# Patient Record
Sex: Male | Born: 1945 | Race: White | Hispanic: No | Smoking: Former smoker
Health system: Southern US, Community
[De-identification: ages and names within clinical notes are randomized; demographics above are authoritative.]

## PROBLEM LIST (undated history)

## (undated) DIAGNOSIS — H3530 Unspecified macular degeneration: Secondary | ICD-10-CM

## (undated) DIAGNOSIS — Z860100 Personal history of colon polyps, unspecified: Secondary | ICD-10-CM

## (undated) DIAGNOSIS — R3911 Hesitancy of micturition: Secondary | ICD-10-CM

## (undated) DIAGNOSIS — M549 Dorsalgia, unspecified: Secondary | ICD-10-CM

## (undated) DIAGNOSIS — Z9889 Other specified postprocedural states: Secondary | ICD-10-CM

## (undated) DIAGNOSIS — N400 Enlarged prostate without lower urinary tract symptoms: Secondary | ICD-10-CM

## (undated) DIAGNOSIS — Z8719 Personal history of other diseases of the digestive system: Secondary | ICD-10-CM

## (undated) DIAGNOSIS — I480 Paroxysmal atrial fibrillation: Secondary | ICD-10-CM

## (undated) DIAGNOSIS — I499 Cardiac arrhythmia, unspecified: Secondary | ICD-10-CM

## (undated) DIAGNOSIS — I2510 Atherosclerotic heart disease of native coronary artery without angina pectoris: Secondary | ICD-10-CM

## (undated) DIAGNOSIS — G4730 Sleep apnea, unspecified: Secondary | ICD-10-CM

## (undated) DIAGNOSIS — M62830 Muscle spasm of back: Secondary | ICD-10-CM

## (undated) DIAGNOSIS — Z8711 Personal history of peptic ulcer disease: Secondary | ICD-10-CM

## (undated) DIAGNOSIS — G629 Polyneuropathy, unspecified: Secondary | ICD-10-CM

## (undated) DIAGNOSIS — M5030 Other cervical disc degeneration, unspecified cervical region: Secondary | ICD-10-CM

## (undated) DIAGNOSIS — K5900 Constipation, unspecified: Secondary | ICD-10-CM

## (undated) DIAGNOSIS — I4891 Unspecified atrial fibrillation: Secondary | ICD-10-CM

## (undated) DIAGNOSIS — T148XXA Other injury of unspecified body region, initial encounter: Secondary | ICD-10-CM

## (undated) DIAGNOSIS — G4700 Insomnia, unspecified: Secondary | ICD-10-CM

## (undated) DIAGNOSIS — M1990 Unspecified osteoarthritis, unspecified site: Secondary | ICD-10-CM

## (undated) DIAGNOSIS — R112 Nausea with vomiting, unspecified: Secondary | ICD-10-CM

## (undated) DIAGNOSIS — R7303 Prediabetes: Secondary | ICD-10-CM

## (undated) DIAGNOSIS — M2540 Effusion, unspecified joint: Secondary | ICD-10-CM

## (undated) DIAGNOSIS — M2550 Pain in unspecified joint: Secondary | ICD-10-CM

## (undated) DIAGNOSIS — G8929 Other chronic pain: Secondary | ICD-10-CM

## (undated) DIAGNOSIS — I82409 Acute embolism and thrombosis of unspecified deep veins of unspecified lower extremity: Secondary | ICD-10-CM

## (undated) DIAGNOSIS — K219 Gastro-esophageal reflux disease without esophagitis: Secondary | ICD-10-CM

## (undated) DIAGNOSIS — R531 Weakness: Secondary | ICD-10-CM

## (undated) HISTORY — PX: APPENDECTOMY: SHX54

## (undated) HISTORY — PX: EYE SURGERY: SHX253

## (undated) HISTORY — PX: POSTERIOR LUMBAR FUSION: SHX6036

## (undated) HISTORY — PX: JOINT REPLACEMENT: SHX530

## (undated) HISTORY — PX: CARDIAC CATHETERIZATION: SHX172

## (undated) HISTORY — PX: SHOULDER ARTHROSCOPY: SHX128

## (undated) HISTORY — DX: Atherosclerotic heart disease of native coronary artery without angina pectoris: I25.10

## (undated) HISTORY — PX: CARPAL TUNNEL RELEASE: SHX101

## (undated) HISTORY — PX: ANTERIOR CERVICAL DECOMP/DISCECTOMY FUSION: SHX1161

## (undated) HISTORY — PX: ANTERIOR CERVICAL DISCECTOMY: SHX1160

## (undated) HISTORY — PX: BACK SURGERY: SHX140

---

## 2002-12-28 ENCOUNTER — Encounter: Payer: Self-pay | Admitting: Emergency Medicine

## 2002-12-28 ENCOUNTER — Emergency Department (HOSPITAL_COMMUNITY): Admission: EM | Admit: 2002-12-28 | Discharge: 2002-12-28 | Payer: Self-pay | Admitting: Emergency Medicine

## 2003-01-06 ENCOUNTER — Ambulatory Visit (HOSPITAL_COMMUNITY): Admission: RE | Admit: 2003-01-06 | Discharge: 2003-01-06 | Payer: Self-pay | Admitting: Family Medicine

## 2003-01-06 ENCOUNTER — Encounter: Payer: Self-pay | Admitting: Family Medicine

## 2004-11-24 ENCOUNTER — Emergency Department (HOSPITAL_COMMUNITY): Admission: EM | Admit: 2004-11-24 | Discharge: 2004-11-24 | Payer: Self-pay | Admitting: Emergency Medicine

## 2005-02-18 ENCOUNTER — Ambulatory Visit (HOSPITAL_COMMUNITY): Admission: RE | Admit: 2005-02-18 | Discharge: 2005-02-18 | Payer: Self-pay | Admitting: Family Medicine

## 2005-02-18 ENCOUNTER — Emergency Department (HOSPITAL_COMMUNITY): Admission: AD | Admit: 2005-02-18 | Discharge: 2005-02-18 | Payer: Self-pay | Admitting: Family Medicine

## 2005-08-21 ENCOUNTER — Encounter: Admission: RE | Admit: 2005-08-21 | Discharge: 2005-08-21 | Payer: Self-pay | Admitting: Cardiovascular Disease

## 2005-08-27 ENCOUNTER — Ambulatory Visit (HOSPITAL_COMMUNITY): Admission: RE | Admit: 2005-08-27 | Discharge: 2005-08-27 | Payer: Self-pay | Admitting: Cardiovascular Disease

## 2005-09-17 ENCOUNTER — Ambulatory Visit (HOSPITAL_COMMUNITY): Admission: RE | Admit: 2005-09-17 | Discharge: 2005-09-17 | Payer: Self-pay | Admitting: Family Medicine

## 2005-10-12 ENCOUNTER — Encounter: Admission: RE | Admit: 2005-10-12 | Discharge: 2005-10-12 | Payer: Self-pay | Admitting: Neurosurgery

## 2005-11-07 ENCOUNTER — Ambulatory Visit (HOSPITAL_COMMUNITY): Admission: RE | Admit: 2005-11-07 | Discharge: 2005-11-07 | Payer: Self-pay | Admitting: Neurosurgery

## 2005-11-18 ENCOUNTER — Emergency Department (HOSPITAL_COMMUNITY): Admission: EM | Admit: 2005-11-18 | Discharge: 2005-11-18 | Payer: Self-pay | Admitting: Emergency Medicine

## 2005-12-12 ENCOUNTER — Inpatient Hospital Stay (HOSPITAL_COMMUNITY): Admission: RE | Admit: 2005-12-12 | Discharge: 2005-12-15 | Payer: Self-pay | Admitting: Neurosurgery

## 2005-12-21 ENCOUNTER — Encounter: Admission: RE | Admit: 2005-12-21 | Discharge: 2005-12-21 | Payer: Self-pay | Admitting: Neurosurgery

## 2006-11-18 ENCOUNTER — Ambulatory Visit: Payer: Self-pay | Admitting: Oncology

## 2007-04-17 ENCOUNTER — Encounter: Admission: RE | Admit: 2007-04-17 | Discharge: 2007-04-17 | Payer: Self-pay | Admitting: Neurosurgery

## 2008-02-27 ENCOUNTER — Inpatient Hospital Stay (HOSPITAL_COMMUNITY): Admission: RE | Admit: 2008-02-27 | Discharge: 2008-02-29 | Payer: Self-pay | Admitting: Neurosurgery

## 2009-01-13 ENCOUNTER — Inpatient Hospital Stay (HOSPITAL_COMMUNITY): Admission: AD | Admit: 2009-01-13 | Discharge: 2009-01-17 | Payer: Self-pay | Admitting: Orthopaedic Surgery

## 2011-04-02 LAB — CBC
HCT: 36.5 % — ABNORMAL LOW (ref 39.0–52.0)
HCT: 40.6 % (ref 39.0–52.0)
Hemoglobin: 12.7 g/dL — ABNORMAL LOW (ref 13.0–17.0)
Hemoglobin: 13.9 g/dL (ref 13.0–17.0)
MCHC: 34.2 g/dL (ref 30.0–36.0)
MCHC: 34.9 g/dL (ref 30.0–36.0)
MCV: 92.3 fL (ref 78.0–100.0)
MCV: 93.6 fL (ref 78.0–100.0)
Platelets: 287 10*3/uL (ref 150–400)
Platelets: 293 10*3/uL (ref 150–400)
RBC: 3.95 MIL/uL — ABNORMAL LOW (ref 4.22–5.81)
RBC: 4.33 MIL/uL (ref 4.22–5.81)
RDW: 12.1 % (ref 11.5–15.5)
RDW: 12.3 % (ref 11.5–15.5)
WBC: 5.6 10*3/uL (ref 4.0–10.5)
WBC: 6 10*3/uL (ref 4.0–10.5)

## 2011-04-02 LAB — BASIC METABOLIC PANEL
BUN: 10 mg/dL (ref 6–23)
BUN: 16 mg/dL (ref 6–23)
BUN: 7 mg/dL (ref 6–23)
CO2: 26 mEq/L (ref 19–32)
CO2: 27 mEq/L (ref 19–32)
CO2: 27 mEq/L (ref 19–32)
Calcium: 9 mg/dL (ref 8.4–10.5)
Calcium: 9.5 mg/dL (ref 8.4–10.5)
Calcium: 9.5 mg/dL (ref 8.4–10.5)
Chloride: 102 mEq/L (ref 96–112)
Chloride: 102 mEq/L (ref 96–112)
Chloride: 106 mEq/L (ref 96–112)
Creatinine, Ser: 0.64 mg/dL (ref 0.4–1.5)
Creatinine, Ser: 0.72 mg/dL (ref 0.4–1.5)
Creatinine, Ser: 0.75 mg/dL (ref 0.4–1.5)
GFR calc Af Amer: >60 mL/min (ref >60)
GFR calc Af Amer: >60 mL/min (ref >60)
GFR calc Af Amer: >60 mL/min (ref >60)
GFR calc non Af Amer: >60 mL/min (ref >60)
GFR calc non Af Amer: >60 mL/min (ref >60)
GFR calc non Af Amer: >60 mL/min (ref >60)
Glucose, Bld: 103 mg/dL — ABNORMAL HIGH (ref 70–99)
Glucose, Bld: 108 mg/dL — ABNORMAL HIGH (ref 70–99)
Glucose, Bld: 144 mg/dL — ABNORMAL HIGH (ref 70–99)
Potassium: 3.7 mEq/L (ref 3.5–5.1)
Potassium: 3.8 mEq/L (ref 3.5–5.1)
Potassium: 3.8 mEq/L (ref 3.5–5.1)
Sodium: 139 mEq/L (ref 135–145)
Sodium: 139 mEq/L (ref 135–145)
Sodium: 141 mEq/L (ref 135–145)

## 2011-04-02 LAB — DIFFERENTIAL
Basophils Absolute: 0 10*3/uL (ref 0.0–0.1)
Basophils Absolute: 0 10*3/uL (ref 0.0–0.1)
Basophils Relative: 0 % (ref 0–1)
Basophils Relative: 0 % (ref 0–1)
Eosinophils Absolute: 0 10*3/uL (ref 0.0–0.7)
Eosinophils Absolute: 0.1 10*3/uL (ref 0.0–0.7)
Eosinophils Relative: 0 % (ref 0–5)
Eosinophils Relative: 2 % (ref 0–5)
Lymphocytes Relative: 22 % (ref 12–46)
Lymphocytes Relative: 6 % — ABNORMAL LOW (ref 12–46)
Lymphs Abs: 0.4 10*3/uL — ABNORMAL LOW (ref 0.7–4.0)
Lymphs Abs: 1.3 10*3/uL (ref 0.7–4.0)
Monocytes Absolute: 0.3 10*3/uL (ref 0.1–1.0)
Monocytes Absolute: 0.6 10*3/uL (ref 0.1–1.0)
Monocytes Relative: 11 % (ref 3–12)
Monocytes Relative: 5 % (ref 3–12)
Neutro Abs: 3.6 10*3/uL (ref 1.7–7.7)
Neutro Abs: 5.3 10*3/uL (ref 1.7–7.7)
Neutrophils Relative %: 65 % (ref 43–77)
Neutrophils Relative %: 89 % — ABNORMAL HIGH (ref 43–77)

## 2011-04-02 LAB — URINALYSIS, ROUTINE W REFLEX MICROSCOPIC
Bilirubin Urine: NEGATIVE
Glucose, UA: NEGATIVE mg/dL
Hgb urine dipstick: NEGATIVE
Ketones, ur: NEGATIVE mg/dL
Nitrite: NEGATIVE
Protein, ur: NEGATIVE mg/dL
Specific Gravity, Urine: >1.03 — ABNORMAL HIGH (ref 1.005–1.030)
Urobilinogen, UA: 0.2 mg/dL (ref 0.0–1.0)
pH: 5.5 (ref 5.0–8.0)

## 2011-04-02 LAB — SEDIMENTATION RATE: Sed Rate: 59 mm/hr — ABNORMAL HIGH (ref 0–16)

## 2011-04-02 LAB — VANCOMYCIN, RANDOM: Vancomycin Rm: 5.6 ug/mL

## 2011-05-01 NOTE — Op Note (Signed)
[name], Christopher [name]                 ACCOUNT NO.:  [id number]   MEDICAL RECORD NO.:  [id number]          PATIENT TYPE:  OIB   LOCATION:  3599                         FACILITY:  [facility]   PHYSICIAN:  Hilda Lias, M.D.   DATE OF BIRTH:  [birth year]   DATE OF PROCEDURE:  02/27/2008  DATE OF DISCHARGE:                               OPERATIVE REPORT   PREOPERATIVE DIAGNOSIS:  C3-C4 degenerative disk disease with stenosis  and chronic radiculopathy status post fusion of C4 down to C7.   POSTOPERATIVE DIAGNOSIS:  C3-C4 degenerative disk disease with stenosis  and chronic radiculopathy status post fusion of C4 down to C7.   PROCEDURE:  Anterior C3-C4 diskectomy, decompression of the spinal cord,  foraminotomy, interbody fusion with allograft and autograft plate,  microscopy.   SURGEON:  Hilda Lias, M.D.   ASSISTANT:  Payton Doughty, M.D.   CLINICAL HISTORY:  The patient is admitted because of neck pain  radiating into the shoulder.  Clinically, he has no weakness but pain  with movement of the shoulder.  X-ray showed fusion from C4 down to C6-7  with severe degenerative disk disease at the level of 3-4 with bilateral  foraminal stenosis.  Surgery was advised, and the risks were explained  to him  in the history and physical.   PROCEDURE:  The patient was taken to the OR and after intubation, the  left side of the neck was cleaned with DuraPrep.  A transverse incision  was done through the skin, subcutaneous tissue, and platysma down to the  cervical spine.  X-rays showed that we were at the C3-C4.  With the  microscope, we opened the anterior ligament and total diskectomy was  achieved.  The area was quite narrow.  The posterior ligament was  adherent to the dura mater, and lysis was achieved.  At the end, we were  able to work between the posterior ligament and the dura mater doing  decompression of the spinal cord as well as foraminotomy.  The endplates  were drilled, and allograft  with autograft in the middle was inserted.  Then a plate using four screws was done.  The lateral cervical spine  showed good position of the bone graft and the plate.  Although we had  good hemostasis, nevertheless, because he had previous surgery and  multiple scars, a drain was left in the precervical area.  The wound was  closed with Vicryl and Steri-Strips.           ______________________________  Hilda Lias, M.D.     EB/MEDQ  D:  02/27/2008  T:  02/28/2008  Job:  [id number]

## 2011-05-01 NOTE — H&P (Signed)
Christopher [name], Christopher [name]                 ACCOUNT NO.:  [id number]   MEDICAL RECORD NO.:  [id number]          PATIENT TYPE:  INP   LOCATION:  A305                          FACILITY:  [facility]   PHYSICIAN:  J. Darreld Mclean, M.D. DATE OF BIRTH:  [birth year]   DATE OF ADMISSION:  01/13/2009  DATE OF DISCHARGE:  LH                              HISTORY & PHYSICAL   CHIEF COMPLAINT:  My elbow is swollen, it hurts.   The patient is a 65 year old male with pain and tenderness in his right  elbow.  I first saw him in the office on January 12, 2009,  at the  request of Dr. Wende Crease.  The patient has swelling in his elbow over the  last weekend and was seen by Dr. Wende Crease on January 10, 2009.  __________ cellulitis of the elbow and bursitis and was given IV  Rocephin.  He returned the following day and again, the next day and he  had asked me to see the patient.  The redness has gotten better and he  had been placed on Augmentin.  The patient was afebrile and a full range  of motion.  He had some tenderness to the elbow.  He had obvious  effusion of the olecranon bursa and aspirated approximately 3-4 mL and  sent that for culture.  Following morning on the day of admission, the  patient was having nausea and complaining of a fever, had a temperature  of 100.4, increased redness to his elbow and pain.  I saw him in the  office.  He was having significant nausea as well.  He could not take  his p.o. medication.  I elected to admit him.  I offered outpatient  therapy, but we are going to be having rather significant snowstorm  beginning tomorrow and it will be very difficult for him to get  transportation back and forth to the hospital.  He called earlier this  morning to the office complaining about his elbow hurting more, but it  took him a considerable amount of time to get to the office, in fact it  was late afternoon before he could come in to get a ride.  It would be  safer and better to have him  admitted to the hospital for IV  antibiotics.  This has not improved with conservative treatment.   PREVIOUS SURGERIES:  He had neck surgery in 2007 and 2008, upper back  surgery in 1991, bilateral carpal tunnel in 2008 and 2009, and  appendectomy in 1958.   He has no allergies.   CURRENT MEDICATIONS:  Nexium and ibuprofen, but he also has taken Mobic  in the past.   The patient does not smoke and he uses alcoholic beverages socially.   He has a history of ulcerative disease and circulatory problems.   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  Diabetes run in the family.  He is  married and lives in [place].   PHYSICAL EXAMINATION:  VITAL SIGNS:  BP is 100/78, height 5 feet 7,  weight 157, pulse 68, respirations 16, and a temperature of  100.7.  GENERAL:  He is alert, cooperative, and oriented.  HEENT:  Negative.  NECK:  Supple.  LUNGS: Clear to P&A.  HEART:  Regular rhythm without murmur heard.  ABDOMEN: Soft, nontender.  EXTREMITIES:  Right elbow is kind of swollen and tender, olecranon bursa  with redness.  Range of motion of the elbow is full, but I had to coax  some to get full extension.  Supination, pronation as well but I have to  coax him to do that.  There is redness around the olecranon area on the  proximal forearm and the lower distal arm.  Other extremities are  negative.  CNS: Intact.  SKIN:  Intact.   IMPRESSION:  Olecranon bursitis and cellulitis of the right elbow.   PLAN:  Admission, IV antibiotics.  I talked to him and his wife that he  will be in hospital for several days until we get the infection cleared.  Also treat him for his nausea.  Other labs are pending.                                            ______________________________  J. Darreld Mclean, M.D.     JWK/MEDQ  D:  01/14/2009  T:  01/14/2009  Job:  [id number]

## 2011-05-01 NOTE — H&P (Signed)
NAMEWILMAR, PRABHAKAR                 ACCOUNT NO.:  [id number]   MEDICAL RECORD NO.:  [id number]          PATIENT TYPE:  OIB   LOCATION:  3599                         FACILITY:  [facility]   PHYSICIAN:  Hilda Lias, M.D.   DATE OF BIRTH:  12-17-1946   DATE OF ADMISSION:  02/27/2008  DATE OF DISCHARGE:                              HISTORY & PHYSICAL   Mr. Mitton is a gentleman who in the past underwent anterior cervical  diskectomy because of spondylosis.  The patient had surgery which  involved fusion at the level of C4-5, C5-6 and C6-7.  The patient had  been doing fairly well but lately he had been complaining of neck pain  with radiation to the shoulder.  There is no weakness.  The pain is not  any better.  We did conservative treatment without any improvement.  Because of that we went ahead and we did some x-rays, which showed that  indeed he has a severe case of degenerative disk disease at the level of  C3-4.  There was also narrowing of the foramen at the level of C7-T1 but  the patient does not have any C8 radiculopathy.   PAST MEDICAL HISTORY:  1. Anterior cervical fusion.  2. Carpal tunnel surgery.   The patient is not allergic to medication except MORPHINE.   He does not smoke.  He drinks socially.   FAMILY HISTORY:  Unremarkable.   REVIEW OF SYSTEMS:  Positive for neck pain.   PHYSICAL EXAMINATION:  HEAD, EARS, NOSE AND THROAT:  Normal.  NECK:  There is a limitation of flexibility with anterior scar.  LUNGS:  Clear.  Heart sounds normal.  EXTREMITIES:  Normal pulses.  NEUROLOGIC:  Mental status and cranial nerves are normal.  Strength is  normal.  Coordination normal.   Cervical spine x-rays show severe degenerative disk disease with  spondylosis and a herniated disk at the level of C3-4.  The fusion  between C3-4, C4-5 and C6-7 is solid.   CLINICAL IMPRESSION:  C3-4 spondylosis and stenosis.   RECOMMENDATIONS:  The patient is being admitted for surgery.  The  procedure will be anterior cervical diskectomy at the level of C3-4  using allograft at his own request.  He knows about the risks such as  infection, CSF leak, worsening pain and difficulty swallowing and need  for further surgery, which may require a posterior approach.           ______________________________  Hilda Lias, M.D.     EB/MEDQ  D:  02/27/2008  T:  02/28/2008  Job:  [id number]

## 2011-05-04 NOTE — Op Note (Signed)
[name], Christopher [name]                 ACCOUNT NO.:  [id number]   MEDICAL RECORD NO.:  [id number]          PATIENT TYPE:  INP   LOCATION:  3008                         FACILITY:  [facility]   PHYSICIAN:  Hilda Lias, M.D.   DATE OF BIRTH:  December 10, 1946   DATE OF PROCEDURE:  12/12/2005  DATE OF DISCHARGE:                                 OPERATIVE REPORT   PREOPERATIVE DIAGNOSIS:  C5-C6 pseudoarthrosis, C6-C7 stenosis and  spondylosis with chronic radiculopathy, left carpal tunnel syndrome.   POSTOPERATIVE DIAGNOSIS:  C5-C6 pseudoarthrosis, C6-C7 stenosis and  spondylosis with chronic radiculopathy, left carpal tunnel syndrome.   PROCEDURE:  1.  Anterior C5-C6 decompression and interbody fusion, C6-C7 discectomy,      decompression of the spinal cord, interbody fusion plate from C5 to C7.  2.  Left carpal tunnel decompression.   ANESTHESIA:  General.   SURGEON:  Hilda Lias, M.D.   ASSISTANT:  Clydene Fake, M.D., for the first procedure.   CLINICAL HISTORY:  The patient was admitted because of neck pain with  radiation to both upper extremities.  Many years ago, he underwent fusion of  5-6 and 4-5.  4-5 healed completely but at the level of 5-6 he developed  pseudoarthrosis.  X-ray shows pseudoarthrosis at the level of 5-6 with  spondylosis and stenosis below C6 and C7.  Previously, he had decompression  of the right median nerve about six weeks ago and now he has come in to have  not only the neck but also the hand.  The risks were explained during the  history and physical.   PROCEDURE:  The patient was taken to the OR and after intubation, the neck  was prepped.  A longitudinal incision following the previous one was made,  retraction was carried out.  The patient had quite a bit of adhesions and  lysis was accomplished.  X-ray showed that we were at the level of 5-6.  Then, with the microscope, we opened the anterior ligament of 5-6 and 6-7.  At the level of C6-C7, we did a  total discectomy with decompression of the  spinal cord and doing foraminotomy.  The patient had quite a bit of  adhesions but the worse was at the level of 5-6 where there was a lot of  scar tissue.  At the end, we had a good decompression of the C6 nerve root  bilaterally.  There was an area that CSF was coming when we tried to dissect  the dura matter.  Although the CSF stopped at the end of the procedure,  nevertheless, we used BioGlue.  From then on, the endplate was drilled.  Two  allograft with autograft inside were inserted.  Once was 6 mm between 5-6  and 7 mm between 6-7.  This was followed by the plate with screws.  X-ray  showed good alignment and good position of the graft of the screws and the  plate.  From then on, the area was irrigated.  Since we had quite a bit of  dissection, I used H2O2 through NG tube into the esophagus,  there was no  evidence of any leakage in the esophagus.  We waited ten minutes, we were  sure there was not any bleeding.  The area was irrigated and the wound was  closed with Vicryl and Steri-Strips.   We  now proceeded with the second procedure.  The left hand was prepped up  to the elbow.  An incision following the base of the thumb was made through  the skin, through the volar ligament, revealing a thick, calcified ligament.  The carpal ligament was dissected along the ulnar aspect of the nerve distal  and proximal.  At the end, we had a good decompression of the median nerve.  The area was irrigated, hemostasis was done with bipolar, and the wound was  closed with nylon.           ______________________________  Hilda Lias, M.D.     EB/MEDQ  D:  12/12/2005  T:  12/12/2005  Job:  [id number]

## 2011-05-04 NOTE — H&P (Signed)
Christopher [name], Christopher [name]                 ACCOUNT NO.:  [id number]   MEDICAL RECORD NO.:  [id number]          PATIENT TYPE:  INP   LOCATION:  2869                         FACILITY:  [facility]   PHYSICIAN:  Hilda Lias, M.D.   DATE OF BIRTH:  Feb 14, 1946   DATE OF ADMISSION:  12/12/2005  DATE OF DISCHARGE:                                HISTORY & PHYSICAL   [name] is a gentleman who came to see me because of neck pain with  radiation to both upper extremities.  The work-up showed that he had  degenerative disk disease as well as carpal tunnel syndrome bilaterally.  Previously he had effusion at the level 5-6 and at the level 4-5.  He  underwent decompression of the right median nerve and now he is ready to go  ahead with the cervical as well as the left carpal tunnel syndrome repair to  be done all in one setting.   PAST MEDICAL HISTORY:  Anterior cervical diskectomy 5-6 and 4-5.   He is not allergic to any medications.   He drinks socially.  Does not smoke.   FAMILY HISTORY:  Unremarkable.   PHYSICAL EXAMINATION:  GENERAL:  Patient came to my office and had  difficulty with the left hand, he kept shaking the left hand, and also with  neck pain.  HEENT:  Normal.  NECK:  There is a scar anteriorly.  He has had decrease of flexibility of  cervical spine.  LUNGS:  Clear.  HEART:  Normal.  ABDOMEN:  Normal.  EXTREMITIES:  There is a scar on the right hand from previous surgery.  NEUROLOGIC:  Mental status normal.  Cranial nerves normal.  Strength:  I can  break both biceps and a little bit the left triceps.  Reflexes 1+.  No  Babinski.   The cervical x-ray showed that he has spondylosis which is at the level 5-  6/6-7.  The EMG and nerve conduction tests show that indeed he has a chronic  C7 radiculopathy.   CLINICAL IMPRESSION:  Cervical spondylosis 5-6, 6-7.  Left carpal tunnel  syndrome.   RECOMMENDATIONS:  The patient is being admitted for surgery.  The procedure  will be a  decompression at the level 5-6, 6-7 and to follow with  decompression of the median nerve.  He knows all the risks such as  infection, CSF leak, worsening pain, paralysis, damage to the vocal cord,  and no improvement whatsoever because of the chronicity of the pain.           ______________________________  Hilda Lias, M.D.     EB/MEDQ  D:  12/12/2005  T:  12/12/2005  Job:  [id number]

## 2011-05-04 NOTE — Cardiovascular Report (Signed)
Christopher [name], Christopher [name]                 ACCOUNT NO.:  [id number]   MEDICAL RECORD NO.:  [id number]          PATIENT TYPE:  AMB   LOCATION:  SDS                          FACILITY:  [facility]   PHYSICIAN:  Nanetta Batty, M.D.   DATE OF BIRTH:  1946-05-16   DATE OF PROCEDURE:  08/27/2005  DATE OF DISCHARGE:                              CARDIAC CATHETERIZATION   Mr. Lybrand is a 65 year old Caucasian male referred for chest and arm pain by  Dr. Metta Clines.  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  Cardiac stress test revealed no evidence of ischemia and  he had normal LV function by two-dimensional echocardiogram.  He presents  now for arch angiography, distal abdominal aortography, and selective right  subclavian artery angiography.   DESCRIPTION OF PROCEDURE:  The patient is brought to the sixth floor [facility] in the postabsorptive state.  He  was premedicated with p.o. Valium.  His right groin was prepped and shaved  in the usual sterile fashion.  1% Xylocaine was used for local anesthesia.  A 5 French sheath was inserted into the right femoral artery using standard  Seldinger technique.  5 [place] tennis-racquet catheter as well as JV1  catheter were used for arch angiography, distal abdominal aortography, and  selective right subclavian artery angiography.  Visipaque dye was used for  the entirety of the case.  Retrograde aortic pressure was monitored at the  end of the case.   1.  Arch aortogram; Type 1 arch without evidence of arch vessel disease.  2.  Right subclavian artery; widely patent.  3.  Abdominal aortogram;      1.  Renal arteries - normal.      2.  Infrarenal abdominal aorta and iliac bifurcation were free of          significant disease.   IMPRESSION:  Mr. Lina has normal right subclavian artery and abdominal aorta  with iliac  arteries.  I am unsure why his right upper extremity is weak and  painful as well as the fact that he had an abnormal Doppler study.  However,  his angiogram is completely unremarkable.  Plans will be for continued  medical therapy.   The sheath was removed and pressure was held in the groin to achieve  hemostasis.  The patient left the lab in stable condition.  He will be  discharged home today as an outpatient after being hydrated and will see me  back in the office in several weeks for follow-up.      Nanetta Batty, M.D.  Electronically Signed    JB/MEDQ  D:  08/27/2005  T:  08/27/2005  Job:  [id number]   cc:   [facility]   Kemper Durie, M.D., [place] [facility]

## 2011-05-04 NOTE — Op Note (Signed)
NAMECARSYN, TAUBMAN                 ACCOUNT NO.:  [id number]   MEDICAL RECORD NO.:  [id number]          PATIENT TYPE:  AMB   LOCATION:  SDS                          FACILITY:  [facility]   PHYSICIAN:  Hilda Lias, M.D.   DATE OF BIRTH:  [birth year]   DATE OF PROCEDURE:  11/07/2005  DATE OF DISCHARGE:  11/07/2005                                 OPERATIVE REPORT   PREOPERATIVE DIAGNOSIS:  Right carpal tunnel syndrome.   POSTOPERATIVE DIAGNOSIS:  Right carpal tunnel syndrome.   PROCEDURE:  Decompression of the right median nerve.   CLINICAL HISTORY:  The patient was admitted because of arm pain associated  with weakness.  The patient has a positive nerve conduction test.  Surgery  was advised.  The risks were explained to him, including the possibility of  infection and no improvement whatsoever.   PROCEDURE:  The patient was taken to the OR, and the right hand was prepped  with Betadine.  Infiltration at the base of the thumb was made.  An in  incision was done following the base line of the thumb.  It was carried out  through the skin, the volar ligament, and through a thick, calcified  ligament.  Decompression medial and distal along the ulnar aspect of the  nerve was accomplished.  At the end we have a good plenty of space of the  median nerve.  Hemostasis was done.  The area was irrigated.  The wound was  closed with nylon and the patient is going to go to PACU and discharged once  he is stable.           ______________________________  Hilda Lias, M.D.     EB/MEDQ  D:  11/07/2005  T:  11/07/2005  Job:  [id number]

## 2011-05-04 NOTE — Discharge Summary (Signed)
NAMEMILO, Christopher [name]                 ACCOUNT NO.:  [id number]   MEDICAL RECORD NO.:  [id number]          PATIENT TYPE:  INP   LOCATION:  A305                          FACILITY:  [facility]   PHYSICIAN:  J. Darreld Mclean, M.D. DATE OF BIRTH:  08-Aug-1946   DATE OF ADMISSION:  01/13/2009  DATE OF DISCHARGE:  02/01/2010LH                               DISCHARGE SUMMARY   DISCHARGE DIAGNOSES:  1. Cellulitis of the right olecranon.  2. Right olecranon bursitis.   PROCEDURE PERFORMED:  Placement of a PICC line.   DISCHARGE MEDICATIONS:  1. Vancomycin daily IV per therapy scheduling and dosing.  2. Mobic 50 mg daily.  3. Nexium 40 mg daily.   He is to be seen by me in my office on Thursday after discharge.   DISCHARGE STATUS:  Improved.   PROGNOSIS:  Good.   Patient developed cellulitis of his right elbow.  He was seen by Dr.  Chilton Si on January 10, 2009, and then again on January 12, 2009.  I saw  the patient on January 12, 2009 at the request of Dr. Chilton Si.  Initially,  the wound looked good but it got redder and redder and looked to have  him admitted because he was not responding to p.o. antibiotics.  Patient  was admitted and a PICC line inserted.  Vancomycin was given.  After the  PICC line was inserted, the following day he had less erythema, however,  we had 8 inches of snow and he had no way to go home.  Patient stayed in  the hospital on continuation of his IV antibiotics for the next couple  days and was discharged home on January 17, 2009.  I aspirated 3 mL from  the elbow at that time.  Cultures came back growing Staph aureus.  Other  labs were negative.  We will see the patient as stated.  If he gets  worse, any difficulties, contact me through office hospital beeper  system.                                            ______________________________  Shela Commons. Darreld Mclean, M.D.     JWK/MEDQ  D:  02/01/2009  T:  02/01/2009  Job:  [id number]

## 2011-05-04 NOTE — Discharge Summary (Signed)
NAMESHEENA, [name]                 ACCOUNT NO.:  [id number]   MEDICAL RECORD NO.:  [id number]          PATIENT TYPE:  INP   LOCATION:  3008                         FACILITY:  [facility]   PHYSICIAN:  Hilda Lias, M.D.   DATE OF BIRTH:  [birth year]   DATE OF ADMISSION:  12/12/2005  DATE OF DISCHARGE:  12/15/2005                                 DISCHARGE SUMMARY   ADMISSION DIAGNOSES:  1.  C5-C6, C6-C7 degenerative disk disease with chronic radiculopathy.  2.  Left carpal tunnel syndrome.   POSTOPERATIVE DIAGNOSES:  1.  C5-C6, C6-C7 degenerative disk disease with chronic radiculopathy.  2.  Left carpal tunnel syndrome.   CLINICAL HISTORY:  The patient was admitted because of neck pain associated  with __________ and also carpal tunnel syndrome on the left side.  Previously, __________ he had surgery on the right hand.  The day of  admission the plan was to proceed with cervical decompression of the median  nerve.  Laboratory normal.   COURSE IN THE HOSPITAL:  The patient was taken to surgery and a 5-6, 6-7  fusion was done.  Also, the decompressed median nerve.  He has quite a bit  of scar tissue and there was a bit of CSF leak which was taken care of with  glue.  Nevertheless, today he is doing great, swallowing is much better, has  no headache and there is a bit of fluid collection on the left side of the  neck which I have been monitoring for 72 hours and he has been stable.  His  vital signs completely normal.  He is swallowing perfectly and the voice is  normal.  Patient wanted to go home.  He is going to be seen by me in 10 days  or before.   MEDICATIONS:  Darvocet and diazepam.   DIET:  Regular.   ACTIVITY:  Not to drive, not to do any heavy lifting.   FOLLOWUP:  He has an appointment to see me on November 26, 2005, although he  and his wife know that if there is any question to call me any time.           ______________________________  Hilda Lias, M.D.     EB/MEDQ  D:  12/15/2005  T:  12/16/2005  Job:  [id number]

## 2011-05-16 ENCOUNTER — Encounter: Payer: Self-pay | Admitting: Nurse Practitioner

## 2011-09-10 LAB — CBC
HCT: 38.5 — ABNORMAL LOW
Hemoglobin: 13.4
MCHC: 34.7
MCV: 90.5
Platelets: 247
RBC: 4.26
RDW: 12.5
WBC: 5.1

## 2011-09-10 LAB — HEPATIC FUNCTION PANEL
ALT: 24
AST: 22
Albumin: 4
Alkaline Phosphatase: 60
Bilirubin, Direct: <0.1
Total Bilirubin: 0.5
Total Protein: 6.1

## 2012-06-09 ENCOUNTER — Other Ambulatory Visit (HOSPITAL_COMMUNITY): Payer: Self-pay | Admitting: Orthopaedic Surgery

## 2012-06-09 DIAGNOSIS — M25511 Pain in right shoulder: Secondary | ICD-10-CM

## 2012-06-10 ENCOUNTER — Ambulatory Visit (HOSPITAL_COMMUNITY)
Admission: RE | Admit: 2012-06-10 | Discharge: 2012-06-10 | Disposition: A | Discharge status: Home / Self Care | Payer: Medicare Other | Source: Ambulatory Visit | Attending: Orthopaedic Surgery | Admitting: Orthopaedic Surgery

## 2012-06-10 DIAGNOSIS — M719 Bursopathy, unspecified: Secondary | ICD-10-CM | POA: Insufficient documentation

## 2012-06-10 DIAGNOSIS — M67919 Unspecified disorder of synovium and tendon, unspecified shoulder: Secondary | ICD-10-CM | POA: Insufficient documentation

## 2012-06-10 DIAGNOSIS — M25519 Pain in unspecified shoulder: Secondary | ICD-10-CM | POA: Insufficient documentation

## 2012-06-10 DIAGNOSIS — M19019 Primary osteoarthritis, unspecified shoulder: Secondary | ICD-10-CM | POA: Insufficient documentation

## 2012-06-10 DIAGNOSIS — M25511 Pain in right shoulder: Secondary | ICD-10-CM

## 2012-06-10 DIAGNOSIS — M259 Joint disorder, unspecified: Secondary | ICD-10-CM | POA: Insufficient documentation

## 2012-06-10 DIAGNOSIS — M898X9 Other specified disorders of bone, unspecified site: Secondary | ICD-10-CM | POA: Insufficient documentation

## 2012-07-09 ENCOUNTER — Ambulatory Visit (HOSPITAL_COMMUNITY)
Admission: RE | Admit: 2012-07-09 | Discharge: 2012-07-09 | Disposition: A | Discharge status: Home / Self Care | Payer: Medicare Other | Source: Ambulatory Visit | Attending: Orthopedic Surgery | Admitting: Orthopedic Surgery

## 2012-07-09 DIAGNOSIS — IMO0001 Reserved for inherently not codable concepts without codable children: Secondary | ICD-10-CM | POA: Insufficient documentation

## 2012-07-09 DIAGNOSIS — M6281 Muscle weakness (generalized): Secondary | ICD-10-CM | POA: Insufficient documentation

## 2012-07-09 DIAGNOSIS — M25519 Pain in unspecified shoulder: Secondary | ICD-10-CM | POA: Insufficient documentation

## 2012-07-09 NOTE — Evaluation (Addendum)
Occupational Therapy Evaluation  Patient Details  Name: Christopher [name] MRN: [id number] Date of Birth: [birth year]  Today's Date: 07/09/2012 Time: 1350-1430 OT Time Calculation (min): 40 min  Eval: 1350-1430 40' Visit#: 1  of 12   Re-eval: 08/06/12  Assessment Diagnosis: s/p R shoulder arthroscopy/SAD Surgical Date: 07/04/12 Next MD Visit: 08/15/12 Prior Therapy: None  Past Medical History: No past medical history on file. Past Surgical History: No past surgical history on file.  Subjective Symptoms/Limitations Symptoms: S: My shoulder and neck had been bothering me for about a year. They think surgery will fix it. Pertinent History: [name] has experienced neck and right shoulder pain for a year. He has a history of 3 previous neck surgeries, the most recent over 2 years ago. [name] had right shoulder arthroscopy and SAD on 07/04/12. He has been referred to occupational therapy for evaluation and treatment. 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 During BADLs that require him to lift RUE beyond waist level are more  difficult and cause discomfort Dominant Hand: Right Vision - History Baseline Vision: No visual deficits Perception Perception: Not tested Praxis Praxis: Not tested  Cognition/Observation Cognition Overall Cognitive Status: Appears within functional limits for tasks assessed Arousal/Alertness: Awake/alert Orientation Level: Oriented X4 Observation/Other Assessments Other Assessments: UEFI: 70/80 (88%)  Sensation/Coordination/Edema Sensation Light Touch: Appears Intact Stereognosis: Not tested Hot/Cold: Not tested Proprioception: Not tested Coordination Gross Motor Movements are Fluid and Coordinated: Yes Fine Motor Movements are Fluid and Coordinated: Yes  Additional Assessments RUE AROM (degrees) RUE Overall AROM Comments: AROM seated position, ext/int rot with arm adducted. Supine PROM WNL. Right Shoulder Flexion: 80 Degrees Right Shoulder ABduction: 50 Degrees Right Shoulder Internal Rotation: 60 Degrees Right Shoulder External Rotation: 70 Degrees LUE Assessment LUE Assessment: Within Functional Limits Palpation Palpation: Moderate fascial restrictions in anterior shoulder and upper arm region. Minimal fascial restriction in scapula and trapezius regions.          Occupational Therapy Assessment and Plan OT Assessment and Plan Clinical Impression Statement: A: 66 y/o male who presents with decreased AROM, strength and pain affecting the functional use of his RUE s/p R shoulder arthroscopy/SAD.  Pt will benefit from skilled therapeutic intervention in order to improve on the following deficits: Decreased activity tolerance;Decreased strength;Decreased range of motion;Increased fascial restricitons;Increased muscle spasms;Pain;Impaired UE functional use Rehab Potential: Excellent OT Frequency: Min 2X/week OT Duration: 6 weeks OT Treatment/Interventions: Self-care/ADL training;Therapeutic exercise;Therapeutic activities;Manual therapy;Modalities;Patient/family  education OT Plan: P: Skilled OT TX 2x per week to address decreased AROM, strength, functional use and increased pain. TX plan: MFR and manual stretching. AAROM in supine and seated, therapy ball, pulleys, scapular AROM, low wall wash, prot/ret/elev/dep.   Goals Short Term Goals Time to Complete Short Term Goals: 3  weeks Short Term Goal 1: Patient will be educated on HEP.  Short Term Goal 2: Patient will improve R AROM to [facility] for increased ability to put dished in upper kitchen cabinets. Short Term Goal 3: Right shoulder strength will be assessed. Short Term Goal 4: Patient will decrease fascial restrictions in R shoulder/upper arm regions from moderate to minimal. Short Term Goal 5: Patient will decrease pain to 2/10 when performing BADL tasks. Long Term Goals Time to Complete Long Term Goals: 6 weeks Long Term Goal 1: Patient will return to PLOF with all B/IADL, leisure and work tasks. Long Term Goal 2: Patient will have normal (5/5) strength in R shoulder.  Long Term Goal 3: Patient will have trace fascial restrictions in R shoulder/upper arm regions. 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  Problem List Patient Active Problem List  Diagnosis  . Pain in joint, shoulder region  . Muscle weakness (generalized)    End of Session Activity Tolerance: Patient tolerated treatment well General Behavior During Session: [facility] for tasks performed Cognition: [facility] for tasks performed OT Plan of Care OT Home Exercise Plan: Shoulder stretches OT Patient Instructions: Do HEP 1-2x per day Consulted and Agree with Plan of Care: Patient  GO  G code determined using UEFI current, used carrying grouping.  Initial gcode is CI goal is CH Armandina Stammer, OTS 07/09/2012, 4:22 PM  Physician Documentation Your signature is required to indicate approval of the treatment plan as stated above.  Please sign and either  send electronically or make a copy of this report for your files and return this physician signed original.  Please mark one 1.__approve of plan  2. ___approve of plan with the following conditions.   ______________________________                                                          _____________________ Physician Signature                                                                                                             Date

## 2012-07-09 NOTE — Evaluation (Signed)
Note reviewed by clinical instructor and accurately reflects treatment session.  Bethany H. Murray, OTR/L  

## 2012-07-17 ENCOUNTER — Ambulatory Visit (HOSPITAL_COMMUNITY)
Admission: RE | Admit: 2012-07-17 | Discharge: 2012-07-17 | Disposition: A | Discharge status: Home / Self Care | Payer: Medicare Other | Source: Ambulatory Visit | Attending: Family Medicine | Admitting: Family Medicine

## 2012-07-17 DIAGNOSIS — IMO0001 Reserved for inherently not codable concepts without codable children: Secondary | ICD-10-CM | POA: Insufficient documentation

## 2012-07-17 DIAGNOSIS — M6281 Muscle weakness (generalized): Secondary | ICD-10-CM

## 2012-07-17 DIAGNOSIS — M25519 Pain in unspecified shoulder: Secondary | ICD-10-CM | POA: Insufficient documentation

## 2012-07-17 NOTE — Progress Notes (Addendum)
Occupational Therapy Treatment Patient Details  Name: Christopher [name] MRN: [id number] Date of Birth: 12/04/1946  Today's Date: 07/17/2012 Time: 4098-1191 OT Time Calculation (min): 42 min Manual Therapy [phone] 23' Therapeutic Exercise 1004-1022   Visit#: 2  of 12   Re-eval: 08/06/12 Assessment Diagnosis: s/p R shoulder arthroscopy/SAD Surgical Date: 07/04/12 Next MD Visit: 08/15/12   Subjective Symptoms/Limitations Symptoms: S:  It hurts a little bit all the time but I work every day too. Pain Assessment Currently in Pain?: Yes Pain Score:   4 Pain Location: Shoulder Pain Orientation: Right  Precautions/Restrictions  Precautions Precautions: None  Exercise/Treatments   07/17/12 1000  Shoulder Exercises: Supine  Protraction PROM;AAROM;10 reps  Horizontal ABduction PROM;AAROM;10 reps  External Rotation PROM;AAROM;10 reps  Internal Rotation PROM;AAROM;10 reps  Flexion PROM;AAROM;10 reps  ABduction PROM;AAROM;10 reps  ABduction Limitations needed eccentric resistance to keep coming into adduction from being painful.  Shoulder Exercises: Seated  Elevation AROM;12 reps  Extension AROM;10 reps  Row AROM;10 reps  Shoulder Exercises: Pulleys  Flexion 2 minutes  ABduction 2 minutes  Shoulder Exercises: Therapy Ball  Flexion 20 reps  ABduction 20 reps          Manual Therapy Manual Therapy: Myofascial release Myofascial Release: MFR and manual stretching to right upper arm, upper trap and scapular area to decrease restrictions and increase pain free A/PROM.  Began scar massage.  Occupational Therapy Assessment and Plan OT Assessment and Plan Clinical Impression Statement: A:  Added multiple new exercises, AAROM supine, scapular AROM, ball stretches and pullies.  Patient required eccentric resistance with ABDuction AAROM ssecondary to pain.  Eccentric resistance diminished pain. OT Plan: P:  Add low wall wash and pro/ret/elev/dep.   Goals Short Term Goals Time to  Complete Short Term Goals: 3 weeks Short Term Goal 1: Patient will be educated on HEP.  Short Term Goal 2: Patient will improve R AROM to [facility] for increased ability to put dished in upper kitchen cabinets. Short Term Goal 3: Right shoulder strength will be assessed. Short Term Goal 4: Patient will decrease fascial restrictions in R shoulder/upper arm regions from moderate to minimal. Short Term Goal 5: Patient will decrease pain to 2/10 when performing BADL tasks. Long Term Goals Time to Complete Long Term Goals: 6 weeks Long Term Goal 1: Patient will return to PLOF with all B/IADL, leisure and work tasks. Long Term Goal 2: Patient will have normal (5/5) strength in R shoulder.  Long Term Goal 3: Patient will have trace fascial restrictions in R shoulder/upper arm regions. 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  Problem List Patient Active Problem List  Diagnosis  . Pain in joint, shoulder region  . Muscle weakness (generalized)    End of Session Activity Tolerance: Patient tolerated treatment well General Behavior During Session: [facility] for tasks performed Cognition: [facility] for tasks performed  GO    Noralee Stain, Jupiter Boys L 07/17/2012, 11:52 AM

## 2012-07-22 ENCOUNTER — Ambulatory Visit (HOSPITAL_COMMUNITY)
Admission: RE | Admit: 2012-07-22 | Discharge: 2012-07-22 | Disposition: A | Discharge status: Home / Self Care | Payer: Medicare Other | Source: Ambulatory Visit | Attending: Family Medicine | Admitting: Family Medicine

## 2012-07-22 DIAGNOSIS — M25519 Pain in unspecified shoulder: Secondary | ICD-10-CM

## 2012-07-22 DIAGNOSIS — M6281 Muscle weakness (generalized): Secondary | ICD-10-CM

## 2012-07-22 NOTE — Progress Notes (Addendum)
Occupational Therapy Treatment Patient Details  Name: Christopher [name] MRN: [id number] Date of Birth: [birth year]  Today's Date: 07/22/2012 Time: 4098-1191 OT Time Calculation (min): 43 min Manual Therapy [phone] 19' Therapeutic Exercise [phone] 23' Visit#: 3  of 12   Re-eval: 08/06/12 Assessment Diagnosis: s/p R shoulder arthroscopy/SAD Surgical Date: 07/04/12 Next MD Visit: 08/15/12  Subjective Symptoms/Limitations Symptoms: S:  It wakes me up at night but the other one hurts too. Pain Assessment Currently in Pain?: Yes Pain Score:   1 Pain Location: Shoulder Pain Orientation: Right  Precautions/Restrictions  Precautions Precautions: None  Exercise/Treatments Supine Protraction: PROM;AAROM;12 reps Horizontal ABduction: PROM;AAROM;12 reps External Rotation: PROM;AAROM;12 reps Internal Rotation: PROM;AAROM;12 reps Flexion: PROM;AAROM;12 reps ABduction: PROM;AAROM;12 reps ABduction Limitations: needed assist to support arm to prevent pain. Seated Elevation: AROM;12 reps Extension: AROM;12 reps Row: AROM;12 reps Pulleys Flexion:  (not available, resume) ABduction:  (not available, resume) Therapy Ball Flexion: 20 reps ABduction: 20 reps ROM / Strengthening / Isometric Strengthening Wall Wash: low 2' Prot/Ret//Elev/Dep: 1'         Manual Therapy Manual Therapy: Myofascial release Myofascial Release: MFR and manual stretching to right upper arm, upper trap and scapular area to decrease restrictions and increase pain free A/PROM  Occupational Therapy Assessment and Plan OT Assessment and Plan Clinical Impression Statement: A:  Added low wall wash and pro/ret/elev/dep.  Eccentric contraction not needed with abduction today but did need support throughout abduction to prevent increase in pain. OT Plan: P:  Increse reps with dowel in supine, attempt to give less support with abduction unless it continues to be painful.   Goals Short Term Goals Time to Complete  Short Term Goals: 3 weeks Short Term Goal 1: Patient will be educated on HEP.  Short Term Goal 2: Patient will improve R AROM to [facility] for increased ability to put dished in upper kitchen cabinets. Short Term Goal 3: Right shoulder strength will be assessed. Short Term Goal 4: Patient will decrease fascial restrictions in R shoulder/upper arm regions from moderate to minimal. Short Term Goal 5: Patient will decrease pain to 2/10 when performing BADL tasks. Long Term Goals Time to Complete Long Term Goals: 6 weeks Long Term Goal 1: Patient will return to PLOF with all B/IADL, leisure and work tasks. Long Term Goal 2: Patient will have normal (5/5) strength in R shoulder.  Long Term Goal 3: Patient will have trace fascial restrictions in R shoulder/upper arm regions. 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  Problem List Patient Active Problem List  Diagnosis  . Pain in joint, shoulder region  . Muscle weakness (generalized)    End of Session Activity Tolerance: Patient tolerated treatment well General Behavior During Session: [facility] for tasks performed Cognition: [facility] for tasks performed  GO    Noralee Stain, Caasi Giglia L 07/22/2012, 10:29 AM

## 2012-07-24 ENCOUNTER — Ambulatory Visit (HOSPITAL_COMMUNITY)
Admission: RE | Admit: 2012-07-24 | Discharge: 2012-07-24 | Disposition: A | Discharge status: Home / Self Care | Payer: Medicare Other | Source: Ambulatory Visit | Attending: Family Medicine | Admitting: Family Medicine

## 2012-07-24 DIAGNOSIS — M25519 Pain in unspecified shoulder: Secondary | ICD-10-CM

## 2012-07-24 DIAGNOSIS — M6281 Muscle weakness (generalized): Secondary | ICD-10-CM

## 2012-07-24 NOTE — Progress Notes (Signed)
Occupational Therapy Treatment Patient Details  Name: Christopher [name] MRN: [id number] Date of Birth: 02-13-1946  Today's Date: 07/24/2012 Time: 4098-1191 OT Time Calculation (min): 48 min Manual Therapy [phone] 20' Therapeutic Exercises [phone] 52'  Visit#: 4  of 12   Re-eval: 08/06/12    Subjective S:  That movement is tough, (external rotation with shoulder adducted), I think the girl did that with my my arm out to the side the other day, and it was really painful.   Pain Assessment Currently in Pain?: Yes Pain Score:   4 Pain Location: Shoulder Pain Orientation: Right Pain Type: Acute pain  Precautions/Restrictions   N/A  Exercise/Treatments Supine Protraction: PROM;10 reps;AAROM;15 reps Horizontal ABduction: PROM;10 reps;AAROM;15 reps External Rotation: PROM;10 reps;AAROM;15 reps External Rotation Limitations: minimal tactile cues to maintain proper positioning throughout range. Internal Rotation: PROM;10 reps;AAROM;15 reps Flexion: PROM;10 reps;AAROM;15 reps ABduction: PROM;10 reps;AAROM;15 reps ABduction Limitations: needed assist to support arm to prevent pain. Seated Elevation: AROM;15 reps Extension: AROM;15 reps Row: AROM;15 reps Protraction: AAROM;10 reps Horizontal ABduction: AAROM;10 reps External Rotation: AAROM;10 reps Internal Rotation: AAROM;10 reps Flexion: AAROM;10 reps Pulleys Flexion:  (dc - patient has full AAROM in flexion) ABduction: 2 minutes Therapy Ball Flexion: Other (comment) (dc) ABduction: 25 reps Right/Left:  (begin when AROM exercises are initiated) ROM / Strengthening / Isometric Strengthening Wall Wash: low 2' Prot/Ret//Elev/Dep: 1'      Manual Therapy Manual Therapy: Myofascial release Myofascial Release: MFR and manual stretching to right upper arm, trapezius, scapular, and shoulder region to decrease restrictions and increase pain free mobility, majority of restrictions noted in anterior shoulder.  [phone]  Occupational  Therapy Assessment and Plan OT Assessment and Plan Clinical Impression Statement: A:  Patient demonstrated full PROM and AAROM with flexion, internal rotation.  Abducted is approximately 80% with increased pain at end range, ER is approximately 60%.  DC ball and pulley exercises for flexion as PROM is WFL. Required min tactile facilitation to move through end ranges of abduction and for proper positioning for external rotation. OT Plan: P:  Begin AROM in supine for flexion, protraction, horizontal abduction, internal rotation.     Goals Short Term Goals Time to Complete Short Term Goals: 3 weeks Short Term Goal 1: Patient will be educated on HEP.  Short Term Goal 1 Progress: Progressing toward goal Short Term Goal 2: Patient will improve R AROM to [facility] for increased ability to put dished in upper kitchen cabinets. Short Term Goal 2 Progress: Progressing toward goal Short Term Goal 3: Right shoulder strength will be assessed. Short Term Goal 3 Progress: Progressing toward goal Short Term Goal 4: Patient will decrease fascial restrictions in R shoulder/upper arm regions from moderate to minimal. Short Term Goal 4 Progress: Progressing toward goal Short Term Goal 5: Patient will decrease pain to 2/10 when performing BADL tasks. Short Term Goal 5 Progress: Progressing toward goal Long Term Goals Time to Complete Long Term Goals: 6 weeks Long Term Goal 1: Patient will return to PLOF with all B/IADL, leisure and work tasks. Long Term Goal 1 Progress: Progressing toward goal Long Term Goal 2: Patient will have normal (5/5) strength in R shoulder.  Long Term Goal 2 Progress: Progressing toward goal Long Term Goal 3: Patient will have trace fascial restrictions in R shoulder/upper arm regions. 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 Long Term Goal 4 Progress:  Progressing toward goal  Problem List Patient Active Problem List  Diagnosis  . Pain in joint, shoulder region  . Muscle weakness (generalized)    End of Session Activity Tolerance: Patient tolerated treatment well General Behavior During Session: [facility] for tasks performed Cognition: [facility] for tasks performed   Shirlean Mylar, OTR/L  07/24/2012, 9:37 AM

## 2012-07-29 ENCOUNTER — Ambulatory Visit (HOSPITAL_COMMUNITY)
Admission: RE | Admit: 2012-07-29 | Discharge: 2012-07-29 | Disposition: A | Discharge status: Home / Self Care | Payer: Medicare Other | Source: Ambulatory Visit | Attending: Family Medicine | Admitting: Family Medicine

## 2012-07-29 NOTE — Progress Notes (Signed)
Occupational Therapy Treatment Patient Details  Name: AJAI HARVILLE MRN: [id number] Date of Birth: 16-Feb-1946  Today's Date: 07/29/2012 Time: 7829-5621 OT Time Calculation (min): 38 min Manual Therapy [phone] 14' Therapeutic Exercises [phone] 22' Visit#: 5  of 12   Re-eval: 08/06/12    Authorization: no auth required  Subjective S;  THe hardest thing for me to do is to reach up overhead. Pain Assessment Currently in Pain?: Yes Pain Score:   3 Pain Location: Shoulder Pain Orientation: Right Pain Type: Acute pain  Precautions/Restrictions   Progress as tolerated  Exercise/Treatments Supine Protraction: PROM;AROM;10 reps Horizontal ABduction: PROM;AROM;10 reps External Rotation: PROM;AROM;10 reps External Rotation Limitations: minimal tactile cues to maintain proper positioning throughout range. Internal Rotation: PROM;AROM;10 reps Flexion: PROM;AROM;10 reps ABduction: PROM;AROM;10 reps Seated Extension: Theraband;10 reps (red) Retraction: Theraband;10 reps (red) Row: Theraband;10 reps (red) Protraction: AROM;10 reps Horizontal ABduction: AROM (7 reps ) External Rotation: AROM;10 reps Internal Rotation: AROM;10 reps Flexion: AROM;10 reps Abduction: AROM;10 reps Pulleys Flexion:  (dc) ABduction:  (dc) Therapy Ball Flexion: Other (comment) (dc) ABduction: Other (comment) (dc) Right/Left: 5 reps ROM / Strengthening / Isometric Strengthening UBE (Upper Arm Bike): add next visit Wall Wash: 3' Proximal Shoulder Strengthening, Seated: begin next visit Prot/Ret//Elev/Dep: hold today due to arm fatiguing      Manual Therapy Myofascial Release: MFR and manual stretching to right upper arm, trapezius, scapular, and shoulder region to decrease restrictions and increase pain free mobility.[phone]  Occupational Therapy Assessment and Plan OT Assessment and Plan Clinical Impression Statement: A:  Patient demonstrated full AAROM in supine and seated, therefore progressed  to AROM in both positions.  DC pulleys and ball as patient has sufficient ROM.   OT Plan: P:  Add UBE.  Begin strengthening as pain level decreases with AROM.   Goals Short Term Goals Time to Complete Short Term Goals: 3 weeks Short Term Goal 1: Patient will be educated on HEP.  Short Term Goal 1 Progress: Progressing toward goal Short Term Goal 2: Patient will improve R AROM to [facility] for increased ability to put dished in upper kitchen cabinets. Short Term Goal 2 Progress: Progressing toward goal Short Term Goal 3: Right shoulder strength will be assessed. Short Term Goal 3 Progress: Progressing toward goal Short Term Goal 4: Patient will decrease fascial restrictions in R shoulder/upper arm regions from moderate to minimal. Short Term Goal 4 Progress: Progressing toward goal Short Term Goal 5: Patient will decrease pain to 2/10 when performing BADL tasks. Short Term Goal 5 Progress: Progressing toward goal Long Term Goals Time to Complete Long Term Goals: 6 weeks Long Term Goal 1: Patient will return to PLOF with all B/IADL, leisure and work tasks. Long Term Goal 1 Progress: Progressing toward goal Long Term Goal 2: Patient will have normal (5/5) strength in R shoulder.  Long Term Goal 2 Progress: Progressing toward goal Long Term Goal 3: Patient will have trace fascial restrictions in R shoulder/upper arm regions. 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 Long Term Goal 4 Progress: Progressing toward goal  Problem List Patient Active Problem List  Diagnosis  . Pain in joint, shoulder region  . Muscle weakness (generalized)    End of Session Activity Tolerance: Patient tolerated treatment well General Behavior During Session: [facility] for tasks performed Cognition: [facility] for tasks performed  GO    Shirlean Mylar, OTR/L  07/29/2012,  10:18 AM

## 2012-07-31 ENCOUNTER — Ambulatory Visit (HOSPITAL_COMMUNITY)
Admission: RE | Admit: 2012-07-31 | Discharge: 2012-07-31 | Disposition: A | Discharge status: Home / Self Care | Payer: Medicare Other | Source: Ambulatory Visit | Attending: Family Medicine | Admitting: Family Medicine

## 2012-07-31 DIAGNOSIS — M6281 Muscle weakness (generalized): Secondary | ICD-10-CM

## 2012-07-31 DIAGNOSIS — M25519 Pain in unspecified shoulder: Secondary | ICD-10-CM

## 2012-07-31 NOTE — Progress Notes (Signed)
Occupational Therapy Treatment Patient Details  Name: Christopher [name] MRN: [id number] Date of Birth: August 14, 1946  Today's Date: 07/31/2012 Time: 8295-6213 OT Time Calculation (min): 39 min Manual Therapy [phone] 14' Therapeutic Exercises [phone] 25' Visit#: 6  of 12   Re-eval: 08/06/12    Subjective S:  Do you think it is too soon for me to lift a water heater?  I did it yesterday, and I didn't even think about this arm. Limitations: Discussed biomechanics of shoulder, and specifics of his surgery.  I cautioned him not to over do activities with his shoulder, however, he does not have any specific precatuions that must be followed. Pain Assessment Currently in Pain?: Yes Pain Score:   2 Pain Location: Shoulder Pain Orientation: Right Pain Type: Acute pain  Precautions/Restrictions   Progress as tolerated    Exercise/Treatments Supine Protraction: PROM;5 reps;Strengthening;10 reps Protraction Weight (lbs): 1 Horizontal ABduction: PROM;5 reps;Strengthening;10 reps Horizontal ABduction Weight (lbs): 1 External Rotation: PROM;5 reps;Strengthening;10 reps External Rotation Weight (lbs): 1 Internal Rotation: PROM;5 reps;Strengthening;10 reps Internal Rotation Weight (lbs): 1 Flexion: PROM;5 reps;Strengthening;10 reps Shoulder Flexion Weight (lbs): 1 ABduction: PROM;5 reps;Strengthening;10 reps Shoulder ABduction Weight (lbs): 1 Seated Extension: Theraband;15 reps (green) Retraction: Theraband;15 reps (green) Row: Theraband;15 reps (green) Protraction: Strengthening;10 reps Protraction Weight (lbs): 2 Horizontal ABduction: Strengthening;10 reps Horizontal ABduction Weight (lbs): 2 External Rotation: Strengthening;10 reps External Rotation Weight (lbs): 2 Internal Rotation: Strengthening;10 reps Internal Rotation Weight (lbs): 2 Flexion: Strengthening;10 reps Flexion Weight (lbs): 2 Abduction: AROM;10 reps ABduction Limitations: unable to complete strengthening in  abduction due to increased pain at surgical site.  Required unweighting of arm, then able to complete AROM with minimal pain. Therapy Ball ABduction: 10 reps ABduction Limitations: completed to increase I with abduction in pain free range due to increased pain with abd AROM in seated today Right/Left: 5 reps ROM / Strengthening / Isometric Strengthening UBE (Upper Arm Bike): begin next visit Wall Wash: 3' "W" Arms: 10 reps with 1# X to V Arms: 10 reps with 1# Proximal Shoulder Strengthening, Seated: 10 reps with 1# Prot/Ret//Elev/Dep: hold today per patient request not to over do it      Manual Therapy Manual Therapy: Myofascial release Myofascial Release: MFR and manual stretching to right upper arm, trapezius, scapular, and shoulder region to decrease restrictions and increase pain free mobility. Trace fascial restrictions noted this date.  [phone]  Occupational Therapy Assessment and Plan OT Assessment and Plan Clinical Impression Statement: A: Patient demonstrated full, pain free AROM and PROM in supine this date, therefore inititated strengthening in supine, as well as seated.  He was not able to complete strengthening in seated for abduction due to increased pain at incision site.   OT Plan: P:  Add weight to abduction in seated if pain level is decreased.  Add UBE.   Goals Short Term Goals Time to Complete Short Term Goals: 3 weeks Short Term Goal 1: Patient will be educated on HEP.  Short Term Goal 1 Progress: Met Short Term Goal 2: Patient will improve R AROM to [facility] for increased ability to put dished in upper kitchen cabinets. Short Term Goal 3: Right shoulder strength will be assessed. Short Term Goal 4: Patient will decrease fascial restrictions in R shoulder/upper arm regions from moderate to minimal. Short Term Goal 4 Progress: Met Short Term Goal 5: Patient will decrease pain to 2/10 when performing BADL tasks. Short Term Goal 5 Progress: Met Long Term Goals Time to  Complete Long Term Goals: 6 weeks  Long Term Goal 1: Patient will return to PLOF with all B/IADL, leisure and work tasks. Long Term Goal 2: Patient will have normal (5/5) strength in R shoulder.  Long Term Goal 3: Patient will have trace fascial restrictions in R shoulder/upper arm regions. 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  Problem List Patient Active Problem List  Diagnosis  . Pain in joint, shoulder region  . Muscle weakness (generalized)    End of Session Activity Tolerance: Patient tolerated treatment well General Behavior During Session: [facility] for tasks performed Cognition: [facility] for tasks performed  GO    Shirlean Mylar, OTR/L  07/31/2012, 9:29 AM

## 2012-08-05 ENCOUNTER — Ambulatory Visit (HOSPITAL_COMMUNITY)
Admission: RE | Admit: 2012-08-05 | Discharge: 2012-08-05 | Disposition: A | Discharge status: Home / Self Care | Payer: Medicare Other | Source: Ambulatory Visit | Attending: Family Medicine | Admitting: Family Medicine

## 2012-08-05 NOTE — Progress Notes (Signed)
Occupational Therapy Treatment Patient Details  Name: Christopher [name] MRN: [id number] Date of Birth: 05/31/1946  Today's Date: 08/05/2012 Time: 0850-0950 OT Time Calculation (min): 60 min Manual Therapy [phone] 17' Reassess [phone] Therapeutic Exercises [phone] 25' Visit#: 7  of 12   Re-eval: 09/02/12    Authorization: no auth required    Subjective S:  Its been hurting more than normal.  I don't know if was lifting the water heater or what, but I have had more pain the past 4 days.   Limitations: I cautioned him not to over do activities with his shoulder, however, he does not have any specific precatuions that must be followed. Special Tests: UEFI:  60/80=75% Pain Assessment Currently in Pain?: Yes Pain Score:   4 Pain Location: Shoulder Pain Orientation: Right Pain Type: Acute pain  Precautions/Restrictions    Progress as tolerated Exercise/Treatments Supine Protraction: PROM;5 reps Horizontal ABduction: PROM;5 reps External Rotation: PROM;5 reps Internal Rotation: PROM;5 reps Flexion: PROM;5 reps ABduction: PROM;5 reps Seated Extension: Theraband;15 reps Theraband Level (Shoulder Extension): Level 3 (Green) Retraction: Theraband;15 reps Theraband Level (Shoulder Retraction): Level 3 (Green) Row: Theraband;15 reps Theraband Level (Shoulder Row): Level 3 (Green) External Rotation: Theraband;15 reps;AAROM;20 reps Theraband Level (Shoulder External Rotation): Level 3 (Green) Internal Rotation: Theraband;15 reps Theraband Level (Shoulder Internal Rotation): Level 3 (Green) Therapy Ball ABduction:  (hold today) Right/Left: 5 reps ROM / Strengthening / Isometric Strengthening UBE (Upper Arm Bike): 3' and 3' 1.0 Wall Wash: hold today "W" Arms: hold today X to V Arms: hold today Proximal Shoulder Strengthening, Seated: hold today Prot/Ret//Elev/Dep: hold today      Manual Therapy Manual Therapy: Myofascial release Myofascial Release: MFR and manual stretching to  right upper arm, trapezius, scapular, and shoulder region to decrease restrictions and increase pain free mobility.[phone] Ultrasound Ultrasound Location: Anterior right shoulder Ultrasound Parameters: 3 mhz continuous at 1.5 intensity Ultrasound Goals: Pain  Occupational Therapy Assessment and Plan OT Assessment and Plan Clinical Impression Statement: A: Please refer to MD progress note. Added an additional long term goal to address external rotation AROM. OT Frequency: Min 2X/week OT Duration: 4 weeks OT Plan: P:  Resume ther ex, as tolerated, focus in on increasing external rotation with shoulder adducted for functional tasks.  Follow up with pain relief from [place].   Goals Short Term Goals Time to Complete Short Term Goals: 3 weeks Short Term Goal 1: Patient will be educated on HEP.  Short Term Goal 1 Progress: Met Short Term Goal 2: Patient will improve R AROM to [facility] for increased ability to put dished in upper kitchen cabinets. Short Term Goal 2 Progress: Met Short Term Goal 3: Right shoulder strength will be assessed. Short Term Goal 3 Progress: Met Short Term Goal 4: Patient will decrease fascial restrictions in R shoulder/upper arm regions from moderate to minimal. Short Term Goal 4 Progress: Met Short Term Goal 5: Patient will decrease pain to 2/10 when performing BADL tasks. Short Term Goal 5 Progress: Progressing toward goal Long Term Goals Time to Complete Long Term Goals: 6 weeks Long Term Goal 1: Patient will return to PLOF with all B/IADL, leisure and work tasks. Long Term Goal 1 Progress: Progressing toward goal Long Term Goal 2: Patient will have normal (5/5) strength in R shoulder.  Long Term Goal 2 Progress: Met Long Term Goal 3: Patient will have trace fascial restrictions in R shoulder/upper arm regions. 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 Long Term Goal 4 Progress: Progressing toward goal Long Term Goal 5: New goal 08/05/12:  Increase external rotation to WNL with arm adducted for increased ability to reach for seatbelt.    Problem List Patient Active Problem List  Diagnosis  . Pain in joint, shoulder region  . Muscle weakness (generalized)    General Behavior During Session: [facility] for tasks performed Cognition: [facility] for tasks performed  GO    Shirlean Mylar, OTR/L  08/05/2012, 9:48 AM

## 2012-08-07 ENCOUNTER — Ambulatory Visit (HOSPITAL_COMMUNITY)
Admission: RE | Admit: 2012-08-07 | Discharge: 2012-08-07 | Disposition: A | Discharge status: Home / Self Care | Payer: Medicare Other | Source: Ambulatory Visit | Attending: Family Medicine | Admitting: Family Medicine

## 2012-08-07 NOTE — Progress Notes (Signed)
Occupational Therapy Treatment Patient Details  Name: Christopher [name] MRN: [id number] Date of Birth: 11-21-1946  Today's Date: 08/07/2012 Time: 4098-1191 OT Time Calculation (min): 47 min Manual Therapy [phone] 19' Therapeutic Exercises [phone] 28' Visit#: 8  of 12   Re-eval: 09/02/12    Authorization: no auth required    Subjective S:  Its felt better since Tuesday.   Pain Assessment Currently in Pain?: Yes Pain Score:   2 Pain Location: Shoulder Pain Orientation: Right Pain Type: Acute pain  Precautions/Restrictions   Progress as tolerated  Exercise/Treatments Supine Protraction: PROM;5 reps;Strengthening;10 reps Protraction Weight (lbs): 1 Horizontal ABduction: PROM;5 reps;Strengthening;10 reps Horizontal ABduction Weight (lbs): 1 External Rotation: PROM;5 reps;Strengthening;10 reps External Rotation Weight (lbs): 1 Internal Rotation: PROM;5 reps;Strengthening;10 reps Internal Rotation Weight (lbs): 1 Flexion: PROM;5 reps;Strengthening;10 reps Shoulder Flexion Weight (lbs): 1 ABduction: PROM;5 reps;Strengthening;10 reps Shoulder ABduction Weight (lbs): 1 ABduction Limitations: required unweighting through mid range Seated Protraction: Strengthening;10 reps Protraction Weight (lbs): 1 Horizontal ABduction: Strengthening;10 reps Horizontal ABduction Weight (lbs): 1 External Rotation: Strengthening;10 reps;Theraband;15 reps Theraband Level (Shoulder External Rotation): Level 3 (Green) External Rotation Weight (lbs): 1 Internal Rotation: Strengthening;10 reps;Theraband;15 reps Theraband Level (Shoulder Internal Rotation): Level 3 (Green) Internal Rotation Weight (lbs): 1 Flexion: Strengthening;10 reps;Theraband;15 reps Theraband Level (Shoulder Flexion): Level 3 (Green) Flexion Weight (lbs): 1 Abduction: Strengthening;10 reps ABduction Limitations: un weighted through mid range Therapy Ball Right/Left: 5 reps ROM / Strengthening / Isometric Strengthening UBE  (Upper Arm Bike): hold today, unavailable Cybex Press: 1.5 plate;10 reps Cybex Row: 1.5 plate;10 reps Wall Wash: 3' "W" Arms: 10 reps with 1# X to V Arms: 10 reps with 1# Proximal Shoulder Strengthening, Seated: dc Prot/Ret//Elev/Dep: dc         Manual Therapy Manual Therapy: Myofascial release Myofascial Release: MFR and manual stretching to right upper arm, trapezius, scapular, and shoulder region to decrease restrictions and increase pain free mobility [phone]  Occupational Therapy Assessment and Plan OT Assessment and Plan Clinical Impression Statement: : Patient with less pain this date, therefore, resumed strengthening but with less weight. Did not complete US, as pain level was low. OT Plan: P:  Complete abduction exercises without assist from therapist.  Increase reps with cybex press and row, resume UBE.     Goals Short Term Goals Time to Complete Short Term Goals: 3 weeks Short Term Goal 1: Patient will be educated on HEP.  Short Term Goal 2: Patient will improve R AROM to [facility] for increased ability to put dished in upper kitchen cabinets. Short Term Goal 3: Right shoulder strength will be assessed. Short Term Goal 4: Patient will decrease fascial restrictions in R shoulder/upper arm regions from moderate to minimal. Short Term Goal 5: Patient will decrease pain to 2/10 when performing BADL tasks. Long Term Goals Time to Complete Long Term Goals: 6 weeks Long Term Goal 1: Patient will return to PLOF with all B/IADL, leisure and work tasks. Long Term Goal 2: Patient will have normal (5/5) strength in R shoulder.  Long Term Goal 3: Patient will have trace fascial restrictions in R shoulder/upper arm regions. 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 Long Term Goal 5: New goal 08/05/12:  Increase external rotation to WNL with arm adducted for increased ability to reach for seatbelt.    Problem  List Patient Active Problem List  Diagnosis  . Pain in joint, shoulder region  . Muscle weakness (generalized)  End of Session Activity Tolerance: Patient tolerated treatment well General Behavior During Session: [facility] for tasks performed Cognition: [facility] for tasks performed  GO    Shirlean Mylar, OTR/L  08/07/2012, 9:13 AM

## 2012-08-12 ENCOUNTER — Ambulatory Visit (HOSPITAL_COMMUNITY)
Admission: RE | Admit: 2012-08-12 | Discharge: 2012-08-12 | Disposition: A | Discharge status: Home / Self Care | Payer: Medicare Other | Source: Ambulatory Visit | Attending: Family Medicine | Admitting: Family Medicine

## 2012-08-12 DIAGNOSIS — M6281 Muscle weakness (generalized): Secondary | ICD-10-CM

## 2012-08-12 DIAGNOSIS — M25519 Pain in unspecified shoulder: Secondary | ICD-10-CM

## 2012-08-12 NOTE — Progress Notes (Signed)
Occupational Therapy Treatment Patient Details  Name: Christopher [name] MRN: [id number] Date of Birth: 11-Mar-1946  Today's Date: 08/12/2012 Time: 0802-0852 OT Time Calculation (min): 50 min Manual Therapy [phone] 16' Therapeutic Exercises [phone] 44' Visit#: 9  of 12   Re-eval: 09/02/12    Authorization: no auth required   Authorization Time Period:    Authorization Visit#:   of    Subjective S:  Its doing better.  I did some overhead work, and that didnt feel great. Pain Assessment Currently in Pain?: Yes Pain Score:   1 Pain Location: Shoulder Pain Orientation: Right Pain Type: Acute pain  Precautions/Restrictions   N/A  Exercise/Treatments Supine Protraction: PROM;5 reps;Strengthening;12 reps Protraction Weight (lbs): 1 Horizontal ABduction: PROM;5 reps;Strengthening;12 reps Horizontal ABduction Weight (lbs): 1 External Rotation: PROM;5 reps;Strengthening;12 reps External Rotation Weight (lbs): 1 Internal Rotation: PROM;5 reps;Strengthening;12 reps Internal Rotation Weight (lbs): 1 Flexion: PROM;5 reps;Strengthening;12 reps Shoulder Flexion Weight (lbs): 1 ABduction: PROM;5 reps;Strengthening;12 reps Shoulder ABduction Weight (lbs): 1 Seated Protraction: Strengthening;12 reps Protraction Weight (lbs): 1 Horizontal ABduction: Strengthening;12 reps Horizontal ABduction Weight (lbs): 1 External Rotation: Strengthening;12 reps External Rotation Weight (lbs): 1 Internal Rotation: Strengthening;12 reps Internal Rotation Weight (lbs): 1 Flexion: Strengthening;12 reps Flexion Weight (lbs): 1 Abduction: Strengthening;12 reps ABduction Weight (lbs): 1 Standing External Rotation: Theraband;15 reps Theraband Level (Shoulder External Rotation): Level 3 (Green) Internal Rotation: Theraband;15 reps Theraband Level (Shoulder Internal Rotation): Level 3 (Green) Extension: Theraband;15 reps Theraband Level (Shoulder Extension): Level 3 (Green) Row: Theraband;15  reps Theraband Level (Shoulder Row): Level 3 (Green) Retraction: Theraband;15 reps Theraband Level (Shoulder Retraction): Level 3 (Green) Therapy Ball Right/Left: Other (comment) (dc) ROM / Strengthening / Isometric Strengthening UBE (Upper Arm Bike): 3' and 3' 1.0 Cybex Press: 1.5 plate;20 reps Cybex Row: 1.5 plate;20 reps Wall Wash: 3' with 1# "W" Arms: 12 reps with 1# X to V Arms: 12 reps with 1# Proximal Shoulder Strengthening, Seated: 10 reps wit h1# without resting      Manual Therapy Manual Therapy: Myofascial release Myofascial Release: MFR and manual stretching to right upper arm, trapezius, scapular, and shoulder region to decrease restrictions and increase pain free mobility [phone] Ultrasound Ultrasound Location: not needed this date  Occupational Therapy Assessment and Plan OT Assessment and Plan Clinical Impression Statement: A:  Significant improvement in independence and pain level during strengthening exercises.  Able to complete all [place] in seated and supine.  Did not complete US this date.  Added weight to wall wash for increased sustained activity tolerance in right shoulder with functional activities. OT Plan: P:  Increase weight on UBE and increase to 2# with strengthening exercises.     Goals Short Term Goals Time to Complete Short Term Goals: 3 weeks Short Term Goal 1: Patient will be educated on HEP.  Short Term Goal 2: Patient will improve R AROM to [facility] for increased ability to put dished in upper kitchen cabinets. Short Term Goal 3: Right shoulder strength will be assessed. Short Term Goal 4: Patient will decrease fascial restrictions in R shoulder/upper arm regions from moderate to minimal. Short Term Goal 5: Patient will decrease pain to 2/10 when performing BADL tasks. Long Term Goals Time to Complete Long Term Goals: 6 weeks Long Term Goal 1: Patient will return to PLOF with all B/IADL, leisure and work tasks. Long Term Goal 1 Progress: Progressing  toward goal Long Term Goal 2: Patient will have normal (5/5) strength in R shoulder.  Long Term Goal 2 Progress: Met Long Term Goal 3:  Patient will have trace fascial restrictions in R shoulder/upper arm regions. 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 Long Term Goal 5: New goal 08/05/12:  Increase external rotation to WNL with arm adducted for increased ability to reach for seatbelt.   Long Term Goal 5 Progress: Progressing toward goal  Problem List Patient Active Problem List  Diagnosis  . Pain in joint, shoulder region  . Muscle weakness (generalized)    End of Session Activity Tolerance: Patient tolerated treatment well General Behavior During Session: [facility] for tasks performed Cognition: [facility] for tasks performed  GO    Shirlean Mylar, OTR/L  08/12/2012, 9:17 AM

## 2012-08-14 ENCOUNTER — Ambulatory Visit (HOSPITAL_COMMUNITY)
Admission: RE | Admit: 2012-08-14 | Discharge: 2012-08-14 | Disposition: A | Discharge status: Home / Self Care | Payer: Medicare Other | Source: Ambulatory Visit | Attending: Family Medicine | Admitting: Family Medicine

## 2012-08-14 DIAGNOSIS — M6281 Muscle weakness (generalized): Secondary | ICD-10-CM

## 2012-08-14 DIAGNOSIS — M25519 Pain in unspecified shoulder: Secondary | ICD-10-CM

## 2012-08-14 NOTE — Progress Notes (Signed)
Occupational Therapy Treatment Patient Details  Name: Christopher [name] MRN: [id number] Date of Birth: February 26, 1946  Today's Date: 08/14/2012 Time: 0805-0827 OT Time Calculation (min): 22 min Manual Therapy [phone] 10' Reassess 815-827 8' Visit#: 10  of 12   Re-eval: 08/14/12    Authorization: [employer]  Authorization Time Period:    Authorization Visit#: 10  of 10   Subjective Symptoms: S:  I think I am ready to graduate. Special Tests: UEFI was 75% on 08/05/12, currently 99% Pain Assessment Currently in Pain?: No/denies  Precautions/Restrictions   N/A  Exercise/Treatments    Manual Therapy Manual Therapy: Myofascial release Myofascial Release: MFR and manual stretching to right upper arm, trapezius, scapular, and shoulder region to decrease restrictions and increase pain free mobility [phone]  Occupational Therapy Assessment and Plan OT Assessment and Plan Clinical Impression Statement: A:  Please refer to dc summary.  Patient has met all OT goals, is pleased with his progress, and has requested dc this date. OT Plan: P:  DC from OT services secondary to all goals met.    Goals Short Term Goals Time to Complete Short Term Goals: 3 weeks Short Term Goal 1: Patient will be educated on HEP.  Short Term Goal 1 Progress: Met Short Term Goal 2: Patient will improve R AROM to [place] for increased ability to put dished in upper kitchen cabinets. Short Term Goal 2 Progress: Met Short Term Goal 3: Right shoulder strength will be assessed. Short Term Goal 3 Progress: Met Short Term Goal 4: Patient will decrease fascial restrictions in R shoulder/upper arm regions from moderate to minimal. Short Term Goal 4 Progress: Met Short Term Goal 5: Patient will decrease pain to 2/10 when performing BADL tasks. Short Term Goal 5 Progress: Met Long Term Goals Time to Complete Long Term Goals: 6 weeks Long Term Goal 1: Patient will return to PLOF with all B/IADL, leisure and work  tasks. Long Term Goal 1 Progress: Met Long Term Goal 2: Patient will have normal (5/5) strength in R shoulder.  Long Term Goal 2 Progress: Met Long Term Goal 3: Patient will have trace fascial restrictions in R shoulder/upper arm regions. 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 Long Term Goal 4 Progress: Met Long Term Goal 5: New goal 08/05/12:  Increase external rotation to WNL with arm adducted for increased ability to reach for seatbelt.   Long Term Goal 5 Progress: Met  Problem List Patient Active Problem List  Diagnosis  . Pain in joint, shoulder region  . Muscle weakness (generalized)    End of Session Activity Tolerance: Patient tolerated treatment well General Behavior During Session: [facility] for tasks performed OT Plan of Care OT Home Exercise Plan: Strengthening exercises in standing.  GO Functional Assessment Tool Used: UEFI Functional Limitation: Carrying, moving and handling objects Carrying, Moving and Handling Objects Current Status (W0981): 0 percent impaired, limited or restricted Carrying, Moving and Handling Objects Goal Status (X9147): 0 percent impaired, limited or restricted Carrying, Moving and Handling Objects Discharge Status [id number]): 0 percent impaired, limited or restricted  Shirlean Mylar, OTR/L  08/14/2012, 8:35 AM

## 2012-10-31 ENCOUNTER — Other Ambulatory Visit: Payer: Self-pay | Admitting: Neurology

## 2012-10-31 ENCOUNTER — Ambulatory Visit (HOSPITAL_COMMUNITY)
Admission: RE | Admit: 2012-10-31 | Discharge: 2012-10-31 | Disposition: A | Discharge status: Home / Self Care | Payer: Medicare Other | Source: Ambulatory Visit | Attending: Neurology | Admitting: Neurology

## 2012-10-31 DIAGNOSIS — M47817 Spondylosis without myelopathy or radiculopathy, lumbosacral region: Secondary | ICD-10-CM | POA: Insufficient documentation

## 2012-10-31 DIAGNOSIS — R2681 Unsteadiness on feet: Secondary | ICD-10-CM

## 2012-10-31 DIAGNOSIS — M545 Low back pain, unspecified: Secondary | ICD-10-CM

## 2012-10-31 DIAGNOSIS — M5416 Radiculopathy, lumbar region: Secondary | ICD-10-CM

## 2012-12-15 ENCOUNTER — Other Ambulatory Visit (HOSPITAL_COMMUNITY): Payer: Self-pay | Admitting: Family Medicine

## 2012-12-15 DIAGNOSIS — R1013 Epigastric pain: Secondary | ICD-10-CM

## 2012-12-16 ENCOUNTER — Ambulatory Visit (HOSPITAL_COMMUNITY)
Admission: RE | Admit: 2012-12-16 | Discharge: 2012-12-16 | Disposition: A | Discharge status: Home / Self Care | Payer: Medicare Other | Source: Ambulatory Visit | Attending: Family Medicine | Admitting: Family Medicine

## 2012-12-16 DIAGNOSIS — K449 Diaphragmatic hernia without obstruction or gangrene: Secondary | ICD-10-CM | POA: Insufficient documentation

## 2012-12-16 DIAGNOSIS — K219 Gastro-esophageal reflux disease without esophagitis: Secondary | ICD-10-CM | POA: Insufficient documentation

## 2012-12-16 DIAGNOSIS — R1013 Epigastric pain: Secondary | ICD-10-CM | POA: Insufficient documentation

## 2012-12-22 ENCOUNTER — Other Ambulatory Visit (HOSPITAL_COMMUNITY): Payer: Self-pay | Admitting: Family Medicine

## 2012-12-22 DIAGNOSIS — K469 Unspecified abdominal hernia without obstruction or gangrene: Secondary | ICD-10-CM

## 2012-12-22 DIAGNOSIS — R1013 Epigastric pain: Secondary | ICD-10-CM

## 2012-12-23 ENCOUNTER — Ambulatory Visit (HOSPITAL_COMMUNITY)
Admission: RE | Admit: 2012-12-23 | Discharge: 2012-12-23 | Disposition: A | Discharge status: Home / Self Care | Payer: Medicare Other | Source: Ambulatory Visit | Attending: Family Medicine | Admitting: Family Medicine

## 2012-12-23 DIAGNOSIS — R109 Unspecified abdominal pain: Secondary | ICD-10-CM | POA: Insufficient documentation

## 2012-12-23 DIAGNOSIS — K469 Unspecified abdominal hernia without obstruction or gangrene: Secondary | ICD-10-CM

## 2012-12-23 DIAGNOSIS — R1013 Epigastric pain: Secondary | ICD-10-CM

## 2012-12-24 NOTE — Progress Notes (Deleted)
Pt provided with discharge instructions and prescriptions.  Teach back method used.  Pt verbalized understanding of d/c instructions and smoking cessation information.  Pt's IV removed.  Site WNL.  Pt stable at time of d/c.  Pt refused wheelchair.  Ambulated to main entrance for d/c.

## 2013-01-06 ENCOUNTER — Telehealth (INDEPENDENT_AMBULATORY_CARE_PROVIDER_SITE_OTHER): Payer: Self-pay | Admitting: *Deleted

## 2013-01-06 ENCOUNTER — Encounter (INDEPENDENT_AMBULATORY_CARE_PROVIDER_SITE_OTHER): Payer: Self-pay | Admitting: Internal Medicine

## 2013-01-06 ENCOUNTER — Other Ambulatory Visit (INDEPENDENT_AMBULATORY_CARE_PROVIDER_SITE_OTHER): Payer: Self-pay | Admitting: *Deleted

## 2013-01-06 ENCOUNTER — Ambulatory Visit (INDEPENDENT_AMBULATORY_CARE_PROVIDER_SITE_OTHER): Payer: Medicare Other | Admitting: Internal Medicine

## 2013-01-06 VITALS — BP 110/62 | HR 60 | Temp 97.8°F | Ht 68.0 in | Wt 148.7 lb

## 2013-01-06 DIAGNOSIS — R1013 Epigastric pain: Secondary | ICD-10-CM

## 2013-01-06 DIAGNOSIS — Z1211 Encounter for screening for malignant neoplasm of colon: Secondary | ICD-10-CM

## 2013-01-06 DIAGNOSIS — R634 Abnormal weight loss: Secondary | ICD-10-CM

## 2013-01-06 MED ORDER — PEG-KCL-NACL-NASULF-NA ASC-C 100 G PO SOLR: 1.0000 | Freq: Once | ORAL | Status: DC

## 2013-01-06 NOTE — Patient Instructions (Addendum)
EGD/Colonoscopy

## 2013-01-06 NOTE — Telephone Encounter (Signed)
Patient needs movi prep 

## 2013-01-06 NOTE — Progress Notes (Signed)
Subjective:     Patient ID: Christopher [name], male   DOB: February 26, 1946, 67 y.o.   MRN: [id number]  HPI  Referred to our office by Dr. Renard Matter for a screening colonoscopy. He has never undergone a screening colonoscopy. He has a BM every other day. He had some rectal bleeding before starting the stool softners but none now. No melena.  He c/o today of bloating and epigastric pain. Symptoms x 1 month.  He says he hurts all the time.  Started on Carafate but did not help. Nexium does not help with the pain. He has been on Nexium for about 10 yrs. No hematemesis.  Pain worse after eating. Spicy foods and pepper makes the pain worse.  No dysphagia.  Weight loss 10 pounds over the past 2 months. Upper GI series with KUB 12/16/2012 for abdominal pain, epigastric pain 12/16/2012 Small hiatal hernia. Small; amt of GE reflux.    12/22/2012 H and H 13.9 and 40.4, MCV 88,.2, Amylase 39, Lipase 20 12/23/2012 US abdomen: Normal exam. No intrahepatic, common hepatic, or CBD dilatation.  No gallstones, no gallbladder wall thickening, or pericholecystic fluid collection.   Review of Systems see hpi Current Outpatient Prescriptions  Medication Sig Dispense Refill  . ALPRAZolam (XANAX) 1 MG tablet Take 1 mg by mouth at bedtime as needed.      Jennette Banker Sodium 30-100 MG CAPS Take by mouth.      . esomeprazole (NEXIUM) 40 MG capsule Take 40 mg by mouth 2 (two) times daily.      . sucralfate (CARAFATE) 1 GM/10ML suspension Take 1 g by mouth 4 (four) times daily.       History reviewed. No pertinent past medical history. Past Surgical History  Procedure Date  . Neck surgery     x 3  . Rt shoulder   . Appendectomy    Allergies  Allergen Reactions  . Morphine And Related         Objective:   Physical Exam Filed Vitals:   01/06/13 0935  BP: 110/62  Pulse: 60  Temp: 97.8 F (36.6 C)  Height: 5\' 8"  (1.727 m)  Weight: 148 lb 11.2 oz (67.45 kg)        Assessment:    Epigastric pain:  PUD needs to be ruled out. Screening colonoscopy.    Plan:    EGD/Colonoscopy.   The risks and benefits such as perforation, bleeding, and infection were reviewed with the patient and is agreeable.

## 2013-01-08 ENCOUNTER — Encounter (INDEPENDENT_AMBULATORY_CARE_PROVIDER_SITE_OTHER): Payer: Self-pay

## 2013-01-09 ENCOUNTER — Encounter (HOSPITAL_COMMUNITY): Payer: Self-pay | Admitting: Pharmacy Technician

## 2013-01-21 MED ORDER — SODIUM CHLORIDE 0.45 % IV SOLN
INTRAVENOUS | Status: DC
  Administered 2013-01-22: 13:00:00 via INTRAVENOUS

## 2013-01-22 ENCOUNTER — Encounter (INDEPENDENT_AMBULATORY_CARE_PROVIDER_SITE_OTHER): Payer: Self-pay | Admitting: Internal Medicine

## 2013-01-22 ENCOUNTER — Ambulatory Visit (HOSPITAL_COMMUNITY)
Admission: RE | Admit: 2013-01-22 | Discharge: 2013-01-22 | Disposition: A | Discharge status: Home / Self Care | Payer: Medicare Other | Source: Ambulatory Visit | Attending: Internal Medicine | Admitting: Internal Medicine

## 2013-01-22 ENCOUNTER — Encounter (HOSPITAL_COMMUNITY)
Admission: RE | Disposition: A | Discharge status: Home / Self Care | Payer: Self-pay | Source: Ambulatory Visit | Attending: Internal Medicine

## 2013-01-22 ENCOUNTER — Encounter (HOSPITAL_COMMUNITY): Payer: Self-pay | Admitting: *Deleted

## 2013-01-22 ENCOUNTER — Telehealth (INDEPENDENT_AMBULATORY_CARE_PROVIDER_SITE_OTHER): Payer: Self-pay | Admitting: Internal Medicine

## 2013-01-22 DIAGNOSIS — Z1211 Encounter for screening for malignant neoplasm of colon: Secondary | ICD-10-CM | POA: Insufficient documentation

## 2013-01-22 DIAGNOSIS — K5730 Diverticulosis of large intestine without perforation or abscess without bleeding: Secondary | ICD-10-CM | POA: Insufficient documentation

## 2013-01-22 DIAGNOSIS — K2970 Gastritis, unspecified, without bleeding: Secondary | ICD-10-CM

## 2013-01-22 DIAGNOSIS — K219 Gastro-esophageal reflux disease without esophagitis: Secondary | ICD-10-CM

## 2013-01-22 DIAGNOSIS — R1013 Epigastric pain: Secondary | ICD-10-CM | POA: Insufficient documentation

## 2013-01-22 DIAGNOSIS — R634 Abnormal weight loss: Secondary | ICD-10-CM

## 2013-01-22 DIAGNOSIS — K644 Residual hemorrhoidal skin tags: Secondary | ICD-10-CM | POA: Insufficient documentation

## 2013-01-22 DIAGNOSIS — K2960 Other gastritis without bleeding: Secondary | ICD-10-CM

## 2013-01-22 DIAGNOSIS — K2270 Barrett's esophagus without dysplasia: Secondary | ICD-10-CM | POA: Insufficient documentation

## 2013-01-22 DIAGNOSIS — K2940 Chronic atrophic gastritis without bleeding: Secondary | ICD-10-CM | POA: Insufficient documentation

## 2013-01-22 DIAGNOSIS — D126 Benign neoplasm of colon, unspecified: Secondary | ICD-10-CM | POA: Insufficient documentation

## 2013-01-22 DIAGNOSIS — K228 Other specified diseases of esophagus: Secondary | ICD-10-CM

## 2013-01-22 DIAGNOSIS — K2289 Other specified disease of esophagus: Secondary | ICD-10-CM

## 2013-01-22 HISTORY — DX: Gastro-esophageal reflux disease without esophagitis: K21.9

## 2013-01-22 HISTORY — PX: COLONOSCOPY WITH ESOPHAGOGASTRODUODENOSCOPY (EGD): SHX5779

## 2013-01-22 LAB — COMPREHENSIVE METABOLIC PANEL
ALT: 23 U/L (ref 0–53)
AST: 22 U/L (ref 0–37)
Albumin: 4.2 g/dL (ref 3.5–5.2)
Alkaline Phosphatase: 69 U/L (ref 39–117)
BUN: 11 mg/dL (ref 6–23)
CO2: 26 mEq/L (ref 19–32)
Calcium: 9.5 mg/dL (ref 8.4–10.5)
Chloride: 103 mEq/L (ref 96–112)
Creatinine, Ser: 0.76 mg/dL (ref 0.50–1.35)
GFR calc Af Amer: >90 mL/min (ref >90)
GFR calc non Af Amer: >90 mL/min (ref >90)
Glucose, Bld: 119 mg/dL — ABNORMAL HIGH (ref 70–99)
Potassium: 3.8 mEq/L (ref 3.5–5.1)
Sodium: 139 mEq/L (ref 135–145)
Total Bilirubin: 0.3 mg/dL (ref 0.3–1.2)
Total Protein: 6.8 g/dL (ref 6.0–8.3)

## 2013-01-22 SURGERY — COLONOSCOPY WITH ESOPHAGOGASTRODUODENOSCOPY (EGD)
Anesthesia: Moderate Sedation

## 2013-01-22 MED ORDER — MIDAZOLAM HCL 5 MG/5ML IJ SOLN
INTRAMUSCULAR | Status: DC | PRN
  Administered 2013-01-22: 1 mg via INTRAVENOUS
  Administered 2013-01-22: 2 mg via INTRAVENOUS
  Administered 2013-01-22: 1 mg via INTRAVENOUS
  Administered 2013-01-22: 2 mg via INTRAVENOUS

## 2013-01-22 MED ORDER — MIDAZOLAM HCL 5 MG/5ML IJ SOLN
INTRAMUSCULAR | Status: AC
  Filled 2013-01-22: qty 10

## 2013-01-22 MED ORDER — STERILE WATER FOR IRRIGATION IR SOLN
Status: DC | PRN
  Administered 2013-01-22: 13:00:00

## 2013-01-22 MED ORDER — BUTAMBEN-TETRACAINE-BENZOCAINE 2-2-14 % EX AERO
INHALATION_SPRAY | CUTANEOUS | Status: DC | PRN
  Administered 2013-01-22: 2 via TOPICAL

## 2013-01-22 MED ORDER — MEPERIDINE HCL 50 MG/ML IJ SOLN
INTRAMUSCULAR | Status: AC
  Filled 2013-01-22: qty 1

## 2013-01-22 MED ORDER — MEPERIDINE HCL 50 MG/ML IJ SOLN
INTRAMUSCULAR | Status: DC | PRN
  Administered 2013-01-22 (×2): 25 mg via INTRAVENOUS

## 2013-01-22 NOTE — H&P (Addendum)
Christopher [name] is an 67 y.o. male.   Chief Complaint: Patient is here for EGD and colonoscopy. HPI: Patient is 67 year old Caucasian male who presents with 2 month history of epigastric pain unresponsive to therapy. He did have abdominal ultrasound which is negative for cholelithiasis. To begin the pain was daily and now it's intermittent and may last for one after meals. Is described as cramping and gas and bloating. He denies nausea, vomiting or dysphagia. He has lost close to 8 pounds in the last 2 months to. Lately his appetite has not been good. He had been on meloxicam which he took for 2-3 years but stopped about 4 months ago after he had side effects. He said heartburn for over 40 years he's never been undergone EGD. Patient does not smoke cigarettes. He used to drink 3-4 cans by mouth daily until 2 months ago when he quit.  Family history is negative for colorectal carcinoma.  Past Medical History  Diagnosis Date  . GERD (gastroesophageal reflux disease)   . Headache     Past Surgical History  Procedure Date  . Neck surgery     x 3  . Rt shoulder   . Appendectomy     History reviewed. No pertinent family history. Social History:  reports that he has quit smoking. He does not have any smokeless tobacco history on file. He reports that he drinks alcohol. He reports that he does not use illicit drugs.  Allergies:  Allergies  Allergen Reactions  . Morphine And Related Nausea And Vomiting    Medications Prior to Admission  Medication Sig Dispense Refill  . esomeprazole (NEXIUM) 40 MG capsule Take 40 mg by mouth 2 (two) times daily.      . peg 3350 powder (MOVIPREP) 100 G SOLR Take 1 kit (100 g total) by mouth once.  1 kit  0  . docusate sodium (COLACE) 100 MG capsule Take 200 mg by mouth daily.      [name] Zoster Vaccine Live (ZOSTAVAX [place]) Inject into the skin. Received in 2013        No results found for this or any previous visit (from the past 48 hour(s)). No results  found.  ROS  Blood pressure 129/79, pulse 61, temperature 97.7 F (36.5 C), temperature source Oral, resp. rate 24, height 5\' 8"  (1.727 m), weight 148 lb (67.132 kg), SpO2 97.00%. Physical Exam  Constitutional: He appears well-developed and well-nourished.  HENT:  Mouth/Throat: Oropharynx is clear and moist.  Eyes: Conjunctivae normal are normal. No scleral icterus.  Neck: No thyromegaly present.  Cardiovascular: Normal rate, regular rhythm and normal heart sounds.   No murmur heard. Respiratory: Effort normal and breath sounds normal.  GI: He exhibits no distension and no mass. There is no tenderness.       Small umbilical hernia  Musculoskeletal: He exhibits no edema.  Lymphadenopathy:    He has no cervical adenopathy.  Neurological: He is alert.  Skin: Skin is warm and dry.     Assessment/Plan Epigastric pain unresponsive to therapy. Chronic GERD. Are not sick EGD and average risk screening colonoscopy  Maddeline Roorda U 01/22/2013, 1:07 PM

## 2013-01-22 NOTE — Telephone Encounter (Signed)
This has been addressed.

## 2013-01-22 NOTE — Op Note (Signed)
EGD PROCEDURE REPORT  PATIENT:  Christopher [name]  MR#:  [id number] Birthdate:  Jun 23, 1946, 67 y.o., male Endoscopist:  Dr. Malissa Hippo, MD Referred By:  Dr. Ishmael Holter. McInnis, MD Procedure Date: 01/22/2013  Procedure:   EGD & Colonoscopy  Indications:  Patient is 67 year old Caucasian male who has 7 history of heartburn is well controlled with therapy who presents with epigastric pain was of appetite and 8 pound weight loss. Upper abdominal is ultrasound negative for cholelithiasis and UGI series demonstrated small sliding heart hernia otherwise unremarkable examination. She is undergoing diagnostic EGD and screening colonoscopy,            Informed Consent:  The risks, benefits, alternatives & imponderables which include, but are not limited to, bleeding, infection, perforation, drug reaction and potential missed lesion have been reviewed.  The potential for biopsy, lesion removal, esophageal dilation, etc. have also been discussed.  Questions have been answered.  All parties agreeable.  Please see history & physical in medical record for more information.  Medications:  Demerol 50 mg IV Versed 6 mg IV Cetacaine spray topically for oropharyngeal anesthesia  EGD  Description of procedure:  The endoscope was introduced through the mouth and advanced to the second portion of the duodenum without difficulty or limitations. The mucosal surfaces were surveyed very carefully during advancement of the scope and upon withdrawal.  Findings:  Esophagus:  Mucosa of the esophagus was normal with exception of small island of sat salmon-colored Roxanol to GE junction about 3 x 6 mm. GE junction was serrated or wavy. No hernia was noted (sliding hiatal hernia noted on UGIS). GEJ:  39 cm Stomach:  Stomach was empty and distended very well with insufflation. Folds in the proximal stomach were normal. Examination of mucosa at body was normal. There was patchy erythema and single erosion at antrum. No ulcer  crater noted. Pyloric channel was patent. Annularis fundus and cardia were examined by retroflexing the scope and were normal. Duodenum:  Normal bulbar and post bulbar mucosa.  Therapeutic/Diagnostic Maneuvers Performed:  Biopsy taken from the small patch of salmon-colored mucosa at distal esophagus and from GE junction luking for short segment Barrett's esophagus.  COLONOSCOPY Description of procedure:  After a digital rectal exam was performed, that colonoscope was advanced from the anus through the rectum and colon to the area of the cecum, ileocecal valve and appendiceal orifice. The cecum was deeply intubated. These structures were well-seen and photographed for the record. From the level of the cecum and ileocecal valve, the scope was slowly and cautiously withdrawn. The mucosal surfaces were carefully surveyed utilizing scope tip to flexion to facilitate fold flattening as needed. The scope was pulled down into the rectum where a thorough exam including retroflexion was performed.  Findings:   Prep excellent. Single small diverticulum at cecum. Two small polyps ablated via cold biopsy and submitted together. These were located at transverse and descending colon. Normal rectal mucosa. Small hemorrhoids below the dentate line.   Therapeutic/Diagnostic Maneuvers Performed:  See above  Complications:  None  Cecal Withdrawal Time:  9 minutes  Impression:  EGD findings. Serrated GE junction along with small patch of salmon-colored mucosa proximal to GEJ. Biopsy taken from this area to rule out short segment Barrett,s. Erosive antral gastritis. Colonoscopy findings. Small cecal diverticulum. Two small polyps ablated via cold biopsy and submitted together as above. Small external hemorrhoids.   Recommendations:  Patient will continue Nexium at 40 mg by mouth twice a day. Will check  H. pylori serology and comprehensive chemistry panel today. If lab studies are normal will proceed  with abdominal CT. I would be contacting patient with results of blood work and biopsy and further recommendations.  Damontre Millea U  01/22/2013 2:05 PM  CC: Dr. Alice Reichert, MD & Dr. Bonnetta Barry ref. provider found

## 2013-01-23 LAB — H. PYLORI ANTIBODY, IGG: H Pylori IgG: 1.36 {ISR} — ABNORMAL HIGH

## 2013-01-27 ENCOUNTER — Encounter (INDEPENDENT_AMBULATORY_CARE_PROVIDER_SITE_OTHER): Payer: Self-pay | Admitting: Internal Medicine

## 2013-01-27 ENCOUNTER — Telehealth (INDEPENDENT_AMBULATORY_CARE_PROVIDER_SITE_OTHER): Payer: Self-pay | Admitting: *Deleted

## 2013-01-27 ENCOUNTER — Encounter (HOSPITAL_COMMUNITY): Payer: Self-pay | Admitting: Internal Medicine

## 2013-01-27 ENCOUNTER — Other Ambulatory Visit (INDEPENDENT_AMBULATORY_CARE_PROVIDER_SITE_OTHER): Payer: Self-pay | Admitting: Internal Medicine

## 2013-01-27 MED ORDER — BIS SUBCIT-METRONID-TETRACYC 140-125-125 MG PO CAPS: 3.0000 | ORAL_CAPSULE | Freq: Three times a day (TID) | ORAL | Status: DC

## 2013-01-27 NOTE — Telephone Encounter (Signed)
Left message for a call about his tests result from 01/22/2013.

## 2013-01-28 ENCOUNTER — Encounter (INDEPENDENT_AMBULATORY_CARE_PROVIDER_SITE_OTHER): Payer: Self-pay | Admitting: *Deleted

## 2013-01-28 ENCOUNTER — Telehealth (INDEPENDENT_AMBULATORY_CARE_PROVIDER_SITE_OTHER): Payer: Self-pay | Admitting: *Deleted

## 2013-01-28 DIAGNOSIS — A048 Other specified bacterial intestinal infections: Secondary | ICD-10-CM

## 2013-01-28 MED ORDER — AMOXICILL-CLARITHRO-LANSOPRAZ PO MISC: Freq: Two times a day (BID) | ORAL | Status: DC

## 2013-01-28 NOTE — Telephone Encounter (Signed)
Dr. Karilyn Cota told Adien last night if the [facility] to much to return the call and he would see what he could come up with. The Pylera is $180 and they can not afford this. Would like to see what else could be called in. The return phone number is [phone].

## 2013-01-28 NOTE — Telephone Encounter (Signed)
Already addressed yesterday

## 2013-01-28 NOTE — Telephone Encounter (Signed)
I have sent to Terri to address as Dr.Rehman is in Endo. Patient's pharmacy will be notified.

## 2013-01-28 NOTE — Telephone Encounter (Signed)
Patient's insurance would not cover Pylera. Per Dorene Ar the following was called and left on the Pharmacist Voice Mail: Prilosec 20 mg one by mouth twice a day for 14 days Clarithmycin  500 mg one by mouth twice a day for 14 days Amoxcillin 1 gram by mouth twice a day for 14 days

## 2013-01-31 ENCOUNTER — Other Ambulatory Visit: Payer: Self-pay

## 2013-02-12 ENCOUNTER — Encounter (INDEPENDENT_AMBULATORY_CARE_PROVIDER_SITE_OTHER): Payer: Self-pay | Admitting: *Deleted

## 2013-02-24 ENCOUNTER — Ambulatory Visit (INDEPENDENT_AMBULATORY_CARE_PROVIDER_SITE_OTHER): Payer: Medicare Other | Admitting: Internal Medicine

## 2013-02-24 ENCOUNTER — Encounter (INDEPENDENT_AMBULATORY_CARE_PROVIDER_SITE_OTHER): Payer: Self-pay | Admitting: Internal Medicine

## 2013-02-24 VITALS — BP 90/48 | HR 60 | Ht 67.0 in | Wt 141.3 lb

## 2013-02-24 DIAGNOSIS — Z860100 Personal history of colon polyps, unspecified: Secondary | ICD-10-CM

## 2013-02-24 DIAGNOSIS — K2270 Barrett's esophagus without dysplasia: Secondary | ICD-10-CM

## 2013-02-24 NOTE — Progress Notes (Signed)
Subjective:     Patient ID: Christopher [name], male   DOB: [birth year], 67 y.o.   MRN: [id number]  HPIHere today for f/u after recently undergoing an EGD and a colonscopy. Found to be H. Pylori positive and has been treated. He tells me he continues to have stomach cramps.  If he eats spicy foods, he will have a crampy type feeling in his epigastric region.  Appetite is good. He has lost weight. He thinks the weight loss is from the antibiotics he was taking for the H. Pylori. BMs are normal. No melena or bright red rectal bleeding. Usually ha a BM about once a day,.  Esophageal biopsy shows Barrett's esophagus. No dysplasia identified. Both polyps are tubular adenomas. H. pylori serology is positive. Prescription for pilaris into his pharmacy. Next colonoscopy in 5 years. Office visit in 4 weeks All reports to PCP   Weight last visit 148  01/22/2013 EGD/Colonoscopy: Impression:  EGD findings.  Serrated GE junction along with small patch of salmon-colored mucosa proximal to GEJ. Biopsy taken from this area to rule out short segment Barrett,s.  Erosive antral gastritis.  Colonoscopy findings.  Small cecal diverticulum.  Two small polyps ablated via cold biopsy and submitted together as above.   Esophageal biopsy shows Barrett's esophagus. No dysplasia identified. Both polyps are tubular adenomas. H. pylori serology is positive. Prescription for pilaris into his pharmacy. Next colonoscopy in 5 years. Office visit in 4 weeks All reports to PCP      Small external hemorrhoids.  Upper GI series with KUB 12/16/2012 for abdominal pain, epigastric pain 12/16/2012  Small hiatal hernia. Small; amt of GE reflux.     Review of Systems see hpi Current Outpatient Prescriptions  Medication Sig Dispense Refill  . ALPRAZolam (XANAX) 1 MG tablet Take 1 mg by mouth at bedtime as needed for sleep.      [name] docusate sodium (COLACE) 100 MG capsule Take 200 mg by mouth daily.      [name] esomeprazole  (NEXIUM) 40 MG capsule Take 40 mg by mouth 2 (two) times daily.      . pregabalin (LYRICA) 75 MG capsule Take 75 mg by mouth 2 (two) times daily.      [name] Zoster Vaccine Live (ZOSTAVAX [place]) Inject into the skin. Received in 2013       No current facility-administered medications for this visit.   Allergies  Allergen Reactions  . Morphine And Related Nausea And Vomiting   Past Surgical History  Procedure Laterality Date  . Neck surgery      x 3  . Rt shoulder    . Appendectomy    . Colonoscopy with esophagogastroduodenoscopy (egd) N/A 01/22/2013    Procedure: COLONOSCOPY WITH ESOPHAGOGASTRODUODENOSCOPY (EGD);  Surgeon: Malissa Hippo, MD;  Location: [facility];  Service: Endoscopy;  Laterality: N/A;  140   Past Medical History  Diagnosis Date  . GERD (gastroesophageal reflux disease)   . Headache          Objective:   Physical Exam  Filed Vitals:   02/24/13 1050  BP: 90/48  Pulse: 60  Height: 5\' 7"  (1.702 m)  Weight: 141 lb 4.8 oz (64.093 kg)  Alert and oriented. Skin warm and dry. Oral mucosa is moist.   . Sclera anicteric, conjunctivae is pink. Thyroid not enlarged. No cervical lymphadenopathy. Lungs clear. Heart regular rate and rhythm.  Abdomen is soft. Bowel sounds are positive. No hepatomegaly. No abdominal masses felt. No tenderness.  No edema to  lower extremities.        Assessment:   GERD, H. Pylori positive. Seems to be doing better. He has had weight loss but probably from antibiotic therapy for H. Pylori.    Plan:    Take Nexium 30 minutes before meals.

## 2013-02-24 NOTE — Patient Instructions (Addendum)
Continue the Nexium BID. OV in 1 yr.  Follow GERD diet

## 2013-07-22 ENCOUNTER — Other Ambulatory Visit: Payer: Self-pay

## 2013-10-22 ENCOUNTER — Other Ambulatory Visit: Payer: Self-pay

## 2013-12-02 ENCOUNTER — Encounter (INDEPENDENT_AMBULATORY_CARE_PROVIDER_SITE_OTHER): Payer: Self-pay | Admitting: *Deleted

## 2013-12-29 ENCOUNTER — Other Ambulatory Visit: Payer: Self-pay | Admitting: Neurosurgery

## 2013-12-29 DIAGNOSIS — M5416 Radiculopathy, lumbar region: Secondary | ICD-10-CM

## 2014-01-04 ENCOUNTER — Ambulatory Visit
Admission: RE | Admit: 2014-01-04 | Discharge: 2014-01-04 | Disposition: A | Discharge status: Home / Self Care | Payer: Medicare Other | Source: Ambulatory Visit | Attending: Neurosurgery | Admitting: Neurosurgery

## 2014-01-04 DIAGNOSIS — M5416 Radiculopathy, lumbar region: Secondary | ICD-10-CM

## 2014-01-04 MED ORDER — IOHEXOL 300 MG/ML  SOLN
10.0000 mL | Freq: Once | INTRAMUSCULAR | Status: AC | PRN
  Administered 2014-01-04: 10 mL via INTRATHECAL

## 2014-01-04 NOTE — Discharge Instructions (Signed)

## 2014-02-10 ENCOUNTER — Other Ambulatory Visit: Payer: Self-pay | Admitting: Neurosurgery

## 2014-02-19 ENCOUNTER — Encounter (HOSPITAL_COMMUNITY): Payer: Self-pay | Admitting: Pharmacy Technician

## 2014-02-24 NOTE — Pre-Procedure Instructions (Signed)
Christopher [name]  02/24/2014   Your procedure is scheduled on:  Fri, Mar 20 @ 11:00 AM  Report to [facility]  at 8:00 AM.  Call this number if you have problems the morning of surgery: [phone]   Remember:   Do not eat food or drink liquids after midnight.   Take these medicines the morning of surgery with A SIP OF WATER: Nexium(Esomeprazole),Pain Pill(if needed),and Lyrica(Pregabalin)              Stop taking your Naproxen and Fish Oil. No Goody's,BC's,Aspirin,Ibuprofen,or any Herbal Medications   Do not wear jewelry  Do not wear lotions, powders, or colognes. You may wear deodorant.  Men may shave face and neck.  Do not bring valuables to the hospital.  [facility] is not responsible                  for any belongings or valuables.               Contacts, dentures or bridgework may not be worn into surgery.  Leave suitcase in the car. After surgery it may be brought to your room.  For patients admitted to the hospital, discharge time is determined by your                treatment team.               Special Instructions:  [place] - Preparing for Surgery  Before surgery, you can play an important role.  Because skin is not sterile, your skin needs to be as free of germs as possible.  You can reduce the number of germs on you skin by washing with CHG (chlorahexidine gluconate) soap before surgery.  CHG is an antiseptic cleaner which kills germs and bonds with the skin to continue killing germs even after washing.  Please DO NOT use if you have an allergy to CHG or antibacterial soaps.  If your skin becomes reddened/irritated stop using the CHG and inform your nurse when you arrive at Short Stay.  Do not shave (including legs and underarms) for at least 48 hours prior to the first CHG shower.  You may shave your face.  Please follow these instructions carefully:   1.  Shower with CHG Soap the night before surgery and the                                morning of  Surgery.  2.  If you choose to wash your hair, wash your hair first as usual with your       normal shampoo.  3.  After you shampoo, rinse your hair and body thoroughly to remove the                      Shampoo.  4.  Use CHG as you would any other liquid soap.  You can apply chg directly       to the skin and wash gently with scrungie or a clean washcloth.  5.  Apply the CHG Soap to your body ONLY FROM THE NECK DOWN.        Do not use on open wounds or open sores.  Avoid contact with your eyes,       ears, mouth and genitals (private parts).  Wash genitals (private parts)       with your normal soap.  6.  Wash thoroughly, paying special attention to the area where your surgery        will be performed.  7.  Thoroughly rinse your body with warm water from the neck down.  8.  DO NOT shower/wash with your normal soap after using and rinsing off       the CHG Soap.  9.  Pat yourself dry with a clean towel.            10.  Wear clean pajamas.            11.  Place clean sheets on your bed the night of your first shower and do not        sleep with pets.  Day of Surgery  Do not apply any lotions/deoderants the morning of surgery.  Please wear clean clothes to the hospital/surgery center.     Please read over the following fact sheets that you were given: Pain Booklet, Coughing and Deep Breathing, Blood Transfusion Information, MRSA Information and Surgical Site Infection Prevention

## 2014-02-25 ENCOUNTER — Encounter (HOSPITAL_COMMUNITY)
Admission: RE | Admit: 2014-02-25 | Discharge: 2014-02-25 | Disposition: A | Discharge status: Home / Self Care | Payer: Medicare Other | Source: Ambulatory Visit | Attending: Neurosurgery | Admitting: Neurosurgery

## 2014-02-25 ENCOUNTER — Encounter (HOSPITAL_COMMUNITY): Payer: Self-pay

## 2014-02-25 DIAGNOSIS — Z01812 Encounter for preprocedural laboratory examination: Secondary | ICD-10-CM | POA: Insufficient documentation

## 2014-02-25 HISTORY — DX: Personal history of other diseases of the digestive system: Z87.19

## 2014-02-25 HISTORY — DX: Unspecified osteoarthritis, unspecified site: M19.90

## 2014-02-25 LAB — ABO/RH: ABO/RH(D): O POS

## 2014-02-25 LAB — BASIC METABOLIC PANEL
BUN: 10 mg/dL (ref 6–23)
CO2: 27 mEq/L (ref 19–32)
Calcium: 9.1 mg/dL (ref 8.4–10.5)
Chloride: 104 mEq/L (ref 96–112)
Creatinine, Ser: 0.8 mg/dL (ref 0.50–1.35)
GFR calc Af Amer: >90 mL/min (ref >90)
GFR calc non Af Amer: 89 mL/min — ABNORMAL LOW (ref >90)
Glucose, Bld: 81 mg/dL (ref 70–99)
Potassium: 4 mEq/L (ref 3.7–5.3)
Sodium: 143 mEq/L (ref 137–147)

## 2014-02-25 LAB — CBC
HCT: 41.5 % (ref 39.0–52.0)
Hemoglobin: 14.1 g/dL (ref 13.0–17.0)
MCH: 31 pg (ref 26.0–34.0)
MCHC: 34 g/dL (ref 30.0–36.0)
MCV: 91.2 fL (ref 78.0–100.0)
Platelets: 217 10*3/uL (ref 150–400)
RBC: 4.55 MIL/uL (ref 4.22–5.81)
RDW: 12.9 % (ref 11.5–15.5)
WBC: 4.2 10*3/uL (ref 4.0–10.5)

## 2014-02-25 LAB — SURGICAL PCR SCREEN
MRSA, PCR: NEGATIVE
Staphylococcus aureus: POSITIVE — AB

## 2014-02-25 LAB — TYPE AND SCREEN
ABO/RH(D): O POS
Antibody Screen: NEGATIVE

## 2014-02-25 NOTE — Progress Notes (Signed)
Spoke with Mr Cacioppo about positive results of PCR screen.  Results show positive for MSSA.  Prescription was  Called into [facility] in [place], Mr Reicher was informed.

## 2014-02-25 NOTE — Progress Notes (Signed)
PCP is Dr Starr Sinclair. McInnis Denies having a cardiologist, but card cath noted in epic from 2006 with Dr.Berry Pt states that he had the card cath done due to weakness in his legs and chest pain while he was at work. Denies having an echo, he is unsure if he has has a stress test or not.  Request sent to Dr Everette Rank to check on if he has has a stress test or recent CXR or EKG.

## 2014-03-01 ENCOUNTER — Ambulatory Visit (INDEPENDENT_AMBULATORY_CARE_PROVIDER_SITE_OTHER): Payer: Medicare Other | Admitting: Internal Medicine

## 2014-03-04 MED ORDER — CEFAZOLIN SODIUM-DEXTROSE 2-3 GM-% IV SOLR
2.0000 g | INTRAVENOUS | Status: DC
  Filled 2014-03-04: qty 50

## 2014-03-04 NOTE — H&P (Signed)
Christopher [name] is an 68 y.o. male.   Chief Complaint: lumbar pain HPI: patient complaining of lumbar pain with radiation to the left leg associated with sensory changes and weakness. About 11 years ago he had a lumbar mri which showed stenosis at l4-5. He had a cervical and lumbar myelogram and because of the changes he wants to go ahead with surgery.  Past Medical History  Diagnosis Date  . GERD (gastroesophageal reflux disease)   . Headache(784.0)   . Blood in urine 02-2014  . H/O hiatal hernia   . Arthritis     Past Surgical History  Procedure Laterality Date  . Neck surgery      x 3  . Rt shoulder    . Appendectomy    . Colonoscopy with esophagogastroduodenoscopy (egd) N/A 01/22/2013    Procedure: COLONOSCOPY WITH ESOPHAGOGASTRODUODENOSCOPY (EGD);  Surgeon: [name], MD;  Location: [facility];  Service: Endoscopy;  Laterality: N/A;  140  . Carpal tunnel release Bilateral     No family history on file. Social History:  reports that he has quit smoking. He does not have any smokeless tobacco history on file. He reports that he drinks alcohol. He reports that he does not use illicit drugs.  Allergies:  Allergies  Allergen Reactions  . Morphine And Related Nausea And Vomiting    No prescriptions prior to admission    No results found for this or any previous visit (from the past 48 hour(s)). No results found.  Review of Systems  Constitutional: Negative.   Eyes: Negative.   Respiratory: Negative.   Cardiovascular: Negative.   Gastrointestinal: Negative.        Indigestion  Genitourinary: Negative.   Musculoskeletal: Positive for back pain and neck pain.  Skin: Negative.   Neurological: Positive for sensory change and focal weakness.  Endo/Heme/Allergies: Negative.   Psychiatric/Behavioral: Negative.     There were no vitals taken for this visit. Physical Exam hent, nl. Neck, anterior scar from previous surgery. Cv, nl. Lungs, clear. Abdomen, soft.  Extremities, scar in hands. Neuro, weakness of DF both feet. Numbness at the l5 dermatomes. Lumbar myelogram showe severe stenosis at lumbar 4-5 with spondylolisthesis,. The cerviacl area shows ddd at c7-t1  Assessment/Plan Patient to go ahead with decompression and fusion with cages and screwa at L4-5. He and his wife are  Aware of risks and benefits  Marlayna Bannister M 03/04/2014, 8:27 PM

## 2014-03-05 ENCOUNTER — Inpatient Hospital Stay (HOSPITAL_COMMUNITY): Payer: Medicare Other

## 2014-03-05 ENCOUNTER — Encounter (HOSPITAL_COMMUNITY)
Admission: RE | Disposition: A | Discharge status: Home / Self Care | Payer: Medicare Other | Source: Ambulatory Visit | Attending: Neurosurgery

## 2014-03-05 ENCOUNTER — Inpatient Hospital Stay (HOSPITAL_COMMUNITY): Payer: Medicare Other | Admitting: Certified Registered Nurse Anesthetist

## 2014-03-05 ENCOUNTER — Encounter (HOSPITAL_COMMUNITY): Payer: Medicare Other | Admitting: Certified Registered Nurse Anesthetist

## 2014-03-05 ENCOUNTER — Encounter (HOSPITAL_COMMUNITY): Payer: Self-pay | Admitting: *Deleted

## 2014-03-05 ENCOUNTER — Inpatient Hospital Stay (HOSPITAL_COMMUNITY)
Admission: RE | Admit: 2014-03-05 | Discharge: 2014-03-11 | DRG: 460 | Disposition: A | Discharge status: Home / Self Care | Payer: Medicare Other | Source: Ambulatory Visit | Attending: Neurosurgery | Admitting: Neurosurgery

## 2014-03-05 DIAGNOSIS — Q762 Congenital spondylolisthesis: Principal | ICD-10-CM

## 2014-03-05 DIAGNOSIS — M5030 Other cervical disc degeneration, unspecified cervical region: Secondary | ICD-10-CM | POA: Diagnosis present

## 2014-03-05 DIAGNOSIS — IMO0002 Reserved for concepts with insufficient information to code with codable children: Secondary | ICD-10-CM | POA: Diagnosis present

## 2014-03-05 DIAGNOSIS — G9741 Accidental puncture or laceration of dura during a procedure: Secondary | ICD-10-CM | POA: Diagnosis present

## 2014-03-05 DIAGNOSIS — Z981 Arthrodesis status: Secondary | ICD-10-CM

## 2014-03-05 DIAGNOSIS — M4316 Spondylolisthesis, lumbar region: Secondary | ICD-10-CM | POA: Diagnosis present

## 2014-03-05 SURGERY — POSTERIOR LUMBAR FUSION 1 LEVEL
Anesthesia: General | Site: Spine Lumbar

## 2014-03-05 MED ORDER — DIPHENHYDRAMINE HCL 12.5 MG/5ML PO ELIX: 12.5000 mg | ORAL_SOLUTION | Freq: Four times a day (QID) | ORAL | Status: DC | PRN

## 2014-03-05 MED ORDER — PHENOL 1.4 % MT LIQD: 1.0000 | OROMUCOSAL | Status: DC | PRN

## 2014-03-05 MED ORDER — NEOSTIGMINE METHYLSULFATE 1 MG/ML IJ SOLN
INTRAMUSCULAR | Status: DC | PRN
  Administered 2014-03-05: 3.5 mg via INTRAVENOUS

## 2014-03-05 MED ORDER — NALOXONE HCL 0.4 MG/ML IJ SOLN: 0.4000 mg | INTRAMUSCULAR | Status: DC | PRN

## 2014-03-05 MED ORDER — ONDANSETRON HCL 4 MG/2ML IJ SOLN
4.0000 mg | INTRAMUSCULAR | Status: DC | PRN
  Administered 2014-03-06 – 2014-03-09 (×3): 4 mg via INTRAVENOUS
  Filled 2014-03-05 (×3): qty 2

## 2014-03-05 MED ORDER — SODIUM CHLORIDE 0.9 % IJ SOLN: 9.0000 mL | INTRAMUSCULAR | Status: DC | PRN

## 2014-03-05 MED ORDER — ONDANSETRON HCL 4 MG/2ML IJ SOLN: 4.0000 mg | Freq: Four times a day (QID) | INTRAMUSCULAR | Status: DC | PRN

## 2014-03-05 MED ORDER — FENTANYL 10 MCG/ML IV SOLN: INTRAVENOUS | Status: DC

## 2014-03-05 MED ORDER — PANTOPRAZOLE SODIUM 40 MG PO TBEC
40.0000 mg | DELAYED_RELEASE_TABLET | Freq: Every day | ORAL | Status: DC
  Administered 2014-03-05 – 2014-03-11 (×7): 40 mg via ORAL
  Filled 2014-03-05 (×6): qty 1

## 2014-03-05 MED ORDER — SODIUM CHLORIDE 0.9 % IV SOLN
INTRAVENOUS | Status: DC
  Administered 2014-03-06 (×2): via INTRAVENOUS
  Administered 2014-03-07: 1000 mL via INTRAVENOUS
  Administered 2014-03-07: 10 mL/h via INTRAVENOUS

## 2014-03-05 MED ORDER — MIDAZOLAM HCL 2 MG/2ML IJ SOLN
INTRAMUSCULAR | Status: AC
  Filled 2014-03-05: qty 2

## 2014-03-05 MED ORDER — 0.9 % SODIUM CHLORIDE (POUR BTL) OPTIME
TOPICAL | Status: DC | PRN
  Administered 2014-03-05: 1000 mL

## 2014-03-05 MED ORDER — ONDANSETRON HCL 4 MG/2ML IJ SOLN
INTRAMUSCULAR | Status: AC
  Filled 2014-03-05: qty 2

## 2014-03-05 MED ORDER — ONDANSETRON HCL 4 MG/2ML IJ SOLN
INTRAMUSCULAR | Status: DC | PRN
  Administered 2014-03-05: 4 mg via INTRAVENOUS

## 2014-03-05 MED ORDER — PROMETHAZINE HCL 25 MG/ML IJ SOLN: 6.2500 mg | INTRAMUSCULAR | Status: DC | PRN

## 2014-03-05 MED ORDER — MIDAZOLAM HCL 5 MG/5ML IJ SOLN
INTRAMUSCULAR | Status: DC | PRN
  Administered 2014-03-05: 2 mg via INTRAVENOUS

## 2014-03-05 MED ORDER — DIAZEPAM 5 MG PO TABS
5.0000 mg | ORAL_TABLET | Freq: Four times a day (QID) | ORAL | Status: DC | PRN
  Administered 2014-03-05 – 2014-03-11 (×13): 5 mg via ORAL
  Filled 2014-03-05 (×12): qty 1

## 2014-03-05 MED ORDER — MIDAZOLAM HCL 2 MG/2ML IJ SOLN: 0.5000 mg | Freq: Once | INTRAMUSCULAR | Status: DC | PRN

## 2014-03-05 MED ORDER — BUPIVACAINE LIPOSOME 1.3 % IJ SUSP
20.0000 mL | Freq: Once | INTRAMUSCULAR | Status: DC
  Filled 2014-03-05: qty 20

## 2014-03-05 MED ORDER — ACETAMINOPHEN 325 MG PO TABS
650.0000 mg | ORAL_TABLET | ORAL | Status: DC | PRN
  Administered 2014-03-07 – 2014-03-09 (×3): 650 mg via ORAL
  Filled 2014-03-05 (×4): qty 2

## 2014-03-05 MED ORDER — LIDOCAINE HCL (CARDIAC) 20 MG/ML IV SOLN
INTRAVENOUS | Status: DC | PRN
  Administered 2014-03-05: 20 mg via INTRAVENOUS

## 2014-03-05 MED ORDER — DOCUSATE SODIUM 100 MG PO CAPS
200.0000 mg | ORAL_CAPSULE | Freq: Every day | ORAL | Status: DC
  Administered 2014-03-05 – 2014-03-11 (×7): 200 mg via ORAL
  Filled 2014-03-05 (×7): qty 2

## 2014-03-05 MED ORDER — BUPIVACAINE LIPOSOME 1.3 % IJ SUSP
INTRAMUSCULAR | Status: DC | PRN
  Administered 2014-03-05: 20 mL

## 2014-03-05 MED ORDER — SODIUM CHLORIDE 0.9 % IJ SOLN
3.0000 mL | Freq: Two times a day (BID) | INTRAMUSCULAR | Status: DC
Start: 2014-03-05 — End: 2014-03-11
  Administered 2014-03-05 – 2014-03-10 (×10): 3 mL via INTRAVENOUS
  Filled 2014-03-05: qty 3

## 2014-03-05 MED ORDER — SODIUM CHLORIDE 0.9 % IV SOLN: 250.0000 mL | INTRAVENOUS | Status: DC

## 2014-03-05 MED ORDER — OXYCODONE HCL 5 MG PO TABS: 5.0000 mg | ORAL_TABLET | Freq: Once | ORAL | Status: DC | PRN

## 2014-03-05 MED ORDER — PROPOFOL 10 MG/ML IV BOLUS
INTRAVENOUS | Status: AC
  Filled 2014-03-05: qty 20

## 2014-03-05 MED ORDER — ROCURONIUM BROMIDE 50 MG/5ML IV SOLN
INTRAVENOUS | Status: AC
  Filled 2014-03-05: qty 1

## 2014-03-05 MED ORDER — LACTATED RINGERS IV SOLN
INTRAVENOUS | Status: DC
  Administered 2014-03-05: 08:00:00 via INTRAVENOUS

## 2014-03-05 MED ORDER — OXYCODONE-ACETAMINOPHEN 5-325 MG PO TABS
1.0000 | ORAL_TABLET | ORAL | Status: DC | PRN
  Administered 2014-03-05: 2 via ORAL
  Administered 2014-03-07: 1 via ORAL
  Administered 2014-03-08 – 2014-03-09 (×5): 2 via ORAL
  Administered 2014-03-09: 1 via ORAL
  Administered 2014-03-09 – 2014-03-11 (×8): 2 via ORAL
  Filled 2014-03-05 (×9): qty 2
  Filled 2014-03-05 (×2): qty 1
  Filled 2014-03-05 (×4): qty 2

## 2014-03-05 MED ORDER — ONDANSETRON HCL 4 MG/2ML IJ SOLN
4.0000 mg | Freq: Four times a day (QID) | INTRAMUSCULAR | Status: DC | PRN
  Administered 2014-03-05: 4 mg via INTRAVENOUS

## 2014-03-05 MED ORDER — GLYCOPYRROLATE 0.2 MG/ML IJ SOLN
INTRAMUSCULAR | Status: DC | PRN
  Administered 2014-03-05: .6 mg via INTRAVENOUS

## 2014-03-05 MED ORDER — THROMBIN 20000 UNITS EX SOLR
CUTANEOUS | Status: DC | PRN
  Administered 2014-03-05: 13:00:00 via TOPICAL

## 2014-03-05 MED ORDER — DIPHENHYDRAMINE HCL 50 MG/ML IJ SOLN: 12.5000 mg | Freq: Four times a day (QID) | INTRAMUSCULAR | Status: DC | PRN

## 2014-03-05 MED ORDER — SODIUM CHLORIDE 0.9 % IJ SOLN: 3.0000 mL | INTRAMUSCULAR | Status: DC | PRN

## 2014-03-05 MED ORDER — EPHEDRINE SULFATE 50 MG/ML IJ SOLN
INTRAMUSCULAR | Status: DC | PRN
  Administered 2014-03-05: 5 mg via INTRAVENOUS
  Administered 2014-03-05 (×2): 10 mg via INTRAVENOUS

## 2014-03-05 MED ORDER — ROCURONIUM BROMIDE 100 MG/10ML IV SOLN
INTRAVENOUS | Status: DC | PRN
  Administered 2014-03-05: 50 mg via INTRAVENOUS

## 2014-03-05 MED ORDER — OXYCODONE HCL 5 MG/5ML PO SOLN: 5.0000 mg | Freq: Once | ORAL | Status: DC | PRN

## 2014-03-05 MED ORDER — MEPERIDINE HCL 25 MG/ML IJ SOLN: 6.2500 mg | INTRAMUSCULAR | Status: DC | PRN

## 2014-03-05 MED ORDER — LIDOCAINE HCL (CARDIAC) 20 MG/ML IV SOLN
INTRAVENOUS | Status: AC
  Filled 2014-03-05: qty 5

## 2014-03-05 MED ORDER — FENTANYL CITRATE 0.05 MG/ML IJ SOLN
INTRAMUSCULAR | Status: DC | PRN
  Administered 2014-03-05: 200 ug via INTRAVENOUS
  Administered 2014-03-05: 50 ug via INTRAVENOUS

## 2014-03-05 MED ORDER — PREGABALIN 75 MG PO CAPS
75.0000 mg | ORAL_CAPSULE | Freq: Two times a day (BID) | ORAL | Status: DC
  Administered 2014-03-05 – 2014-03-11 (×12): 75 mg via ORAL
  Filled 2014-03-05 (×12): qty 1

## 2014-03-05 MED ORDER — HYDROMORPHONE HCL PF 1 MG/ML IJ SOLN: 0.2500 mg | INTRAMUSCULAR | Status: DC | PRN

## 2014-03-05 MED ORDER — FENTANYL 10 MCG/ML IV SOLN
INTRAVENOUS | Status: DC
  Administered 2014-03-05: 16:00:00 via INTRAVENOUS
  Administered 2014-03-05: 13 ug via INTRAVENOUS
  Administered 2014-03-06 (×3): 30 ug via INTRAVENOUS
  Administered 2014-03-07: 45 ug via INTRAVENOUS
  Filled 2014-03-05 (×2): qty 50

## 2014-03-05 MED ORDER — CEFAZOLIN SODIUM 1-5 GM-% IV SOLN
1.0000 g | Freq: Three times a day (TID) | INTRAVENOUS | Status: AC
  Administered 2014-03-05 – 2014-03-06 (×2): 1 g via INTRAVENOUS
  Filled 2014-03-05 (×3): qty 50

## 2014-03-05 MED ORDER — FENTANYL CITRATE 0.05 MG/ML IJ SOLN
INTRAMUSCULAR | Status: AC
  Filled 2014-03-05: qty 5

## 2014-03-05 MED ORDER — LACTATED RINGERS IV SOLN
INTRAVENOUS | Status: DC | PRN
  Administered 2014-03-05 (×2): via INTRAVENOUS

## 2014-03-05 MED ORDER — DIAZEPAM 5 MG PO TABS
ORAL_TABLET | ORAL | Status: AC
  Filled 2014-03-05: qty 1

## 2014-03-05 MED ORDER — MENTHOL 3 MG MT LOZG: 1.0000 | LOZENGE | OROMUCOSAL | Status: DC | PRN

## 2014-03-05 MED ORDER — PROPOFOL 10 MG/ML IV BOLUS
INTRAVENOUS | Status: DC | PRN
  Administered 2014-03-05: 120 mg via INTRAVENOUS

## 2014-03-05 MED ORDER — ACETAMINOPHEN 650 MG RE SUPP: 650.0000 mg | RECTAL | Status: DC | PRN

## 2014-03-05 MED ORDER — ZOLPIDEM TARTRATE 5 MG PO TABS
5.0000 mg | ORAL_TABLET | Freq: Every evening | ORAL | Status: DC | PRN
  Administered 2014-03-06 – 2014-03-08 (×2): 5 mg via ORAL
  Filled 2014-03-05 (×2): qty 1

## 2014-03-05 MED ORDER — OXYCODONE-ACETAMINOPHEN 5-325 MG PO TABS
ORAL_TABLET | ORAL | Status: AC
  Filled 2014-03-05: qty 2

## 2014-03-05 SURGICAL SUPPLY — 69 items
BENZOIN TINCTURE PRP APPL 2/3 (GAUZE/BANDAGES/DRESSINGS) ×2 IMPLANT
BLADE SURG ROTATE 9660 (MISCELLANEOUS) IMPLANT
BUR ACORN 6.0 (BURR) ×2 IMPLANT
BUR MATCHSTICK NEURO 3.0 LAGG (BURR) ×2 IMPLANT
CANISTER SUCT 3000ML (MISCELLANEOUS) ×2 IMPLANT
CAP REVERE LOCKING (Cap) ×8 IMPLANT
CONT SPEC 4OZ CLIKSEAL STRL BL (MISCELLANEOUS) ×2 IMPLANT
COVER BACK TABLE 24X17X13 BIG (DRAPES) IMPLANT
COVER TABLE BACK 60X90 (DRAPES) ×2 IMPLANT
DRAPE C-ARM 42X72 X-RAY (DRAPES) ×4 IMPLANT
DRAPE LAPAROTOMY 100X72X124 (DRAPES) ×2 IMPLANT
DRAPE POUCH INSTRU U-SHP 10X18 (DRAPES) ×2 IMPLANT
DRSG OPSITE POSTOP 4X6 (GAUZE/BANDAGES/DRESSINGS) ×2 IMPLANT
DURAPREP 26ML APPLICATOR (WOUND CARE) ×2 IMPLANT
ELECT REM PT RETURN 9FT ADLT (ELECTROSURGICAL) ×2
ELECTRODE REM PT RTRN 9FT ADLT (ELECTROSURGICAL) ×1 IMPLANT
EVACUATOR 1/8 PVC DRAIN (DRAIN) ×2 IMPLANT
GAUZE SPONGE 4X4 16PLY XRAY LF (GAUZE/BANDAGES/DRESSINGS) ×2 IMPLANT
GLOVE BIO SURGEON STRL SZ8 (GLOVE) ×2 IMPLANT
GLOVE BIOGEL M 8.0 STRL (GLOVE) ×4 IMPLANT
GLOVE BIOGEL PI IND STRL 7.0 (GLOVE) ×2 IMPLANT
GLOVE BIOGEL PI IND STRL 7.5 (GLOVE) ×1 IMPLANT
GLOVE BIOGEL PI IND STRL 8 (GLOVE) ×3 IMPLANT
GLOVE BIOGEL PI INDICATOR 7.0 (GLOVE) ×2
GLOVE BIOGEL PI INDICATOR 7.5 (GLOVE) ×1
GLOVE BIOGEL PI INDICATOR 8 (GLOVE) ×3
GLOVE ECLIPSE 7.5 STRL STRAW (GLOVE) ×16 IMPLANT
GLOVE EXAM NITRILE LRG STRL (GLOVE) IMPLANT
GLOVE EXAM NITRILE MD LF STRL (GLOVE) IMPLANT
GLOVE EXAM NITRILE XL STR (GLOVE) IMPLANT
GLOVE EXAM NITRILE XS STR PU (GLOVE) IMPLANT
GOWN BRE IMP SLV AUR LG STRL (GOWN DISPOSABLE) ×6 IMPLANT
GOWN BRE IMP SLV AUR XL STRL (GOWN DISPOSABLE) ×4 IMPLANT
GOWN STRL REIN 2XL LVL4 (GOWN DISPOSABLE) ×2 IMPLANT
KIT BASIN OR (CUSTOM PROCEDURE TRAY) ×2 IMPLANT
KIT ROOM TURNOVER OR (KITS) ×2 IMPLANT
NEEDLE HYPO 18GX1.5 BLUNT FILL (NEEDLE) IMPLANT
NEEDLE HYPO 21X1.5 SAFETY (NEEDLE) ×2 IMPLANT
NEEDLE HYPO 25X1 1.5 SAFETY (NEEDLE) ×2 IMPLANT
NS IRRIG 1000ML POUR BTL (IV SOLUTION) ×2 IMPLANT
PACK FOAM VITOSS 10CC (Orthopedic Implant) ×2 IMPLANT
PACK LAMINECTOMY NEURO (CUSTOM PROCEDURE TRAY) ×2 IMPLANT
PAD ABD 8X10 STRL (GAUZE/BANDAGES/DRESSINGS) IMPLANT
PAD ARMBOARD 7.5X6 YLW CONV (MISCELLANEOUS) ×10 IMPLANT
PATTIES SURGICAL .5 X1 (DISPOSABLE) ×2 IMPLANT
PATTIES SURGICAL .5 X3 (DISPOSABLE) IMPLANT
PATTIES SURGICAL 1X1 (DISPOSABLE) ×2 IMPLANT
ROD REVERE 6.35 40MM (Rod) ×4 IMPLANT
SCREW REVERE 5.5X45 (Screw) ×8 IMPLANT
SENSORCAINE 0.5% W/EPI IMPLANT
SPACER SUSTAIN O SML 8X22 12MM (Spacer) ×4 IMPLANT
SPONGE GAUZE 4X4 12PLY (GAUZE/BANDAGES/DRESSINGS) ×2 IMPLANT
SPONGE LAP 4X18 X RAY DECT (DISPOSABLE) IMPLANT
SPONGE NEURO XRAY DETECT 1X3 (DISPOSABLE) IMPLANT
SPONGE SURGIFOAM ABS GEL 100 (HEMOSTASIS) ×2 IMPLANT
STRIP CLOSURE SKIN 1/2X4 (GAUZE/BANDAGES/DRESSINGS) ×2 IMPLANT
SUT PROLENE 6 0 BV (SUTURE) ×2 IMPLANT
SUT VIC AB 1 CT1 18XBRD ANBCTR (SUTURE) ×1 IMPLANT
SUT VIC AB 1 CT1 8-18 (SUTURE) ×1
SUT VIC AB 2-0 CP2 18 (SUTURE) ×2 IMPLANT
SUT VIC AB 3-0 SH 8-18 (SUTURE) ×2 IMPLANT
SYR 20CC LL (SYRINGE) ×2 IMPLANT
SYR 20ML ECCENTRIC (SYRINGE) ×2 IMPLANT
SYR 5ML LL (SYRINGE) IMPLANT
TOWEL OR 17X24 6PK STRL BLUE (TOWEL DISPOSABLE) ×2 IMPLANT
TOWEL OR 17X26 10 PK STRL BLUE (TOWEL DISPOSABLE) ×2 IMPLANT
TRAY FOLEY CATH 14FRSI W/METER (CATHETERS) IMPLANT
TRAY FOLEY CATH 16FRSI W/METER (SET/KITS/TRAYS/PACK) ×2 IMPLANT
WATER STERILE IRR 1000ML POUR (IV SOLUTION) ×2 IMPLANT

## 2014-03-05 NOTE — Progress Notes (Signed)
Utilization review completed.  

## 2014-03-05 NOTE — Anesthesia Preprocedure Evaluation (Addendum)
Anesthesia Evaluation  Patient identified by MRN, date of birth, ID band Patient awake    Reviewed: Allergy & Precautions, H&P , NPO status , Patient's Chart, lab work & pertinent test results  History of Anesthesia Complications Negative for: history of anesthetic complications  Airway Mallampati: II TM Distance: >3 FB Neck ROM: Full    Dental  (+) Edentulous Upper, Edentulous Lower   Pulmonary former smoker,  breath sounds clear to auscultation  Pulmonary exam normal       Cardiovascular negative cardio ROS  Rhythm:Regular Rate:Normal     Neuro/Psych Chronic back pain    GI/Hepatic Neg liver ROS, GERD-  Medicated and Controlled,  Endo/Other  negative endocrine ROS  Renal/GU negative Renal ROS     Musculoskeletal   Abdominal   Peds  Hematology   Anesthesia Other Findings   Reproductive/Obstetrics                         Anesthesia Physical Anesthesia Plan  ASA: II  Anesthesia Plan: General   Post-op Pain Management:    Induction: Intravenous  Airway Management Planned: Oral ETT  Additional Equipment:   Intra-op Plan:   Post-operative Plan: Extubation in OR  Informed Consent: I have reviewed the patients History and Physical, chart, labs and discussed the procedure including the risks, benefits and alternatives for the proposed anesthesia with the patient or authorized representative who has indicated his/her understanding and acceptance.     Plan Discussed with: CRNA, Anesthesiologist and Surgeon  Anesthesia Plan Comments: (Plan routine monitors, GETA)       Anesthesia Quick Evaluation

## 2014-03-05 NOTE — Preoperative (Signed)
Beta Blockers   Reason not to administer Beta Blockers:Not Applicable 

## 2014-03-05 NOTE — Transfer of Care (Signed)
Immediate Anesthesia Transfer of Care Note  Patient: Christopher [name]  Procedure(s) Performed: Procedure(s): LUMBAR FOUR-FIVE POSTERIOR LUMBAR INTERBODY FUSION (N/A)  Patient Location: PACU  Anesthesia Type:General  Level of Consciousness: awake, alert  and oriented  Airway & Oxygen Therapy: Patient Spontanous Breathing  Post-op Assessment: Report given to PACU RN  Post vital signs: Reviewed and stable  Complications: No apparent anesthesia complications

## 2014-03-05 NOTE — Progress Notes (Signed)
Op note [id number]

## 2014-03-06 ENCOUNTER — Encounter (HOSPITAL_COMMUNITY): Payer: Self-pay | Admitting: General Practice

## 2014-03-06 MED ORDER — PROCHLORPERAZINE EDISYLATE 5 MG/ML IJ SOLN
5.0000 mg | Freq: Four times a day (QID) | INTRAMUSCULAR | Status: DC | PRN
  Administered 2014-03-06: 10 mg via INTRAVENOUS
  Filled 2014-03-06 (×3): qty 2

## 2014-03-06 NOTE — Op Note (Signed)
Christopher [name], Christopher [name]                 ACCOUNT NO.:  [id number]  MEDICAL RECORD NO.:  [id number]  LOCATION:  [id number]                        FACILITY:  [facility]  PHYSICIAN:  Leeroy Cha, M.D.   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 Neurogenic claudication.  Chronic radiculopathy.  POSTOPERATIVE DIAGNOSIS:  L4-5 spondylolisthesis with severe stenosis. Neurogenic claudication.  Chronic radiculopathy.  PROCEDURE:  L4 __________ which involved laminectomy and facetectomy. Bilaterally left L4-5 diskectomy beyond normal to be able to introduce 2 cages.  Insertion of 2 cages of 12 x 26 with autograft inside, pedicle screws L4 and L5, posterolateral arthrodesis with Vitoss plus autograft. Cell Saver, C-arm.  SURGEON:  Leeroy Cha, M.D.  ASSISTANT:  Sherley Bounds, MD.  CLINICAL HISTORY:  Christopher [name] is a 68 years old gentleman who in the past had anterior cervical fusion by me.  Lately, he had been complaining of back pain with radiation to both legs.  He has outpatient lumbar myelogram which showed grade 2 stenosis and spondylolisthesis at the level of L4-L5.  Also, he has had degenerative disk disease in the cervical area, C7 and T1.  The patient was __________ because the pain in both legs getting worse.  He and his wife knew the risk with the surgery including possibility of infection, hematoma, no improvement, whatsoever, need of further surgery, and CSF leak.  The patient, as I mentioned above, has a lumbar myelogram at the level of L3-L4.  PROCEDURE:  The patient was taken to the OR.  After intubation, he was positioned in a prone manner.  The back was prepped with DuraPrep. Drapes were applied.  Midline incision was made and __________ retraction all the way laterally.  X-rays which were taken during the procedure showed that we  were at the level of L4-L5.  We started __________ removing the spinous process of L4, the lamina, and then the facet.  The patient has quite a bit of narrowing with calcification of the yellow ligament.  Laminotomy to decompress the L4 and L5 space was done.  Then, we entered the disk space first in the right side and then in the left side and total diskectomy all the way laterally was achieved.  The endplate were removed and 2 cages of 12 x 22 were inserted.  Then with the help of the C-arm, we filled the pedicle of L4 and L5, and at the end, we were able to introduce 4 screws of 5.5 x 45. During the procedure, there was some spinal fluid coming on the __________ L4.  I did a laminotomy, and we found that there was a small pinhole where the patient had the previous myelogram.  A single stitch of Prolene 6-0 to clear the problem.  From then on, the rod from L4 and L5 screws were used on the right side and later on the left side.  They were secured in place with Capps.  We went laterally, we removed the periosteum of the lateral aspect of the facet of L4-L5 and the proximal transverse process and  a mix of autograft and Vitoss was used for the arthrodesis.  The area was irrigated.  Drain was left in the epidural space, and the wound was closed with Vicryl and Steri-Strips.          ______________________________ Leeroy Cha, M.D.     EB/MEDQ  D:  03/05/2014  T:  03/05/2014  Job:  [id number]

## 2014-03-06 NOTE — Evaluation (Signed)
Physical Therapy Evaluation Patient Details Name: Christopher [name] MRN: [id number] DOB: 03-03-1946 Today's Date: 03/06/2014 Time: 2505-3976 PT Time Calculation (min): 29 min  PT Assessment / Plan / Recommendation History of Present Illness  Christopher [name] is a 68 years old gentleman with recurrent lower back pain and now admitted with s/p L4-L5 decompression and fusion.   Clinical Impression  Patient is s/p lumbar surgery resulting in functional limitations due to the deficits listed below (see PT Problem List). Limited evaluation due to neck pain.  RN aware and hot pack applied for comfort. Pt reported neck pain last surgery in same location for several days post surgery.  Pt was able to sit in chair for ~8 minutes prior to PT/OT assisting pt back to bed.  Patient will benefit from skilled PT to increase their independence and safety with mobility to allow discharge to the venue listed below.      PT Assessment  Patient needs continued PT services    Follow Up Recommendations  Supervision/Assistance - 24 hour;Home health PT (Depending on progress may not need HHPT)    Equipment Recommendations  None recommended by PT    Frequency Min 5X/week    Precautions / Restrictions Precautions Precautions: Back Required Braces or Orthoses: Spinal Brace Spinal Brace: Lumbar corset;Applied in sitting position   Pertinent Vitals/Pain C/o 7-8/10 neck pain with PCA and premedicated; pt also with c/o nausea limiting mobility      Mobility  Bed Mobility Overal bed mobility: + 2 for safety/equipment General bed mobility comments: Pt need minimal assistance with +2 for safety with lines and duet pt nausea and pain.  Minimal assistance to complete sidelying to sit with max cues for proper technique to prevent twisting.  Transfers Overall transfer level: Needs assistance Equipment used: Rolling walker (2 wheeled) Transfers: Sit to/from Stand Sit to Stand: +2 safety/equipment General transfer comment:  Minimal Assistance needed with +2 for safety for lines and due to pt dizziness.  Cues for hand placemend and proper RW position.  Pt continues to keep hands on RW during transfers.  Ambulation/Gait Ambulation/Gait assistance: +2 safety/equipment Ambulation Distance (Feet): 10 Feet (5' x 2) Assistive device: Rolling walker (2 wheeled) Gait Pattern/deviations: Step-to pattern;Decreased stride length;Shuffle;Antalgic Gait velocity: decreased due to pain    Exercises     PT Diagnosis: Difficulty walking;Acute pain  PT Problem List: Decreased activity tolerance;Decreased mobility;Decreased knowledge of use of DME;Decreased knowledge of precautions;Pain PT Treatment Interventions: DME instruction;Gait training;Stair training;Functional mobility training;Therapeutic activities;Therapeutic exercise;Balance training;Patient/family education     PT Goals(Current goals can be found in the care plan section) Acute Rehab PT Goals Patient Stated Goal: To go home PT Goal Formulation: With patient/family Time For Goal Achievement: 03/13/14 Potential to Achieve Goals: Good  Visit Information  Last PT Received On: 03/06/14 Assistance Needed: +2 PT/OT/SLP Co-Evaluation/Treatment: Yes Reason for Co-Treatment: For patient/therapist safety PT goals addressed during session: Mobility/safety with mobility Reason Eval/Treat Not Completed: Medical issues which prohibited therapy;Pain limiting ability to participate (Pt c/o nausea and neck pain limiting prolong sitting) History of Present Illness: Christopher [name] is a 68 years old gentleman with recurrent lower back pain and now admitted with s/p L4-L5 decompression and fusion.        Prior [place] expects to be discharged to:: Private residence Living Arrangements: Spouse/significant other Available Help at Discharge: Family Type of Home: House Home Access: [place]: Laundry or work area in basement;Two  level Alternate Level Stairs-Number of Steps: does not  have to go to basement Home Equipment: Kasandra Knudsen - single point;Walker - 2 wheels;Bedside commode Prior Function Level of Independence: Independent Communication Communication: No difficulties Dominant Hand: Right    Cognition  Cognition Arousal/Alertness: Awake/alert Behavior During Therapy: WFL for tasks assessed/performed Overall Cognitive Status: Within Functional Limits for tasks assessed    Extremity/Trunk Assessment Upper Extremity Assessment Upper Extremity Assessment: Defer to OT evaluation Lower Extremity Assessment Lower Extremity Assessment: Overall WFL for tasks assessed   Balance    End of Session PT - End of Session Equipment Utilized During Treatment: Gait belt;Back brace Activity Tolerance: Patient limited by pain Patient left: in bed;with call bell/phone within reach Nurse Communication: Mobility status;Precautions  GP     Christopher [name] 03/06/2014, 10:39 AM  Antoine Poche, PT DPT [phone]

## 2014-03-06 NOTE — Progress Notes (Signed)
Occupational Therapy Evaluation Patient Details Name: Christopher [name] MRN: [id number] DOB: 1946/09/15 Today's Date: 03/06/2014 Time: 4403-4742 OT Time Calculation (min): 29 min  OT Assessment / Plan / Recommendation History of present illness Christopher [name] is a 68 years old gentleman with recurrent lower back pain and now admitted with s/p L4-L5 decompression and fusion.    Clinical Impression   Pt admitted with above.  Pt will benefit from continued acute OT services to address below problem list. Pt limited today by severe neck pain and nausea. Recommending HHOT for d/c planning. (May not need HHOT pending progress).    OT Assessment  Patient needs continued OT Services    Follow Up Recommendations  Home health OT;Supervision/Assistance - 24 hour    Barriers to Discharge      Equipment Recommendations  None recommended by OT    Recommendations for Other Services    Frequency  Min 2X/week    Precautions / Restrictions Precautions Precautions: Back Precaution Comments: Educated pt on 3/3 back precautions. Required Braces or Orthoses: Spinal Brace Spinal Brace: Lumbar corset;Applied in sitting position   Pertinent Vitals/Pain See vitals    ADL  Grooming: Performed;Wash/dry face;Set up Where Assessed - Grooming: Unsupported sitting Upper Body Dressing: Performed;Maximal assistance Where Assessed - Upper Body Dressing: Unsupported sitting Lower Body Dressing: Performed;Maximal assistance Where Assessed - Lower Body Dressing: Unsupported sitting Toilet Transfer: Simulated;+2 Total assistance Toilet Transfer Method: Sit to stand;Stand pivot Toilet Transfer Equipment: Other (comment) (bed>chair) Equipment Used: Gait belt;Back brace;Rolling walker Transfers/Ambulation Related to ADLs: +2 person with min assist for sit>stand and SPT from bed<>chair. +2 due to pt dizziness and nausea. ADL Comments: Pt limited by nausea and neck pain today.  Once sitting in recliner, pt unable to relax  neck into neutral position but rather keeping it flexed while leaning forward in chair.  Pt sat in chair ~10 minutes and then returned to bed at end of session.       OT Diagnosis: Generalized weakness;Acute pain  OT Problem List: Decreased strength;Decreased activity tolerance;Impaired balance (sitting and/or standing);Decreased knowledge of use of DME or AE;Decreased knowledge of precautions;Pain OT Treatment Interventions: Self-care/ADL training;DME and/or AE instruction;Therapeutic activities;Patient/family education;Balance training   OT Goals(Current goals can be found in the care plan section) Acute Rehab OT Goals Patient Stated Goal: To go home OT Goal Formulation: With patient/family Time For Goal Achievement: 03/13/14 Potential to Achieve Goals: Good  Visit Information  Last OT Received On: 03/06/14 Assistance Needed: +2 PT/OT/SLP Co-Evaluation/Treatment: Yes Reason for Co-Treatment: For patient/therapist safety OT goals addressed during session: ADL's and self-care History of Present Illness: Christopher [name] is a 68 years old gentleman with recurrent lower back pain and now admitted with s/p L4-L5 decompression and fusion.        Prior [place] expects to be discharged to:: Private residence Living Arrangements: Spouse/significant other Available Help at Discharge: Family Type of Home: House Home Access: [place]: Laundry or work area in basement;Two level Alternate Level Stairs-Number of Steps: does not have to go to basement Home Equipment: Cane - single point;Walker - 2 wheels;Bedside commode Prior Function Level of Independence: Independent Communication Communication: No difficulties Dominant Hand: Right         Vision/Perception     Cognition  Cognition Arousal/Alertness: Awake/alert Behavior During Therapy: WFL for tasks assessed/performed Overall Cognitive Status: Within Functional Limits for tasks  assessed    Extremity/Trunk Assessment Upper Extremity Assessment Upper Extremity Assessment: Overall [facility]  for tasks assessed     Mobility Bed Mobility Overal bed mobility: Needs Assistance;+2 for physical assistance Bed Mobility: Rolling;Sidelying to Sit;Sit to Sidelying Rolling: Mod assist Sidelying to sit: +2 for physical assistance;Min assist Sit to sidelying: +2 for physical assistance;Min assist Transfers Overall transfer level: Needs assistance Equipment used: Rolling walker (2 wheeled) Transfers: Sit to/from [employer] Transfers Sit to Stand: +2 physical assistance;Min assist;+2 safety/equipment Stand pivot transfers: +2 safety/equipment;+2 physical assistance;Min assist General transfer comment: Minimal Assistance needed with +2 for safety for lines and due to pt dizziness.  Cues for hand placemend and proper RW position.  Pt continues to keep hands on RW during transfers.      Exercise     Balance     End of Session OT - End of Session Equipment Utilized During Treatment: Back brace;Gait belt;Rolling walker Activity Tolerance: Patient limited by pain (pt limited by nausea) Patient left: in bed;with call bell/phone within reach;with family/visitor present Nurse Communication: Mobility status  GO    03/06/2014 Darrol Jump OTR/L Pager [phone] Office [phone]  Darrol Jump 03/06/2014, 1:56 PM

## 2014-03-06 NOTE — Progress Notes (Signed)
Clinical Education officer, museum (CSW) received referral for SNF placement. CSW met with patient and his girlfriend was at his bedside. Patient reported that the plan is to go home and his girlfriend lives with him and can provide 24 hour assistance. Patient's girlfriend confirmed that she can provide 24 care at home. PT is recommending home health depending on patient's progression. CSW left RN case manager a message making her aware of above. Please reconsult if further social work needs arise. CSW signing off.   Blima Rich, [place] Weekend CSW [phone]

## 2014-03-06 NOTE — Progress Notes (Signed)
Subjective: Patient reports Complains of nausea and feeling weak. Has had some cramping in lower extremities after getting out of bed.  Objective: Vital signs in last 24 hours: Temp:  [97.6 F (36.4 C)-99.1 F (37.3 C)] 99.1 F (37.3 C) (03/21 5638) Pulse Rate:  [57-72] 70 (03/21 0632) Resp:  [15-23] 17 (03/21 0808) BP: (87-135)/(45-70) 107/47 mmHg (03/21 0638) SpO2:  [94 %-100 %] 98 % (03/21 0808)  Intake/Output from previous day: 03/20 0701 - 03/21 0700 In: 1693.8 [I.V.:1443.8; Blood:250] Out: 2155 [Urine:975; Drains:580; Blood:600] Intake/Output this shift:    Dressing is dry drain in place  Lab Results: No results found for this basename: WBC, HGB, HCT, PLT,  in the last 72 hours BMET No results found for this basename: NA, K, CL, CO2, GLUCOSE, BUN, CREATININE, CALCIUM,  in the last 72 hours  Studies/Results: Dg Lumbar Spine 2-3 Views  03/05/2014   CLINICAL DATA:  Lumbar fusion L4-5  EXAM: LUMBAR SPINE - 2-3 VIEW; DG C-ARM 1-60 MIN  COMPARISON:  DG LUMBAR SPINE COMPLETE dated 03/05/2014  FINDINGS: Two intraoperative spot images demonstrate placement of posterior pedicle screws at L4 and L5. No hardware complicating feature noted on these intraoperative spot images.  IMPRESSION: L4-5 posterior fusion.   Electronically Signed   By: Rolm Baptise M.D.   On: 03/05/2014 16:18   Dg C-arm 1-60 Min  03/05/2014   CLINICAL DATA:  Lumbar fusion L4-5  EXAM: LUMBAR SPINE - 2-3 VIEW; DG C-ARM 1-60 MIN  COMPARISON:  DG LUMBAR SPINE COMPLETE dated 03/05/2014  FINDINGS: Two intraoperative spot images demonstrate placement of posterior pedicle screws at L4 and L5. No hardware complicating feature noted on these intraoperative spot images.  IMPRESSION: L4-5 posterior fusion.   Electronically Signed   By: Rolm Baptise M.D.   On: 03/05/2014 16:18    Assessment/Plan: They will postop day 1.  LOS: 1 day  Continues on PCA for pain control.   Shakeda Pearse J 03/06/2014, 10:10 AM

## 2014-03-06 NOTE — Progress Notes (Signed)
Pt c/o shooting neck pain and and headache  nausea and vomiting which get worse  when pt sits up. Zofran 4 mg give prior to   PT working with p.t The patient again c/o of  severe neck pain when in the chair .did not tolerate well and was assisted back to bed by PT after just 10 minutes of being in the chair. Pt encourage to use the PCA to relief pain .warm pack applied to the back of the neck . surgical incision to the back  observed no drainage of CSF leak noted. MD made aware.and pt encourage to lay flat in bed for a while. Compazine 10 ng IV given for the nausea this time.. RN will continue to monitor pt's  progress.

## 2014-03-06 NOTE — Progress Notes (Signed)
Pt's neck pain got better after laying flat in bed for most part of the afternoon. No nausea and vomiting,t tolerated  dinner this evening.condition is stable.

## 2014-03-07 ENCOUNTER — Inpatient Hospital Stay (HOSPITAL_COMMUNITY): Payer: Medicare Other

## 2014-03-07 LAB — CBC WITH DIFFERENTIAL/PLATELET
Basophils Absolute: 0 10*3/uL (ref 0.0–0.1)
Basophils Relative: 0 % (ref 0–1)
Eosinophils Absolute: 0 10*3/uL (ref 0.0–0.7)
Eosinophils Relative: 0 % (ref 0–5)
HCT: 32.8 % — ABNORMAL LOW (ref 39.0–52.0)
Hemoglobin: 11.1 g/dL — ABNORMAL LOW (ref 13.0–17.0)
Lymphocytes Relative: 14 % (ref 12–46)
Lymphs Abs: 1.6 10*3/uL (ref 0.7–4.0)
MCH: 30.8 pg (ref 26.0–34.0)
MCHC: 33.8 g/dL (ref 30.0–36.0)
MCV: 91.1 fL (ref 78.0–100.0)
Monocytes Absolute: 1.1 10*3/uL — ABNORMAL HIGH (ref 0.1–1.0)
Monocytes Relative: 10 % (ref 3–12)
Neutro Abs: 8.5 10*3/uL — ABNORMAL HIGH (ref 1.7–7.7)
Neutrophils Relative %: 76 % (ref 43–77)
Platelets: 180 10*3/uL (ref 150–400)
RBC: 3.6 MIL/uL — ABNORMAL LOW (ref 4.22–5.81)
RDW: 12.7 % (ref 11.5–15.5)
WBC: 11.2 10*3/uL — ABNORMAL HIGH (ref 4.0–10.5)

## 2014-03-07 LAB — URINALYSIS, ROUTINE W REFLEX MICROSCOPIC
Bilirubin Urine: NEGATIVE
Glucose, UA: NEGATIVE mg/dL
Hgb urine dipstick: NEGATIVE
Ketones, ur: NEGATIVE mg/dL
Leukocytes, UA: NEGATIVE
Nitrite: NEGATIVE
Protein, ur: NEGATIVE mg/dL
Specific Gravity, Urine: 1.014 (ref 1.005–1.030)
Urobilinogen, UA: 0.2 mg/dL (ref 0.0–1.0)
pH: 7.5 (ref 5.0–8.0)

## 2014-03-07 NOTE — Progress Notes (Signed)
Patient has 100.1 oral temp gave him tylenol 650 mg will continue to monitor, Dinamap BP was low took manual BP is good 112/62.

## 2014-03-07 NOTE — Progress Notes (Signed)
Fentanyl 30 ml wasted  in sink; witness by Sylvan Cheese, RN.

## 2014-03-07 NOTE — Progress Notes (Signed)
Physical Therapy Treatment Patient Details Name: Christopher [name] MRN: [id number] DOB: 20-Jan-1946 Today's Date: 03/07/2014 Time: 8115-7262 PT Time Calculation (min): 13 min  PT Assessment / Plan / Recommendation  History of Present Illness     PT Comments   Excellent progress noted today.  Pt ambulated 150 ft with RW min guard assist.  D/C plan updated from HHPT to no PT follow-up on d/c due to progress.  Follow Up Recommendations  Supervision/Assistance - 24 hour;No PT follow up     Does the patient have the potential to tolerate intense rehabilitation     Barriers to Discharge        Equipment Recommendations  None recommended by PT    Recommendations for Other Services    Frequency Min 5X/week   Progress towards PT Goals Progress towards PT goals: Progressing toward goals  Plan Discharge plan needs to be updated    Precautions / Restrictions Precautions Precautions: Back Precaution Comments: Reviewed 3/3 back precautions. Required Braces or Orthoses: Spinal Brace Spinal Brace: Lumbar corset;Applied in sitting position   Pertinent Vitals/Pain 4/10    Mobility  Bed Mobility Rolling: Modified independent (Device/Increase time) Sidelying to sit: Min assist General bed mobility comments: verbal cues for logroll Transfers Equipment used: Rolling walker (2 wheeled) Sit to Stand: Min assist Stand pivot transfers: Min assist General transfer comment: verbal cues for hand placement Ambulation/Gait Ambulation/Gait assistance: Min guard Ambulation Distance (Feet): 150 Feet Assistive device: Rolling walker (2 wheeled) Gait Pattern/deviations: Step-through pattern;Decreased stride length    Exercises     PT Diagnosis:    PT Problem List:   PT Treatment Interventions:     PT Goals (current goals can now be found in the care plan section)    Visit Information  Last PT Received On: 03/07/14 Assistance Needed: +1    Subjective Data      Cognition   Cognition Arousal/Alertness: Awake/alert Behavior During Therapy: WFL for tasks assessed/performed Overall Cognitive Status: Within Functional Limits for tasks assessed    Balance     End of Session PT - End of Session Equipment Utilized During Treatment: Gait belt;Back brace Activity Tolerance: Patient tolerated treatment well Patient left: in chair;with call bell/phone within reach Nurse Communication: Mobility status   GP     Lorriane Shire 03/07/2014, 9:41 AM  Lorrin Goodell, PT  Office # [phone] Pager [phone]

## 2014-03-07 NOTE — Progress Notes (Signed)
Paged oncall neuro-surgeon ( Dr. Rita Ohara) concerning patients increase in temp. New orders given. Patient informed. Will continue to monitor per shift.

## 2014-03-07 NOTE — Progress Notes (Signed)
Occupational Therapy Treatment Patient Details Name: Christopher [name] MRN: [id number] DOB: [birth year] Today's Date: 03/07/2014 Time: 2694-8546 OT Time Calculation (min): 26 min  OT Assessment / Plan / Recommendation  History of present illness Christopher [name] is a 68 years old gentleman with recurrent lower back pain and now admitted with s/p L4-L5 decompression and fusion.    OT comments  Pt significantly improved.  He currently requires min guard assist with LB ADLs.  Pt and wife instructed in use and acquisition of reacher and have 3in1 that can be used in the shower if needed.   He demonstrates good understanding of back precautions.   Follow Up Recommendations  No OT follow up;Supervision - Intermittent    Barriers to Discharge       Equipment Recommendations  None recommended by OT    Recommendations for Other Services    Frequency Min 2X/week   Progress towards OT Goals    Plan Discharge plan needs to be updated    Precautions / Restrictions Precautions Precautions: Back Precaution Comments: Pt and wife able to state 3/3 back precautions Required Braces or Orthoses: Spinal Brace Spinal Brace: Lumbar corset;Applied in sitting position   Pertinent Vitals/Pain     ADL  Lower Body Dressing: Min guard Where Assessed - Lower Body Dressing: Supported sit to Lobbyist: Magazine features editor Method: Sit to stand;Stand Ecologist: Comfort height toilet;Grab bars Equipment Used: Back brace Transfers/Ambulation Related to ADLs: min guard assist ADL Comments: Reviewed back precautions.  Pt able to don/doff socks by crossing ankle over knees safely. Demonstrates good technique for toilet transfer and able to access peri area for hygiene.  Reviewed safe method for grooming to avoid bending.   Instructed pt and wife on use and acquisition of reacher for home use    OT Diagnosis:    OT Problem List:   OT Treatment Interventions:     OT Goals(current  goals can now be found in the care plan section) ADL Goals Pt Will Perform Upper Body Dressing: with set-up;sitting Pt Will Perform Lower Body Dressing: with min assist;with adaptive equipment;sit to/from stand Pt Will Transfer to Toilet: with supervision;ambulating;bedside commode (comfort height toilet) Pt Will Perform Toileting - Clothing Manipulation and hygiene: with supervision;sit to/from stand Pt Will Perform Tub/Shower Transfer: Shower transfer;with supervision;3 in 1;ambulating Additional ADL Goal #1: Pt will perform bed mobility at supervision level as precursor for EOB ADLs.  Visit Information  Last OT Received On: 03/07/14 Assistance Needed: +1 History of Present Illness: Christopher [name] is a 68 years old gentleman with recurrent lower back pain and now admitted with s/p L4-L5 decompression and fusion.     Subjective Data      Prior Functioning       Cognition  Cognition Arousal/Alertness: Awake/alert Behavior During Therapy: WFL for tasks assessed/performed Overall Cognitive Status: Within Functional Limits for tasks assessed    Mobility  Bed Mobility Overal bed mobility: Needs Assistance Bed Mobility: Sidelying to Sit;Sit to Sidelying Rolling: Modified independent (Device/Increase time) Sidelying to sit: Min guard Sit to sidelying: Min guard General bed mobility comments: verbal cues for safe technique Transfers Overall transfer level: Needs assistance Equipment used: Rolling walker (2 wheeled) Sit to Stand: Min guard Stand pivot transfers: Min guard General transfer comment: verbal cues for hand placement    Exercises      Balance    End of Session OT - End of Session Equipment Utilized During Treatment: Back brace;Gait belt;Rolling walker Activity Tolerance:  Patient tolerated treatment well Patient left: in bed;with call bell/phone within reach;with family/visitor present Nurse Communication: Mobility status  [place], Ellard Artis M 03/07/2014, 12:00  PM

## 2014-03-07 NOTE — Progress Notes (Signed)
Patient ID: Christopher [name], male   DOB: 04-04-1946, 68 y.o.   MRN: [id number] Patient seems to be progressing nicely. Has appropriate postoperative back soreness. Does complain of some cramping in the legs. No numbness tingling or weakness. He has good strength in the legs. He is sitting comfortably. He is mobilizing fairly well. He is still on a PCA we will get rid of that today and see how he does on oral pain medications. Hopefully home tomorrow.

## 2014-03-08 MED FILL — Sodium Chloride IV Soln 0.9%: INTRAVENOUS | Qty: 2000 | Status: AC

## 2014-03-08 MED FILL — Heparin Sodium (Porcine) Inj 1000 Unit/ML: INTRAMUSCULAR | Qty: 30 | Status: AC

## 2014-03-08 NOTE — Progress Notes (Signed)
Physical Therapy Treatment Patient Details Name: Christopher [name] MRN: [id number] DOB: 1946-01-17 Today's Date: 03/08/2014 Time: 3007-6226 PT Time Calculation (min): 24 min  PT Assessment / Plan / Recommendation  History of Present Illness Christopher [name] is a 68 years old gentleman with recurrent lower back pain and now admitted with s/p L4-L5 decompression and fusion.    PT Comments   Patient agreeable to ambulate this afternoon. Patient RW was traded in with one that had a better fit for the patient. Patient able to recall precautions but continued twisting with ambulation. Patient reeducated on safety with use of recliner and precautions. Patient able to practice steps with 2 rails, however steps at home do not have rails but patient does have a ramp.  Follow Up Recommendations  Supervision/Assistance - 24 hour;No PT follow up     Does the patient have the potential to tolerate intense rehabilitation     Barriers to Discharge        Equipment Recommendations  None recommended by PT    Recommendations for Other Services    Frequency Min 5X/week   Progress towards PT Goals Progress towards PT goals: Progressing toward goals  Plan Current plan remains appropriate    Precautions / Restrictions Precautions Precautions: Back Precaution Comments: Patient able to recall all back precautions however requires cueing to maintain no twisting with ambulation Required Braces or Orthoses: Spinal Brace Spinal Brace: Lumbar corset;Applied in sitting position   Pertinent Vitals/Pain no apparent distress     Mobility  Bed Mobility Overal bed mobility: Needs Assistance Rolling: Modified independent (Device/Increase time) Sit to sidelying: Min guard General bed mobility comments: verbal cues for safe technique Transfers Overall transfer level: Needs assistance Equipment used: Rolling walker (2 wheeled) Sit to Stand: Min assist General transfer comment: verbal cues for hand placement. Min A from  low bed Ambulation/Gait Ambulation/Gait assistance: Min guard Ambulation Distance (Feet): 600 Feet Assistive device: Rolling walker (2 wheeled) Gait Pattern/deviations: Step-through pattern;Decreased stride length Gait velocity: WFL General Gait Details: Cues to stay withing and close to RW. Cues for upright posture and safety with RW Stairs: Yes Stairs assistance: Min guard Stair Management: Step to pattern;Forwards;Two rails Number of Stairs: 5 General stair comments: cues for safety    Exercises     PT Diagnosis:    PT Problem List:   PT Treatment Interventions:     PT Goals (current goals can now be found in the care plan section)    Visit Information  Last PT Received On: 03/08/14 Assistance Needed: +1 History of Present Illness: Christopher [name] is a 68 years old gentleman with recurrent lower back pain and now admitted with s/p L4-L5 decompression and fusion.     Subjective Data      Cognition  Cognition Arousal/Alertness: Awake/alert Behavior During Therapy: WFL for tasks assessed/performed Overall Cognitive Status: Within Functional Limits for tasks assessed    Balance     End of Session PT - End of Session Equipment Utilized During Treatment: Gait belt;Back brace Activity Tolerance: Patient tolerated treatment well Patient left: in chair;with call bell/phone within reach Nurse Communication: Mobility status   GP     Jacqualyn Posey 03/08/2014, 2:13 PM 03/08/2014 Jacqualyn Posey PTA [phone] pager [phone] office

## 2014-03-08 NOTE — Progress Notes (Signed)
Occupational Therapy Treatment Patient Details Name: Christopher [name] MRN: [id number] DOB: 25-Oct-1946 Today's Date: 03/08/2014 Time: 0623-7628 OT Time Calculation (min): 18 min  OT Assessment / Plan / Recommendation  History of present illness Christopher [name] is a 68 years old gentleman with recurrent lower back pain and now admitted with s/p L4-L5 decompression and fusion.    OT comments  Pt is able to perform BADLs with min guard assist, however, he states he plans to have his wife perform BADLs for him.  Explained need to do as much for self as possible, but anticipate he will continue to self limit  Follow Up Recommendations  No OT follow up;Supervision - Intermittent    Barriers to Discharge       Equipment Recommendations  None recommended by OT    Recommendations for Other Services    Frequency Min 2X/week   Progress towards OT Goals    Plan Discharge plan remains appropriate    Precautions / Restrictions Precautions Precautions: Back Precaution Comments: Pt and wife able to state 3/3 back precautions Required Braces or Orthoses: Spinal Brace Spinal Brace: Lumbar corset;Applied in sitting position   Pertinent Vitals/Pain     ADL  Lower Body Dressing: Min guard Where Assessed - Lower Body Dressing: Supported sit to stand Equipment Used: Back brace ADL Comments: Pt able to demonstrate good understanding of back precautions.  He requires prompting/encouragement to perform BADLs and mobility as independently as possible.  When moving sit to stand, looked at therapist x 2 stating "arent' you going to help" even though he was able to perform with min guard assist.  Reviewed safe techniques for LB ADLs and pt states "I keep telling you I ain't gonna have to do this".  Explained to pt the need to perform BADLs and mobility as independently as possible to increase/maintain ROM, reduce stiffness, and inrease strength.  He is very dependent on spouse who over assists him.     OT Diagnosis:     OT Problem List:   OT Treatment Interventions:     OT Goals(current goals can now be found in the care plan section) ADL Goals Pt Will Perform Upper Body Dressing: with set-up;sitting Pt Will Perform Lower Body Dressing: with min assist;with adaptive equipment;sit to/from stand Pt Will Transfer to Toilet: with supervision;ambulating;bedside commode (comfort height toilet) Pt Will Perform Toileting - Clothing Manipulation and hygiene: with supervision;sit to/from stand Pt Will Perform Tub/Shower Transfer: Shower transfer;with supervision;3 in 1;ambulating Additional ADL Goal #1: Pt will perform bed mobility at supervision level as precursor for EOB ADLs.  Visit Information  Last OT Received On: 03/08/14 Assistance Needed: +1 History of Present Illness: Christopher [name] is a 68 years old gentleman with recurrent lower back pain and now admitted with s/p L4-L5 decompression and fusion.     Subjective Data      Prior Functioning       Cognition  Cognition Arousal/Alertness: Awake/alert Behavior During Therapy: WFL for tasks assessed/performed Overall Cognitive Status: Within Functional Limits for tasks assessed    Mobility  Bed Mobility Overal bed mobility: Needs Assistance Rolling: Modified independent (Device/Increase time) Sit to sidelying: Min guard General bed mobility comments: Pt waited for therapist to help him lift his legs although he was able to perform with only min guard assist Transfers Overall transfer level: Needs assistance Equipment used: Rolling walker (2 wheeled) Sit to Stand: Min guard Stand pivot transfers: Min guard General transfer comment: sit to stand from recliner    Exercises  Balance    End of Session OT - End of Session Equipment Utilized During Treatment: Back brace;Gait belt;Rolling walker Activity Tolerance: Patient tolerated treatment well Patient left: in bed;with call bell/phone within reach;with family/visitor present Nurse  Communication: Mobility status  Christopher [name] 03/08/2014, 3:41 PM

## 2014-03-08 NOTE — Progress Notes (Addendum)
Temp 101.5 after tylenol. Resting.  C/O hip and knee pain when moving at times, otherwise comfortable.  Pt has ambulated with staff and PT 4 times today.  No call back from MD.  Will page again.

## 2014-03-08 NOTE — Anesthesia Postprocedure Evaluation (Signed)
  Anesthesia Post-op Note  Patient: Christopher [name]  Procedure(s) Performed: Procedure(s): LUMBAR FOUR-FIVE POSTERIOR LUMBAR INTERBODY FUSION (N/A)  No reported anesthetic complications

## 2014-03-08 NOTE — Progress Notes (Signed)
Occupational Therapy Treatment Patient Details Name: OWAIN ECKERMAN MRN: [id number] DOB: [birth year] Today's Date: 03/08/2014 Time: 0109-3235 OT Time Calculation (min): 15 min  OT Assessment / Plan / Recommendation  History of present illness Mr. Kops is a 68 years old gentleman with recurrent lower back pain and now admitted with s/p L4-L5 decompression and fusion.    OT comments  Pt just back to bed with pain 8/10.  Reviewed back precautions with pt and wife, and discussed appropriate activity for home, appropriate sitting surfaces, etc.  Will attempt to see again later today.   Follow Up Recommendations  No OT follow up;Supervision - Intermittent    Barriers to Discharge       Equipment Recommendations  None recommended by OT    Recommendations for Other Services    Frequency Min 2X/week   Progress towards OT Goals    Plan Discharge plan remains appropriate    Precautions / Restrictions Precautions Precautions: Back Precaution Comments: Pt and wife able to state 3/3 back precautions Required Braces or Orthoses: Spinal Brace Spinal Brace: Lumbar corset;Applied in sitting position   Pertinent Vitals/Pain     ADL  ADL Comments: Pt just back to bed with increased pain.  Wife reports she assisted him with am ADLs and ambulated with him on unit.   They are both able to state back precautions independently.   Discussed avoiding sitting in a recliner with the feet up and back reclined due to increased pressure on lumbar area.  Problem solved through appropriate chairs at home, and encouraged him to change positions every hour.     OT Diagnosis:    OT Problem List:   OT Treatment Interventions:     OT Goals(current goals can now be found in the care plan section) ADL Goals Pt Will Perform Upper Body Dressing: with set-up;sitting Pt Will Perform Lower Body Dressing: with min assist;with adaptive equipment;sit to/from stand Pt Will Transfer to Toilet: with  supervision;ambulating;bedside commode (comfort height toilet) Pt Will Perform Toileting - Clothing Manipulation and hygiene: with supervision;sit to/from stand Pt Will Perform Tub/Shower Transfer: Shower transfer;with supervision;3 in 1;ambulating Additional ADL Goal #1: Pt will perform bed mobility at supervision level as precursor for EOB ADLs.  Visit Information  Last OT Received On: 03/08/14 Assistance Needed: +1 History of Present Illness: Mr. Omlor is a 68 years old gentleman with recurrent lower back pain and now admitted with s/p L4-L5 decompression and fusion.     Subjective Data      Prior Functioning       Cognition  Cognition Arousal/Alertness: Awake/alert Behavior During Therapy: WFL for tasks assessed/performed Overall Cognitive Status: Within Functional Limits for tasks assessed    Mobility       Exercises      Balance    End of Session OT - End of Session Equipment Utilized During Treatment: Back brace;Gait belt;Rolling walker Activity Tolerance: Patient tolerated treatment well Patient left: in bed;with call bell/phone within reach;with family/visitor present Nurse Communication: Patient requests pain meds  [place], Ellard Artis M 03/08/2014, 11:15 AM

## 2014-03-08 NOTE — Progress Notes (Signed)
Temp 102. Pt c/o discomfort in neck and legs cramping.  He is using IS as ordered. 650 mg tylenol given. Awaiting MD call back.

## 2014-03-09 LAB — CBC
HCT: 32.6 % — ABNORMAL LOW (ref 39.0–52.0)
Hemoglobin: 11.3 g/dL — ABNORMAL LOW (ref 13.0–17.0)
MCH: 31.4 pg (ref 26.0–34.0)
MCHC: 34.7 g/dL (ref 30.0–36.0)
MCV: 90.6 fL (ref 78.0–100.0)
Platelets: 195 10*3/uL (ref 150–400)
RBC: 3.6 MIL/uL — ABNORMAL LOW (ref 4.22–5.81)
RDW: 12.4 % (ref 11.5–15.5)
WBC: 8.8 10*3/uL (ref 4.0–10.5)

## 2014-03-09 LAB — URINE CULTURE
Colony Count: NO GROWTH
Culture: NO GROWTH

## 2014-03-09 MED ORDER — FLEET ENEMA 7-19 GM/118ML RE ENEM
1.0000 | ENEMA | Freq: Every day | RECTAL | Status: DC | PRN
  Administered 2014-03-09: 1 via RECTAL
  Filled 2014-03-09: qty 1

## 2014-03-09 NOTE — Progress Notes (Signed)
PT Cancellation Note  Patient Details Name: Christopher [name] MRN: [id number] DOB: 1946/09/06   Cancelled Treatment:    Reason Eval/Treat Not Completed: Fatigue/lethargy limiting ability to participate. Patient had just returned to bed and requested to hold PT at this time. Patient was seen up walking with wife earlier this morning. Will follow up this afternoon as time allows   Jacqualyn Posey 03/09/2014, 10:27 AM

## 2014-03-09 NOTE — Progress Notes (Signed)
Pt temp was 100.7 this am during shift change. Pt OOB to ambulate in hallways with spouse at side, pt adm prn tylenol and incentive spirometer done. Pt temp down to 97.9. Pt said he feels temp coming down. Will continue monitor and pt sleeping comfortably in bed. Francis Gaines Natayah Warmack RN.

## 2014-03-09 NOTE — Progress Notes (Signed)
MD on-call (Dr. Ellene Route) called back and ordered for CBC in the morning. Will continue to monitor pt closely.

## 2014-03-09 NOTE — Progress Notes (Signed)
Forgot to type a progress note yesterday. C/o muscle spasms, no weakness. Wound flat. Constipation. Plan, enema plus shower . Discharge in am

## 2014-03-10 ENCOUNTER — Inpatient Hospital Stay (HOSPITAL_COMMUNITY): Payer: Medicare Other

## 2014-03-10 LAB — CBC WITH DIFFERENTIAL/PLATELET
Basophils Absolute: 0 10*3/uL (ref 0.0–0.1)
Basophils Relative: 0 % (ref 0–1)
Eosinophils Absolute: 0.1 10*3/uL (ref 0.0–0.7)
Eosinophils Relative: 1 % (ref 0–5)
HCT: 34.3 % — ABNORMAL LOW (ref 39.0–52.0)
Hemoglobin: 11.5 g/dL — ABNORMAL LOW (ref 13.0–17.0)
Lymphocytes Relative: 25 % (ref 12–46)
Lymphs Abs: 1.7 10*3/uL (ref 0.7–4.0)
MCH: 30.7 pg (ref 26.0–34.0)
MCHC: 33.5 g/dL (ref 30.0–36.0)
MCV: 91.5 fL (ref 78.0–100.0)
Monocytes Absolute: 0.9 10*3/uL (ref 0.1–1.0)
Monocytes Relative: 13 % — ABNORMAL HIGH (ref 3–12)
Neutro Abs: 4.2 10*3/uL (ref 1.7–7.7)
Neutrophils Relative %: 61 % (ref 43–77)
Platelets: 246 10*3/uL (ref 150–400)
RBC: 3.75 MIL/uL — ABNORMAL LOW (ref 4.22–5.81)
RDW: 12.3 % (ref 11.5–15.5)
WBC: 6.9 10*3/uL (ref 4.0–10.5)

## 2014-03-10 NOTE — Progress Notes (Signed)
Pt noted to have difficulty voiding. Pt attempted x1 to void but unsuccessful, pt wife said pt has this problem but it seems to be getting worse. Pt wife also said pt had prostate problems in the past received antibiotics to treat that issue. Still awaiting on UA from pt. Will continue to monitor. P. Amo Shernita Rabinovich.

## 2014-03-10 NOTE — Progress Notes (Signed)
Patient ID: Christopher [name], male   DOB: Feb 13, 1946, 68 y.o.   MRN: [id number] Still low grade temperature. Wound dry. No dysuria. Getting IS. See orders

## 2014-03-10 NOTE — Progress Notes (Signed)
Occupational Therapy Treatment Patient Details Name: Christopher [name] MRN: [id number] DOB: 07-29-1946 Today's Date: 03/10/2014    History of present illness Mr. Meulemans is a 69 years old gentleman with recurrent lower back pain and now admitted with s/p L4-L5 decompression and fusion.    OT comments  Pt make good progress towards acute OT goals. Ambulated to/from 4th floor rehab gym supervision level. Practiced side-stepping into shower using wall to stabilize, min guard. Cued to not use bedrails during sidelying>EOB to simulate bed mobility at home. Educated on completing BADLs as independently as possible for therapeutic benefit. Reviewed techniques for safe completion of BADLs at home with back precautions.   Follow Up Recommendations  No OT follow up;Supervision - Intermittent    Equipment Recommendations  None recommended by OT    Recommendations for Other Services      Precautions / Restrictions Precautions Precautions: Back Precaution Comments: Patient able to recall all precautions Required Braces or Orthoses: Spinal Brace Spinal Brace: Lumbar corset;Applied in sitting position Restrictions Weight Bearing Restrictions: No       Mobility Bed Mobility Overal bed mobility: Needs Assistance Bed Mobility: Sidelying to Sit   Sidelying to sit: Min guard (cued to not use bedrails to simulate bed mobility at home)       General bed mobility comments: cued to not use bedrails to simulate bed mobility at home  Transfers Overall transfer level: Needs assistance Equipment used: Rolling walker (2 wheeled) Transfers: Sit to/from Stand Sit to Stand: Min guard Stand pivot transfers: Min guard       General transfer comment: pt needed extra time and struggled a little EOB >standing. Pt stated that he does not experience that kind of difficulty with other transfers from a higher surface.    Balance                                   ADL                 Tub/  Shower Transfer: Min guard;Adhering to back precautions;Cueing for safety Functional mobility during ADLs: Supervision/safety General ADL Comments: Reviewed techniques for safe completion of ADLs with back precautions including setup of home environment, use of AE, and reaching for items using same side hand. Educated on completion of BADLs as independently as possible for therapeutic benefit.      Vision                     Perception     Praxis      Cognition   Behavior During Therapy: [facility] for tasks assessed/performed Overall Cognitive Status: Within Functional Limits for tasks assessed                       Extremity/Trunk Assessment               Exercises       General Comments      Pertinent Vitals/ Pain       No c/o pain or sign of acute distress.  Home Living                                          Prior Functioning/Environment              Frequency Min 2X/week  Progress Toward Goals  OT Goals(current goals can now be found in the care plan section)  Progress towards OT goals: Progressing toward goals  Acute Rehab OT Goals Patient Stated Goal: to go home OT Goal Formulation: With patient/family Time For Goal Achievement: 03/13/14 Potential to Achieve Goals: Good ADL Goals Pt Will Perform Upper Body Dressing: with set-up;sitting Pt Will Perform Lower Body Dressing: with min assist;with adaptive equipment;sit to/from stand Pt Will Transfer to Toilet: with supervision;ambulating;bedside commode (comfort height toilet) Pt Will Perform Toileting - Clothing Manipulation and hygiene: with supervision;sit to/from stand Pt Will Perform Tub/Shower Transfer: Shower transfer;with supervision;3 in 1;ambulating Additional ADL Goal #1: Pt will perform bed mobility at supervision level as precursor for EOB ADLs.  Plan Discharge plan remains appropriate    End of Session Equipment Utilized During Treatment: Back  brace;Gait belt;Rolling walker  Activity Tolerance Patient tolerated treatment well   Patient Left with family/visitor present;Other (comment) (ambulating to toilet, wife in room )   Nurse Communication          Time: [phone] OT Time Calculation (min): 24 min  Charges: OT General Charges $OT Visit: 1 Procedure OT Treatments $Self Care/Home Management : 8-22 mins $Therapeutic Activity: 8-22 mins  Tyrone Schimke OTR/L Pager: [phone]  03/10/2014, 11:42 AM

## 2014-03-10 NOTE — Progress Notes (Signed)
Physical Therapy Treatment Patient Details Name: Christopher [name] MRN: [id number] DOB: 10-15-1946 Today's Date: 03/10/2014    History of Present Illness [name] is a 68 years old gentleman with recurrent lower back pain and now admitted with s/p L4-L5 decompression and fusion.     PT Comments    Patient progressing well and ambulating without difficulting. Patient encouraged to walk with wife. Planning to DC today.   Follow Up Recommendations  Supervision/Assistance - 24 hour;No PT follow up     Equipment Recommendations  None recommended by PT    Recommendations for Other Services       Precautions / Restrictions Precautions Precautions: Back Precaution Comments: Patient able to recall all precautions Required Braces or Orthoses: Spinal Brace Spinal Brace: Lumbar corset;Applied in sitting position    Mobility  Bed Mobility               General bed mobility comments: Sitting on EOB upon arrival  Transfers Overall transfer level: Needs assistance Equipment used: Rolling walker (2 wheeled)   Sit to Stand: Min guard         General transfer comment: Patient with good technique. Patient with a "catch" in mid stand  Ambulation/Gait Ambulation/Gait assistance: Supervision Ambulation Distance (Feet): 1200 Feet Assistive device: Rolling walker (2 wheeled) Gait Pattern/deviations: Step-through pattern Gait velocity: WFL       Stairs   Stairs assistance: Supervision Stair Management: Step to pattern;Forwards;One rail Left Number of Stairs: 6    Wheelchair Mobility    Modified Rankin (Stroke Patients Only)       Balance                                    Cognition Arousal/Alertness: Awake/alert Behavior During Therapy: WFL for tasks assessed/performed Overall Cognitive Status: Within Functional Limits for tasks assessed                      Exercises      General Comments        Pertinent Vitals/Pain Complained of  cramping in legs. patient repositioned for comfort     Home Living                      Prior Function            PT Goals (current goals can now be found in the care plan section) Progress towards PT goals: Progressing toward goals    Frequency  Min 5X/week    PT Plan Current plan remains appropriate    End of Session Equipment Utilized During Treatment: Gait belt;Back brace Activity Tolerance: Patient tolerated treatment well Patient left: in chair;with call bell/phone within reach     Time: 0755-0818 PT Time Calculation (min): 23 min  Charges:  $Gait Training: 8-22 mins $Therapeutic Activity: 8-22 mins                    G Codes:      Jacqualyn Posey 03/10/2014, 8:27 AM 03/10/2014 Jacqualyn Posey PTA [phone] pager [phone] office

## 2014-03-11 NOTE — Progress Notes (Signed)
Patient ID: Christopher [name], male   DOB: 1946-02-23, 68 y.o.   MRN: [id number] All the fever w/u is negative. Wants to go home

## 2014-03-11 NOTE — Discharge Summary (Signed)
Physician Discharge Summary  Patient ID: [name] MRN: [id number] DOB/AGE: 69/09/1946 69 y.o.  Admit date: 03/05/2014 Discharge date: 03/11/2014  Admission Diagnoses:lumbar spondylolisthesis  Discharge Diagnoses:  Active Problems:   Spondylolisthesis of lumbar region   Discharged Condition: ambulating  Hospital Course: surgery  Consults: none  Significant Diagnostic Studies: myelogram  Treatments: lumbar fusion  Discharge Exam: Blood pressure 121/77, pulse 81, temperature 98.6 F (37 C), temperature source Oral, resp. rate 20, height 5\' 8"  (1.727 m), weight 69.899 kg (154 lb 1.6 oz), SpO2 95.00%. Wound flat, no neck stiffness, no weakness. Fever w/u negative   Disposition: home. To see me in 3 weeks or before prn. Spoke with wife     Medication List    ASK your doctor about these medications       ALPRAZolam 1 MG tablet  Commonly known as:  XANAX  Take 1 mg by mouth at bedtime as needed for sleep.     ciprofloxacin 500 MG tablet  Commonly known as:  CIPRO  Take 500 mg by mouth 2 (two) times daily.     docusate sodium 100 MG capsule  Commonly known as:  COLACE  Take 200 mg by mouth daily.     esomeprazole 40 MG capsule  Commonly known as:  NEXIUM  Take 40 mg by mouth daily.     Fish Oil 1200 MG Caps  Take 1,200 mg by mouth daily.     GLUCOSAMINE CHONDROITIN VIT D3 PO  Take 1 tablet by mouth daily.     HYDROcodone-acetaminophen 10-325 MG per tablet  Commonly known as:  NORCO  Take 1 tablet by mouth every 6 (six) hours as needed for moderate pain.     naproxen sodium 220 MG tablet  Commonly known as:  ANAPROX  Take 440 mg by mouth daily as needed (back pain).     pregabalin 75 MG capsule  Commonly known as:  LYRICA  Take 75 mg by mouth 2 (two) times daily.         Signed: Floyce Stakes 03/11/2014, 9:21 AM

## 2014-03-11 NOTE — Progress Notes (Addendum)
Pt A&O x4, discharge education completed with pt and spouse. Both voices understanding and denies any questions. Pt IV removed, pt transported off unit via wheelchair with belongings and spouse at side. Francis Gaines Ivadell Gaul RN.

## 2014-03-11 NOTE — Progress Notes (Signed)
Physical Therapy Treatment Patient Details Name: COMPTON BRIGANCE MRN: [id number] DOB: 1946-06-25 Today's Date: 03/11/2014    History of Present Illness Mr. Defalco is a 68 years old gentleman with recurrent lower back pain and now admitted with s/p L4-L5 decompression and fusion.     PT Comments    Patient was walking in hallway with wife when I approached. Patient is planning to DC today and wanted to practice steps once more. Patient progressing well and very eager to DC home today  Follow Up Recommendations  Supervision/Assistance - 24 hour;No PT follow up     Equipment Recommendations  None recommended by PT    Recommendations for Other Services       Precautions / Restrictions Precautions Precautions: Back Precaution Comments: Patient able to recall all precautions Required Braces or Orthoses: Spinal Brace Spinal Brace: Lumbar corset;Applied in sitting position Restrictions Weight Bearing Restrictions: No    Mobility  Bed Mobility                  Transfers       Sit to Stand: Supervision         General transfer comment: Supervision for safety  Ambulation/Gait Ambulation/Gait assistance: Supervision Ambulation Distance (Feet): 600 Feet Assistive device: Rolling walker (2 wheeled) Gait Pattern/deviations: Step-through pattern;Decreased stride length Gait velocity: WFL   General Gait Details: Cues to stay withing and close to RW. Cues for upright posture and safety with RW   Stairs   Stairs assistance: Supervision Stair Management: One rail Right;Step to pattern;Forwards Number of Stairs: 6 General stair comments: cues for safety  Wheelchair Mobility    Modified Rankin (Stroke Patients Only)       Balance                                    Cognition Arousal/Alertness: Awake/alert Behavior During Therapy: WFL for tasks assessed/performed Overall Cognitive Status: Within Functional Limits for tasks assessed                       Exercises      General Comments        Pertinent Vitals/Pain no apparent distress     Home Living                      Prior Function            PT Goals (current goals can now be found in the care plan section) Progress towards PT goals: Progressing toward goals    Frequency  Min 5X/week    PT Plan Current plan remains appropriate    End of Session Equipment Utilized During Treatment: Gait belt;Back brace Activity Tolerance: Patient tolerated treatment well Patient left: in chair;with call bell/phone within reach     Time: 0959-1015 PT Time Calculation (min): 16 min  Charges:  $Gait Training: 8-22 mins                    G Codes:      Jacqualyn Posey 03/11/2014, 11:56 AM  03/11/2014 Jacqualyn Posey PTA [phone] pager [phone] office

## 2014-03-14 LAB — URINE CULTURE: Colony Count: 15000

## 2014-05-17 ENCOUNTER — Encounter (HOSPITAL_COMMUNITY): Payer: Self-pay | Admitting: Pharmacy Technician

## 2014-05-17 NOTE — Patient Instructions (Signed)
Christopher [name]  05/17/2014   Your procedure is scheduled on:  05/24/14  Report to [facility] at 8:00 AM.  Call this number if you have problems the morning of surgery: [phone]   Remember:   Do not eat food or drink liquids after midnight.   Take these medicines the morning of surgery with A SIP OF WATER: Xanax, Nexium, Norco, Lyrica   Do not wear jewelry, make-up or nail polish.  Do not wear lotions, powders, or perfumes. You may wear deodorant.  Do not shave 48 hours prior to surgery. Men may shave face and neck.  Do not bring valuables to the hospital.  [facility] is not responsible for any belongings or valuables.               Contacts, dentures or bridgework may not be worn into surgery.  Leave suitcase in the car. After surgery it may be brought to your room.  For patients admitted to the hospital, discharge time is determined by your treatment team.               Patients discharged the day of surgery will not be allowed to drive home.  Name and phone number of your driver:   Special Instructions: N/A   Please read over the following fact sheets that you were given: Anesthesia Post-op Instructions   PATIENT INSTRUCTIONS POST-ANESTHESIA  IMMEDIATELY FOLLOWING SURGERY:  Do not drive or operate machinery for the first twenty four hours after surgery.  Do not make any important decisions for twenty four hours after surgery or while taking narcotic pain medications or sedatives.  If you develop intractable nausea and vomiting or a severe headache please notify your doctor immediately.  FOLLOW-UP:  Please make an appointment with your surgeon as instructed. You do not need to follow up with anesthesia unless specifically instructed to do so.  WOUND CARE INSTRUCTIONS (if applicable):  Keep a dry clean dressing on the anesthesia/puncture wound site if there is drainage.  Once the wound has quit draining you may leave it open to air.  Generally you should leave the bandage intact  for twenty four hours unless there is drainage.  If the epidural site drains for more than 36-48 hours please call the anesthesia department.  QUESTIONS?:  Please feel free to call your physician or the hospital operator if you have any questions, and they will be happy to assist you.      Cataract A cataract is a clouding of the lens of the eye. When a lens becomes cloudy, vision is reduced based on the degree and nature of the clouding. Many cataracts reduce vision to some degree. Some cataracts make people more near-sighted as they develop. Other cataracts increase glare. Cataracts that are ignored and become worse can sometimes look white. The white color can be seen through the pupil. CAUSES   Aging. However, cataracts may occur at any age, even in newborns.  Certain drugs.  Trauma to the eye.  Certain diseases such as diabetes.  Specific eye diseases such as chronic inflammation inside the eye or a sudden attack of a rare form of glaucoma.  Inherited or acquired medical problems. SYMPTOMS   Gradual, progressive drop in vision in the affected eye.  Severe, rapid visual loss. This most often happens when trauma is the cause. DIAGNOSIS  To detect a cataract, an eye doctor examines the lens. Cataracts are best diagnosed with an exam of the eyes with the pupils enlarged (dilated) by  drops.  TREATMENT  For an early cataract, vision may improve by using different eyeglasses or stronger lighting. If that does not help your vision, surgery is the only effective treatment. A cataract needs to be surgically removed when vision loss interferes with your everyday activities, such as driving, reading, or watching TV. A cataract may also have to be removed if it prevents examination or treatment of another eye problem. Surgery removes the cloudy lens and usually replaces it with a substitute lens (intraocular lens, IOL).  At a time when both you and your doctor agree, the cataract will be  surgically removed. If you have cataracts in both eyes, only one is usually removed at a time. This allows the operated eye to heal and be out of danger from any possible problems after surgery (such as infection or poor wound healing). In rare cases, a cataract may be doing damage to your eye. In these cases, your caregiver may advise surgical removal right away. The vast majority of people who have cataract surgery have better vision afterward. HOME CARE INSTRUCTIONS  If you are not planning surgery, you may be asked to do the following:  Use different eyeglasses.  Use stronger or brighter lighting.  Ask your eye doctor about reducing your medicine dose or changing medicines if it is thought that a medicine caused your cataract. Changing medicines does not make the cataract go away on its own.  Become familiar with your surroundings. Poor vision can lead to injury. Avoid bumping into things on the affected side. You are at a higher risk for tripping or falling.  Exercise extreme care when driving or operating machinery.  Wear sunglasses if you are sensitive to bright light or experiencing problems with glare. SEEK IMMEDIATE MEDICAL CARE IF:   You have a worsening or sudden vision loss.  You notice redness, swelling, or increasing pain in the eye.  You have a fever. Document Released: 12/03/2005 Document Revised: 02/25/2012 Document Reviewed: 07/27/2011 [facility] Patient Information 2014 [place], [place].

## 2014-05-18 ENCOUNTER — Encounter (HOSPITAL_COMMUNITY): Payer: Self-pay

## 2014-05-18 ENCOUNTER — Other Ambulatory Visit: Payer: Self-pay

## 2014-05-18 ENCOUNTER — Encounter (HOSPITAL_COMMUNITY)
Admission: RE | Admit: 2014-05-18 | Discharge: 2014-05-18 | Disposition: A | Discharge status: Home / Self Care | Payer: Medicare Other | Source: Ambulatory Visit | Attending: Ophthalmology | Admitting: Ophthalmology

## 2014-05-18 DIAGNOSIS — Z01812 Encounter for preprocedural laboratory examination: Secondary | ICD-10-CM | POA: Insufficient documentation

## 2014-05-18 HISTORY — DX: Nausea with vomiting, unspecified: R11.2

## 2014-05-18 HISTORY — DX: Other specified postprocedural states: Z98.890

## 2014-05-18 LAB — BASIC METABOLIC PANEL
BUN: 13 mg/dL (ref 6–23)
CO2: 29 mEq/L (ref 19–32)
Calcium: 9.4 mg/dL (ref 8.4–10.5)
Chloride: 102 mEq/L (ref 96–112)
Creatinine, Ser: 0.77 mg/dL (ref 0.50–1.35)
GFR calc Af Amer: >90 mL/min (ref >90)
GFR calc non Af Amer: >90 mL/min (ref >90)
Glucose, Bld: 117 mg/dL — ABNORMAL HIGH (ref 70–99)
Potassium: 4.4 mEq/L (ref 3.7–5.3)
Sodium: 141 mEq/L (ref 137–147)

## 2014-05-18 LAB — HEMOGLOBIN AND HEMATOCRIT, BLOOD
HCT: 40 % (ref 39.0–52.0)
Hemoglobin: 13.2 g/dL (ref 13.0–17.0)

## 2014-05-21 MED ORDER — NEOMYCIN-POLYMYXIN-DEXAMETH 3.5-10000-0.1 OP SUSP
OPHTHALMIC | Status: AC
  Filled 2014-05-21: qty 5

## 2014-05-21 MED ORDER — PHENYLEPHRINE HCL 2.5 % OP SOLN
OPHTHALMIC | Status: AC
  Filled 2014-05-21: qty 15

## 2014-05-21 MED ORDER — CYCLOPENTOLATE-PHENYLEPHRINE OP SOLN OPTIME - NO CHARGE
OPHTHALMIC | Status: AC
  Filled 2014-05-21: qty 2

## 2014-05-21 MED ORDER — LIDOCAINE HCL 3.5 % OP GEL
OPHTHALMIC | Status: AC
  Filled 2014-05-21: qty 1

## 2014-05-21 MED ORDER — TETRACAINE HCL 0.5 % OP SOLN
OPHTHALMIC | Status: AC
  Filled 2014-05-21: qty 2

## 2014-05-21 MED ORDER — LIDOCAINE HCL (PF) 1 % IJ SOLN
INTRAMUSCULAR | Status: AC
  Filled 2014-05-21: qty 2

## 2014-05-24 ENCOUNTER — Encounter (HOSPITAL_COMMUNITY)
Admission: RE | Disposition: A | Discharge status: Home / Self Care | Payer: Self-pay | Source: Ambulatory Visit | Attending: Ophthalmology

## 2014-05-24 ENCOUNTER — Ambulatory Visit (HOSPITAL_COMMUNITY)
Admission: RE | Admit: 2014-05-24 | Discharge: 2014-05-24 | Disposition: A | Discharge status: Home / Self Care | Payer: Medicare Other | Source: Ambulatory Visit | Attending: Ophthalmology | Admitting: Ophthalmology

## 2014-05-24 ENCOUNTER — Ambulatory Visit (HOSPITAL_COMMUNITY): Payer: Medicare Other | Admitting: Anesthesiology

## 2014-05-24 ENCOUNTER — Encounter (HOSPITAL_COMMUNITY): Payer: Self-pay | Admitting: *Deleted

## 2014-05-24 ENCOUNTER — Encounter (HOSPITAL_COMMUNITY): Payer: Medicare Other | Admitting: Anesthesiology

## 2014-05-24 DIAGNOSIS — H2589 Other age-related cataract: Secondary | ICD-10-CM | POA: Insufficient documentation

## 2014-05-24 HISTORY — PX: CATARACT EXTRACTION W/PHACO: SHX586

## 2014-05-24 SURGERY — PHACOEMULSIFICATION, CATARACT, WITH IOL INSERTION
Anesthesia: Monitor Anesthesia Care | Site: Eye | Laterality: Right

## 2014-05-24 MED ORDER — POVIDONE-IODINE 5 % OP SOLN
OPHTHALMIC | Status: DC | PRN
  Administered 2014-05-24: 1 via OPHTHALMIC

## 2014-05-24 MED ORDER — TETRACAINE HCL 0.5 % OP SOLN
1.0000 [drp] | OPHTHALMIC | Status: AC
  Administered 2014-05-24 (×3): 1 [drp] via OPHTHALMIC

## 2014-05-24 MED ORDER — CYCLOPENTOLATE-PHENYLEPHRINE 0.2-1 % OP SOLN
1.0000 [drp] | OPHTHALMIC | Status: AC
  Administered 2014-05-24 (×3): 1 [drp] via OPHTHALMIC

## 2014-05-24 MED ORDER — MIDAZOLAM HCL 2 MG/2ML IJ SOLN
1.0000 mg | INTRAMUSCULAR | Status: DC | PRN
  Administered 2014-05-24 (×2): 2 mg via INTRAVENOUS
  Filled 2014-05-24 (×2): qty 2

## 2014-05-24 MED ORDER — LIDOCAINE 3.5 % OP GEL OPTIME - NO CHARGE
OPHTHALMIC | Status: DC | PRN
  Administered 2014-05-24: 2 [drp] via OPHTHALMIC

## 2014-05-24 MED ORDER — PROVISC 10 MG/ML IO SOLN
INTRAOCULAR | Status: DC | PRN
  Administered 2014-05-24: 0.85 mL via INTRAOCULAR

## 2014-05-24 MED ORDER — BSS IO SOLN
INTRAOCULAR | Status: DC | PRN
  Administered 2014-05-24: 15 mL via INTRAOCULAR

## 2014-05-24 MED ORDER — LACTATED RINGERS IV SOLN
INTRAVENOUS | Status: DC
  Administered 2014-05-24: 08:00:00 via INTRAVENOUS

## 2014-05-24 MED ORDER — LIDOCAINE HCL (PF) 1 % IJ SOLN
INTRAMUSCULAR | Status: DC | PRN
  Administered 2014-05-24: .6 mL

## 2014-05-24 MED ORDER — NEOMYCIN-POLYMYXIN-DEXAMETH 3.5-10000-0.1 OP SUSP
OPHTHALMIC | Status: DC | PRN
  Administered 2014-05-24: 2 [drp] via OPHTHALMIC

## 2014-05-24 MED ORDER — FENTANYL CITRATE 0.05 MG/ML IJ SOLN
25.0000 ug | INTRAMUSCULAR | Status: AC
  Administered 2014-05-24 (×2): 25 ug via INTRAVENOUS
  Filled 2014-05-24: qty 2

## 2014-05-24 MED ORDER — EPINEPHRINE HCL 1 MG/ML IJ SOLN
INTRAOCULAR | Status: DC | PRN
  Administered 2014-05-24: 09:00:00

## 2014-05-24 MED ORDER — PHENYLEPHRINE HCL 2.5 % OP SOLN
1.0000 [drp] | OPHTHALMIC | Status: AC
  Administered 2014-05-24 (×3): 1 [drp] via OPHTHALMIC

## 2014-05-24 MED ORDER — ONDANSETRON HCL 4 MG/2ML IJ SOLN
4.0000 mg | Freq: Once | INTRAMUSCULAR | Status: AC
  Administered 2014-05-24: 4 mg via INTRAVENOUS
  Filled 2014-05-24: qty 2

## 2014-05-24 MED ORDER — EPINEPHRINE HCL 1 MG/ML IJ SOLN
INTRAMUSCULAR | Status: AC
  Filled 2014-05-24: qty 1

## 2014-05-24 MED ORDER — FENTANYL CITRATE 0.05 MG/ML IJ SOLN: 25.0000 ug | INTRAMUSCULAR | Status: DC | PRN

## 2014-05-24 MED ORDER — LIDOCAINE HCL 3.5 % OP GEL
1.0000 "application " | Freq: Once | OPHTHALMIC | Status: AC
  Administered 2014-05-24: 1 via OPHTHALMIC

## 2014-05-24 MED ORDER — ONDANSETRON HCL 4 MG/2ML IJ SOLN: 4.0000 mg | Freq: Once | INTRAMUSCULAR | Status: DC | PRN

## 2014-05-24 SURGICAL SUPPLY — 33 items
CAPSULAR TENSION RING-AMO (OPHTHALMIC RELATED) IMPLANT
CLOTH BEACON ORANGE TIMEOUT ST (SAFETY) ×2 IMPLANT
EYE SHIELD UNIVERSAL CLEAR (GAUZE/BANDAGES/DRESSINGS) ×2 IMPLANT
GLOVE BIO SURGEON STRL SZ 6.5 (GLOVE) IMPLANT
GLOVE BIOGEL PI IND STRL 6.5 (GLOVE) IMPLANT
GLOVE BIOGEL PI IND STRL 7.0 (GLOVE) IMPLANT
GLOVE BIOGEL PI IND STRL 7.5 (GLOVE) IMPLANT
GLOVE BIOGEL PI INDICATOR 6.5 (GLOVE)
GLOVE BIOGEL PI INDICATOR 7.0 (GLOVE)
GLOVE BIOGEL PI INDICATOR 7.5 (GLOVE)
GLOVE ECLIPSE 6.5 STRL STRAW (GLOVE) IMPLANT
GLOVE ECLIPSE 7.0 STRL STRAW (GLOVE) IMPLANT
GLOVE ECLIPSE 7.5 STRL STRAW (GLOVE) IMPLANT
GLOVE EXAM NITRILE LRG STRL (GLOVE) IMPLANT
GLOVE EXAM NITRILE MD LF STRL (GLOVE) IMPLANT
GLOVE SKINSENSE NS SZ6.5 (GLOVE)
GLOVE SKINSENSE NS SZ7.0 (GLOVE)
GLOVE SKINSENSE STRL SZ6.5 (GLOVE) IMPLANT
GLOVE SKINSENSE STRL SZ7.0 (GLOVE) IMPLANT
GLOVE SS N UNI LF 7.0 STRL (GLOVE) ×4 IMPLANT
KIT VITRECTOMY (OPHTHALMIC RELATED) IMPLANT
PAD ARMBOARD 7.5X6 YLW CONV (MISCELLANEOUS) ×2 IMPLANT
PROC W NO LENS (INTRAOCULAR LENS)
PROC W SPEC LENS (INTRAOCULAR LENS)
PROCESS W NO LENS (INTRAOCULAR LENS) IMPLANT
PROCESS W SPEC LENS (INTRAOCULAR LENS) IMPLANT
RING MALYGIN (MISCELLANEOUS) IMPLANT
SIGHTPATH CAT PROC W REG LENS (Ophthalmic Related) ×2 IMPLANT
SYR TB 1ML LL NO SAFETY (SYRINGE) ×2 IMPLANT
TAPE SURG TRANSPORE 1 IN (GAUZE/BANDAGES/DRESSINGS) ×1 IMPLANT
TAPE SURGICAL TRANSPORE 1 IN (GAUZE/BANDAGES/DRESSINGS) ×1
VISCOELASTIC ADDITIONAL (OPHTHALMIC RELATED) IMPLANT
WATER STERILE IRR 250ML POUR (IV SOLUTION) ×2 IMPLANT

## 2014-05-24 NOTE — Transfer of Care (Signed)
Immediate Anesthesia Transfer of Care Note  Patient: Christopher [name]  Procedure(s) Performed: Procedure(s) with comments: CATARACT EXTRACTION PHACO AND INTRAOCULAR LENS PLACEMENT (IOC) (Right) - CDE 6.74  Patient Location: Short Stay  Anesthesia Type:MAC  Level of Consciousness: awake, alert , oriented and patient cooperative  Airway & Oxygen Therapy: Patient Spontanous Breathing  Post-op Assessment: Report given to PACU RN, Post -op Vital signs reviewed and stable and Patient moving all extremities  Post vital signs: Reviewed and stable  Complications: No apparent anesthesia complications

## 2014-05-24 NOTE — Op Note (Signed)
Date of Admission: 05/24/2014  Date of Surgery: 05/24/2014   Pre-Op Dx: Cataract Right Eye  Post-Op Dx: Combined Cataract Right  Eye,  Dx Code 366.19  Surgeon: [name], M.D.  Assistants: None  Anesthesia: Topical with MAC  Indications: Painless, progressive loss of vision with compromise of daily activities.  Surgery: Cataract Extraction with Intraocular lens Implant Right Eye  Discription: The patient had dilating drops and viscous lidocaine placed into the Right eye in the pre-op holding area. After transfer to the operating room, a time out was performed. The patient was then prepped and draped. Beginning with a 41 degree blade a paracentesis port was made at the surgeon's 2 o'clock position. The anterior chamber was then filled with 1% non-preserved lidocaine. This was followed by filling the anterior chamber with Provisc.  A 2.23mm keratome blade was used to make a clear corneal incision at the temporal limbus.  A bent cystatome needle was used to create a continuous tear capsulotomy. Hydrodissection was performed with balanced salt solution on a Fine canula. The lens nucleus was then removed using the phacoemulsification handpiece. Residual cortex was removed with the I&A handpiece. The anterior chamber and capsular bag were refilled with Provisc. A posterior chamber intraocular lens was placed into the capsular bag with it's injector. The implant was positioned with the Kuglan hook. The Provisc was then removed from the anterior chamber and capsular bag with the I&A handpiece. Stromal hydration of the main incision and paracentesis port was performed with BSS on a Fine canula. The wounds were tested for leak which was negative. The patient tolerated the procedure well. There were no operative complications. The patient was then transferred to the recovery room in stable condition.  Complications: None  Specimen: None  EBL: None  Prosthetic device: Hoya iSert 250, power 21.5 D, SN  [id number].

## 2014-05-24 NOTE — Discharge Instructions (Signed)

## 2014-05-24 NOTE — Anesthesia Postprocedure Evaluation (Signed)
  Anesthesia Post-op Note  Patient: Christopher [name]  Procedure(s) Performed: Procedure(s) with comments: CATARACT EXTRACTION PHACO AND INTRAOCULAR LENS PLACEMENT (IOC) (Right) - CDE 6.74  Patient Location: Short Stay  Anesthesia Type:MAC  Level of Consciousness: awake, alert , oriented and patient cooperative  Airway and Oxygen Therapy: Patient Spontanous Breathing  Post-op Pain: none  Post-op Assessment: Post-op Vital signs reviewed, Patient's Cardiovascular Status Stable, Respiratory Function Stable, Patent Airway and Pain level controlled  Post-op Vital Signs: Reviewed and stable  Last Vitals:  Filed Vitals:   05/24/14 0905  BP: 102/60  Temp:   Resp: 13    Complications: No apparent anesthesia complications

## 2014-05-24 NOTE — H&P (Signed)
I have reviewed the H&P, the patient was re-examined, and I have identified no interval changes in medical condition and plan of care since the history and physical of record  

## 2014-05-24 NOTE — Anesthesia Preprocedure Evaluation (Signed)
Anesthesia Evaluation  Patient identified by MRN, date of birth, ID band Patient awake    Reviewed: Allergy & Precautions, H&P , NPO status , Patient's Chart, lab work & pertinent test results  History of Anesthesia Complications Negative for: history of anesthetic complications  Airway Mallampati: II TM Distance: >3 FB Neck ROM: Full    Dental  (+) Edentulous Upper, Edentulous Lower   Pulmonary former smoker,  breath sounds clear to auscultation  Pulmonary exam normal       Cardiovascular negative cardio ROS  Rhythm:Regular Rate:Normal     Neuro/Psych Chronic back pain    GI/Hepatic Neg liver ROS, GERD-  Medicated and Controlled,  Endo/Other  negative endocrine ROS  Renal/GU negative Renal ROS     Musculoskeletal   Abdominal   Peds  Hematology   Anesthesia Other Findings   Reproductive/Obstetrics                           Anesthesia Physical Anesthesia Plan  ASA: II  Anesthesia Plan: MAC   Post-op Pain Management:    Induction: Intravenous  Airway Management Planned: Nasal Cannula  Additional Equipment:   Intra-op Plan:   Post-operative Plan:   Informed Consent: I have reviewed the patients History and Physical, chart, labs and discussed the procedure including the risks, benefits and alternatives for the proposed anesthesia with the patient or authorized representative who has indicated his/her understanding and acceptance.     Plan Discussed with:   Anesthesia Plan Comments:         Anesthesia Quick Evaluation

## 2014-05-25 ENCOUNTER — Encounter (HOSPITAL_COMMUNITY): Payer: Self-pay | Admitting: Ophthalmology

## 2014-06-14 ENCOUNTER — Encounter (HOSPITAL_COMMUNITY): Payer: Self-pay | Admitting: Pharmacy Technician

## 2014-06-15 ENCOUNTER — Encounter (HOSPITAL_COMMUNITY): Admission: RE | Admit: 2014-06-15 | Payer: Medicare Other | Source: Ambulatory Visit

## 2014-06-17 ENCOUNTER — Encounter (HOSPITAL_COMMUNITY)
Admission: RE | Disposition: A | Discharge status: Home / Self Care | Payer: Self-pay | Source: Ambulatory Visit | Attending: Ophthalmology

## 2014-06-17 ENCOUNTER — Encounter (HOSPITAL_COMMUNITY): Payer: Medicare Other | Admitting: Anesthesiology

## 2014-06-17 ENCOUNTER — Encounter (HOSPITAL_COMMUNITY): Payer: Self-pay | Admitting: Ophthalmology

## 2014-06-17 ENCOUNTER — Ambulatory Visit (HOSPITAL_COMMUNITY): Payer: Medicare Other | Admitting: Anesthesiology

## 2014-06-17 ENCOUNTER — Ambulatory Visit (HOSPITAL_COMMUNITY)
Admission: RE | Admit: 2014-06-17 | Discharge: 2014-06-17 | Disposition: A | Discharge status: Home / Self Care | Payer: Medicare Other | Source: Ambulatory Visit | Attending: Ophthalmology | Admitting: Ophthalmology

## 2014-06-17 DIAGNOSIS — Z79899 Other long term (current) drug therapy: Secondary | ICD-10-CM | POA: Insufficient documentation

## 2014-06-17 DIAGNOSIS — K219 Gastro-esophageal reflux disease without esophagitis: Secondary | ICD-10-CM | POA: Insufficient documentation

## 2014-06-17 DIAGNOSIS — H269 Unspecified cataract: Secondary | ICD-10-CM | POA: Insufficient documentation

## 2014-06-17 DIAGNOSIS — Z87891 Personal history of nicotine dependence: Secondary | ICD-10-CM | POA: Insufficient documentation

## 2014-06-17 HISTORY — PX: CATARACT EXTRACTION W/PHACO: SHX586

## 2014-06-17 SURGERY — PHACOEMULSIFICATION, CATARACT, WITH IOL INSERTION
Anesthesia: Monitor Anesthesia Care | Site: Eye | Laterality: Left

## 2014-06-17 MED ORDER — PROVISC 10 MG/ML IO SOLN
INTRAOCULAR | Status: DC | PRN
  Administered 2014-06-17: 0.85 mL via INTRAOCULAR

## 2014-06-17 MED ORDER — PHENYLEPHRINE HCL 2.5 % OP SOLN
1.0000 [drp] | OPHTHALMIC | Status: AC
  Administered 2014-06-17 (×3): 1 [drp] via OPHTHALMIC

## 2014-06-17 MED ORDER — MIDAZOLAM HCL 2 MG/2ML IJ SOLN
INTRAMUSCULAR | Status: AC
  Filled 2014-06-17: qty 2

## 2014-06-17 MED ORDER — LACTATED RINGERS IV SOLN
INTRAVENOUS | Status: DC
  Administered 2014-06-17: 1000 mL via INTRAVENOUS

## 2014-06-17 MED ORDER — CYCLOPENTOLATE-PHENYLEPHRINE OP SOLN OPTIME - NO CHARGE
OPHTHALMIC | Status: AC
  Filled 2014-06-17: qty 2

## 2014-06-17 MED ORDER — LIDOCAINE HCL (PF) 1 % IJ SOLN
INTRAMUSCULAR | Status: AC
  Filled 2014-06-17: qty 2

## 2014-06-17 MED ORDER — FENTANYL CITRATE 0.05 MG/ML IJ SOLN
25.0000 ug | INTRAMUSCULAR | Status: AC
  Administered 2014-06-17 (×2): 25 ug via INTRAVENOUS

## 2014-06-17 MED ORDER — EPINEPHRINE HCL 1 MG/ML IJ SOLN
INTRAMUSCULAR | Status: DC | PRN
  Administered 2014-06-17: 14:00:00

## 2014-06-17 MED ORDER — TETRACAINE HCL 0.5 % OP SOLN
OPHTHALMIC | Status: AC
  Filled 2014-06-17: qty 2

## 2014-06-17 MED ORDER — LIDOCAINE HCL (PF) 1 % IJ SOLN
INTRAMUSCULAR | Status: DC | PRN
  Administered 2014-06-17: .6 mL

## 2014-06-17 MED ORDER — LIDOCAINE HCL 3.5 % OP GEL
1.0000 "application " | Freq: Once | OPHTHALMIC | Status: AC
  Administered 2014-06-17: 1 via OPHTHALMIC

## 2014-06-17 MED ORDER — CYCLOPENTOLATE-PHENYLEPHRINE 0.2-1 % OP SOLN
1.0000 [drp] | OPHTHALMIC | Status: AC
  Administered 2014-06-17 (×3): 1 [drp] via OPHTHALMIC

## 2014-06-17 MED ORDER — MIDAZOLAM HCL 2 MG/2ML IJ SOLN
1.0000 mg | INTRAMUSCULAR | Status: DC | PRN
  Administered 2014-06-17: 2 mg via INTRAVENOUS

## 2014-06-17 MED ORDER — LIDOCAINE 3.5 % OP GEL OPTIME - NO CHARGE
OPHTHALMIC | Status: DC | PRN
  Administered 2014-06-17: 2 [drp] via OPHTHALMIC

## 2014-06-17 MED ORDER — TETRACAINE HCL 0.5 % OP SOLN
1.0000 [drp] | OPHTHALMIC | Status: AC
  Administered 2014-06-17 (×3): 1 [drp] via OPHTHALMIC

## 2014-06-17 MED ORDER — FENTANYL CITRATE 0.05 MG/ML IJ SOLN
INTRAMUSCULAR | Status: AC
  Filled 2014-06-17: qty 2

## 2014-06-17 MED ORDER — PHENYLEPHRINE HCL 2.5 % OP SOLN
OPHTHALMIC | Status: AC
  Filled 2014-06-17: qty 15

## 2014-06-17 MED ORDER — POVIDONE-IODINE 5 % OP SOLN
OPHTHALMIC | Status: DC | PRN
  Administered 2014-06-17: 1 via OPHTHALMIC

## 2014-06-17 MED ORDER — NEOMYCIN-POLYMYXIN-DEXAMETH 3.5-10000-0.1 OP SUSP
OPHTHALMIC | Status: DC | PRN
  Administered 2014-06-17: 2 [drp] via OPHTHALMIC

## 2014-06-17 MED ORDER — BSS IO SOLN
INTRAOCULAR | Status: DC | PRN
  Administered 2014-06-17: 15 mL

## 2014-06-17 MED ORDER — LIDOCAINE HCL 3.5 % OP GEL
OPHTHALMIC | Status: AC
  Filled 2014-06-17: qty 1

## 2014-06-17 MED ORDER — NEOMYCIN-POLYMYXIN-DEXAMETH 3.5-10000-0.1 OP SUSP
OPHTHALMIC | Status: AC
  Filled 2014-06-17: qty 5

## 2014-06-17 SURGICAL SUPPLY — 34 items
CAPSULAR TENSION RING-AMO (OPHTHALMIC RELATED) IMPLANT
CLOTH BEACON ORANGE TIMEOUT ST (SAFETY) ×2 IMPLANT
EYE SHIELD UNIVERSAL CLEAR (GAUZE/BANDAGES/DRESSINGS) ×2 IMPLANT
GLOVE BIO SURGEON STRL SZ 6.5 (GLOVE) IMPLANT
GLOVE BIOGEL PI IND STRL 6.5 (GLOVE) ×1 IMPLANT
GLOVE BIOGEL PI IND STRL 7.0 (GLOVE) IMPLANT
GLOVE BIOGEL PI IND STRL 7.5 (GLOVE) IMPLANT
GLOVE BIOGEL PI IND STRL 8.5 (GLOVE) ×1 IMPLANT
GLOVE BIOGEL PI INDICATOR 6.5 (GLOVE) ×1
GLOVE BIOGEL PI INDICATOR 7.0 (GLOVE)
GLOVE BIOGEL PI INDICATOR 7.5 (GLOVE)
GLOVE BIOGEL PI INDICATOR 8.5 (GLOVE) ×1
GLOVE ECLIPSE 6.5 STRL STRAW (GLOVE) IMPLANT
GLOVE ECLIPSE 7.0 STRL STRAW (GLOVE) IMPLANT
GLOVE ECLIPSE 7.5 STRL STRAW (GLOVE) IMPLANT
GLOVE EXAM NITRILE LRG STRL (GLOVE) IMPLANT
GLOVE EXAM NITRILE MD LF STRL (GLOVE) IMPLANT
GLOVE SKINSENSE NS SZ6.5 (GLOVE)
GLOVE SKINSENSE NS SZ7.0 (GLOVE)
GLOVE SKINSENSE STRL SZ6.5 (GLOVE) IMPLANT
GLOVE SKINSENSE STRL SZ7.0 (GLOVE) IMPLANT
KIT VITRECTOMY (OPHTHALMIC RELATED) IMPLANT
PAD ARMBOARD 7.5X6 YLW CONV (MISCELLANEOUS) ×2 IMPLANT
PROC W NO LENS (INTRAOCULAR LENS)
PROC W SPEC LENS (INTRAOCULAR LENS)
PROCESS W NO LENS (INTRAOCULAR LENS) IMPLANT
PROCESS W SPEC LENS (INTRAOCULAR LENS) IMPLANT
RING MALYGIN (MISCELLANEOUS) IMPLANT
SIGHTPATH CAT PROC W REG LENS (Ophthalmic Related) ×2 IMPLANT
SYR TB 1ML LL NO SAFETY (SYRINGE) ×2 IMPLANT
TAPE SURG TRANSPORE 1 IN (GAUZE/BANDAGES/DRESSINGS) ×1 IMPLANT
TAPE SURGICAL TRANSPORE 1 IN (GAUZE/BANDAGES/DRESSINGS) ×1
VISCOELASTIC ADDITIONAL (OPHTHALMIC RELATED) IMPLANT
WATER STERILE IRR 250ML POUR (IV SOLUTION) ×2 IMPLANT

## 2014-06-17 NOTE — H&P (Signed)
I have reviewed the H&P, the patient was re-examined, and I have identified no interval changes in medical condition and plan of care since the history and physical of record  

## 2014-06-17 NOTE — Op Note (Signed)
Date of Admission: 06/17/2014  Date of Surgery: 06/17/2014   Pre-Op Dx: Cataract Left Eye  Post-Op Dx: Cataract Left  Eye,  Dx Code 366.19  Surgeon: [name], M.D.  Assistants: None  Anesthesia: Topical with MAC  Indications: Painless, progressive loss of vision with compromise of daily activities.  Surgery: Cataract Extraction with Intraocular lens Implant Left Eye  Discription: The patient had dilating drops and viscous lidocaine placed into the Left eye in the pre-op holding area. After transfer to the operating room, a time out was performed. The patient was then prepped and draped. Beginning with a 52 degree blade a paracentesis port was made at the surgeon's 2 o'clock position. The anterior chamber was then filled with 1% non-preserved lidocaine. This was followed by filling the anterior chamber with Provisc.  A 2.76mm keratome blade was used to make a clear corneal incision at the temporal limbus.  A bent cystatome needle was used to create a continuous tear capsulotomy. Hydrodissection was performed with balanced salt solution on a Fine canula. The lens nucleus was then removed using the phacoemulsification handpiece. Residual cortex was removed with the I&A handpiece. The anterior chamber and capsular bag were refilled with Provisc. A posterior chamber intraocular lens was placed into the capsular bag with it's injector. The implant was positioned with the Kuglan hook. The Provisc was then removed from the anterior chamber and capsular bag with the I&A handpiece. Stromal hydration of the main incision and paracentesis port was performed with BSS on a Fine canula. The wounds were tested for leak which was negative. The patient tolerated the procedure well. There were no operative complications. The patient was then transferred to the recovery room in stable condition.  Complications: None  Specimen: None  EBL: None  Prosthetic device: Hoya iSert 250, power 21.5 D, SN [id number].

## 2014-06-17 NOTE — Anesthesia Preprocedure Evaluation (Signed)
Anesthesia Evaluation  Patient identified by MRN, date of birth, ID band Patient awake    Reviewed: Allergy & Precautions, H&P , NPO status , Patient's Chart, lab work & pertinent test results  History of Anesthesia Complications (+) PONVNegative for: history of anesthetic complications  Airway Mallampati: II TM Distance: >3 FB Neck ROM: Full    Dental  (+) Edentulous Upper, Edentulous Lower   Pulmonary former smoker,  breath sounds clear to auscultation  Pulmonary exam normal       Cardiovascular negative cardio ROS  Rhythm:Regular Rate:Normal     Neuro/Psych Chronic back pain    GI/Hepatic Neg liver ROS, hiatal hernia, GERD-  Medicated and Controlled,  Endo/Other  negative endocrine ROS  Renal/GU negative Renal ROS     Musculoskeletal   Abdominal   Peds  Hematology   Anesthesia Other Findings   Reproductive/Obstetrics                           Anesthesia Physical Anesthesia Plan  ASA: II  Anesthesia Plan: MAC   Post-op Pain Management:    Induction: Intravenous  Airway Management Planned: Nasal Cannula  Additional Equipment:   Intra-op Plan:   Post-operative Plan:   Informed Consent: I have reviewed the patients History and Physical, chart, labs and discussed the procedure including the risks, benefits and alternatives for the proposed anesthesia with the patient or authorized representative who has indicated his/her understanding and acceptance.     Plan Discussed with:   Anesthesia Plan Comments:         Anesthesia Quick Evaluation

## 2014-06-17 NOTE — Discharge Instructions (Signed)

## 2014-06-17 NOTE — Transfer of Care (Signed)
Immediate Anesthesia Transfer of Care Note  Patient: Christopher [name]  Procedure(s) Performed: Procedure(s) with comments: CATARACT EXTRACTION PHACO AND INTRAOCULAR LENS PLACEMENT (IOC) (Left) - CDE:4.65  Patient Location: Short Stay  Anesthesia Type:MAC  Level of Consciousness: awake, alert  and oriented  Airway & Oxygen Therapy: Patient Spontanous Breathing  Post-op Assessment: Report given to PACU RN  Post vital signs: Reviewed  Complications: No apparent anesthesia complications

## 2014-06-17 NOTE — Anesthesia Postprocedure Evaluation (Signed)
  Anesthesia Post-op Note  Patient: Christopher [name]  Procedure(s) Performed: Procedure(s) with comments: CATARACT EXTRACTION PHACO AND INTRAOCULAR LENS PLACEMENT (IOC) (Left) - CDE:4.65  Patient Location: Short Stay  Anesthesia Type:MAC  Level of Consciousness: awake  Airway and Oxygen Therapy: Patient Spontanous Breathing  Post-op Pain: none  Post-op Assessment: Post-op Vital signs reviewed, Patient's Cardiovascular Status Stable, Respiratory Function Stable, Patent Airway and No signs of Nausea or vomiting  Post-op Vital Signs: Reviewed and stable  Last Vitals:  Filed Vitals:   06/17/14 1405  BP: 113/72  Temp:   Resp: 12    Complications: No apparent anesthesia complications

## 2014-06-21 ENCOUNTER — Encounter (HOSPITAL_COMMUNITY): Payer: Self-pay | Admitting: Ophthalmology

## 2014-06-24 ENCOUNTER — Other Ambulatory Visit (HOSPITAL_COMMUNITY): Payer: Self-pay | Admitting: Family Medicine

## 2014-06-24 ENCOUNTER — Ambulatory Visit (HOSPITAL_COMMUNITY)
Admission: RE | Admit: 2014-06-24 | Discharge: 2014-06-24 | Disposition: A | Discharge status: Home / Self Care | Payer: Medicare Other | Source: Ambulatory Visit | Attending: Family Medicine | Admitting: Family Medicine

## 2014-06-24 DIAGNOSIS — F17200 Nicotine dependence, unspecified, uncomplicated: Secondary | ICD-10-CM | POA: Insufficient documentation

## 2014-06-24 DIAGNOSIS — Z87891 Personal history of nicotine dependence: Secondary | ICD-10-CM

## 2014-07-07 ENCOUNTER — Other Ambulatory Visit (HOSPITAL_COMMUNITY): Payer: Self-pay | Admitting: Family Medicine

## 2014-07-07 DIAGNOSIS — Z139 Encounter for screening, unspecified: Secondary | ICD-10-CM

## 2014-07-07 DIAGNOSIS — Z87891 Personal history of nicotine dependence: Secondary | ICD-10-CM

## 2014-07-09 ENCOUNTER — Ambulatory Visit (HOSPITAL_COMMUNITY)
Admission: RE | Admit: 2014-07-09 | Discharge: 2014-07-09 | Disposition: A | Discharge status: Home / Self Care | Payer: Medicare Other | Source: Ambulatory Visit | Attending: Family Medicine | Admitting: Family Medicine

## 2014-07-09 DIAGNOSIS — Z87891 Personal history of nicotine dependence: Secondary | ICD-10-CM | POA: Diagnosis not present

## 2014-07-09 DIAGNOSIS — Z139 Encounter for screening, unspecified: Secondary | ICD-10-CM | POA: Diagnosis not present

## 2014-12-15 ENCOUNTER — Ambulatory Visit (HOSPITAL_COMMUNITY)
Admission: RE | Admit: 2014-12-15 | Discharge: 2014-12-15 | Disposition: A | Discharge status: Home / Self Care | Payer: Medicare Other | Source: Ambulatory Visit | Attending: Family Medicine | Admitting: Family Medicine

## 2014-12-15 ENCOUNTER — Other Ambulatory Visit (HOSPITAL_COMMUNITY): Payer: Self-pay | Admitting: Family Medicine

## 2014-12-15 DIAGNOSIS — R52 Pain, unspecified: Secondary | ICD-10-CM

## 2014-12-15 DIAGNOSIS — R0781 Pleurodynia: Secondary | ICD-10-CM | POA: Insufficient documentation

## 2015-03-24 ENCOUNTER — Other Ambulatory Visit: Payer: Self-pay | Admitting: Neurosurgery

## 2015-03-24 DIAGNOSIS — M5440 Lumbago with sciatica, unspecified side: Secondary | ICD-10-CM

## 2015-03-24 DIAGNOSIS — M5416 Radiculopathy, lumbar region: Secondary | ICD-10-CM

## 2015-04-05 ENCOUNTER — Ambulatory Visit
Admission: RE | Admit: 2015-04-05 | Discharge: 2015-04-05 | Disposition: A | Discharge status: Home / Self Care | Payer: Medicare Other | Source: Ambulatory Visit | Attending: Neurosurgery | Admitting: Neurosurgery

## 2015-04-05 VITALS — BP 106/63 | HR 50

## 2015-04-05 DIAGNOSIS — M5440 Lumbago with sciatica, unspecified side: Secondary | ICD-10-CM

## 2015-04-05 DIAGNOSIS — M4316 Spondylolisthesis, lumbar region: Secondary | ICD-10-CM

## 2015-04-05 DIAGNOSIS — M5416 Radiculopathy, lumbar region: Secondary | ICD-10-CM

## 2015-04-05 MED ORDER — DIAZEPAM 5 MG PO TABS
5.0000 mg | ORAL_TABLET | Freq: Once | ORAL | Status: AC
  Administered 2015-04-05: 5 mg via ORAL

## 2015-04-05 MED ORDER — IOHEXOL 180 MG/ML  SOLN
15.0000 mL | Freq: Once | INTRAMUSCULAR | Status: AC | PRN
  Administered 2015-04-05: 15 mL via INTRATHECAL

## 2015-04-05 NOTE — Discharge Instructions (Signed)

## 2015-05-11 ENCOUNTER — Ambulatory Visit (HOSPITAL_COMMUNITY)
Admission: RE | Admit: 2015-05-11 | Discharge: 2015-05-11 | Disposition: A | Discharge status: Home / Self Care | Payer: Medicare Other | Source: Ambulatory Visit | Attending: Family Medicine | Admitting: Family Medicine

## 2015-05-11 ENCOUNTER — Other Ambulatory Visit (HOSPITAL_COMMUNITY): Payer: Self-pay | Admitting: Family Medicine

## 2015-05-11 DIAGNOSIS — M7989 Other specified soft tissue disorders: Secondary | ICD-10-CM | POA: Diagnosis present

## 2015-05-11 DIAGNOSIS — M7732 Calcaneal spur, left foot: Secondary | ICD-10-CM | POA: Insufficient documentation

## 2015-05-11 DIAGNOSIS — X58XXXA Exposure to other specified factors, initial encounter: Secondary | ICD-10-CM | POA: Diagnosis not present

## 2015-05-11 DIAGNOSIS — S92425A Nondisplaced fracture of distal phalanx of left great toe, initial encounter for closed fracture: Secondary | ICD-10-CM | POA: Insufficient documentation

## 2015-05-11 DIAGNOSIS — M79672 Pain in left foot: Secondary | ICD-10-CM

## 2015-09-07 ENCOUNTER — Other Ambulatory Visit: Payer: Self-pay | Admitting: Anesthesiology

## 2015-09-20 ENCOUNTER — Encounter (HOSPITAL_COMMUNITY)
Admission: RE | Admit: 2015-09-20 | Discharge: 2015-09-20 | Disposition: A | Discharge status: Home / Self Care | Payer: Medicare Other | Source: Ambulatory Visit | Attending: Anesthesiology | Admitting: Anesthesiology

## 2015-09-20 ENCOUNTER — Encounter (HOSPITAL_COMMUNITY): Payer: Self-pay

## 2015-09-20 ENCOUNTER — Other Ambulatory Visit: Payer: Self-pay

## 2015-09-20 DIAGNOSIS — G4700 Insomnia, unspecified: Secondary | ICD-10-CM | POA: Diagnosis not present

## 2015-09-20 DIAGNOSIS — M5416 Radiculopathy, lumbar region: Secondary | ICD-10-CM | POA: Diagnosis not present

## 2015-09-20 DIAGNOSIS — Z87891 Personal history of nicotine dependence: Secondary | ICD-10-CM | POA: Diagnosis not present

## 2015-09-20 DIAGNOSIS — G8929 Other chronic pain: Secondary | ICD-10-CM | POA: Diagnosis not present

## 2015-09-20 DIAGNOSIS — M545 Low back pain: Secondary | ICD-10-CM | POA: Diagnosis not present

## 2015-09-20 DIAGNOSIS — Z791 Long term (current) use of non-steroidal anti-inflammatories (NSAID): Secondary | ICD-10-CM | POA: Diagnosis not present

## 2015-09-20 DIAGNOSIS — H3530 Unspecified macular degeneration: Secondary | ICD-10-CM | POA: Diagnosis not present

## 2015-09-20 DIAGNOSIS — M961 Postlaminectomy syndrome, not elsewhere classified: Secondary | ICD-10-CM | POA: Diagnosis present

## 2015-09-20 DIAGNOSIS — G629 Polyneuropathy, unspecified: Secondary | ICD-10-CM | POA: Diagnosis not present

## 2015-09-20 DIAGNOSIS — N400 Enlarged prostate without lower urinary tract symptoms: Secondary | ICD-10-CM | POA: Diagnosis not present

## 2015-09-20 DIAGNOSIS — M1990 Unspecified osteoarthritis, unspecified site: Secondary | ICD-10-CM | POA: Diagnosis not present

## 2015-09-20 DIAGNOSIS — K219 Gastro-esophageal reflux disease without esophagitis: Secondary | ICD-10-CM | POA: Diagnosis not present

## 2015-09-20 DIAGNOSIS — Z79899 Other long term (current) drug therapy: Secondary | ICD-10-CM | POA: Diagnosis not present

## 2015-09-20 DIAGNOSIS — Z79891 Long term (current) use of opiate analgesic: Secondary | ICD-10-CM | POA: Diagnosis not present

## 2015-09-20 DIAGNOSIS — Z981 Arthrodesis status: Secondary | ICD-10-CM | POA: Diagnosis not present

## 2015-09-20 HISTORY — DX: Personal history of colon polyps, unspecified: Z86.0100

## 2015-09-20 HISTORY — DX: Polyneuropathy, unspecified: G62.9

## 2015-09-20 HISTORY — DX: Dorsalgia, unspecified: M54.9

## 2015-09-20 HISTORY — DX: Unspecified macular degeneration: H35.30

## 2015-09-20 HISTORY — DX: Benign prostatic hyperplasia without lower urinary tract symptoms: N40.0

## 2015-09-20 HISTORY — DX: Other chronic pain: G89.29

## 2015-09-20 HISTORY — DX: Constipation, unspecified: K59.00

## 2015-09-20 HISTORY — DX: Personal history of peptic ulcer disease: Z87.11

## 2015-09-20 HISTORY — DX: Effusion, unspecified joint: M25.40

## 2015-09-20 HISTORY — DX: Insomnia, unspecified: G47.00

## 2015-09-20 HISTORY — DX: Muscle spasm of back: M62.830

## 2015-09-20 HISTORY — DX: Weakness: R53.1

## 2015-09-20 HISTORY — DX: Pain in unspecified joint: M25.50

## 2015-09-20 HISTORY — DX: Personal history of colonic polyps: Z86.010

## 2015-09-20 HISTORY — DX: Personal history of other diseases of the digestive system: Z87.19

## 2015-09-20 LAB — HEPATIC FUNCTION PANEL
ALT: 20 U/L (ref 17–63)
AST: 27 U/L (ref 15–41)
Albumin: 3.8 g/dL (ref 3.5–5.0)
Alkaline Phosphatase: 74 U/L (ref 38–126)
Bilirubin, Direct: <0.1 mg/dL — ABNORMAL LOW (ref 0.1–0.5)
Total Bilirubin: 0.6 mg/dL (ref 0.3–1.2)
Total Protein: 6.6 g/dL (ref 6.5–8.1)

## 2015-09-20 LAB — SURGICAL PCR SCREEN
MRSA, PCR: NEGATIVE
Staphylococcus aureus: POSITIVE — AB

## 2015-09-20 LAB — CBC
HCT: 42.7 % (ref 39.0–52.0)
Hemoglobin: 14.3 g/dL (ref 13.0–17.0)
MCH: 31.2 pg (ref 26.0–34.0)
MCHC: 33.5 g/dL (ref 30.0–36.0)
MCV: 93.2 fL (ref 78.0–100.0)
Platelets: 215 10*3/uL (ref 150–400)
RBC: 4.58 MIL/uL (ref 4.22–5.81)
RDW: 12.5 % (ref 11.5–15.5)
WBC: 4.5 10*3/uL (ref 4.0–10.5)

## 2015-09-20 LAB — BASIC METABOLIC PANEL
Anion gap: 8 (ref 5–15)
BUN: 8 mg/dL (ref 6–20)
CO2: 24 mmol/L (ref 22–32)
Calcium: 9.3 mg/dL (ref 8.9–10.3)
Chloride: 107 mmol/L (ref 101–111)
Creatinine, Ser: 0.79 mg/dL (ref 0.61–1.24)
GFR calc Af Amer: >60 mL/min (ref >60)
GFR calc non Af Amer: >60 mL/min (ref >60)
Glucose, Bld: 149 mg/dL — ABNORMAL HIGH (ref 65–99)
Potassium: 3.6 mmol/L (ref 3.5–5.1)
Sodium: 139 mmol/L (ref 135–145)

## 2015-09-20 NOTE — Progress Notes (Addendum)
Cardiologist denies having one  Medical Md is Dr.Angus McInnis  EKG denies having one in past yr  CXR denies having one in past yr  Echo done around 2006  Stress test done around 2006  Heart cath done around 2006  States echo/stress/cath done  as part of clearance for surgery

## 2015-09-20 NOTE — Progress Notes (Signed)
Anesthesia Chart Review:  Pt is 69 year old male scheduled for lumbar spinal cord stimulator insertion on 09/23/2015 with Dr. Maryjean Ka.   PMH includes: GERD, chronic back pain, history of gastric ulcer. Post-op N/V. Former smoker. BMI 25.5. S/p cataract extractions 06/17/14 and 05/24/14. S/p L4-5 PLIF 03/05/14.   Medications include: nexium.   Preoperative labs reviewed.    EKG 09/20/2015: Sinus bradycardia (57 bpm). Incomplete RBBB.   If no changes, I anticipate pt can proceed with surgery as scheduled.   Willeen Cass, FNP-BC [facility]/Anesthesiology Phone: [phone] 09/20/2015 2:17 PM

## 2015-09-20 NOTE — Pre-Procedure Instructions (Signed)
Christopher [name]  09/20/2015      [id number] - [place], [place] - [address] [place] [address] Phone: [phone] Fax: [phone]    Your procedure is scheduled on Fri, Oct 7 @ 7:30 AM  Report to [facility] Admitting at 5:30 AM  Call this number if you have problems the morning of surgery:  [phone]   Remember:  Do not eat food or drink liquids after midnight.  Take these medicines the morning of surgery with A SIP OF WATER Uroxatral(Alfuzosin),Valium(Diazepam-if needed),Nexium(Esomeprazole),Proscar(Finasteride),Gabapentin(Neurontin),and Pain Pill(if needed)              Stop taking your Fish Oil,Naproxen,and Meloxicam. No Goody's,BC's,Ibuprofen,or any Herbal Medications.    Do not wear jewelry.  Do not wear lotions, powders, or colognes.  You may wear deodorant.  Men may shave face and neck.  Do not bring valuables to the hospital.  [facility] is not responsible for any belongings or valuables.  Contacts, dentures or bridgework may not be worn into surgery.  Leave your suitcase in the car.  After surgery it may be brought to your room.  For patients admitted to the hospital, discharge time will be determined by your treatment team.  Patients discharged the day of surgery will not be allowed to drive home.    Special instructions: [place] - Preparing for Surgery  Before surgery, you can play an important role.  Because skin is not sterile, your skin needs to be as free of germs as possible.  You can reduce the number of germs on you skin by washing with CHG (chlorahexidine gluconate) soap before surgery.  CHG is an antiseptic cleaner which kills germs and bonds with the skin to continue killing germs even after washing.  Please DO NOT use if you have an allergy to CHG or antibacterial soaps.  If your skin becomes reddened/irritated stop using the CHG and inform your nurse when you arrive at Short  Stay.  Do not shave (including legs and underarms) for at least 48 hours prior to the first CHG shower.  You may shave your face.  Please follow these instructions carefully:   1.  Shower with CHG Soap the night before surgery and the                                morning of Surgery.  2.  If you choose to wash your hair, wash your hair first as usual with your       normal shampoo.  3.  After you shampoo, rinse your hair and body thoroughly to remove the                      Shampoo.  4.  Use CHG as you would any other liquid soap.  You can apply chg directly       to the skin and wash gently with scrungie or a clean washcloth.  5.  Apply the CHG Soap to your body ONLY FROM THE NECK DOWN.        Do not use on open wounds or open sores.  Avoid contact with your eyes,       ears, mouth and genitals (private parts).  Wash genitals (private parts)       with your normal soap.  6.  Wash thoroughly, paying special attention to the area  where your surgery        will be performed.  7.  Thoroughly rinse your body with warm water from the neck down.  8.  DO NOT shower/wash with your normal soap after using and rinsing off       the CHG Soap.  9.  Pat yourself dry with a clean towel.            10.  Wear clean pajamas.            11.  Place clean sheets on your bed the night of your first shower and do not        sleep with pets.  Day of Surgery  Do not apply any lotions/deoderants the morning of surgery.  Please wear clean clothes to the hospital/surgery center.    Please read over the following fact sheets that you were given. Pain Booklet, Coughing and Deep Breathing, MRSA Information and Surgical Site Infection Prevention

## 2015-09-20 NOTE — Progress Notes (Signed)
[facility] [place]

## 2015-09-22 MED ORDER — CEFAZOLIN SODIUM-DEXTROSE 2-3 GM-% IV SOLR
2.0000 g | INTRAVENOUS | Status: AC
  Administered 2015-09-23: 2 g via INTRAVENOUS
  Filled 2015-09-22: qty 50

## 2015-09-22 NOTE — H&P (Signed)
Christopher [name] is an 69 y.o. male.   Chief Complaint: back pain with radiation into the legs and feet, left greater than right HPI: 69 year old right-handed gentleman, referred to me by my partner Dr. Joya Salm, for management of pain complaints that include left-sided low back pain, mid lumbar pain, but radiation/radicular symptoms into the posterior buttocks, posterior thigh, posterior calf and bottom of left foot.  Christopher [name] has a history of cervical and lumbar degenerative disc disease, and status post ACDF with Dr. Joya Salm many years ago.  Late in 2014 patient had progressive back pain, symptoms of neurogenic claudication, grade 2 spondylolisthesis and stenosis was identified at L4 L5 and the patient went underwent subsequent decompression and fusion with Dr. Joya Salm.  Patient reports that for about 2-3 months after his surgery he had good resolution of almost all of his symptoms except for postsurgical back pain.  Then without any distinct inciting event or trauma bay to 9 months ago he began having more symptoms, that he describes as being the same distribution  of pain symptoms that he was having prior to his surgery.  Dr. Joya Salm has reimaged the patient.  His fusion appears to be intact.  He has some mild adjacent segment disc disease at 33 without any distinct, neural compromise.  He has some mild facet arthropathy and disc change but more dominant on the right side.  None of these lesions are deemed to be surgical, nor did they explain the patient's general distribution of left-sided pain.  He was thus referred to me in the absence of a surgical lesion for management of his residual pain. We did adjust his medicines somewhat to maximize their efficacy, but he still continued to have substantial pain that interfered with his daily activities.  We thus consider a trial offinal course later therapy.  The patient went through that trial, with substantial improvement in his pain, particularly in his foot.  He  was able to engage in normal activities of daily living, as well as some fairly exertional activity including chainsaw use. Given these notable improvements in his overall pain control, he is deemed to be a good candidate for permanent implantation.   Past Medical History  Diagnosis Date  . H/O hiatal hernia   . Arthritis   . PONV (postoperative nausea and vomiting)   . BPH (benign prostatic hyperplasia)     takes Proscar daily  . Insomnia     takes Xanax nightly  . GERD (gastroesophageal reflux disease)     takes Nexium daily  . Constipation     takes Colace daily  . Muscle spasm of back     takes Valium daily as needed  . Headache(784.0)     occasionally  . Weakness     numbness and tingling  . Peripheral neuropathy (HCC)     takes Gabapentin daily  . Joint pain   . Joint swelling   . Chronic back pain     DDD  . History of gastric ulcer   . History of colon polyps     benign  . Macular degeneration     Past Surgical History  Procedure Laterality Date  . Neck surgery      x 3  . Rt shoulder    . Appendectomy    . Colonoscopy with esophagogastroduodenoscopy (egd) N/A 01/22/2013    Procedure: COLONOSCOPY WITH ESOPHAGOGASTRODUODENOSCOPY (EGD);  Surgeon: [name], MD;  Location: [facility];  Service: Endoscopy;  Laterality: N/A;  140  .  Carpal tunnel release Bilateral   . Cataract extraction w/phaco Right 05/24/2014    Procedure: CATARACT EXTRACTION PHACO AND INTRAOCULAR LENS PLACEMENT (IOC);  Surgeon: [name], MD;  Location: [facility];  Service: Ophthalmology;  Laterality: Right;  CDE 6.74  . Cataract extraction w/phaco Left 06/17/2014    Procedure: CATARACT EXTRACTION PHACO AND INTRAOCULAR LENS PLACEMENT (IOC);  Surgeon: [name], MD;  Location: [facility];  Service: Ophthalmology;  Laterality: Left;  CDE:4.65  . Back surgery      x 4-with fusion being one of those times    History reviewed. No pertinent family history. Social History:  reports that he has quit  smoking. He does not have any smokeless tobacco history on file. He reports that he drinks alcohol. He reports that he does not use illicit drugs.  Allergies:  Allergies  Allergen Reactions  . Morphine And Related Nausea And Vomiting    Medications Prior to Admission  Medication Sig Dispense Refill  . alfuzosin (UROXATRAL) 10 MG 24 hr tablet Take 10 mg by mouth daily with breakfast.    . ALPRAZolam (XANAX) 1 MG tablet Take 1 mg by mouth at bedtime.     . diazepam (VALIUM) 5 MG tablet Take 1 tablet by mouth daily as needed for muscle spasms.     [name] docusate sodium (COLACE) 100 MG capsule Take 200 mg by mouth daily.    [name] esomeprazole (NEXIUM) 40 MG capsule Take 40 mg by mouth daily.     . finasteride (PROSCAR) 5 MG tablet Take 5 mg by mouth daily.    [name] gabapentin (NEURONTIN) 400 MG capsule Take 400 mg by mouth 2 (two) times daily.     . Ginkgo Biloba (GNP GINGKO BILOBA EXTRACT PO) Take 1 tablet by mouth daily.    [name] HYDROcodone-acetaminophen (NORCO) 10-325 MG per tablet Take 1 tablet by mouth every 6 (six) hours as needed for moderate pain.    . meloxicam (MOBIC) 15 MG tablet Take 15 mg by mouth daily.  2  . Misc Natural Products (GLUCOSAMINE CHONDROITIN VIT D3 PO) Take 1 tablet by mouth daily.    . naproxen sodium (ANAPROX) 220 MG tablet Take 440 mg by mouth daily as needed (back pain).    . Omega-3 Fatty Acids (FISH OIL) 1200 MG CAPS Take 1,200 mg by mouth daily.      No results found for this or any previous visit (from the past 48 hour(s)). No results found.  Review of Systems  Constitutional: Negative.   Eyes: Negative.   Respiratory: Negative.   Cardiovascular: Negative.   Gastrointestinal: Negative.   Musculoskeletal: Positive for back pain and neck pain. Negative for myalgias and falls.  Skin: Negative.   Neurological: Negative.   Endo/Heme/Allergies: Negative.     Blood pressure 156/76, pulse 50, temperature 97.5 F (36.4 C), temperature source Oral, resp. rate 18,  height 5\' 6"  (1.676 m), weight 71.668 kg (158 lb), SpO2 99 %. Physical Exam  Constitutional: He is oriented to person, place, and time. He appears well-developed and well-nourished.  HENT:  Head: Normocephalic and atraumatic.  Eyes: EOM are normal. Pupils are equal, round, and reactive to light.  Neck: Normal range of motion.  Cardiovascular: Normal rate and regular rhythm.   Respiratory: Effort normal and breath sounds normal.  Musculoskeletal: Normal range of motion.  Neurological: He is alert and oriented to person, place, and time.  Skin: Skin is warm and dry.  Psychiatric: He has a normal mood and affect. His behavior is  normal. Judgment and thought content normal.     Assessment/Plan 1) lumbago 2) radiculopathy 3) chronic pain 4) lumbar post-laminectomy syndrome  PLAN: permanent SCS implant, [employer]  Bonna Gains 09/23/2015, 7:30 AM

## 2015-09-23 ENCOUNTER — Ambulatory Visit (HOSPITAL_COMMUNITY): Payer: Medicare Other | Admitting: Emergency Medicine

## 2015-09-23 ENCOUNTER — Ambulatory Visit (HOSPITAL_COMMUNITY)
Admission: RE | Admit: 2015-09-23 | Discharge: 2015-09-23 | Disposition: A | Discharge status: Home / Self Care | Payer: Medicare Other | Source: Ambulatory Visit | Attending: Anesthesiology | Admitting: Anesthesiology

## 2015-09-23 ENCOUNTER — Ambulatory Visit (HOSPITAL_COMMUNITY): Payer: Medicare Other

## 2015-09-23 ENCOUNTER — Encounter (HOSPITAL_COMMUNITY)
Admission: RE | Disposition: A | Discharge status: Home / Self Care | Payer: Self-pay | Source: Ambulatory Visit | Attending: Anesthesiology

## 2015-09-23 ENCOUNTER — Ambulatory Visit (HOSPITAL_COMMUNITY): Payer: Medicare Other | Admitting: Anesthesiology

## 2015-09-23 ENCOUNTER — Encounter (HOSPITAL_COMMUNITY): Payer: Self-pay | Admitting: *Deleted

## 2015-09-23 DIAGNOSIS — Z79891 Long term (current) use of opiate analgesic: Secondary | ICD-10-CM | POA: Insufficient documentation

## 2015-09-23 DIAGNOSIS — G629 Polyneuropathy, unspecified: Secondary | ICD-10-CM | POA: Insufficient documentation

## 2015-09-23 DIAGNOSIS — M5416 Radiculopathy, lumbar region: Secondary | ICD-10-CM | POA: Diagnosis not present

## 2015-09-23 DIAGNOSIS — Z87891 Personal history of nicotine dependence: Secondary | ICD-10-CM | POA: Insufficient documentation

## 2015-09-23 DIAGNOSIS — G4700 Insomnia, unspecified: Secondary | ICD-10-CM | POA: Insufficient documentation

## 2015-09-23 DIAGNOSIS — M545 Low back pain: Secondary | ICD-10-CM | POA: Insufficient documentation

## 2015-09-23 DIAGNOSIS — M549 Dorsalgia, unspecified: Secondary | ICD-10-CM

## 2015-09-23 DIAGNOSIS — N400 Enlarged prostate without lower urinary tract symptoms: Secondary | ICD-10-CM | POA: Insufficient documentation

## 2015-09-23 DIAGNOSIS — K219 Gastro-esophageal reflux disease without esophagitis: Secondary | ICD-10-CM | POA: Insufficient documentation

## 2015-09-23 DIAGNOSIS — H3530 Unspecified macular degeneration: Secondary | ICD-10-CM | POA: Insufficient documentation

## 2015-09-23 DIAGNOSIS — Z981 Arthrodesis status: Secondary | ICD-10-CM | POA: Insufficient documentation

## 2015-09-23 DIAGNOSIS — G8929 Other chronic pain: Secondary | ICD-10-CM | POA: Diagnosis not present

## 2015-09-23 DIAGNOSIS — M961 Postlaminectomy syndrome, not elsewhere classified: Secondary | ICD-10-CM | POA: Diagnosis not present

## 2015-09-23 DIAGNOSIS — M1990 Unspecified osteoarthritis, unspecified site: Secondary | ICD-10-CM | POA: Insufficient documentation

## 2015-09-23 DIAGNOSIS — Z79899 Other long term (current) drug therapy: Secondary | ICD-10-CM | POA: Insufficient documentation

## 2015-09-23 DIAGNOSIS — Z791 Long term (current) use of non-steroidal anti-inflammatories (NSAID): Secondary | ICD-10-CM | POA: Insufficient documentation

## 2015-09-23 HISTORY — PX: SPINAL CORD STIMULATOR INSERTION: SHX5378

## 2015-09-23 SURGERY — INSERTION, SPINAL CORD STIMULATOR, LUMBAR
Anesthesia: Monitor Anesthesia Care | Site: Back

## 2015-09-23 MED ORDER — ONDANSETRON HCL 4 MG/2ML IJ SOLN
INTRAMUSCULAR | Status: AC
  Filled 2015-09-23: qty 2

## 2015-09-23 MED ORDER — SODIUM CHLORIDE 0.9 % IR SOLN
Status: DC | PRN
  Administered 2015-09-23: 500 mL

## 2015-09-23 MED ORDER — HYDROMORPHONE HCL 1 MG/ML IJ SOLN: 0.2500 mg | INTRAMUSCULAR | Status: DC | PRN

## 2015-09-23 MED ORDER — MIDAZOLAM HCL 2 MG/2ML IJ SOLN
INTRAMUSCULAR | Status: DC | PRN
  Administered 2015-09-23: 2 mg via INTRAVENOUS

## 2015-09-23 MED ORDER — CEPHALEXIN 500 MG PO CAPS: 500.0000 mg | ORAL_CAPSULE | Freq: Four times a day (QID) | ORAL | Status: DC

## 2015-09-23 MED ORDER — DEXAMETHASONE SODIUM PHOSPHATE 4 MG/ML IJ SOLN
INTRAMUSCULAR | Status: AC
  Filled 2015-09-23: qty 2

## 2015-09-23 MED ORDER — 0.9 % SODIUM CHLORIDE (POUR BTL) OPTIME
TOPICAL | Status: DC | PRN
  Administered 2015-09-23: 1000 mL

## 2015-09-23 MED ORDER — FENTANYL CITRATE (PF) 250 MCG/5ML IJ SOLN
INTRAMUSCULAR | Status: AC
  Filled 2015-09-23: qty 5

## 2015-09-23 MED ORDER — LACTATED RINGERS IV SOLN
INTRAVENOUS | Status: DC | PRN
  Administered 2015-09-23: 07:00:00 via INTRAVENOUS

## 2015-09-23 MED ORDER — MIDAZOLAM HCL 2 MG/2ML IJ SOLN
INTRAMUSCULAR | Status: AC
  Filled 2015-09-23: qty 4

## 2015-09-23 MED ORDER — LIDOCAINE HCL (CARDIAC) 20 MG/ML IV SOLN
INTRAVENOUS | Status: AC
  Filled 2015-09-23: qty 5

## 2015-09-23 MED ORDER — BACITRACIN-NEOMYCIN-POLYMYXIN OINTMENT TUBE
TOPICAL_OINTMENT | CUTANEOUS | Status: DC | PRN
  Administered 2015-09-23: 1 via TOPICAL

## 2015-09-23 MED ORDER — ROCURONIUM BROMIDE 50 MG/5ML IV SOLN
INTRAVENOUS | Status: AC
  Filled 2015-09-23: qty 1

## 2015-09-23 MED ORDER — PROPOFOL 500 MG/50ML IV EMUL
INTRAVENOUS | Status: DC | PRN
  Administered 2015-09-23: 25 ug/kg/min via INTRAVENOUS

## 2015-09-23 MED ORDER — PROPOFOL 10 MG/ML IV BOLUS
INTRAVENOUS | Status: AC
  Filled 2015-09-23: qty 20

## 2015-09-23 MED ORDER — LIDOCAINE HCL (CARDIAC) 20 MG/ML IV SOLN
INTRAVENOUS | Status: DC | PRN
  Administered 2015-09-23: 80 mg via INTRAVENOUS

## 2015-09-23 MED ORDER — OXYCODONE-ACETAMINOPHEN 10-325 MG PO TABS: 1.0000 | ORAL_TABLET | ORAL | Status: DC | PRN

## 2015-09-23 MED ORDER — PROPOFOL 10 MG/ML IV BOLUS
INTRAVENOUS | Status: DC | PRN
  Administered 2015-09-23 (×3): 10 mg via INTRAVENOUS

## 2015-09-23 MED ORDER — BUPIVACAINE-EPINEPHRINE (PF) 0.5% -1:200000 IJ SOLN
INTRAMUSCULAR | Status: DC | PRN
  Administered 2015-09-23: 45 mL via PERINEURAL

## 2015-09-23 MED ORDER — FENTANYL CITRATE (PF) 250 MCG/5ML IJ SOLN
INTRAMUSCULAR | Status: DC | PRN
  Administered 2015-09-23 (×3): 25 ug via INTRAVENOUS
  Administered 2015-09-23: 50 ug via INTRAVENOUS
  Administered 2015-09-23 (×3): 25 ug via INTRAVENOUS
  Administered 2015-09-23: 50 ug via INTRAVENOUS

## 2015-09-23 SURGICAL SUPPLY — 61 items
ANCHOR CLIK X NEURO (Stimulator) ×2 IMPLANT
BAG DECANTER FOR FLEXI CONT (MISCELLANEOUS) ×2 IMPLANT
BENZOIN TINCTURE PRP APPL 2/3 (GAUZE/BANDAGES/DRESSINGS) IMPLANT
BINDER ABDOMINAL 12 ML 46-62 (SOFTGOODS) ×2 IMPLANT
BLADE CLIPPER SURG (BLADE) IMPLANT
CABLE/EXTENSION OR 1X16 61 (CABLE) ×4 IMPLANT
CHLORAPREP W/TINT 26ML (MISCELLANEOUS) ×2 IMPLANT
CLIP TI WIDE RED SMALL 6 (CLIP) IMPLANT
DRAPE C-ARM 42X72 X-RAY (DRAPES) ×2 IMPLANT
DRAPE C-ARMOR (DRAPES) ×2 IMPLANT
DRAPE LAPAROTOMY 100X72X124 (DRAPES) ×2 IMPLANT
DRAPE POUCH INSTRU U-SHP 10X18 (DRAPES) ×2 IMPLANT
DRAPE SURG 17X23 STRL (DRAPES) ×2 IMPLANT
DRSG OPSITE POSTOP 3X4 (GAUZE/BANDAGES/DRESSINGS) IMPLANT
DRSG OPSITE POSTOP 4X6 (GAUZE/BANDAGES/DRESSINGS) IMPLANT
ELECT REM PT RETURN 9FT ADLT (ELECTROSURGICAL) ×2
ELECTRODE REM PT RTRN 9FT ADLT (ELECTROSURGICAL) ×1 IMPLANT
GAUZE SPONGE 4X4 16PLY XRAY LF (GAUZE/BANDAGES/DRESSINGS) ×2 IMPLANT
GLOVE BIOGEL PI IND STRL 7.5 (GLOVE) ×1 IMPLANT
GLOVE BIOGEL PI INDICATOR 7.5 (GLOVE) ×1
GLOVE ECLIPSE 7.5 STRL STRAW (GLOVE) ×2 IMPLANT
GLOVE EXAM NITRILE LRG STRL (GLOVE) IMPLANT
GLOVE EXAM NITRILE MD LF STRL (GLOVE) IMPLANT
GLOVE EXAM NITRILE XL STR (GLOVE) IMPLANT
GLOVE EXAM NITRILE XS STR PU (GLOVE) IMPLANT
GOWN STRL REUS W/ TWL LRG LVL3 (GOWN DISPOSABLE) IMPLANT
GOWN STRL REUS W/ TWL XL LVL3 (GOWN DISPOSABLE) IMPLANT
GOWN STRL REUS W/TWL 2XL LVL3 (GOWN DISPOSABLE) IMPLANT
GOWN STRL REUS W/TWL LRG LVL3 (GOWN DISPOSABLE)
GOWN STRL REUS W/TWL XL LVL3 (GOWN DISPOSABLE)
IPG PRECISION SPECTRA (Stimulator) ×2 IMPLANT
KIT BASIN OR (CUSTOM PROCEDURE TRAY) ×2 IMPLANT
KIT CHARGING (KITS) ×1
KIT CHARGING PRECISION NEURO (KITS) ×1 IMPLANT
KIT PAT PROGRAM FREELINK (KITS) ×1 IMPLANT
KIT ROOM TURNOVER OR (KITS) ×2 IMPLANT
KIT SPLITTER 30CM 2X8 (Stimulator) ×4 IMPLANT
LEAD KIT CONTACT INFINION 16 (Stimulator) ×4 IMPLANT
LIQUID BAND (GAUZE/BANDAGES/DRESSINGS) ×2 IMPLANT
NEEDLE 18GX1X1/2 (RX/OR ONLY) (NEEDLE) IMPLANT
NEEDLE HYPO 25X1 1.5 SAFETY (NEEDLE) ×2 IMPLANT
NS IRRIG 1000ML POUR BTL (IV SOLUTION) ×2 IMPLANT
PACK LAMINECTOMY NEURO (CUSTOM PROCEDURE TRAY) ×2 IMPLANT
PAD ARMBOARD 7.5X6 YLW CONV (MISCELLANEOUS) ×2 IMPLANT
REMOTE CONTROL KIT (KITS) ×2
SPONGE LAP 4X18 X RAY DECT (DISPOSABLE) ×2 IMPLANT
SPONGE SURGIFOAM ABS GEL SZ50 (HEMOSTASIS) IMPLANT
STAPLER SKIN PROX WIDE 3.9 (STAPLE) ×2 IMPLANT
STRIP CLOSURE SKIN 1/2X4 (GAUZE/BANDAGES/DRESSINGS) IMPLANT
SUT MNCRL AB 4-0 PS2 18 (SUTURE) IMPLANT
SUT SILK 0 (SUTURE) ×1
SUT SILK 0 MO-6 18XCR BRD 8 (SUTURE) ×1 IMPLANT
SUT SILK 0 TIES 10X30 (SUTURE) IMPLANT
SUT SILK 2 0 TIES 10X30 (SUTURE) IMPLANT
SUT VIC AB 2-0 CP2 18 (SUTURE) ×4 IMPLANT
SYR EPIDURAL 5ML GLASS (SYRINGE) ×2 IMPLANT
SYRINGE 10CC LL (SYRINGE) IMPLANT
TOWEL OR 17X24 6PK STRL BLUE (TOWEL DISPOSABLE) ×2 IMPLANT
TOWEL OR 17X26 10 PK STRL BLUE (TOWEL DISPOSABLE) ×2 IMPLANT
WATER STERILE IRR 1000ML POUR (IV SOLUTION) ×2 IMPLANT
YANKAUER SUCT BULB TIP NO VENT (SUCTIONS) ×2 IMPLANT

## 2015-09-23 NOTE — Discharge Instructions (Signed)
Dr. Makhai Fulco Post-Op Orders ° °• Ice Pack - 20 minutes on (in a pillow case), and 20 minutes off. Wear the ice pack UNDER the binder. °• Follow up in office, they will call you for an appointment in 10 days to 2 weeks. °• Increase activity gradually.   °• No lifting anything heavier than a gallon of milk (10 pounds) until seen in the office. °• Advance diet slowly as tolerated. °• Dressing care:  Keep dressing dry for 3 days, and on Post-op day 4, may shower. °• Call for fever, drainage, and redness. °• No swimming or bathing in a bathtub (do not get into standing water). °•  °

## 2015-09-23 NOTE — Transfer of Care (Signed)
Immediate Anesthesia Transfer of Care Note  Patient: Christopher [name]  Procedure(s) Performed: Procedure(s) with comments: LUMBAR SPINAL CORD STIMULATOR INSERTION (N/A) - LUMBAR SPINAL CORD STIMULATOR INSERTION  Patient Location: PACU  Anesthesia Type:MAC  Level of Consciousness: awake, alert , oriented and patient cooperative  Airway & Oxygen Therapy: Patient Spontanous Breathing and Patient connected to nasal cannula oxygen  Post-op Assessment: Report given to RN and Post -op Vital signs reviewed and stable  Post vital signs: Reviewed and stable  Last Vitals:  Filed Vitals:   09/23/15 0906  BP:   Pulse:   Temp: 36.9 C  Resp:     Complications: No apparent anesthesia complications

## 2015-09-23 NOTE — Op Note (Signed)
PREOP DX: 1) lumbago  2) lumbar radiculopathy  3) lumbar post-laminectomy syndrome  4) chronic pain  POSTOP DX: 1) lumbago  2) lumbar radiculopathy  3) lumbar post-laminectomy syndrome  4) chronic pain  PROCEDURES PERFORMED:1) intraop fluoro 2) placement of 2 16 contact [employer] Infinion leads 3) placement of Spectra SCS generator  SURGEON:Thijs Brunton  ASSISTANT: NONE  ANESTHESIA: MAC  EBL: <20cc  DESCRIPTION OF PROCEDURE: After a discussion of risks, benefits and alternatives, informed consent was obtained. The patient was taken to the OR, turned prone onto a Jackson table, all pressure points padded, SCD's placed, and an adequate plane of anesthesia induced. A timeout was taken to verify the correct patient, position, personnel, availability of appropriate equipment, and administration of perioperative antibiotics.  The thoracic and lumbar areas were widely prepped with chloraprep and draped into a sterile field. Fluoroscopy was used to plan a left paramedian incision at the L1-L3 levels, and an incision made with a 10 blade and carried down to the dorsolumbar fascia with the bovie and blunt dissection. Retractors were placed and a 14g [employer] tuohy needle placed into the epidural space at the T12-L1 interspace using biplanar fluoro and loss-of-resistance technique. The needle was aspirated without any return of fluid. A [employer] INFINION lead was introduced and under live AP fluoro advanced until the distal-most contact overlay the midportion of the T7 vertebral body shadow with the rest of the contacts distributed over the T8-T10 vertebral bodies in a position just right of anatomic midline. A second Infinion lead was placed just left of anatomic midline in the same levels using the same technique. The patient was awakened and the leads tested; impedances were good, and the patient reported good coverage with amplitudes in the 3-7 mA range. 0 silk sutures were  placed in the fascia adjacent to the needles. The needles and stylets were removed under fluoroscopy with no lead migration noted. Leads were then fixed to the fascia with Clik anchors; repeat images were obtained to verify that there had been no lead migration.  The incision was inspected and hemostasis obtained with the bipolar cautery.  Attention was then turned to creation of a subcutaneous pocket. At the leftflank, a 3 cm incision was made with a 10 blade and using the bovie and blunt dissection a pocket of size appropriate to place a SCS generator. The pocket was trialed, and found to be of adequate size. The pocket was inspected for hemostasis, which was found to be excellent. Using reverse seldinger technique, the leads were tunneled to the pocket site, and the leads inserted into the SCS generator. Impedances were checked, and all found to be excellent. The leads were then all fixed into position with a self-torquing wrench. The wiring was all carefully coiled, placed behind the generator and placed in the pocket.  Both incisions were copiously irrigated with bacitracin-containing irrigation. The lumbar incision was closed in 2 deep layers of interrupted 2-0 vicryl and the skin closed with staples. The pocket incision was closed with a deeper layer of 2-0 vicryl interrupted sutures, and the skin closed with staples. Antibiotic ointment and sterile dressings were applied.   Needle, sponge, and instrument counts were correct x2 at the end of the case.    The patient was then carefully awakened from anesthesia, turned supine, an abdominal binder placed, and the patient taken to the recovery room where she underwent complex spinal cord stimulator programming.  COMPLICATIONS: NONE  CONDITION: Stable throughout the course of the procedure  and immediately afterward  DISPOSITION: discharge to home, with antibiotics and pain medicine. Discussed care with the patient and his significant other.  Followup in clinic will be scheduled in 10-14 days

## 2015-09-23 NOTE — Progress Notes (Signed)
Spoke w/ Dr.Fitzgerald-okay to d/c pt home at this time.

## 2015-09-23 NOTE — Anesthesia Preprocedure Evaluation (Addendum)
Anesthesia Evaluation  Patient identified by MRN, date of birth, ID band Patient awake    Reviewed: Allergy & Precautions, H&P , NPO status , Patient's Chart, lab work & pertinent test results  History of Anesthesia Complications (+) PONV  Airway Mallampati: II  TM Distance: >3 FB Neck ROM: Full    Dental no notable dental hx. (+) Edentulous Upper, Edentulous Lower, Dental Advisory Given   Pulmonary neg pulmonary ROS, former smoker,    Pulmonary exam normal breath sounds clear to auscultation       Cardiovascular negative cardio ROS   Rhythm:Regular Rate:Normal     Neuro/Psych  Headaches, negative psych ROS   GI/Hepatic Neg liver ROS, GERD  Medicated and Controlled,  Endo/Other  negative endocrine ROS  Renal/GU negative Renal ROS  negative genitourinary   Musculoskeletal  (+) Arthritis , Osteoarthritis,    Abdominal   Peds  Hematology negative hematology ROS (+)   Anesthesia Other Findings   Reproductive/Obstetrics negative OB ROS                           Anesthesia Physical Anesthesia Plan  ASA: II  Anesthesia Plan: MAC   Post-op Pain Management:    Induction: Intravenous  Airway Management Planned: Nasal Cannula and Natural Airway  Additional Equipment:   Intra-op Plan:   Post-operative Plan:   Informed Consent: I have reviewed the patients History and Physical, chart, labs and discussed the procedure including the risks, benefits and alternatives for the proposed anesthesia with the patient or authorized representative who has indicated his/her understanding and acceptance.   Dental advisory given  Plan Discussed with: CRNA  Anesthesia Plan Comments:        Anesthesia Quick Evaluation

## 2015-09-23 NOTE — Anesthesia Postprocedure Evaluation (Signed)
  Anesthesia Post-op Note  Patient: Christopher [name]  Procedure(s) Performed: Procedure(s) with comments: LUMBAR SPINAL CORD STIMULATOR INSERTION (N/A) - LUMBAR SPINAL CORD STIMULATOR INSERTION  Patient Location: PACU  Anesthesia Type: MAC  Level of Consciousness: awake and alert   Airway and Oxygen Therapy: Patient Spontanous Breathing  Post-op Pain: Controlled  Post-op Assessment: Post-op Vital signs reviewed, Patient's Cardiovascular Status Stable and Respiratory Function Stable  Post-op Vital Signs: Reviewed  Filed Vitals:   09/23/15 1006  BP:   Pulse:   Temp: 36.6 C  Resp:     Complications: No apparent anesthesia complications

## 2015-09-26 ENCOUNTER — Encounter (HOSPITAL_COMMUNITY): Payer: Self-pay | Admitting: Anesthesiology

## 2016-02-22 ENCOUNTER — Ambulatory Visit (HOSPITAL_COMMUNITY): Payer: Medicare Other | Admitting: Occupational Therapy

## 2016-03-07 ENCOUNTER — Encounter (HOSPITAL_COMMUNITY): Payer: Self-pay

## 2016-03-07 ENCOUNTER — Emergency Department (HOSPITAL_COMMUNITY)
Admission: EM | Admit: 2016-03-07 | Discharge: 2016-03-07 | Disposition: A | Discharge status: Home / Self Care | Payer: Medicare Other | Attending: Emergency Medicine | Admitting: Emergency Medicine

## 2016-03-07 ENCOUNTER — Emergency Department (HOSPITAL_COMMUNITY): Payer: Medicare Other

## 2016-03-07 DIAGNOSIS — M1990 Unspecified osteoarthritis, unspecified site: Secondary | ICD-10-CM | POA: Diagnosis not present

## 2016-03-07 DIAGNOSIS — R079 Chest pain, unspecified: Secondary | ICD-10-CM | POA: Diagnosis present

## 2016-03-07 DIAGNOSIS — Z87891 Personal history of nicotine dependence: Secondary | ICD-10-CM | POA: Diagnosis not present

## 2016-03-07 DIAGNOSIS — R42 Dizziness and giddiness: Secondary | ICD-10-CM | POA: Diagnosis not present

## 2016-03-07 DIAGNOSIS — M549 Dorsalgia, unspecified: Secondary | ICD-10-CM | POA: Diagnosis not present

## 2016-03-07 DIAGNOSIS — J189 Pneumonia, unspecified organism: Secondary | ICD-10-CM | POA: Insufficient documentation

## 2016-03-07 DIAGNOSIS — Z79899 Other long term (current) drug therapy: Secondary | ICD-10-CM | POA: Insufficient documentation

## 2016-03-07 DIAGNOSIS — Z7982 Long term (current) use of aspirin: Secondary | ICD-10-CM | POA: Diagnosis not present

## 2016-03-07 LAB — BASIC METABOLIC PANEL
Anion gap: 7 (ref 5–15)
BUN: 14 mg/dL (ref 6–20)
CO2: 26 mmol/L (ref 22–32)
Calcium: 8.9 mg/dL (ref 8.9–10.3)
Chloride: 106 mmol/L (ref 101–111)
Creatinine, Ser: 0.7 mg/dL (ref 0.61–1.24)
GFR calc Af Amer: >60 mL/min (ref >60)
GFR calc non Af Amer: >60 mL/min (ref >60)
Glucose, Bld: 114 mg/dL — ABNORMAL HIGH (ref 65–99)
Potassium: 3.9 mmol/L (ref 3.5–5.1)
Sodium: 139 mmol/L (ref 135–145)

## 2016-03-07 LAB — CBC WITH DIFFERENTIAL/PLATELET
Basophils Absolute: 0 10*3/uL (ref 0.0–0.1)
Basophils Relative: 1 %
Eosinophils Absolute: 0.2 10*3/uL (ref 0.0–0.7)
Eosinophils Relative: 3 %
HCT: 40.7 % (ref 39.0–52.0)
Hemoglobin: 13.8 g/dL (ref 13.0–17.0)
Lymphocytes Relative: 37 %
Lymphs Abs: 1.7 10*3/uL (ref 0.7–4.0)
MCH: 31.2 pg (ref 26.0–34.0)
MCHC: 33.9 g/dL (ref 30.0–36.0)
MCV: 92.1 fL (ref 78.0–100.0)
Monocytes Absolute: 0.4 10*3/uL (ref 0.1–1.0)
Monocytes Relative: 9 %
Neutro Abs: 2.3 10*3/uL (ref 1.7–7.7)
Neutrophils Relative %: 50 %
Platelets: 250 10*3/uL (ref 150–400)
RBC: 4.42 MIL/uL (ref 4.22–5.81)
RDW: 12.9 % (ref 11.5–15.5)
WBC: 4.5 10*3/uL (ref 4.0–10.5)

## 2016-03-07 LAB — TROPONIN I: Troponin I: <0.03 ng/mL (ref <0.031)

## 2016-03-07 MED ORDER — CEFTRIAXONE SODIUM 1 G IJ SOLR
1.0000 g | Freq: Once | INTRAMUSCULAR | Status: AC
  Administered 2016-03-07: 1 g via INTRAVENOUS
  Filled 2016-03-07: qty 10

## 2016-03-07 MED ORDER — SODIUM CHLORIDE 0.9 % IV SOLN
INTRAVENOUS | Status: DC
  Administered 2016-03-07: 15:00:00 via INTRAVENOUS

## 2016-03-07 MED ORDER — IOHEXOL 350 MG/ML SOLN
100.0000 mL | Freq: Once | INTRAVENOUS | Status: AC | PRN
  Administered 2016-03-07: 100 mL via INTRAVENOUS

## 2016-03-07 MED ORDER — HYDROCODONE-ACETAMINOPHEN 5-325 MG PO TABS: 1.0000 | ORAL_TABLET | Freq: Four times a day (QID) | ORAL | Status: DC | PRN

## 2016-03-07 MED ORDER — LEVOFLOXACIN 750 MG PO TABS: 750.0000 mg | ORAL_TABLET | Freq: Every day | ORAL | Status: DC

## 2016-03-07 MED ORDER — SODIUM CHLORIDE 0.9 % IV SOLN: INTRAVENOUS | Status: DC

## 2016-03-07 NOTE — ED Notes (Signed)
Pt heart rate dropped to 41. Pt was asleep at this time. Once awake, pt heart rate 50.

## 2016-03-07 NOTE — ED Notes (Signed)
MD at bedside. 

## 2016-03-07 NOTE — ED Notes (Signed)
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  Pt says chest started hurting around the same time so he thought it was from coughing so much.  Pt says cough has stopped but chest pain has been getting worse.  Pt says pain is in r side of chest radiating through to r shoulder blade.  Denies sob.  Reports some dizziness.

## 2016-03-07 NOTE — ED Provider Notes (Addendum)
CSN: [id number]     Arrival date & time 03/07/16  [id number] History  By signing my name below, I, Dora Sims, attest that this documentation has been prepared under the direction and in the presence of physician practitioner, Fredia Sorrow, MD,. Electronically Signed: Dora Sims, Scribe. 03/07/2016. 11:35 AM.    Chief Complaint  Patient presents with  . Chest Pain    Patient is a 70 y.o. male presenting with chest pain. The history is provided by the patient. No language interpreter was used.  Chest Pain Pain location:  R chest Pain radiates to:  Upper back and R shoulder Pain radiates to the back: yes   Pain severity:  Severe Onset quality:  Sudden Duration:  5 days Timing:  Constant Progression:  Unable to specify Chronicity:  New Context: breathing and raising an arm (right)   Relieved by:  None tried Ineffective treatments:  None tried Associated symptoms: back pain and dizziness   Associated symptoms: no abdominal pain, no cough, no fever, no headache, no nausea, no shortness of breath and not vomiting      HPI Comments: Christopher [name] is a 70 y.o. male with h/o GERD, arthritis, and chronic back pain who presents to the Emergency Department complaining of sudden onset, waxing and waning, right chest pain for the past 5 days. Pt also endorses that his chest pain radiates into back right shoulder blade. He notes that he experienced severe, 8/10 chest pain this morning; he was woken up around 630AM due to his chest pain. He notes that his chest pain is currently 4/10. Pt endorses chest pain exacerbation with right arm movement. He denies chest pain exacerbation with deep inhalation. He denies nausea, vomiting, or SOB.  PCP: Dr. Cindie Laroche  Past Medical History  Diagnosis Date  . H/O hiatal hernia   . Arthritis   . PONV (postoperative nausea and vomiting)   . BPH (benign prostatic hyperplasia)     takes Proscar daily  . Insomnia     takes Xanax nightly  . GERD  (gastroesophageal reflux disease)     takes Nexium daily  . Constipation     takes Colace daily  . Muscle spasm of back     takes Valium daily as needed  . Headache(784.0)     occasionally  . Weakness     numbness and tingling  . Peripheral neuropathy (HCC)     takes Gabapentin daily  . Joint pain   . Joint swelling   . Chronic back pain     DDD  . History of gastric ulcer   . History of colon polyps     benign  . Macular degeneration    Past Surgical History  Procedure Laterality Date  . Neck surgery      x 3  . Rt shoulder    . Appendectomy    . Colonoscopy with esophagogastroduodenoscopy (egd) N/A 01/22/2013    Procedure: COLONOSCOPY WITH ESOPHAGOGASTRODUODENOSCOPY (EGD);  Surgeon: [name], MD;  Location: [facility];  Service: Endoscopy;  Laterality: N/A;  140  . Carpal tunnel release Bilateral   . Cataract extraction w/phaco Right 05/24/2014    Procedure: CATARACT EXTRACTION PHACO AND INTRAOCULAR LENS PLACEMENT (IOC);  Surgeon: [name], MD;  Location: [facility];  Service: Ophthalmology;  Laterality: Right;  CDE 6.74  . Cataract extraction w/phaco Left 06/17/2014    Procedure: CATARACT EXTRACTION PHACO AND INTRAOCULAR LENS PLACEMENT (IOC);  Surgeon: [name], MD;  Location: [facility];  Service: Ophthalmology;  Laterality: Left;  CDE:4.65  . Back surgery      x 4-with fusion being one of those times  . Spinal cord stimulator insertion N/A 09/23/2015    Procedure: LUMBAR SPINAL CORD STIMULATOR INSERTION;  Surgeon: [name], MD;  Location: [place] [facility];  Service: Neurosurgery;  Laterality: N/A;  LUMBAR SPINAL CORD STIMULATOR INSERTION   No family history on file. Social History  Substance Use Topics  . Smoking status: Former Research scientist (life sciences)  . Smokeless tobacco: None     Comment: quit smoking 82yrs ago  . Alcohol Use: Yes     Comment: 5-6 beers nightly    Review of Systems  Constitutional: Negative for fever and chills.  HENT: Negative for rhinorrhea and sore throat.    Eyes: Negative for visual disturbance.  Respiratory: Negative for cough and shortness of breath.   Cardiovascular: Positive for chest pain.  Gastrointestinal: Negative for nausea, vomiting, abdominal pain and diarrhea.  Genitourinary: Negative for dysuria.       Negative for hematuria  Musculoskeletal: Positive for back pain. Negative for joint swelling.  Skin: Negative for rash.  Neurological: Positive for dizziness. Negative for headaches.  Hematological: Does not bruise/bleed easily.      Allergies  Morphine and related  Home Medications   Prior to Admission medications   Medication Sig Start Date End Date Taking? Authorizing Provider  alfuzosin (UROXATRAL) 10 MG 24 hr tablet Take 10 mg by mouth daily with breakfast.   Yes Historical Provider, MD  ALPRAZolam (XANAX) 1 MG tablet Take 1 mg by mouth at bedtime.    Yes Historical Provider, MD  aspirin EC 81 MG tablet Take 81 mg by mouth daily.   Yes Historical Provider, MD  Cholecalciferol (VITAMIN D PO) Take 1 capsule by mouth daily.   Yes Historical Provider, MD  docusate sodium (COLACE) 100 MG capsule Take 200 mg by mouth daily.   Yes Historical Provider, MD  esomeprazole (NEXIUM) 40 MG capsule Take 40 mg by mouth daily.    Yes Historical Provider, MD  finasteride (PROSCAR) 5 MG tablet Take 5 mg by mouth daily.   Yes Historical Provider, MD  gabapentin (NEURONTIN) 400 MG capsule Take 400 mg by mouth 2 (two) times daily.    Yes Historical Provider, MD  Ginkgo Biloba (GNP GINGKO BILOBA EXTRACT PO) Take 1 tablet by mouth daily.   Yes Historical Provider, MD  HYDROcodone-acetaminophen (NORCO) 10-325 MG per tablet Take 1 tablet by mouth every 6 (six) hours as needed for moderate pain.   Yes Historical Provider, MD  meloxicam (MOBIC) 15 MG tablet Take 15 mg by mouth daily. 08/30/15  Yes Historical Provider, MD  Misc Natural Products (GLUCOSAMINE CHONDROITIN VIT D3 PO) Take 1 tablet by mouth daily.   Yes Historical Provider, MD  naproxen  sodium (ANAPROX) 220 MG tablet Take 440 mg by mouth daily as needed (back pain).   Yes Historical Provider, MD  Omega-3 Fatty Acids (FISH OIL) 1200 MG CAPS Take 1,200 mg by mouth daily.   Yes Historical Provider, MD  cephALEXin (KEFLEX) 500 MG capsule Take 1 capsule (500 mg total) by mouth 4 (four) times daily. Patient not taking: Reported on 03/07/2016 09/23/15   [name], MD  HYDROcodone-acetaminophen (NORCO/VICODIN) 5-325 MG tablet Take 1-2 tablets by mouth every 6 (six) hours as needed. 03/07/16   Fredia Sorrow, MD  levofloxacin (LEVAQUIN) 750 MG tablet Take 1 tablet (750 mg total) by mouth daily. 03/07/16   Fredia Sorrow, MD  oxyCODONE-acetaminophen (PERCOCET) 10-325 MG tablet  Take 1 tablet by mouth every 4 (four) hours as needed for pain (severe post-surgical pain not addressed with hydrocodone). Patient not taking: Reported on 03/07/2016 09/23/15   [name], MD   BP 136/70 mmHg  Pulse 59  Temp(Src) 97.6 F (36.4 C) (Oral)  Resp 20  SpO2 97% Physical Exam  Constitutional: He is oriented to person, place, and time. He appears well-developed and well-nourished. No distress.  HENT:  Head: Normocephalic and atraumatic.  Mouth/Throat: Oropharynx is clear and moist and mucous membranes are normal.  Eyes: Conjunctivae and EOM are normal. Pupils are equal, round, and reactive to light.  Neck: Neck supple. No tracheal deviation present.  Cardiovascular: Normal rate, regular rhythm and normal heart sounds.   Pulmonary/Chest: Effort normal and breath sounds normal. No respiratory distress.  Abdominal: Bowel sounds are normal. There is no tenderness.  Musculoskeletal: Normal range of motion. He exhibits no edema.  No bilateral ankle swelling  Neurological: He is alert and oriented to person, place, and time. He has normal reflexes.  Skin: Skin is warm and dry.  Psychiatric: He has a normal mood and affect. His behavior is normal.  Nursing note and vitals reviewed.   ED Course   Procedures (including critical care time)  DIAGNOSTIC STUDIES: Oxygen Saturation is 96% on RA, adequate by my interpretation.    COORDINATION OF CARE: 11:38 AM Will order CT Angio Chest PE. Will order DG Chest 2 View. Will order blood work. Will order ED EKG and cardiac monitoring. Discussed treatment plan with pt at bedside and pt agreed to plan.  Medications  0.9 %  sodium chloride infusion ( Intravenous Stopped 03/07/16 1537)  iohexol (OMNIPAQUE) 350 MG/ML injection 100 mL (100 mLs Intravenous Contrast Given 03/07/16 1203)  cefTRIAXone (ROCEPHIN) 1 g in dextrose 5 % 50 mL IVPB (0 g Intravenous Stopped 03/07/16 1537)    Results for orders placed or performed during the hospital encounter of 03/07/16  CBC with Differential  Result Value Ref Range   WBC 4.5 4.0 - 10.5 K/uL   RBC 4.42 4.22 - 5.81 MIL/uL   Hemoglobin 13.8 13.0 - 17.0 g/dL   HCT 40.7 39.0 - 52.0 %   MCV 92.1 78.0 - 100.0 fL   MCH 31.2 26.0 - 34.0 pg   MCHC 33.9 30.0 - 36.0 g/dL   RDW 12.9 11.5 - 15.5 %   Platelets 250 150 - 400 K/uL   Neutrophils Relative % 50 %   Neutro Abs 2.3 1.7 - 7.7 K/uL   Lymphocytes Relative 37 %   Lymphs Abs 1.7 0.7 - 4.0 K/uL   Monocytes Relative 9 %   Monocytes Absolute 0.4 0.1 - 1.0 K/uL   Eosinophils Relative 3 %   Eosinophils Absolute 0.2 0.0 - 0.7 K/uL   Basophils Relative 1 %   Basophils Absolute 0.0 0.0 - 0.1 K/uL  Basic metabolic panel  Result Value Ref Range   Sodium 139 135 - 145 mmol/L   Potassium 3.9 3.5 - 5.1 mmol/L   Chloride 106 101 - 111 mmol/L   CO2 26 22 - 32 mmol/L   Glucose, Bld 114 (H) 65 - 99 mg/dL   BUN 14 6 - 20 mg/dL   Creatinine, Ser 0.70 0.61 - 1.24 mg/dL   Calcium 8.9 8.9 - 10.3 mg/dL   GFR calc non Af Amer >60 >60 mL/min   GFR calc Af Amer >60 >60 mL/min   Anion gap 7 5 - 15  Troponin I  Result Value Ref Range  Troponin I <0.03 <0.031 ng/mL   Dg Chest 2 View  03/07/2016  CLINICAL DATA:  Chest pain, coughing, right side chest and shoulder pain  EXAM: CHEST  2 VIEW COMPARISON:  12/15/2014 FINDINGS: Cardiomediastinal silhouette is stable. No infiltrate or pleural effusion. No pulmonary edema. Spinal stimulator wires mid thoracic spine again noted. Mild degenerative changes lower thoracic and lumbar spine. IMPRESSION: No active disease. Mild degenerative changes lower thoracic and lumbar spine. Electronically Signed   By: Lahoma Crocker M.D.   On: 03/07/2016 09:41   Ct Angio Chest Pe W/cm &/or Wo Cm  03/07/2016  CLINICAL DATA:  Right-sided chest pain for 5 days radiating back. EXAM: CT ANGIOGRAPHY CHEST WITH CONTRAST TECHNIQUE: Multidetector CT imaging of the chest was performed using the standard protocol during bolus administration of intravenous contrast. Multiplanar CT image reconstructions and MIPs were obtained to evaluate the vascular anatomy. CONTRAST:  180mL OMNIPAQUE IOHEXOL 350 MG/ML SOLN COMPARISON:  None. FINDINGS: There are no filling defects in the pulmonary arterial tree to suggest acute pulmonary thromboembolism No evidence of aortic dissection or aneurysm. There are scattered small mediastinal nodes. The right hilar node is 7 mm in short axis diameter on sagittal reconstruction imaging. No pneumothorax or pleural effusion Dependent atelectasis bilaterally. There is more confluent patchy opacity at the posterior right costophrenic angle of the right lower lobe on image 74. Spinal stimulator is in the posterior spinal canal. It extends from T7 to T10. No acute bony deformity. Review of the MIP images confirms the above findings. IMPRESSION: No evidence of acute pulmonary thromboembolism Patchy pulmonary opacity in the posterior basal segment of the right lower lobe. This is most likely related to an inflammatory process. Three-month follow-up CT is recommended to ensure resolution. Electronically Signed   By: Marybelle Killings M.D.   On: 03/07/2016 12:20       MDM   Final diagnoses:  CAP (community acquired pneumonia)   Chest x-ray  negative for pneumonia CT angiogram rule out raises concerns for an opacity right lower lobe which would be consistent with patient's pain is. We treated as a community acquired pneumonia. Patient has not been in the hospital recently. Patient understands that follow-up would be important to make sure that this resolves and may require follow-up CT scan in 3 months to make sure his resolution. Patient will follow-up with his primary care doctor. Patient received 1 g Rocephin here will be continued with Levaquin at home. Patient also has hydrocodone for pain as needed. Patient return for any new or worse symptoms. Patient's cardiac workup was negative troponin was negative no significant EKG changes. EKG shows no evidence of any significant changes compared October 2016.  I personally performed the services described in this documentation, which was scribed in my presence. The recorded information has been reviewed and is accurate.       Fredia Sorrow, MD 03/07/16 [place], MD 03/07/16 1539

## 2016-03-07 NOTE — Discharge Instructions (Signed)
Community-Acquired Pneumonia, Adult Pneumonia is an infection of the lungs. One type of pneumonia can happen while a person is in a hospital. A different type can happen when a person is not in a hospital (community-acquired pneumonia). It is easy for this kind to spread from person to person. It can spread to you if you breathe near an infected person who coughs or sneezes. Some symptoms include:  A dry cough.  A wet (productive) cough.  Fever.  Sweating.  Chest pain. HOME CARE  Take over-the-counter and prescription medicines only as told by your doctor.  Only take cough medicine if you are losing sleep.  If you were prescribed an antibiotic medicine, take it as told by your doctor. Do not stop taking the antibiotic even if you start to feel better.  Sleep with your head and neck raised (elevated). You can do this by putting a few pillows under your head, or you can sleep in a recliner.  Do not use tobacco products. These include cigarettes, chewing tobacco, and e-cigarettes. If you need help quitting, ask your doctor.  Drink enough water to keep your pee (urine) clear or pale yellow. A shot (vaccine) can help prevent pneumonia. Shots are often suggested for:  People older than 71 years of age.  People older than 71 years of age:  Who are having cancer treatment.  Who have long-term (chronic) lung disease.  Who have problems with their body's defense system (immune system). You may also prevent pneumonia if you take these actions:  Get the flu (influenza) shot every year.  Go to the dentist as often as told.  Wash your hands often. If soap and water are not available, use hand sanitizer. GET HELP IF:  You have a fever.  You lose sleep because your cough medicine does not help. GET HELP RIGHT AWAY IF:  You are short of breath and it gets worse.  You have more chest pain.  Your sickness gets worse. This is very serious if:  You are an older adult.  Your  body's defense system is weak.  You cough up blood.   This information is not intended to replace advice given to you by your health care provider. Make sure you discuss any questions you have with your health care provider.   Document Released: 05/21/2008 Document Revised: 08/24/2015 Document Reviewed: 03/30/2015 Elsevier Interactive Patient Education 2016 [employer]  Right-sided chest consistent with pneumonia. Take antibiotics as directed. May complement follow-up with your regular doctor to make sure that this clears up. Always possibility can be a lung tumor behind there. Recommend follow-up sometime after 2 weeks. Return for any new or worse symptoms. Would expect to be improving in 2 days.

## 2016-08-31 ENCOUNTER — Ambulatory Visit: Payer: Medicare Other | Attending: Neurology | Admitting: Neurology

## 2016-08-31 DIAGNOSIS — G4733 Obstructive sleep apnea (adult) (pediatric): Secondary | ICD-10-CM | POA: Insufficient documentation

## 2016-09-06 NOTE — Procedures (Signed)
[place] A. Merlene Laughter, MD     [email or web address]             NOCTURNAL POLYSOMNOGRAPHY   LOCATION: [facility]   Patient Name: Christopher [name], Christopher [name] Date: 08/31/2016 Gender: Male D.O.B: 1946-12-09 Age (years): 31 Referring Provider: Phillips Odor MD, ABSM Height (inches): 67 Interpreting Physician: Phillips Odor MD, ABSM Weight (lbs): 152 RPSGT: Peak, Robert BMI: 24 MRN: [id number] Neck Size: 16.00 CLINICAL INFORMATION Sleep Study Type: NPSG Indication for sleep study: N/A Epworth Sleepiness Score: 20 SLEEP STUDY TECHNIQUE As per the AASM Manual for the Scoring of Sleep and Associated Events v2.3 (April 2016) with a hypopnea requiring 4% desaturations. The channels recorded and monitored were frontal, central and occipital EEG, electrooculogram (EOG), submentalis EMG (chin), nasal and oral airflow, thoracic and abdominal wall motion, anterior tibialis EMG, snore microphone, electrocardiogram, and pulse oximetry. MEDICATIONS Patient's medications include: N/A. Medications self-administered by patient during sleep study : No sleep medicine administered.  Current Outpatient Prescriptions:  .  alfuzosin (UROXATRAL) 10 MG 24 hr tablet, Take 10 mg by mouth daily with breakfast., Disp: , Rfl:  .  ALPRAZolam (XANAX) 1 MG tablet, Take 1 mg by mouth at bedtime. , Disp: , Rfl:  .  aspirin EC 81 MG tablet, Take 81 mg by mouth daily., Disp: , Rfl:  .  cephALEXin (KEFLEX) 500 MG capsule, Take 1 capsule (500 mg total) by mouth 4 (four) times daily. (Patient not taking: Reported on 03/07/2016), Disp: 20 capsule, Rfl: 0 .  Cholecalciferol (VITAMIN D PO), Take 1 capsule by mouth daily., Disp: , Rfl:  .  docusate sodium (COLACE) 100 MG capsule, Take 200 mg by mouth daily., Disp: , Rfl:  .  esomeprazole (NEXIUM) 40 MG capsule, Take 40 mg by mouth daily. , Disp: , Rfl:  .  finasteride (PROSCAR) 5 MG tablet, Take 5 mg by mouth daily., Disp: , Rfl:  .  gabapentin (NEURONTIN)  400 MG capsule, Take 400 mg by mouth 2 (two) times daily. , Disp: , Rfl:  .  Ginkgo Biloba (GNP GINGKO BILOBA EXTRACT PO), Take 1 tablet by mouth daily., Disp: , Rfl:  .  HYDROcodone-acetaminophen (NORCO) 10-325 MG per tablet, Take 1 tablet by mouth every 6 (six) hours as needed for moderate pain., Disp: , Rfl:  .  HYDROcodone-acetaminophen (NORCO/VICODIN) 5-325 MG tablet, Take 1-2 tablets by mouth every 6 (six) hours as needed., Disp: 10 tablet, Rfl: 0 .  levofloxacin (LEVAQUIN) 750 MG tablet, Take 1 tablet (750 mg total) by mouth daily., Disp: 7 tablet, Rfl: 0 .  meloxicam (MOBIC) 15 MG tablet, Take 15 mg by mouth daily., Disp: , Rfl: 2 .  Misc Natural Products (GLUCOSAMINE CHONDROITIN VIT D3 PO), Take 1 tablet by mouth daily., Disp: , Rfl:  .  naproxen sodium (ANAPROX) 220 MG tablet, Take 440 mg by mouth daily as needed (back pain)., Disp: , Rfl:  .  Omega-3 Fatty Acids (FISH OIL) 1200 MG CAPS, Take 1,200 mg by mouth daily., Disp: , Rfl:  .  oxyCODONE-acetaminophen (PERCOCET) 10-325 MG tablet, Take 1 tablet by mouth every 4 (four) hours as needed for pain (severe post-surgical pain not addressed with hydrocodone). (Patient not taking: Reported on 03/07/2016), Disp: 50 tablet, Rfl: 0  SLEEP ARCHITECTURE The study was initiated at 10:42:25 PM and ended at 4:57:57 AM. Sleep onset time was 44.2 minutes and the sleep efficiency was 81.9%. The total sleep time was 307.5 minutes. Stage REM latency was 55.0 minutes. The patient spent 9.43% of the  night in stage N1 sleep, 68.62% in stage N2 sleep, 0.81% in stage N3 and 21.14% in REM. Alpha intrusion was absent. Supine sleep was 4.68%. RESPIRATORY PARAMETERS The overall apnea/hypopnea index (AHI) was 2.0 per hour. There were 2 total apneas, including 2 obstructive, 0 central and 0 mixed apneas. There were 8 hypopneas and 0 RERAs. The AHI during Stage REM sleep was 0.9 per hour. AHI while supine was 37.6 per hour. The mean oxygen saturation was 93.24%.  The minimum SpO2 during sleep was 85.00%. Moderate snoring was noted during this study. CARDIAC DATA The 2 lead EKG demonstrated sinus rhythm. The mean heart rate was 54.57 beats per minute. Other EKG findings include: None. LEG MOVEMENT DATA The total PLMS were 0 with a resulting PLMS index of 0.00. Associated arousal with leg movement index was 0.0.   IMPRESSIONS - No significant obstructive sleep apnea occurred during this study. - No significant central sleep apnea occurred during this study.    Delano Metz, MD Diplomate, American Board of Sleep Medicine.

## 2017-02-21 ENCOUNTER — Other Ambulatory Visit (HOSPITAL_COMMUNITY): Payer: Self-pay | Admitting: Family Medicine

## 2017-02-21 ENCOUNTER — Ambulatory Visit (HOSPITAL_COMMUNITY)
Admission: RE | Admit: 2017-02-21 | Discharge: 2017-02-21 | Disposition: A | Discharge status: Home / Self Care | Payer: Medicare Other | Source: Ambulatory Visit | Attending: Family Medicine | Admitting: Family Medicine

## 2017-02-21 DIAGNOSIS — R109 Unspecified abdominal pain: Secondary | ICD-10-CM

## 2017-02-21 DIAGNOSIS — N329 Bladder disorder, unspecified: Secondary | ICD-10-CM | POA: Diagnosis not present

## 2017-02-21 DIAGNOSIS — K4090 Unilateral inguinal hernia, without obstruction or gangrene, not specified as recurrent: Secondary | ICD-10-CM | POA: Insufficient documentation

## 2017-02-21 DIAGNOSIS — K829 Disease of gallbladder, unspecified: Secondary | ICD-10-CM | POA: Diagnosis not present

## 2017-02-21 DIAGNOSIS — Z9049 Acquired absence of other specified parts of digestive tract: Secondary | ICD-10-CM | POA: Diagnosis not present

## 2017-02-21 DIAGNOSIS — N400 Enlarged prostate without lower urinary tract symptoms: Secondary | ICD-10-CM | POA: Insufficient documentation

## 2017-05-14 DIAGNOSIS — M25561 Pain in right knee: Secondary | ICD-10-CM

## 2017-05-14 DIAGNOSIS — G8929 Other chronic pain: Secondary | ICD-10-CM | POA: Insufficient documentation

## 2017-05-27 ENCOUNTER — Other Ambulatory Visit: Payer: Self-pay | Admitting: Orthopedic Surgery

## 2017-06-06 NOTE — Pre-Procedure Instructions (Signed)
    Christopher [name]  06/06/2017       Your procedure is scheduled on Monday, July 7.  Report to [facility] Admitting at 7:15 AM                Your surgery or procedure is scheduled for 9:15 AM   Call this number if you have problems the morning of surgery: [phone]                For any other questions, please call [phone], Monday - Friday 8 AM - 4 PM.    Remember:  Do not eat food or drink liquids after midnight Sunday, July 1.  Take these medicines the morning of surgery with A SIP OF WATER :esomeprazole ([place]), finasteride (PROSCAR), gabapentin (NEURONTIN), tamsulosin (FLOMAX).    Do not wear jewelry, make-up or nail polish.  Do not wear lotions, powders, or perfumes, or deoderant.  Do not shave 48 hours prior to surgery.  Men may shave face and neck.  Do not bring valuables to the hospital.  [facility] is not responsible for any belongings or valuables.  Contacts, dentures or bridgework may not be worn into surgery.  Leave your suitcase in the car.  After surgery it may be brought to your room.  For patients admitted to the hospital, discharge time will be determined by your treatment team.  Patients discharged the day of surgery will not be allowed to drive home.    Please read over the following fact sheets that you were given: Pain Booklet, Patient Instructions for Mupirocin Application, Incentive Spirometry, Surgical Site Infections.

## 2017-06-06 NOTE — Pre-Procedure Instructions (Signed)
Christopher [name]  06/06/2017       Your procedure is scheduled on Monday, July 7.  Report to [facility] Admitting at 7:15 AM                Your surgery or procedure is scheduled for 9:15 AM   Call this number if you have problems the morning of surgery: [phone]                For any other questions, please call [phone], Monday - Friday 8 AM - 4 PM.    Remember:  Do not eat food or drink liquids after midnight Sunday, July 1.  Take these medicines the morning of surgery with A SIP OF WATER :esomeprazole ([place]), finasteride (PROSCAR), gabapentin (NEURONTIN), tamsulosin (FLOMAX), alfuzosin (UROXATRAL).                  May take HYDROcodone-acetaminophen [facility]).               1 Week prior to surgery STOP taking Aspirin, Aspirin Products (Goody Powder, Excedrin Migraine), Ibuprofen (Advil), Naproxen (Aleve), Vitamins and Herbal Products (ie Fish Oil),Ginkgo Biloba, meloxicam (MOBIC), naproxen sodium (ANAPROX).  Special instructions:  - Preparing For Surgery  Before surgery, you can play an important role. Because skin is not sterile, your skin needs to be as free of germs as possible. You can reduce the number of germs on your skin by washing with CHG (chlorahexidine gluconate) Soap before surgery.  CHG is an antiseptic cleaner which kills germs and bonds with the skin to continue killing germs even after washing.  Please do not use if you have an allergy to CHG or antibacterial soaps. If your skin becomes reddened/irritated stop using the CHG.  Do not shave (including legs and underarms) for at least 48 hours prior to first CHG shower. It is OK to shave your face.  Please follow these instructions carefully.   1. Shower the NIGHT BEFORE SURGERY and the MORNING OF SURGERY with CHG.   2. If you chose to wash your hair, wash your hair first as usual with your normal shampoo.  3. After you shampoo, rinse your hair and body thoroughly to remove the  shampoo. Wash face and private area with your normal soap, rinse. 4. Use CHG as you would any other liquid soap. You can apply CHG directly to the skin and wash gently with a scrungie or a clean washcloth.   5. Apply the CHG Soap to your body ONLY FROM THE NECK DOWN.  Do not use on open wounds or open sores. Avoid contact with your eyes, ears, mouth and genitals (private parts). Wash genitals (private parts) with your normal soap.  6. Wash thoroughly, paying special attention to the area where your surgery will be performed.  7. Thoroughly rinse your body with warm water from the neck down.  8. DO NOT shower/wash with your normal soap after using and rinsing off the CHG Soap.  9. Pat yourself dry with a CLEAN TOWEL.   10. Wear CLEAN PAJAMAS   11. Place CLEAN SHEETS on your bed the night of your first shower and DO NOT SLEEP WITH PETS.  Day of Surgery: Shower as above. Do not apply any deodorants/lotions,, powders, or colognes . Please wear clean clothes to the hospital/surgery center.    Do not wear jewelry, make-up or nail polish.  Do not shave 48 hours prior to surgery.  Men may shave face and  neck.  Do not bring valuables to the hospital.  [facility] is not responsible for any belongings or valuables.  Contacts, dentures or bridgework may not be worn into surgery.  Leave your suitcase in the car.  After surgery it may be brought to your room.  For patients admitted to the hospital, discharge time will be determined by your treatment team.  Patients discharged the day of surgery will not be allowed to drive home.   Please read over the following fact sheets that you were given: Pain Booklet, Patient Instructions for Mupirocin Application, Incentive Spirometry, Surgical Site Infections.

## 2017-06-07 ENCOUNTER — Encounter (HOSPITAL_COMMUNITY): Payer: Self-pay

## 2017-06-07 ENCOUNTER — Encounter (HOSPITAL_COMMUNITY)
Admission: RE | Admit: 2017-06-07 | Discharge: 2017-06-07 | Disposition: A | Discharge status: Home / Self Care | Payer: Medicare Other | Source: Ambulatory Visit | Attending: Orthopedic Surgery | Admitting: Orthopedic Surgery

## 2017-06-07 DIAGNOSIS — Z01812 Encounter for preprocedural laboratory examination: Secondary | ICD-10-CM | POA: Insufficient documentation

## 2017-06-07 HISTORY — DX: Other cervical disc degeneration, unspecified cervical region: M50.30

## 2017-06-07 LAB — CBC WITH DIFFERENTIAL/PLATELET
Basophils Absolute: 0 10*3/uL (ref 0.0–0.1)
Basophils Relative: 1 %
Eosinophils Absolute: 0.1 10*3/uL (ref 0.0–0.7)
Eosinophils Relative: 2 %
HCT: 43.8 % (ref 39.0–52.0)
Hemoglobin: 14.5 g/dL (ref 13.0–17.0)
Lymphocytes Relative: 38 %
Lymphs Abs: 1.8 10*3/uL (ref 0.7–4.0)
MCH: 30.8 pg (ref 26.0–34.0)
MCHC: 33.1 g/dL (ref 30.0–36.0)
MCV: 93 fL (ref 78.0–100.0)
Monocytes Absolute: 0.3 10*3/uL (ref 0.1–1.0)
Monocytes Relative: 7 %
Neutro Abs: 2.5 10*3/uL (ref 1.7–7.7)
Neutrophils Relative %: 52 %
Platelets: 220 10*3/uL (ref 150–400)
RBC: 4.71 MIL/uL (ref 4.22–5.81)
RDW: 13.1 % (ref 11.5–15.5)
WBC: 4.8 10*3/uL (ref 4.0–10.5)

## 2017-06-07 LAB — SURGICAL PCR SCREEN
MRSA, PCR: NEGATIVE
Staphylococcus aureus: NEGATIVE

## 2017-06-07 LAB — COMPREHENSIVE METABOLIC PANEL
ALT: 18 U/L (ref 17–63)
AST: 24 U/L (ref 15–41)
Albumin: 4.2 g/dL (ref 3.5–5.0)
Alkaline Phosphatase: 63 U/L (ref 38–126)
Anion gap: 10 (ref 5–15)
BUN: 19 mg/dL (ref 6–20)
CO2: 23 mmol/L (ref 22–32)
Calcium: 9.3 mg/dL (ref 8.9–10.3)
Chloride: 105 mmol/L (ref 101–111)
Creatinine, Ser: 0.69 mg/dL (ref 0.61–1.24)
GFR calc Af Amer: >60 mL/min (ref >60)
GFR calc non Af Amer: >60 mL/min (ref >60)
Glucose, Bld: 121 mg/dL — ABNORMAL HIGH (ref 65–99)
Potassium: 3.7 mmol/L (ref 3.5–5.1)
Sodium: 138 mmol/L (ref 135–145)
Total Bilirubin: 0.7 mg/dL (ref 0.3–1.2)
Total Protein: 6.6 g/dL (ref 6.5–8.1)

## 2017-06-07 NOTE — Progress Notes (Signed)
Ms Christopher [name] reports she has not had any chest pain since she was in hospital in February 2018.  Patient does chest short of breath with exertion.

## 2017-06-14 MED ORDER — GABAPENTIN 300 MG PO CAPS
300.0000 mg | ORAL_CAPSULE | Freq: Once | ORAL | Status: AC
  Administered 2017-06-17: 300 mg via ORAL
  Filled 2017-06-14: qty 1

## 2017-06-14 MED ORDER — BUPIVACAINE LIPOSOME 1.3 % IJ SUSP
20.0000 mL | INTRAMUSCULAR | Status: AC
  Administered 2017-06-17: 20 mL
  Filled 2017-06-14: qty 20

## 2017-06-14 MED ORDER — TRANEXAMIC ACID 1000 MG/10ML IV SOLN
1000.0000 mg | INTRAVENOUS | Status: AC
  Administered 2017-06-17: 1000 mg via INTRAVENOUS
  Filled 2017-06-14: qty 10

## 2017-06-14 MED ORDER — DEXAMETHASONE SODIUM PHOSPHATE 10 MG/ML IJ SOLN
8.0000 mg | Freq: Once | INTRAMUSCULAR | Status: AC
  Administered 2017-06-17: 8 mg via INTRAVENOUS
  Filled 2017-06-14: qty 1

## 2017-06-14 MED ORDER — CEFAZOLIN SODIUM-DEXTROSE 2-4 GM/100ML-% IV SOLN
2.0000 g | INTRAVENOUS | Status: AC
  Administered 2017-06-17: 2 g via INTRAVENOUS
  Filled 2017-06-14: qty 100

## 2017-06-14 MED ORDER — ACETAMINOPHEN 500 MG PO TABS
1000.0000 mg | ORAL_TABLET | Freq: Once | ORAL | Status: AC
  Administered 2017-06-17: 1000 mg via ORAL
  Filled 2017-06-14: qty 2

## 2017-06-16 NOTE — H&P (Signed)
Christopher [name] MRN:  [id number] DOB/SEX:  [birth year]/male  CHIEF COMPLAINT:  Painful left Knee  HISTORY: Patient is a 71 y.o. male presented with a history of pain in the left knee. Onset of symptoms was gradual starting a few years ago with gradually worsening course since that time.. Patient has been treated conservatively with over-the-counter NSAIDs and activity modification. Patient currently rates pain in the knee at 10 out of 10 with activity. There is pain at night.  PAST MEDICAL HISTORY: Patient Active Problem List   Diagnosis Date Noted  . Spondylolisthesis of lumbar region 03/05/2014  . Barrett esophagus 02/24/2013  . Personal history of colonic polyps 02/24/2013  . Epigastric pain 01/06/2013  . Encounter for screening colonoscopy 01/06/2013  . Pain in joint, shoulder region 07/09/2012  . Muscle weakness (generalized) 07/09/2012   Past Medical History:  Diagnosis Date  . Arthritis   . BPH (benign prostatic hyperplasia)    takes Proscar daily  . Chronic back pain    DDD  . Constipation    takes Colace daily  . DDD (degenerative disc disease), cervical   . GERD (gastroesophageal reflux disease)    takes Nexium daily  . H/O hiatal hernia   . Headache(784.0)    occasionally  . History of colon polyps    benign  . History of gastric ulcer   . Insomnia    takes Xanax nightly  . Joint pain   . Joint swelling   . Macular degeneration   . Muscle spasm of back    takes Valium daily as needed  . Peripheral neuropathy    takes Gabapentin daily  . PONV (postoperative nausea and vomiting)   . Weakness    numbness and tingling   Past Surgical History:  Procedure Laterality Date  . APPENDECTOMY    . BACK SURGERY     x 4-with fusion being one of those times  . CARPAL TUNNEL RELEASE Bilateral   . CATARACT EXTRACTION W/PHACO Right 05/24/2014   Procedure: CATARACT EXTRACTION PHACO AND INTRAOCULAR LENS PLACEMENT (IOC);  Surgeon: [name], MD;  Location: [facility];  Service:  Ophthalmology;  Laterality: Right;  CDE 6.74  . CATARACT EXTRACTION W/PHACO Left 06/17/2014   Procedure: CATARACT EXTRACTION PHACO AND INTRAOCULAR LENS PLACEMENT (IOC);  Surgeon: [name], MD;  Location: [facility];  Service: Ophthalmology;  Laterality: Left;  CDE:4.65  . COLONOSCOPY WITH ESOPHAGOGASTRODUODENOSCOPY (EGD) N/A 01/22/2013   Procedure: COLONOSCOPY WITH ESOPHAGOGASTRODUODENOSCOPY (EGD);  Surgeon: [name], MD;  Location: [facility] SUITE;  Service: Endoscopy;  Laterality: N/A;  140  . NECK SURGERY     x 3  . SHOULDER ARTHROSCOPY Bilateral   . SPINAL CORD STIMULATOR INSERTION N/A 09/23/2015   Procedure: LUMBAR SPINAL CORD STIMULATOR INSERTION;  Surgeon: [name], MD;  Location: [place] [facility] ORS;  Service: Neurosurgery;  Laterality: N/A;  LUMBAR SPINAL CORD STIMULATOR INSERTION     MEDICATIONS:   No prescriptions prior to admission.    ALLERGIES:   Allergies  Allergen Reactions  . Morphine And Related Nausea And Vomiting  . Propofol Nausea And Vomiting    REVIEW OF SYSTEMS:  A comprehensive review of systems was negative except for: Musculoskeletal: positive for arthralgias and bone pain   FAMILY HISTORY:  No family history on file.  SOCIAL HISTORY:   Social History  Substance Use Topics  . Smoking status: Former Research scientist (life sciences)  . Smokeless tobacco: Former Systems developer     Comment: quit in 1987  . Alcohol use 2.4 oz/week  4 Cans of beer per week     Comment: or 3 wine coolers     EXAMINATION:  Vital signs in last 24 hours:    There were no vitals taken for this visit.  General Appearance:    Alert, cooperative, no distress, appears stated age  Head:    Normocephalic, without obvious abnormality, atraumatic  Eyes:    PERRL, conjunctiva/corneas clear, EOM's intact, fundi    benign, both eyes       Ears:    Normal TM's and external ear canals, both ears  Nose:   Nares normal, septum midline, mucosa normal, no drainage    or sinus tenderness  Throat:   Lips, mucosa, and  tongue normal; teeth and gums normal  Neck:   Supple, symmetrical, trachea midline, no adenopathy;       thyroid:  No enlargement/tenderness/nodules; no carotid   bruit or JVD  Back:     Symmetric, no curvature, ROM normal, no CVA tenderness  Lungs:     Clear to auscultation bilaterally, respirations unlabored  Chest wall:    No tenderness or deformity  Heart:    Regular rate and rhythm, S1 and S2 normal, no murmur, rub   or gallop  Abdomen:     Soft, non-tender, bowel sounds active all four quadrants,    no masses, no organomegaly  Genitalia:    Normal male without lesion, discharge or tenderness  Rectal:    Normal tone, normal prostate, no masses or tenderness;   guaiac negative stool  Extremities:   Extremities normal, atraumatic, no cyanosis or edema  Pulses:   2+ and symmetric all extremities  Skin:   Skin color, texture, turgor normal, no rashes or lesions  Lymph nodes:   Cervical, supraclavicular, and axillary nodes normal  Neurologic:   CNII-XII intact. Normal strength, sensation and reflexes      throughout    Musculoskeletal:  ROM 0-120, Ligaments intact,  Imaging Review Plain radiographs demonstrate severe degenerative joint disease of the left knee. The overall alignment is neutral. The bone quality appears to be good for age and reported activity level.  Assessment/Plan: Primary osteoarthritis, left knee   The patient history, physical examination and imaging studies are consistent with advanced degenerative joint disease of the left knee. The patient has failed conservative treatment.  The clearance notes were reviewed.  After discussion with the patient it was felt that Total Knee Replacement was indicated. The procedure,  risks, and benefits of total knee arthroplasty were presented and reviewed. The risks including but not limited to aseptic loosening, infection, blood clots, vascular injury, stiffness, patella tracking problems complications among others were discussed.  The patient acknowledged the explanation, agreed to proceed with the plan.  Donia Ast 06/16/2017, 9:37 PM

## 2017-06-17 ENCOUNTER — Encounter (HOSPITAL_COMMUNITY)
Admission: RE | Disposition: A | Discharge status: Home / Self Care | Payer: Self-pay | Source: Ambulatory Visit | Attending: Orthopedic Surgery

## 2017-06-17 ENCOUNTER — Ambulatory Visit (HOSPITAL_COMMUNITY): Payer: Medicare Other | Admitting: Anesthesiology

## 2017-06-17 ENCOUNTER — Observation Stay (HOSPITAL_COMMUNITY)
Admission: RE | Admit: 2017-06-17 | Discharge: 2017-06-18 | Disposition: A | Discharge status: Home / Self Care | Payer: Medicare Other | Source: Ambulatory Visit | Attending: Orthopedic Surgery | Admitting: Orthopedic Surgery

## 2017-06-17 ENCOUNTER — Encounter (HOSPITAL_COMMUNITY): Payer: Self-pay | Admitting: *Deleted

## 2017-06-17 DIAGNOSIS — G4700 Insomnia, unspecified: Secondary | ICD-10-CM | POA: Diagnosis not present

## 2017-06-17 DIAGNOSIS — K219 Gastro-esophageal reflux disease without esophagitis: Secondary | ICD-10-CM | POA: Diagnosis not present

## 2017-06-17 DIAGNOSIS — Z96659 Presence of unspecified artificial knee joint: Secondary | ICD-10-CM

## 2017-06-17 DIAGNOSIS — Z79899 Other long term (current) drug therapy: Secondary | ICD-10-CM | POA: Insufficient documentation

## 2017-06-17 DIAGNOSIS — M1712 Unilateral primary osteoarthritis, left knee: Secondary | ICD-10-CM | POA: Diagnosis present

## 2017-06-17 DIAGNOSIS — Z8719 Personal history of other diseases of the digestive system: Secondary | ICD-10-CM | POA: Diagnosis not present

## 2017-06-17 DIAGNOSIS — Z7982 Long term (current) use of aspirin: Secondary | ICD-10-CM | POA: Diagnosis not present

## 2017-06-17 DIAGNOSIS — G629 Polyneuropathy, unspecified: Secondary | ICD-10-CM | POA: Insufficient documentation

## 2017-06-17 DIAGNOSIS — N400 Enlarged prostate without lower urinary tract symptoms: Secondary | ICD-10-CM | POA: Insufficient documentation

## 2017-06-17 HISTORY — DX: Hesitancy of micturition: R39.11

## 2017-06-17 HISTORY — PX: TOTAL KNEE ARTHROPLASTY: SHX125

## 2017-06-17 HISTORY — DX: Benign prostatic hyperplasia without lower urinary tract symptoms: N40.0

## 2017-06-17 SURGERY — ARTHROPLASTY, KNEE, TOTAL
Anesthesia: General | Laterality: Left

## 2017-06-17 MED ORDER — ACETAMINOPHEN 325 MG PO TABS: 650.0000 mg | ORAL_TABLET | Freq: Four times a day (QID) | ORAL | Status: DC | PRN

## 2017-06-17 MED ORDER — FENTANYL CITRATE (PF) 100 MCG/2ML IJ SOLN
INTRAMUSCULAR | Status: AC
  Administered 2017-06-17: 25 ug via INTRAVENOUS
  Filled 2017-06-17: qty 2

## 2017-06-17 MED ORDER — FLEET ENEMA 7-19 GM/118ML RE ENEM: 1.0000 | ENEMA | Freq: Once | RECTAL | Status: DC | PRN

## 2017-06-17 MED ORDER — BUPIVACAINE-EPINEPHRINE (PF) 0.5% -1:200000 IJ SOLN
INTRAMUSCULAR | Status: AC
  Filled 2017-06-17: qty 30

## 2017-06-17 MED ORDER — ALPRAZOLAM 0.5 MG PO TABS
1.0000 mg | ORAL_TABLET | Freq: Every day | ORAL | Status: DC
  Administered 2017-06-17: 1 mg via ORAL
  Filled 2017-06-17: qty 2

## 2017-06-17 MED ORDER — SCOPOLAMINE 1 MG/3DAYS TD PT72
MEDICATED_PATCH | TRANSDERMAL | Status: DC | PRN
  Administered 2017-06-17: 1 via TRANSDERMAL

## 2017-06-17 MED ORDER — MIDAZOLAM HCL 2 MG/2ML IJ SOLN
INTRAMUSCULAR | Status: AC
  Filled 2017-06-17: qty 2

## 2017-06-17 MED ORDER — PHENOL 1.4 % MT LIQD: 1.0000 | OROMUCOSAL | Status: DC | PRN

## 2017-06-17 MED ORDER — CHLORHEXIDINE GLUCONATE 4 % EX LIQD: 60.0000 mL | Freq: Once | CUTANEOUS | Status: DC

## 2017-06-17 MED ORDER — CELECOXIB 200 MG PO CAPS
200.0000 mg | ORAL_CAPSULE | Freq: Two times a day (BID) | ORAL | Status: DC
  Administered 2017-06-17 – 2017-06-18 (×3): 200 mg via ORAL
  Filled 2017-06-17 (×3): qty 1

## 2017-06-17 MED ORDER — ACETAMINOPHEN 650 MG RE SUPP: 650.0000 mg | Freq: Four times a day (QID) | RECTAL | Status: DC | PRN

## 2017-06-17 MED ORDER — GABAPENTIN 300 MG PO CAPS
300.0000 mg | ORAL_CAPSULE | Freq: Three times a day (TID) | ORAL | Status: DC
  Administered 2017-06-17 – 2017-06-18 (×3): 300 mg via ORAL
  Filled 2017-06-17 (×3): qty 1

## 2017-06-17 MED ORDER — FENTANYL CITRATE (PF) 250 MCG/5ML IJ SOLN
INTRAMUSCULAR | Status: AC
  Filled 2017-06-17: qty 5

## 2017-06-17 MED ORDER — DEXAMETHASONE SODIUM PHOSPHATE 10 MG/ML IJ SOLN
10.0000 mg | Freq: Once | INTRAMUSCULAR | Status: AC
  Administered 2017-06-18: 10 mg via INTRAVENOUS
  Filled 2017-06-17: qty 1

## 2017-06-17 MED ORDER — DIPHENHYDRAMINE HCL 50 MG/ML IJ SOLN
INTRAMUSCULAR | Status: DC | PRN
  Administered 2017-06-17: 12.5 mg via INTRAVENOUS

## 2017-06-17 MED ORDER — PROMETHAZINE HCL 25 MG/ML IJ SOLN: 6.2500 mg | INTRAMUSCULAR | Status: DC | PRN

## 2017-06-17 MED ORDER — OXYCODONE HCL 5 MG PO TABS
ORAL_TABLET | ORAL | Status: AC
  Administered 2017-06-17: 10 mg via ORAL
  Filled 2017-06-17: qty 2

## 2017-06-17 MED ORDER — EPHEDRINE 5 MG/ML INJ
INTRAVENOUS | Status: AC
  Filled 2017-06-17: qty 10

## 2017-06-17 MED ORDER — FINASTERIDE 5 MG PO TABS
5.0000 mg | ORAL_TABLET | Freq: Every day | ORAL | Status: DC
  Administered 2017-06-18: 5 mg via ORAL
  Filled 2017-06-17: qty 1

## 2017-06-17 MED ORDER — METOCLOPRAMIDE HCL 5 MG PO TABS: 5.0000 mg | ORAL_TABLET | Freq: Three times a day (TID) | ORAL | Status: DC | PRN

## 2017-06-17 MED ORDER — ONDANSETRON HCL 4 MG/2ML IJ SOLN
4.0000 mg | Freq: Four times a day (QID) | INTRAMUSCULAR | Status: DC | PRN
  Administered 2017-06-17: 4 mg via INTRAVENOUS
  Filled 2017-06-17: qty 2

## 2017-06-17 MED ORDER — ONDANSETRON HCL 4 MG/2ML IJ SOLN
INTRAMUSCULAR | Status: AC
  Filled 2017-06-17: qty 2

## 2017-06-17 MED ORDER — ASPIRIN EC 325 MG PO TBEC
325.0000 mg | DELAYED_RELEASE_TABLET | Freq: Two times a day (BID) | ORAL | Status: DC
  Administered 2017-06-17 – 2017-06-18 (×2): 325 mg via ORAL
  Filled 2017-06-17 (×2): qty 1

## 2017-06-17 MED ORDER — ZOLPIDEM TARTRATE 5 MG PO TABS: 5.0000 mg | ORAL_TABLET | Freq: Every evening | ORAL | Status: DC | PRN

## 2017-06-17 MED ORDER — METOCLOPRAMIDE HCL 5 MG/ML IJ SOLN
5.0000 mg | Freq: Three times a day (TID) | INTRAMUSCULAR | Status: DC | PRN
  Administered 2017-06-17: 10 mg via INTRAVENOUS

## 2017-06-17 MED ORDER — TAMSULOSIN HCL 0.4 MG PO CAPS
0.4000 mg | ORAL_CAPSULE | Freq: Every day | ORAL | Status: DC
  Administered 2017-06-18: 0.4 mg via ORAL
  Filled 2017-06-17: qty 1

## 2017-06-17 MED ORDER — ALFUZOSIN HCL ER 10 MG PO TB24
10.0000 mg | ORAL_TABLET | Freq: Every day | ORAL | Status: DC
  Administered 2017-06-18: 10 mg via ORAL
  Filled 2017-06-17: qty 1

## 2017-06-17 MED ORDER — KETOROLAC TROMETHAMINE 30 MG/ML IJ SOLN
30.0000 mg | Freq: Once | INTRAMUSCULAR | Status: DC | PRN
  Administered 2017-06-17: 30 mg via INTRAVENOUS

## 2017-06-17 MED ORDER — PANTOPRAZOLE SODIUM 40 MG PO TBEC
80.0000 mg | DELAYED_RELEASE_TABLET | Freq: Every day | ORAL | Status: DC
  Administered 2017-06-18: 80 mg via ORAL
  Filled 2017-06-17: qty 2

## 2017-06-17 MED ORDER — DIPHENHYDRAMINE HCL 12.5 MG/5ML PO ELIX: 12.5000 mg | ORAL_SOLUTION | ORAL | Status: DC | PRN

## 2017-06-17 MED ORDER — ONDANSETRON HCL 4 MG PO TABS: 4.0000 mg | ORAL_TABLET | Freq: Four times a day (QID) | ORAL | Status: DC | PRN

## 2017-06-17 MED ORDER — PROPOFOL 10 MG/ML IV BOLUS
INTRAVENOUS | Status: DC | PRN
  Administered 2017-06-17: 150 mg via INTRAVENOUS

## 2017-06-17 MED ORDER — BISACODYL 5 MG PO TBEC: 5.0000 mg | DELAYED_RELEASE_TABLET | Freq: Every day | ORAL | Status: DC | PRN

## 2017-06-17 MED ORDER — LACTATED RINGERS IV SOLN
INTRAVENOUS | Status: DC
  Administered 2017-06-17 (×2): via INTRAVENOUS

## 2017-06-17 MED ORDER — LIDOCAINE HCL (CARDIAC) 20 MG/ML IV SOLN
INTRAVENOUS | Status: DC | PRN
  Administered 2017-06-17: 60 mg via INTRATRACHEAL

## 2017-06-17 MED ORDER — BUPIVACAINE-EPINEPHRINE 0.5% -1:200000 IJ SOLN
INTRAMUSCULAR | Status: DC | PRN
  Administered 2017-06-17: 30 mL

## 2017-06-17 MED ORDER — BUPIVACAINE-EPINEPHRINE (PF) 0.5% -1:200000 IJ SOLN
INTRAMUSCULAR | Status: DC | PRN
  Administered 2017-06-17: 30 mL via PERINEURAL

## 2017-06-17 MED ORDER — METHOCARBAMOL 500 MG PO TABS
500.0000 mg | ORAL_TABLET | Freq: Four times a day (QID) | ORAL | Status: DC | PRN
  Administered 2017-06-17: 500 mg via ORAL

## 2017-06-17 MED ORDER — TRANEXAMIC ACID 1000 MG/10ML IV SOLN
1000.0000 mg | Freq: Once | INTRAVENOUS | Status: AC
  Administered 2017-06-17: 1000 mg via INTRAVENOUS
  Filled 2017-06-17: qty 10

## 2017-06-17 MED ORDER — FENTANYL CITRATE (PF) 100 MCG/2ML IJ SOLN
25.0000 ug | INTRAMUSCULAR | Status: DC | PRN
  Administered 2017-06-17 (×2): 25 ug via INTRAVENOUS

## 2017-06-17 MED ORDER — SCOPOLAMINE 1 MG/3DAYS TD PT72
MEDICATED_PATCH | TRANSDERMAL | Status: AC
  Filled 2017-06-17: qty 1

## 2017-06-17 MED ORDER — FENTANYL CITRATE (PF) 100 MCG/2ML IJ SOLN
INTRAMUSCULAR | Status: AC
  Administered 2017-06-17: 50 ug via INTRAVENOUS
  Filled 2017-06-17: qty 2

## 2017-06-17 MED ORDER — PHENYLEPHRINE HCL 10 MG/ML IJ SOLN
INTRAVENOUS | Status: DC | PRN
  Administered 2017-06-17: 25 ug/min via INTRAVENOUS

## 2017-06-17 MED ORDER — METHOCARBAMOL 500 MG PO TABS
ORAL_TABLET | ORAL | Status: AC
  Administered 2017-06-17: 500 mg via ORAL
  Filled 2017-06-17: qty 1

## 2017-06-17 MED ORDER — OXYCODONE HCL 5 MG PO TABS
5.0000 mg | ORAL_TABLET | ORAL | Status: DC | PRN
  Administered 2017-06-17: 10 mg via ORAL
  Administered 2017-06-17: 5 mg via ORAL
  Filled 2017-06-17: qty 1

## 2017-06-17 MED ORDER — MEPERIDINE HCL 25 MG/ML IJ SOLN: 6.2500 mg | INTRAMUSCULAR | Status: DC | PRN

## 2017-06-17 MED ORDER — METHOCARBAMOL 1000 MG/10ML IJ SOLN
500.0000 mg | Freq: Four times a day (QID) | INTRAVENOUS | Status: DC | PRN
  Filled 2017-06-17: qty 5

## 2017-06-17 MED ORDER — DIPHENHYDRAMINE HCL 50 MG/ML IJ SOLN
INTRAMUSCULAR | Status: AC
  Filled 2017-06-17: qty 1

## 2017-06-17 MED ORDER — CEFAZOLIN SODIUM-DEXTROSE 1-4 GM/50ML-% IV SOLN
1.0000 g | Freq: Four times a day (QID) | INTRAVENOUS | Status: AC
  Administered 2017-06-17 (×2): 1 g via INTRAVENOUS
  Filled 2017-06-17 (×2): qty 50

## 2017-06-17 MED ORDER — HYDROMORPHONE HCL 1 MG/ML IJ SOLN: 1.0000 mg | INTRAMUSCULAR | Status: DC | PRN

## 2017-06-17 MED ORDER — METOCLOPRAMIDE HCL 5 MG/ML IJ SOLN
INTRAMUSCULAR | Status: AC
  Filled 2017-06-17: qty 2

## 2017-06-17 MED ORDER — SENNOSIDES-DOCUSATE SODIUM 8.6-50 MG PO TABS: 1.0000 | ORAL_TABLET | Freq: Every evening | ORAL | Status: DC | PRN

## 2017-06-17 MED ORDER — DOCUSATE SODIUM 100 MG PO CAPS
100.0000 mg | ORAL_CAPSULE | Freq: Two times a day (BID) | ORAL | Status: DC
  Administered 2017-06-17 – 2017-06-18 (×2): 100 mg via ORAL
  Filled 2017-06-17 (×2): qty 1

## 2017-06-17 MED ORDER — SODIUM CHLORIDE 0.9 % IR SOLN
Status: DC | PRN
  Administered 2017-06-17: 3000 mL

## 2017-06-17 MED ORDER — MENTHOL 3 MG MT LOZG: 1.0000 | LOZENGE | OROMUCOSAL | Status: DC | PRN

## 2017-06-17 MED ORDER — ALUM & MAG HYDROXIDE-SIMETH 200-200-20 MG/5ML PO SUSP: 30.0000 mL | ORAL | Status: DC | PRN

## 2017-06-17 MED ORDER — FENTANYL CITRATE (PF) 100 MCG/2ML IJ SOLN
50.0000 ug | Freq: Once | INTRAMUSCULAR | Status: AC
Start: 2017-06-17 — End: 2017-06-17
  Administered 2017-06-17: 50 ug via INTRAVENOUS

## 2017-06-17 MED ORDER — KETOROLAC TROMETHAMINE 30 MG/ML IJ SOLN
INTRAMUSCULAR | Status: AC
  Filled 2017-06-17: qty 1

## 2017-06-17 MED ORDER — FENTANYL CITRATE (PF) 250 MCG/5ML IJ SOLN
INTRAMUSCULAR | Status: DC | PRN
  Administered 2017-06-17 (×3): 50 ug via INTRAVENOUS

## 2017-06-17 MED ORDER — BUPIVACAINE-EPINEPHRINE (PF) 0.25% -1:200000 IJ SOLN
INTRAMUSCULAR | Status: AC
  Filled 2017-06-17: qty 30

## 2017-06-17 MED ORDER — HYDROCODONE-ACETAMINOPHEN 7.5-325 MG PO TABS
1.0000 | ORAL_TABLET | Freq: Four times a day (QID) | ORAL | Status: DC
  Administered 2017-06-17 – 2017-06-18 (×4): 1 via ORAL
  Filled 2017-06-17 (×4): qty 1

## 2017-06-17 SURGICAL SUPPLY — 62 items
BANDAGE ACE 6X5 VEL STRL LF (GAUZE/BANDAGES/DRESSINGS) ×2 IMPLANT
BANDAGE ESMARK 6X9 LF (GAUZE/BANDAGES/DRESSINGS) ×1 IMPLANT
BLADE SAGITTAL 13X1.27X60 (BLADE) ×2 IMPLANT
BLADE SAW SGTL 83.5X18.5 (BLADE) ×2 IMPLANT
BLADE SURG 10 STRL SS (BLADE) ×2 IMPLANT
BNDG ESMARK 6X9 LF (GAUZE/BANDAGES/DRESSINGS) ×2
BOWL SMART MIX CTS (DISPOSABLE) ×2 IMPLANT
CAPT KNEE TOTAL 3 ×2 IMPLANT
CEMENT BONE SIMPLEX SPEEDSET (Cement) ×4 IMPLANT
COVER SURGICAL LIGHT HANDLE (MISCELLANEOUS) ×2 IMPLANT
CUFF TOURNIQUET SINGLE 34IN LL (TOURNIQUET CUFF) ×2 IMPLANT
DRAPE EXTREMITY T 121X128X90 (DRAPE) ×2 IMPLANT
DRAPE HALF SHEET 40X57 (DRAPES) ×2 IMPLANT
DRAPE INCISE IOBAN 66X45 STRL (DRAPES) ×4 IMPLANT
DRAPE U-SHAPE 47X51 STRL (DRAPES) ×2 IMPLANT
DRSG AQUACEL AG ADV 3.5X10 (GAUZE/BANDAGES/DRESSINGS) ×2 IMPLANT
DURAPREP 26ML APPLICATOR (WOUND CARE) ×4 IMPLANT
ELECT CAUTERY BLADE 6.4 (BLADE) ×2 IMPLANT
ELECT REM PT RETURN 9FT ADLT (ELECTROSURGICAL) ×2
ELECTRODE REM PT RTRN 9FT ADLT (ELECTROSURGICAL) ×1 IMPLANT
FILTER STRAW FLUID ASPIR (MISCELLANEOUS) IMPLANT
GLOVE BIO SURGEON STRL SZ7 (GLOVE) ×2 IMPLANT
GLOVE BIOGEL M 7.0 STRL (GLOVE) IMPLANT
GLOVE BIOGEL PI IND STRL 6.5 (GLOVE) ×1 IMPLANT
GLOVE BIOGEL PI IND STRL 7.5 (GLOVE) IMPLANT
GLOVE BIOGEL PI IND STRL 8.5 (GLOVE) ×1 IMPLANT
GLOVE BIOGEL PI INDICATOR 6.5 (GLOVE) ×1
GLOVE BIOGEL PI INDICATOR 7.5 (GLOVE)
GLOVE BIOGEL PI INDICATOR 8.5 (GLOVE) ×1
GLOVE SURG ORTHO 8.0 STRL STRW (GLOVE) ×4 IMPLANT
GOWN STRL REUS W/ TWL LRG LVL3 (GOWN DISPOSABLE) ×3 IMPLANT
GOWN STRL REUS W/ TWL XL LVL3 (GOWN DISPOSABLE) ×2 IMPLANT
GOWN STRL REUS W/TWL 2XL LVL3 (GOWN DISPOSABLE) ×2 IMPLANT
GOWN STRL REUS W/TWL LRG LVL3 (GOWN DISPOSABLE) ×3
GOWN STRL REUS W/TWL XL LVL3 (GOWN DISPOSABLE) ×2
HANDPIECE INTERPULSE COAX TIP (DISPOSABLE) ×1
HOOD PEEL AWAY FACE SHEILD DIS (HOOD) ×6 IMPLANT
KIT BASIN OR (CUSTOM PROCEDURE TRAY) ×2 IMPLANT
KIT ROOM TURNOVER OR (KITS) ×2 IMPLANT
KNEE CAPITATED TOTAL 3 ×1 IMPLANT
MANIFOLD NEPTUNE II (INSTRUMENTS) ×2 IMPLANT
NEEDLE 18GX1X1/2 (RX/OR ONLY) (NEEDLE) IMPLANT
NEEDLE 22X1 1/2 (OR ONLY) (NEEDLE) ×4 IMPLANT
NS IRRIG 1000ML POUR BTL (IV SOLUTION) ×2 IMPLANT
PACK TOTAL JOINT (CUSTOM PROCEDURE TRAY) ×2 IMPLANT
PAD ARMBOARD 7.5X6 YLW CONV (MISCELLANEOUS) IMPLANT
SET HNDPC FAN SPRY TIP SCT (DISPOSABLE) ×1 IMPLANT
STRIP CLOSURE SKIN 1/2X4 (GAUZE/BANDAGES/DRESSINGS) ×2 IMPLANT
SUCTION FRAZIER HANDLE 10FR (MISCELLANEOUS)
SUCTION TUBE FRAZIER 10FR DISP (MISCELLANEOUS) IMPLANT
SUT MNCRL AB 3-0 PS2 18 (SUTURE) ×2 IMPLANT
SUT VIC AB 0 CTB1 27 (SUTURE) ×4 IMPLANT
SUT VIC AB 1 CT1 27 (SUTURE) ×3
SUT VIC AB 1 CT1 27XBRD ANBCTR (SUTURE) ×3 IMPLANT
SUT VIC AB 2-0 CT1 27 (SUTURE) ×2
SUT VIC AB 2-0 CT1 TAPERPNT 27 (SUTURE) ×2 IMPLANT
SYR 20CC LL (SYRINGE) ×4 IMPLANT
SYR TB 1ML LUER SLIP (SYRINGE) IMPLANT
TOWEL OR 17X24 6PK STRL BLUE (TOWEL DISPOSABLE) ×2 IMPLANT
TOWEL OR 17X26 10 PK STRL BLUE (TOWEL DISPOSABLE) ×2 IMPLANT
TRAY CATH 16FR W/PLASTIC CATH (SET/KITS/TRAYS/PACK) IMPLANT
WRAP KNEE MAXI GEL POST OP (GAUZE/BANDAGES/DRESSINGS) ×2 IMPLANT

## 2017-06-17 NOTE — Transfer of Care (Signed)
Immediate Anesthesia Transfer of Care Note  Patient: Christopher [name]  Procedure(s) Performed: Procedure(s): TOTAL KNEE ARTHROPLASTY (Left)  Patient Location: PACU  Anesthesia Type:GA combined with regional for post-op pain  Level of Consciousness: awake, alert  and oriented  Airway & Oxygen Therapy: Patient Spontanous Breathing and Patient connected to nasal cannula oxygen  Post-op Assessment: Report given to RN and Post -op Vital signs reviewed and stable  Post vital signs: Reviewed and stable  Last Vitals:  Vitals:   06/17/17 0915 06/17/17 0917  BP: 132/66 132/66  Pulse: (!) 51 (!) 51  Resp: 18 17  Temp:      Last Pain:  Vitals:   06/17/17 0717  TempSrc: Oral         Complications: No apparent anesthesia complications

## 2017-06-17 NOTE — Evaluation (Signed)
Physical Therapy Evaluation Patient Details Name: Christopher [name] MRN: [id number] DOB: 12/27/1945 Today's Date: 06/17/2017   History of Present Illness  Pt is a 71 y/o male s/p elective L TKA secondary to L knee OA. PMH includes BPH, chronic back pain s/p back surgery X 4 and spinal cord stimulator insertion.   Clinical Impression  Pt is s/p surgery above with deficits below. PTA, pt was independent with functional mobility. Upon evaluation, pt limited by post op pain and weakness. Required min guard assist for safety with functional mobility. Pt reports he will be getting HHPT at d/c and will need RW for ambulation. Pt reports wife will be available 24/7 upon d/c. Will continue to follow acutely to maximize functional mobility independence.      Follow Up Recommendations DC plan and follow up therapy as arranged by surgeon;Supervision/Assistance - 24 hour    Equipment Recommendations  Rolling walker with 5" wheels    Recommendations for Other Services       Precautions / Restrictions Precautions Precautions: Knee Precaution Booklet Issued: Yes (comment) Precaution Comments: Reviewed supine ther ex with pt  Restrictions Weight Bearing Restrictions: Yes LLE Weight Bearing: Weight bearing as tolerated      Mobility  Bed Mobility Overal bed mobility: Modified Independent             General bed mobility comments: Increased time, however, no assist required   Transfers Overall transfer level: Needs assistance Equipment used: Rolling walker (2 wheeled) Transfers: Sit to/from Stand Sit to Stand: Min guard         General transfer comment: Min guard for safety. Verbal cues for safe hand placement as pt was pulling up on RW.   Ambulation/Gait Ambulation/Gait assistance: Min guard Ambulation Distance (Feet): 50 Feet Assistive device: Rolling walker (2 wheeled) Gait Pattern/deviations: Step-through pattern;Decreased step length - right;Decreased step length - left;Decreased  weight shift to left;Antalgic Gait velocity: Decreased Gait velocity interpretation: Below normal speed for age/gender General Gait Details: Slow, antalgic gait secondary to post op pain and weakness. Verbal cues to increase step height in LLE. Pt reports LLE feels heavy during ambulation. Verbal cues for sequencing with RW.   Stairs            Wheelchair Mobility    Modified Rankin (Stroke Patients Only)       Balance Overall balance assessment: Needs assistance Sitting-balance support: No upper extremity supported;Feet supported Sitting balance-Leahy Scale: Good     Standing balance support: Bilateral upper extremity supported;During functional activity Standing balance-Leahy Scale: Poor Standing balance comment: Reliant on UE support for balance                              Pertinent Vitals/Pain Pain Assessment: 0-10 Pain Score: 2  Pain Location: L knee  Pain Descriptors / Indicators: Aching;Operative site guarding;Sore Pain Intervention(s): Limited activity within patient's tolerance;Monitored during session;Repositioned    Home Living Family/patient expects to be discharged to:: Private residence Living Arrangements: Spouse/significant other Available Help at Discharge: Family;Available 24 hours/day Type of Home: House Home Access: Stairs to enter;Ramped entrance Entrance Stairs-Rails: None Entrance Stairs-Number of Steps: 3 Home Layout: One level;Other (Comment) (basement ) Home Equipment: Walker - 4 wheels;Bedside commode;Crutches;Cane - single point;Hospital bed      Prior Function Level of Independence: Independent               Hand Dominance   Dominant Hand: Right    Extremity/Trunk Assessment  Upper Extremity Assessment Upper Extremity Assessment: Defer to OT evaluation    Lower Extremity Assessment Lower Extremity Assessment: LLE deficits/detail LLE Deficits / Details: numbness in the calf. Deficits consistent with post  op pain and weakness. Able to perform exercises below.     Cervical / Trunk Assessment Cervical / Trunk Assessment: Other exceptions Cervical / Trunk Exceptions: PMH of back surgery  Communication   Communication: No difficulties  Cognition Arousal/Alertness: Awake/alert Behavior During Therapy: WFL for tasks assessed/performed Overall Cognitive Status: Within Functional Limits for tasks assessed                                        General Comments General comments (skin integrity, edema, etc.): Pt's wife, son, and daughter in law present during session     Exercises Total Joint Exercises Ankle Circles/Pumps: AROM;Both;15 reps;Supine Quad Sets: AROM;Left;15 reps;Supine Towel Squeeze: AROM;Both;15 reps;Supine Short Arc Quad: AROM;Left;15 reps;Supine Heel Slides: AROM;Left;10 reps;Supine Hip ABduction/ADduction: AROM;Left;10 reps;Supine Straight Leg Raises: AROM;Left;10 reps;Supine   Assessment/Plan    PT Assessment Patient needs continued PT services  PT Problem List Decreased strength;Decreased range of motion;Decreased balance;Decreased mobility;Decreased knowledge of use of DME;Decreased knowledge of precautions;Pain       PT Treatment Interventions DME instruction;Gait training;Stair training;Functional mobility training;Therapeutic activities;Therapeutic exercise;Balance training;Neuromuscular re-education;Patient/family education    PT Goals (Current goals can be found in the Care Plan section)  Acute Rehab PT Goals Patient Stated Goal: to go home  PT Goal Formulation: With patient Time For Goal Achievement: 06/24/17 Potential to Achieve Goals: Good    Frequency 7X/week   Barriers to discharge        Co-evaluation               AM-PAC PT "6 Clicks" Daily Activity  Outcome Measure Difficulty turning over in bed (including adjusting bedclothes, sheets and blankets)?: A Little Difficulty moving from lying on back to sitting on the side  of the bed? : A Little Difficulty sitting down on and standing up from a chair with arms (e.g., wheelchair, bedside commode, etc,.)?: Total Help needed moving to and from a bed to chair (including a wheelchair)?: A Little Help needed walking in hospital room?: A Little Help needed climbing 3-5 steps with a railing? : A Little 6 Click Score: 16    End of Session Equipment Utilized During Treatment: Gait belt Activity Tolerance: Patient tolerated treatment well Patient left: in chair;with call bell/phone within reach;with family/visitor present Nurse Communication: Mobility status PT Visit Diagnosis: Other abnormalities of gait and mobility (R26.89);Pain Pain - Right/Left: Left Pain - part of body: Knee    Time: 1731-1758 PT Time Calculation (min) (ACUTE ONLY): 27 min   Charges:   PT Evaluation $PT Eval Low Complexity: 1 Procedure PT Treatments $Gait Training: 8-22 mins   PT G Codes:   PT G-Codes **NOT FOR INPATIENT CLASS** Functional Assessment Tool Used: AM-PAC 6 Clicks Basic Mobility;Clinical judgement Functional Limitation: Mobility: Walking and moving around Mobility: Walking and Moving Around Current Status (L8756): At least 40 percent but less than 60 percent impaired, limited or restricted Mobility: Walking and Moving Around Goal Status [phone]): At least 1 percent but less than 20 percent impaired, limited or restricted    Leighton Ruff, PT, DPT  Acute Rehabilitation Services  Pager: [phone]   Rudean Hitt 06/17/2017, 6:40 PM

## 2017-06-17 NOTE — Anesthesia Procedure Notes (Addendum)
Anesthesia Regional Block: Adductor canal block   Pre-Anesthetic Checklist: ,, timeout performed, Correct Patient, Correct Site, Correct Laterality, Correct Procedure, Correct Position, site marked, Risks and benefits discussed,  Surgical consent,  Pre-op evaluation,  At surgeon's request and post-op pain management  Laterality: Left and Upper  Prep: chloraprep       Needles:  Injection technique: Single-shot  Needle Type: Echogenic Stimulator Needle     Needle Length: 10cm  Needle Gauge: 21   Needle insertion depth: 5 cm   Additional Needles:   Procedures: ultrasound guided,,,,,,,, #20gu IV placed  Narrative:  Start time: 06/17/2017 9:05 AM End time: 06/17/2017 9:11 AM Injection made incrementally with aspirations every 5 mL.  Performed by: Personally  Anesthesiologist: Lyn Hollingshead

## 2017-06-17 NOTE — Plan of Care (Signed)
Problem: Pain Managment: Goal: General experience of comfort will improve Outcome: Progressing Pt currently rates pain 2/10. No distress at this time. Will continue to assess.

## 2017-06-17 NOTE — Anesthesia Preprocedure Evaluation (Addendum)
Anesthesia Evaluation  Patient identified by MRN, date of birth, ID band Patient awake    Reviewed: Allergy & Precautions, NPO status , Patient's Chart, lab work & pertinent test results  History of Anesthesia Complications (+) PONV  Airway Mallampati: I       Dental no notable dental hx.    Pulmonary former smoker,    Pulmonary exam normal breath sounds clear to auscultation       Cardiovascular negative cardio ROS Normal cardiovascular exam Rate:Normal     Neuro/Psych negative psych ROS   GI/Hepatic Neg liver ROS, GERD  Medicated,  Endo/Other  negative endocrine ROS  Renal/GU negative Renal ROS  negative genitourinary   Musculoskeletal   Abdominal Normal abdominal exam  (+)   Peds  Hematology negative hematology ROS (+)   Anesthesia Other Findings   Reproductive/Obstetrics                             Anesthesia Physical Anesthesia Plan  ASA: II  Anesthesia Plan: General   Post-op Pain Management:  Regional for Post-op pain   Induction: Intravenous  PONV Risk Score and Plan: 3 and Ondansetron, Dexamethasone, Propofol, Midazolam and Treatment may vary due to age or medical condition  Airway Management Planned: LMA  Additional Equipment:   Intra-op Plan:   Post-operative Plan:   Informed Consent: I have reviewed the patients History and Physical, chart, labs and discussed the procedure including the risks, benefits and alternatives for the proposed anesthesia with the patient or authorized representative who has indicated his/her understanding and acceptance.   Dental advisory given  Plan Discussed with: CRNA and Surgeon  Anesthesia Plan Comments:        Anesthesia Quick Evaluation

## 2017-06-17 NOTE — Anesthesia Postprocedure Evaluation (Signed)
Anesthesia Post Note  Patient: Christopher [name]  Procedure(s) Performed: Procedure(s) (LRB): TOTAL KNEE ARTHROPLASTY (Left)     Patient location during evaluation: PACU Anesthesia Type: General Level of consciousness: awake and sedated Pain management: pain level controlled Vital Signs Assessment: post-procedure vital signs reviewed and stable Respiratory status: spontaneous breathing Cardiovascular status: stable Postop Assessment: no signs of nausea or vomiting Anesthetic complications: no    Last Vitals:  Vitals:   06/17/17 1130 06/17/17 1135  BP:  119/69  Pulse:  70  Resp: 18 12  Temp: 36.6 C 36.5 C    Last Pain:  Vitals:   06/17/17 1135  TempSrc:   PainSc: 0-No pain   Pain Goal:                 Maribell Demeo JR,JOHN Cristianna Cyr

## 2017-06-17 NOTE — Progress Notes (Signed)
Orthopedic Tech Progress Note Patient Details:  Christopher [name] 12-05-1946 [id number]  CPM Left Knee CPM Left Knee: On Left Knee Flexion (Degrees): 90 Left Knee Extension (Degrees): 0 Additional Comments: applied cpm to pt left knee at 0-90 degrees.  pt tolerated well. provided bone foam at bedside.  Left knee.    [name] 06/17/2017, 3:26 PM

## 2017-06-17 NOTE — Anesthesia Procedure Notes (Signed)
Procedure Name: LMA Insertion Date/Time: 06/17/2017 9:50 AM Performed by: Mariea Clonts Pre-anesthesia Checklist: Patient identified, Emergency Drugs available, Suction available and Patient being monitored Patient Re-evaluated:Patient Re-evaluated prior to inductionOxygen Delivery Method: Circle System Utilized Preoxygenation: Pre-oxygenation with 100% oxygen Intubation Type: IV induction Ventilation: Mask ventilation without difficulty LMA: LMA inserted LMA Size: 4.0 Number of attempts: 1 Airway Equipment and Method: Bite block Placement Confirmation: positive ETCO2 Tube secured with: Tape Dental Injury: Teeth and Oropharynx as per pre-operative assessment

## 2017-06-18 ENCOUNTER — Encounter (HOSPITAL_COMMUNITY): Payer: Self-pay | Admitting: Orthopedic Surgery

## 2017-06-18 DIAGNOSIS — M1712 Unilateral primary osteoarthritis, left knee: Secondary | ICD-10-CM | POA: Diagnosis not present

## 2017-06-18 LAB — BASIC METABOLIC PANEL
Anion gap: 9 (ref 5–15)
BUN: 17 mg/dL (ref 6–20)
CO2: 25 mmol/L (ref 22–32)
Calcium: 8.8 mg/dL — ABNORMAL LOW (ref 8.9–10.3)
Chloride: 103 mmol/L (ref 101–111)
Creatinine, Ser: 0.87 mg/dL (ref 0.61–1.24)
GFR calc Af Amer: >60 mL/min (ref >60)
GFR calc non Af Amer: >60 mL/min (ref >60)
Glucose, Bld: 119 mg/dL — ABNORMAL HIGH (ref 65–99)
Potassium: 4 mmol/L (ref 3.5–5.1)
Sodium: 137 mmol/L (ref 135–145)

## 2017-06-18 LAB — CBC
HCT: 40.7 % (ref 39.0–52.0)
Hemoglobin: 13.2 g/dL (ref 13.0–17.0)
MCH: 30.7 pg (ref 26.0–34.0)
MCHC: 32.4 g/dL (ref 30.0–36.0)
MCV: 94.7 fL (ref 78.0–100.0)
Platelets: 248 10*3/uL (ref 150–400)
RBC: 4.3 MIL/uL (ref 4.22–5.81)
RDW: 13.5 % (ref 11.5–15.5)
WBC: 13.3 10*3/uL — ABNORMAL HIGH (ref 4.0–10.5)

## 2017-06-18 MED ORDER — METHOCARBAMOL 500 MG PO TABS: 500.0000 mg | ORAL_TABLET | Freq: Four times a day (QID) | ORAL | 0 refills | Status: DC | PRN

## 2017-06-18 MED ORDER — ASPIRIN 325 MG PO TBEC: 325.0000 mg | DELAYED_RELEASE_TABLET | Freq: Two times a day (BID) | ORAL | 0 refills | Status: DC

## 2017-06-18 MED ORDER — OXYCODONE HCL 10 MG PO TABS: 5.0000 mg | ORAL_TABLET | ORAL | 0 refills | Status: DC | PRN

## 2017-06-18 NOTE — Evaluation (Signed)
Occupational Therapy Evaluation and Discharge Patient Details Name: Christopher [name] MRN: [id number] DOB: 10/01/1946 Today's Date: 06/18/2017    History of Present Illness Pt is a 71 y/o male s/p elective L TKA secondary to L knee OA. PMH includes BPH, chronic back pain s/p back surgery X 4 and spinal cord stimulator insertion.    Clinical Impression   Pt reports he was independent with ADL and mobility PTA, working in home improvement. Currently pt overall mod I with ADL and functional mobility. All ADL, safety, and knee education completed with pt. Pt planning to d/c home with family supervision. No further acute OT needs identified; signing off at this time. Please re-consult if needs change. Thank you for this referral.    Follow Up Recommendations  No OT follow up;Supervision - Intermittent    Equipment Recommendations  None recommended by OT    Recommendations for Other Services       Precautions / Restrictions Precautions Precautions: Knee Precaution Booklet Issued: No Precaution Comments: Reviewed seated ther ex with pt  Restrictions Weight Bearing Restrictions: Yes LLE Weight Bearing: Weight bearing as tolerated      Mobility Bed Mobility              General bed mobility comments: Pt OOB in chair upon arrival  Transfers Overall transfer level: Modified independent Equipment used: Rolling walker (2 wheeled) Transfers: Sit to/from Stand Sit to Stand: Modified independent (Device/Increase time)         General transfer comment: no assist, no balance deficits noted    Balance Overall balance assessment: No apparent balance deficits (not formally assessed) Sitting-balance support: No upper extremity supported;Feet supported Sitting balance-Leahy Scale: Good     Standing balance support: Bilateral upper extremity supported;During functional activity Standing balance-Leahy Scale: Fair                             ADL either performed or  assessed with clinical judgement   ADL Overall ADL's : Modified independent                                       General ADL Comments: Pt able to perform LB ADL, toilet transfer, and simulated shower transfer without assist. Discussed shower transfer technique with and without use of RW. Educated on compensatory strategies for LB ADL, ice for edema and pain     Vision         Perception     Praxis      Pertinent Vitals/Pain Pain Assessment: Faces Pain Score: 4  Faces Pain Scale: Hurts a little bit Pain Location: L knee Pain Descriptors / Indicators: Sore Pain Intervention(s): Monitored during session     Hand Dominance Right   Extremity/Trunk Assessment Upper Extremity Assessment Upper Extremity Assessment: Overall WFL for tasks assessed   Lower Extremity Assessment Lower Extremity Assessment: Defer to PT evaluation   Cervical / Trunk Assessment Cervical / Trunk Assessment: Other exceptions Cervical / Trunk Exceptions: PMH of back surgery   Communication Communication Communication: No difficulties   Cognition Arousal/Alertness: Awake/alert Behavior During Therapy: WFL for tasks assessed/performed Overall Cognitive Status: Within Functional Limits for tasks assessed                                    General  Comments       Exercises    Shoulder Instructions      Home Living Family/patient expects to be discharged to:: Private residence Living Arrangements: Spouse/significant other Available Help at Discharge: Family;Available 24 hours/day Type of Home: House Home Access: Stairs to enter;Ramped entrance Entrance Stairs-Number of Steps: 3 Entrance Stairs-Rails: None Home Layout: One level (basement)     Bathroom Shower/Tub: Occupational psychologist: Standard     Home Equipment: Environmental consultant - 4 wheels;Bedside commode;Crutches;Cane - single point;Hospital bed          Prior Functioning/Environment Level of  Independence: Independent        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        OT Problem List:        OT Treatment/Interventions:      OT Goals(Current goals can be found in the care plan section) Acute Rehab OT Goals Patient Stated Goal: to go home  OT Goal Formulation: All assessment and education complete, DC therapy  OT Frequency:     Barriers to D/C:            Co-evaluation              AM-PAC PT "6 Clicks" Daily Activity     Outcome Measure Help from another person eating meals?: None Help from another person taking care of personal grooming?: None Help from another person toileting, which includes using toliet, bedpan, or urinal?: None Help from another person bathing (including washing, rinsing, drying)?: None Help from another person to put on and taking off regular upper body clothing?: None Help from another person to put on and taking off regular lower body clothing?: None 6 Click Score: 24   End of Session Equipment Utilized During Treatment: Rolling walker CPM Left Knee CPM Left Knee: Off Left Knee Flexion (Degrees): 90 Left Knee Extension (Degrees): 0 Additional Comments: bone foam on  Activity Tolerance: Patient tolerated treatment well Patient left: in chair;with call bell/phone within reach  OT Visit Diagnosis: Other abnormalities of gait and mobility (R26.89)                Time: 1287-8676 OT Time Calculation (min): 9 min Charges:  OT General Charges $OT Visit: 1 Procedure OT Evaluation $OT Eval Low Complexity: 1 Procedure G-Codes: OT G-codes **NOT FOR INPATIENT CLASS** Functional Assessment Tool Used: AM-PAC 6 Clicks Daily Activity Functional Limitation: Self care Self Care Current Status (H2094): 0 percent impaired, limited or restricted Self Care Goal Status (B0962): 0 percent impaired, limited or restricted Self Care Discharge Status (E3662): 0 percent impaired, limited or restricted   Mel Almond A. Ulice Brilliant, M.S., OTR/L Pager:  [place] 06/18/2017, 10:36 AM

## 2017-06-18 NOTE — Progress Notes (Signed)
Physical Therapy Treatment Patient Details Name: Christopher [name] MRN: [id number] DOB: [birth year] Today's Date: 06/18/2017    History of Present Illness Pt is a 71 y/o male s/p elective L TKA secondary to L knee OA. PMH includes BPH, chronic back pain s/p back surgery X 4 and spinal cord stimulator insertion.     PT Comments    Pt is very determined and slightly impulsive. He is mobilizing well and progressing towards all goals. Expected to d/c home today. Will continue to follow acutely.   Follow Up Recommendations  DC plan and follow up therapy as arranged by surgeon;Supervision/Assistance - 24 hour     Equipment Recommendations  Rolling walker with 5" wheels    Recommendations for Other Services       Precautions / Restrictions Precautions Precautions: Knee Precaution Booklet Issued: Yes (comment) Precaution Comments: Reviewed seated ther ex with pt  Restrictions Weight Bearing Restrictions: Yes LLE Weight Bearing: Weight bearing as tolerated    Mobility  Bed Mobility Overal bed mobility: Modified Independent             General bed mobility comments: Increased time, however, no assist required   Transfers Overall transfer level: Needs assistance Equipment used: Rolling walker (2 wheeled) Transfers: Sit to/from Stand Sit to Stand: Supervision         General transfer comment: supervision for safety. Cues for hand placement when sitting.  Ambulation/Gait Ambulation/Gait assistance: Supervision Ambulation Distance (Feet): 510 Feet Assistive device: Rolling walker (2 wheeled) Gait Pattern/deviations: Step-through pattern;Decreased step length - right;Decreased step length - left;Antalgic;Decreased weight shift to left Gait velocity: Decreased Gait velocity interpretation: Below normal speed for age/gender General Gait Details: Overall steady gait. Pt c/o increased L LE pain with knee flexion during ambulation.   Stairs Stairs: Yes   Stair Management: No  rails;Step to pattern;Backwards;With walker Number of Stairs: 4 General stair comments: Pt with 3 steps and no hand rail to enter home, however also had a WC ramp.  Mod cueing for safe technique as pt was impulsive with negotiating stairs.   Wheelchair Mobility    Modified Rankin (Stroke Patients Only)       Balance Overall balance assessment: Needs assistance Sitting-balance support: No upper extremity supported;Feet supported Sitting balance-Leahy Scale: Good     Standing balance support: Bilateral upper extremity supported;During functional activity Standing balance-Leahy Scale: Fair                              Cognition Arousal/Alertness: Awake/alert Behavior During Therapy: WFL for tasks assessed/performed Overall Cognitive Status: Within Functional Limits for tasks assessed                                 General Comments: slightly impulsive      Exercises Total Joint Exercises Heel Slides: AROM;Left;10 reps;Seated (towel under foot) Long Arc Quad: AROM;Left;10 reps;Seated Knee Flexion: AROM;Left;5 reps;Seated (10 sec holds)    General Comments        Pertinent Vitals/Pain Pain Assessment: 0-10 Pain Score: 4  Pain Location: L knee  Pain Descriptors / Indicators: Aching;Operative site guarding;Sore Pain Intervention(s): Monitored during session    Home Living                      Prior Function            PT Goals (current goals can now  be found in the care plan section) Acute Rehab PT Goals Patient Stated Goal: to go home  PT Goal Formulation: With patient Time For Goal Achievement: 06/24/17 Potential to Achieve Goals: Good Progress towards PT goals: Progressing toward goals    Frequency    7X/week      PT Plan Current plan remains appropriate    Co-evaluation              AM-PAC PT "6 Clicks" Daily Activity  Outcome Measure  Difficulty turning over in bed (including adjusting bedclothes,  sheets and blankets)?: A Little Difficulty moving from lying on back to sitting on the side of the bed? : A Little Difficulty sitting down on and standing up from a chair with arms (e.g., wheelchair, bedside commode, etc,.)?: Total Help needed moving to and from a bed to chair (including a wheelchair)?: A Little Help needed walking in hospital room?: A Little Help needed climbing 3-5 steps with a railing? : A Little 6 Click Score: 16    End of Session Equipment Utilized During Treatment: Gait belt Activity Tolerance: Patient tolerated treatment well Patient left: in chair;with call bell/phone within reach;with family/visitor present Nurse Communication: Mobility status PT Visit Diagnosis: Other abnormalities of gait and mobility (R26.89);Pain Pain - Right/Left: Left Pain - part of body: Knee     Time: 0160-1093 PT Time Calculation (min) (ACUTE ONLY): 23 min  Charges:  $Gait Training: 8-22 mins $Therapeutic Exercise: 8-22 mins                    G Codes:      Benjiman Core, [place] Pager [phone] Acute Rehab    Allena Katz 06/18/2017, 9:15 AM

## 2017-06-18 NOTE — Progress Notes (Signed)
SPORTS MEDICINE AND JOINT REPLACEMENT  Lara Mulch, MD    Carlyon Shadow, PA-C [address], [place], [place]  [address]                             [phone]   PROGRESS NOTE  Subjective:  negative for Chest Pain  negative for Shortness of Breath  negative for Nausea/Vomiting   negative for Calf Pain  negative for Bowel Movement   Tolerating Diet: yes         Patient reports pain as 3 on 0-10 scale.    Objective: Vital signs in last 24 hours:   Patient Vitals for the past 24 hrs:  BP Temp Temp src Pulse Resp SpO2 Height Weight  06/18/17 0556 (!) 98/56 97.8 F (36.6 C) Oral (!) 55 18 93 % - -  06/18/17 0028 (!) 101/54 97.3 F (36.3 C) Oral (!) 53 18 90 % - -  06/17/17 2029 (!) 90/50 97.5 F (36.4 C) Oral (!) 59 15 92 % - -  06/17/17 1600 109/62 97.9 F (36.6 C) Oral (!) 57 - 93 % - -  06/17/17 1430 - - - (!) 57 12 97 % - -  06/17/17 1415 116/71 - - (!) 54 12 97 % - -  06/17/17 1400 112/71 98 F (36.7 C) - 72 15 98 % - -  06/17/17 1345 123/84 - - (!) 59 13 97 % - -  06/17/17 1330 121/83 - - 63 16 97 % - -  06/17/17 1315 - - - (!) 55 13 97 % - -  06/17/17 1300 - - - 67 12 97 % - -  06/17/17 1245 112/71 - - 67 13 96 % - -  06/17/17 1230 109/70 - - 62 12 96 % - -  06/17/17 1215 104/68 - - 67 11 95 % - -  06/17/17 1200 106/69 - - 68 12 96 % - -  06/17/17 1145 113/72 - - 71 11 97 % - -  06/17/17 1135 119/69 97.7 F (36.5 C) - 70 12 94 % - -  06/17/17 1130 - 97.8 F (36.6 C) - - 18 - - -  06/17/17 0917 132/66 - - (!) 51 17 94 % - -  06/17/17 0915 132/66 - - (!) 51 18 94 % - -  06/17/17 0910 - - - (!) 47 18 98 % - -  06/17/17 0905 - - - (!) 48 17 98 % - -  06/17/17 0742 - - - - - - 5\' 8"  (1.727 m) 73.5 kg (162 lb)  06/17/17 0717 128/63 97.8 F (36.6 C) Oral (!) 57 20 95 % - -    @flow {1959:LAST@   Intake/Output from previous day:   07/02 0701 - 07/03 0700 In: 2180 [P.O.:620; I.V.:1350] Out: 830 [Urine:680]   Intake/Output this shift:   No  intake/output data recorded.   Intake/Output      07/02 0701 - 07/03 0700 07/03 0701 - 07/04 0700   P.O. 620    I.V. (mL/kg) 1350 (18.4)    IV Piggyback 210    Total Intake(mL/kg) 2180 (29.7)    Urine (mL/kg/hr) 680 (0.4)    Blood 150 (0.1)    Total Output 830     Net +1350             LABORATORY DATA: No results for input(s): WBC, HGB, HCT, PLT in the last 168 hours. No results for  input(s): NA, K, CL, CO2, BUN, CREATININE, GLUCOSE, CALCIUM in the last 168 hours. No results found for: INR, PROTIME  Examination:  General appearance: alert and cooperative Extremities: extremities normal, atraumatic, no cyanosis or edema  Wound Exam: clean, dry, intact   Drainage:  None: wound tissue dry  Motor Exam: Quadriceps and Hamstrings Intact  Sensory Exam: Superficial Peroneal, Deep Peroneal and Tibial normal   Assessment:    1 Day Post-Op  Procedure(s) (LRB): TOTAL KNEE ARTHROPLASTY (Left)  ADDITIONAL DIAGNOSIS:  Active Problems:   S/P total knee replacement     Plan: Physical Therapy as ordered Weight Bearing as Tolerated (WBAT)  DVT Prophylaxis:  Aspirin  DISCHARGE PLAN: Home  DISCHARGE NEEDS: HHPT   Patient doing well, anticipate D/C home today once cleared by PT         Donia Ast 06/18/2017, 7:03 AM

## 2017-06-18 NOTE — Progress Notes (Signed)
06/18/17 1100  PT Visit Information  Last PT Received On 06/18/17  Assistance Needed +1  History of Present Illness Pt is a 71 y/o male s/p elective L TKA secondary to L knee OA. PMH includes BPH, chronic back pain s/p back surgery X 4 and spinal cord stimulator insertion.   Subjective Data  Patient Stated Goal to go home   Precautions  Precautions Knee  Precaution Booklet Issued No  Precaution Comments Reviewed seated ther ex with pt   Restrictions  Weight Bearing Restrictions Yes  LLE Weight Bearing WBAT  Pain Assessment  Pain Assessment Faces  Faces Pain Scale 2  Pain Location L knee  Pain Descriptors / Indicators Sore  Pain Intervention(s) Monitored during session  Cognition  Arousal/Alertness Awake/alert  Behavior During Therapy WFL for tasks assessed/performed  Overall Cognitive Status Within Functional Limits for tasks assessed  General Comments slightly impulsive  Bed Mobility  Overal bed mobility Modified Independent  General bed mobility comments Standing at bedside on arrival  Transfers  Overall transfer level Modified independent  Equipment used Rolling walker (2 wheeled)  Transfers Sit to/from Stand  Sit to Stand Modified independent (Device/Increase time)  General transfer comment no assist, no balance deficits noted  Ambulation/Gait  Ambulation/Gait assistance Modified independent (Device/Increase time)  Ambulation Distance (Feet) 510 Feet  Assistive device Rolling walker (2 wheeled)  Gait Pattern/deviations Step-through pattern  General Gait Details Overall steady gait. Pt c/o increased L LE pain with knee flexion during ambulation.  Gait velocity interpretation at or above normal speed for age/gender  Stairs Yes  Stairs assistance Min guard  Stair Management No rails;Step to pattern;Backwards;With walker  Number of Stairs 2  General stair comments Had pt teach back stair climbing technique to wife who was present for this session.  Balance  Overall  balance assessment No apparent balance deficits (not formally assessed)  Sitting-balance support No upper extremity supported;Feet supported  Sitting balance-Leahy Scale Good  Standing balance support Bilateral upper extremity supported;During functional activity  Standing balance-Leahy Scale Fair  Standing balance comment Reliant on UE support for balance   Exercises  Exercises Total Joint  PT - End of Session  Equipment Utilized During Treatment Gait belt  Activity Tolerance Patient tolerated treatment well  Patient left in chair;with call bell/phone within reach;with family/visitor present  Nurse Communication Mobility status  CPM Left Knee  CPM Left Knee Off  PT - Assessment/Plan  PT Plan Current plan remains appropriate  PT Visit Diagnosis Other abnormalities of gait and mobility (R26.89);Pain  Pain - Right/Left Left  Pain - part of body Knee  PT Frequency (ACUTE ONLY) 7X/week  Follow Up Recommendations DC plan and follow up therapy as arranged by surgeon;Supervision/Assistance - 24 hour  PT equipment Rolling walker with 5" wheels  AM-PAC PT "6 Clicks" Daily Activity Outcome Measure  Difficulty turning over in bed (including adjusting bedclothes, sheets and blankets)? 4  Difficulty moving from lying on back to sitting on the side of the bed?  4  Difficulty sitting down on and standing up from a chair with arms (e.g., wheelchair, bedside commode, etc,.)? 4  Help needed moving to and from a bed to chair (including a wheelchair)? 4  Help needed walking in hospital room? 4  Help needed climbing 3-5 steps with a railing?  3  6 Click Score 23  Mobility G Code  CI  Acute Rehab PT Goals  PT Goal Formulation With patient  Time For Goal Achievement 06/24/17  Potential to Achieve Goals  Good  PT Time Calculation  PT Start Time (ACUTE ONLY) 1109  PT Stop Time (ACUTE ONLY) 1121  PT Time Calculation (min) (ACUTE ONLY) 12 min  PT General Charges  $$ ACUTE PT VISIT 1 Procedure  PT  Treatments  $Gait Training 8-22 mins   Pt gait quality and speed has improved since previous session. He continues to progress with functional mobility. Cueing required for safety and walker management. Expected to d/c home this PM.  Benjiman Core, PTA Pager [phone] Acute Rehab

## 2017-06-18 NOTE — Care Management Note (Signed)
Case Management Note  Patient Details  Name: GIACOMO VALONE MRN: [id number] Date of Birth: [birth year]  Subjective/Objective:     71 yr old gentleman s/p left total knee arthroplasty.                Action/Plan: Case manager spoke with patient and his wife concering discharge plan and DME needs. Patient was preoperatively setup with [facility], no changes. He will have family assistance at discharge.    Expected Discharge Date:  06/18/17               Expected Discharge Plan:  [place]  In-House Referral:  NA  Discharge planning Services  CM Consult  Post Acute Care Choice:  Home Health, Durable Medical Equipment Choice offered to:  Patient  DME Arranged:  CPM DME Agency:  [employer]  HH Arranged:  PT HH Agency:  [facility] (formerly [employer])  Status of Service:  Completed, signed off  If discussed at [employer], dates discussed:    Additional Comments:  Ninfa Meeker, RN 06/18/2017, 4:05 PM

## 2017-06-18 NOTE — Op Note (Signed)
TOTAL KNEE REPLACEMENT OPERATIVE NOTE:  06/17/2017  4:32 PM  PATIENT:  Christopher [name]  71 y.o. male  PRE-OPERATIVE DIAGNOSIS:  primary osteoarthritis left knee  POST-OPERATIVE DIAGNOSIS:  primary osteoarthritis left knee  PROCEDURE:  Procedure(s): TOTAL KNEE ARTHROPLASTY  SURGEON:  Surgeon(s): [name], MD  PHYSICIAN ASSISTANT: Carlyon Shadow, [facility]   ANESTHESIA:   general  DRAINS: Hemovac  SPECIMEN: None  COUNTS:  Correct  TOURNIQUET:   Total Tourniquet Time Documented: Thigh (Left) - 40 minutes Total: Thigh (Left) - 40 minutes   DICTATION:  Indication for procedure:    The patient is a 71 y.o. male who has failed conservative treatment for primary osteoarthritis left knee.  Informed consent was obtained prior to anesthesia. The risks versus benefits of the operation were explain and in a way the patient can, and did, understand.   On the implant demand matching protocol, this patient scored 9.  Therefore, this patient was not receive a polyethylene insert with vitamin E which is a high demand implant.  Description of procedure:     The patient was taken to the operating room and placed under anesthesia.  The patient was positioned in the usual fashion taking care that all body parts were adequately padded and/or protected.  I foley catheter was not placed.  A tourniquet was applied and the leg prepped and draped in the usual sterile fashion.  The extremity was exsanguinated with the esmarch and tourniquet inflated to 350 mmHg.  Pre-operative range of motion was normal.  The knee was in 5 degree of mild varus.  A midline incision approximately 6-7 inches long was made with a #10 blade.  A new blade was used to make a parapatellar arthrotomy going 2-3 cm into the quadriceps tendon, over the [name], and alongside the medial aspect of the patellar tendon.  A synovectomy was then performed with the #10 blade and forceps. I then elevated the deep MCL off the medial tibial  metaphysis subperiosteally around to the semimembranosus attachment.    I everted the [name] and used calipers to measure patellar thickness.  I used the reamer to ream down to appropriate thickness to recreate the native thickness.  I then removed excess bone with the rongeur and sagittal saw.  I used the appropriately sized template and drilled the three lug holes.  I then put the trial in place and measured the thickness with the calipers to ensure recreation of the native thickness.  The trial was then removed and the [name] subluxed and the knee brought into flexion.  A homan retractor was place to retract and protect the [name] and lateral structures.  A Z-retractor was place medially to protect the medial structures.  The extra-medullary alignment system was used to make cut the tibial articular surface perpendicular to the anamotic axis of the tibia and in 3 degrees of posterior slope.  The cut surface and alignment jig was removed.  I then used the intramedullary alignment guide to make a 6 valgus cut on the distal femur.  I then marked out the epicondylar axis on the distal femur.  The posterior condylar axis measured 5 degrees.  I then used the anterior referencing sizer and measured the femur to be a size 6.  The 4-In-1 cutting block was screwed into place in external rotation matching the posterior condylar angle, making our cuts perpendicular to the epicondylar axis.  Anterior, posterior and chamfer cuts were made with the sagittal saw.  The cutting block and cut pieces  were removed.  A lamina spreader was placed in 90 degrees of flexion.  The ACL, PCL, menisci, and posterior condylar osteophytes were removed.  A 10 mm spacer blocked was found to offer good flexion and extension gap balance after mild in degree releasing.   The scoop retractor was then placed and the femoral finishing block was pinned in place.  The small sagittal saw was used as well as the lug drill to finish the femur.   The block and cut surfaces were removed and the medullary canal hole filled with autograft bone from the cut pieces.  The tibia was delivered forward in deep flexion and external rotation.  A size D tray was selected and pinned into place centered on the medial 1/3 of the tibial tubercle.  The reamer and keel was used to prepare the tibia through the tray.    I then trialed with the size 6 femur, size D tibia, a 10 mm insert and the 32 [name].  I had excellent flexion/extension gap balance, excellent [name] tracking.  Flexion was full and beyond 120 degrees; extension was zero.  These components were chosen and the staff opened them to me on the back table while the knee was lavaged copiously and the cement mixed.  The soft tissue was infiltrated with 60cc of exparel 1.3% through a 21 gauge needle.  I cemented in the components and removed all excess cement.  The polyethylene tibial component was snapped into place and the knee placed in extension while cement was hardening.  The capsule was infilltrated with 30cc of .25% Marcaine with epinephrine.  A hemovac was place in the joint exiting superolaterally.  A pain pump was place superomedially superficial to the arthrotomy.  Once the cement was hard, the tourniquet was let down.  Hemostasis was obtained.  The arthrotomy was closed with figure-8 #1 vicryl sutures.  The deep soft tissues were closed with #0 vicryls and the subcuticular layer closed with a running #2-0 vicryl.  The skin was reapproximated and closed with skin staples.  The wound was dressed with xeroform, 4 x4's, 2 ABD sponges, a single layer of webril and a TED stocking.   The patient was then awakened, extubated, and taken to the recovery room in stable condition.  BLOOD LOSS:  300cc DRAINS: 1 hemovac, 1 pain catheter COMPLICATIONS:  None.  PLAN OF CARE: Admit to inpatient   PATIENT DISPOSITION:  PACU - hemodynamically stable.   Delay start of Pharmacological VTE agent (>24hrs)  due to surgical blood loss or risk of bleeding:  not applicable  Please fax a copy of this op note to my office at [phone] (please only include page 1 and 2 of the Case Information op note)

## 2017-06-18 NOTE — Progress Notes (Addendum)
Bone foam wore from 2316-0600 Ice packs applied every 4 hours

## 2017-06-18 NOTE — Progress Notes (Signed)
Pt called RN into room and stated, "As I was washing up, my left knee gave away and it bent. I did not hit the floor though." Pt does complain of some pain. There was a nickel-sized blood drainage on the ace wrap upon inspection of his leg. [place], [place], was notified and he said to change the dressing, elevate legs and apply ice. Will continue to monitor patient.

## 2017-06-18 NOTE — Care Management Obs Status (Signed)
MEDICARE OBSERVATION STATUS NOTIFICATION   Patient Details  Name: Christopher [name] MRN: [id number] Date of Birth: [birth year]   Medicare Observation Status Notification Given:  Yes    Ninfa Meeker, RN 06/18/2017, 4:17 PM

## 2017-06-18 NOTE — Discharge Summary (Signed)
SPORTS MEDICINE & JOINT REPLACEMENT   Christopher Mulch, MD   Christopher Shadow, PA-C [place], [place], [place]  [address]                             [phone]  PATIENT ID: Christopher [name]        MRN:  [id number]          DOB/AGE: March 26, 1946 / 71 y.o.    DISCHARGE SUMMARY  ADMISSION DATE:    06/17/2017 DISCHARGE DATE:   06/18/2017   ADMISSION DIAGNOSIS: primary osteoarthritis left knee    DISCHARGE DIAGNOSIS:  primary osteoarthritis left knee    ADDITIONAL DIAGNOSIS: Active Problems:   S/P total knee replacement  Past Medical History:  Diagnosis Date  . Arthritis    "all over my body" (06/17/2017)  . BPH (benign prostatic hyperplasia)    takes Proscar daily  . Chronic back pain    DDD; "upper and lower" (06/17/2017)  . Constipation    takes Colace daily  . DDD (degenerative disc disease), cervical   . Enlarged prostate   . GERD (gastroesophageal reflux disease)    takes Nexium daily  . H/O hiatal hernia   . History of colon polyps    benign  . History of gastric ulcer   . Insomnia    takes Xanax nightly  . Joint pain   . Joint swelling   . Macular degeneration   . Muscle spasm of back    takes Valium daily as needed  . Peripheral neuropathy    takes Gabapentin daily  . PONV (postoperative nausea and vomiting)   . Urinary hesitancy   . Weakness    numbness and tingling    PROCEDURE: Procedure(s): TOTAL KNEE ARTHROPLASTY on 06/17/2017  CONSULTS:    HISTORY:  See H&P in chart  HOSPITAL COURSE:  Christopher [name] is a 71 y.o. admitted on 06/17/2017 and found to have a diagnosis of primary osteoarthritis left knee.  After appropriate laboratory studies were obtained  they were taken to the operating room on 06/17/2017 and underwent Procedure(s): TOTAL KNEE ARTHROPLASTY.   They were given perioperative antibiotics:  Anti-infectives    Start     Dose/Rate Route Frequency Ordered Stop   06/17/17 1600  ceFAZolin (ANCEF) IVPB 1 g/50 mL premix     1 g 100 mL/hr over 30  Minutes Intravenous Every 6 hours 06/17/17 1320 06/17/17 2148   06/17/17 0900  ceFAZolin (ANCEF) IVPB 2g/100 mL premix     2 g 200 mL/hr over 30 Minutes Intravenous To ShortStay Surgical 06/14/17 1320 06/17/17 0942    .  Patient given tranexamic acid IV or topical and exparel intra-operatively.  Tolerated the procedure well.    POD# 1: Vital signs were stable.  Patient denied Chest pain, shortness of breath, or calf pain.  Patient was started on Lovenox 30 mg subcutaneously twice daily at 8am.  Consults to PT, OT, and care management were made.  The patient was weight bearing as tolerated.  CPM was placed on the operative leg 0-90 degrees for 6-8 hours a day. When out of the CPM, patient was placed in the foam block to achieve full extension. Incentive spirometry was taught.  Dressing was changed.       POD #2, Continued  PT for ambulation and exercise program.  IV saline locked.  O2 discontinued.    The remainder of the hospital course was dedicated to ambulation  and strengthening.   The patient was discharged on 1 Day Post-Op in  Good condition.  Blood products given:none  DIAGNOSTIC STUDIES: Recent vital signs: Patient Vitals for the past 24 hrs:  BP Temp Temp src Pulse Resp SpO2  06/18/17 0845 106/66 98.6 F (37 C) Oral (!) 56 - 97 %  06/18/17 0556 (!) 98/56 97.8 F (36.6 C) Oral (!) 55 18 93 %  06/18/17 0028 (!) 101/54 97.3 F (36.3 C) Oral (!) 53 18 90 %  06/17/17 2029 (!) 90/50 97.5 F (36.4 C) Oral (!) 59 15 92 %  06/17/17 1600 109/62 97.9 F (36.6 C) Oral (!) 57 - 93 %       Recent laboratory studies:  Recent Labs  06/18/17 0541  WBC 13.3*  HGB 13.2  HCT 40.7  PLT 248    Recent Labs  06/18/17 0541  NA 137  K 4.0  CL 103  CO2 25  BUN 17  CREATININE 0.87  GLUCOSE 119*  CALCIUM 8.8*   No results found for: INR, PROTIME   Recent Radiographic Studies :  No results found.  DISCHARGE INSTRUCTIONS: Discharge Instructions    CPM    Complete by:  As  directed    Continuous passive motion machine (CPM):      Use the CPM from 0 to 90 for 4-6 hours per day.      You may increase by 10 per day.  You may break it up into 2 or 3 sessions per day.      Use CPM for 2 weeks or until you are told to stop.   Call MD / Call 911    Complete by:  As directed    If you experience chest pain or shortness of breath, CALL 911 and be transported to the hospital emergency room.  If you develope a fever above 101 F, pus (white drainage) or increased drainage or redness at the wound, or calf pain, call your surgeon's office.   Constipation Prevention    Complete by:  As directed    Drink plenty of fluids.  Prune juice may be helpful.  You may use a stool softener, such as Colace (over the counter) 100 mg twice a day.  Use MiraLax (over the counter) for constipation as needed.   Diet - low sodium heart healthy    Complete by:  As directed    Discharge instructions    Complete by:  As directed    INSTRUCTIONS AFTER JOINT REPLACEMENT   Remove items at home which could result in a fall. This includes throw rugs or furniture in walking pathways ICE to the affected joint every three hours while awake for 30 minutes at a time, for at least the first 3-5 days, and then as needed for pain and swelling.  Continue to use ice for pain and swelling. You may notice swelling that will progress down to the foot and ankle.  This is normal after surgery.  Elevate your leg when you are not up walking on it.   Continue to use the breathing machine you got in the hospital (incentive spirometer) which will help keep your temperature down.  It is common for your temperature to cycle up and down following surgery, especially at night when you are not up moving around and exerting yourself.  The breathing machine keeps your lungs expanded and your temperature down.   DIET:  As you were doing prior to hospitalization, we recommend a well-balanced diet.  DRESSING /  WOUND CARE /  SHOWERING  Keep the surgical dressing until follow up.  The dressing is water proof, so you can shower without any extra covering.  IF THE DRESSING FALLS OFF or the wound gets wet inside, change the dressing with sterile gauze.  Please use good hand washing techniques before changing the dressing.  Do not use any lotions or creams on the incision until instructed by your surgeon.    ACTIVITY  Increase activity slowly as tolerated, but follow the weight bearing instructions below.   No driving for 6 weeks or until further direction given by your physician.  You cannot drive while taking narcotics.  No lifting or carrying greater than 10 lbs. until further directed by your surgeon. Avoid periods of inactivity such as sitting longer than an hour when not asleep. This helps prevent blood clots.  You may return to work once you are authorized by your doctor.     WEIGHT BEARING   Weight bearing as tolerated with assist device (walker, cane, etc) as directed, use it as long as suggested by your surgeon or therapist, typically at least 4-6 weeks.   EXERCISES  Results after joint replacement surgery are often greatly improved when you follow the exercise, range of motion and muscle strengthening exercises prescribed by your doctor. Safety measures are also important to protect the joint from further injury. Any time any of these exercises cause you to have increased pain or swelling, decrease what you are doing until you are comfortable again and then slowly increase them. If you have problems or questions, call your caregiver or physical therapist for advice.   Rehabilitation is important following a joint replacement. After just a few days of immobilization, the muscles of the leg can become weakened and shrink (atrophy).  These exercises are designed to build up the tone and strength of the thigh and leg muscles and to improve motion. Often times heat used for twenty to thirty minutes before working  out will loosen up your tissues and help with improving the range of motion but do not use heat for the first two weeks following surgery (sometimes heat can increase post-operative swelling).   These exercises can be done on a training (exercise) mat, on the floor, on a table or on a bed. Use whatever works the best and is most comfortable for you.    Use music or television while you are exercising so that the exercises are a pleasant break in your day. This will make your life better with the exercises acting as a break in your routine that you can look forward to.   Perform all exercises about fifteen times, three times per day or as directed.  You should exercise both the operative leg and the other leg as well.   Exercises include:   Quad Sets - Tighten up the muscle on the front of the thigh (Quad) and hold for 5-10 seconds.   Straight Leg Raises - With your knee straight (if you were given a brace, keep it on), lift the leg to 60 degrees, hold for 3 seconds, and slowly lower the leg.  Perform this exercise against resistance later as your leg gets stronger.  Leg Slides: Lying on your back, slowly slide your foot toward your buttocks, bending your knee up off the floor (only go as far as is comfortable). Then slowly slide your foot back down until your leg is flat on the floor again.  Angel Wings: Lying on your back spread  your legs to the side as far apart as you can without causing discomfort.  Hamstring Strength:  Lying on your back, push your heel against the floor with your leg straight by tightening up the muscles of your buttocks.  Repeat, but this time bend your knee to a comfortable angle, and push your heel against the floor.  You may put a pillow under the heel to make it more comfortable if necessary.   A rehabilitation program following joint replacement surgery can speed recovery and prevent re-injury in the future due to weakened muscles. Contact your doctor or a physical therapist  for more information on knee rehabilitation.    CONSTIPATION  Constipation is defined medically as fewer than three stools per week and severe constipation as less than one stool per week.  Even if you have a regular bowel pattern at home, your normal regimen is likely to be disrupted due to multiple reasons following surgery.  Combination of anesthesia, postoperative narcotics, change in appetite and fluid intake all can affect your bowels.   YOU MUST use at least one of the following options; they are listed in order of increasing strength to get the job done.  They are all available over the counter, and you may need to use some, POSSIBLY even all of these options:    Drink plenty of fluids (prune juice may be helpful) and high fiber foods Colace 100 mg by mouth twice a day  Senokot for constipation as directed and as needed Dulcolax (bisacodyl), take with full glass of water  Miralax (polyethylene glycol) once or twice a day as needed.  If you have tried all these things and are unable to have a bowel movement in the first 3-4 days after surgery call either your surgeon or your primary doctor.    If you experience loose stools or diarrhea, hold the medications until you stool forms back up.  If your symptoms do not get better within 1 week or if they get worse, check with your doctor.  If you experience "the worst abdominal pain ever" or develop nausea or vomiting, please contact the office immediately for further recommendations for treatment.   ITCHING:  If you experience itching with your medications, try taking only a single pain pill, or even half a pain pill at a time.  You can also use Benadryl over the counter for itching or also to help with sleep.   TED HOSE STOCKINGS:  Use stockings on both legs until for at least 2 weeks or as directed by physician office. They may be removed at night for sleeping.  MEDICATIONS:  See your medication summary on the "After Visit Summary" that  nursing will review with you.  You may have some home medications which will be placed on hold until you complete the course of blood thinner medication.  It is important for you to complete the blood thinner medication as prescribed.  PRECAUTIONS:  If you experience chest pain or shortness of breath - call 911 immediately for transfer to the hospital emergency department.   If you develop a fever greater that 101 F, purulent drainage from wound, increased redness or drainage from wound, foul odor from the wound/dressing, or calf pain - CONTACT YOUR SURGEON.  FOLLOW-UP APPOINTMENTS:  If you do not already have a post-op appointment, please call the office for an appointment to be seen by your surgeon.  Guidelines for how soon to be seen are listed in your "After Visit Summary", but are typically between 1-4 weeks after surgery.  OTHER INSTRUCTIONS:   Knee Replacement:  Do not place pillow under knee, focus on keeping the knee straight while resting. CPM instructions: 0-90 degrees, 2 hours in the morning, 2 hours in the afternoon, and 2 hours in the evening. Place foam block, curve side up under heel at all times except when in CPM or when walking.  DO NOT modify, tear, cut, or change the foam block in any way.  MAKE SURE YOU:  Understand these instructions.  Get help right away if you are not doing well or get worse.    Thank you for letting us be a part of your medical care team.  It is a privilege we respect greatly.  We hope these instructions will help you stay on track for a fast and full recovery!   Increase activity slowly as tolerated    Complete by:  As directed       DISCHARGE MEDICATIONS:   Allergies as of 06/18/2017      Reactions   Morphine And Related Nausea And Vomiting   Propofol Nausea And Vomiting      Medication List    STOP taking these medications   HYDROcodone-acetaminophen 10-325 MG tablet Commonly known as:   NORCO   meloxicam 15 MG tablet Commonly known as:  MOBIC   naproxen sodium 220 MG tablet Commonly known as:  ANAPROX   OVER THE COUNTER MEDICATION   trolamine salicylate 10 % cream Commonly known as:  ASPERCREME     TAKE these medications   alfuzosin 10 MG 24 hr tablet Commonly known as:  UROXATRAL Take 10 mg by mouth daily with breakfast.   ALPRAZolam 1 MG tablet Commonly known as:  XANAX Take 1 mg by mouth at bedtime.   aspirin 325 MG EC tablet Take 1 tablet (325 mg total) by mouth 2 (two) times daily. What changed:  medication strength  how much to take  when to take this   cholecalciferol 1000 units tablet Commonly known as:  VITAMIN D Take 1,000 Units by mouth daily.   docusate sodium 100 MG capsule Commonly known as:  COLACE Take 200 mg by mouth daily.   esomeprazole 40 MG capsule Commonly known as:  NEXIUM Take 40 mg by mouth daily.   finasteride 5 MG tablet Commonly known as:  PROSCAR Take 5 mg by mouth daily.   Fish Oil 1000 MG Caps Take 1,000 mg by mouth daily.   gabapentin 400 MG capsule Commonly known as:  NEURONTIN Take 800 mg by mouth daily.   GNP GINGKO BILOBA EXTRACT PO Take 1 tablet by mouth daily.   methocarbamol 500 MG tablet Commonly known as:  ROBAXIN Take 1-2 tablets (500-1,000 mg total) by mouth every 6 (six) hours as needed for muscle spasms.   Oxycodone HCl 10 MG Tabs Take 0.5-1 tablets (5-10 mg total) by mouth every 3 (three) hours as needed for breakthrough pain.   SALONPAS PAIN RELIEF PATCH EX Apply 1-2 patches topically daily as needed (back pain).   SUPER B COMPLEX/C Caps Take 1 tablet by mouth daily.   tamsulosin 0.4 MG Caps capsule Commonly known as:  FLOMAX Take 0.4 mg by mouth daily.  Durable Medical Equipment        Start     Ordered   06/17/17 1529  DME Walker rolling  Once    Question:  Patient needs a walker to treat with the following condition  Answer:  S/P total knee replacement    06/17/17 1529   06/17/17 1529  DME 3 n 1  Once     06/17/17 1529   06/17/17 1529  DME Bedside commode  Once    Question:  Patient needs a bedside commode to treat with the following condition  Answer:  S/P total knee replacement   06/17/17 1529      FOLLOW UP VISIT:    DISPOSITION: HOME VS. SNF  CONDITION:  Good   Donia Ast 06/18/2017, 3:39 PM

## 2017-06-20 DIAGNOSIS — Z96659 Presence of unspecified artificial knee joint: Secondary | ICD-10-CM | POA: Insufficient documentation

## 2017-06-27 ENCOUNTER — Other Ambulatory Visit: Payer: Self-pay | Admitting: Orthopedic Surgery

## 2017-06-28 ENCOUNTER — Ambulatory Visit (HOSPITAL_COMMUNITY): Payer: Medicare Other

## 2017-06-28 ENCOUNTER — Encounter (HOSPITAL_COMMUNITY): Payer: Self-pay | Admitting: *Deleted

## 2017-06-28 MED ORDER — SODIUM CHLORIDE 0.9 % IV SOLN
1000.0000 mg | INTRAVENOUS | Status: AC
  Administered 2017-07-01: 1000 mg via INTRAVENOUS
  Filled 2017-06-28: qty 10

## 2017-06-28 MED ORDER — BUPIVACAINE LIPOSOME 1.3 % IJ SUSP
20.0000 mL | INTRAMUSCULAR | Status: AC
  Administered 2017-07-01: 20 mL
  Filled 2017-06-28: qty 20

## 2017-06-28 MED ORDER — ACETAMINOPHEN 500 MG PO TABS
1000.0000 mg | ORAL_TABLET | Freq: Once | ORAL | Status: AC
  Administered 2017-07-01: 1000 mg via ORAL
  Filled 2017-06-28: qty 2

## 2017-06-28 MED ORDER — DEXAMETHASONE SODIUM PHOSPHATE 10 MG/ML IJ SOLN
8.0000 mg | Freq: Once | INTRAMUSCULAR | Status: AC
  Administered 2017-07-01: 8 mg via INTRAVENOUS
  Filled 2017-06-28: qty 1

## 2017-06-28 MED ORDER — GABAPENTIN 300 MG PO CAPS
300.0000 mg | ORAL_CAPSULE | Freq: Once | ORAL | Status: AC
  Administered 2017-07-01: 300 mg via ORAL
  Filled 2017-06-28: qty 1

## 2017-06-28 MED ORDER — CEFAZOLIN SODIUM-DEXTROSE 2-4 GM/100ML-% IV SOLN
2.0000 g | INTRAVENOUS | Status: AC
  Administered 2017-07-01: 2 g via INTRAVENOUS
  Filled 2017-06-28: qty 100

## 2017-06-28 NOTE — Progress Notes (Signed)
Pt denies SOB, chest pain, and being under the care of a cardiologist. Pt stated that a stress test, echo and cardiac cath were all performed > 10 years ago. Pt denies having a chest x ray. Pt made aware to stop taking vitamins, fish oil, Glucosamine, and herbal medications. Do not take any NSAIDs ie: Ibuprofen, Advil, Naproxen (Aleve), Mobic, Motrin, BC and Goody Powder. Pt verbalized understanding of all pre-op instructions.

## 2017-06-28 NOTE — Progress Notes (Signed)
Danae Orleans, PA advised that pt be made aware to continue taking Aspirin. Pt verbalized understanding.

## 2017-06-28 NOTE — Progress Notes (Signed)
Left voice message on surgeons office phone regarding pre-op Aspirin instructions; awaiting a return call.

## 2017-06-30 NOTE — H&P (Signed)
CIRE CLUTE MRN:  [id number] DOB/SEX:  09-14-1946/male  CHIEF COMPLAINT:  Painful left Knee  HISTORY: Patient is a 71 y.o. male presented with a history of pain in the left knee. Onset of symptoms was abrupt starting a few days ago with gradually worsening course since that time. Prior procedures on the knee include arthroplasty. Patient has been treated conservatively with over-the-counter NSAIDs and activity modification. Patient currently rates pain in the knee at 10 out of 10 with activity. There is pain at night.  PAST MEDICAL HISTORY: Patient Active Problem List   Diagnosis Date Noted  . 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 Spondylolisthesis of lumbar region 03/05/2014  . Barrett esophagus 02/24/2013  . Personal history of colonic polyps 02/24/2013  . Epigastric pain 01/06/2013  . Encounter for screening colonoscopy 01/06/2013  . Pain in joint, shoulder region 07/09/2012  . Muscle weakness (generalized) 07/09/2012   Past Medical History:  Diagnosis Date  . Arthritis    "all over my body" (06/17/2017)  . BPH (benign prostatic hyperplasia)    takes Proscar daily  . Chronic back pain    DDD; "upper and lower" (06/17/2017)  . Constipation    takes Colace daily  . DDD (degenerative disc disease), cervical   . Enlarged prostate   . GERD (gastroesophageal reflux disease)    takes Nexium daily  . H/O hiatal hernia   . History of colon polyps    benign  . History of gastric ulcer   . Insomnia    takes Xanax nightly  . Joint capsule tear    left knee  . Joint pain   . Joint swelling   . Macular degeneration   . Muscle spasm of back    takes Valium daily as needed  . Peripheral neuropathy    takes Gabapentin daily  . PONV (postoperative nausea and vomiting)   . Urinary hesitancy   . Weakness    numbness and tingling   Past Surgical History:  Procedure Laterality Date  . ANTERIOR CERVICAL DECOMP/DISCECTOMY FUSION  ~ 1991   "took bone from hip"  . ANTERIOR CERVICAL  DECOMP/DISCECTOMY FUSION  X 2   "put plate in"  . ANTERIOR CERVICAL DISCECTOMY  <11/2005    5-6 and 4-5./notes 05/02/2011  . APPENDECTOMY    . BACK SURGERY    . CARDIAC CATHETERIZATION    . CARPAL TUNNEL RELEASE Bilateral 10/2005 - 11/2005   right-left Archie Endo 05/02/2011  . CATARACT EXTRACTION W/PHACO Right 05/24/2014   Procedure: CATARACT EXTRACTION PHACO AND INTRAOCULAR LENS PLACEMENT (IOC);  Surgeon: [name], MD;  Location: [facility];  Service: Ophthalmology;  Laterality: Right;  CDE 6.74  . CATARACT EXTRACTION W/PHACO Left 06/17/2014   Procedure: CATARACT EXTRACTION PHACO AND INTRAOCULAR LENS PLACEMENT (IOC);  Surgeon: [name], MD;  Location: [facility];  Service: Ophthalmology;  Laterality: Left;  CDE:4.65  . COLONOSCOPY WITH ESOPHAGOGASTRODUODENOSCOPY (EGD) N/A 01/22/2013   Procedure: COLONOSCOPY WITH ESOPHAGOGASTRODUODENOSCOPY (EGD);  Surgeon: [name], MD;  Location: [facility];  Service: Endoscopy;  Laterality: N/A;  140  . EYE SURGERY Bilateral    "cleaned film w/laser; since my 1st cataract OR"  . POSTERIOR LUMBAR FUSION     "put a plate in"  . SHOULDER ARTHROSCOPY Bilateral   . SPINAL CORD STIMULATOR INSERTION N/A 09/23/2015   Procedure: LUMBAR SPINAL CORD STIMULATOR INSERTION;  Surgeon: [name], MD;  Location: [facility];  Service: Neurosurgery;  Laterality: N/A;  LUMBAR SPINAL CORD STIMULATOR INSERTION  . TOTAL  KNEE ARTHROPLASTY Left 06/17/2017  . TOTAL KNEE ARTHROPLASTY Left 06/17/2017   Procedure: TOTAL KNEE ARTHROPLASTY;  Surgeon: [name], MD;  Location: [facility];  Service: Orthopedics;  Laterality: Left;     MEDICATIONS:   No prescriptions prior to admission.    ALLERGIES:   Allergies  Allergen Reactions  . Morphine And Related Nausea And Vomiting  . Propofol Nausea And Vomiting    REVIEW OF SYSTEMS:  A comprehensive review of systems was negative except for: Musculoskeletal: positive for bone pain, muscle weakness and myalgias   FAMILY HISTORY:   History reviewed. No pertinent family history.  SOCIAL HISTORY:   Social History  Substance Use Topics  . Smoking status: Former Smoker    Packs/day: 1.00    Years: 30.00    Types: Cigarettes  . Smokeless tobacco: Former Systems developer    Types: Chew     Comment: quit ismoking  & chewing by 1987  . Alcohol use 12.6 oz/week    9 Glasses of wine, 12 Cans of beer per week     Comment:  "none since surgery " 06/28/17     EXAMINATION:  Vital signs in last 24 hours:    There were no vitals taken for this visit.  General Appearance:    Alert, cooperative, no distress, appears stated age  Head:    Normocephalic, without obvious abnormality, atraumatic  Eyes:    PERRL, conjunctiva/corneas clear, EOM's intact, fundi    benign, both eyes       Ears:    Normal TM's and external ear canals, both ears  Nose:   Nares normal, septum midline, mucosa normal, no drainage    or sinus tenderness  Throat:   Lips, mucosa, and tongue normal; teeth and gums normal  Neck:   Supple, symmetrical, trachea midline, no adenopathy;       thyroid:  No enlargement/tenderness/nodules; no carotid   bruit or JVD  Back:     Symmetric, no curvature, ROM normal, no CVA tenderness  Lungs:     Clear to auscultation bilaterally, respirations unlabored  Chest wall:    No tenderness or deformity  Heart:    Regular rate and rhythm, S1 and S2 normal, no murmur, rub   or gallop  Abdomen:     Soft, non-tender, bowel sounds active all four quadrants,    no masses, no organomegaly  Genitalia:    Normal male without lesion, discharge or tenderness  Rectal:    Normal tone, normal prostate, no masses or tenderness;   guaiac negative stool  Extremities:   Extremities normal, atraumatic, no cyanosis or edema  Pulses:   2+ and symmetric all extremities  Skin:   Skin color, texture, turgor normal, no rashes or lesions  Lymph nodes:   Cervical, supraclavicular, and axillary nodes normal  Neurologic:   CNII-XII intact. Normal strength,  sensation and reflexes      throughout    Musculoskeletal:  ROM 0-75, Ligaments intact,  Imaging Review Plain radiographs demonstrate s/p tka of the left knee with lateral patellar tracking. The overall alignment is neutral. The bone quality appears to be good for age and reported activity level.  Assessment/Plan: S/p left tka with lateral patellar tracking, capsular tear  The patient history, physical examination and imaging studies are consistent with s/p tka left knee with capsular tear of the left knee. The patient has failed conservative treatment.  The clearance notes were reviewed.  After discussion with the patient it was felt that quad tendon/capsular repair  was indicated. The procedure,  risks, and benefits of capsular/quad tendon repair were presented and reviewed. The risks including but not limited to aseptic loosening, infection, blood clots, vascular injury, stiffness, patella tracking problems complications among others were discussed. The patient acknowledged the explanation, agreed to proceed with the plan.  Donia Ast 06/30/2017, 8:47 PM

## 2017-07-01 ENCOUNTER — Encounter (HOSPITAL_COMMUNITY): Payer: Self-pay

## 2017-07-01 ENCOUNTER — Encounter (HOSPITAL_COMMUNITY)
Admission: RE | Disposition: A | Discharge status: Home / Self Care | Payer: Self-pay | Source: Ambulatory Visit | Attending: Orthopedic Surgery

## 2017-07-01 ENCOUNTER — Ambulatory Visit (HOSPITAL_COMMUNITY): Payer: Medicare Other | Admitting: Anesthesiology

## 2017-07-01 ENCOUNTER — Observation Stay (HOSPITAL_COMMUNITY)
Admission: RE | Admit: 2017-07-01 | Discharge: 2017-07-02 | Disposition: A | Discharge status: Home / Self Care | Payer: Medicare Other | Source: Ambulatory Visit | Attending: Orthopedic Surgery | Admitting: Orthopedic Surgery

## 2017-07-01 DIAGNOSIS — N400 Enlarged prostate without lower urinary tract symptoms: Secondary | ICD-10-CM | POA: Diagnosis not present

## 2017-07-01 DIAGNOSIS — K219 Gastro-esophageal reflux disease without esophagitis: Secondary | ICD-10-CM | POA: Insufficient documentation

## 2017-07-01 DIAGNOSIS — Z9842 Cataract extraction status, left eye: Secondary | ICD-10-CM | POA: Insufficient documentation

## 2017-07-01 DIAGNOSIS — M545 Low back pain: Secondary | ICD-10-CM | POA: Diagnosis not present

## 2017-07-01 DIAGNOSIS — Z96652 Presence of left artificial knee joint: Secondary | ICD-10-CM | POA: Insufficient documentation

## 2017-07-01 DIAGNOSIS — Z961 Presence of intraocular lens: Secondary | ICD-10-CM | POA: Diagnosis not present

## 2017-07-01 DIAGNOSIS — Z79891 Long term (current) use of opiate analgesic: Secondary | ICD-10-CM | POA: Diagnosis not present

## 2017-07-01 DIAGNOSIS — Z885 Allergy status to narcotic agent status: Secondary | ICD-10-CM | POA: Insufficient documentation

## 2017-07-01 DIAGNOSIS — Z86010 Personal history of colonic polyps: Secondary | ICD-10-CM | POA: Diagnosis not present

## 2017-07-01 DIAGNOSIS — Z981 Arthrodesis status: Secondary | ICD-10-CM | POA: Diagnosis not present

## 2017-07-01 DIAGNOSIS — K449 Diaphragmatic hernia without obstruction or gangrene: Secondary | ICD-10-CM | POA: Insufficient documentation

## 2017-07-01 DIAGNOSIS — Z9889 Other specified postprocedural states: Secondary | ICD-10-CM | POA: Insufficient documentation

## 2017-07-01 DIAGNOSIS — Z87891 Personal history of nicotine dependence: Secondary | ICD-10-CM | POA: Insufficient documentation

## 2017-07-01 DIAGNOSIS — Z9841 Cataract extraction status, right eye: Secondary | ICD-10-CM | POA: Insufficient documentation

## 2017-07-01 DIAGNOSIS — S76112A Strain of left quadriceps muscle, fascia and tendon, initial encounter: Secondary | ICD-10-CM | POA: Diagnosis not present

## 2017-07-01 DIAGNOSIS — H3530 Unspecified macular degeneration: Secondary | ICD-10-CM | POA: Insufficient documentation

## 2017-07-01 DIAGNOSIS — Z888 Allergy status to other drugs, medicaments and biological substances status: Secondary | ICD-10-CM | POA: Insufficient documentation

## 2017-07-01 DIAGNOSIS — K2270 Barrett's esophagus without dysplasia: Secondary | ICD-10-CM | POA: Insufficient documentation

## 2017-07-01 DIAGNOSIS — Z8719 Personal history of other diseases of the digestive system: Secondary | ICD-10-CM | POA: Insufficient documentation

## 2017-07-01 DIAGNOSIS — Z7982 Long term (current) use of aspirin: Secondary | ICD-10-CM | POA: Insufficient documentation

## 2017-07-01 DIAGNOSIS — W19XXXA Unspecified fall, initial encounter: Secondary | ICD-10-CM | POA: Diagnosis not present

## 2017-07-01 DIAGNOSIS — Z96659 Presence of unspecified artificial knee joint: Secondary | ICD-10-CM

## 2017-07-01 DIAGNOSIS — M5030 Other cervical disc degeneration, unspecified cervical region: Secondary | ICD-10-CM | POA: Diagnosis not present

## 2017-07-01 DIAGNOSIS — Z79899 Other long term (current) drug therapy: Secondary | ICD-10-CM | POA: Diagnosis not present

## 2017-07-01 DIAGNOSIS — M4316 Spondylolisthesis, lumbar region: Secondary | ICD-10-CM | POA: Insufficient documentation

## 2017-07-01 DIAGNOSIS — G8929 Other chronic pain: Secondary | ICD-10-CM | POA: Diagnosis not present

## 2017-07-01 DIAGNOSIS — G629 Polyneuropathy, unspecified: Secondary | ICD-10-CM | POA: Diagnosis not present

## 2017-07-01 DIAGNOSIS — G4700 Insomnia, unspecified: Secondary | ICD-10-CM | POA: Insufficient documentation

## 2017-07-01 HISTORY — PX: QUADRICEPS TENDON REPAIR: SHX756

## 2017-07-01 HISTORY — DX: Other injury of unspecified body region, initial encounter: T14.8XXA

## 2017-07-01 LAB — CBC WITH DIFFERENTIAL/PLATELET
Basophils Absolute: 0 10*3/uL (ref 0.0–0.1)
Basophils Relative: 0 %
Eosinophils Absolute: 0.2 10*3/uL (ref 0.0–0.7)
Eosinophils Relative: 3 %
HCT: 39.1 % (ref 39.0–52.0)
Hemoglobin: 12.9 g/dL — ABNORMAL LOW (ref 13.0–17.0)
Lymphocytes Relative: 25 %
Lymphs Abs: 1.7 10*3/uL (ref 0.7–4.0)
MCH: 30.3 pg (ref 26.0–34.0)
MCHC: 33 g/dL (ref 30.0–36.0)
MCV: 91.8 fL (ref 78.0–100.0)
Monocytes Absolute: 0.5 10*3/uL (ref 0.1–1.0)
Monocytes Relative: 7 %
Neutro Abs: 4.5 10*3/uL (ref 1.7–7.7)
Neutrophils Relative %: 65 %
Platelets: 405 10*3/uL — ABNORMAL HIGH (ref 150–400)
RBC: 4.26 MIL/uL (ref 4.22–5.81)
RDW: 12.5 % (ref 11.5–15.5)
WBC: 6.9 10*3/uL (ref 4.0–10.5)

## 2017-07-01 SURGERY — REPAIR, TENDON, QUADRICEPS
Anesthesia: General | Site: Knee | Laterality: Left

## 2017-07-01 MED ORDER — ALPRAZOLAM 0.5 MG PO TABS
1.0000 mg | ORAL_TABLET | Freq: Every day | ORAL | Status: DC
  Administered 2017-07-01: 1 mg via ORAL
  Filled 2017-07-01: qty 2

## 2017-07-01 MED ORDER — SENNOSIDES-DOCUSATE SODIUM 8.6-50 MG PO TABS: 1.0000 | ORAL_TABLET | Freq: Every evening | ORAL | Status: DC | PRN

## 2017-07-01 MED ORDER — LACTATED RINGERS IV SOLN
INTRAVENOUS | Status: DC
  Administered 2017-07-01 (×2): via INTRAVENOUS

## 2017-07-01 MED ORDER — TRANEXAMIC ACID 1000 MG/10ML IV SOLN
1000.0000 mg | Freq: Once | INTRAVENOUS | Status: AC
  Administered 2017-07-01: 1000 mg via INTRAVENOUS
  Filled 2017-07-01 (×2): qty 10

## 2017-07-01 MED ORDER — SODIUM CHLORIDE 0.9 % IR SOLN
Status: DC | PRN
  Administered 2017-07-01: 3000 mL

## 2017-07-01 MED ORDER — ASPIRIN EC 325 MG PO TBEC
325.0000 mg | DELAYED_RELEASE_TABLET | Freq: Two times a day (BID) | ORAL | Status: DC
  Administered 2017-07-01 – 2017-07-02 (×2): 325 mg via ORAL
  Filled 2017-07-01 (×2): qty 1

## 2017-07-01 MED ORDER — METHOCARBAMOL 500 MG PO TABS
500.0000 mg | ORAL_TABLET | Freq: Four times a day (QID) | ORAL | Status: DC | PRN
  Administered 2017-07-01: 500 mg via ORAL
  Filled 2017-07-01: qty 1

## 2017-07-01 MED ORDER — EPHEDRINE 5 MG/ML INJ
INTRAVENOUS | Status: AC
  Filled 2017-07-01: qty 10

## 2017-07-01 MED ORDER — ETOMIDATE 2 MG/ML IV SOLN
INTRAVENOUS | Status: DC | PRN
  Administered 2017-07-01: 3 mg via INTRAVENOUS
  Administered 2017-07-01: 12 mg via INTRAVENOUS

## 2017-07-01 MED ORDER — GABAPENTIN 300 MG PO CAPS
300.0000 mg | ORAL_CAPSULE | Freq: Three times a day (TID) | ORAL | Status: DC
  Administered 2017-07-01 – 2017-07-02 (×3): 300 mg via ORAL
  Filled 2017-07-01 (×3): qty 1

## 2017-07-01 MED ORDER — TAMSULOSIN HCL 0.4 MG PO CAPS
0.4000 mg | ORAL_CAPSULE | Freq: Every day | ORAL | Status: DC
  Administered 2017-07-01 – 2017-07-02 (×2): 0.4 mg via ORAL
  Filled 2017-07-01 (×2): qty 1

## 2017-07-01 MED ORDER — FENTANYL CITRATE (PF) 100 MCG/2ML IJ SOLN
25.0000 ug | INTRAMUSCULAR | Status: DC | PRN
Start: 2017-07-01 — End: 2017-07-01
  Administered 2017-07-01: 50 ug via INTRAVENOUS

## 2017-07-01 MED ORDER — ONDANSETRON HCL 4 MG/2ML IJ SOLN: 4.0000 mg | Freq: Four times a day (QID) | INTRAMUSCULAR | Status: DC | PRN

## 2017-07-01 MED ORDER — LIDOCAINE 2% (20 MG/ML) 5 ML SYRINGE
INTRAMUSCULAR | Status: DC | PRN
  Administered 2017-07-01: 60 mg via INTRAVENOUS

## 2017-07-01 MED ORDER — FENTANYL CITRATE (PF) 250 MCG/5ML IJ SOLN
INTRAMUSCULAR | Status: AC
  Filled 2017-07-01: qty 5

## 2017-07-01 MED ORDER — PANTOPRAZOLE SODIUM 40 MG PO TBEC
40.0000 mg | DELAYED_RELEASE_TABLET | Freq: Every day | ORAL | Status: DC
Start: 2017-07-01 — End: 2017-07-02
  Administered 2017-07-01 – 2017-07-02 (×2): 40 mg via ORAL
  Filled 2017-07-01 (×2): qty 1

## 2017-07-01 MED ORDER — ONDANSETRON HCL 4 MG/2ML IJ SOLN: 4.0000 mg | Freq: Once | INTRAMUSCULAR | Status: DC | PRN

## 2017-07-01 MED ORDER — FENTANYL CITRATE (PF) 250 MCG/5ML IJ SOLN
INTRAMUSCULAR | Status: DC | PRN
  Administered 2017-07-01: 25 ug via INTRAVENOUS
  Administered 2017-07-01: 75 ug via INTRAVENOUS

## 2017-07-01 MED ORDER — SODIUM CHLORIDE 0.9 % IV SOLN
2000.0000 mg | INTRAVENOUS | Status: AC
  Administered 2017-07-01: 2000 mg via TOPICAL
  Filled 2017-07-01: qty 20

## 2017-07-01 MED ORDER — METOCLOPRAMIDE HCL 5 MG/ML IJ SOLN: 5.0000 mg | Freq: Three times a day (TID) | INTRAMUSCULAR | Status: DC | PRN

## 2017-07-01 MED ORDER — ACETAMINOPHEN 325 MG PO TABS: 650.0000 mg | ORAL_TABLET | Freq: Four times a day (QID) | ORAL | Status: DC | PRN

## 2017-07-01 MED ORDER — EPINEPHRINE PF 1 MG/ML IJ SOLN
INTRAMUSCULAR | Status: AC
  Filled 2017-07-01: qty 1

## 2017-07-01 MED ORDER — CELECOXIB 200 MG PO CAPS
200.0000 mg | ORAL_CAPSULE | Freq: Two times a day (BID) | ORAL | Status: DC
  Administered 2017-07-01 – 2017-07-02 (×3): 200 mg via ORAL
  Filled 2017-07-01 (×3): qty 1

## 2017-07-01 MED ORDER — FENTANYL CITRATE (PF) 100 MCG/2ML IJ SOLN
100.0000 ug | Freq: Once | INTRAMUSCULAR | Status: AC
  Administered 2017-07-01: 100 ug via INTRAVENOUS
  Filled 2017-07-01: qty 2

## 2017-07-01 MED ORDER — ETOMIDATE 2 MG/ML IV SOLN
INTRAVENOUS | Status: AC
  Filled 2017-07-01: qty 10

## 2017-07-01 MED ORDER — CEFAZOLIN SODIUM-DEXTROSE 1-4 GM/50ML-% IV SOLN
1.0000 g | Freq: Four times a day (QID) | INTRAVENOUS | Status: AC
  Administered 2017-07-01 – 2017-07-02 (×2): 1 g via INTRAVENOUS
  Filled 2017-07-01 (×2): qty 50

## 2017-07-01 MED ORDER — DIPHENHYDRAMINE HCL 50 MG/ML IJ SOLN
INTRAMUSCULAR | Status: AC
  Filled 2017-07-01: qty 1

## 2017-07-01 MED ORDER — ONDANSETRON HCL 4 MG/2ML IJ SOLN
INTRAMUSCULAR | Status: DC | PRN
  Administered 2017-07-01: 4 mg via INTRAVENOUS

## 2017-07-01 MED ORDER — ONDANSETRON HCL 4 MG PO TABS: 4.0000 mg | ORAL_TABLET | Freq: Four times a day (QID) | ORAL | Status: DC | PRN

## 2017-07-01 MED ORDER — ZOLPIDEM TARTRATE 5 MG PO TABS: 5.0000 mg | ORAL_TABLET | Freq: Every evening | ORAL | Status: DC | PRN

## 2017-07-01 MED ORDER — FLEET ENEMA 7-19 GM/118ML RE ENEM: 1.0000 | ENEMA | Freq: Once | RECTAL | Status: DC | PRN

## 2017-07-01 MED ORDER — BISACODYL 5 MG PO TBEC: 5.0000 mg | DELAYED_RELEASE_TABLET | Freq: Every day | ORAL | Status: DC | PRN

## 2017-07-01 MED ORDER — ROPIVACAINE HCL 5 MG/ML IJ SOLN
INTRAMUSCULAR | Status: DC | PRN
  Administered 2017-07-01: 30 mL via PERINEURAL

## 2017-07-01 MED ORDER — DOCUSATE SODIUM 100 MG PO CAPS
100.0000 mg | ORAL_CAPSULE | Freq: Two times a day (BID) | ORAL | Status: DC
  Administered 2017-07-01 – 2017-07-02 (×3): 100 mg via ORAL
  Filled 2017-07-01 (×3): qty 1

## 2017-07-01 MED ORDER — ONDANSETRON HCL 4 MG/2ML IJ SOLN
INTRAMUSCULAR | Status: AC
  Filled 2017-07-01: qty 2

## 2017-07-01 MED ORDER — BUPIVACAINE-EPINEPHRINE 0.25% -1:200000 IJ SOLN
INTRAMUSCULAR | Status: DC | PRN
  Administered 2017-07-01: 30 mL

## 2017-07-01 MED ORDER — MIDAZOLAM HCL 2 MG/2ML IJ SOLN
2.0000 mg | Freq: Once | INTRAMUSCULAR | Status: DC
  Filled 2017-07-01: qty 2

## 2017-07-01 MED ORDER — LIDOCAINE HCL (CARDIAC) 20 MG/ML IV SOLN
INTRAVENOUS | Status: AC
  Filled 2017-07-01: qty 5

## 2017-07-01 MED ORDER — DEXAMETHASONE SODIUM PHOSPHATE 10 MG/ML IJ SOLN
10.0000 mg | Freq: Once | INTRAMUSCULAR | Status: AC
  Administered 2017-07-02: 10 mg via INTRAVENOUS
  Filled 2017-07-01: qty 1

## 2017-07-01 MED ORDER — PHENOL 1.4 % MT LIQD: 1.0000 | OROMUCOSAL | Status: DC | PRN

## 2017-07-01 MED ORDER — SCOPOLAMINE 1 MG/3DAYS TD PT72
MEDICATED_PATCH | TRANSDERMAL | Status: AC
  Filled 2017-07-01: qty 1

## 2017-07-01 MED ORDER — MENTHOL 3 MG MT LOZG: 1.0000 | LOZENGE | OROMUCOSAL | Status: DC | PRN

## 2017-07-01 MED ORDER — 0.9 % SODIUM CHLORIDE (POUR BTL) OPTIME
TOPICAL | Status: DC | PRN
  Administered 2017-07-01: 1000 mL

## 2017-07-01 MED ORDER — METHOCARBAMOL 1000 MG/10ML IJ SOLN
500.0000 mg | Freq: Four times a day (QID) | INTRAMUSCULAR | Status: DC | PRN
  Filled 2017-07-01: qty 5

## 2017-07-01 MED ORDER — FENTANYL CITRATE (PF) 100 MCG/2ML IJ SOLN
INTRAMUSCULAR | Status: AC
  Filled 2017-07-01: qty 2

## 2017-07-01 MED ORDER — POVIDONE-IODINE 10 % EX SWAB
2.0000 "application " | Freq: Once | CUTANEOUS | Status: AC
  Administered 2017-07-01: 2 via TOPICAL

## 2017-07-01 MED ORDER — ALUM & MAG HYDROXIDE-SIMETH 200-200-20 MG/5ML PO SUSP
30.0000 mL | ORAL | Status: DC | PRN
Start: 2017-07-01 — End: 2017-07-02

## 2017-07-01 MED ORDER — EPHEDRINE SULFATE-NACL 50-0.9 MG/10ML-% IV SOSY
PREFILLED_SYRINGE | INTRAVENOUS | Status: DC | PRN
  Administered 2017-07-01 (×2): 5 mg via INTRAVENOUS

## 2017-07-01 MED ORDER — MIDAZOLAM HCL 2 MG/2ML IJ SOLN
INTRAMUSCULAR | Status: AC
  Filled 2017-07-01: qty 2

## 2017-07-01 MED ORDER — CHLORHEXIDINE GLUCONATE 4 % EX LIQD: 60.0000 mL | Freq: Once | CUTANEOUS | Status: DC

## 2017-07-01 MED ORDER — METOCLOPRAMIDE HCL 5 MG PO TABS: 5.0000 mg | ORAL_TABLET | Freq: Three times a day (TID) | ORAL | Status: DC | PRN

## 2017-07-01 MED ORDER — DIPHENHYDRAMINE HCL 50 MG/ML IJ SOLN
INTRAMUSCULAR | Status: DC | PRN
  Administered 2017-07-01: 12.5 mg via INTRAVENOUS

## 2017-07-01 MED ORDER — DEXAMETHASONE SODIUM PHOSPHATE 10 MG/ML IJ SOLN
INTRAMUSCULAR | Status: AC
  Filled 2017-07-01: qty 1

## 2017-07-01 MED ORDER — BUPIVACAINE HCL (PF) 0.25 % IJ SOLN
INTRAMUSCULAR | Status: AC
  Filled 2017-07-01: qty 30

## 2017-07-01 MED ORDER — OXYCODONE HCL 5 MG PO TABS: 5.0000 mg | ORAL_TABLET | ORAL | Status: DC | PRN

## 2017-07-01 MED ORDER — MIDAZOLAM HCL 5 MG/5ML IJ SOLN
INTRAMUSCULAR | Status: DC | PRN
  Administered 2017-07-01 (×2): 1 mg via INTRAVENOUS

## 2017-07-01 MED ORDER — ALFUZOSIN HCL ER 10 MG PO TB24
10.0000 mg | ORAL_TABLET | Freq: Every day | ORAL | Status: DC
  Administered 2017-07-01 – 2017-07-02 (×2): 10 mg via ORAL
  Filled 2017-07-01 (×2): qty 1

## 2017-07-01 MED ORDER — DIPHENHYDRAMINE HCL 12.5 MG/5ML PO ELIX: 12.5000 mg | ORAL_SOLUTION | ORAL | Status: DC | PRN

## 2017-07-01 MED ORDER — HYDROMORPHONE HCL 1 MG/ML IJ SOLN
1.0000 mg | INTRAMUSCULAR | Status: DC | PRN
  Administered 2017-07-01: 1 mg via INTRAVENOUS
  Filled 2017-07-01: qty 1

## 2017-07-01 MED ORDER — HYDROCODONE-ACETAMINOPHEN 7.5-325 MG PO TABS
1.0000 | ORAL_TABLET | Freq: Four times a day (QID) | ORAL | Status: DC
  Administered 2017-07-01 – 2017-07-02 (×4): 1 via ORAL
  Filled 2017-07-01 (×4): qty 1

## 2017-07-01 MED ORDER — ACETAMINOPHEN 650 MG RE SUPP: 650.0000 mg | Freq: Four times a day (QID) | RECTAL | Status: DC | PRN

## 2017-07-01 MED ORDER — SCOPOLAMINE 1 MG/3DAYS TD PT72SCOPOLAMINE 1 MG/3DAYS
MEDICATED_PATCH | TRANSDERMAL | Status: DC | PRN
Start: 2017-07-01 — End: 2017-07-01
  Administered 2017-07-01: 1 via TRANSDERMAL

## 2017-07-01 MED ORDER — HYDROGEN PEROXIDE 3 % EX SOLN
CUTANEOUS | Status: DC | PRN
  Administered 2017-07-01: 1

## 2017-07-01 SURGICAL SUPPLY — 55 items
BAG DECANTER FOR FLEXI CONT (MISCELLANEOUS) ×2 IMPLANT
BANDAGE ACE 6X5 VEL STRL LF (GAUZE/BANDAGES/DRESSINGS) ×2 IMPLANT
BANDAGE ESMARK 6X9 LF (GAUZE/BANDAGES/DRESSINGS) IMPLANT
BNDG COHESIVE 6X5 TAN STRL LF (GAUZE/BANDAGES/DRESSINGS) ×2 IMPLANT
BNDG ESMARK 6X9 LF (GAUZE/BANDAGES/DRESSINGS)
CLSR STERI-STRIP ANTIMIC 1/2X4 (GAUZE/BANDAGES/DRESSINGS) ×2 IMPLANT
COVER SURGICAL LIGHT HANDLE (MISCELLANEOUS) ×2 IMPLANT
CUFF TOURNIQUET SINGLE 34IN LL (TOURNIQUET CUFF) IMPLANT
DECANTER SPIKE VIAL GLASS SM (MISCELLANEOUS) ×2 IMPLANT
DRAPE HALF SHEET 40X57 (DRAPES) ×2 IMPLANT
DRAPE INCISE IOBAN 66X45 STRL (DRAPES) ×6 IMPLANT
DRAPE U-SHAPE 47X51 STRL (DRAPES) ×2 IMPLANT
DRSG AQUACEL AG ADV 3.5X10 (GAUZE/BANDAGES/DRESSINGS) ×2 IMPLANT
ELECT CAUTERY BLADE 6.4 (BLADE) IMPLANT
ELECT REM PT RETURN 9FT ADLT (ELECTROSURGICAL)
ELECTRODE REM PT RTRN 9FT ADLT (ELECTROSURGICAL) IMPLANT
GAUZE SPONGE 4X4 12PLY STRL (GAUZE/BANDAGES/DRESSINGS) ×2 IMPLANT
GAUZE XEROFORM 1X8 LF (GAUZE/BANDAGES/DRESSINGS) ×2 IMPLANT
GLOVE BIOGEL PI IND STRL 7.5 (GLOVE) IMPLANT
GLOVE BIOGEL PI IND STRL 8.5 (GLOVE) ×5 IMPLANT
GLOVE BIOGEL PI INDICATOR 7.5 (GLOVE)
GLOVE BIOGEL PI INDICATOR 8.5 (GLOVE) ×5
GLOVE SURG ORTHO 7.0 STRL STRW (GLOVE) IMPLANT
GLOVE SURG ORTHO 8.0 STRL STRW (GLOVE) ×12 IMPLANT
GOWN STRL REUS W/ TWL LRG LVL3 (GOWN DISPOSABLE) ×3 IMPLANT
GOWN STRL REUS W/TWL 2XL LVL3 (GOWN DISPOSABLE) ×2 IMPLANT
GOWN STRL REUS W/TWL LRG LVL3 (GOWN DISPOSABLE) ×3
HOOD PEEL AWAY FACE SHEILD DIS (HOOD) ×4 IMPLANT
KIT ROOM TURNOVER OR (KITS) ×2 IMPLANT
MANIFOLD NEPTUNE II (INSTRUMENTS) ×2 IMPLANT
NEEDLE 18GX1X1/2 (RX/OR ONLY) (NEEDLE) ×2 IMPLANT
NEEDLE 22X1 1/2 (OR ONLY) (NEEDLE) ×4 IMPLANT
NEEDLE FILTER BLUNT 18X 1/2SAF (NEEDLE) ×1
NEEDLE FILTER BLUNT 18X1 1/2 (NEEDLE) ×1 IMPLANT
PACK ORTHO EXTREMITY (CUSTOM PROCEDURE TRAY) ×2 IMPLANT
PACK UNIVERSAL I (CUSTOM PROCEDURE TRAY) ×2 IMPLANT
PAD ARMBOARD 7.5X6 YLW CONV (MISCELLANEOUS) ×4 IMPLANT
PASSER SUT SWANSON 36MM LOOP (INSTRUMENTS) IMPLANT
PENCIL BUTTON HOLSTER BLD 10FT (ELECTRODE) IMPLANT
SPONGE LAP 4X18 X RAY DECT (DISPOSABLE) ×2 IMPLANT
STOCKINETTE IMPERVIOUS LG (DRAPES) ×2 IMPLANT
SUT FIBERWIRE #2 38 T-5 BLUE (SUTURE) ×2
SUT MNCRL AB 3-0 PS2 18 (SUTURE) IMPLANT
SUT VIC AB 0 CT1 27 (SUTURE) ×1
SUT VIC AB 0 CT1 27XBRD ANBCTR (SUTURE) ×1 IMPLANT
SUT VIC AB 2-0 CT1 27 (SUTURE) ×2
SUT VIC AB 2-0 CT1 TAPERPNT 27 (SUTURE) ×2 IMPLANT
SUTURE FIBERWR #2 38 T-5 BLUE (SUTURE) ×1 IMPLANT
SYR 20CC LL (SYRINGE) ×4 IMPLANT
SYR TB 1ML LUER SLIP (SYRINGE) ×2 IMPLANT
TOWEL OR 17X24 6PK STRL BLUE (TOWEL DISPOSABLE) ×2 IMPLANT
TOWEL OR 17X26 10 PK STRL BLUE (TOWEL DISPOSABLE) ×2 IMPLANT
TUBE CONNECTING 12X1/4 (SUCTIONS) ×2 IMPLANT
WATER STERILE IRR 1000ML POUR (IV SOLUTION) ×2 IMPLANT
YANKAUER SUCT BULB TIP NO VENT (SUCTIONS) ×4 IMPLANT

## 2017-07-01 NOTE — Anesthesia Procedure Notes (Signed)
Procedure Name: LMA Insertion Date/Time: 07/01/2017 12:29 PM Performed by: Freddie Breech Pre-anesthesia Checklist: Patient identified, Emergency Drugs available, Suction available and Patient being monitored Patient Re-evaluated:Patient Re-evaluated prior to induction Oxygen Delivery Method: Circle System Utilized Preoxygenation: Pre-oxygenation with 100% oxygen Induction Type: IV induction Ventilation: Mask ventilation without difficulty LMA: LMA inserted LMA Size: 4.0 Number of attempts: 1 Airway Equipment and Method: Bite block Placement Confirmation: positive ETCO2 Tube secured with: Tape Dental Injury: Teeth and Oropharynx as per pre-operative assessment

## 2017-07-01 NOTE — Transfer of Care (Signed)
Immediate Anesthesia Transfer of Care Note  Patient: Christopher [name]  Procedure(s) Performed: Procedure(s): REPAIR QUADRICEP TENDON (Left)  Patient Location: PACU  Anesthesia Type:GA combined with regional for post-op pain  Level of Consciousness: drowsy and patient cooperative  Airway & Oxygen Therapy: Patient Spontanous Breathing and Patient connected to face mask oxygen  Post-op Assessment: Report given to RN and Post -op Vital signs reviewed and stable  Post vital signs: Reviewed and stable  Last Vitals:  Vitals:   07/01/17 1150 07/01/17 1359  BP:    Pulse: (!) 56   Resp: 15   Temp:  (!) 36.3 C    Last Pain:  Vitals:   07/01/17 0845  TempSrc: Oral      Patients Stated Pain Goal: 4 (93/96/88 6484)  Complications: No apparent anesthesia complications

## 2017-07-01 NOTE — Progress Notes (Signed)
Orthopedic Tech Progress Note Patient Details:  SERGEI DELO 01-28-1946 [id number]  CPM Left Knee CPM Left Knee: On Left Knee Flexion (Degrees): 90 Left Knee Extension (Degrees): 0 Additional Comments: trapeze bar patient helper   Hildred Priest 07/01/2017, 2:39 PM Viewed order from doctor's order list

## 2017-07-01 NOTE — Evaluation (Signed)
Physical Therapy Evaluation Patient Details Name: Christopher [name] MRN: [id number] DOB: 02-26-1946 Today's Date: 07/01/2017   History of Present Illness  pt is a 71 y/o male with redo of L TKA after recent post op fall.  PMH:  DDD, macular degeneration, ACDF, lumbar fusion/stimulator.  Clinical Impression  Pt admitted with/for redo L TKA.  Pt currently limited functionally due to the problems listed below.  (see problems list.)  Pt will benefit from PT to maximize function and safety to be able to get home safely with available assist of family.     Follow Up Recommendations DC plan and follow up therapy as arranged by surgeon    Equipment Recommendations       Recommendations for Other Services       Precautions / Restrictions Precautions Precautions: Knee Precaution Comments: review intitial ther ex with pt Restrictions Weight Bearing Restrictions: Yes LLE Weight Bearing: Weight bearing as tolerated      Mobility  Bed Mobility Overal bed mobility: Modified Independent                Transfers Overall transfer level: Needs assistance Equipment used: Rolling walker (2 wheeled) Transfers: Sit to/from Stand Sit to Stand: Supervision         General transfer comment: no assist, no balance deficits noted  Ambulation/Gait Ambulation/Gait assistance: Supervision Ambulation Distance (Feet): 200 Feet Assistive device: Rolling walker (2 wheeled) Gait Pattern/deviations: Step-through pattern Gait velocity: Decreased Gait velocity interpretation: Below normal speed for age/gender General Gait Details: steady gait with decrease heel toe pattern  Stairs            Wheelchair Mobility    Modified Rankin (Stroke Patients Only)       Balance Overall balance assessment: No apparent balance deficits (not formally assessed)                                           Pertinent Vitals/Pain Pain Assessment: 0-10 Pain Score: 0-No pain Pain  Intervention(s): Premedicated before session    Home Living Family/patient expects to be discharged to:: Private residence Living Arrangements: Spouse/significant other Available Help at Discharge: Family;Available 24 hours/day Type of Home: House Home Access: Stairs to enter;Ramped entrance Entrance Stairs-Rails: None Entrance Stairs-Number of Steps: 3 Home Layout: One level Home Equipment: Walker - 4 wheels;Bedside commode;Crutches;Cane - single point;Hospital bed      Prior Function Level of Independence: Independent         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     Hand Dominance   Dominant Hand: Right    Extremity/Trunk Assessment        Lower Extremity Assessment Lower Extremity Assessment: Overall WFL for tasks assessed (mild R knee instability)    Cervical / Trunk Assessment Cervical / Trunk Assessment: Other exceptions Cervical / Trunk Exceptions: PMH of back surgery  Communication   Communication: No difficulties  Cognition Arousal/Alertness: Awake/alert Behavior During Therapy: WFL for tasks assessed/performed Overall Cognitive Status: Within Functional Limits for tasks assessed                                        General Comments      Exercises Total Joint Exercises Ankle Circles/Pumps: AROM;Both;15 reps;Supine Quad Sets: AROM;Left;Supine;10 reps Heel Slides: AROM;Left;10 reps;Supine Goniometric ROM: 85   Assessment/Plan  PT Assessment Patient needs continued PT services  PT Problem List Decreased strength;Decreased range of motion;Decreased balance;Decreased mobility;Decreased knowledge of use of DME;Decreased knowledge of precautions;Pain       PT Treatment Interventions DME instruction;Gait training;Stair training;Functional mobility training;Therapeutic activities;Therapeutic exercise;Balance training;Neuromuscular re-education;Patient/family education    PT Goals (Current goals can be found in the Care Plan  section)  Acute Rehab PT Goals Patient Stated Goal: to go home  PT Goal Formulation: With patient Time For Goal Achievement: 07/08/17 Potential to Achieve Goals: Good    Frequency 7X/week   Barriers to discharge        Co-evaluation               AM-PAC PT "6 Clicks" Daily Activity  Outcome Measure Difficulty turning over in bed (including adjusting bedclothes, sheets and blankets)?: A Little Difficulty moving from lying on back to sitting on the side of the bed? : A Little Difficulty sitting down on and standing up from a chair with arms (e.g., wheelchair, bedside commode, etc,.)?: A Little Help needed moving to and from a bed to chair (including a wheelchair)?: A Little Help needed walking in hospital room?: A Little Help needed climbing 3-5 steps with a railing? : A Little 6 Click Score: 18    End of Session   Activity Tolerance: Patient tolerated treatment well Patient left: in chair;with call bell/phone within reach Nurse Communication: Mobility status PT Visit Diagnosis: Other abnormalities of gait and mobility (R26.89) Pain - Right/Left: Left Pain - part of body: Knee    Time: 1725-1800 PT Time Calculation (min) (ACUTE ONLY): 35 min   Charges:   PT Evaluation $PT Eval Low Complexity: 1 Procedure PT Treatments $Gait Training: 8-22 mins   PT G Codes:   PT G-Codes **NOT FOR INPATIENT CLASS** Functional Assessment Tool Used: AM-PAC 6 Clicks Basic Mobility;Clinical judgement Functional Limitation: Mobility: Walking and moving around Mobility: Walking and Moving Around Current Status (B0962): At least 1 percent but less than 20 percent impaired, limited or restricted Mobility: Walking and Moving Around Goal Status [phone]): 0 percent impaired, limited or restricted    07/01/2017  Donnella Sham, PT [phone]  (pager)  Christopher [name] 07/01/2017, 6:09 PM

## 2017-07-01 NOTE — Progress Notes (Signed)
Orthopedic Tech Progress Note Patient Details:  Christopher [name] [birth year] [id number]  CPM Left Knee CPM Left Knee: On Left Knee Flexion (Degrees): 60 Left Knee Extension (Degrees): 0 Additional Comments: Left knee   [name] 07/01/2017, 9:52 PM

## 2017-07-01 NOTE — Anesthesia Procedure Notes (Signed)
Anesthesia Regional Block: Adductor canal block   Pre-Anesthetic Checklist: ,, timeout performed, Correct Patient, Correct Site, Correct Laterality, Correct Procedure,, site marked, risks and benefits discussed, Surgical consent,  Pre-op evaluation,  At surgeon's request and post-op pain management  Laterality: Left  Prep: chloraprep       Needles:  Injection technique: Single-shot  Needle Type: Echogenic Stimulator Needle     Needle Length: 9cm  Needle Gauge: 21     Additional Needles:   Procedures: ultrasound guided,,,,,,,,  Narrative:  Start time: 07/01/2017 11:00 AM End time: 07/01/2017 11:10 AM Injection made incrementally with aspirations every 5 mL.  Performed by: Personally  Anesthesiologist: Adele Barthel P  Additional Notes: Functioning IV was confirmed and monitors were applied.  A 80mm 21ga Arrow echogenic stimulator needle was used. Sterile prep,hand hygiene and sterile gloves were used.  Negative aspiration and negative test dose prior to incremental administration of local anesthetic. The patient tolerated the procedure well.

## 2017-07-01 NOTE — Progress Notes (Signed)
Pt admitted to the unit from pacu; pt A &O x4; VSS; CPM; SCD's on; IV intact and transfusing; pt oriented to the unit and room; spouse at bedside; fall/safety precaution/prevention education completed. Both voices understanding. Pt skin clean, dry and intact with no pressure ulcer or opened wounds noted except for surgical incision dsg to LLE. Compression wrap dsg clean, dry and intact with no stain or drainage noted. Call light within reach and will closely monitor pt. Delia Heady RN

## 2017-07-01 NOTE — Op Note (Signed)
Dictation Number: [id number]

## 2017-07-01 NOTE — Anesthesia Preprocedure Evaluation (Addendum)
Anesthesia Evaluation  Patient identified by MRN, date of birth, ID band Patient awake    Reviewed: Allergy & Precautions, NPO status , Patient's Chart, lab work & pertinent test results  History of Anesthesia Complications (+) PONV and history of anesthetic complications  Airway Mallampati: I       Dental  (+) Edentulous Lower, Edentulous Upper   Pulmonary former smoker,    Pulmonary exam normal breath sounds clear to auscultation       Cardiovascular negative cardio ROS Normal cardiovascular exam Rate:Normal  ECG: SR, rate 51   Neuro/Psych negative psych ROS   GI/Hepatic Neg liver ROS, hiatal hernia, GERD  Medicated and Controlled,  Endo/Other  negative endocrine ROS  Renal/GU negative Renal ROS  negative genitourinary   Musculoskeletal   Abdominal Normal abdominal exam  (+)   Peds  Hematology  (+) anemia ,   Anesthesia Other Findings Chronic pain  Reproductive/Obstetrics                            Anesthesia Physical  Anesthesia Plan  ASA: II  Anesthesia Plan: General   Post-op Pain Management:  Regional for Post-op pain   Induction: Intravenous  PONV Risk Score and Plan: 3 and Ondansetron, Dexamethasone, Propofol and Midazolam  Airway Management Planned: LMA  Additional Equipment:   Intra-op Plan:   Post-operative Plan:   Informed Consent: I have reviewed the patients History and Physical, chart, labs and discussed the procedure including the risks, benefits and alternatives for the proposed anesthesia with the patient or authorized representative who has indicated his/her understanding and acceptance.   Dental advisory given  Plan Discussed with: CRNA and Surgeon  Anesthesia Plan Comments:        Anesthesia Quick Evaluation

## 2017-07-02 ENCOUNTER — Encounter (HOSPITAL_COMMUNITY): Payer: Self-pay | Admitting: Orthopedic Surgery

## 2017-07-02 DIAGNOSIS — S76112A Strain of left quadriceps muscle, fascia and tendon, initial encounter: Secondary | ICD-10-CM | POA: Diagnosis not present

## 2017-07-02 LAB — CBC
HCT: 32.7 % — ABNORMAL LOW (ref 39.0–52.0)
Hemoglobin: 10.8 g/dL — ABNORMAL LOW (ref 13.0–17.0)
MCH: 30.4 pg (ref 26.0–34.0)
MCHC: 33 g/dL (ref 30.0–36.0)
MCV: 92.1 fL (ref 78.0–100.0)
Platelets: 350 10*3/uL (ref 150–400)
RBC: 3.55 MIL/uL — ABNORMAL LOW (ref 4.22–5.81)
RDW: 12.3 % (ref 11.5–15.5)
WBC: 13.2 10*3/uL — ABNORMAL HIGH (ref 4.0–10.5)

## 2017-07-02 LAB — BASIC METABOLIC PANEL
Anion gap: 9 (ref 5–15)
BUN: 13 mg/dL (ref 6–20)
CO2: 22 mmol/L (ref 22–32)
Calcium: 8.9 mg/dL (ref 8.9–10.3)
Chloride: 103 mmol/L (ref 101–111)
Creatinine, Ser: 0.83 mg/dL (ref 0.61–1.24)
GFR calc Af Amer: >60 mL/min (ref >60)
GFR calc non Af Amer: >60 mL/min (ref >60)
Glucose, Bld: 138 mg/dL — ABNORMAL HIGH (ref 65–99)
Potassium: 4.1 mmol/L (ref 3.5–5.1)
Sodium: 134 mmol/L — ABNORMAL LOW (ref 135–145)

## 2017-07-02 NOTE — Care Management Obs Status (Signed)
MEDICARE OBSERVATION STATUS NOTIFICATION   Patient Details  Name: Christopher [name] MRN: [id number] Date of Birth: 10-03-1946   Medicare Observation Status Notification Given:  Yes    Ninfa Meeker, RN 07/02/2017, 12:30 PM

## 2017-07-02 NOTE — Anesthesia Postprocedure Evaluation (Signed)
Anesthesia Post Note  Patient: OREL COOLER  Procedure(s) Performed: Procedure(s) (LRB): REPAIR QUADRICEP TENDON (Left)     Patient location during evaluation: PACU Anesthesia Type: General and Regional Level of consciousness: awake and alert Pain management: pain level controlled Vital Signs Assessment: post-procedure vital signs reviewed and stable Respiratory status: spontaneous breathing, nonlabored ventilation, respiratory function stable and patient connected to nasal cannula oxygen Cardiovascular status: blood pressure returned to baseline and stable Postop Assessment: no signs of nausea or vomiting Anesthetic complications: no    Last Vitals:  Vitals:   07/02/17 0100 07/02/17 0537  BP: 100/60 100/62  Pulse: 71 66  Resp: 18 20  Temp: 36.6 C 36.4 C    Last Pain:  Vitals:   07/02/17 1103  TempSrc:   PainSc: 3                  Ryan P Ellender

## 2017-07-02 NOTE — Progress Notes (Signed)
SPORTS MEDICINE AND JOINT REPLACEMENT  Lara Mulch, MD    Carlyon Shadow, PA-C [address], [place], [place]  [address]                             [phone]   PROGRESS NOTE  Subjective:  negative for Chest Pain  negative for Shortness of Breath  negative for Nausea/Vomiting   negative for Calf Pain  negative for Bowel Movement   Tolerating Diet: yes         Patient reports pain as 3 on 0-10 scale.    Objective: Vital signs in last 24 hours:   Patient Vitals for the past 24 hrs:  BP Temp Temp src Pulse Resp SpO2 Height Weight  07/02/17 0537 100/62 97.6 F (36.4 C) Oral 66 20 94 % - -  07/02/17 0100 100/60 97.8 F (36.6 C) Oral 71 18 95 % - -  07/01/17 2041 117/67 (!) 97.5 F (36.4 C) Oral (!) 56 18 98 % - -  07/01/17 1542 - - - - - 95 % - -  07/01/17 1537 116/68 98.3 F (36.8 C) Oral 66 - 95 % - -  07/01/17 1519 - (!) 97.1 F (36.2 C) - 67 19 96 % - -  07/01/17 1513 112/61 - - 68 15 96 % - -  07/01/17 1500 - - - 70 14 96 % - -  07/01/17 1456 110/65 - - 72 14 95 % - -  07/01/17 1441 (!) 125/55 - - 67 17 95 % - -  07/01/17 1430 - - - 70 11 94 % - -  07/01/17 1415 - - - 70 16 100 % - -  07/01/17 1359 (!) 100/51 (!) 97.4 F (36.3 C) - 80 16 100 % - -  07/01/17 1150 - - - (!) 56 15 95 % - -  07/01/17 1140 - - - (!) 57 15 98 % - -  07/01/17 1130 - - - (!) 56 10 97 % - -  07/01/17 1120 (!) 121/56 - - 61 (!) 21 97 % - -  07/01/17 1110 116/79 - - 63 15 98 % - -  07/01/17 1100 - - - 62 20 98 % - -  07/01/17 1050 - - - 60 18 98 % - -  07/01/17 0845 122/77 98 F (36.7 C) Oral 70 18 99 % 5\' 8"  (1.727 m) 73.5 kg (162 lb)    @flow {1959:LAST@   Intake/Output from previous day:   07/16 0701 - 07/17 0700 In: 1342.5 [I.V.:1292.5] Out: 570 [Urine:550]   Intake/Output this shift:   No intake/output data recorded.   Intake/Output      07/16 0701 - 07/17 0700 07/17 0701 - 07/18 0700   I.V. (mL/kg) 1292.5 (17.6)    IV Piggyback 50    Total Intake(mL/kg) 1342.5  (18.3)    Urine (mL/kg/hr) 550    Blood 20    Total Output 570     Net +772.5          Urine Occurrence 1 x       LABORATORY DATA:  Recent Labs  07/01/17 0901 07/02/17 0503  WBC 6.9 13.2*  HGB 12.9* 10.8*  HCT 39.1 32.7*  PLT 405* 350    Recent Labs  07/02/17 0503  NA 134*  K 4.1  CL 103  CO2 22  BUN 13  CREATININE 0.83  GLUCOSE 138*  CALCIUM 8.9   No  results found for: INR, PROTIME  Examination:  General appearance: alert, cooperative and no distress Extremities: extremities normal, atraumatic, no cyanosis or edema  Wound Exam: clean, dry, intact   Drainage:  None: wound tissue dry  Motor Exam: Quadriceps and Hamstrings Intact  Sensory Exam: Superficial Peroneal, Deep Peroneal and Tibial normal   Assessment:    1 Day Post-Op  Procedure(s) (LRB): REPAIR QUADRICEP TENDON (Left)  ADDITIONAL DIAGNOSIS:  Active Problems:   S/P total knee replacement    Plan: Physical Therapy as ordered Weight Bearing as Tolerated (WBAT)  DVT Prophylaxis:  Aspirin  DISCHARGE PLAN: Home  DISCHARGE NEEDS: HHPT         Donia Ast 07/02/2017, 7:46 AM

## 2017-07-02 NOTE — Evaluation (Signed)
Occupational Therapy Evaluation Patient Details Name: Christopher [name] MRN: [id number] DOB: [birth year] Today's Date: 07/02/2017    History of Present Illness pt is a 71 y/o male with redo of L TKA after recent post op fall.  PMH:  DDD, macular degeneration, ACDF, lumbar fusion/stimulator.   Clinical Impression   Patient evaluated by Occupational Therapy with no further acute OT needs identified. All education has been completed and the patient has no further questions. See below for any follow-up Occupational Therapy or equipment needs. OT to sign off. Thank you for referral.      Follow Up Recommendations  No OT follow up;Supervision - Intermittent    Equipment Recommendations  None recommended by OT    Recommendations for Other Services       Precautions / Restrictions Precautions Precautions: Knee Precaution Booklet Issued: Yes (comment) Precaution Comments: Provided handout and reviewed. Restrictions Weight Bearing Restrictions: Yes LLE Weight Bearing: Weight bearing as tolerated      Mobility Bed Mobility Overal bed mobility: Modified Independent;Needs Assistance Bed Mobility: Supine to Sit;Sit to Supine     Supine to sit: Modified independent (Device/Increase time) Sit to supine: Modified independent (Device/Increase time)   General bed mobility comments: No assist needed. HOB flat, no use of rails.   Transfers Overall transfer level: Needs assistance Equipment used: Rolling walker (2 wheeled) Transfers: Sit to/from Stand Sit to Stand: Supervision         General transfer comment: no assist, no balance deficits noted. Stood from [employer], from chair x2.    Balance Overall balance assessment: Needs assistance Sitting-balance support: No upper extremity supported;Feet supported Sitting balance-Leahy Scale: Good     Standing balance support: During functional activity Standing balance-Leahy Scale: Poor Standing balance comment: Reliant on UE support for  balance                            ADL either performed or assessed with clinical judgement   ADL Overall ADL's : Needs assistance/impaired Eating/Feeding: Independent   Grooming: Independent   Upper Body Bathing: Independent   Lower Body Bathing: Minimal assistance   Upper Body Dressing : Independent   Lower Body Dressing: Minimal assistance   Toilet Transfer: Supervision/safety             General ADL Comments: wife reports he can do it all himself but then wife threads underwear and shorts over feet . pt then completed bed mobility to pull up LB clothing  Pt educated on bathing and avoid washing directly on incision. Pt educated to use new wash cloth and towel each day. Pt educated to allow water to run across dressing and not to soak in a tub at this time     Vision Baseline Vision/History: Wears glasses Wears Glasses: At all times       Perception     Praxis      Pertinent Vitals/Pain Pain Assessment: Faces Pain Score: 5  Faces Pain Scale: Hurts even more Pain Location: L knee Pain Descriptors / Indicators: Sore Pain Intervention(s): Monitored during session;Premedicated before session;Repositioned;Ice applied     Hand Dominance Right   Extremity/Trunk Assessment Upper Extremity Assessment Upper Extremity Assessment: Generalized weakness   Lower Extremity Assessment Lower Extremity Assessment: Defer to PT evaluation LLE Deficits / Details: numbness in the calf. Deficits consistent with post op pain and weakness. Able to perform exercises below.    Cervical / Trunk Assessment Cervical / Trunk Assessment: Other exceptions  Cervical / Trunk Exceptions: PMH of back surgery   Communication Communication Communication: No difficulties   Cognition Arousal/Alertness: Awake/alert Behavior During Therapy: WFL for tasks assessed/performed Overall Cognitive Status: Within Functional Limits for tasks assessed                                  General Comments: slightly impulsive   General Comments       Exercises Total Joint Exercises Ankle Circles/Pumps: AROM;Both;15 reps;Supine Quad Sets: Supine;10 reps;Both;Strengthening Towel Squeeze: Both;Supine;10 reps;Strengthening Heel Slides: Left;AROM;Supine;Strengthening Hip ABduction/ADduction: Left;10 reps;Supine;Strengthening Knee Flexion: AROM;Left;5 reps;Seated Goniometric ROM: 5-100 degrees knee AROM   Shoulder Instructions      Home Living Family/patient expects to be discharged to:: Private residence Living Arrangements: Spouse/significant other Available Help at Discharge: Family;Available 24 hours/day Type of Home: House Home Access: Ramped entrance Entrance Stairs-Number of Steps: 3 Entrance Stairs-Rails: None Home Layout: One level     Bathroom Shower/Tub: Occupational psychologist: Standard     Home Equipment: Environmental consultant - 4 wheels;Bedside commode;Crutches;Cane - single point;Hospital bed          Prior Functioning/Environment Level of Independence: Independent        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        OT Problem List:        OT Treatment/Interventions:      OT Goals(Current goals can be found in the care plan section) Acute Rehab OT Goals Patient Stated Goal: to go home  OT Goal Formulation: All assessment and education complete, DC therapy  OT Frequency:     Barriers to D/C:            Co-evaluation              AM-PAC PT "6 Clicks" Daily Activity     Outcome Measure Help from another person eating meals?: None Help from another person taking care of personal grooming?: None Help from another person toileting, which includes using toliet, bedpan, or urinal?: None Help from another person bathing (including washing, rinsing, drying)?: None Help from another person to put on and taking off regular upper body clothing?: None Help from another person to put on and taking off regular lower body  clothing?: A Little 6 Click Score: 23   End of Session Equipment Utilized During Treatment: Rolling walker CPM Left Knee CPM Left Knee: Off Nurse Communication: Mobility status;Precautions (desire to leave by lunch)  Activity Tolerance: Patient tolerated treatment well Patient left: with call bell/phone within reach;in bed;with family/visitor present  OT Visit Diagnosis: Other abnormalities of gait and mobility (R26.89)                Time: 8242-3536 OT Time Calculation (min): 22 min Charges:  OT General Charges $OT Visit: 1 Procedure OT Evaluation $OT Eval Moderate Complexity: 1 Procedure G-Codes: OT G-codes **NOT FOR INPATIENT CLASS** Functional Assessment Tool Used: AM-PAC 6 Clicks Daily Activity Functional Limitation: Self care Self Care Current Status (R4431): 0 percent impaired, limited or restricted Self Care Goal Status (V4008): 0 percent impaired, limited or restricted Self Care Discharge Status (Q7619): 0 percent impaired, limited or restricted    Jeri Modena   OTR/L Pager: [phone] Office: [phone] .   Parke Poisson B 07/02/2017, 11:56 AM

## 2017-07-02 NOTE — Progress Notes (Signed)
Physical Therapy Treatment Patient Details Name: Christopher [name] MRN: [id number] DOB: 25-Jan-1946 Today's Date: 07/02/2017    History of Present Illness pt is a 71 y/o male with redo of L TKA after recent post op fall.  PMH:  DDD, macular degeneration, ACDF, lumbar fusion/stimulator.    PT Comments    Patient progressing well towards PT goals. Tolerated stair training and gait training with supervision for safety. Provided handout and instructed pt in exercises. Pain is pt's biggest limiting factor. Pt plans to d/c home with support of g/f. Knee AROM 5-100 degrees. Will plan to follow if still in hospital.     Follow Up Recommendations  DC plan and follow up therapy as arranged by surgeon     Equipment Recommendations  Rolling walker with 5" wheels    Recommendations for Other Services       Precautions / Restrictions Precautions Precautions: Knee Precaution Booklet Issued: Yes (comment) Precaution Comments: Provided handout and reviewed. Restrictions Weight Bearing Restrictions: Yes LLE Weight Bearing: Weight bearing as tolerated    Mobility  Bed Mobility Overal bed mobility: Modified Independent;Needs Assistance Bed Mobility: Supine to Sit;Sit to Supine     Supine to sit: Modified independent (Device/Increase time) Sit to supine: Modified independent (Device/Increase time)   General bed mobility comments: No assist needed. HOB flat, no use of rails.   Transfers Overall transfer level: Needs assistance Equipment used: Rolling walker (2 wheeled) Transfers: Sit to/from Stand Sit to Stand: Supervision         General transfer comment: no assist, no balance deficits noted. Stood from [employer], from chair x2.  Ambulation/Gait Ambulation/Gait assistance: Supervision Ambulation Distance (Feet): 250 Feet Assistive device: Rolling walker (2 wheeled) Gait Pattern/deviations: Step-through pattern;Decreased stride length Gait velocity: Decreased   General Gait Details:  Steady gait with cues for knee extension during stance phase of gait. No knee buckling.   Stairs Stairs: Yes   Stair Management: No rails;Step to pattern;Backwards;With walker Number of Stairs: 2 General stair comments: Cues for technique and to stabilize RW on stairs.   Wheelchair Mobility    Modified Rankin (Stroke Patients Only)       Balance Overall balance assessment: Needs assistance Sitting-balance support: No upper extremity supported;Feet supported Sitting balance-Leahy Scale: Good     Standing balance support: During functional activity Standing balance-Leahy Scale: Poor Standing balance comment: Reliant on UE support for balance                             Cognition Arousal/Alertness: Awake/alert Behavior During Therapy: WFL for tasks assessed/performed Overall Cognitive Status: Within Functional Limits for tasks assessed                                 General Comments: slightly impulsive      Exercises Total Joint Exercises Ankle Circles/Pumps: AROM;Both;15 reps;Supine Quad Sets: Supine;10 reps;Both;Strengthening Towel Squeeze: Both;Supine;10 reps;Strengthening Heel Slides: Left;AROM;Supine;Strengthening Hip ABduction/ADduction: Left;10 reps;Supine;Strengthening Knee Flexion: AROM;Left;5 reps;Seated Goniometric ROM: 5-100 degrees knee AROM    General Comments        Pertinent Vitals/Pain Pain Assessment: 0-10 Pain Score: 5  Pain Location: L knee Pain Descriptors / Indicators: Sore Pain Intervention(s): Monitored during session;Repositioned;Premedicated before session;Limited activity within patient's tolerance    Home Living                      Prior  Function            PT Goals (current goals can now be found in the care plan section) Progress towards PT goals: Progressing toward goals    Frequency    7X/week      PT Plan Current plan remains appropriate    Co-evaluation               AM-PAC PT "6 Clicks" Daily Activity  Outcome Measure  Difficulty turning over in bed (including adjusting bedclothes, sheets and blankets)?: None Difficulty moving from lying on back to sitting on the side of the bed? : None Difficulty sitting down on and standing up from a chair with arms (e.g., wheelchair, bedside commode, etc,.)?: None Help needed moving to and from a bed to chair (including a wheelchair)?: A Little Help needed walking in hospital room?: A Little Help needed climbing 3-5 steps with a railing? : A Little 6 Click Score: 21    End of Session Equipment Utilized During Treatment: Gait belt Activity Tolerance: Patient tolerated treatment well Patient left: in bed;with call bell/phone within reach Nurse Communication: Mobility status PT Visit Diagnosis: Other abnormalities of gait and mobility (R26.89) Pain - Right/Left: Left Pain - part of body: Knee     Time: 6808-8110 PT Time Calculation (min) (ACUTE ONLY): 29 min  Charges:  $Gait Training: 8-22 mins $Therapeutic Exercise: 8-22 mins                    G Codes:       Wray Kearns, PT, DPT [phone]     Christopher [name] 07/02/2017, 10:46 AM

## 2017-07-02 NOTE — Discharge Summary (Signed)
SPORTS MEDICINE & JOINT REPLACEMENT   Lara Mulch, MD   Carlyon Shadow, PA-C [address], [place], [place]  [address]                             [phone]  PATIENT ID: Christopher [name]        MRN:  [id number]          DOB/AGE: February 24, 1946 / 71 y.o.    DISCHARGE SUMMARY  ADMISSION DATE:    07/01/2017 DISCHARGE DATE:   07/02/2017   ADMISSION DIAGNOSIS: left knee quad tendon tear     DISCHARGE DIAGNOSIS:  left knee quadriceps tendon tear     ADDITIONAL DIAGNOSIS: Active Problems:   S/P total knee replacement  Past Medical History:  Diagnosis Date  . Arthritis    "all over my body" (06/17/2017)  . BPH (benign prostatic hyperplasia)    takes Proscar daily  . Chronic back pain    DDD; "upper and lower" (06/17/2017)  . Constipation    takes Colace daily  . DDD (degenerative disc disease), cervical   . Enlarged prostate   . GERD (gastroesophageal reflux disease)    takes Nexium daily  . H/O hiatal hernia   . History of colon polyps    benign  . History of gastric ulcer   . Insomnia    takes Xanax nightly  . Joint capsule tear    left knee  . Joint pain   . Joint swelling   . Macular degeneration   . Muscle spasm of back    takes Valium daily as needed  . Peripheral neuropathy    takes Gabapentin daily  . PONV (postoperative nausea and vomiting)   . Urinary hesitancy   . Weakness    numbness and tingling    PROCEDURE: Procedure(s): REPAIR QUADRICEP TENDON on 07/01/2017  CONSULTS:    HISTORY:  See H&P in chart  HOSPITAL COURSE:  Christopher [name] is a 71 y.o. admitted on 07/01/2017 and found to have a diagnosis of left knee quadriceps tendon tear .  After appropriate laboratory studies were obtained  they were taken to the operating room on 07/01/2017 and underwent Procedure(s): REPAIR QUADRICEP TENDON.   They were given perioperative antibiotics:  Anti-infectives    Start     Dose/Rate Route Frequency Ordered Stop   07/01/17 1800  ceFAZolin (ANCEF) IVPB 1 g/50  mL premix     1 g 100 mL/hr over 30 Minutes Intravenous Every 6 hours 07/01/17 1541 07/02/17 0143   07/01/17 1015  ceFAZolin (ANCEF) IVPB 2g/100 mL premix     2 g 200 mL/hr over 30 Minutes Intravenous To ShortStay Surgical 06/28/17 1251 07/01/17 1234    .  Patient given tranexamic acid IV or topical and exparel intra-operatively.  Tolerated the procedure well.    POD# 1: Vital signs were stable.  Patient denied Chest pain, shortness of breath, or calf pain.  Patient was started on Lovenox 30 mg subcutaneously twice daily at 8am.  Consults to PT, OT, and care management were made.  The patient was weight bearing as tolerated.  CPM was placed on the operative leg 0-90 degrees for 6-8 hours a day. When out of the CPM, patient was placed in the foam block to achieve full extension. Incentive spirometry was taught.  Dressing was changed.       POD #2, Continued  PT for ambulation and exercise program.  IV saline locked.  O2 discontinued.    The remainder of the hospital course was dedicated to ambulation and strengthening.   The patient was discharged on 1 Day Post-Op in  Good condition.  Blood products given:none  DIAGNOSTIC STUDIES: Recent vital signs: Patient Vitals for the past 24 hrs:  BP Temp Temp src Pulse Resp SpO2 Height Weight  07/02/17 0537 100/62 97.6 F (36.4 C) Oral 66 20 94 % - -  07/02/17 0100 100/60 97.8 F (36.6 C) Oral 71 18 95 % - -  07/01/17 2041 117/67 (!) 97.5 F (36.4 C) Oral (!) 56 18 98 % - -  07/01/17 1542 - - - - - 95 % - -  07/01/17 1537 116/68 98.3 F (36.8 C) Oral 66 - 95 % - -  07/01/17 1519 - (!) 97.1 F (36.2 C) - 67 19 96 % - -  07/01/17 1513 112/61 - - 68 15 96 % - -  07/01/17 1500 - - - 70 14 96 % - -  07/01/17 1456 110/65 - - 72 14 95 % - -  07/01/17 1441 (!) 125/55 - - 67 17 95 % - -  07/01/17 1430 - - - 70 11 94 % - -  07/01/17 1415 - - - 70 16 100 % - -  07/01/17 1359 (!) 100/51 (!) 97.4 F (36.3 C) - 80 16 100 % - -  07/01/17 1150 - -  - (!) 56 15 95 % - -  07/01/17 1140 - - - (!) 57 15 98 % - -  07/01/17 1130 - - - (!) 56 10 97 % - -  07/01/17 1120 (!) 121/56 - - 61 (!) 21 97 % - -  07/01/17 1110 116/79 - - 63 15 98 % - -  07/01/17 1100 - - - 62 20 98 % - -  07/01/17 1050 - - - 60 18 98 % - -  07/01/17 0845 122/77 98 F (36.7 C) Oral 70 18 99 % 5\' 8"  (1.727 m) 73.5 kg (162 lb)       Recent laboratory studies:  Recent Labs  07/01/17 0901 07/02/17 0503  WBC 6.9 13.2*  HGB 12.9* 10.8*  HCT 39.1 32.7*  PLT 405* 350    Recent Labs  07/02/17 0503  NA 134*  K 4.1  CL 103  CO2 22  BUN 13  CREATININE 0.83  GLUCOSE 138*  CALCIUM 8.9   No results found for: INR, PROTIME   Recent Radiographic Studies :  No results found.  DISCHARGE INSTRUCTIONS: Discharge Instructions    Call MD / Call 911    Complete by:  As directed    If you experience chest pain or shortness of breath, CALL 911 and be transported to the hospital emergency room.  If you develope a fever above 101 F, pus (white drainage) or increased drainage or redness at the wound, or calf pain, call your surgeon's office.   Constipation Prevention    Complete by:  As directed    Drink plenty of fluids.  Prune juice may be helpful.  You may use a stool softener, such as Colace (over the counter) 100 mg twice a day.  Use MiraLax (over the counter) for constipation as needed.   Diet - low sodium heart healthy    Complete by:  As directed    Discharge instructions    Complete by:  As directed    INSTRUCTIONS AFTER JOINT REPLACEMENT   Remove items at home which could result  in a fall. This includes throw rugs or furniture in walking pathways ICE to the affected joint every three hours while awake for 30 minutes at a time, for at least the first 3-5 days, and then as needed for pain and swelling.  Continue to use ice for pain and swelling. You may notice swelling that will progress down to the foot and ankle.  This is normal after surgery.  Elevate your  leg when you are not up walking on it.   Continue to use the breathing machine you got in the hospital (incentive spirometer) which will help keep your temperature down.  It is common for your temperature to cycle up and down following surgery, especially at night when you are not up moving around and exerting yourself.  The breathing machine keeps your lungs expanded and your temperature down.   DIET:  As you were doing prior to hospitalization, we recommend a well-balanced diet.  DRESSING / WOUND CARE / SHOWERING  Keep the surgical dressing until follow up.  The dressing is water proof, so you can shower without any extra covering.  IF THE DRESSING FALLS OFF or the wound gets wet inside, change the dressing with sterile gauze.  Please use good hand washing techniques before changing the dressing.  Do not use any lotions or creams on the incision until instructed by your surgeon.    ACTIVITY  Increase activity slowly as tolerated, but follow the weight bearing instructions below.   No driving for 6 weeks or until further direction given by your physician.  You cannot drive while taking narcotics.  No lifting or carrying greater than 10 lbs. until further directed by your surgeon. Avoid periods of inactivity such as sitting longer than an hour when not asleep. This helps prevent blood clots.  You may return to work once you are authorized by your doctor.     WEIGHT BEARING   Weight bearing as tolerated with assist device (walker, cane, etc) as directed, use it as long as suggested by your surgeon or therapist, typically at least 4-6 weeks.   EXERCISES  Results after joint replacement surgery are often greatly improved when you follow the exercise, range of motion and muscle strengthening exercises prescribed by your doctor. Safety measures are also important to protect the joint from further injury. Any time any of these exercises cause you to have increased pain or swelling, decrease what  you are doing until you are comfortable again and then slowly increase them. If you have problems or questions, call your caregiver or physical therapist for advice.   Rehabilitation is important following a joint replacement. After just a few days of immobilization, the muscles of the leg can become weakened and shrink (atrophy).  These exercises are designed to build up the tone and strength of the thigh and leg muscles and to improve motion. Often times heat used for twenty to thirty minutes before working out will loosen up your tissues and help with improving the range of motion but do not use heat for the first two weeks following surgery (sometimes heat can increase post-operative swelling).   These exercises can be done on a training (exercise) mat, on the floor, on a table or on a bed. Use whatever works the best and is most comfortable for you.    Use music or television while you are exercising so that the exercises are a pleasant break in your day. This will make your life better with the exercises acting as  a break in your routine that you can look forward to.   Perform all exercises about fifteen times, three times per day or as directed.  You should exercise both the operative leg and the other leg as well.   Exercises include:   Quad Sets - Tighten up the muscle on the front of the thigh (Quad) and hold for 5-10 seconds.   Straight Leg Raises - With your knee straight (if you were given a brace, keep it on), lift the leg to 60 degrees, hold for 3 seconds, and slowly lower the leg.  Perform this exercise against resistance later as your leg gets stronger.  Leg Slides: Lying on your back, slowly slide your foot toward your buttocks, bending your knee up off the floor (only go as far as is comfortable). Then slowly slide your foot back down until your leg is flat on the floor again.  Angel Wings: Lying on your back spread your legs to the side as far apart as you can without causing  discomfort.  Hamstring Strength:  Lying on your back, push your heel against the floor with your leg straight by tightening up the muscles of your buttocks.  Repeat, but this time bend your knee to a comfortable angle, and push your heel against the floor.  You may put a pillow under the heel to make it more comfortable if necessary.   A rehabilitation program following joint replacement surgery can speed recovery and prevent re-injury in the future due to weakened muscles. Contact your doctor or a physical therapist for more information on knee rehabilitation.    CONSTIPATION  Constipation is defined medically as fewer than three stools per week and severe constipation as less than one stool per week.  Even if you have a regular bowel pattern at home, your normal regimen is likely to be disrupted due to multiple reasons following surgery.  Combination of anesthesia, postoperative narcotics, change in appetite and fluid intake all can affect your bowels.   YOU MUST use at least one of the following options; they are listed in order of increasing strength to get the job done.  They are all available over the counter, and you may need to use some, POSSIBLY even all of these options:    Drink plenty of fluids (prune juice may be helpful) and high fiber foods Colace 100 mg by mouth twice a day  Senokot for constipation as directed and as needed Dulcolax (bisacodyl), take with full glass of water  Miralax (polyethylene glycol) once or twice a day as needed.  If you have tried all these things and are unable to have a bowel movement in the first 3-4 days after surgery call either your surgeon or your primary doctor.    If you experience loose stools or diarrhea, hold the medications until you stool forms back up.  If your symptoms do not get better within 1 week or if they get worse, check with your doctor.  If you experience "the worst abdominal pain ever" or develop nausea or vomiting, please contact  the office immediately for further recommendations for treatment.   ITCHING:  If you experience itching with your medications, try taking only a single pain pill, or even half a pain pill at a time.  You can also use Benadryl over the counter for itching or also to help with sleep.   TED HOSE STOCKINGS:  Use stockings on both legs until for at least 2 weeks or as directed by  physician office. They may be removed at night for sleeping.  MEDICATIONS:  See your medication summary on the "After Visit Summary" that nursing will review with you.  You may have some home medications which will be placed on hold until you complete the course of blood thinner medication.  It is important for you to complete the blood thinner medication as prescribed.  PRECAUTIONS:  If you experience chest pain or shortness of breath - call 911 immediately for transfer to the hospital emergency department.   If you develop a fever greater that 101 F, purulent drainage from wound, increased redness or drainage from wound, foul odor from the wound/dressing, or calf pain - CONTACT YOUR SURGEON.                                                   FOLLOW-UP APPOINTMENTS:  If you do not already have a post-op appointment, please call the office for an appointment to be seen by your surgeon.  Guidelines for how soon to be seen are listed in your "After Visit Summary", but are typically between 1-4 weeks after surgery.  OTHER INSTRUCTIONS:   Knee Replacement:  Do not place pillow under knee, focus on keeping the knee straight while resting. CPM instructions: 0-90 degrees, 2 hours in the morning, 2 hours in the afternoon, and 2 hours in the evening. Place foam block, curve side up under heel at all times except when in CPM or when walking.  DO NOT modify, tear, cut, or change the foam block in any way.  MAKE SURE YOU:  Understand these instructions.  Get help right away if you are not doing well or get worse.    Thank you for  letting us be a part of your medical care team.  It is a privilege we respect greatly.  We hope these instructions will help you stay on track for a fast and full recovery!   Increase activity slowly as tolerated    Complete by:  As directed       DISCHARGE MEDICATIONS:   Allergies as of 07/02/2017      Reactions   Morphine And Related Nausea And Vomiting   Propofol Nausea And Vomiting      Medication List    STOP taking these medications   meloxicam 15 MG tablet Commonly known as:  MOBIC     TAKE these medications   alfuzosin 10 MG 24 hr tablet Commonly known as:  UROXATRAL Take 10 mg by mouth daily.   ALPRAZolam 1 MG tablet Commonly known as:  XANAX Take 1 mg by mouth at bedtime.   aspirin 325 MG EC tablet Take 1 tablet (325 mg total) by mouth 2 (two) times daily.   docusate sodium 100 MG capsule Commonly known as:  COLACE Take 200 mg by mouth 2 (two) times daily.   esomeprazole 40 MG capsule Commonly known as:  NEXIUM Take 40 mg by mouth daily.   FISH OIL ULTRA 1400 MG Caps Take 1,400 mg by mouth daily.   gabapentin 400 MG capsule Commonly known as:  NEURONTIN Take 400 mg by mouth 2 (two) times daily.   Glucosamine HCl 1500 MG Tabs Take 1,500 mg by mouth daily.   HYDROcodone-acetaminophen 10-325 MG tablet Commonly known as:  NORCO Take 1 tablet by mouth 3 (three) times daily as needed  for moderate pain.   methocarbamol 500 MG tablet Commonly known as:  ROBAXIN Take 1-2 tablets (500-1,000 mg total) by mouth every 6 (six) hours as needed for muscle spasms.   Oxycodone HCl 10 MG Tabs Take 0.5-1 tablets (5-10 mg total) by mouth every 3 (three) hours as needed for breakthrough pain.   SALONPAS PAIN RELIEF PATCH EX Apply 2 patches topically daily as needed (back pain).   SUPER B COMPLEX PO Take 1 tablet by mouth daily.   tamsulosin 0.4 MG Caps capsule Commonly known as:  FLOMAX Take 0.4 mg by mouth daily.   Vitamin D3 2000 units capsule Take 2,000  Units by mouth daily.            Durable Medical Equipment        Start     Ordered   07/01/17 1542  DME Walker rolling  Once    Question:  Patient needs a walker to treat with the following condition  Answer:  S/P total knee replacement   07/01/17 1541   07/01/17 1542  DME 3 n 1  Once     07/01/17 1541   07/01/17 1542  DME Bedside commode  Once    Question:  Patient needs a bedside commode to treat with the following condition  Answer:  S/P total knee replacement   07/01/17 1541      FOLLOW UP VISIT:    DISPOSITION: HOME VS. SNF  CONDITION:  Good   Donia Ast 07/02/2017, 7:49 AM

## 2017-07-02 NOTE — Care Management (Signed)
Patient is already active with [facility] from previous surgery 06/18/17, they will resume at discharge. Patient has all necessary DME as well. Has family support. No further Case Management needs identified.

## 2017-07-02 NOTE — Progress Notes (Signed)
Pt for discharge to home. Wife present and will transport.All personal belongings with pt. Discharge instructions reviewed with pt. No distress noted. Pt will have home health PT then transition to out pt therapy

## 2017-07-02 NOTE — Op Note (Signed)
Christopher [name], Christopher [name]                 ACCOUNT NO.:  [id number]  MEDICAL RECORD NO.:  [id number]  LOCATION:  5N24C                        FACILITY:  [facility]  PHYSICIAN:  Estill Bamberg. Ronnie Derby, M.D. DATE OF BIRTH:  07-18-1946  DATE OF PROCEDURE:  07/01/2017 DATE OF DISCHARGE:                              OPERATIVE REPORT   SURGEON:  Estill Bamberg. Ronnie Derby, M.D.  ASSISTANT:  Nehemiah Massed, [facility].  ANESTHESIA:  General.  PREOPERATIVE DIAGNOSIS:  Left quadriceps tear.  POSTOPERATIVE DIAGNOSIS:  Left quadriceps tear.  PROCEDURE:  Left open quadriceps repair.  INDICATION FOR PROCEDURE:  The patient is a 71 year old, 2 weeks out from a total knee replacement that I did.  He fell just day prior to discharge and went home in a knee immobilizer.  After examining him a week ago, I felt that he had disrupted his repair of the capsule and quad tendon.  There appeared to be a tear of the following week and scheduled for surgery.  Informed consent was obtained.  DESCRIPTION OF PROCEDURE:  The patient was laid supine and administered general anesthesia.  The left leg was prepped and draped in the usual fashion.  The extremity was exsanguinated with the Esmarch and tourniquet inflated to 300 mmHg.  A midline incision was made with a #10 blade through the old incision.  The entire retinaculum disrupted.  I removed all my sutures as well as the hematoma and washed with the pulse lavage system.  I then closed the apex of the peripatellar arthrotomy as well as the VMO attachment to the patella with #2 FiberWire.  I then placed figure-of-eight #1 Vicryl sutures along the rest of the retinaculum.  I ranged the knee aggressively.  The patella tracked well with watertight closure.  I then irrigated again.  I did infiltrate with a 30-mL saline, 0.25% Marcaine, Exparel mixture.  I also did use some topical tranexamic acid during closure since he was on 2 weeks of aspirin.  I closed with 0 buried Vicryl, Monocryl, and  Steri-Strips and dressed with an Adaptic dressing.  The patient tolerated the procedure well.  TOURNIQUET TIME:  22 minutes.  COMPLICATIONS:  None.  DRAINS:  None.  ESTIMATED BLOOD LOSS:  Minimal.          ______________________________ Estill Bamberg. Ronnie Derby, M.D.     SDL/MEDQ  D:  07/01/2017  T:  07/02/2017  Job:  [id number]

## 2017-07-08 ENCOUNTER — Ambulatory Visit (HOSPITAL_COMMUNITY): Payer: Medicare Other | Attending: Orthopedic Surgery | Admitting: Physical Therapy

## 2017-07-08 ENCOUNTER — Encounter (HOSPITAL_COMMUNITY): Payer: Self-pay | Admitting: Physical Therapy

## 2017-07-08 DIAGNOSIS — M25562 Pain in left knee: Secondary | ICD-10-CM | POA: Diagnosis present

## 2017-07-08 DIAGNOSIS — R2689 Other abnormalities of gait and mobility: Secondary | ICD-10-CM | POA: Insufficient documentation

## 2017-07-08 DIAGNOSIS — M25662 Stiffness of left knee, not elsewhere classified: Secondary | ICD-10-CM | POA: Insufficient documentation

## 2017-07-08 NOTE — Therapy (Signed)
[place], [place], [address] Phone: [phone]   Fax:  [phone]  Physical Therapy Evaluation  Patient Details  Name: Christopher [name] MRN: [id number] Date of Birth: Oct 21, 1946 Referring Provider: Augustin Coupe, MD   Encounter Date: 07/08/2017      PT End of Session - 07/08/17 1230    Visit Number 1   Number of Visits 13   Date for PT Re-Evaluation 08/05/17   Authorization Type [employer] Medicare   Authorization Time Period 07/08/17 to 08/05/17   PT Start Time 0951   PT Stop Time 1030   PT Time Calculation (min) 39 min   Activity Tolerance Patient tolerated treatment well;No increased pain   Behavior During Therapy WFL for tasks assessed/performed      Past Medical History:  Diagnosis Date  . Arthritis    "all over my body" (06/17/2017)  . BPH (benign prostatic hyperplasia)    takes Proscar daily  . Chronic back pain    DDD; "upper and lower" (06/17/2017)  . Constipation    takes Colace daily  . DDD (degenerative disc disease), cervical   . Enlarged prostate   . GERD (gastroesophageal reflux disease)    takes Nexium daily  . H/O hiatal hernia   . History of colon polyps    benign  . History of gastric ulcer   . Insomnia    takes Xanax nightly  . Joint capsule tear    left knee  . Joint pain   . Joint swelling   . Macular degeneration   . Muscle spasm of back    takes Valium daily as needed  . Peripheral neuropathy    takes Gabapentin daily  . PONV (postoperative nausea and vomiting)   . Urinary hesitancy   . Weakness    numbness and tingling    Past Surgical History:  Procedure Laterality Date  . ANTERIOR CERVICAL DECOMP/DISCECTOMY FUSION  ~ 1991   "took bone from hip"  . ANTERIOR CERVICAL DECOMP/DISCECTOMY FUSION  X 2   "put plate in"  . ANTERIOR CERVICAL DISCECTOMY  <11/2005    5-6 and 4-5./notes 05/02/2011  . APPENDECTOMY    . BACK SURGERY    . CARDIAC CATHETERIZATION    . CARPAL TUNNEL  RELEASE Bilateral 10/2005 - 11/2005   right-left [facility]o 05/02/2011  . CATARACT EXTRACTION W/PHACO Right 05/24/2014   Procedure: CATARACT EXTRACTION PHACO AND INTRAOCULAR LENS PLACEMENT (IOC);  Surgeon: [name], MD;  Location: [facility];  Service: Ophthalmology;  Laterality: Right;  CDE 6.74  . CATARACT EXTRACTION W/PHACO Left 06/17/2014   Procedure: CATARACT EXTRACTION PHACO AND INTRAOCULAR LENS PLACEMENT (IOC);  Surgeon: [name], MD;  Location: [facility];  Service: Ophthalmology;  Laterality: Left;  CDE:4.65  . COLONOSCOPY WITH ESOPHAGOGASTRODUODENOSCOPY (EGD) N/A 01/22/2013   Procedure: COLONOSCOPY WITH ESOPHAGOGASTRODUODENOSCOPY (EGD);  Surgeon: [name], MD;  [facility];  Service: Endoscopy;  Laterality: N/A;  140  . EYE SURGERY Bilateral    "cleaned film w/laser; since my 1st cataract OR"  . JOINT REPLACEMENT    . POSTERIOR LUMBAR FUSION     "put a plate in"  . QUADRICEPS TENDON REPAIR Left 07/01/2017  . QUADRICEPS TENDON REPAIR Left 07/01/2017   Procedure: REPAIR QUADRICEP TENDON;  Surgeon: [name], MD;  Location: [facility];  Service: Orthopedics;  Laterality: Left;  . SHOULDER ARTHROSCOPY Bilateral   . SPINAL CORD STIMULATOR INSERTION N/A 09/23/2015   Procedure: LUMBAR SPINAL CORD STIMULATOR INSERTION;  Surgeon: [name], MD;  Location: [facility];  Service: Neurosurgery;  Laterality: N/A;  LUMBAR SPINAL CORD STIMULATOR INSERTION  . TOTAL KNEE ARTHROPLASTY Left 06/17/2017  . TOTAL KNEE ARTHROPLASTY Left 06/17/2017   Procedure: TOTAL KNEE ARTHROPLASTY;  Surgeon: [name], MD;  Location: [facility];  Service: Orthopedics;  Laterality: Left;    There were no vitals filed for this visit.       Subjective Assessment - 07/08/17 0955    Subjective Pt reports having a Lt TKA on 06/17/17. He was getting ready for discharge while at the hospital and had a close fall which resulted in his stitches coming apart. He was discharged home, and at his follow-up they took an X-ray.  This was negative, so they had to go in to repair the incision on 07/01/17. He is trying to do some of his exercises, but notices that some cause his knee to be extra sore. Otherwise, things are going well. He continues to use his CPM.   Pertinent History arthritis, DDD, ACDF 1900s, quad tendon repair 07/01/17   Limitations Walking;Standing;House hold activities   How long can you sit comfortably? unlimited if moving his knee    How long can you stand comfortably? unsure    How long can you walk comfortably? unsure    Patient Stated Goals improve knee pain    Currently in Pain? Yes   Pain Score 3    Pain Location Knee   Pain Orientation Left   Pain Descriptors / Indicators Aching   Pain Type Surgical pain   Pain Radiating Towards none    Pain Onset 1 to 4 weeks ago   Pain Frequency Constant   Aggravating Factors  bending the knee    Pain Relieving Factors moving his knee around    Effect of Pain on Daily Activities moderate limtiation    Multiple Pain Sites No            [facility] PT Assessment - 07/08/17 0001      Assessment   Medical Diagnosis Lt TKA   Referring Provider Augustin Coupe, MD    Onset Date/Surgical Date --  06/18/17 Lt TKA; 07/01/17 Lt repair    Next MD Visit Unsure   Possibly this Friday    Prior Therapy HHPT x4-6 visits total      Precautions   Precautions None     Restrictions   Weight Bearing Restrictions No     Balance Screen   Has the patient fallen in the past 6 months No  1 close fall in the hospital; other close falls due to RLE   Has the patient had a decrease in activity level because of a fear of falling?  Yes   Is the patient reluctant to leave their home because of a fear of falling?  No     Home Social worker Private residence   Living Arrangements Spouse/significant other   Additional Comments ramp to enter home      Prior Function   Level of Independence Independent     Cognition   Overall Cognitive Status Within  Functional Limits for tasks assessed     ROM / Strength   AROM / PROM / Strength AROM;Strength     AROM   AROM Assessment Site Knee   Right/Left Knee Left   Left Knee Extension 4  lacking    Left Knee Flexion 85  95 flexion     Strength   Strength Assessment Site Hip;Knee;Ankle  Right/Left Knee Left   Left Knee Flexion 3+/5   Left Knee Extension 2+/5   Right/Left Ankle Left   Left Ankle Dorsiflexion 5/5     Transfers   Five time sit to stand comments  15.2 sec, UE support and weight shift Rt      Ambulation/Gait   Pre-Gait Activities Ascend/descend 4x 6" steps with RW and step to pattern    Gait Comments step to pattern, Lt knee flexed, weight through toe of Lt foot, increased support through RW     Standardized Balance Assessment   Standardized Balance Assessment Timed Up and Go Test     Timed Up and Go Test   TUG Comments 27.8, with RW            Objective measurements completed on examination: See above findings.          [facility] Adult PT Treatment/Exercise - 07/08/17 0001      Exercises   Exercises Knee/Hip     Knee/Hip Exercises: Seated   Long Arc Quad Left;1 set;5 reps   Long Arc Quad Limitations through partial range    Heel Slides Left;1 set;10 reps   Heel Slides Limitations with 5 sec hold      Knee/Hip Exercises: Supine   Quad Sets Left;1 set;5 reps   Heel Slides Left;1 set;5 reps   Heel Slides Limitations supine                 PT Education - 07/08/17 1229    Education provided Yes   Education Details importance of adhering to his HEP; reviewed and updated HEP based on therex provided by HHPT; eval findings/POC   Person(s) Educated Patient   Methods Explanation;Demonstration;Verbal cues;Handout   Comprehension Verbalized understanding;Returned demonstration          PT Short Term Goals - 07/08/17 1241      PT SHORT TERM GOAL #1   Title Pt will demo consistency and independence with his HEP to improve ROM, strength and  mobility.    Time 2   Period Weeks   Status New   Target Date 07/22/17     PT SHORT TERM GOAL #2   Title Pt will demo improved awareness of sit to stand mechanics, evident by his ability to complete sit to stand with equal weight shift, x5 trials.    Time 2   Period Weeks   Status New           PT Long Term Goals - 07/08/17 1245      PT LONG TERM GOAL #1   Title Pt will demo improved LE strength to atleast 4/5 MMT, which will increase his safety with daily activity.   Time 4   Period Weeks   Status New   Target Date 08/05/17     PT LONG TERM GOAL #2   Title pt will demo improved functional strength and power evident by his ability to complete 5x sit to stand in less than 13 sec, without UE support and minimal weight shift.    Time 4   Period Weeks   Status New     PT LONG TERM GOAL #3   Title pt will demo improved functional balance evident by his ability to complete the TUG in less than 14 sec, with LRAD.   Time 4   Period Weeks   Status New     PT LONG TERM GOAL #4   Title pt will report atleast 50% improvement in his mobility,  pain and functional performance, since the start of PT, which will increase his activity tolerance and independence.   Time 4   Period Weeks   Status New                Plan - 07-21-17 1233    Clinical Impression Statement Pt is a 70 y.o M referred to OPPT s/p Lt TKA on 06/18/17 as well as a quadriceps tendon repair on 07/01/17 as a result of nearly falling and breaking some of the stitches along his incision. He arrives today with post-op soreness and stiffness, limiting his performance on functional testing. He is lacking ~5 deg of terminal knee extension as well as knee flexion limitation. His strength is limited in the Lt quadriceps and hamstring as well, evident by his inability to complete a LAQ without assistance. He would benefit from skilled PT to address his limitations and facilitate return to daily activity without assistance.     History and Personal Factors relevant to plan of care: history of Rt knee issues; 2 surgeries on Lt knee following a fall post TKA   Clinical Presentation Stable   Clinical Presentation due to: ROM, strength and functional testing    Clinical Decision Making Low   Rehab Potential Good   Clinical Impairments Affecting Rehab Potential (+) pt reporting he was highly active prior to surgery   PT Frequency 3x / week   PT Duration 4 weeks   PT Treatment/Interventions ADLs/Self Care Home Management;Electrical Stimulation;Cryotherapy;Balance training;Therapeutic exercise;Therapeutic activities;Functional mobility training;Stair training;Gait training;Neuromuscular re-education;Patient/family education;Scar mobilization;Taping;Manual techniques;Passive range of motion;Dry needling   PT Next Visit Plan Focus on knee extension/flexion ROM (active and passive); sit to stand technique without weight shift Lt    PT Home Exercise Plan from pt's HHPT booklet: seated knee flexion stretch, supine quad sets, supine heel slides    Consulted and Agree with Plan of Care Patient      Patient will benefit from skilled therapeutic intervention in order to improve the following deficits and impairments:  Abnormal gait, Decreased balance, Decreased activity tolerance, Increased fascial restricitons, Impaired sensation, Improper body mechanics, Pain, Postural dysfunction, Increased muscle spasms, Decreased mobility, Decreased scar mobility, Decreased endurance, Decreased range of motion, Decreased strength, Hypomobility, Impaired flexibility, Difficulty walking  Visit Diagnosis: Stiffness of left knee, not elsewhere classified  Acute pain of left knee  Other abnormalities of gait and mobility      G-Codes - 2017/07/21 1244    Functional Assessment Tool Used (Outpatient Only) Clinical judgement based on assessment of ROM, strength, mobility    Functional Limitation Mobility: Walking and moving around   Mobility:  Walking and Moving Around Current Status ([id number]) At least 40 percent but less than 60 percent impaired, limited or restricted   Mobility: Walking and Moving Around Goal Status [phone]) At least 20 percent but less than 40 percent impaired, limited or restricted       Problem List Patient Active Problem List   Diagnosis Date Noted  . 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 Spondylolisthesis of lumbar region 03/05/2014  . Barrett esophagus 02/24/2013  . Personal history of colonic polyps 02/24/2013  . Epigastric pain 01/06/2013  . Encounter for screening colonoscopy 01/06/2013  . Pain in joint, shoulder region 07/09/2012  . Muscle weakness (generalized) 07/09/2012     12:54 PM,July 21, 2017 Elly Modena PT, DPT [facility] [address] [place], [place], [address] Phone: [phone]   Fax:  [phone]  Name: Christopher [name] MRN: [id number] Date of Birth: [birth year]

## 2017-07-09 ENCOUNTER — Ambulatory Visit (HOSPITAL_COMMUNITY): Payer: Medicare Other | Admitting: Physical Therapy

## 2017-07-09 DIAGNOSIS — M25662 Stiffness of left knee, not elsewhere classified: Secondary | ICD-10-CM | POA: Diagnosis not present

## 2017-07-09 DIAGNOSIS — M25562 Pain in left knee: Secondary | ICD-10-CM

## 2017-07-09 DIAGNOSIS — R2689 Other abnormalities of gait and mobility: Secondary | ICD-10-CM

## 2017-07-09 NOTE — Therapy (Signed)
[place], [place], [address] Phone: [phone]   Fax:  [phone]  Physical Therapy Treatment  Patient Details  Name: Christopher [name] MRN: [id number] Date of Birth: [birth year] Referring Provider: Augustin Coupe, MD   Encounter Date: 07/09/2017      PT End of Session - 07/09/17 1207    Visit Number 2   Number of Visits 13   Date for PT Re-Evaluation 08/05/17   Authorization Type [employer] Medicare   Authorization Time Period 07/08/17 to 08/05/17   PT Start Time 1033   PT Stop Time 1112   PT Time Calculation (min) 39 min   Activity Tolerance Patient tolerated treatment well;Patient limited by pain   Behavior During Therapy [facility] for tasks assessed/performed      Past Medical History:  Diagnosis Date  . Arthritis    "all over my body" (06/17/2017)  . BPH (benign prostatic hyperplasia)    takes Proscar daily  . Chronic back pain    DDD; "upper and lower" (06/17/2017)  . Constipation    takes Colace daily  . DDD (degenerative disc disease), cervical   . Enlarged prostate   . GERD (gastroesophageal reflux disease)    takes Nexium daily  . H/O hiatal hernia   . History of colon polyps    benign  . History of gastric ulcer   . Insomnia    takes Xanax nightly  . Joint capsule tear    left knee  . Joint pain   . Joint swelling   . Macular degeneration   . Muscle spasm of back    takes Valium daily as needed  . Peripheral neuropathy    takes Gabapentin daily  . PONV (postoperative nausea and vomiting)   . Urinary hesitancy   . Weakness    numbness and tingling    Past Surgical History:  Procedure Laterality Date  . ANTERIOR CERVICAL DECOMP/DISCECTOMY FUSION  ~ 1991   "took bone from hip"  . ANTERIOR CERVICAL DECOMP/DISCECTOMY FUSION  X 2   "put plate in"  . ANTERIOR CERVICAL DISCECTOMY  <11/2005    5-6 and 4-5./notes 05/02/2011  . APPENDECTOMY    . BACK SURGERY    . CARDIAC CATHETERIZATION    . CARPAL  TUNNEL RELEASE Bilateral 10/2005 - 11/2005   right-left [facility] Endo 05/02/2011  . CATARACT EXTRACTION W/PHACO Right 05/24/2014   Procedure: CATARACT EXTRACTION PHACO AND INTRAOCULAR LENS PLACEMENT (IOC);  Surgeon: [name], MD;  Location: [facility];  Service: Ophthalmology;  Laterality: Right;  CDE 6.74  . CATARACT EXTRACTION W/PHACO Left 06/17/2014   Procedure: CATARACT EXTRACTION PHACO AND INTRAOCULAR LENS PLACEMENT (IOC);  Surgeon: [name], MD;  Location: [facility];  Service: Ophthalmology;  Laterality: Left;  CDE:4.65  . COLONOSCOPY WITH ESOPHAGOGASTRODUODENOSCOPY (EGD) N/A 01/22/2013   Procedure: COLONOSCOPY WITH ESOPHAGOGASTRODUODENOSCOPY (EGD);  Surgeon: [name], MD;  [facility];  Service: Endoscopy;  Laterality: N/A;  140  . EYE SURGERY Bilateral    "cleaned film w/laser; since my 1st cataract OR"  . JOINT REPLACEMENT    . POSTERIOR LUMBAR FUSION     "put a plate in"  . QUADRICEPS TENDON REPAIR Left 07/01/2017  . QUADRICEPS TENDON REPAIR Left 07/01/2017   Procedure: REPAIR QUADRICEP TENDON;  Surgeon: [name], MD;  Location: [facility];  Service: Orthopedics;  Laterality: Left;  . SHOULDER ARTHROSCOPY Bilateral   . SPINAL CORD STIMULATOR INSERTION N/A 09/23/2015   Procedure: LUMBAR SPINAL CORD STIMULATOR INSERTION;  Surgeon: [name], MD;  Location: [facility];  Service: Neurosurgery;  Laterality: N/A;  LUMBAR SPINAL CORD STIMULATOR INSERTION  . TOTAL KNEE ARTHROPLASTY Left 06/17/2017  . TOTAL KNEE ARTHROPLASTY Left 06/17/2017   Procedure: TOTAL KNEE ARTHROPLASTY;  Surgeon: [name], MD;  Location: [facility];  Service: Orthopedics;  Laterality: Left;    There were no vitals filed for this visit.      Subjective Assessment - 07/09/17 1035    Subjective Patient arrives today stating he is not doing that well, his knee is hurting. Every time he does a lot of exercise it gets to wehre he can't do anything with it, he is also scared his other knee with give out on him.     Pertinent History arthritis, DDD, ACDF 1900s, quad tendon repair 07/01/17   Patient Stated Goals improve knee pain    Currently in Pain? Yes   Pain Score 5    Pain Location Knee   Pain Orientation Left                         [facility] Adult PT Treatment/Exercise - 07/09/17 0001      Knee/Hip Exercises: Supine   Quad Sets Left;1 set;15 reps   Quad Sets Limitations 5 second holds    Short Arc [employer] Left;1 set;10 reps   Short Arc [employer] Limitations mod-max assist    Heel Slides Left;15 reps   Heel Slides Limitations 5 second holds    Straight Leg Raises Left;1 set;10 reps   Straight Leg Raises Limitations mod-max assist for partial lift      Knee/Hip Exercises: Sidelying   Other Sidelying Knee/Hip Exercises sidelying LAQ and HS curls 1x10 L LE      Manual Therapy   Manual Therapy Edema management   Manual therapy comments separate from other skllled services    Edema Management retrograde massage LE elevated                 PT Education - 07/09/17 1118    Education provided Yes   Education Details importance of activity/hEP and what to expect after TKR especialy with mulitple surgeries involved; review of initial eval/goals    Person(s) Educated Patient   Methods Explanation;Handout   Comprehension Verbalized understanding;Need further instruction          PT Short Term Goals - 07/08/17 1241      PT SHORT TERM GOAL #1   Title Pt will demo consistency and independence with his HEP to improve ROM, strength and mobility.    Time 2   Period Weeks   Status New   Target Date 07/22/17     PT SHORT TERM GOAL #2   Title Pt will demo improved awareness of sit to stand mechanics, evident by his ability to complete sit to stand with equal weight shift, x5 trials.    Time 2   Period Weeks   Status New           PT Long Term Goals - 07/08/17 1245      PT LONG TERM GOAL #1   Title Pt will demo improved LE strength to atleast 4/5 MMT, which  will increase his safety with daily activity.   Time 4   Period Weeks   Status New   Target Date 08/05/17     PT LONG TERM GOAL #2   Title pt will demo improved functional strength and power evident by his ability  to complete 5x sit to stand in less than 13 sec, without UE support and minimal weight shift.    Time 4   Period Weeks   Status New     PT LONG TERM GOAL #3   Title pt will demo improved functional balance evident by his ability to complete the TUG in less than 14 sec, with LRAD.   Time 4   Period Weeks   Status New     PT LONG TERM GOAL #4   Title pt will report atleast 50% improvement in his mobility, pain and functional performance, since the start of PT, which will increase his activity tolerance and independence.   Time 4   Period Weeks   Status New               Plan - 07/09/17 1207    Clinical Impression Statement Patient arrives today reporting ongoing pain and difficulty with his knee, and repeatedly stating "I can't do this" and " the exercises make it hurt worse" throughout session, requiring encouragement from PT throughout session today. Performed retrograde massage for edema control followed by general exercises for ROM and quad strength, noting minimal effort from patient with assisted SAQS and partial SLRs in supine however patient able to perform sidelying LAQs and HS curls with just verbal cues from PT. Reviewed initial eval/goals at [facility].    Rehab Potential Good   Clinical Impairments Affecting Rehab Potential (+) pt reporting he was highly active prior to surgery   PT Frequency 3x / week   PT Duration 4 weeks   PT Treatment/Interventions ADLs/Self Care Home Management;Electrical Stimulation;Cryotherapy;Balance training;Therapeutic exercise;Therapeutic activities;Functional mobility training;Stair training;Gait training;Neuromuscular re-education;Patient/family education;Scar mobilization;Taping;Manual techniques;Passive range of motion;Dry needling    PT Next Visit Plan continue knee ROM (AROM and PROM), sidelying/gravity eliminated strengthening. Sit to stand without weight shift L    PT Home Exercise Plan from pt's HHPT booklet: seated knee flexion stretch, supine quad sets, supine heel slides    Consulted and Agree with Plan of Care Patient      Patient will benefit from skilled therapeutic intervention in order to improve the following deficits and impairments:  Abnormal gait, Decreased balance, Decreased activity tolerance, Increased fascial restricitons, Impaired sensation, Improper body mechanics, Pain, Postural dysfunction, Increased muscle spasms, Decreased mobility, Decreased scar mobility, Decreased endurance, Decreased range of motion, Decreased strength, Hypomobility, Impaired flexibility, Difficulty walking  Visit Diagnosis: Stiffness of left knee, not elsewhere classified  Acute pain of left knee  Other abnormalities of gait and mobility    Problem List Patient Active Problem List   Diagnosis Date Noted  . 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 Spondylolisthesis of lumbar region 03/05/2014  . Barrett esophagus 02/24/2013  . Personal history of colonic polyps 02/24/2013  . Epigastric pain 01/06/2013  . Encounter for screening colonoscopy 01/06/2013  . Pain in joint, shoulder region 07/09/2012  . Muscle weakness (generalized) 07/09/2012    Deniece Ree PT, DPT [facility] [address] [place], [place], [address] Phone: [phone]   Fax:  [phone]  Name: Christopher [name] MRN: [id number] Date of Birth: 27-Jul-1946

## 2017-07-15 ENCOUNTER — Ambulatory Visit (HOSPITAL_COMMUNITY): Payer: Medicare Other | Admitting: Physical Therapy

## 2017-07-15 DIAGNOSIS — M25562 Pain in left knee: Secondary | ICD-10-CM

## 2017-07-15 DIAGNOSIS — M25662 Stiffness of left knee, not elsewhere classified: Secondary | ICD-10-CM | POA: Diagnosis not present

## 2017-07-15 DIAGNOSIS — R2689 Other abnormalities of gait and mobility: Secondary | ICD-10-CM

## 2017-07-15 NOTE — Therapy (Signed)
[place], [place], [address] Phone: [phone]   Fax:  [phone]  Physical Therapy Treatment  Patient Details  Name: Christopher [name] MRN: [id number] Date of Birth: [birth year] Referring Provider: Augustin Coupe, MD   Encounter Date: 07/15/2017      PT End of Session - 07/15/17 1705    Visit Number 3   Number of Visits 13   Date for PT Re-Evaluation 08/05/17   Authorization Type [employer] Medicare   Authorization Time Period 07/08/17 to 08/05/17   PT Start Time 1348   PT Stop Time 1430   PT Time Calculation (min) 42 min   Activity Tolerance Patient tolerated treatment well;Patient limited by pain   Behavior During Therapy [facility] for tasks assessed/performed      Past Medical History:  Diagnosis Date  . Arthritis    "all over my body" (06/17/2017)  . BPH (benign prostatic hyperplasia)    takes Proscar daily  . Chronic back pain    DDD; "upper and lower" (06/17/2017)  . Constipation    takes Colace daily  . DDD (degenerative disc disease), cervical   . Enlarged prostate   . GERD (gastroesophageal reflux disease)    takes Nexium daily  . H/O hiatal hernia   . History of colon polyps    benign  . History of gastric ulcer   . Insomnia    takes Xanax nightly  . Joint capsule tear    left knee  . Joint pain   . Joint swelling   . Macular degeneration   . Muscle spasm of back    takes Valium daily as needed  . Peripheral neuropathy    takes Gabapentin daily  . PONV (postoperative nausea and vomiting)   . Urinary hesitancy   . Weakness    numbness and tingling    Past Surgical History:  Procedure Laterality Date  . ANTERIOR CERVICAL DECOMP/DISCECTOMY FUSION  ~ 1991   "took bone from hip"  . ANTERIOR CERVICAL DECOMP/DISCECTOMY FUSION  X 2   "put plate in"  . ANTERIOR CERVICAL DISCECTOMY  <11/2005    5-6 and 4-5./notes 05/02/2011  . APPENDECTOMY    . BACK SURGERY    . CARDIAC CATHETERIZATION    . CARPAL  TUNNEL RELEASE Bilateral 10/2005 - 11/2005   right-left Archie Endo 05/02/2011  . CATARACT EXTRACTION W/PHACO Right 05/24/2014   Procedure: CATARACT EXTRACTION PHACO AND INTRAOCULAR LENS PLACEMENT (IOC);  Surgeon: [name], MD;  [facility]: [facility];  Service: Ophthalmology;  Laterality: Right;  CDE 6.74  . CATARACT EXTRACTION W/PHACO Left 06/17/2014   Procedure: CATARACT EXTRACTION PHACO AND INTRAOCULAR LENS PLACEMENT (IOC);  Surgeon: [name], MD;  Location: [facility];  Service: Ophthalmology;  Laterality: Left;  CDE:4.65  . COLONOSCOPY WITH ESOPHAGOGASTRODUODENOSCOPY (EGD) N/A 01/22/2013   Procedure: COLONOSCOPY WITH ESOPHAGOGASTRODUODENOSCOPY (EGD);  Surgeon: [name], MD;  [facility];  Service: Endoscopy;  Laterality: N/A;  140  . EYE SURGERY Bilateral    "cleaned film w/laser; since my 1st cataract OR"  . JOINT REPLACEMENT    . POSTERIOR LUMBAR FUSION     "put a plate in"  . QUADRICEPS TENDON REPAIR Left 07/01/2017  . QUADRICEPS TENDON REPAIR Left 07/01/2017   Procedure: REPAIR QUADRICEP TENDON;  Surgeon: [name], MD;  Location: [place];  Service: Orthopedics;  Laterality: Left;  . SHOULDER ARTHROSCOPY Bilateral   . SPINAL CORD STIMULATOR INSERTION N/A 09/23/2015   Procedure: LUMBAR SPINAL CORD STIMULATOR INSERTION;  Surgeon: [name], MD;  Location: [facility] NEURO ORS;  Service: Neurosurgery;  Laterality: N/A;  LUMBAR SPINAL CORD STIMULATOR INSERTION  . TOTAL KNEE ARTHROPLASTY Left 06/17/2017  . TOTAL KNEE ARTHROPLASTY Left 06/17/2017   Procedure: TOTAL KNEE ARTHROPLASTY;  Surgeon: [name], MD;  Location: [facility];  Service: Orthopedics;  Laterality: Left;    There were no vitals filed for this visit.      Subjective Assessment - 07/15/17 1714    Subjective Pt states he will have shooting pain that comes out from no where when he least expects it.  STates his pain remainsaround 5/10 around his patellar tendon insertion.   Currently in Pain? Yes   Pain Score 5    Pain  Location Knee   Pain Orientation Left   Pain Descriptors / Indicators Aching;Shooting;Sore                         [facility] Adult PT Treatment/Exercise - 07/15/17 0001      Knee/Hip Exercises: Seated   Long Arc Quad Left;1 set;5 reps   Long Arc Quad Limitations through partial range    Heel Slides Left;1 set;10 reps   Heel Slides Limitations with 5 sec hold      Knee/Hip Exercises: Supine   Quad Sets Left;1 set;15 reps   Short Arc Quad Sets Left;1 set;10 reps   Heel Slides Left;15 reps   Straight Leg Raises Left;1 set;10 reps   Straight Leg Raises Limitations mod-max assist for partial lift      Knee/Hip Exercises: Sidelying   Other Sidelying Knee/Hip Exercises sidelying LAQ and HS curls 1x10 L LE      Manual Therapy   Manual Therapy Edema management   Manual therapy comments separate from other skllled services    Edema Management retrograde massage LE elevated                   PT Short Term Goals - 07/08/17 1241      PT SHORT TERM GOAL #1   Title Pt will demo consistency and independence with his HEP to improve ROM, strength and mobility.    Time 2   Period Weeks   Status New   Target Date 07/22/17     PT SHORT TERM GOAL #2   Title Pt will demo improved awareness of sit to stand mechanics, evident by his ability to complete sit to stand with equal weight shift, x5 trials.    Time 2   Period Weeks   Status New           PT Long Term Goals - 07/08/17 1245      PT LONG TERM GOAL #1   Title Pt will demo improved LE strength to atleast 4/5 MMT, which will increase his safety with daily activity.   Time 4   Period Weeks   Status New   Target Date 08/05/17     PT LONG TERM GOAL #2   Title pt will demo improved functional strength and power evident by his ability to complete 5x sit to stand in less than 13 sec, without UE support and minimal weight shift.    Time 4   Period Weeks   Status New     PT LONG TERM GOAL #3   Title pt will  demo improved functional balance evident by his ability to complete the TUG in less than 14 sec, with LRAD.   Time 4   Period Weeks  Status New     PT LONG TERM GOAL #4   Title pt will report atleast 50% improvement in his mobility, pain and functional performance, since the start of PT, which will increase his activity tolerance and independence.   Time 4   Period Weeks   Status New               Plan - 07/15/17 1706    Clinical Impression Statement Pt with continued pain and dysfunction.  Reports complaince with HEP and with noted functional ROM.  Continued with strengthening with noted quad weakness and manual techniques to reduce swelling and restrictions.  Pt to return to MD tomorrow for bandage removal and post-0p check.    Rehab Potential Good   Clinical Impairments Affecting Rehab Potential (+) pt reporting he was highly active prior to surgery   PT Frequency 3x / week   PT Duration 4 weeks   PT Treatment/Interventions ADLs/Self Care Home Management;Electrical Stimulation;Cryotherapy;Balance training;Therapeutic exercise;Therapeutic activities;Functional mobility training;Stair training;Gait training;Neuromuscular re-education;Patient/family education;Scar mobilization;Taping;Manual techniques;Passive range of motion;Dry needling   PT Next Visit Plan continue knee ROM (AROM and PROM), sidelying/gravity eliminated strengthening. Progress to standing exercises when able.     PT Home Exercise Plan from pt's HHPT booklet: seated knee flexion stretch, supine quad sets, supine heel slides    Consulted and Agree with Plan of Care Patient      Patient will benefit from skilled therapeutic intervention in order to improve the following deficits and impairments:  Abnormal gait, Decreased balance, Decreased activity tolerance, Increased fascial restricitons, Impaired sensation, Improper body mechanics, Pain, Postural dysfunction, Increased muscle spasms, Decreased mobility, Decreased  scar mobility, Decreased endurance, Decreased range of motion, Decreased strength, Hypomobility, Impaired flexibility, Difficulty walking  Visit Diagnosis: Stiffness of left knee, not elsewhere classified  Acute pain of left knee  Other abnormalities of gait and mobility     Problem List Patient Active Problem List   Diagnosis Date Noted  . 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 Spondylolisthesis of lumbar region 03/05/2014  . Barrett esophagus 02/24/2013  . Personal history of colonic polyps 02/24/2013  . Epigastric pain 01/06/2013  . Encounter for screening colonoscopy 01/06/2013  . Pain in joint, shoulder region 07/09/2012  . Muscle weakness (generalized) 07/09/2012   Teena Irani, PTA/CLT [phone]  Teena Irani 07/15/2017, 5:17 PM  [facility] [address] [place], [place], [address] Phone: [phone]   Fax:  [phone]  Name: Christopher [name] MRN: [id number] Date of Birth: 1946/09/22

## 2017-07-17 ENCOUNTER — Ambulatory Visit (HOSPITAL_COMMUNITY): Payer: Medicare Other | Attending: Orthopedic Surgery

## 2017-07-17 ENCOUNTER — Telehealth (HOSPITAL_COMMUNITY): Payer: Self-pay

## 2017-07-17 NOTE — Telephone Encounter (Signed)
No show #1: Left voicemail regarding missed appointment. Reminded pt of next scheduled appointment and encouraged him to call the office if he cannot make it.   Geraldine Solar PT, DPT

## 2017-07-19 ENCOUNTER — Ambulatory Visit (HOSPITAL_COMMUNITY): Payer: Medicare Other | Admitting: Physical Therapy

## 2017-07-22 ENCOUNTER — Encounter (HOSPITAL_COMMUNITY): Payer: Self-pay | Admitting: *Deleted

## 2017-07-22 ENCOUNTER — Observation Stay (HOSPITAL_COMMUNITY)
Admission: EM | Admit: 2017-07-22 | Discharge: 2017-07-25 | Disposition: A | Discharge status: Home / Self Care | Payer: Medicare Other | Attending: Family Medicine | Admitting: Family Medicine

## 2017-07-22 ENCOUNTER — Ambulatory Visit (HOSPITAL_COMMUNITY): Payer: Medicare Other

## 2017-07-22 ENCOUNTER — Telehealth (HOSPITAL_COMMUNITY): Payer: Self-pay

## 2017-07-22 ENCOUNTER — Emergency Department (HOSPITAL_COMMUNITY): Payer: Medicare Other

## 2017-07-22 DIAGNOSIS — Z8711 Personal history of peptic ulcer disease: Secondary | ICD-10-CM

## 2017-07-22 DIAGNOSIS — M549 Dorsalgia, unspecified: Secondary | ICD-10-CM | POA: Diagnosis not present

## 2017-07-22 DIAGNOSIS — R002 Palpitations: Secondary | ICD-10-CM | POA: Insufficient documentation

## 2017-07-22 DIAGNOSIS — Z87891 Personal history of nicotine dependence: Secondary | ICD-10-CM | POA: Diagnosis not present

## 2017-07-22 DIAGNOSIS — K2270 Barrett's esophagus without dysplasia: Secondary | ICD-10-CM

## 2017-07-22 DIAGNOSIS — G8929 Other chronic pain: Secondary | ICD-10-CM | POA: Diagnosis not present

## 2017-07-22 DIAGNOSIS — S8292XA Unspecified fracture of left lower leg, initial encounter for closed fracture: Secondary | ICD-10-CM

## 2017-07-22 DIAGNOSIS — Z96652 Presence of left artificial knee joint: Secondary | ICD-10-CM | POA: Diagnosis not present

## 2017-07-22 DIAGNOSIS — I82492 Acute embolism and thrombosis of other specified deep vein of left lower extremity: Secondary | ICD-10-CM | POA: Insufficient documentation

## 2017-07-22 DIAGNOSIS — Z791 Long term (current) use of non-steroidal anti-inflammatories (NSAID): Secondary | ICD-10-CM | POA: Diagnosis not present

## 2017-07-22 DIAGNOSIS — I4891 Unspecified atrial fibrillation: Principal | ICD-10-CM | POA: Insufficient documentation

## 2017-07-22 DIAGNOSIS — Z885 Allergy status to narcotic agent status: Secondary | ICD-10-CM | POA: Diagnosis not present

## 2017-07-22 DIAGNOSIS — Z79899 Other long term (current) drug therapy: Secondary | ICD-10-CM | POA: Insufficient documentation

## 2017-07-22 DIAGNOSIS — Z7901 Long term (current) use of anticoagulants: Secondary | ICD-10-CM | POA: Insufficient documentation

## 2017-07-22 DIAGNOSIS — Z8719 Personal history of other diseases of the digestive system: Secondary | ICD-10-CM | POA: Insufficient documentation

## 2017-07-22 DIAGNOSIS — R600 Localized edema: Secondary | ICD-10-CM

## 2017-07-22 DIAGNOSIS — N400 Enlarged prostate without lower urinary tract symptoms: Secondary | ICD-10-CM

## 2017-07-22 DIAGNOSIS — Z96659 Presence of unspecified artificial knee joint: Secondary | ICD-10-CM

## 2017-07-22 DIAGNOSIS — Z96651 Presence of right artificial knee joint: Secondary | ICD-10-CM

## 2017-07-22 DIAGNOSIS — R0602 Shortness of breath: Secondary | ICD-10-CM

## 2017-07-22 LAB — CBC WITH DIFFERENTIAL/PLATELET
Basophils Absolute: 0 10*3/uL (ref 0.0–0.1)
Basophils Relative: 0 %
Eosinophils Absolute: 0.3 10*3/uL (ref 0.0–0.7)
Eosinophils Relative: 4 %
HCT: 38.3 % — ABNORMAL LOW (ref 39.0–52.0)
Hemoglobin: 12.9 g/dL — ABNORMAL LOW (ref 13.0–17.0)
Lymphocytes Relative: 31 %
Lymphs Abs: 2.2 10*3/uL (ref 0.7–4.0)
MCH: 30.7 pg (ref 26.0–34.0)
MCHC: 33.7 g/dL (ref 30.0–36.0)
MCV: 91.2 fL (ref 78.0–100.0)
Monocytes Absolute: 0.8 10*3/uL (ref 0.1–1.0)
Monocytes Relative: 11 %
Neutro Abs: 3.9 10*3/uL (ref 1.7–7.7)
Neutrophils Relative %: 54 %
Platelets: 235 10*3/uL (ref 150–400)
RBC: 4.2 MIL/uL — ABNORMAL LOW (ref 4.22–5.81)
RDW: 12.3 % (ref 11.5–15.5)
WBC: 7.2 10*3/uL (ref 4.0–10.5)

## 2017-07-22 LAB — I-STAT TROPONIN, ED: Troponin i, poc: 0 ng/mL (ref 0.00–0.08)

## 2017-07-22 MED ORDER — DILTIAZEM HCL 100 MG IV SOLR
5.0000 mg/h | Freq: Once | INTRAVENOUS | Status: AC
  Administered 2017-07-22: 5 mg/h via INTRAVENOUS

## 2017-07-22 MED ORDER — DEXTROSE 5 % IV SOLN
INTRAVENOUS | Status: AC
  Filled 2017-07-22: qty 100

## 2017-07-22 NOTE — Telephone Encounter (Signed)
No show #2;  Called and spoke to pt. who reported MD has transfered him to a different rehab.  Cancelled all further apts and left contact info if pt. has any further questions.    [address] [place]; [facility] [phone]

## 2017-07-22 NOTE — ED Triage Notes (Signed)
Pt states feeling like heart irregular & beating hard at times. Has been going on for the past 3 hours. Denies any chest pain.

## 2017-07-22 NOTE — ED Provider Notes (Addendum)
[facility] DEPT Provider Note   CSN: [id number] Arrival date & time: 07/22/17  2309     History   Chief Complaint Chief Complaint  Patient presents with  . Palpitations    HPI Christopher [name] is a 71 y.o. male.  Patient presents to the emergency department for evaluation of heart palpitations. Patient reports that for the last 3 hours or so he has been experiencing a fast heartbeat that has been pounding in his chest and neck. He does not have associated shortness of breath or chest pain. He reports that he had a similar episode approximately one month ago that lasted for half of a night and then resolved on its own. He did not seek medical care at that time.      Past Medical History:  Diagnosis Date  . Arthritis    "all over my body" (06/17/2017)  . BPH (benign prostatic hyperplasia)    takes Proscar daily  . Chronic back pain    DDD; "upper and lower" (06/17/2017)  . Constipation    takes Colace daily  . DDD (degenerative disc disease), cervical   . Enlarged prostate   . GERD (gastroesophageal reflux disease)    takes Nexium daily  . H/O hiatal hernia   . History of colon polyps    benign  . History of gastric ulcer   . Insomnia    takes Xanax nightly  . Joint capsule tear    left knee  . Joint pain   . Joint swelling   . Macular degeneration   . Muscle spasm of back    takes Valium daily as needed  . Peripheral neuropathy    takes Gabapentin daily  . PONV (postoperative nausea and vomiting)   . Urinary hesitancy   . Weakness    numbness and tingling    Patient Active Problem List   Diagnosis Date Noted  . 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 Spondylolisthesis of lumbar region 03/05/2014  . Barrett esophagus 02/24/2013  . Personal history of colonic polyps 02/24/2013  . Epigastric pain 01/06/2013  . Encounter for screening colonoscopy 01/06/2013  . Pain in joint, shoulder region 07/09/2012  . Muscle weakness (generalized) 07/09/2012    Past  Surgical History:  Procedure Laterality Date  . ANTERIOR CERVICAL DECOMP/DISCECTOMY FUSION  ~ 1991   "took bone from hip"  . ANTERIOR CERVICAL DECOMP/DISCECTOMY FUSION  X 2   "put plate in"  . ANTERIOR CERVICAL DISCECTOMY  <11/2005    5-6 and 4-5./notes 05/02/2011  . APPENDECTOMY    . BACK SURGERY    . CARDIAC CATHETERIZATION    . CARPAL TUNNEL RELEASE Bilateral 10/2005 - 11/2005   right-left Archie Endo 05/02/2011  . CATARACT EXTRACTION W/PHACO Right 05/24/2014   Procedure: CATARACT EXTRACTION PHACO AND INTRAOCULAR LENS PLACEMENT (IOC);  Surgeon: [name], MD;  Location: [facility];  Service: Ophthalmology;  Laterality: Right;  CDE 6.74  . CATARACT EXTRACTION W/PHACO Left 06/17/2014   Procedure: CATARACT EXTRACTION PHACO AND INTRAOCULAR LENS PLACEMENT (IOC);  Surgeon: [name], MD;  Location: [facility];  Service: Ophthalmology;  Laterality: Left;  CDE:4.65  . COLONOSCOPY WITH ESOPHAGOGASTRODUODENOSCOPY (EGD) N/A 01/22/2013   Procedure: COLONOSCOPY WITH ESOPHAGOGASTRODUODENOSCOPY (EGD);  Surgeon: [name], MD;  [facility];  Service: Endoscopy;  Laterality: N/A;  140  . EYE SURGERY Bilateral    "cleaned film w/laser; since my 1st cataract OR"  . JOINT REPLACEMENT    . POSTERIOR LUMBAR FUSION     "put a  plate in"  . QUADRICEPS TENDON REPAIR Left 07/01/2017  . QUADRICEPS TENDON REPAIR Left 07/01/2017   Procedure: REPAIR QUADRICEP TENDON;  Surgeon: [name], MD;  Location: [facility];  Service: Orthopedics;  Laterality: Left;  . SHOULDER ARTHROSCOPY Bilateral   . SPINAL CORD STIMULATOR INSERTION N/A 09/23/2015   Procedure: LUMBAR SPINAL CORD STIMULATOR INSERTION;  Surgeon: [name], MD;  Location: [facility] NEURO ORS;  Service: Neurosurgery;  Laterality: N/A;  LUMBAR SPINAL CORD STIMULATOR INSERTION  . TOTAL KNEE ARTHROPLASTY Left 06/17/2017  . TOTAL KNEE ARTHROPLASTY Left 06/17/2017   Procedure: TOTAL KNEE ARTHROPLASTY;  Surgeon: [name], MD;  Location: [facility];  Service: Orthopedics;   Laterality: Left;       Home Medications    Prior to Admission medications   Medication Sig Start Date End Date Taking? Authorizing Provider  alfuzosin (UROXATRAL) 10 MG 24 hr tablet Take 10 mg by mouth daily.   Yes [provider]  ALPRAZolam Duanne Moron) 1 MG tablet Take 1 mg by mouth at bedtime.    Yes [provider]  aspirin EC 325 MG EC tablet Take 1 tablet (325 mg total) by mouth 2 (two) times daily. 06/18/17  Yes Donia Ast, PA  B Complex-C (SUPER B COMPLEX PO) Take 1 tablet by mouth daily.   Yes [provider]  Cholecalciferol (VITAMIN D3) 2000 units capsule Take 2,000 Units by mouth daily.   Yes [provider]  docusate sodium (COLACE) 100 MG capsule Take 200 mg by mouth 2 (two) times daily.    Yes [provider]  esomeprazole (NEXIUM) 40 MG capsule Take 40 mg by mouth daily.    Yes [provider]  gabapentin (NEURONTIN) 400 MG capsule Take 400 mg by mouth 2 (two) times daily.    Yes [provider]  Glucosamine HCl 1500 MG TABS Take 1,500 mg by mouth daily.   Yes [provider]  HYDROcodone-acetaminophen (NORCO) 10-325 MG tablet Take 1 tablet by mouth 3 (three) times daily as needed for moderate pain.   Yes [provider]  Liniments (SALONPAS PAIN RELIEF PATCH EX) Apply 2 patches topically daily as needed (back pain).    Yes [provider]  methocarbamol (ROBAXIN) 500 MG tablet Take 1-2 tablets (500-1,000 mg total) by mouth every 6 (six) hours as needed for muscle spasms. 06/18/17  Yes Donia Ast, [place]  Omega-3 Fatty Acids (FISH OIL ULTRA) 1400 MG CAPS Take 1,400 mg by mouth daily.   Yes [provider]  oxyCODONE 10 MG TABS Take 0.5-1 tablets (5-10 mg total) by mouth every 3 (three) hours as needed for breakthrough pain. 06/18/17  Yes Donia Ast, PA  tamsulosin (FLOMAX) 0.4 MG CAPS capsule Take 0.4 mg by mouth daily.   Yes [provider]    Family  History No family history on file.  Social History Social History  Substance Use Topics  . Smoking status: Former Smoker    Packs/day: 1.00    Years: 30.00    Types: Cigarettes  . Smokeless tobacco: Former Systems developer    Types: Chew     Comment: quit smoking  & chewing by [place]  . Alcohol use 12.6 oz/week    9 Glasses of wine, 12 Cans of beer per week     Comment:  "none since surgery " 06/28/17     Allergies   Morphine and related and Propofol   Review of Systems Review of Systems  Cardiovascular: Positive for palpitations.  All other  systems reviewed and are negative.    Physical Exam Updated Vital Signs BP 111/84   Pulse (!) 115   Temp 98.7 F (37.1 C)   Resp (!) 24   Ht 5\' 8"  (1.727 m)   Wt 73.5 kg (162 lb)   SpO2 93%   BMI 24.63 kg/m   Physical Exam  Constitutional: He is oriented to person, place, and time. He appears well-developed and well-nourished. No distress.  HENT:  Head: Normocephalic and atraumatic.  Right Ear: Hearing normal.  Left Ear: Hearing normal.  Nose: Nose normal.  Mouth/Throat: Oropharynx is clear and moist and mucous membranes are normal.  Eyes: Pupils are equal, round, and reactive to light. Conjunctivae and EOM are normal.  Neck: Normal range of motion. Neck supple.  Cardiovascular: S1 normal and S2 normal.  An irregularly irregular rhythm present. Tachycardia present.  Exam reveals no gallop and no friction rub.   No murmur heard. Pulmonary/Chest: Effort normal and breath sounds normal. No respiratory distress. He exhibits no tenderness.  Abdominal: Soft. Normal appearance and bowel sounds are normal. There is no hepatosplenomegaly. There is no tenderness. There is no rebound, no guarding, no tenderness at McBurney's point and negative Murphy's sign. No hernia.  Musculoskeletal: Normal range of motion.  Neurological: He is alert and oriented to person, place, and time. He has normal strength. No cranial nerve deficit or sensory deficit.  Coordination normal. GCS eye subscore is 4. GCS verbal subscore is 5. GCS motor subscore is 6.  Skin: Skin is warm, dry and intact. No rash noted. No cyanosis.  Psychiatric: He has a normal mood and affect. His speech is normal and behavior is normal. Thought content normal.  Nursing note and vitals reviewed.    ED Treatments / Results  Labs (all labs ordered are listed, but only abnormal results are displayed) Labs Reviewed  CBC WITH DIFFERENTIAL/PLATELET - Abnormal; Notable for the following:       Result Value   RBC 4.20 (*)    Hemoglobin 12.9 (*)    HCT 38.3 (*)    All other components within normal limits  BASIC METABOLIC PANEL - Abnormal; Notable for the following:    Potassium 3.4 (*)    Glucose, Bld 142 (*)    All other components within normal limits  APTT  PROTIME-INR  I-STAT TROPONIN, ED    EKG  EKG Interpretation  Date/Time:  Monday July 22 2017 23:23:44 EDT Ventricular Rate:  143 PR Interval:    QRS Duration: 91 QT Interval:  311 QTC Calculation: 480 R Axis:   -2 Text Interpretation:  Atrial fibrillation Low voltage, precordial leads RSR' in V1 or V2, right VCD or RVH Borderline T abnormalities, anterior leads Borderline prolonged QT interval Confirmed by Orpah Greek ([id number]) on 07/22/2017 11:35:27 PM       Radiology Dg Chest Port 1 View  Result Date: 07/23/2017 CLINICAL DATA:  Chest palpitations, former smoker. EXAM: PORTABLE CHEST 1 VIEW COMPARISON:  None. FINDINGS: Heart is top-normal in size. No aortic aneurysm. Neural stimulator device projects up to the T8 level. The patient is status post ACDF of the cervical spine with partial inclusion of cervical hardware. There is minimal atelectasis at the left lung base. No pneumonic consolidation, effusion or pneumothorax. Osteoarthritis of the right AC joint. IMPRESSION: No active disease. Electronically Signed   By: Ashley Royalty M.D.   On: 07/23/2017 00:03    Procedures Procedures (including  critical care time)  Medications Ordered in ED Medications  heparin ADULT infusion 100 units/mL (25000 units/264mL sodium chloride 0.45%) (not administered)  diltiazem (CARDIZEM) 100 mg in dextrose 5 % 100 mL (1 mg/mL) infusion (12.5 mg/hr Intravenous Rate/Dose Change 07/23/17 0105)  heparin bolus via infusion 3,500 Units (3,500 Units Intravenous Bolus from Bag 07/23/17 0111)     Initial Impression / Assessment and Plan / ED Course  I have reviewed the triage vital signs and the nursing notes.  Pertinent labs & imaging results that were available during my care of the patient were reviewed by me and considered in my medical decision making (see chart for details).    Patient presents to the emergency department for evaluation of palpitations. Patient found to be in atrial fibrillation with rapid ventricular response at arrival. He reports a similar episode that lasted several hours approximately one month ago.  Patient does not have any other cardiac history. CHA2DS2-VASc Score is 1. Patient was placed on Cardizem drip and is achieving rate control. Blood pressure is remaining normal.  I discussed the care of the patient with Dr. Kenton Kingfisher, on-call for cardiology. Based on the fact that the patient had an episode that sounds convincing for atrial fibrillation a month ago, it is unclear if he has been having intermittent episodes of paroxysmal atrial fibrillation. Because of this, it is not recommended to perform cardioversion. Patient will therefore be admitted to the hospital for further management.  It should be noted the patient did undergo left total knee replacement 3 weeks ago. He is not experiencing any significant leg swelling. He does not have any chest pain including pleuritic pain and there is no shortness of breath or hypoxia. I do not suspect PE as a cause of his atrial fibrillation.   CHA2DS2-VASc Score for Atrial Fibrillation Stroke Risk from [email or web address]  on 07/23/2017 ** All  calculations should be rechecked by clinician prior to use **  RESULT SUMMARY: 1 points Stroke risk was 0.6% per year in >90,000 patients (the [place] Atrial Fibrillation Cohort Study) and 0.9% risk of stroke/TIA/systemic embolism.  One recommendation suggests a 0 score is "low" risk and may not require anticoagulation; a 1 score is "low-moderate" risk and should consider antiplatelet or anticoagulation, and score 2 or greater is "moderate-high" risk and should otherwise be an anticoagulation candidate.   INPUTS: Age -> 1 = 65-74 Sex -> 0 = Male <abbr title='Congestive heart failure'>CHF</abbr> history -> 0 = No Hypertension history -> 0 = No Stroke / TIA / Thromboembolism history -> 0 = No Vascular disease history -> 0 = No Diabetes history -> 0 = No  CRITICAL CARE Performed by: Orpah Greek.   Total critical care time: 30 minutes  Critical care time was exclusive of separately billable procedures and treating other patients.  Critical care was necessary to treat or prevent imminent or life-threatening deterioration.  Critical care was time spent personally by me on the following activities: development of treatment plan with patient and/or surrogate as well as nursing, discussions with consultants, evaluation of patient's response to treatment, examination of patient, obtaining history from patient or surrogate, ordering and performing treatments and interventions, ordering and review of laboratory studies, ordering and review of radiographic studies, pulse oximetry and re-evaluation of patient's condition.   Final Clinical Impressions(s) / ED Diagnoses   Final diagnoses:  Atrial fibrillation with [facility])    New Prescriptions New Prescriptions   No medications on file     Orpah Greek, MD 07/23/17 0111    Orpah Greek, MD 07/23/17  0113  

## 2017-07-23 ENCOUNTER — Observation Stay (HOSPITAL_COMMUNITY): Payer: Medicare Other

## 2017-07-23 ENCOUNTER — Observation Stay (HOSPITAL_BASED_OUTPATIENT_CLINIC_OR_DEPARTMENT_OTHER): Payer: Medicare Other

## 2017-07-23 ENCOUNTER — Encounter (HOSPITAL_COMMUNITY): Payer: Self-pay | Admitting: Family Medicine

## 2017-07-23 DIAGNOSIS — N400 Enlarged prostate without lower urinary tract symptoms: Secondary | ICD-10-CM | POA: Diagnosis present

## 2017-07-23 DIAGNOSIS — Z96659 Presence of unspecified artificial knee joint: Secondary | ICD-10-CM | POA: Diagnosis not present

## 2017-07-23 DIAGNOSIS — G8929 Other chronic pain: Secondary | ICD-10-CM | POA: Diagnosis not present

## 2017-07-23 DIAGNOSIS — I4891 Unspecified atrial fibrillation: Secondary | ICD-10-CM

## 2017-07-23 DIAGNOSIS — M549 Dorsalgia, unspecified: Secondary | ICD-10-CM

## 2017-07-23 DIAGNOSIS — Z8719 Personal history of other diseases of the digestive system: Secondary | ICD-10-CM

## 2017-07-23 DIAGNOSIS — Z8711 Personal history of peptic ulcer disease: Secondary | ICD-10-CM

## 2017-07-23 LAB — CBC
HCT: 38.2 % — ABNORMAL LOW (ref 39.0–52.0)
Hemoglobin: 12.8 g/dL — ABNORMAL LOW (ref 13.0–17.0)
MCH: 30.8 pg (ref 26.0–34.0)
MCHC: 33.5 g/dL (ref 30.0–36.0)
MCV: 92 fL (ref 78.0–100.0)
Platelets: 229 10*3/uL (ref 150–400)
RBC: 4.15 MIL/uL — ABNORMAL LOW (ref 4.22–5.81)
RDW: 12.6 % (ref 11.5–15.5)
WBC: 7.5 10*3/uL (ref 4.0–10.5)

## 2017-07-23 LAB — BASIC METABOLIC PANEL
Anion gap: 10 (ref 5–15)
Anion gap: 9 (ref 5–15)
BUN: 11 mg/dL (ref 6–20)
BUN: 11 mg/dL (ref 6–20)
CO2: 24 mmol/L (ref 22–32)
CO2: 28 mmol/L (ref 22–32)
Calcium: 9.3 mg/dL (ref 8.9–10.3)
Calcium: 9.7 mg/dL (ref 8.9–10.3)
Chloride: 106 mmol/L (ref 101–111)
Chloride: 108 mmol/L (ref 101–111)
Creatinine, Ser: 0.84 mg/dL (ref 0.61–1.24)
Creatinine, Ser: 0.89 mg/dL (ref 0.61–1.24)
GFR calc Af Amer: >60 mL/min (ref >60)
GFR calc Af Amer: >60 mL/min (ref >60)
GFR calc non Af Amer: >60 mL/min (ref >60)
GFR calc non Af Amer: >60 mL/min (ref >60)
Glucose, Bld: 142 mg/dL — ABNORMAL HIGH (ref 65–99)
Glucose, Bld: 150 mg/dL — ABNORMAL HIGH (ref 65–99)
Potassium: 3.4 mmol/L — ABNORMAL LOW (ref 3.5–5.1)
Potassium: 3.9 mmol/L (ref 3.5–5.1)
Sodium: 142 mmol/L (ref 135–145)
Sodium: 143 mmol/L (ref 135–145)

## 2017-07-23 LAB — TROPONIN I
Troponin I: <0.03 ng/mL (ref <0.03)
Troponin I: <0.03 ng/mL (ref <0.03)
Troponin I: <0.03 ng/mL (ref <0.03)

## 2017-07-23 LAB — D-DIMER, QUANTITATIVE: D-Dimer, Quant: 3.31 ug/mL-FEU — ABNORMAL HIGH (ref 0.00–0.50)

## 2017-07-23 LAB — TSH: TSH: 2.656 u[IU]/mL (ref 0.350–4.500)

## 2017-07-23 LAB — ECHOCARDIOGRAM COMPLETE
Height: 68 in
Weight: 2543.23 oz

## 2017-07-23 LAB — HEPARIN LEVEL (UNFRACTIONATED): Heparin Unfractionated: 0.32 IU/mL (ref 0.30–0.70)

## 2017-07-23 LAB — PROTIME-INR
INR: 1.02
Prothrombin Time: 13.4 seconds (ref 11.4–15.2)

## 2017-07-23 LAB — MRSA PCR SCREENING: MRSA by PCR: NEGATIVE

## 2017-07-23 LAB — APTT: aPTT: 30 seconds (ref 24–36)

## 2017-07-23 MED ORDER — DOCUSATE SODIUM 100 MG PO CAPS
200.0000 mg | ORAL_CAPSULE | Freq: Two times a day (BID) | ORAL | Status: DC
Start: 2017-07-24 — End: 2017-07-25
  Administered 2017-07-24 – 2017-07-25 (×3): 200 mg via ORAL
  Filled 2017-07-23 (×3): qty 2

## 2017-07-23 MED ORDER — SODIUM CHLORIDE 0.9 % IV SOLN: 250.0000 mL | INTRAVENOUS | Status: DC | PRN

## 2017-07-23 MED ORDER — DILTIAZEM HCL 60 MG PO TABS
60.0000 mg | ORAL_TABLET | Freq: Three times a day (TID) | ORAL | Status: DC
  Administered 2017-07-23 – 2017-07-24 (×4): 60 mg via ORAL
  Filled 2017-07-23 (×4): qty 1

## 2017-07-23 MED ORDER — GABAPENTIN 400 MG PO CAPS
400.0000 mg | ORAL_CAPSULE | Freq: Two times a day (BID) | ORAL | Status: DC
  Administered 2017-07-23 – 2017-07-25 (×4): 400 mg via ORAL
  Filled 2017-07-23 (×4): qty 1

## 2017-07-23 MED ORDER — HYDROCODONE-ACETAMINOPHEN 10-325 MG PO TABS: 1.0000 | ORAL_TABLET | Freq: Three times a day (TID) | ORAL | Status: DC | PRN

## 2017-07-23 MED ORDER — ALPRAZOLAM 0.5 MG PO TABS
1.0000 mg | ORAL_TABLET | Freq: Once | ORAL | Status: AC
  Administered 2017-07-23: 1 mg via ORAL
  Filled 2017-07-23: qty 2

## 2017-07-23 MED ORDER — ASPIRIN EC 81 MG PO TBEC
81.0000 mg | DELAYED_RELEASE_TABLET | Freq: Every day | ORAL | Status: DC
  Administered 2017-07-23 – 2017-07-25 (×3): 81 mg via ORAL
  Filled 2017-07-23 (×3): qty 1

## 2017-07-23 MED ORDER — ALPRAZOLAM 0.5 MG PO TABS
1.0000 mg | ORAL_TABLET | Freq: Every day | ORAL | Status: DC
  Administered 2017-07-23 – 2017-07-24 (×2): 1 mg via ORAL
  Filled 2017-07-23 (×2): qty 2

## 2017-07-23 MED ORDER — SODIUM CHLORIDE 0.9% FLUSH
3.0000 mL | Freq: Two times a day (BID) | INTRAVENOUS | Status: DC
  Administered 2017-07-23 – 2017-07-25 (×5): 3 mL via INTRAVENOUS

## 2017-07-23 MED ORDER — HEPARIN BOLUS VIA INFUSION
3500.0000 [IU] | Freq: Once | INTRAVENOUS | Status: AC
  Administered 2017-07-23: 3500 [IU] via INTRAVENOUS

## 2017-07-23 MED ORDER — MELOXICAM 7.5 MG PO TABS
15.0000 mg | ORAL_TABLET | Freq: Every day | ORAL | Status: DC
  Administered 2017-07-24 – 2017-07-25 (×2): 15 mg via ORAL
  Filled 2017-07-23: qty 2
  Filled 2017-07-23: qty 1
  Filled 2017-07-23: qty 2

## 2017-07-23 MED ORDER — VITAMIN D 1000 UNITS PO TABS
2000.0000 [IU] | ORAL_TABLET | Freq: Every day | ORAL | Status: DC
  Administered 2017-07-24 – 2017-07-25 (×2): 2000 [IU] via ORAL
  Filled 2017-07-23 (×2): qty 2

## 2017-07-23 MED ORDER — TAMSULOSIN HCL 0.4 MG PO CAPS
0.4000 mg | ORAL_CAPSULE | Freq: Every day | ORAL | Status: DC
  Administered 2017-07-23 – 2017-07-25 (×3): 0.4 mg via ORAL
  Filled 2017-07-23 (×3): qty 1

## 2017-07-23 MED ORDER — SODIUM CHLORIDE 0.9% FLUSH: 3.0000 mL | INTRAVENOUS | Status: DC | PRN

## 2017-07-23 MED ORDER — DILTIAZEM HCL 100 MG IV SOLR
5.0000 mg/h | Freq: Once | INTRAVENOUS | Status: AC
  Administered 2017-07-23: 15 mg/h via INTRAVENOUS
  Filled 2017-07-23: qty 100

## 2017-07-23 MED ORDER — HEPARIN (PORCINE) IN NACL 100-0.45 UNIT/ML-% IJ SOLN
1100.0000 [IU]/h | INTRAMUSCULAR | Status: DC
  Administered 2017-07-23 (×3): 1100 [IU]/h via INTRAVENOUS
  Filled 2017-07-23 (×3): qty 250

## 2017-07-23 MED ORDER — PANTOPRAZOLE SODIUM 40 MG PO TBEC
80.0000 mg | DELAYED_RELEASE_TABLET | Freq: Every day | ORAL | Status: DC
  Administered 2017-07-24 – 2017-07-25 (×2): 80 mg via ORAL
  Filled 2017-07-23 (×2): qty 2

## 2017-07-23 MED ORDER — ALFUZOSIN HCL ER 10 MG PO TB24
10.0000 mg | ORAL_TABLET | Freq: Every day | ORAL | Status: DC
  Administered 2017-07-24 – 2017-07-25 (×2): 10 mg via ORAL
  Filled 2017-07-23 (×3): qty 1

## 2017-07-23 MED ORDER — METHOCARBAMOL 500 MG PO TABS: 500.0000 mg | ORAL_TABLET | Freq: Four times a day (QID) | ORAL | Status: DC | PRN

## 2017-07-23 MED ORDER — B COMPLEX-C PO TABS
1.0000 | ORAL_TABLET | Freq: Every day | ORAL | Status: DC
  Administered 2017-07-24 – 2017-07-25 (×2): 1 via ORAL
  Filled 2017-07-23 (×3): qty 1

## 2017-07-23 MED ORDER — IOPAMIDOL (ISOVUE-370) INJECTION 76%
100.0000 mL | Freq: Once | INTRAVENOUS | Status: AC | PRN
  Administered 2017-07-23: 100 mL via INTRAVENOUS

## 2017-07-23 NOTE — Plan of Care (Signed)
Problem: Cardiac: Goal: Ability to achieve and maintain adequate cardiopulmonary perfusion will improve Outcome: Progressing Patient off Cardizem drip now and maintaining heart rates in the 60-70s. Patient will be given PO Cardizem now.

## 2017-07-23 NOTE — Progress Notes (Signed)
ANTICOAGULATION CONSULT NOTE - Preliminary  Pharmacy Consult for Heparin infusion Indication:Atrial fibrillation  Allergies  Allergen Reactions  . Morphine And Related Nausea And Vomiting  . Propofol Nausea And Vomiting    Patient Measurements: Height: 5\' 8"  (172.7 cm) Weight: 162 lb (73.5 kg) IBW/kg (Calculated) : 68.4 HEPARIN DW (KG): 73.5   Vital Signs: Temp: 98.7 F (37.1 C) (08/06 2325) BP: 111/84 (08/07 0030) Pulse Rate: 115 (08/07 0030)  Labs:  Recent Labs  07/22/17 2334  HGB 12.9*  HCT 38.3*  PLT 235  CREATININE 0.84   Estimated Creatinine Clearance: 76.9 mL/min (by C-G formula based on SCr of 0.84 mg/dL).  Medical History: Past Medical History:  Diagnosis Date  . Arthritis    "all over my body" (06/17/2017)  . BPH (benign prostatic hyperplasia)    takes Proscar daily  . Chronic back pain    DDD; "upper and lower" (06/17/2017)  . Constipation    takes Colace daily  . DDD (degenerative disc disease), cervical   . Enlarged prostate   . GERD (gastroesophageal reflux disease)    takes Nexium daily  . H/O hiatal hernia   . History of colon polyps    benign  . History of gastric ulcer   . Insomnia    takes Xanax nightly  . Joint capsule tear    left knee  . Joint pain   . Joint swelling   . Macular degeneration   . Muscle spasm of back    takes Valium daily as needed  . Peripheral neuropathy    takes Gabapentin daily  . PONV (postoperative nausea and vomiting)   . Urinary hesitancy   . Weakness    numbness and tingling    Medications:   Assessment: 71 yo male seen in the for irregular heart beat x 3 hours. Recurrence of symptoms in recent past without medical intervention. Pt is s/p L knee surgery on 7/16 for tendon/capsular repair. Pharmacy has been asked to provide dosing for IV heparin.  Goal of Therapy:  Heparin level goal 0.3-0.7 units/ml Monitor platelets by anticoagulation protocol: Yes   Plan:  Heparin 3500 unit bolus Heparin  infusion 1100 units/hr 6 hour heparin level  Preliminary review of pertinent patient information completed.  Forestine Na clinical pharmacist will complete review during morning rounds to assess the patient and finalize treatment regimen.  Norberto Sorenson, Gisela 07/23/2017,1:00 AM

## 2017-07-23 NOTE — Progress Notes (Signed)
*  PRELIMINARY RESULTS* Echocardiogram 2D Echocardiogram has been performed.  Leavy Cella 07/23/2017, 4:01 PM

## 2017-07-23 NOTE — Progress Notes (Signed)
RN paged primary provider group but no response at this time. Pt has requested home meds be ordered for him as he takes his PTA meds everyday and was told he would have them here while in the hospital. There are several listed that are not ordered at this time.   Adoni Greenough Rica Mote, RN 9:19 PM

## 2017-07-23 NOTE — Care Management Obs Status (Signed)
[facility] NOTIFICATION   Patient Details  Name: Christopher [name] MRN: [id number] Date of Birth: [birth year]   Medicare Observation Status Notification Given:  Yes    Yuko Coventry, Chauncey Reading, RN 07/23/2017, 3:03 PM

## 2017-07-23 NOTE — Progress Notes (Signed)
I appreciate the notes and expertise of cardiology currently on subcutaneous heparin for DVT prophylaxis after conversion to sinus rhythm patient is hemodynamically stable no complaints of chest pain lower extremity ultrasound ordered Christopher [name] [id number] DOB: 1946-08-30 DOA: 07/22/2017 PCP: Christopher Gaskins, MD   Physical Exam: Blood pressure 108/71, pulse (!) 58, temperature 98.1 F (36.7 C), temperature source Oral, resp. rate (!) 23, height 5\' 8"  (1.727 [name]), weight 72.1 kg (158 lb 15.2 oz), SpO2 95 %. Lungs clear to A&P no rales wheeze rhonchi some diminished breath sounds in bases no rales audible heart regular rhythm no S3 or S4 no heaves thrills rubs   Investigations:  Recent Results (from the past 240 hour(s))  MRSA PCR Screening     Status: None   Collection Time: 07/23/17  2:28 AM  Result Value Ref Range Status   MRSA by PCR NEGATIVE NEGATIVE Final    Comment:        The GeneXpert MRSA Assay (FDA approved for NASAL specimens only), is one component of a comprehensive MRSA colonization surveillance program. It is not intended to diagnose MRSA infection nor to guide or monitor treatment for MRSA infections.      Basic Metabolic Panel:  Recent Labs  07/22/17 2334 07/23/17 0300  NA 142 143  K 3.4* 3.9  CL 108 106  CO2 24 28  GLUCOSE 142* 150*  BUN 11 11  CREATININE 0.84 0.89  CALCIUM 9.7 9.3   Liver Function Tests: No results for input(s): AST, ALT, ALKPHOS, BILITOT, PROT, ALBUMIN in the last 72 hours.   CBC:  Recent Labs  07/22/17 2334 07/23/17 0300  WBC 7.2 7.5  NEUTROABS 3.9  --   HGB 12.9* 12.8*  HCT 38.3* 38.2*  MCV 91.2 92.0  PLT 235 229    Ct Angio Chest Pe W Or Wo Contrast  Result Date: 07/23/2017 CLINICAL DATA:  Shortness of breath. EXAM: CT ANGIOGRAPHY CHEST WITH CONTRAST TECHNIQUE: Multidetector CT imaging of the chest was performed using the standard protocol during bolus administration of intravenous contrast. Multiplanar CT image  reconstructions and MIPs were obtained to evaluate the vascular anatomy. CONTRAST:  100 mL of Isovue 370 intravenously. COMPARISON:  CT scan of March 07, 2016. FINDINGS: Cardiovascular: Satisfactory opacification of the pulmonary arteries to the segmental level. No evidence of pulmonary embolism. Normal heart size. No pericardial effusion. Coronary calcifications are noted. Mediastinum/Nodes: No enlarged mediastinal, hilar, or axillary lymph nodes. Thyroid gland, trachea, and esophagus demonstrate no significant findings. Lungs/Pleura: Mild right apical scarring is noted. No pneumothorax or pleural effusion is noted. Stable 4 mm nodule is noted in left lung base, which can be considered benign at this point with no further follow-up required. Upper Abdomen: No acute abnormality. Musculoskeletal: No chest wall abnormality. No acute or significant osseous findings. Review of the MIP images confirms the above findings. IMPRESSION: No evidence of pulmonary embolus. Coronary artery calcifications are noted suggesting coronary artery disease. Aortic Atherosclerosis (ICD10-I70.0). Electronically Signed   By: Marijo Conception, [name].D.   On: 07/23/2017 11:54   Dg Chest Port 1 View  Result Date: 07/23/2017 CLINICAL DATA:  Chest palpitations, former smoker. EXAM: PORTABLE CHEST 1 VIEW COMPARISON:  None. FINDINGS: Heart is top-normal in size. No aortic aneurysm. Neural stimulator device projects up to the T8 level. The patient is status post ACDF of the cervical spine with partial inclusion of cervical hardware. There is minimal atelectasis at the left lung base. No pneumonic consolidation, effusion or pneumothorax. Osteoarthritis of the  right AC joint. IMPRESSION: No active disease. Electronically Signed   By: Ashley Royalty [name].D.   On: 07/23/2017 00:03      Medications:   Impression:  Principal Problem:   Atrial fibrillation with RVR (HCC) Active Problems:   S/P total knee replacement   History of gastric ulcer    Chronic back pain   BPH (benign prostatic hyperplasia)     Plan: Subcutaneous Lovenox for DVT prophylaxis lower extremity ultrasound to rule out DVT ambulation possible transfer to telemetry resume Flomax 0.4 by mouth daily for BPH  Consultants: Cardiology    Procedures   Antibiotics:        Time spent: 30 minutes   LOS: 0 days   Christopher [name]   07/23/2017, 12:49 PM

## 2017-07-23 NOTE — Progress Notes (Signed)
Paged Dr. Cindie Laroche to notify him of left occlusive peroneal vein thrombus. No response as of this time.

## 2017-07-23 NOTE — H&P (Signed)
History and Physical    Christopher [name] YKZ:[id number] DOB: 08/08/1946 DOA: 07/22/2017  PCP: [name], MD  Patient coming from:  home  Chief Complaint:  palpitations  HPI: Christopher [name] is a 71 y.o. male with medical history significant of BPH, chronic back pain comes in with a day of palpitations and feeling his heart race and be irregular.  He denies any fevers, sob, hemoptysis.  Pt had left knee replacement 3 weeks ago.  Reports he fell before leaving hospital and they had to actually open it up again a couple days after his initial surgery.  He has been home recovering well.  His leg was very swollen all the way down to the foot several days after the initial surgery.  The swelling has markedly gone down and now only with some mild knee swelling.  No fevers.  The incision is clean and well healing.  Pt denies any chest pain or pleuritic chest pain.  He denies any DOE.  Pt reports he has one previous episode of feeling the same palpitations and does not remember if it was after his surgery or before but it was recent.  He has no cardiac history.  No previous h/o VTE and has had many surgeries in the past.  Pt referred for admission for new onset afib with rvr.  On hep and dilt drip at this time.  Review of Systems: As per HPI otherwise 10 point review of systems negative.   Past Medical History:  Diagnosis Date  . Arthritis    "all over my body" (06/17/2017)  . BPH (benign prostatic hyperplasia)    takes Proscar daily  . Chronic back pain    DDD; "upper and lower" (06/17/2017)  . Constipation    takes Colace daily  . DDD (degenerative disc disease), cervical   . Enlarged prostate   . GERD (gastroesophageal reflux disease)    takes Nexium daily  . H/O hiatal hernia   . History of colon polyps    benign  . History of gastric ulcer   . Insomnia    takes Xanax nightly  . Joint capsule tear    left knee  . Joint pain   . Joint swelling   . Macular degeneration   . Muscle spasm  of back    takes Valium daily as needed  . Peripheral neuropathy    takes Gabapentin daily  . PONV (postoperative nausea and vomiting)   . Urinary hesitancy   . Weakness    numbness and tingling    Past Surgical History:  Procedure Laterality Date  . ANTERIOR CERVICAL DECOMP/DISCECTOMY FUSION  ~ 1991   "took bone from hip"  . ANTERIOR CERVICAL DECOMP/DISCECTOMY FUSION  X 2   "put plate in"  . ANTERIOR CERVICAL DISCECTOMY  <11/2005    5-6 and 4-5./notes 05/02/2011  . APPENDECTOMY    . BACK SURGERY    . CARDIAC CATHETERIZATION    . CARPAL TUNNEL RELEASE Bilateral 10/2005 - 11/2005   right-left Archie Endo 05/02/2011  . CATARACT EXTRACTION W/PHACO Right 05/24/2014   Procedure: CATARACT EXTRACTION PHACO AND INTRAOCULAR LENS PLACEMENT (IOC);  Surgeon: [name], MD;  Location: [facility];  Service: Ophthalmology;  Laterality: Right;  CDE 6.74  . CATARACT EXTRACTION W/PHACO Left 06/17/2014   Procedure: CATARACT EXTRACTION PHACO AND INTRAOCULAR LENS PLACEMENT (IOC);  Surgeon: [name], MD;  Location: [facility];  Service: Ophthalmology;  Laterality: Left;  CDE:4.65  . COLONOSCOPY WITH ESOPHAGOGASTRODUODENOSCOPY (EGD) N/A 01/22/2013   Procedure:  COLONOSCOPY WITH ESOPHAGOGASTRODUODENOSCOPY (EGD);  Surgeon: [name], MD;  Location: [facility];  Service: Endoscopy;  Laterality: N/A;  140  . EYE SURGERY Bilateral    "cleaned film w/laser; since my 1st cataract OR"  . JOINT REPLACEMENT    . POSTERIOR LUMBAR FUSION     "put a plate in"  . QUADRICEPS TENDON REPAIR Left 07/01/2017  . QUADRICEPS TENDON REPAIR Left 07/01/2017   Procedure: REPAIR QUADRICEP TENDON;  Surgeon: [name], MD;  Location: [facility];  Service: Orthopedics;  Laterality: Left;  . SHOULDER ARTHROSCOPY Bilateral   . SPINAL CORD STIMULATOR INSERTION N/A 09/23/2015   Procedure: LUMBAR SPINAL CORD STIMULATOR INSERTION;  Surgeon: [name], MD;  Location: [facility];  Service: Neurosurgery;  Laterality: N/A;  LUMBAR SPINAL CORD  STIMULATOR INSERTION  . TOTAL KNEE ARTHROPLASTY Left 06/17/2017  . TOTAL KNEE ARTHROPLASTY Left 06/17/2017   Procedure: TOTAL KNEE ARTHROPLASTY;  Surgeon: [name], MD;  Location: [facility];  Service: Orthopedics;  Laterality: Left;     reports that he has quit smoking. His smoking use included Cigarettes. He has a 30.00 pack-year smoking history. He has quit using smokeless tobacco. His smokeless tobacco use included Chew. He reports that he drinks about 12.6 oz of alcohol per week . He reports that he does not use drugs.  Allergies  Allergen Reactions  . Morphine And Related Nausea And Vomiting  . Propofol Nausea And Vomiting    No family history on file.  Prior to Admission medications   Medication Sig Start Date End Date Taking? Authorizing Provider  alfuzosin (UROXATRAL) 10 MG 24 hr tablet Take 10 mg by mouth daily.   Yes [provider]  ALPRAZolam Duanne Moron) 1 MG tablet Take 1 mg by mouth at bedtime.    Yes [provider]  aspirin EC 325 MG EC tablet Take 1 tablet (325 mg total) by mouth 2 (two) times daily. 06/18/17  Yes Donia Ast, PA  B Complex-C (SUPER B COMPLEX PO) Take 1 tablet by mouth daily.   Yes [provider]  Cholecalciferol (VITAMIN D3) 2000 units capsule Take 2,000 Units by mouth daily.   Yes [provider]  docusate sodium (COLACE) 100 MG capsule Take 200 mg by mouth 2 (two) times daily.    Yes [provider]  esomeprazole (NEXIUM) 40 MG capsule Take 40 mg by mouth daily.    Yes [provider]  gabapentin (NEURONTIN) 400 MG capsule Take 400 mg by mouth 2 (two) times daily.    Yes [provider]  Glucosamine HCl 1500 MG TABS Take 1,500 mg by mouth daily.   Yes [provider]  HYDROcodone-acetaminophen (NORCO) 10-325 MG tablet Take 1 tablet by mouth 3 (three) times daily as needed for moderate pain.   Yes [provider]  Liniments (SALONPAS PAIN RELIEF PATCH EX) Apply 2 patches  topically daily as needed (back pain).    Yes [provider]  methocarbamol (ROBAXIN) 500 MG tablet Take 1-2 tablets (500-1,000 mg total) by mouth every 6 (six) hours as needed for muscle spasms. 06/18/17  Yes Donia Ast, [place]  Omega-3 Fatty Acids (FISH OIL ULTRA) 1400 MG CAPS Take 1,400 mg by mouth daily.   Yes [provider]  oxyCODONE 10 MG TABS Take 0.5-1 tablets (5-10 mg total) by mouth every 3 (three) hours as needed for breakthrough pain. 06/18/17  Yes Donia Ast, PA  tamsulosin (FLOMAX) 0.4 MG CAPS capsule Take 0.4 mg by mouth  daily.   Yes [provider]    Physical Exam: Vitals:   07/23/17 0000 07/23/17 0030 07/23/17 0100 07/23/17 0130  BP: 116/80 111/84 111/81 118/72  Pulse:  (!) 115 (!) 105   Resp:  (!) 24 (!) 23   Temp:      SpO2: 91% 93% 92% 95%  Weight:      Height:        Constitutional: NAD, calm, comfortable Vitals:   07/23/17 0000 07/23/17 0030 07/23/17 0100 07/23/17 0130  BP: 116/80 111/84 111/81 118/72  Pulse:  (!) 115 (!) 105   Resp:  (!) 24 (!) 23   Temp:      SpO2: 91% 93% 92% 95%  Weight:      Height:       Eyes: PERRL, lids and conjunctivae normal ENMT: Mucous membranes are moist. Posterior pharynx clear of any exudate or lesions.Normal dentition.  Neck: normal, supple, no masses, no thyromegaly Respiratory: clear to auscultation bilaterally, no wheezing, no crackles. Normal respiratory effort. No accessory muscle use.  Cardiovascular: Regular rate and rhythm, no murmurs / rubs / gallops. No extremity edema. 2+ pedal pulses. No carotid bruits.  Abdomen: no tenderness, no masses palpated. No hepatosplenomegaly. Bowel sounds positive.  Musculoskeletal: no clubbing / cyanosis. No joint deformity upper and lower extremities. Good ROM, no contractures. Normal muscle tone.  Skin: no rashes, lesions, ulcers. No induration.  Left knee incision c/d/i well healing some knee swelling Neurologic: CN 2-12 grossly intact.  Sensation intact, DTR normal. Strength 5/5 in all 4.  Psychiatric: Normal judgment and insight. Alert and oriented x 3. Normal mood.    Labs on Admission: I have personally reviewed following labs and imaging studies  CBC:  Recent Labs Lab 07/22/17 2334  WBC 7.2  NEUTROABS 3.9  HGB 12.9*  HCT 38.3*  MCV 91.2  PLT 226   Basic Metabolic Panel:  Recent Labs Lab 07/22/17 2334  NA 142  K 3.4*  CL 108  CO2 24  GLUCOSE 142*  BUN 11  CREATININE 0.84  CALCIUM 9.7   GFR: Estimated Creatinine Clearance: 76.9 mL/min (by C-G formula based on SCr of 0.84 mg/dL).  Coagulation Profile:  Recent Labs Lab 07/22/17 2334  INR 1.02     Radiological Exams on Admission: Dg Chest Port 1 View  Result Date: 07/23/2017 CLINICAL DATA:  Chest palpitations, former smoker. EXAM: PORTABLE CHEST 1 VIEW COMPARISON:  None. FINDINGS: Heart is top-normal in size. No aortic aneurysm. Neural stimulator device projects up to the T8 level. The patient is status post ACDF of the cervical spine with partial inclusion of cervical hardware. There is minimal atelectasis at the left lung base. No pneumonic consolidation, effusion or pneumothorax. Osteoarthritis of the right AC joint. IMPRESSION: No active disease. Electronically Signed   By: Ashley Royalty M.D.   On: 07/23/2017 00:03    EKG: Independently reviewed.  afib w rvr no acute st/t changes Old chart reviewed Case discussed with dr Betsey Holiday Case discussed with ICU RN  Assessment/Plan 71 yo male with new onset afib with rvr and recent knee surgery Principal Problem:   Atrial fibrillation with RVR ([facility])- orally load with cardizem 60mg  po now, cont dilt drip with parameters to wean.  Ck tsh level.  Obtain cardiac echo in the am.  Serial trop.  Due to his recent h/o surgery and mild hypoxia will do cta pe protocol.  Cont hep drip for now also.  Active Problems:   S/P total knee replacement- pt is  suppose to be taking aspirin full dose bid but only taking  in once a day for dvt proph post op   History of gastric ulcer- noted   Chronic back pain- cont home meds   BPH (benign prostatic hyperplasia)- noted   Home med list pending   DVT prophylaxis:  Hep drip Code Status:  full Family Communication:  none Disposition Plan:  Per day team Consults called:  none Admission status:  observation   Allis Quirarte A MD [facility]  If 7PM-7AM, please contact night-coverage [email or web address] Password [email or web address]  07/23/2017, 2:28 AM

## 2017-07-23 NOTE — Care Management Note (Signed)
Case Management Note  Patient Details  Name: PAULANTHONY GLEAVES MRN: [id number] Date of Birth: 09/22/1946  Subjective/Objective:    Adm from home afib with RVR. Recent TKR. Ind PTA. Walks with cane, recently finished home health with [facility] and is now doing OP PT.                 Action/Plan:Anticipate DC home with self care. Will follow for needs.    Expected Discharge Date:  07/26/17               Expected Discharge Plan:     In-House Referral:     Discharge planning Services  CM Consult  Post Acute Care Choice:    Choice offered to:     DME Arranged:    DME Agency:     HH Arranged:    HH Agency:     Status of Service:  In process, will continue to follow  If discussed at Long Length of Stay Meetings, dates discussed:    Additional Comments:  Lacresia Darwish, Chauncey Reading, RN 07/23/2017, 3:03 PM

## 2017-07-23 NOTE — Consult Note (Signed)
Cardiology Consultation:   Patient ID: Christopher [name]; [id number]; 1946-11-28   Admit date: 07/22/2017 Date of Consult: 07/23/2017  Primary Care Provider: [name], MD Primary Cardiologist: Dr. Gwenlyn Found in the past (2006).  Primary Electrophysiologist:  NA   Patient Profile:   Christopher [name] is a 71 y.o. male with a hx of recent total left knee replacement at [facility] on 07/01/2016 due to left knee quadricepts tendon tear,hx of gastric ulcer, GERD, DDD BPH,  who is being seen today for the evaluation of new onset atrial fib at the request of Dr, Cindie Laroche.   History of Present Illness:   [name] presented to the ER for evaluation of palpitations, which occurred suddenly and lasted over 3 hours. He states this occurred once approximately one month ago, but resolved on its own. Recovering from repeat knee surgery On admission to ER in rapid atrial fibrillation No chest pain or CHF. No contraindications to DOAC. Rx with heparin and iv cardizem. This am no complaints rate controlled  R/O D dimer elevated has CTA pending CXR on admission NAD   Past Medical History:  Diagnosis Date  . Arthritis    "all over my body" (06/17/2017)  . BPH (benign prostatic hyperplasia)    takes Proscar daily  . Chronic back pain    DDD; "upper and lower" (06/17/2017)  . Constipation    takes Colace daily  . DDD (degenerative disc disease), cervical   . Enlarged prostate   . GERD (gastroesophageal reflux disease)    takes Nexium daily  . H/O hiatal hernia   . History of colon polyps    benign  . History of gastric ulcer   . Insomnia    takes Xanax nightly  . Joint capsule tear    left knee  . Joint pain   . Joint swelling   . Macular degeneration   . Muscle spasm of back    takes Valium daily as needed  . Peripheral neuropathy    takes Gabapentin daily  . PONV (postoperative nausea and vomiting)   . Urinary hesitancy   . Weakness    numbness and tingling    Past Surgical History:  Procedure  Laterality Date  . ANTERIOR CERVICAL DECOMP/DISCECTOMY FUSION  ~ 1991   "took bone from hip"  . ANTERIOR CERVICAL DECOMP/DISCECTOMY FUSION  X 2   "put plate in"  . ANTERIOR CERVICAL DISCECTOMY  <11/2005    5-6 and 4-5./notes 05/02/2011  . APPENDECTOMY    . BACK SURGERY    . CARDIAC CATHETERIZATION    . CARPAL TUNNEL RELEASE Bilateral 10/2005 - 11/2005   right-left Archie Endo 05/02/2011  . CATARACT EXTRACTION W/PHACO Right 05/24/2014   Procedure: CATARACT EXTRACTION PHACO AND INTRAOCULAR LENS PLACEMENT (IOC);  Surgeon: [name], MD;  Location: [facility];  Service: Ophthalmology;  Laterality: Right;  CDE 6.74  . CATARACT EXTRACTION W/PHACO Left 06/17/2014   Procedure: CATARACT EXTRACTION PHACO AND INTRAOCULAR LENS PLACEMENT (IOC);  Surgeon: [name], MD;  Location: [facility];  Service: Ophthalmology;  Laterality: Left;  CDE:4.65  . COLONOSCOPY WITH ESOPHAGOGASTRODUODENOSCOPY (EGD) N/A 01/22/2013   Procedure: COLONOSCOPY WITH ESOPHAGOGASTRODUODENOSCOPY (EGD);  Surgeon: [name], MD;  Location: [facility];  Service: Endoscopy;  Laterality: N/A;  140  . EYE SURGERY Bilateral    "cleaned film w/laser; since my 1st cataract OR"  . JOINT REPLACEMENT    . POSTERIOR LUMBAR FUSION     "put a plate in"  . QUADRICEPS TENDON REPAIR Left 07/01/2017  .  QUADRICEPS TENDON REPAIR Left 07/01/2017   Procedure: REPAIR QUADRICEP TENDON;  Surgeon: [name], MD;  Location: [place];  Service: Orthopedics;  Laterality: Left;  . SHOULDER ARTHROSCOPY Bilateral   . SPINAL CORD STIMULATOR INSERTION N/A 09/23/2015   Procedure: LUMBAR SPINAL CORD STIMULATOR INSERTION;  Surgeon: [name], MD;  Location: [place] [facility];  Service: Neurosurgery;  Laterality: N/A;  LUMBAR SPINAL CORD STIMULATOR INSERTION  . TOTAL KNEE ARTHROPLASTY Left 06/17/2017  . TOTAL KNEE ARTHROPLASTY Left 06/17/2017   Procedure: TOTAL KNEE ARTHROPLASTY;  Surgeon: [name], MD;  Location: [place];  Service: Orthopedics;  Laterality: Left;      Inpatient Medications: Scheduled Meds: . aspirin EC  81 mg Oral Daily  . diltiazem  60 mg Oral Q8H  . sodium chloride flush  3 mL Intravenous Q12H   Continuous Infusions: . sodium chloride    . heparin 1,100 Units/hr (07/23/17 0311)   PRN Meds: sodium chloride, iopamidol, sodium chloride flush  Allergies:    Allergies  Allergen Reactions  . Morphine And Related Nausea And Vomiting  . Propofol Nausea And Vomiting    Social History:   Social History   Social History  . Marital status: Significant Other    Spouse name: N/A  . Number of children: N/A  . Years of education: N/A   Occupational History  . Not on file.   Social History Main Topics  . Smoking status: Former Smoker    Packs/day: 1.00    Years: 30.00    Types: Cigarettes  . Smokeless tobacco: Former Systems developer    Types: Chew     Comment: quit smoking  & chewing by [place]  . Alcohol use 12.6 oz/week    9 Glasses of wine, 12 Cans of beer per week     Comment:  "none since surgery " 06/28/17  . Drug use: No  . Sexual activity: Not Currently   Other Topics Concern  . Not on file   Social History Narrative  . No narrative on file    Family History:   History reviewed. No pertinent family history.   ROS:  Please see the history of present illness.  ROS  All other ROS reviewed and negative.     Physical Exam/Data:   Vitals:   07/23/17 0615 07/23/17 0630 07/23/17 0645 07/23/17 0700  BP: (!) 95/56 (!) 85/38 (!) 83/62 (!) 78/48  Pulse: 73 66 (!) 44 61  Resp: (!) 21 17 19 14   Temp:      TempSrc:      SpO2: 94% 98% 96% 94%  Weight:      Height:        Intake/Output Summary (Last 24 hours) at 07/23/17 0841 Last data filed at 07/23/17 0700  Gross per 24 hour  Intake           148.41 ml  Output                0 ml  Net           148.41 ml   Filed Weights   07/22/17 2322 07/23/17 0259 07/23/17 0500  Weight: 162 lb (73.5 kg) 158 lb 15.2 oz (72.1 kg) 158 lb 15.2 oz (72.1 kg)   Body mass index is  24.17 kg/m.  General:  Well nourished, well developed, in no acute distress  HEENT: normal Lymph: no adenopathy Neck: no JVD Endocrine:  No thryomegaly Vascular: No carotid bruits; FA pulses 2+ bilaterally without bruits  Cardiac:  normal S1,  S2; RRR; no murmur   Lungs:  clear to auscultation bilaterally, no wheezing, rhonchi or rales  Abd: soft, nontender, no hepatomegaly  Ext: no edema Musculoskeletal:  Post left TKR  Skin: warm and dry  Neuro:  CNs 2-12 intact, no focal abnormalities noted Psych:  Normal affect   EKG:  The EKG was personally reviewed and demonstrates:  Rapid afib with no acute ST changes  Telemetry:  Telemetry was personally reviewed and demonstrates:  Afib rates 80-100  Relevant CV Studies: None   Laboratory Data:  Chemistry  Recent Labs Lab 07/22/17 2334 07/23/17 0300  NA 142 143  K 3.4* 3.9  CL 108 106  CO2 24 28  GLUCOSE 142* 150*  BUN 11 11  CREATININE 0.84 0.89  CALCIUM 9.7 9.3  GFRNONAA >60 >60  GFRAA >60 >60  ANIONGAP 10 9    No results for input(s): PROT, ALBUMIN, AST, ALT, ALKPHOS, BILITOT in the last 168 hours. Hematology  Recent Labs Lab 07/22/17 2334 07/23/17 0300  WBC 7.2 7.5  RBC 4.20* 4.15*  HGB 12.9* 12.8*  HCT 38.3* 38.2*  MCV 91.2 92.0  MCH 30.7 30.8  MCHC 33.7 33.5  RDW 12.3 12.6  PLT 235 229   Cardiac Enzymes  Recent Labs Lab 07/23/17 0300  TROPONINI <0.03     Recent Labs Lab 07/22/17 2339  TROPIPOC 0.00    BNPNo results for input(s): BNP, PROBNP in the last 168 hours.  DDimer   Recent Labs Lab 07/22/17 2334  DDIMER 3.31*    Radiology/Studies:  Dg Chest Port 1 View  Result Date: 07/23/2017 CLINICAL DATA:  Chest palpitations, former smoker. EXAM: PORTABLE CHEST 1 VIEW COMPARISON:  None. FINDINGS: Heart is top-normal in size. No aortic aneurysm. Neural stimulator device projects up to the T8 level. The patient is status post ACDF of the cervical spine with partial inclusion of cervical hardware.  There is minimal atelectasis at the left lung base. No pneumonic consolidation, effusion or pneumothorax. Osteoarthritis of the right AC joint. IMPRESSION: No active disease. Electronically Signed   By: Ashley Royalty M.D.   On: 07/23/2017 00:03    Assessment and Plan:   1. PAF:  D/c heparin and start DOAC continue oral cardizem for rate control Echo pending Outpatient f/u and [place] in 4 weeks if no spontaneous conversion 2. D-dimer: elevated post TKR x 2 primary service has ordered LE [place] and CTA on heparin 3. Ortho:  Will need more PT/OT set back after fall and need for revision surgery    Jenkins Rouge, MD

## 2017-07-23 NOTE — Progress Notes (Signed)
ANTICOAGULATION CONSULT NOTE - Follow Up Consult  Pharmacy Consult for Heparin Indication: atrial fibrillation  Allergies  Allergen Reactions  . Morphine And Related Nausea And Vomiting  . Propofol Nausea And Vomiting    Patient Measurements: Height: 5\' 8"  (172.7 cm) Weight: 158 lb 15.2 oz (72.1 kg) IBW/kg (Calculated) : 68.4 HEPARIN DW (KG): 72.1   Vital Signs: Temp: 98 F (36.7 C) (08/07 0800) Temp Source: Oral (08/07 0800) BP: 78/48 (08/07 0700) Pulse Rate: 61 (08/07 0700)  Labs:  Recent Labs  07/22/17 2334 07/23/17 0300 07/23/17 0756  HGB 12.9* 12.8*  --   HCT 38.3* 38.2*  --   PLT 235 229  --   APTT 30  --   --   LABPROT 13.4  --   --   INR 1.02  --   --   HEPARINUNFRC  --   --  0.32  CREATININE 0.84 0.89  --   TROPONINI  --  <0.03 <0.03    Estimated Creatinine Clearance: 72.6 mL/min (by C-G formula based on SCr of 0.89 mg/dL).  Medications:  Prescriptions Prior to Admission  Medication Sig Dispense Refill Last Dose  . alfuzosin (UROXATRAL) 10 MG 24 hr tablet Take 10 mg by mouth daily.   07/22/2017 at Unknown time  . ALPRAZolam (XANAX) 1 MG tablet Take 1 mg by mouth at bedtime.    07/21/2017 at Unknown time  . aspirin EC 325 MG EC tablet Take 1 tablet (325 mg total) by mouth 2 (two) times daily. (Patient taking differently: Take 325 mg by mouth daily. ) 30 tablet 0 07/22/2017 at Unknown time  . B Complex-C (SUPER B COMPLEX PO) Take 1 tablet by mouth daily.   07/22/2017 at Unknown time  . Cholecalciferol (VITAMIN D3) 2000 units capsule Take 2,000 Units by mouth daily.   07/22/2017 at Unknown time  . docusate sodium (COLACE) 100 MG capsule Take 200 mg by mouth 2 (two) times daily.    07/22/2017 at Unknown time  . esomeprazole (NEXIUM) 40 MG capsule Take 40 mg by mouth daily.    07/22/2017 at Unknown time  . gabapentin (NEURONTIN) 400 MG capsule Take 400 mg by mouth 2 (two) times daily.    07/22/2017 at Unknown time  . Glucosamine HCl 1500 MG TABS Take 1,500 mg by mouth  daily.   07/22/2017 at Unknown time  . HYDROcodone-acetaminophen (NORCO) 10-325 MG tablet Take 1 tablet by mouth 3 (three) times daily as needed for moderate pain.   07/22/2017 at Unknown time  . Liniments (SALONPAS PAIN RELIEF PATCH EX) Apply 2 patches topically daily as needed (back pain).    unknown  . methocarbamol (ROBAXIN) 500 MG tablet Take 1-2 tablets (500-1,000 mg total) by mouth every 6 (six) hours as needed for muscle spasms. 60 tablet 0 07/22/2017 at Unknown time  . Omega-3 Fatty Acids (FISH OIL ULTRA) 1400 MG CAPS Take 1,400 mg by mouth daily.   07/22/2017 at Unknown time  . tamsulosin (FLOMAX) 0.4 MG CAPS capsule Take 0.4 mg by mouth daily.   07/22/2017 at Unknown time  . meloxicam (MOBIC) 15 MG tablet Take 15 mg by mouth daily.  2     Assessment: Okay for Protocol, heparin level @ goal.  Tx to [place] soon be cards note.  Goal of Therapy:  Heparin level 0.3-0.7 units/ml Monitor platelets by anticoagulation protocol: Yes   Plan:  Continue heparin @ 1100 units/hr Daily Hep Level and CBC. Monitor for signs and symptoms of bleeding.   Amedeo Plenty,  Otelia Limes 07/23/2017,9:58 AM

## 2017-07-24 ENCOUNTER — Ambulatory Visit (HOSPITAL_COMMUNITY): Payer: Medicare Other

## 2017-07-24 DIAGNOSIS — I4891 Unspecified atrial fibrillation: Secondary | ICD-10-CM | POA: Diagnosis not present

## 2017-07-24 LAB — HEPARIN LEVEL (UNFRACTIONATED): Heparin Unfractionated: 0.44 IU/mL (ref 0.30–0.70)

## 2017-07-24 LAB — CBC
HCT: 37.6 % — ABNORMAL LOW (ref 39.0–52.0)
Hemoglobin: 12.6 g/dL — ABNORMAL LOW (ref 13.0–17.0)
MCH: 30.8 pg (ref 26.0–34.0)
MCHC: 33.5 g/dL (ref 30.0–36.0)
MCV: 91.9 fL (ref 78.0–100.0)
Platelets: 240 10*3/uL (ref 150–400)
RBC: 4.09 MIL/uL — ABNORMAL LOW (ref 4.22–5.81)
RDW: 12.7 % (ref 11.5–15.5)
WBC: 5.9 10*3/uL (ref 4.0–10.5)

## 2017-07-24 MED ORDER — DILTIAZEM HCL 60 MG PO TABS
60.0000 mg | ORAL_TABLET | Freq: Two times a day (BID) | ORAL | Status: DC
  Administered 2017-07-24 – 2017-07-25 (×2): 60 mg via ORAL
  Filled 2017-07-24 (×3): qty 1

## 2017-07-24 MED ORDER — APIXABAN 5 MG PO TABS
5.0000 mg | ORAL_TABLET | Freq: Two times a day (BID) | ORAL | Status: DC
  Administered 2017-07-24 – 2017-07-25 (×3): 5 mg via ORAL
  Filled 2017-07-24 (×3): qty 1

## 2017-07-24 NOTE — Progress Notes (Signed)
Progress Note  Patient Name: Christopher [name] Date of Encounter: 07/24/2017   Subjective   No complaints  Inpatient Medications    Scheduled Meds: . alfuzosin  10 mg Oral Q breakfast  . ALPRAZolam  1 mg Oral QHS  . aspirin EC  81 mg Oral Daily  . B-complex with vitamin C  1 tablet Oral Daily  . cholecalciferol  2,000 Units Oral Daily  . diltiazem  60 mg Oral Q8H  . docusate sodium  200 mg Oral BID  . gabapentin  400 mg Oral BID  . meloxicam  15 mg Oral Daily  . pantoprazole  80 mg Oral Q1200  . sodium chloride flush  3 mL Intravenous Q12H  . tamsulosin  0.4 mg Oral Daily   Continuous Infusions: . sodium chloride    . heparin 1,100 Units/hr (07/23/17 2321)   PRN Meds: sodium chloride, HYDROcodone-acetaminophen, methocarbamol, sodium chloride flush   Vital Signs    Vitals:   07/24/17 0400 07/24/17 0500 07/24/17 0600 07/24/17 0748  BP: (!) 93/53 107/62 (!) 101/58   Pulse: 61 66 62 61  Resp: 16 17 18 16   Temp: 98 F (36.7 C)   98.3 F (36.8 C)  TempSrc: Oral   Oral  SpO2: 95% 94% 96% 94%  Weight:  160 lb 11.5 oz (72.9 kg)    Height:        Intake/Output Summary (Last 24 hours) at 07/24/17 0839 Last data filed at 07/24/17 0500  Gross per 24 hour  Intake            149.5 ml  Output              825 ml  Net           -675.5 ml   Filed Weights   07/23/17 0259 07/23/17 0500 07/24/17 0500  Weight: 158 lb 15.2 oz (72.1 kg) 158 lb 15.2 oz (72.1 kg) 160 lb 11.5 oz (72.9 kg)    Telemetry    NSR ECG     Physical Exam   GEN: No acute distress.   Neck: No JVD Cardiac: RRR, no murmurs, rubs, or gallops.  Respiratory: Clear to auscultation bilaterally. GI: Soft, nontender, non-distended  MS: No edema; No deformity. Neuro:  Nonfocal  Psych: Normal affect   Labs    Chemistry Recent Labs Lab 07/22/17 2334 07/23/17 0300  NA 142 143  K 3.4* 3.9  CL 108 106  CO2 24 28  GLUCOSE 142* 150*  BUN 11 11  CREATININE 0.84 0.89  CALCIUM 9.7 9.3  GFRNONAA >60  >60  GFRAA >60 >60  ANIONGAP 10 9     Hematology Recent Labs Lab 07/22/17 2334 07/23/17 0300 07/24/17 0428  WBC 7.2 7.5 5.9  RBC 4.20* 4.15* 4.09*  HGB 12.9* 12.8* 12.6*  HCT 38.3* 38.2* 37.6*  MCV 91.2 92.0 91.9  MCH 30.7 30.8 30.8  MCHC 33.7 33.5 33.5  RDW 12.3 12.6 12.7  PLT 235 229 240    Cardiac Enzymes Recent Labs Lab 07/23/17 0300 07/23/17 0756 07/23/17 1403  TROPONINI <0.03 <0.03 <0.03    Recent Labs Lab 07/22/17 2339  TROPIPOC 0.00     BNPNo results for input(s): BNP, PROBNP in the last 168 hours.   DDimer  Recent Labs Lab 07/22/17 2334  DDIMER 3.31*     Radiology    Ct Angio Chest Pe W Or Wo Contrast  Result Date: 07/23/2017 CLINICAL DATA:  Shortness of breath. EXAM: CT ANGIOGRAPHY CHEST WITH CONTRAST  TECHNIQUE: Multidetector CT imaging of the chest was performed using the standard protocol during bolus administration of intravenous contrast. Multiplanar CT image reconstructions and MIPs were obtained to evaluate the vascular anatomy. CONTRAST:  100 mL of Isovue 370 intravenously. COMPARISON:  CT scan of March 07, 2016. FINDINGS: Cardiovascular: Satisfactory opacification of the pulmonary arteries to the segmental level. No evidence of pulmonary embolism. Normal heart size. No pericardial effusion. Coronary calcifications are noted. Mediastinum/Nodes: No enlarged mediastinal, hilar, or axillary lymph nodes. Thyroid gland, trachea, and esophagus demonstrate no significant findings. Lungs/Pleura: Mild right apical scarring is noted. No pneumothorax or pleural effusion is noted. Stable 4 mm nodule is noted in left lung base, which can be considered benign at this point with no further follow-up required. Upper Abdomen: No acute abnormality. Musculoskeletal: No chest wall abnormality. No acute or significant osseous findings. Review of the MIP images confirms the above findings. IMPRESSION: No evidence of pulmonary embolus. Coronary artery calcifications are  noted suggesting coronary artery disease. Aortic Atherosclerosis (ICD10-I70.0). Electronically Signed   By: Marijo Conception, M.D.   On: 07/23/2017 11:54   US Venous Img Lower Unilateral Left  Result Date: 07/23/2017 CLINICAL DATA:  Left lower extremity swelling. EXAM: LEFT LOWER EXTREMITY VENOUS DOPPLER ULTRASOUND TECHNIQUE: Gray-scale sonography with graded compression, as well as color Doppler and duplex ultrasound were performed to evaluate the lower extremity deep venous systems from the level of the common femoral vein and including the common femoral, femoral, profunda femoral, popliteal and calf veins including the posterior tibial, peroneal and gastrocnemius veins when visible. The superficial great saphenous vein was also interrogated. Spectral Doppler was utilized to evaluate flow at rest and with distal augmentation maneuvers in the common femoral, femoral and popliteal veins. COMPARISON:  None. FINDINGS: Contralateral Common Femoral Vein: Respiratory phasicity is normal and symmetric with the symptomatic side. No evidence of thrombus. Normal compressibility. Common Femoral Vein: No evidence of thrombus. Normal compressibility, respiratory phasicity and response to augmentation. Saphenofemoral Junction: No evidence of thrombus. Normal compressibility and flow on color Doppler imaging. Profunda Femoral Vein: No evidence of thrombus. Normal compressibility and flow on color Doppler imaging. Femoral Vein: No evidence of thrombus. Normal compressibility, respiratory phasicity and response to augmentation. Popliteal Vein: No evidence of thrombus. Normal compressibility, respiratory phasicity and response to augmentation. Calf Veins: Occlusive thrombus within the peroneal vein. The posterior and anterior tibial veins demonstrate normal compressibility and flow on color Doppler imaging. Superficial Great Saphenous Vein: No evidence of thrombus. Normal compressibility and flow on color Doppler imaging. Venous  Reflux:  None. Other Findings:  None. 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 These results will be called to the ordering clinician or representative by the Radiologist Assistant, and communication documented in the PACS or zVision Dashboard. Electronically Signed   By: Titus Dubin M.D.   On: 07/23/2017 15:31   Dg Chest Port 1 View  Result Date: 07/23/2017 CLINICAL DATA:  Chest palpitations, former smoker. EXAM: PORTABLE CHEST 1 VIEW COMPARISON:  None. FINDINGS: Heart is top-normal in size. No aortic aneurysm. Neural stimulator device projects up to the T8 level. The patient is status post ACDF of the cervical spine with partial inclusion of cervical hardware. There is minimal atelectasis at the left lung base. No pneumonic consolidation, effusion or pneumothorax. Osteoarthritis of the right AC joint. IMPRESSION: No active disease. Electronically Signed   By: Ashley Royalty M.D.   On: 07/23/2017 00:03    Cardiac Studies    Patient Profile  CAIDEN ARTEAGA is a 71 y.o. male with a hx of recent total left knee replacement at [facility] on 07/01/2016 due to left knee quadricepts tendon tear,hx of gastric ulcer, GERD, DDD BPH,  who is being seen today for the evaluation of new onset atrial fib at the request of Dr, Cindie Laroche.    Assessment & Plan    1. PAF - patient is on dilt for rate control, oral 60mg  every 8 hrs. Soft bp's at times, rates are  - on heparin gtt for stroke prevention. CHADS2 Vasc score is 1. Will need anticoag due to recent DVT, can consider after complete of therapy whether to continue for afib or change to aspirin - echo LVEF 55-60%, mild LAE. Normal TSH  2. DVT - noted on [place], he will require anticoag. Appears to be provoked in setting of recent knee replacement - patient worried about cost of eliquis or xarelto, we will place a care management consult to help evaluate his options. May have to go on coumadin if too expensive   No further cardiology recs at  this time. Patient will need appropirate oral anticoagulation arranged prior to discharge, hopefully can afford DOAC, if not will need to consult pharmacy to start coumadin.  Merrily Pew, MD  07/24/2017, 8:39 AM

## 2017-07-24 NOTE — Progress Notes (Signed)
Logbook with patient and wife concerning need for anticoagulation due to complete occlusion of peroneal vein status post 2 weeks orthopedic procedure in leg discussing merits and disadvantages of all 3 oral anticoagulants with satellite L requested coverage and knees of administration vision and wife agree. Christopher [name] ESP:[id number] DOB: March 11, 1946 DOA: 07/22/2017 PCP: [name], MD   Physical Exam: Blood pressure 115/66, pulse 69, temperature 97.9 F (36.6 C), temperature source Oral, resp. rate (!) 22, height 5\' 8"  (1.727 m), weight 72.9 kg (160 lb 11.5 oz), SpO2 98 %. Lungs clear to A&P no rales wheeze rhonchi heart regular rhythm no murmurs Sees feels rubs abdomen soft nontender bowel sounds normoactive   Investigations:  Recent Results (from the past 240 hour(s))  MRSA PCR Screening     Status: None   Collection Time: 07/23/17  2:28 AM  Result Value Ref Range Status   MRSA by PCR NEGATIVE NEGATIVE Final    Comment:        The GeneXpert MRSA Assay (FDA approved for NASAL specimens only), is one component of a comprehensive MRSA colonization surveillance program. It is not intended to diagnose MRSA infection nor to guide or monitor treatment for MRSA infections.      Basic Metabolic Panel:  Recent Labs  07/22/17 2334 07/23/17 0300  NA 142 143  K 3.4* 3.9  CL 108 106  CO2 24 28  GLUCOSE 142* 150*  BUN 11 11  CREATININE 0.84 0.89  CALCIUM 9.7 9.3   Liver Function Tests: No results for input(s): AST, ALT, ALKPHOS, BILITOT, PROT, ALBUMIN in the last 72 hours.   CBC:  Recent Labs  07/22/17 2334 07/23/17 0300 07/24/17 0428  WBC 7.2 7.5 5.9  NEUTROABS 3.9  --   --   HGB 12.9* 12.8* 12.6*  HCT 38.3* 38.2* 37.6*  MCV 91.2 92.0 91.9  PLT 235 229 240    Ct Angio Chest Pe W Or Wo Contrast  Result Date: 07/23/2017 CLINICAL DATA:  Shortness of breath. EXAM: CT ANGIOGRAPHY CHEST WITH CONTRAST TECHNIQUE: Multidetector CT imaging of the chest was performed  using the standard protocol during bolus administration of intravenous contrast. Multiplanar CT image reconstructions and MIPs were obtained to evaluate the vascular anatomy. CONTRAST:  100 mL of Isovue 370 intravenously. COMPARISON:  CT scan of March 07, 2016. FINDINGS: Cardiovascular: Satisfactory opacification of the pulmonary arteries to the segmental level. No evidence of pulmonary embolism. Normal heart size. No pericardial effusion. Coronary calcifications are noted. Mediastinum/Nodes: No enlarged mediastinal, hilar, or axillary lymph nodes. Thyroid gland, trachea, and esophagus demonstrate no significant findings. Lungs/Pleura: Mild right apical scarring is noted. No pneumothorax or pleural effusion is noted. Stable 4 mm nodule is noted in left lung base, which can be considered benign at this point with no further follow-up required. Upper Abdomen: No acute abnormality. Musculoskeletal: No chest wall abnormality. No acute or significant osseous findings. Review of the MIP images confirms the above findings. IMPRESSION: No evidence of pulmonary embolus. Coronary artery calcifications are noted suggesting coronary artery disease. Aortic Atherosclerosis (ICD10-I70.0). Electronically Signed   By: Marijo Conception, M.D.   On: 07/23/2017 11:54   US Venous Img Lower Unilateral Left  Result Date: 07/23/2017 CLINICAL DATA:  Left lower extremity swelling. EXAM: LEFT LOWER EXTREMITY VENOUS DOPPLER ULTRASOUND TECHNIQUE: Gray-scale sonography with graded compression, as well as color Doppler and duplex ultrasound were performed to evaluate the lower extremity deep venous systems from the level of the common femoral vein and including the common  femoral, femoral, profunda femoral, popliteal and calf veins including the posterior tibial, peroneal and gastrocnemius veins when visible. The superficial great saphenous vein was also interrogated. Spectral Doppler was utilized to evaluate flow at rest and with distal  augmentation maneuvers in the common femoral, femoral and popliteal veins. COMPARISON:  None. FINDINGS: Contralateral Common Femoral Vein: Respiratory phasicity is normal and symmetric with the symptomatic side. No evidence of thrombus. Normal compressibility. Common Femoral Vein: No evidence of thrombus. Normal compressibility, respiratory phasicity and response to augmentation. Saphenofemoral Junction: No evidence of thrombus. Normal compressibility and flow on color Doppler imaging. Profunda Femoral Vein: No evidence of thrombus. Normal compressibility and flow on color Doppler imaging. Femoral Vein: No evidence of thrombus. Normal compressibility, respiratory phasicity and response to augmentation. Popliteal Vein: No evidence of thrombus. Normal compressibility, respiratory phasicity and response to augmentation. Calf Veins: Occlusive thrombus within the peroneal vein. The posterior and anterior tibial veins demonstrate normal compressibility and flow on color Doppler imaging. Superficial Great Saphenous Vein: No evidence of thrombus. Normal compressibility and flow on color Doppler imaging. Venous Reflux:  None. Other Findings:  None. 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 These results will be called to the ordering clinician or representative by the Radiologist Assistant, and communication documented in the PACS or zVision Dashboard. Electronically Signed   By: Titus Dubin M.D.   On: 07/23/2017 15:31   Dg Chest Port 1 View  Result Date: 07/23/2017 CLINICAL DATA:  Chest palpitations, former smoker. EXAM: PORTABLE CHEST 1 VIEW COMPARISON:  None. FINDINGS: Heart is top-normal in size. No aortic aneurysm. Neural stimulator device projects up to the T8 level. The patient is status post ACDF of the cervical spine with partial inclusion of cervical hardware. There is minimal atelectasis at the left lung base. No pneumonic consolidation, effusion or pneumothorax. Osteoarthritis of the  right AC joint. IMPRESSION: No active disease. Electronically Signed   By: Ashley Royalty M.D.   On: 07/23/2017 00:03      Medications:  Impression:  Principal Problem:   Atrial fibrillation with RVR ([facility]) Active Problems:   S/P total knee replacement   History of gastric ulcer   Chronic back pain   BPH (benign prostatic hyperplasia)     Plan: Add L Quist 5 mg by mouth twice a day today we'll discontinue heparin several hours from now   Consultants: Cardiology   Procedure   Antibiotics:            Time spent:   LOS: 0 days   Tinamarie Przybylski M   07/24/2017, 12:59 PM

## 2017-07-24 NOTE — Progress Notes (Signed)
ANTICOAGULATION CONSULT NOTE - Follow Up Consult  Pharmacy Consult for Heparin Indication: atrial fibrillation  Allergies  Allergen Reactions  . Morphine And Related Nausea And Vomiting  . Propofol Nausea And Vomiting   Patient Measurements: Height: 5\' 8"  (172.7 cm) Weight: 160 lb 11.5 oz (72.9 kg) IBW/kg (Calculated) : 68.4 HEPARIN DW (KG): 72.1   Vital Signs: Temp: 97.9 F (36.6 C) (08/08 1121) Temp Source: Oral (08/08 1121) BP: 115/66 (08/08 0800) Pulse Rate: 69 (08/08 1121)  Labs:  Recent Labs  07/22/17 2334 07/23/17 0300 07/23/17 0756 07/23/17 1403 07/24/17 0428  HGB 12.9* 12.8*  --   --  12.6*  HCT 38.3* 38.2*  --   --  37.6*  PLT 235 229  --   --  240  APTT 30  --   --   --   --   LABPROT 13.4  --   --   --   --   INR 1.02  --   --   --   --   HEPARINUNFRC  --   --  0.32  --  0.44  CREATININE 0.84 0.89  --   --   --   TROPONINI  --  <0.03 <0.03 <0.03  --    Estimated Creatinine Clearance: 72.6 mL/min (by C-G formula based on SCr of 0.89 mg/dL).  Medications:  Prescriptions Prior to Admission  Medication Sig Dispense Refill Last Dose  . alfuzosin (UROXATRAL) 10 MG 24 hr tablet Take 10 mg by mouth daily.   07/22/2017 at Unknown time  . ALPRAZolam (XANAX) 1 MG tablet Take 1 mg by mouth at bedtime.    07/21/2017 at Unknown time  . aspirin EC 325 MG EC tablet Take 1 tablet (325 mg total) by mouth 2 (two) times daily. (Patient taking differently: Take 325 mg by mouth daily. ) 30 tablet 0 07/22/2017 at Unknown time  . B Complex-C (SUPER B COMPLEX PO) Take 1 tablet by mouth daily.   07/22/2017 at Unknown time  . Cholecalciferol (VITAMIN D3) 2000 units capsule Take 2,000 Units by mouth daily.   07/22/2017 at Unknown time  . docusate sodium (COLACE) 100 MG capsule Take 200 mg by mouth 2 (two) times daily.    07/22/2017 at Unknown time  . esomeprazole (NEXIUM) 40 MG capsule Take 40 mg by mouth daily.    07/22/2017 at Unknown time  . gabapentin (NEURONTIN) 400 MG capsule Take 400  mg by mouth 2 (two) times daily.    07/22/2017 at Unknown time  . Glucosamine HCl 1500 MG TABS Take 1,500 mg by mouth daily.   07/22/2017 at Unknown time  . HYDROcodone-acetaminophen (NORCO) 10-325 MG tablet Take 1 tablet by mouth 3 (three) times daily as needed for moderate pain.   07/22/2017 at Unknown time  . Liniments (SALONPAS PAIN RELIEF PATCH EX) Apply 2 patches topically daily as needed (back pain).    unknown  . meloxicam (MOBIC) 15 MG tablet Take 15 mg by mouth daily.  2 07/22/2017 at Unknown time  . methocarbamol (ROBAXIN) 500 MG tablet Take 1-2 tablets (500-1,000 mg total) by mouth every 6 (six) hours as needed for muscle spasms. 60 tablet 0 07/22/2017 at Unknown time  . Omega-3 Fatty Acids (FISH OIL ULTRA) 1400 MG CAPS Take 1,400 mg by mouth daily.   07/22/2017 at Unknown time  . tamsulosin (FLOMAX) 0.4 MG CAPS capsule Take 0.4 mg by mouth daily.   07/22/2017 at Unknown time   Assessment: Okay for Protocol, heparin level @  goal.  Tx to [place] or warfarin soon per cards.   Goal of Therapy:  Heparin level 0.3-0.7 units/ml Monitor platelets by anticoagulation protocol: Yes   Plan:  Continue heparin @ 1100 units/hr Daily Hep Level and CBC. Monitor for signs and symptoms of bleeding.   [place] Crane, Casmir Auguste A 07/24/2017,11:29 AM

## 2017-07-25 LAB — CBC
HCT: 37.2 % — ABNORMAL LOW (ref 39.0–52.0)
Hemoglobin: 12.4 g/dL — ABNORMAL LOW (ref 13.0–17.0)
MCH: 30.6 pg (ref 26.0–34.0)
MCHC: 33.3 g/dL (ref 30.0–36.0)
MCV: 91.9 fL (ref 78.0–100.0)
Platelets: 247 K/uL (ref 150–400)
RBC: 4.05 MIL/uL — ABNORMAL LOW (ref 4.22–5.81)
RDW: 12.6 % (ref 11.5–15.5)
WBC: 5.5 K/uL (ref 4.0–10.5)

## 2017-07-25 MED ORDER — CHOLECALCIFEROL 50 MCG (2000 UT) PO TABS: 2000.0000 [IU] | ORAL_TABLET | Freq: Every day | ORAL | 11 refills | Status: AC

## 2017-07-25 MED ORDER — APIXABAN 5 MG PO TABS: 10.0000 mg | ORAL_TABLET | Freq: Two times a day (BID) | ORAL | 0 refills | Status: DC

## 2017-07-25 MED ORDER — DILTIAZEM HCL 60 MG PO TABS: 60.0000 mg | ORAL_TABLET | Freq: Two times a day (BID) | ORAL | 11 refills | Status: DC

## 2017-07-25 MED ORDER — APIXABAN 5 MG PO TABS: 10.0000 mg | ORAL_TABLET | Freq: Two times a day (BID) | ORAL | Status: DC

## 2017-07-25 MED ORDER — PANTOPRAZOLE SODIUM 40 MG PO TBEC: 80.0000 mg | DELAYED_RELEASE_TABLET | Freq: Every day | ORAL | 4 refills | Status: DC

## 2017-07-25 NOTE — Care Management (Addendum)
Benefits check for "Needs prescription for eliquis 10mg  bid for 7 days then 5mg  bid for DVT, please help with evaluating cost for medicine at home" is as follows:   Pt copay will be $45- eliquis comes in 5mg  tablet, so he would have to take 5mg  qid . Prior Josem Kaufmann is required because it is over the quanitiy limit. PA # [id number]   MD notified of need for prior authorization. CM will give Eliquis voucher to patient.   ADDENDUM: Eliquis voucher given to patient. No other CM needs.

## 2017-07-25 NOTE — Care Management (Signed)
Benefits check for Eliquis in progress.

## 2017-07-25 NOTE — Discharge Summary (Signed)
Physician Discharge Summary  Christopher [name] ZSW:[id number] DOB: 1946-01-21 DOA: 07/22/2017  PCP: [name], MD  Admit date: 07/22/2017 Discharge date: 07/25/2017   Recommendations for Outpatient Follow-up:  For the duration he is likewise told to take Cardizem 60 mg by mouth twice a day as well as to follow-up in my office on Wednesday 6 days after this discharge Discharge Diagnoses:  Principal Problem:   Atrial fibrillation with RVR ([facility]) Active Problems:   S/P total knee replacement   History of gastric ulcer   Chronic back pain   BPH (benign prostatic hyperplasia)   Discharge Condition: Good  Filed Weights   07/23/17 0500 07/24/17 0500 07/25/17 0459  Weight: 72.1 kg (158 lb 15.2 oz) 72.9 kg (160 lb 11.5 oz) 72.1 kg (158 lb 15.2 oz)    History of present illness:  The patient is a 71 year old white male with a history of left total knee replacement 2 weeks ago history of GI bleed history of hypertension who presented with A. fib with rapid ventricular response to the emergency room was admitted to ICU was placed on a Cardizem infusion and subsequently converted back to sinus rhythm within 36 hours he was switched to oral Cardizem low dose was hemodynamically stable while in hospital his left leg was swollen ultrasound of lower extremity revealed complete total occlusion of peroneal vein all the veins were patent was felt to the risk-benefit ratio favored L requests administration long talk was had with patient and his wife he was placed on [place] 10 mg by mouth twice a day for 1 week and then to decrease it to 5 mg by mouth twice a day thereafter patient will follow-up my office in 5-6 days time with all of his empty medicine bottles to straighten out any questions regarding medications  Hospital Course:  See history of present illness above  Procedures:     Consultations:  Cardiology  Discharge Instructions  Discharge Instructions    Discharge instructions     Complete by:  As directed    Discharge patient    Complete by:  As directed    Discharge disposition:  01-Home or Self Care   Discharge patient date:  07/25/2017     Allergies as of 07/25/2017      Reactions   Morphine And Related Nausea And Vomiting   Propofol Nausea And Vomiting      Medication List    STOP taking these medications   ALPRAZolam 1 MG tablet Commonly known as:  XANAX   aspirin 325 MG EC tablet   docusate sodium 100 MG capsule Commonly known as:  COLACE   esomeprazole 40 MG capsule Commonly known as:  NEXIUM   FISH OIL ULTRA 1400 MG Caps   gabapentin 400 MG capsule Commonly known as:  NEURONTIN   Glucosamine HCl 1500 MG Tabs   methocarbamol 500 MG tablet Commonly known as:  ROBAXIN   SALONPAS PAIN RELIEF PATCH EX   SUPER B COMPLEX PO     TAKE these medications   alfuzosin 10 MG 24 hr tablet Commonly known as:  UROXATRAL Take 10 mg by mouth daily.   apixaban 5 MG Tabs tablet Commonly known as:  ELIQUIS Take 2 tablets (10 mg total) by mouth 2 (two) times daily.   diltiazem 60 MG tablet Commonly known as:  CARDIZEM Take 1 tablet (60 mg total) by mouth 2 (two) times daily.   HYDROcodone-acetaminophen 10-325 MG tablet Commonly known as:  NORCO Take 1 tablet by  mouth 3 (three) times daily as needed for moderate pain.   meloxicam 15 MG tablet Commonly known as:  MOBIC Take 15 mg by mouth daily.   pantoprazole 40 MG tablet Commonly known as:  PROTONIX Take 2 tablets (80 mg total) by mouth daily at 12 noon.   tamsulosin 0.4 MG Caps capsule Commonly known as:  FLOMAX Take 0.4 mg by mouth daily.   Vitamin D3 2000 units capsule Take 2,000 Units by mouth daily. What changed:  Another medication with the same name was added. Make sure you understand how and when to take each.   Cholecalciferol 2000 units Tabs Take 1 tablet (2,000 Units total) by mouth daily. What changed:  You were already taking a medication with the same name, and this  prescription was added. Make sure you understand how and when to take each.      Allergies  Allergen Reactions  . Morphine And Related Nausea And Vomiting  . Propofol Nausea And Vomiting      The results of significant diagnostics from this hospitalization (including imaging, microbiology, ancillary and laboratory) are listed below for reference.    Significant Diagnostic Studies: Ct Angio Chest Pe W Or Wo Contrast  Result Date: 07/23/2017 CLINICAL DATA:  Shortness of breath. EXAM: CT ANGIOGRAPHY CHEST WITH CONTRAST TECHNIQUE: Multidetector CT imaging of the chest was performed using the standard protocol during bolus administration of intravenous contrast. Multiplanar CT image reconstructions and MIPs were obtained to evaluate the vascular anatomy. CONTRAST:  100 mL of Isovue 370 intravenously. COMPARISON:  CT scan of March 07, 2016. FINDINGS: Cardiovascular: Satisfactory opacification of the pulmonary arteries to the segmental level. No evidence of pulmonary embolism. Normal heart size. No pericardial effusion. Coronary calcifications are noted. Mediastinum/Nodes: No enlarged mediastinal, hilar, or axillary lymph nodes. Thyroid gland, trachea, and esophagus demonstrate no significant findings. Lungs/Pleura: Mild right apical scarring is noted. No pneumothorax or pleural effusion is noted. Stable 4 mm nodule is noted in left lung base, which can be considered benign at this point with no further follow-up required. Upper Abdomen: No acute abnormality. Musculoskeletal: No chest wall abnormality. No acute or significant osseous findings. Review of the MIP images confirms the above findings. IMPRESSION: No evidence of pulmonary embolus. Coronary artery calcifications are noted suggesting coronary artery disease. Aortic Atherosclerosis (ICD10-I70.0). Electronically Signed   By: Marijo Conception, M.D.   On: 07/23/2017 11:54   US Venous Img Lower Unilateral Left  Result Date: 07/23/2017 CLINICAL DATA:   Left lower extremity swelling. EXAM: LEFT LOWER EXTREMITY VENOUS DOPPLER ULTRASOUND TECHNIQUE: Gray-scale sonography with graded compression, as well as color Doppler and duplex ultrasound were performed to evaluate the lower extremity deep venous systems from the level of the common femoral vein and including the common femoral, femoral, profunda femoral, popliteal and calf veins including the posterior tibial, peroneal and gastrocnemius veins when visible. The superficial great saphenous vein was also interrogated. Spectral Doppler was utilized to evaluate flow at rest and with distal augmentation maneuvers in the common femoral, femoral and popliteal veins. COMPARISON:  None. FINDINGS: Contralateral Common Femoral Vein: Respiratory phasicity is normal and symmetric with the symptomatic side. No evidence of thrombus. Normal compressibility. Common Femoral Vein: No evidence of thrombus. Normal compressibility, respiratory phasicity and response to augmentation. Saphenofemoral Junction: No evidence of thrombus. Normal compressibility and flow on color Doppler imaging. Profunda Femoral Vein: No evidence of thrombus. Normal compressibility and flow on color Doppler imaging. Femoral Vein: No evidence of thrombus. Normal compressibility, respiratory phasicity  and response to augmentation. Popliteal Vein: No evidence of thrombus. Normal compressibility, respiratory phasicity and response to augmentation. Calf Veins: Occlusive thrombus within the peroneal vein. The posterior and anterior tibial veins demonstrate normal compressibility and flow on color Doppler imaging. Superficial Great Saphenous Vein: No evidence of thrombus. Normal compressibility and flow on color Doppler imaging. Venous Reflux:  None. Other Findings:  None. 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 These results will be called to the ordering clinician or representative by the Radiologist Assistant, and communication documented in  the PACS or zVision Dashboard. Electronically Signed   By: Titus Dubin M.D.   On: 07/23/2017 15:31   Dg Chest Port 1 View  Result Date: 07/23/2017 CLINICAL DATA:  Chest palpitations, former smoker. EXAM: PORTABLE CHEST 1 VIEW COMPARISON:  None. FINDINGS: Heart is top-normal in size. No aortic aneurysm. Neural stimulator device projects up to the T8 level. The patient is status post ACDF of the cervical spine with partial inclusion of cervical hardware. There is minimal atelectasis at the left lung base. No pneumonic consolidation, effusion or pneumothorax. Osteoarthritis of the right AC joint. IMPRESSION: No active disease. Electronically Signed   By: Ashley Royalty M.D.   On: 07/23/2017 00:03    Microbiology: Recent Results (from the past 240 hour(s))  MRSA PCR Screening     Status: None   Collection Time: 07/23/17  2:28 AM  Result Value Ref Range Status   MRSA by PCR NEGATIVE NEGATIVE Final    Comment:        The GeneXpert MRSA Assay (FDA approved for NASAL specimens only), is one component of a comprehensive MRSA colonization surveillance program. It is not intended to diagnose MRSA infection nor to guide or monitor treatment for MRSA infections.      Labs: Basic Metabolic Panel:  Recent Labs Lab 07/22/17 2334 07/23/17 0300  NA 142 143  K 3.4* 3.9  CL 108 106  CO2 24 28  GLUCOSE 142* 150*  BUN 11 11  CREATININE 0.84 0.89  CALCIUM 9.7 9.3   Liver Function Tests: No results for input(s): AST, ALT, ALKPHOS, BILITOT, PROT, ALBUMIN in the last 168 hours. No results for input(s): LIPASE, AMYLASE in the last 168 hours. No results for input(s): AMMONIA in the last 168 hours. CBC:  Recent Labs Lab 07/22/17 2334 07/23/17 0300 07/24/17 0428 07/25/17 0553  WBC 7.2 7.5 5.9 5.5  NEUTROABS 3.9  --   --   --   HGB 12.9* 12.8* 12.6* 12.4*  HCT 38.3* 38.2* 37.6* 37.2*  MCV 91.2 92.0 91.9 91.9  PLT 235 229 240 247   Cardiac Enzymes:  Recent Labs Lab 07/23/17 0300  07/23/17 0756 07/23/17 1403  TROPONINI <0.03 <0.03 <0.03   BNP: BNP (last 3 results) No results for input(s): BNP in the last 8760 hours.  ProBNP (last 3 results) No results for input(s): PROBNP in the last 8760 hours.  CBG: No results for input(s): GLUCAP in the last 168 hours.     Signed:  Genelle Economou Jerilynn Mages  [facility] Pager: [phone] 07/25/2017, 12:46 PM

## 2017-07-26 ENCOUNTER — Ambulatory Visit (HOSPITAL_COMMUNITY): Payer: Medicare Other

## 2017-07-29 ENCOUNTER — Ambulatory Visit (HOSPITAL_COMMUNITY): Payer: Medicare Other | Admitting: Physical Therapy

## 2017-07-31 ENCOUNTER — Encounter (HOSPITAL_COMMUNITY): Payer: Medicare Other | Admitting: Physical Therapy

## 2017-08-02 ENCOUNTER — Encounter (HOSPITAL_COMMUNITY): Payer: Medicare Other | Admitting: Physical Therapy

## 2017-10-09 ENCOUNTER — Emergency Department (HOSPITAL_COMMUNITY)
Admission: EM | Admit: 2017-10-09 | Discharge: 2017-10-10 | Disposition: A | Discharge status: Home / Self Care | Payer: Medicare Other | Attending: Emergency Medicine | Admitting: Emergency Medicine

## 2017-10-09 ENCOUNTER — Encounter (HOSPITAL_COMMUNITY): Payer: Self-pay | Admitting: *Deleted

## 2017-10-09 ENCOUNTER — Emergency Department (HOSPITAL_COMMUNITY): Payer: Medicare Other

## 2017-10-09 DIAGNOSIS — Z87891 Personal history of nicotine dependence: Secondary | ICD-10-CM | POA: Diagnosis not present

## 2017-10-09 DIAGNOSIS — I4891 Unspecified atrial fibrillation: Secondary | ICD-10-CM | POA: Diagnosis not present

## 2017-10-09 DIAGNOSIS — Z79899 Other long term (current) drug therapy: Secondary | ICD-10-CM | POA: Insufficient documentation

## 2017-10-09 DIAGNOSIS — R002 Palpitations: Secondary | ICD-10-CM | POA: Diagnosis present

## 2017-10-09 DIAGNOSIS — Z96652 Presence of left artificial knee joint: Secondary | ICD-10-CM | POA: Diagnosis not present

## 2017-10-09 HISTORY — DX: Unspecified atrial fibrillation: I48.91

## 2017-10-09 LAB — CBC
HCT: 42.9 % (ref 39.0–52.0)
Hemoglobin: 14.6 g/dL (ref 13.0–17.0)
MCH: 30.9 pg (ref 26.0–34.0)
MCHC: 34 g/dL (ref 30.0–36.0)
MCV: 90.9 fL (ref 78.0–100.0)
Platelets: 221 10*3/uL (ref 150–400)
RBC: 4.72 MIL/uL (ref 4.22–5.81)
RDW: 13.2 % (ref 11.5–15.5)
WBC: 6 10*3/uL (ref 4.0–10.5)

## 2017-10-09 LAB — BASIC METABOLIC PANEL
Anion gap: 12 (ref 5–15)
BUN: 12 mg/dL (ref 6–20)
CO2: 23 mmol/L (ref 22–32)
Calcium: 9.7 mg/dL (ref 8.9–10.3)
Chloride: 104 mmol/L (ref 101–111)
Creatinine, Ser: 0.88 mg/dL (ref 0.61–1.24)
GFR calc Af Amer: >60 mL/min (ref >60)
GFR calc non Af Amer: >60 mL/min (ref >60)
Glucose, Bld: 117 mg/dL — ABNORMAL HIGH (ref 65–99)
Potassium: 3.3 mmol/L — ABNORMAL LOW (ref 3.5–5.1)
Sodium: 139 mmol/L (ref 135–145)

## 2017-10-09 LAB — MAGNESIUM: Magnesium: 2 mg/dL (ref 1.7–2.4)

## 2017-10-09 MED ORDER — DILTIAZEM HCL 30 MG PO TABS
90.0000 mg | ORAL_TABLET | Freq: Once | ORAL | Status: AC
  Administered 2017-10-09: 90 mg via ORAL
  Filled 2017-10-09: qty 3

## 2017-10-09 MED ORDER — DILTIAZEM HCL 60 MG PO TABS: 60.0000 mg | ORAL_TABLET | Freq: Three times a day (TID) | ORAL | 1 refills | Status: DC

## 2017-10-09 MED ORDER — POTASSIUM CHLORIDE CRYS ER 20 MEQ PO TBCR
40.0000 meq | EXTENDED_RELEASE_TABLET | Freq: Once | ORAL | Status: AC
  Administered 2017-10-09: 40 meq via ORAL
  Filled 2017-10-09: qty 2

## 2017-10-09 NOTE — ED Provider Notes (Signed)
[facility] EMERGENCY DEPARTMENT Provider Note   CSN: [id number] Arrival date & time: 10/09/17  2047     History   Chief Complaint Chief Complaint  Patient presents with  . Palpitations    HPI Christopher [name] is a 71 y.o. male.  HPI   53yM with palpitations. Recent diagnosis of afib. Re-occurence of symptoms this afternoon. Felt like heart racing and "thumping." It feels irregular to him. Denies pain. No dyspnea. Feels tired though. Reports compliance with meds.   Past Medical History:  Diagnosis Date  . A-fib ([place])   . Arthritis    "all over my body" (06/17/2017)  . BPH (benign prostatic hyperplasia)    takes Proscar daily  . Chronic back pain    DDD; "upper and lower" (06/17/2017)  . Constipation    takes Colace daily  . DDD (degenerative disc disease), cervical   . Enlarged prostate   . GERD (gastroesophageal reflux disease)    takes Nexium daily  . H/O hiatal hernia   . History of colon polyps    benign  . History of gastric ulcer   . Insomnia    takes Xanax nightly  . Joint capsule tear    left knee  . Joint pain   . Joint swelling   . Macular degeneration   . Muscle spasm of back    takes Valium daily as needed  . Peripheral neuropathy    takes Gabapentin daily  . PONV (postoperative nausea and vomiting)   . Urinary hesitancy   . Weakness    numbness and tingling    Patient Active Problem List   Diagnosis Date Noted  . Atrial fibrillation with RVR ([place]) 07/23/2017  . History of gastric ulcer   . Chronic back pain   . BPH (benign prostatic hyperplasia)   . 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 Spondylolisthesis of lumbar region 03/05/2014  . Barrett esophagus 02/24/2013  . Personal history of colonic polyps 02/24/2013  . Epigastric pain 01/06/2013  . Encounter for screening colonoscopy 01/06/2013  . Pain in joint, shoulder region 07/09/2012  . Muscle weakness (generalized) 07/09/2012    Past Surgical History:  Procedure Laterality Date    . ANTERIOR CERVICAL DECOMP/DISCECTOMY FUSION  ~ 1991   "took bone from hip"  . ANTERIOR CERVICAL DECOMP/DISCECTOMY FUSION  X 2   "put plate in"  . ANTERIOR CERVICAL DISCECTOMY  <11/2005    5-6 and 4-5./notes 05/02/2011  . APPENDECTOMY    . BACK SURGERY    . CARDIAC CATHETERIZATION    . CARPAL TUNNEL RELEASE Bilateral 10/2005 - 11/2005   right-left Archie Endo 05/02/2011  . CATARACT EXTRACTION W/PHACO Right 05/24/2014   Procedure: CATARACT EXTRACTION PHACO AND INTRAOCULAR LENS PLACEMENT (IOC);  Surgeon: [name], MD;  Location: [facility];  Service: Ophthalmology;  Laterality: Right;  CDE 6.74  . CATARACT EXTRACTION W/PHACO Left 06/17/2014   Procedure: CATARACT EXTRACTION PHACO AND INTRAOCULAR LENS PLACEMENT (IOC);  Surgeon: [name], MD;  Location: [facility];  Service: Ophthalmology;  Laterality: Left;  CDE:4.65  . COLONOSCOPY WITH ESOPHAGOGASTRODUODENOSCOPY (EGD) N/A 01/22/2013   Procedure: COLONOSCOPY WITH ESOPHAGOGASTRODUODENOSCOPY (EGD);  Surgeon: [name], MD;  [facility];  Service: Endoscopy;  Laterality: N/A;  140  . EYE SURGERY Bilateral    "cleaned film w/laser; since my 1st cataract OR"  . JOINT REPLACEMENT    . POSTERIOR LUMBAR FUSION     "put a plate in"  . QUADRICEPS TENDON REPAIR Left 07/01/2017  . QUADRICEPS  TENDON REPAIR Left 07/01/2017   Procedure: REPAIR QUADRICEP TENDON;  Surgeon: [name], MD;  Location: [place];  Service: Orthopedics;  Laterality: Left;  . SHOULDER ARTHROSCOPY Bilateral   . SPINAL CORD STIMULATOR INSERTION N/A 09/23/2015   Procedure: LUMBAR SPINAL CORD STIMULATOR INSERTION;  Surgeon: [name], MD;  Location: [place] NEURO ORS;  Service: Neurosurgery;  Laterality: N/A;  LUMBAR SPINAL CORD STIMULATOR INSERTION  . TOTAL KNEE ARTHROPLASTY Left 06/17/2017  . TOTAL KNEE ARTHROPLASTY Left 06/17/2017   Procedure: TOTAL KNEE ARTHROPLASTY;  Surgeon: [name], MD;  Location: [place];  Service: Orthopedics;  Laterality: Left;       Home Medications     Prior to Admission medications   Medication Sig Start Date End Date Taking? Authorizing Provider  alfuzosin (UROXATRAL) 10 MG 24 hr tablet Take 10 mg by mouth daily.    [provider]  apixaban (ELIQUIS) 5 MG TABS tablet Take 2 tablets (10 mg total) by mouth 2 (two) times daily. 07/25/17 08/01/17  [name], MD  Cholecalciferol (VITAMIN D3) 2000 units capsule Take 2,000 Units by mouth daily.    [provider]  diltiazem (CARDIZEM) 60 MG tablet Take 1 tablet (60 mg total) by mouth 2 (two) times daily. 07/25/17   [name], MD  HYDROcodone-acetaminophen (NORCO) 10-325 MG tablet Take 1 tablet by mouth 3 (three) times daily as needed for moderate pain.    [provider]  meloxicam (MOBIC) 15 MG tablet Take 15 mg by mouth daily. 04/30/17   [provider]  pantoprazole (PROTONIX) 40 MG tablet Take 2 tablets (80 mg total) by mouth daily at 12 noon. 07/26/17 08/25/17  [name], MD  tamsulosin (FLOMAX) 0.4 MG CAPS capsule Take 0.4 mg by mouth daily.    [provider]    Family History No family history on file.  Social History Social History  Substance Use Topics  . Smoking status: Former Smoker    Packs/day: 1.00    Years: 30.00    Types: Cigarettes  . Smokeless tobacco: Former Systems developer    Types: Chew     Comment: quit smoking  & chewing by [place]  . Alcohol use 12.6 oz/week    9 Glasses of wine, 12 Cans of beer per week     Comment:  "none since surgery " 06/28/17     Allergies   Morphine and related and Propofol   Review of Systems Review of Systems  All systems reviewed and negative, other than as noted in HPI.  Physical Exam Updated Vital Signs BP 121/78 (BP Location: Left Arm)   Pulse (!) 101   Temp 97.7 F (36.5 C) (Oral)   Resp 18   Ht 5\' 8"  (1.727 m)   Wt 69.9 kg (154 lb)   SpO2 94%   BMI 23.42 kg/m   Physical Exam  Constitutional: He appears well-developed and well-nourished. No distress.  HENT:   Head: Normocephalic and atraumatic.  Eyes: Conjunctivae are normal. Right eye exhibits no discharge. Left eye exhibits no discharge.  Neck: Neck supple.  Cardiovascular: Normal heart sounds.  Exam reveals no gallop and no friction rub.   No murmur heard. Mild tachycardia. irreg irreg.   Pulmonary/Chest: Effort normal and breath sounds normal. No respiratory distress.  Abdominal: Soft. He exhibits no distension. There is no tenderness.  Musculoskeletal: He exhibits no edema or tenderness.  Neurological: He is alert.  Skin: Skin is warm and dry.  Psychiatric: He has a normal mood and affect. His behavior  is normal. Thought content normal.  Nursing note and vitals reviewed.    ED Treatments / Results  Labs (all labs ordered are listed, but only abnormal results are displayed) Labs Reviewed  BASIC METABOLIC PANEL - Abnormal; Notable for the following:       Result Value   Potassium 3.3 (*)    Glucose, Bld 117 (*)    All other components within normal limits  CBC  MAGNESIUM    EKG  EKG Interpretation None       Radiology No results found.  Procedures Procedures (including critical care time)  Medications Ordered in ED Medications - No data to display   Initial Impression / Assessment and Plan / ED Course  I have reviewed the triage vital signs and the nursing notes.  Pertinent labs & imaging results that were available during my care of the patient were reviewed by me and considered in my medical decision making (see chart for details).     71yM with palpitations. Afib with RVR. Hx of the same. From his description it sounds like he has either been it consistently or at least frequent episodes. Rate around 100. BP fine.  On cardizem 60mg  BID. Cardiology note during hospitalization in August mentions TID dosing but DC summary stating BID. Will increase to TID for time being. Mention of cardioversion 4 weeks after discharge. Pt doesn't seem to be aware that he needed  to follow-up with cardiology.   Pt reports compliance with eliquis. Discussed ED cardioversion. Discussed benefits/risks/alternatives. He is hesitant. He would prefer to FU with cardiology.   Final Clinical Impressions(s) / ED Diagnoses   Final diagnoses:  Atrial fibrillation with rapid ventricular response [facility])    New Prescriptions New Prescriptions   No medications on file     Virgel Manifold, MD 10/15/17 [phone]

## 2017-10-09 NOTE — ED Triage Notes (Signed)
Pt c/o irregular heartbeat that started this afternoon with feeling tired"

## 2017-10-09 NOTE — ED Notes (Signed)
Pt placed on 1L [place]. O2 Sat 93%.

## 2017-10-13 NOTE — Progress Notes (Signed)
Cardiology Office Note   Date:  10/14/2017   ID:  [name], [name] [birth year], MRN [id number]  PCP:  [name], MD  Cardiologist:  Johnsie Cancel   Chief Complaint  Patient presents with  . Atrial Fibrillation     History of Present Illness: Christopher [name] is a 71 y.o. male who presents for ongoing assessment and management of PAF. We saw him on consultation while hospitalized for total left knee replacement in August of 2018. He was placed on oral diltiazem 60 mg Q 12 hours.CHADS VASC Score of 1, but was to be on anticoagulation due to recent DVT. He was placed on Apixaban 5 mg BID.[name]  He was seen in the emergency room on 10/09/2017 due to rapid atrial fibrillation, heart rate around 100 bpm.  His diltiazem was increased to 60 mg 3 times daily.  He has just recently started taking it within the last 48 hours.  Review of notes reveals that he was to be on 60 mg 3 times daily on discharge, but apparently was discharged on twice daily dosing, which may have exacerbated her heart rate elevation, leading to ER visit.  He comes today feeling well.  He has occasional extra beats which he states he feels into his neck.  He is tolerating the new dose of medication without dizziness or feeling  .  Otherwise he denies any rapid heart rhythm, dyspnea on exertion, or dizziness.  He is tolerating Eliquis without complaints bleeding or bruising.   Past Medical History:  Diagnosis Date  . A-fib ([place])   . Arthritis    "all over my body" (06/17/2017)  . BPH (benign prostatic hyperplasia)    takes Proscar daily  . Chronic back pain    DDD; "upper and lower" (06/17/2017)  . Constipation    takes Colace daily  . DDD (degenerative disc disease), cervical   . Enlarged prostate   . GERD (gastroesophageal reflux disease)    takes Nexium daily  . H/O hiatal hernia   . History of colon polyps    benign  . History of gastric ulcer   . Insomnia    takes Xanax nightly  . Joint capsule tear    left knee  .  Joint pain   . Joint swelling   . Macular degeneration   . Muscle spasm of back    takes Valium daily as needed  . Peripheral neuropathy    takes Gabapentin daily  . PONV (postoperative nausea and vomiting)   . Urinary hesitancy   . Weakness    numbness and tingling    Past Surgical History:  Procedure Laterality Date  . ANTERIOR CERVICAL DECOMP/DISCECTOMY FUSION  ~ 1991   "took bone from hip"  . ANTERIOR CERVICAL DECOMP/DISCECTOMY FUSION  X 2   "put plate in"  . ANTERIOR CERVICAL DISCECTOMY  <11/2005    5-6 and 4-5./notes 05/02/2011  . APPENDECTOMY    . BACK SURGERY    . CARDIAC CATHETERIZATION    . CARPAL TUNNEL RELEASE Bilateral 10/2005 - 11/2005   right-left Archie Endo 05/02/2011  . CATARACT EXTRACTION W/PHACO Right 05/24/2014   Procedure: CATARACT EXTRACTION PHACO AND INTRAOCULAR LENS PLACEMENT (IOC);  Surgeon: [name], MD;  Location: [facility];  Service: Ophthalmology;  Laterality: Right;  CDE 6.74  . CATARACT EXTRACTION W/PHACO Left 06/17/2014   Procedure: CATARACT EXTRACTION PHACO AND INTRAOCULAR LENS PLACEMENT (IOC);  Surgeon: [name], MD;  Location: [facility];  Service: Ophthalmology;  Laterality: Left;  CDE:4.65  . COLONOSCOPY  WITH ESOPHAGOGASTRODUODENOSCOPY (EGD) N/A 01/22/2013   Procedure: COLONOSCOPY WITH ESOPHAGOGASTRODUODENOSCOPY (EGD);  Surgeon: [name], MD;  [facility]: [facility];  Service: Endoscopy;  Laterality: N/A;  140  . EYE SURGERY Bilateral    "cleaned film w/laser; since my 1st cataract OR"  . JOINT REPLACEMENT    . POSTERIOR LUMBAR FUSION     "put a plate in"  . QUADRICEPS TENDON REPAIR Left 07/01/2017  . QUADRICEPS TENDON REPAIR Left 07/01/2017   Procedure: REPAIR QUADRICEP TENDON;  Surgeon: [name], MD;  Location: [place];  Service: Orthopedics;  Laterality: Left;  . SHOULDER ARTHROSCOPY Bilateral   . SPINAL CORD STIMULATOR INSERTION N/A 09/23/2015   Procedure: LUMBAR SPINAL CORD STIMULATOR INSERTION;  Surgeon: [name], MD;  Location: [place]  NEURO ORS;  Service: Neurosurgery;  Laterality: N/A;  LUMBAR SPINAL CORD STIMULATOR INSERTION  . TOTAL KNEE ARTHROPLASTY Left 06/17/2017  . TOTAL KNEE ARTHROPLASTY Left 06/17/2017   Procedure: TOTAL KNEE ARTHROPLASTY;  Surgeon: [name], MD;  Location: [place];  Service: Orthopedics;  Laterality: Left;     Current Outpatient Prescriptions  Medication Sig Dispense Refill  . alfuzosin (UROXATRAL) 10 MG 24 hr tablet Take 10 mg by mouth daily.    [name] ALPRAZolam (XANAX) 1 MG tablet Take 1 mg by mouth at bedtime.  0  . apixaban (ELIQUIS) 5 MG TABS tablet Take 2 tablets (10 mg total) by mouth 2 (two) times daily. 14 tablet 0  . Cholecalciferol (VITAMIN D3) 2000 units capsule Take 2,000 Units by mouth daily.    [name] diltiazem (CARDIZEM) 60 MG tablet Take 1 tablet (60 mg total) by mouth 3 (three) times daily. 90 tablet 1  . gabapentin (NEURONTIN) 400 MG capsule Take 400 mg by mouth 2 (two) times daily.  2  . HYDROcodone-acetaminophen (NORCO) 10-325 MG tablet Take 1 tablet by mouth 3 (three) times daily as needed for moderate pain.    . naproxen (NAPROSYN) 500 MG tablet Take 500 mg by mouth 2 (two) times daily.  5  . Omega-3 Fatty Acids (FISH OIL) 1000 MG CAPS Take by mouth.    . pantoprazole (PROTONIX) 40 MG tablet Take 2 tablets (80 mg total) by mouth daily at 12 noon. 30 tablet 4  . tamsulosin (FLOMAX) 0.4 MG CAPS capsule Take 0.4 mg by mouth daily.    . vitamin B-12 (CYANOCOBALAMIN) 100 MCG tablet Take 100 mcg by mouth daily.    . Magnesium 200 MG TABS Take 1 tablet (200 mg total) by mouth daily. 30 each 11   No current facility-administered medications for this visit.     Allergies:   Morphine and related and Propofol    Social History:  The patient  reports that he quit smoking about 30 years ago. His smoking use included Cigarettes. He has a 30.00 pack-year smoking history. He has quit using smokeless tobacco. His smokeless tobacco use included Chew. He reports that he drinks about 12.6 oz of  alcohol per week . He reports that he does not use drugs.   Family History:  The patient's family history is not on file.    ROS: All other systems are reviewed and negative. Unless otherwise mentioned in H&P    PHYSICAL EXAM: VS:  BP 104/60 (BP Location: Right Arm)   Pulse 73   Ht 5\' 8"  (1.727 m)   Wt 166 lb (75.3 kg)   SpO2 93%   BMI 25.24 kg/m  , BMI Body mass index is 25.24 kg/m. GEN: Well nourished, well  developed, in no acute distress  HEENT: normal  Neck: no JVD, carotid bruits, or masses Cardiac: RRR; no murmurs, rubs, or gallops,no edema  Respiratory:  Clear to auscultation bilaterally, normal work of breathing GI: soft, nontender, nondistended, + BS MS: no deformity or atrophy  Skin: warm and dry, no rash Neuro:  Strength and sensation are intact Psych: euthymic mood, full affect   Recent Labs: 06/07/2017: ALT 18 07/22/2017: TSH 2.656 10/09/2017: BUN 12; Creatinine, Ser 0.88; Hemoglobin 14.6; Magnesium 2.0; Platelets 221; Potassium 3.3; Sodium 139    Lipid Panel No results found for: CHOL, TRIG, HDL, CHOLHDL, VLDL, LDLCALC, LDLDIRECT    Wt Readings from Last 3 Encounters:  10/14/17 166 lb (75.3 kg)  10/09/17 154 lb (69.9 kg)  07/25/17 158 lb 15.2 oz (72.1 kg)     Echocardiogram 07/23/2017 Left ventricle: The cavity size was normal. Systolic function was   normal. The estimated ejection fraction was in the range of 55%   to 60%. Wall motion was normal; there were no regional wall   motion abnormalities. Left ventricular diastolic function   parameters were normal. - Left atrium: The atrium was mildly dilated. - Atrial septum: A patent foramen ovale cannot be excluded.    ASSESSMENT AND PLAN:  1.  Paroxysmal atrial fibrillation: The patient was seen in the emergency room on 10/09/2017 due to feelings of rapid heart rhythm.  He was found to have a heart rate of 102 bpm.  He apparently was taking diltiazem 60 mg twice daily, and was therefore increased to 60  mg 3 times daily without further complaints.  He is tolerating this dose without problems and remains in normal sinus rhythm.  He continues on Eliquis without complaints of bleeding.    I will continue him on 3 times daily diltiazem and not make any changes at this time.  The patient is tolerating the medication regimen.  We will follow-up with him again in 6 months unless he is symptomatic.  I have told him if he continues to have irregular rapid heart rhythm we may need to see him sooner and he is to call us.  Otherwise no changes in his regimen.  2.  GERD: He has been seen by PCP and taken off of Nexium and started on Protonix.  The patient is complaining of rare dumping.  A feeling as if his heart stops.  I believe he is having some PVCs.  He was noted to be hypokalemic in the emergency room.  I therefore will begin him on magnesium supplement 200 mg daily as PPI can cause hypomagnesemia leading to palpitations and PVCs.  Follow-up labs are due by PCP.   Current medicines are reviewed at length with the patient today.    Labs/ tests ordered today include: Per PCP. Phill Myron. [place] Pugh, ANP, AACC   10/14/2017 2:47 PM    [facility] [address], [place], [place] [address] Phone: [phone]; Fax: [phone]

## 2017-10-14 ENCOUNTER — Ambulatory Visit (INDEPENDENT_AMBULATORY_CARE_PROVIDER_SITE_OTHER): Payer: Medicare Other | Admitting: Adult Health

## 2017-10-14 ENCOUNTER — Encounter: Payer: Self-pay | Admitting: Adult Health

## 2017-10-14 VITALS — BP 104/60 | HR 73 | Ht 68.0 in | Wt 166.0 lb

## 2017-10-14 DIAGNOSIS — I480 Paroxysmal atrial fibrillation: Secondary | ICD-10-CM

## 2017-10-14 DIAGNOSIS — K219 Gastro-esophageal reflux disease without esophagitis: Secondary | ICD-10-CM

## 2017-10-14 MED ORDER — MAGNESIUM 200 MG PO TABS: 200.0000 mg | ORAL_TABLET | Freq: Every day | ORAL | 11 refills | Status: DC

## 2017-10-14 NOTE — Patient Instructions (Signed)
Medication Instructions:  START MAGNESIUM 200 MG DAILY    Labwork: NONE  Testing/Procedures: NONE  Follow-Up: Your physician wants you to follow-up in: 6 MONTHS.  You will receive a reminder letter in the mail two months in advance. If you don't receive a letter, please call our office to schedule the follow-up appointment.   Any Other Special Instructions Will Be Listed Below (If Applicable).     If you need a refill on your cardiac medications before your next appointment, please call your pharmacy.   

## 2017-10-17 ENCOUNTER — Encounter: Payer: Self-pay | Admitting: Adult Health

## 2017-10-31 ENCOUNTER — Encounter (HOSPITAL_COMMUNITY): Payer: Self-pay | Admitting: Physical Therapy

## 2017-10-31 NOTE — Therapy (Signed)
[place], [place], [address] Phone: [phone]   Fax:  [phone]  Patient Details  Name: Christopher [name] MRN: [id number] Date of Birth: 12/03/1946 Referring Provider:  No ref. provider found  Encounter Date: 10/31/2017   Hunt Oris 10/31/2017, 8:24 AM  [place], [place], [address] Phone: [phone]   Fax:  [phone]

## 2017-10-31 NOTE — Therapy (Signed)
[place], [place], [address] Phone: [phone]   Fax:  [phone]  Patient Details  Name: Christopher [name] MRN: [id number] Date of Birth: 24-May-1946 Referring Provider:  No ref. provider found  Encounter Date: 10/31/2017  PHYSICAL THERAPY DISCHARGE SUMMARY  Visits from Start of Care: 3  Current functional level related to goals / functional outcomes: MD transferred patient to a different rehab center; DC per MD request.    Remaining deficits: Unable to assess    Education / Equipment: None  Plan: Patient agrees to discharge.  Patient goals were not met. Patient is being discharged due to the physician's request.  ?????       Deniece Ree PT, DPT, CBIS  Supplemental Physical Therapist [place], [place], [address] Phone: [phone]   Fax:  [phone]

## 2017-11-04 ENCOUNTER — Other Ambulatory Visit (HOSPITAL_COMMUNITY)
Admission: RE | Admit: 2017-11-04 | Discharge: 2017-11-04 | Disposition: A | Discharge status: Home / Self Care | Payer: Medicare Other | Source: Ambulatory Visit | Attending: Internal Medicine | Admitting: Internal Medicine

## 2017-11-04 DIAGNOSIS — I110 Hypertensive heart disease with heart failure: Secondary | ICD-10-CM | POA: Insufficient documentation

## 2017-11-04 DIAGNOSIS — E559 Vitamin D deficiency, unspecified: Secondary | ICD-10-CM | POA: Diagnosis present

## 2017-11-04 DIAGNOSIS — D510 Vitamin B12 deficiency anemia due to intrinsic factor deficiency: Secondary | ICD-10-CM | POA: Diagnosis present

## 2017-11-04 DIAGNOSIS — Z125 Encounter for screening for malignant neoplasm of prostate: Secondary | ICD-10-CM | POA: Diagnosis present

## 2017-11-04 LAB — COMPREHENSIVE METABOLIC PANEL
ALT: 20 U/L (ref 17–63)
AST: 25 U/L (ref 15–41)
Albumin: 4.3 g/dL (ref 3.5–5.0)
Alkaline Phosphatase: 67 U/L (ref 38–126)
Anion gap: 7 (ref 5–15)
BUN: 17 mg/dL (ref 6–20)
CO2: 26 mmol/L (ref 22–32)
Calcium: 9.3 mg/dL (ref 8.9–10.3)
Chloride: 105 mmol/L (ref 101–111)
Creatinine, Ser: 0.79 mg/dL (ref 0.61–1.24)
GFR calc Af Amer: >60 mL/min (ref >60)
GFR calc non Af Amer: >60 mL/min (ref >60)
Glucose, Bld: 81 mg/dL (ref 65–99)
Potassium: 3.8 mmol/L (ref 3.5–5.1)
Sodium: 138 mmol/L (ref 135–145)
Total Bilirubin: 0.7 mg/dL (ref 0.3–1.2)
Total Protein: 7 g/dL (ref 6.5–8.1)

## 2017-11-04 LAB — CBC
HCT: 42 % (ref 39.0–52.0)
Hemoglobin: 13.5 g/dL (ref 13.0–17.0)
MCH: 29.8 pg (ref 26.0–34.0)
MCHC: 32.1 g/dL (ref 30.0–36.0)
MCV: 92.7 fL (ref 78.0–100.0)
Platelets: 237 10*3/uL (ref 150–400)
RBC: 4.53 MIL/uL (ref 4.22–5.81)
RDW: 13.2 % (ref 11.5–15.5)
WBC: 4.8 10*3/uL (ref 4.0–10.5)

## 2017-11-04 LAB — LIPID PANEL
Cholesterol: 193 mg/dL (ref 0–200)
HDL: 52 mg/dL (ref >40)
LDL Cholesterol: 127 mg/dL — ABNORMAL HIGH (ref 0–99)
Total CHOL/HDL Ratio: 3.7 RATIO
Triglycerides: 72 mg/dL (ref <150)
VLDL: 14 mg/dL (ref 0–40)

## 2017-11-04 LAB — TSH: TSH: 2.287 u[IU]/mL (ref 0.350–4.500)

## 2017-11-05 LAB — VITAMIN D 25 HYDROXY (VIT D DEFICIENCY, FRACTURES): Vit D, 25-Hydroxy: 37.2 ng/mL (ref 30.0–100.0)

## 2017-11-05 LAB — PSA: Prostatic Specific Antigen: 1.6 ng/mL (ref 0.00–4.00)

## 2017-11-05 LAB — T4, FREE: Free T4: 0.88 ng/dL (ref 0.61–1.12)

## 2017-11-15 ENCOUNTER — Other Ambulatory Visit: Payer: Self-pay | Admitting: Orthopedic Surgery

## 2017-12-05 NOTE — Progress Notes (Addendum)
MOQ:HUTMLYYT, Delfino Lovett, MD  Cardiologist: Steward Ros, MD  EKG: 10/10/17 in EPIC  Stress test: pt denies past 5 years  ECHO: 07/23/17 in EPIC  Cardiac Cath: 08/27/2005 in [place] (under encounters)  Chest x-ray:10/09/17 in Epic  Patient was on Eliquis for atrial fibrillation.  He was told to stop the medication 11/29/17 by his PCP because he no longer has atrial fibrillation (per pt).

## 2017-12-05 NOTE — Pre-Procedure Instructions (Signed)
Christopher [name]  12/05/2017      [id number] - [place], [place] - [address] [place] [address] Phone: [phone] Fax: [phone]    Your procedure is scheduled on December 16, 2017.  Report to [facility] Admitting at 715 AM.  Call this number if you have problems the morning of surgery:  [phone]   Remember:  Do not eat food or drink liquids after midnight.  Take these medicines the morning of surgery with A SIP OF WATER alfuzosin (uroxatral), diltiazem (cardizem), gabapentin (neurontin), hydrocodone-acetaminophen (norco), pantoprazole (protonix), tamsulosin (flomax).  Stop Eliquis as instructed by your surgeon.  7 days prior to surgery STOP taking any Aspirin (unless otherwise instructed by your surgeon), Aleve, Naproxen, Ibuprofen, Motrin, Advil, Goody's, BC's, all herbal medications, fish oil, and all vitamins  Continue all other medications as instructed by your physician except follow the above medication instructions before surgery   Do not wear jewelry, make-up or nail polish.  Do not wear lotions, powders, or perfumes, or deodorant.  Men may shave face and neck.  Do not bring valuables to the hospital.  [facility] is not responsible for any belongings or valuables.  Contacts, dentures or bridgework may not be worn into surgery.  Leave your suitcase in the car.  After surgery it may be brought to your room.  For patients admitted to the hospital, discharge time will be determined by your treatment team.  Patients discharged the day of surgery will not be allowed to drive home.    Special instructions:   [facility]- Preparing For Surgery  Before surgery, you can play an important role. Because skin is not sterile, your skin needs to be as free of germs as possible. You can reduce the number of germs on your skin by washing with CHG (chlorahexidine gluconate) Soap before surgery.  CHG is an  antiseptic cleaner which kills germs and bonds with the skin to continue killing germs even after washing.  Please do not use if you have an allergy to CHG or antibacterial soaps. If your skin becomes reddened/irritated stop using the CHG.  Do not shave (including legs and underarms) for at least 48 hours prior to first CHG shower. It is OK to shave your face.  Please follow these instructions carefully.   1. Shower the NIGHT BEFORE SURGERY and the MORNING OF SURGERY with CHG.   2. If you chose to wash your hair, wash your hair first as usual with your normal shampoo.  3. After you shampoo, rinse your hair and body thoroughly to remove the shampoo.  4. Use CHG as you would any other liquid soap. You can apply CHG directly to the skin and wash gently with a scrungie or a clean washcloth.   5. Apply the CHG Soap to your body ONLY FROM THE NECK DOWN.  Do not use on open wounds or open sores. Avoid contact with your eyes, ears, mouth and genitals (private parts). Wash Face and genitals (private parts)  with your normal soap.  6. Wash thoroughly, paying special attention to the area where your surgery will be performed.  7. Thoroughly rinse your body with warm water from the neck down.  8. DO NOT shower/wash with your normal soap after using and rinsing off the CHG Soap.  9. Pat yourself dry with a CLEAN TOWEL.  10. Wear CLEAN PAJAMAS to bed the night before surgery, wear comfortable clothes the morning  of surgery  11. Place CLEAN SHEETS on your bed the night of your first shower and DO NOT SLEEP WITH PETS.    Day of Surgery: Do not apply any deodorants/lotions. Please wear clean clothes to the hospital/surgery center.     Please read over the following fact sheets that you were given. Pain Booklet, Coughing and Deep Breathing, MRSA Information and Surgical Site Infection Prevention

## 2017-12-06 ENCOUNTER — Encounter (HOSPITAL_COMMUNITY)
Admission: RE | Admit: 2017-12-06 | Discharge: 2017-12-06 | Disposition: A | Discharge status: Home / Self Care | Payer: Medicare Other | Source: Ambulatory Visit | Attending: Orthopedic Surgery | Admitting: Orthopedic Surgery

## 2017-12-06 ENCOUNTER — Encounter (HOSPITAL_COMMUNITY): Payer: Self-pay

## 2017-12-06 DIAGNOSIS — I4891 Unspecified atrial fibrillation: Secondary | ICD-10-CM | POA: Insufficient documentation

## 2017-12-06 DIAGNOSIS — G4733 Obstructive sleep apnea (adult) (pediatric): Secondary | ICD-10-CM | POA: Insufficient documentation

## 2017-12-06 DIAGNOSIS — Z86718 Personal history of other venous thrombosis and embolism: Secondary | ICD-10-CM | POA: Insufficient documentation

## 2017-12-06 DIAGNOSIS — K449 Diaphragmatic hernia without obstruction or gangrene: Secondary | ICD-10-CM | POA: Diagnosis not present

## 2017-12-06 DIAGNOSIS — K279 Peptic ulcer, site unspecified, unspecified as acute or chronic, without hemorrhage or perforation: Secondary | ICD-10-CM | POA: Insufficient documentation

## 2017-12-06 DIAGNOSIS — N400 Enlarged prostate without lower urinary tract symptoms: Secondary | ICD-10-CM | POA: Insufficient documentation

## 2017-12-06 DIAGNOSIS — Z9889 Other specified postprocedural states: Secondary | ICD-10-CM | POA: Insufficient documentation

## 2017-12-06 DIAGNOSIS — H3530 Unspecified macular degeneration: Secondary | ICD-10-CM | POA: Insufficient documentation

## 2017-12-06 DIAGNOSIS — K219 Gastro-esophageal reflux disease without esophagitis: Secondary | ICD-10-CM | POA: Diagnosis not present

## 2017-12-06 DIAGNOSIS — Z96652 Presence of left artificial knee joint: Secondary | ICD-10-CM | POA: Diagnosis not present

## 2017-12-06 DIAGNOSIS — Z87891 Personal history of nicotine dependence: Secondary | ICD-10-CM | POA: Insufficient documentation

## 2017-12-06 DIAGNOSIS — G629 Polyneuropathy, unspecified: Secondary | ICD-10-CM | POA: Diagnosis not present

## 2017-12-06 DIAGNOSIS — M1711 Unilateral primary osteoarthritis, right knee: Secondary | ICD-10-CM | POA: Diagnosis not present

## 2017-12-06 DIAGNOSIS — Z01812 Encounter for preprocedural laboratory examination: Secondary | ICD-10-CM | POA: Diagnosis not present

## 2017-12-06 HISTORY — DX: Cardiac arrhythmia, unspecified: I49.9

## 2017-12-06 HISTORY — DX: Sleep apnea, unspecified: G47.30

## 2017-12-06 HISTORY — DX: Paroxysmal atrial fibrillation: I48.0

## 2017-12-06 HISTORY — DX: Acute embolism and thrombosis of unspecified deep veins of unspecified lower extremity: I82.409

## 2017-12-06 LAB — CBC WITH DIFFERENTIAL/PLATELET
Basophils Absolute: 0 10*3/uL (ref 0.0–0.1)
Basophils Relative: 0 %
Eosinophils Absolute: 0.2 10*3/uL (ref 0.0–0.7)
Eosinophils Relative: 3 %
HCT: 38.3 % — ABNORMAL LOW (ref 39.0–52.0)
Hemoglobin: 12.7 g/dL — ABNORMAL LOW (ref 13.0–17.0)
Lymphocytes Relative: 39 %
Lymphs Abs: 1.7 10*3/uL (ref 0.7–4.0)
MCH: 30 pg (ref 26.0–34.0)
MCHC: 33.2 g/dL (ref 30.0–36.0)
MCV: 90.3 fL (ref 78.0–100.0)
Monocytes Absolute: 0.4 10*3/uL (ref 0.1–1.0)
Monocytes Relative: 9 %
Neutro Abs: 2.2 10*3/uL (ref 1.7–7.7)
Neutrophils Relative %: 49 %
Platelets: 215 10*3/uL (ref 150–400)
RBC: 4.24 MIL/uL (ref 4.22–5.81)
RDW: 13.4 % (ref 11.5–15.5)
WBC: 4.5 10*3/uL (ref 4.0–10.5)

## 2017-12-06 LAB — COMPREHENSIVE METABOLIC PANEL
ALT: 18 U/L (ref 17–63)
AST: 23 U/L (ref 15–41)
Albumin: 4 g/dL (ref 3.5–5.0)
Alkaline Phosphatase: 65 U/L (ref 38–126)
Anion gap: 7 (ref 5–15)
BUN: 12 mg/dL (ref 6–20)
CO2: 23 mmol/L (ref 22–32)
Calcium: 9.2 mg/dL (ref 8.9–10.3)
Chloride: 107 mmol/L (ref 101–111)
Creatinine, Ser: 0.93 mg/dL (ref 0.61–1.24)
GFR calc Af Amer: >60 mL/min (ref >60)
GFR calc non Af Amer: >60 mL/min (ref >60)
Glucose, Bld: 111 mg/dL — ABNORMAL HIGH (ref 65–99)
Potassium: 4.1 mmol/L (ref 3.5–5.1)
Sodium: 137 mmol/L (ref 135–145)
Total Bilirubin: 1.2 mg/dL (ref 0.3–1.2)
Total Protein: 6.4 g/dL — ABNORMAL LOW (ref 6.5–8.1)

## 2017-12-06 LAB — SURGICAL PCR SCREEN
MRSA, PCR: NEGATIVE
Staphylococcus aureus: NEGATIVE

## 2017-12-06 NOTE — Progress Notes (Addendum)
Anesthesia Chart Review: Patient is a 71 year old male scheduled for right TKA on 12/16/17 by Dr. [name].  History includes former smoker (quit '88), post-operative N/V, hiatal hernia, GERD, BPH, PUD, macular degeneration, peripheral neuropathy, OSA (does not wear CPAP), left TKA 06/17/17, left quadriceps repair 07/01/17,  PAF (new onset afib with RVR 07/23/17), left peroneal vein DVT (post-op) 07/23/17, C5-7 ACDF 12/12/05, C3-4 ACDF 02/27/08, L4-5 posterolateral arthrodesis 03/05/14, bilateral cataract extractions '15, placement of spinal cord stimulator ([employer] Infinion leads/Spectra SCS generator) 09/23/15, appendectomy.  - PCP is Dr. [name]. He is also board certified in cardiology. He saw patient on 11/11/17 and wrote, "Cleared for surgery [from a medical and cardiac standpoint]. No increased risk." He gave permission to stop Eliquis. (Patient reported that he was told to stop 11/29/17. I spoke with staff at Dr. Denita Lung office today. They spoke with him and confirmed that he had told patient that he could stop Eliquis and would defer to Dr. Ronnie Derby when to resume.)  - Cardiologist is Dr. Jenkins Rouge, first seen on 07/23/17 during hospitalization for new onset afib. Last visit with Jory Sims, DNP 10/14/17. Patient had had ED visit 10/09/17 for afib at 102 bpm. His previous instructions had him on diltiazem BID, so she increased it again to TID. He was on Eliquis then. Six month follow-up planned.  Meds include Uroxatral, Xanax, Eliquis (no longer taking), diltiazem, Neurontin, Norco, magnesium oxide, fish oil, Protonix, Probiotic, Flomax.    BP (!) 117/59   Pulse 60   Temp 36.6 C (Oral)   Resp 18   Ht 5\' 8"  (1.727 m)   Wt 169 lb (76.7 kg)   SpO2 96%   BMI 25.70 kg/m   EKG 11/04/17: SB at 54 bpm with PACs with aberrant conduction, Incomplete right BBB. Afib has resolved since 10/09/17.    Echo 07/23/17: Study Conclusions - Left ventricle: The cavity size was normal.  Systolic function was   normal. The estimated ejection fraction was in the range of 55%   to 60%. Wall motion was normal; there were no regional wall   motion abnormalities. Left ventricular diastolic function   parameters were normal. - Left atrium: The atrium was mildly dilated. - Atrial septum: A patent foramen ovale cannot be excluded.  LLE venous U/S 07/23/17: IMPRESSION: Occlusive thrombus within the left peroneal vein.  Arch/Abdominal aortogram (incorrectly titled "Cardiac Catherization") 08/27/05:  1.  Arch aortogram; Type 1 arch without evidence of arch vessel disease. 2.  Right subclavian artery; widely patent. 3.  Abdominal aortogram;     1.  Renal arteries - normal.     2.  Infrarenal abdominal aorta and iliac bifurcation were free of significant disease.  CXR 10/09/17: IMPRESSION: 1. Mild cardiomegaly without edema 2. Streaky atelectasis or small infiltrate at the left base.  CTA Chest 07/23/17: IMPRESSION: 1. No evidence of pulmonary embolus. 2. Coronary artery calcifications are noted suggesting coronary artery disease. Aortic Atherosclerosis (ICD10-I70.0).  Preoperative labs noted. Cr 0.93. Glucose 111. H/H 12.7/38.3. PLT 215.   History of PAF and LLE DVT 07/2017. Was back in [place] on 11/04/17. He had preoperative evaluation by Dr. Cindie Laroche who gave permission to stop Eliquis and cleared him from a medical and cardiac standpoint for surgery. He remains on diltiazem. Echo in August showed normal LVEF and wall motion. If no acute changes then I anticipate that he can proceed as planned.  George Hugh [facility] Short Stay Center/Anesthesiology Phone [phone] 12/09/2017 10:28 AM

## 2017-12-13 MED ORDER — DEXAMETHASONE SODIUM PHOSPHATE 10 MG/ML IJ SOLN
8.0000 mg | Freq: Once | INTRAMUSCULAR | Status: DC
  Filled 2017-12-13: qty 1

## 2017-12-13 MED ORDER — CEFAZOLIN SODIUM-DEXTROSE 2-4 GM/100ML-% IV SOLN: 2.0000 g | INTRAVENOUS | Status: AC

## 2017-12-13 MED ORDER — GABAPENTIN 300 MG PO CAPS
300.0000 mg | ORAL_CAPSULE | Freq: Once | ORAL | Status: DC
  Filled 2017-12-13: qty 1

## 2017-12-13 MED ORDER — BUPIVACAINE LIPOSOME 1.3 % IJ SUSP
20.0000 mL | INTRAMUSCULAR | Status: AC
  Administered 2017-12-16: 20 mL
  Filled 2017-12-13: qty 20

## 2017-12-13 MED ORDER — ACETAMINOPHEN 500 MG PO TABS
1000.0000 mg | ORAL_TABLET | Freq: Once | ORAL | Status: AC
  Administered 2017-12-16: 1000 mg via ORAL
  Filled 2017-12-13: qty 2

## 2017-12-16 ENCOUNTER — Ambulatory Visit (HOSPITAL_COMMUNITY): Payer: Medicare Other | Admitting: Certified Registered"

## 2017-12-16 ENCOUNTER — Observation Stay (HOSPITAL_COMMUNITY)
Admission: RE | Admit: 2017-12-16 | Discharge: 2017-12-17 | Disposition: A | Discharge status: Home / Self Care | Payer: Medicare Other | Source: Ambulatory Visit | Attending: Orthopedic Surgery | Admitting: Orthopedic Surgery

## 2017-12-16 ENCOUNTER — Encounter (HOSPITAL_COMMUNITY)
Admission: RE | Disposition: A | Discharge status: Home / Self Care | Payer: Self-pay | Source: Ambulatory Visit | Attending: Orthopedic Surgery

## 2017-12-16 ENCOUNTER — Other Ambulatory Visit: Payer: Self-pay

## 2017-12-16 ENCOUNTER — Encounter (HOSPITAL_COMMUNITY): Payer: Self-pay

## 2017-12-16 ENCOUNTER — Ambulatory Visit (HOSPITAL_COMMUNITY): Payer: Medicare Other | Admitting: Vascular Surgery

## 2017-12-16 DIAGNOSIS — Z7982 Long term (current) use of aspirin: Secondary | ICD-10-CM | POA: Diagnosis not present

## 2017-12-16 DIAGNOSIS — G4700 Insomnia, unspecified: Secondary | ICD-10-CM | POA: Insufficient documentation

## 2017-12-16 DIAGNOSIS — Z86010 Personal history of colonic polyps: Secondary | ICD-10-CM | POA: Insufficient documentation

## 2017-12-16 DIAGNOSIS — M1711 Unilateral primary osteoarthritis, right knee: Principal | ICD-10-CM | POA: Insufficient documentation

## 2017-12-16 DIAGNOSIS — Z884 Allergy status to anesthetic agent status: Secondary | ICD-10-CM | POA: Diagnosis not present

## 2017-12-16 DIAGNOSIS — G629 Polyneuropathy, unspecified: Secondary | ICD-10-CM | POA: Insufficient documentation

## 2017-12-16 DIAGNOSIS — H3530 Unspecified macular degeneration: Secondary | ICD-10-CM | POA: Insufficient documentation

## 2017-12-16 DIAGNOSIS — K219 Gastro-esophageal reflux disease without esophagitis: Secondary | ICD-10-CM | POA: Diagnosis not present

## 2017-12-16 DIAGNOSIS — Z8719 Personal history of other diseases of the digestive system: Secondary | ICD-10-CM | POA: Insufficient documentation

## 2017-12-16 DIAGNOSIS — Z96652 Presence of left artificial knee joint: Secondary | ICD-10-CM | POA: Diagnosis not present

## 2017-12-16 DIAGNOSIS — Z87891 Personal history of nicotine dependence: Secondary | ICD-10-CM | POA: Insufficient documentation

## 2017-12-16 DIAGNOSIS — Z79899 Other long term (current) drug therapy: Secondary | ICD-10-CM | POA: Diagnosis not present

## 2017-12-16 DIAGNOSIS — I480 Paroxysmal atrial fibrillation: Secondary | ICD-10-CM | POA: Diagnosis not present

## 2017-12-16 DIAGNOSIS — Z885 Allergy status to narcotic agent status: Secondary | ICD-10-CM | POA: Diagnosis not present

## 2017-12-16 DIAGNOSIS — G4730 Sleep apnea, unspecified: Secondary | ICD-10-CM | POA: Diagnosis not present

## 2017-12-16 DIAGNOSIS — Z96659 Presence of unspecified artificial knee joint: Secondary | ICD-10-CM

## 2017-12-16 DIAGNOSIS — N400 Enlarged prostate without lower urinary tract symptoms: Secondary | ICD-10-CM | POA: Insufficient documentation

## 2017-12-16 HISTORY — PX: TOTAL KNEE ARTHROPLASTY: SHX125

## 2017-12-16 LAB — PROTIME-INR
INR: 1.02
Prothrombin Time: 13.4 seconds (ref 11.4–15.2)

## 2017-12-16 SURGERY — ARTHROPLASTY, KNEE, TOTAL
Anesthesia: General | Site: Knee | Laterality: Right

## 2017-12-16 MED ORDER — OXYCODONE HCL 5 MG/5ML PO SOLN: 5.0000 mg | Freq: Once | ORAL | Status: DC | PRN

## 2017-12-16 MED ORDER — ONDANSETRON HCL 4 MG/2ML IJ SOLN
4.0000 mg | Freq: Four times a day (QID) | INTRAMUSCULAR | Status: DC | PRN
Start: 2017-12-16 — End: 2017-12-17

## 2017-12-16 MED ORDER — SCOPOLAMINE 1 MG/3DAYS TD PT72
MEDICATED_PATCH | TRANSDERMAL | Status: DC | PRN
  Administered 2017-12-16: 1 via TRANSDERMAL

## 2017-12-16 MED ORDER — PHENOL 1.4 % MT LIQD
1.0000 | OROMUCOSAL | Status: DC | PRN
  Administered 2017-12-16: 1 via OROMUCOSAL
  Filled 2017-12-16: qty 177

## 2017-12-16 MED ORDER — FENTANYL CITRATE (PF) 100 MCG/2ML IJ SOLN
INTRAMUSCULAR | Status: AC
  Administered 2017-12-16: 50 ug via INTRAVENOUS
  Filled 2017-12-16: qty 2

## 2017-12-16 MED ORDER — LIDOCAINE HCL (CARDIAC) 20 MG/ML IV SOLN
INTRAVENOUS | Status: DC | PRN
  Administered 2017-12-16: 60 mg via INTRAVENOUS

## 2017-12-16 MED ORDER — HYDROCODONE-ACETAMINOPHEN 7.5-325 MG PO TABS
1.0000 | ORAL_TABLET | Freq: Four times a day (QID) | ORAL | Status: DC
  Administered 2017-12-16 – 2017-12-17 (×3): 1 via ORAL
  Filled 2017-12-16 (×3): qty 1

## 2017-12-16 MED ORDER — PROPOFOL 10 MG/ML IV BOLUS
INTRAVENOUS | Status: DC | PRN
  Administered 2017-12-16: 100 mg via INTRAVENOUS

## 2017-12-16 MED ORDER — METOCLOPRAMIDE HCL 5 MG PO TABS: 5.0000 mg | ORAL_TABLET | Freq: Three times a day (TID) | ORAL | Status: DC | PRN

## 2017-12-16 MED ORDER — PHENYLEPHRINE HCL 10 MG/ML IJ SOLN
INTRAVENOUS | Status: DC | PRN
  Administered 2017-12-16: 20 ug/min via INTRAVENOUS

## 2017-12-16 MED ORDER — CELECOXIB 200 MG PO CAPS
200.0000 mg | ORAL_CAPSULE | Freq: Two times a day (BID) | ORAL | Status: DC
  Administered 2017-12-16 – 2017-12-17 (×3): 200 mg via ORAL
  Filled 2017-12-16 (×3): qty 1

## 2017-12-16 MED ORDER — FLEET ENEMA 7-19 GM/118ML RE ENEM: 1.0000 | ENEMA | Freq: Once | RECTAL | Status: DC | PRN

## 2017-12-16 MED ORDER — LACTATED RINGERS IV SOLN
INTRAVENOUS | Status: DC
  Administered 2017-12-16 (×3): via INTRAVENOUS

## 2017-12-16 MED ORDER — METOCLOPRAMIDE HCL 5 MG/ML IJ SOLN
5.0000 mg | Freq: Three times a day (TID) | INTRAMUSCULAR | Status: DC | PRN
Start: 2017-12-16 — End: 2017-12-17

## 2017-12-16 MED ORDER — HYDROMORPHONE HCL 1 MG/ML IJ SOLN: 1.0000 mg | INTRAMUSCULAR | Status: DC | PRN

## 2017-12-16 MED ORDER — MENTHOL 3 MG MT LOZG
1.0000 | LOZENGE | OROMUCOSAL | Status: DC | PRN
  Filled 2017-12-16: qty 9

## 2017-12-16 MED ORDER — CEFAZOLIN SODIUM-DEXTROSE 2-4 GM/100ML-% IV SOLN
2.0000 g | INTRAVENOUS | Status: AC
  Administered 2017-12-16: 2 g via INTRAVENOUS

## 2017-12-16 MED ORDER — BUPIVACAINE-EPINEPHRINE 0.5% -1:200000 IJ SOLN
INTRAMUSCULAR | Status: DC | PRN
  Administered 2017-12-16: 30 mL

## 2017-12-16 MED ORDER — FENTANYL CITRATE (PF) 100 MCG/2ML IJ SOLN
50.0000 ug | Freq: Once | INTRAMUSCULAR | Status: AC
  Administered 2017-12-16: 50 ug via INTRAVENOUS

## 2017-12-16 MED ORDER — METHOCARBAMOL 1000 MG/10ML IJ SOLN: 500.0000 mg | Freq: Four times a day (QID) | INTRAVENOUS | Status: DC | PRN

## 2017-12-16 MED ORDER — DOCUSATE SODIUM 100 MG PO CAPS
100.0000 mg | ORAL_CAPSULE | Freq: Two times a day (BID) | ORAL | Status: DC
  Administered 2017-12-16 – 2017-12-17 (×3): 100 mg via ORAL
  Filled 2017-12-16 (×3): qty 1

## 2017-12-16 MED ORDER — ACETAMINOPHEN 650 MG RE SUPP: 650.0000 mg | RECTAL | Status: DC | PRN

## 2017-12-16 MED ORDER — PROPOFOL 10 MG/ML IV BOLUS
INTRAVENOUS | Status: AC
Start: 2017-12-16 — End: ?
  Filled 2017-12-16: qty 20

## 2017-12-16 MED ORDER — TRANEXAMIC ACID 1000 MG/10ML IV SOLN
1000.0000 mg | Freq: Once | INTRAVENOUS | Status: DC
  Filled 2017-12-16: qty 10

## 2017-12-16 MED ORDER — TRANEXAMIC ACID 1000 MG/10ML IV SOLN
INTRAVENOUS | Status: AC | PRN
  Administered 2017-12-16: 2000 mg via INTRAVENOUS

## 2017-12-16 MED ORDER — BISACODYL 5 MG PO TBEC: 5.0000 mg | DELAYED_RELEASE_TABLET | Freq: Every day | ORAL | Status: DC | PRN

## 2017-12-16 MED ORDER — ROCURONIUM BROMIDE 100 MG/10ML IV SOLN
INTRAVENOUS | Status: DC | PRN
  Administered 2017-12-16: 50 mg via INTRAVENOUS

## 2017-12-16 MED ORDER — SENNOSIDES-DOCUSATE SODIUM 8.6-50 MG PO TABS: 1.0000 | ORAL_TABLET | Freq: Every evening | ORAL | Status: DC | PRN

## 2017-12-16 MED ORDER — METHOCARBAMOL 500 MG PO TABS
500.0000 mg | ORAL_TABLET | Freq: Four times a day (QID) | ORAL | Status: DC | PRN
  Administered 2017-12-17: 500 mg via ORAL
  Filled 2017-12-16: qty 1

## 2017-12-16 MED ORDER — ACETAMINOPHEN 325 MG PO TABS: 650.0000 mg | ORAL_TABLET | ORAL | Status: DC | PRN

## 2017-12-16 MED ORDER — CEFAZOLIN SODIUM-DEXTROSE 2-4 GM/100ML-% IV SOLN
INTRAVENOUS | Status: AC
  Filled 2017-12-16: qty 100

## 2017-12-16 MED ORDER — OXYCODONE HCL 5 MG PO TABS
5.0000 mg | ORAL_TABLET | ORAL | Status: DC | PRN
  Administered 2017-12-17: 5 mg via ORAL
  Filled 2017-12-16: qty 1

## 2017-12-16 MED ORDER — HYDROMORPHONE HCL 1 MG/ML IJ SOLN
INTRAMUSCULAR | Status: AC
  Filled 2017-12-16: qty 1

## 2017-12-16 MED ORDER — CHLORHEXIDINE GLUCONATE 4 % EX LIQD: 60.0000 mL | Freq: Once | CUTANEOUS | Status: DC

## 2017-12-16 MED ORDER — 0.9 % SODIUM CHLORIDE (POUR BTL) OPTIME
TOPICAL | Status: DC | PRN
  Administered 2017-12-16: 1000 mL

## 2017-12-16 MED ORDER — ROPIVACAINE HCL 5 MG/ML IJ SOLN
INTRAMUSCULAR | Status: DC | PRN
  Administered 2017-12-16: 20 mL via PERINEURAL

## 2017-12-16 MED ORDER — ALFUZOSIN HCL ER 10 MG PO TB24
10.0000 mg | ORAL_TABLET | Freq: Every day | ORAL | Status: DC
  Administered 2017-12-17: 10 mg via ORAL
  Filled 2017-12-16: qty 1

## 2017-12-16 MED ORDER — SODIUM CHLORIDE 0.9 % IJ SOLN
INTRAMUSCULAR | Status: DC | PRN
  Administered 2017-12-16: 20 mL

## 2017-12-16 MED ORDER — ASPIRIN 325 MG PO TBEC: 325.0000 mg | DELAYED_RELEASE_TABLET | Freq: Two times a day (BID) | ORAL | 0 refills | Status: DC

## 2017-12-16 MED ORDER — MIDAZOLAM HCL 2 MG/2ML IJ SOLN
INTRAMUSCULAR | Status: AC
  Filled 2017-12-16: qty 2

## 2017-12-16 MED ORDER — TRANEXAMIC ACID 1000 MG/10ML IV SOLN
2000.0000 mg | Freq: Once | INTRAVENOUS | Status: DC
  Filled 2017-12-16: qty 20

## 2017-12-16 MED ORDER — HYDROMORPHONE HCL 1 MG/ML IJ SOLN
0.2500 mg | INTRAMUSCULAR | Status: DC | PRN
  Administered 2017-12-16: 0.25 mg via INTRAVENOUS

## 2017-12-16 MED ORDER — CEFAZOLIN SODIUM-DEXTROSE 1-4 GM/50ML-% IV SOLN
1.0000 g | Freq: Four times a day (QID) | INTRAVENOUS | Status: AC
  Administered 2017-12-16 (×2): 1 g via INTRAVENOUS
  Filled 2017-12-16 (×2): qty 50

## 2017-12-16 MED ORDER — EPHEDRINE SULFATE 50 MG/ML IJ SOLN
INTRAMUSCULAR | Status: DC | PRN
  Administered 2017-12-16: 10 mg via INTRAVENOUS
  Administered 2017-12-16: 5 mg via INTRAVENOUS
  Administered 2017-12-16 (×2): 10 mg via INTRAVENOUS
  Administered 2017-12-16: 5 mg via INTRAVENOUS
  Administered 2017-12-16: 10 mg via INTRAVENOUS

## 2017-12-16 MED ORDER — SUGAMMADEX SODIUM 200 MG/2ML IV SOLN
INTRAVENOUS | Status: DC | PRN
  Administered 2017-12-16: 150 mg via INTRAVENOUS

## 2017-12-16 MED ORDER — ONDANSETRON HCL 4 MG/2ML IJ SOLN: 4.0000 mg | Freq: Four times a day (QID) | INTRAMUSCULAR | Status: DC | PRN

## 2017-12-16 MED ORDER — ZOLPIDEM TARTRATE 5 MG PO TABS: 5.0000 mg | ORAL_TABLET | Freq: Every evening | ORAL | Status: DC | PRN

## 2017-12-16 MED ORDER — FENTANYL CITRATE (PF) 100 MCG/2ML IJ SOLN
INTRAMUSCULAR | Status: DC | PRN
  Administered 2017-12-16: 100 ug via INTRAVENOUS
  Administered 2017-12-16: 50 ug via INTRAVENOUS

## 2017-12-16 MED ORDER — SODIUM CHLORIDE 0.9 % IR SOLN
Status: DC | PRN
  Administered 2017-12-16: 3000 mL

## 2017-12-16 MED ORDER — FENTANYL CITRATE (PF) 250 MCG/5ML IJ SOLN
INTRAMUSCULAR | Status: AC
  Filled 2017-12-16: qty 5

## 2017-12-16 MED ORDER — BACLOFEN 10 MG PO TABS: 10.0000 mg | ORAL_TABLET | Freq: Three times a day (TID) | ORAL | 0 refills | Status: DC

## 2017-12-16 MED ORDER — BUPIVACAINE-EPINEPHRINE (PF) 0.5% -1:200000 IJ SOLN
INTRAMUSCULAR | Status: AC
  Filled 2017-12-16: qty 30

## 2017-12-16 MED ORDER — TAMSULOSIN HCL 0.4 MG PO CAPS
0.4000 mg | ORAL_CAPSULE | Freq: Every day | ORAL | Status: DC
  Administered 2017-12-17: 0.4 mg via ORAL
  Filled 2017-12-16: qty 1

## 2017-12-16 MED ORDER — ALUM & MAG HYDROXIDE-SIMETH 200-200-20 MG/5ML PO SUSP: 30.0000 mL | ORAL | Status: DC | PRN

## 2017-12-16 MED ORDER — GABAPENTIN 400 MG PO CAPS
800.0000 mg | ORAL_CAPSULE | Freq: Every day | ORAL | Status: DC
  Administered 2017-12-17: 800 mg via ORAL
  Filled 2017-12-16: qty 2

## 2017-12-16 MED ORDER — OXYCODONE HCL 5 MG PO TABS: 5.0000 mg | ORAL_TABLET | Freq: Once | ORAL | Status: DC | PRN

## 2017-12-16 MED ORDER — OXYCODONE HCL 10 MG PO TABS: 10.0000 mg | ORAL_TABLET | ORAL | 0 refills | Status: DC | PRN

## 2017-12-16 MED ORDER — ASPIRIN EC 325 MG PO TBEC
325.0000 mg | DELAYED_RELEASE_TABLET | Freq: Two times a day (BID) | ORAL | Status: DC
  Administered 2017-12-16 – 2017-12-17 (×3): 325 mg via ORAL
  Filled 2017-12-16 (×3): qty 1

## 2017-12-16 MED ORDER — DIPHENHYDRAMINE HCL 12.5 MG/5ML PO ELIX: 12.5000 mg | ORAL_SOLUTION | ORAL | Status: DC | PRN

## 2017-12-16 MED ORDER — DEXAMETHASONE SODIUM PHOSPHATE 10 MG/ML IJ SOLN
10.0000 mg | Freq: Once | INTRAMUSCULAR | Status: AC
  Administered 2017-12-17: 10 mg via INTRAVENOUS
  Filled 2017-12-16: qty 1

## 2017-12-16 MED ORDER — ALPRAZOLAM 0.5 MG PO TABS
1.0000 mg | ORAL_TABLET | Freq: Every day | ORAL | Status: DC
  Administered 2017-12-16: 1 mg via ORAL
  Filled 2017-12-16: qty 2

## 2017-12-16 MED ORDER — DILTIAZEM HCL 60 MG PO TABS
60.0000 mg | ORAL_TABLET | Freq: Three times a day (TID) | ORAL | Status: DC
  Administered 2017-12-16: 60 mg via ORAL
  Filled 2017-12-16 (×4): qty 1

## 2017-12-16 MED ORDER — ONDANSETRON HCL 4 MG PO TABS: 4.0000 mg | ORAL_TABLET | Freq: Four times a day (QID) | ORAL | Status: DC | PRN

## 2017-12-16 MED ORDER — ONDANSETRON HCL 4 MG/2ML IJ SOLN
INTRAMUSCULAR | Status: DC | PRN
  Administered 2017-12-16: 4 mg via INTRAVENOUS

## 2017-12-16 MED ORDER — DEXAMETHASONE SODIUM PHOSPHATE 10 MG/ML IJ SOLN
INTRAMUSCULAR | Status: DC | PRN
  Administered 2017-12-16: 10 mg via INTRAVENOUS

## 2017-12-16 MED ORDER — PANTOPRAZOLE SODIUM 40 MG PO TBEC: 80.0000 mg | DELAYED_RELEASE_TABLET | Freq: Every day | ORAL | Status: DC

## 2017-12-16 SURGICAL SUPPLY — 60 items
BANDAGE ACE 6X5 VEL STRL LF (GAUZE/BANDAGES/DRESSINGS) ×2 IMPLANT
BANDAGE ELASTIC 6 VELCRO ST LF (GAUZE/BANDAGES/DRESSINGS) ×2 IMPLANT
BANDAGE ESMARK 6X9 LF (GAUZE/BANDAGES/DRESSINGS) ×1 IMPLANT
BLADE SAGITTAL 13X1.27X60 (BLADE) ×2 IMPLANT
BLADE SAW SGTL 83.5X18.5 (BLADE) ×2 IMPLANT
BLADE SURG 10 STRL SS (BLADE) ×2 IMPLANT
BNDG ESMARK 6X9 LF (GAUZE/BANDAGES/DRESSINGS) ×2
BOWL SMART MIX CTS (DISPOSABLE) ×2 IMPLANT
CAPT KNEE TOTAL 3 ×2 IMPLANT
CEMENT BONE SIMPLEX SPEEDSET (Cement) ×4 IMPLANT
CLSR STERI-STRIP ANTIMIC 1/2X4 (GAUZE/BANDAGES/DRESSINGS) ×2 IMPLANT
COVER SURGICAL LIGHT HANDLE (MISCELLANEOUS) ×2 IMPLANT
CUFF TOURNIQUET SINGLE 34IN LL (TOURNIQUET CUFF) ×2 IMPLANT
DRAPE EXTREMITY T 121X128X90 (DRAPE) ×2 IMPLANT
DRAPE HALF SHEET 40X57 (DRAPES) ×2 IMPLANT
DRAPE INCISE IOBAN 66X45 STRL (DRAPES) ×4 IMPLANT
DRAPE U-SHAPE 47X51 STRL (DRAPES) ×2 IMPLANT
DRSG AQUACEL AG ADV 3.5X10 (GAUZE/BANDAGES/DRESSINGS) ×2 IMPLANT
DURAPREP 26ML APPLICATOR (WOUND CARE) ×4 IMPLANT
ELECT REM PT RETURN 9FT ADLT (ELECTROSURGICAL) ×2
ELECTRODE REM PT RTRN 9FT ADLT (ELECTROSURGICAL) ×1 IMPLANT
FILTER STRAW FLUID ASPIR (MISCELLANEOUS) IMPLANT
GLOVE BIOGEL M 7.0 STRL (GLOVE) IMPLANT
GLOVE BIOGEL PI IND STRL 7.5 (GLOVE) IMPLANT
GLOVE BIOGEL PI IND STRL 8.5 (GLOVE) ×1 IMPLANT
GLOVE BIOGEL PI INDICATOR 7.5 (GLOVE)
GLOVE BIOGEL PI INDICATOR 8.5 (GLOVE) ×1
GLOVE SURG ORTHO 8.0 STRL STRW (GLOVE) ×4 IMPLANT
GOWN STRL REUS W/ TWL LRG LVL3 (GOWN DISPOSABLE) ×1 IMPLANT
GOWN STRL REUS W/ TWL XL LVL3 (GOWN DISPOSABLE) ×2 IMPLANT
GOWN STRL REUS W/TWL 2XL LVL3 (GOWN DISPOSABLE) ×2 IMPLANT
GOWN STRL REUS W/TWL LRG LVL3 (GOWN DISPOSABLE) ×1
GOWN STRL REUS W/TWL XL LVL3 (GOWN DISPOSABLE) ×2
HANDPIECE INTERPULSE COAX TIP (DISPOSABLE) ×1
HOOD PEEL AWAY FACE SHEILD DIS (HOOD) ×6 IMPLANT
KIT BASIN OR (CUSTOM PROCEDURE TRAY) ×2 IMPLANT
KIT ROOM TURNOVER OR (KITS) ×2 IMPLANT
KNEE CAPITATED TOTAL 3 ×1 IMPLANT
MANIFOLD NEPTUNE II (INSTRUMENTS) ×2 IMPLANT
NEEDLE 18GX1X1/2 (RX/OR ONLY) (NEEDLE) IMPLANT
NEEDLE 22X1 1/2 (OR ONLY) (NEEDLE) ×4 IMPLANT
NS IRRIG 1000ML POUR BTL (IV SOLUTION) ×2 IMPLANT
PACK TOTAL JOINT (CUSTOM PROCEDURE TRAY) ×2 IMPLANT
PAD ARMBOARD 7.5X6 YLW CONV (MISCELLANEOUS) ×4 IMPLANT
SET HNDPC FAN SPRY TIP SCT (DISPOSABLE) ×1 IMPLANT
STRIP CLOSURE SKIN 1/2X4 (GAUZE/BANDAGES/DRESSINGS) ×2 IMPLANT
SUCTION FRAZIER HANDLE 10FR (MISCELLANEOUS)
SUCTION TUBE FRAZIER 10FR DISP (MISCELLANEOUS) IMPLANT
SUT MNCRL AB 3-0 PS2 18 (SUTURE) ×2 IMPLANT
SUT VIC AB 0 CTB1 27 (SUTURE) ×4 IMPLANT
SUT VIC AB 1 CT1 27 (SUTURE) ×2
SUT VIC AB 1 CT1 27XBRD ANBCTR (SUTURE) ×2 IMPLANT
SUT VIC AB 2-0 CT1 27 (SUTURE) ×2
SUT VIC AB 2-0 CT1 TAPERPNT 27 (SUTURE) ×2 IMPLANT
SYR 20CC LL (SYRINGE) ×4 IMPLANT
SYR TB 1ML LUER SLIP (SYRINGE) IMPLANT
TOWEL OR 17X24 6PK STRL BLUE (TOWEL DISPOSABLE) ×2 IMPLANT
TOWEL OR 17X26 10 PK STRL BLUE (TOWEL DISPOSABLE) ×2 IMPLANT
TRAY CATH 16FR W/PLASTIC CATH (SET/KITS/TRAYS/PACK) IMPLANT
WRAP KNEE MAXI GEL POST OP (GAUZE/BANDAGES/DRESSINGS) ×2 IMPLANT

## 2017-12-16 NOTE — Anesthesia Procedure Notes (Signed)
Anesthesia Regional Block: Adductor canal block   Pre-Anesthetic Checklist: ,, timeout performed, Correct Patient, Correct Site, Correct Laterality, Correct Procedure, Correct Position, site marked, Risks and benefits discussed,  Surgical consent,  Pre-op evaluation,  At surgeon's request and post-op pain management  Laterality: Right  Prep: chloraprep       Needles:  Injection technique: Single-shot  Needle Type: Echogenic Needle     Needle Length: 9cm  Needle Gauge: 21     Additional Needles:   Narrative:  Start time: 12/16/2017 8:01 AM End time: 12/16/2017 8:06 AM Injection made incrementally with aspirations every 5 mL.  Performed by: Personally  Anesthesiologist: Albertha Ghee, MD  Additional Notes: Pt tolerated the procedure well.

## 2017-12-16 NOTE — Anesthesia Procedure Notes (Signed)
Procedure Name: Intubation Date/Time: 12/16/2017 8:24 AM Performed by: Gaylene Brooks, CRNA Pre-anesthesia Checklist: Patient identified, Emergency Drugs available, Suction available and Patient being monitored Patient Re-evaluated:Patient Re-evaluated prior to induction Oxygen Delivery Method: Circle System Utilized Preoxygenation: Pre-oxygenation with 100% oxygen Induction Type: IV induction Ventilation: Mask ventilation without difficulty and Oral airway inserted - appropriate to patient size Laryngoscope Size: Sabra Heck and 2 Grade View: Grade I Tube type: Oral Tube size: 7.5 mm Number of attempts: 1 Airway Equipment and Method: Stylet and Oral airway Placement Confirmation: ETT inserted through vocal cords under direct vision,  positive ETCO2 and breath sounds checked- equal and bilateral Secured at: 22 cm Tube secured with: Tape Dental Injury: Teeth and Oropharynx as per pre-operative assessment

## 2017-12-16 NOTE — Progress Notes (Signed)
Orthopedic Tech Progress Note Patient Details:  Christopher [name] [birth year] [id number]  CPM Right Knee CPM Right Knee: On Right Knee Flexion (Degrees): 90 Right Knee Extension (Degrees): 0 Additional Comments: trapeze bar patient helper  Post Interventions Patient Tolerated: Well Instructions Provided: Care of device  Hildred Priest 12/16/2017, 11:15 AM Viewed order from doctor's order list

## 2017-12-16 NOTE — Evaluation (Signed)
Physical Therapy Evaluation Patient Details Name: Christopher [name] MRN: [id number] DOB: [birth year] Today's Date: 12/16/2017   History of Present Illness  Pt is a 71 y/o male s/p elective R TKA. PMH includes a fib, DVT, Peripheral neuropathy, s/p ACDF, and s/p L TKA.   Clinical Impression  Pt is s/p surgery above with deficits below. PTA, pt was independent with functional mobility. Upon eval, pt presenting with post op pain and weakness, and slightly decreased sensation in RLE. Distance limited to chair this session secondary to decreased sensation and pain. Required min to min guard assist for mobility with RW. Reports wife will be able to assist as needed upon d/c home and has all necessary equipment. Follow up recommendations per MD arrangements. Will continue to follow acutely to maximize functional mobility independence and safety.     Follow Up Recommendations DC plan and follow up therapy as arranged by surgeon;Supervision for mobility/OOB    Equipment Recommendations  None recommended by PT    Recommendations for Other Services       Precautions / Restrictions Precautions Precautions: Knee Precaution Booklet Issued: Yes (comment) Precaution Comments: Reviewed supine ther ex with pt.  Restrictions Weight Bearing Restrictions: Yes RLE Weight Bearing: Weight bearing as tolerated      Mobility  Bed Mobility Overal bed mobility: Needs Assistance Bed Mobility: Supine to Sit     Supine to sit: Min assist     General bed mobility comments: Min A for RLE management.   Transfers Overall transfer level: Needs assistance Equipment used: Rolling walker (2 wheeled) Transfers: Sit to/from Stand Sit to Stand: Min assist         General transfer comment: Min A for lift assist and steadying. Verbal cues for safe hand placement.   Ambulation/Gait Ambulation/Gait assistance: Min guard Ambulation Distance (Feet): 5 Feet Assistive device: Rolling walker (2 wheeled) Gait  Pattern/deviations: Step-to pattern;Decreased step length - right;Decreased step length - left;Decreased weight shift to right;Antalgic Gait velocity: Decreased  Gait velocity interpretation: Below normal speed for age/gender General Gait Details: Slow, antalgic gait. Slightly unsteady and pt reports decreased sensation in RLE; distance limited to chair. Verbal cues for sequencing using RW.   Stairs            Wheelchair Mobility    Modified Rankin (Stroke Patients Only)       Balance Overall balance assessment: Needs assistance Sitting-balance support: No upper extremity supported;Feet supported Sitting balance-Leahy Scale: Good     Standing balance support: Bilateral upper extremity supported;During functional activity Standing balance-Leahy Scale: Poor Standing balance comment: Reliant on BUE support                              Pertinent Vitals/Pain Pain Assessment: 0-10 Pain Score: 8  Pain Location: R knee  Pain Descriptors / Indicators: Aching;Operative site guarding;Sore Pain Intervention(s): Limited activity within patient's tolerance;Monitored during session;Repositioned    Home Living Family/patient expects to be discharged to:: Private residence Living Arrangements: Spouse/significant other Available Help at Discharge: Family;Available 24 hours/day Type of Home: House Home Access: Ramped entrance     Home Layout: One level Home Equipment: Bedside commode;Crutches;Cane - single point;Walker - 4 wheels;Walker - 2 wheels;Hospital bed      Prior Function Level of Independence: Independent               Hand Dominance   Dominant Hand: Right    Extremity/Trunk Assessment   Upper Extremity Assessment Upper  Extremity Assessment: Defer to OT evaluation    Lower Extremity Assessment Lower Extremity Assessment: RLE deficits/detail RLE Deficits / Details: Reports decreased sensation in RLE below knee, however, able to feel PT touching  RLE. Deficits consistent with post op pain and weakness. Able to perform ther ex below.     Cervical / Trunk Assessment Cervical / Trunk Assessment: Normal  Communication   Communication: No difficulties  Cognition Arousal/Alertness: Awake/alert Behavior During Therapy: WFL for tasks assessed/performed Overall Cognitive Status: Within Functional Limits for tasks assessed                                        General Comments General comments (skin integrity, edema, etc.): Pt's wife present during session.     Exercises Total Joint Exercises Ankle Circles/Pumps: AROM;Both;20 reps Quad Sets: AROM;Right;10 reps Towel Squeeze: AROM;Both;10 reps Heel Slides: AROM;Right;10 reps   Assessment/Plan    PT Assessment Patient needs continued PT services  PT Problem List Decreased strength;Decreased range of motion;Decreased balance;Decreased mobility;Decreased knowledge of use of DME;Pain;Impaired sensation       PT Treatment Interventions DME instruction;Gait training;Functional mobility training;Therapeutic activities;Therapeutic exercise;Balance training;Neuromuscular re-education;Patient/family education    PT Goals (Current goals can be found in the Care Plan section)  Acute Rehab PT Goals Patient Stated Goal: to get better  PT Goal Formulation: With patient Time For Goal Achievement: 12/23/17 Potential to Achieve Goals: Good    Frequency 7X/week   Barriers to discharge        Co-evaluation               AM-PAC PT "6 Clicks" Daily Activity  Outcome Measure Difficulty turning over in bed (including adjusting bedclothes, sheets and blankets)?: A Little Difficulty moving from lying on back to sitting on the side of the bed? : Unable Difficulty sitting down on and standing up from a chair with arms (e.g., wheelchair, bedside commode, etc,.)?: Unable Help needed moving to and from a bed to chair (including a wheelchair)?: A Little Help needed walking in  hospital room?: A Little Help needed climbing 3-5 steps with a railing? : A Lot 6 Click Score: 13    End of Session Equipment Utilized During Treatment: Gait belt Activity Tolerance: Patient tolerated treatment well Patient left: in chair;with call bell/phone within reach;with family/visitor present Nurse Communication: Mobility status PT Visit Diagnosis: Unsteadiness on feet (R26.81);Other abnormalities of gait and mobility (R26.89);Pain Pain - Right/Left: Right Pain - part of body: Knee    Time: 2841-3244 PT Time Calculation (min) (ACUTE ONLY): 30 min   Charges:   PT Evaluation $PT Eval Low Complexity: 1 Low PT Treatments $Therapeutic Exercise: 8-22 mins   PT G Codes:   PT G-Codes **NOT FOR INPATIENT CLASS** Functional Assessment Tool Used: AM-PAC 6 Clicks Basic Mobility;Clinical judgement Functional Limitation: Mobility: Walking and moving around Mobility: Walking and Moving Around Current Status (W1027): At least 40 percent but less than 60 percent impaired, limited or restricted Mobility: Walking and Moving Around Goal Status [id number]): At least 1 percent but less than 20 percent impaired, limited or restricted    Leighton Ruff, PT, DPT  Acute Rehabilitation Services  Pager: [place] 12/16/2017, 4:58 PM

## 2017-12-16 NOTE — H&P (Signed)
Christopher [name] MRN:  [id number] DOB/SEX:  May 06, 1946/male  CHIEF COMPLAINT:  Painful right Knee  HISTORY: Patient is a 71 y.o. male presented with a history of pain in the right knee. Onset of symptoms was gradual starting a few years ago with gradually worsening course since that time. Patient has been treated conservatively with over-the-counter NSAIDs and activity modification. Patient currently rates pain in the knee at 10 out of 10 with activity. There is pain at night.  PAST MEDICAL HISTORY: Patient Active Problem List   Diagnosis Date Noted  . Atrial fibrillation with RVR ([place]) 07/23/2017  . History of gastric ulcer   . Chronic back pain   . BPH (benign prostatic hyperplasia)   . 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 Spondylolisthesis of lumbar region 03/05/2014  . Barrett esophagus 02/24/2013  . Personal history of colonic polyps 02/24/2013  . Epigastric pain 01/06/2013  . Encounter for screening colonoscopy 01/06/2013  . Pain in joint, shoulder region 07/09/2012  . Muscle weakness (generalized) 07/09/2012   Past Medical History:  Diagnosis Date  . A-fib ([place])   . Arthritis    "all over my body" (06/17/2017)  . BPH (benign prostatic hyperplasia)    takes Proscar daily  . Chronic back pain    DDD; "upper and lower" (06/17/2017)  . Constipation    takes Colace daily  . DDD (degenerative disc disease), cervical   . DVT (deep venous thrombosis) ([facility])    left peroneal vein 07/2017 (in setting of recent left TKA)  . Dysrhythmia    history of Atrial fibrillation, no long has this  . Enlarged prostate   . GERD (gastroesophageal reflux disease)    takes Nexium daily  . H/O hiatal hernia   . History of colon polyps    benign  . History of gastric ulcer   . Insomnia    takes Xanax nightly  . Joint capsule tear    left knee  . Joint pain   . Joint swelling   . Macular degeneration   . Muscle spasm of back    takes Valium daily as needed  . PAF (paroxysmal atrial  fibrillation) ([place])   . Peripheral neuropathy    takes Gabapentin daily  . PONV (postoperative nausea and vomiting)   . Sleep apnea    pt does not wear a cpap  . Urinary hesitancy   . Weakness    numbness and tingling   Past Surgical History:  Procedure Laterality Date  . ANTERIOR CERVICAL DECOMP/DISCECTOMY FUSION  ~ 1991   "took bone from hip"  . ANTERIOR CERVICAL DECOMP/DISCECTOMY FUSION  X 2   "put plate in"  . ANTERIOR CERVICAL DISCECTOMY  <11/2005    5-6 and 4-5./notes 05/02/2011  . APPENDECTOMY    . BACK SURGERY    . CARDIAC CATHETERIZATION    . CARPAL TUNNEL RELEASE Bilateral 10/2005 - 11/2005   right-left Archie Endo 05/02/2011  . CATARACT EXTRACTION W/PHACO Right 05/24/2014   Procedure: CATARACT EXTRACTION PHACO AND INTRAOCULAR LENS PLACEMENT (IOC);  Surgeon: [name], MD;  Location: [facility];  Service: Ophthalmology;  Laterality: Right;  CDE 6.74  . CATARACT EXTRACTION W/PHACO Left 06/17/2014   Procedure: CATARACT EXTRACTION PHACO AND INTRAOCULAR LENS PLACEMENT (IOC);  Surgeon: [name], MD;  Location: [facility];  Service: Ophthalmology;  Laterality: Left;  CDE:4.65  . COLONOSCOPY WITH ESOPHAGOGASTRODUODENOSCOPY (EGD) N/A 01/22/2013   Procedure: COLONOSCOPY WITH ESOPHAGOGASTRODUODENOSCOPY (EGD);  Surgeon: [name], MD;  Location: [facility];  Service: Endoscopy;  Laterality: N/A;  140  . EYE SURGERY Bilateral    "cleaned film w/laser; since my 1st cataract OR"  . JOINT REPLACEMENT    . POSTERIOR LUMBAR FUSION     "put a plate in"  . QUADRICEPS TENDON REPAIR Left 07/01/2017  . QUADRICEPS TENDON REPAIR Left 07/01/2017   Procedure: REPAIR QUADRICEP TENDON;  Surgeon: [name], MD;  Location: [place];  Service: Orthopedics;  Laterality: Left;  . SHOULDER ARTHROSCOPY Bilateral   . SPINAL CORD STIMULATOR INSERTION N/A 09/23/2015   Procedure: LUMBAR SPINAL CORD STIMULATOR INSERTION;  Surgeon: [name], MD;  Location: [place] [facility];  Service: Neurosurgery;  Laterality: N/A;   LUMBAR SPINAL CORD STIMULATOR INSERTION  . TOTAL KNEE ARTHROPLASTY Left 06/17/2017  . TOTAL KNEE ARTHROPLASTY Left 06/17/2017   Procedure: TOTAL KNEE ARTHROPLASTY;  Surgeon: [name], MD;  Location: [place];  Service: Orthopedics;  Laterality: Left;     MEDICATIONS:   Medications Prior to Admission  Medication Sig Dispense Refill Last Dose  . alfuzosin (UROXATRAL) 10 MG 24 hr tablet Take 10 mg by mouth daily.   12/16/2017 at 0500  . ALPRAZolam (XANAX) 1 MG tablet Take 1 mg by mouth at bedtime.  0 12/15/2017 at Unknown time  . apixaban (ELIQUIS) 5 MG TABS tablet Take 2 tablets (10 mg total) by mouth 2 (two) times daily. (Patient taking differently: Take 5 mg by mouth 2 (two) times daily. ) 14 tablet 0 12/06/2017  . Cholecalciferol (VITAMIN D3) 2000 units capsule Take 2,000 Units by mouth daily.   12/09/2017  . diltiazem (CARDIZEM) 60 MG tablet Take 1 tablet (60 mg total) by mouth 3 (three) times daily. 90 tablet 1 12/16/2017 at 0500  . docusate sodium (COLACE) 100 MG capsule Take 200 mg by mouth daily.   12/16/2017 at 0500  . gabapentin (NEURONTIN) 400 MG capsule Take 800 mg by mouth daily.   2 12/16/2017 at 0500  . HYDROcodone-acetaminophen (NORCO) 10-325 MG tablet Take 1 tablet by mouth 3 (three) times daily as needed for moderate pain.   Past Week at Unknown time  . magnesium oxide (MAG-OX) 400 MG tablet Take 200 mg by mouth daily.   12/16/2017 at 0500  . naproxen (NAPROSYN) 500 MG tablet Take 500 mg by mouth 2 (two) times daily.  5 12/09/2017  . Omega-3 Fatty Acids (FISH OIL) 1000 MG CAPS Take 1,000 mg by mouth daily.    12/09/2017  . pantoprazole (PROTONIX) 40 MG tablet Take 2 tablets (80 mg total) by mouth daily at 12 noon. (Patient taking differently: Take 40 mg by mouth daily at 12 noon. ) 30 tablet 4 12/16/2017 at 0500  . Probiotic CAPS Take 1 capsule by mouth daily.   12/09/2017  . tamsulosin (FLOMAX) 0.4 MG CAPS capsule Take 0.4 mg by mouth daily.   12/16/2017 at 0500  . vitamin B-12  (CYANOCOBALAMIN) 1000 MCG tablet Take 1,000 mcg by mouth daily.   12/09/2017  . Magnesium 200 MG TABS Take 1 tablet (200 mg total) by mouth daily. (Patient not taking: Reported on 11/29/2017) 30 each 11 Not Taking at Unknown time    ALLERGIES:   Allergies  Allergen Reactions  . Morphine And Related Nausea And Vomiting  . Propofol Nausea And Vomiting    REVIEW OF SYSTEMS:  A comprehensive review of systems was negative except for: Musculoskeletal: positive for arthralgias and bone pain   FAMILY HISTORY:  History reviewed. No pertinent family history.  SOCIAL HISTORY:   Social History   Tobacco  Use  . Smoking status: Former Smoker    Packs/day: 1.00    Years: 30.00    Pack years: 30.00    Types: Cigarettes    Last attempt to quit: 01/14/1987    Years since quitting: 30.9  . Smokeless tobacco: Former Systems developer    Types: Chew  . Tobacco comment: quit smoking  & chewing by 1987  Substance Use Topics  . Alcohol use: Yes    Alcohol/week: 12.6 oz    Types: 9 Glasses of wine, 12 Cans of beer per week    Comment:  "none since surgery " 06/28/17     EXAMINATION:  Vital signs in last 24 hours: Temp:  [97.7 F (36.5 C)] 97.7 F (36.5 C) (12/31 0600) Pulse Rate:  [57] 57 (12/31 0600) Resp:  [18] 18 (12/31 0600) BP: (117)/(66) 117/66 (12/31 0601) SpO2:  [96 %] 96 % (12/31 0600) Weight:  [76.7 kg (169 lb)] 76.7 kg (169 lb) (12/31 0600)  BP 117/66   Pulse (!) 57   Temp 97.7 F (36.5 C) (Oral)   Resp 18   Wt 76.7 kg (169 lb)   SpO2 96%   BMI 25.70 kg/m   General Appearance:    Alert, cooperative, no distress, appears stated age  Head:    Normocephalic, without obvious abnormality, atraumatic  Eyes:    PERRL, conjunctiva/corneas clear, EOM's intact, fundi    benign, both eyes       Ears:    Normal TM's and external ear canals, both ears  Nose:   Nares normal, septum midline, mucosa normal, no drainage    or sinus tenderness  Throat:   Lips, mucosa, and tongue normal; teeth and  gums normal  Neck:   Supple, symmetrical, trachea midline, no adenopathy;       thyroid:  No enlargement/tenderness/nodules; no carotid   bruit or JVD  Back:     Symmetric, no curvature, ROM normal, no CVA tenderness  Lungs:     Clear to auscultation bilaterally, respirations unlabored  Chest wall:    No tenderness or deformity  Heart:    Regular rate and rhythm, S1 and S2 normal, no murmur, rub   or gallop  Abdomen:     Soft, non-tender, bowel sounds active all four quadrants,    no masses, no organomegaly  Genitalia:    Normal male without lesion, discharge or tenderness  Rectal:    Normal tone, normal prostate, no masses or tenderness;   guaiac negative stool  Extremities:   Extremities normal, atraumatic, no cyanosis or edema  Pulses:   2+ and symmetric all extremities  Skin:   Skin color, texture, turgor normal, no rashes or lesions  Lymph nodes:   Cervical, supraclavicular, and axillary nodes normal  Neurologic:   CNII-XII intact. Normal strength, sensation and reflexes      throughout    Musculoskeletal:  ROM 0-120, Ligaments intact,  Imaging Review Plain radiographs demonstrate severe degenerative joint disease of the right knee. The overall alignment is neutral. The bone quality appears to be excellent for age and reported activity level.  Assessment/Plan: Primary osteoarthritis, right knee   The patient history, physical examination and imaging studies are consistent with advanced degenerative joint disease of the right knee. The patient has failed conservative treatment.  The clearance notes were reviewed.  After discussion with the patient it was felt that Total Knee Replacement was indicated. The procedure,  risks, and benefits of total knee arthroplasty were presented and reviewed. The risks including  but not limited to aseptic loosening, infection, blood clots, vascular injury, stiffness, patella tracking problems complications among others were discussed. The patient  acknowledged the explanation, agreed to proceed with the plan.  Donia Ast 12/16/2017, 6:31 AM

## 2017-12-16 NOTE — Anesthesia Preprocedure Evaluation (Addendum)
Anesthesia Evaluation  Patient identified by MRN, date of birth, ID band Patient awake    Reviewed: Allergy & Precautions, H&P , NPO status , Patient's Chart, lab work & pertinent test results  History of Anesthesia Complications (+) PONV and history of anesthetic complications  Airway Mallampati: II  TM Distance: >3 FB Neck ROM: full    Dental  (+) Edentulous Upper, Edentulous Lower, Dental Advisory Given   Pulmonary sleep apnea , former smoker,    breath sounds clear to auscultation       Cardiovascular + dysrhythmias Atrial Fibrillation  Rhythm:regular Rate:Normal  Recent dx of parox afib   Neuro/Psych  Neuromuscular disease    GI/Hepatic hiatal hernia, GERD  ,  Endo/Other    Renal/GU      Musculoskeletal  (+) Arthritis ,   Abdominal   Peds  Hematology   Anesthesia Other Findings   Reproductive/Obstetrics                           Anesthesia Physical Anesthesia Plan  ASA: II  Anesthesia Plan: General   Post-op Pain Management:  Regional for Post-op pain   Induction: Intravenous  PONV Risk Score and Plan: 2 and Ondansetron, Dexamethasone and Treatment may vary due to age or medical condition  Airway Management Planned: Oral ETT  Additional Equipment:   Intra-op Plan:   Post-operative Plan: Extubation in OR  Informed Consent: I have reviewed the patients History and Physical, chart, labs and discussed the procedure including the risks, benefits and alternatives for the proposed anesthesia with the patient or authorized representative who has indicated his/her understanding and acceptance.     Plan Discussed with: CRNA, Anesthesiologist and Surgeon  Anesthesia Plan Comments:         Anesthesia Quick Evaluation

## 2017-12-16 NOTE — Transfer of Care (Signed)
Immediate Anesthesia Transfer of Care Note  Patient: Christopher [name]  Procedure(s) Performed: TOTAL KNEE ARTHROPLASTY (Right Knee)  Patient Location: PACU  Anesthesia Type:GA combined with regional for post-op pain  Level of Consciousness: awake and drowsy  Airway & Oxygen Therapy: Patient Spontanous Breathing and Patient connected to nasal cannula oxygen  Post-op Assessment: Report given to RN, Post -op Vital signs reviewed and stable and Patient moving all extremities X 4  Post vital signs: Reviewed and stable  Last Vitals:  Vitals:   12/16/17 0800 12/16/17 1023  BP: (!) 100/52   Pulse: (!) 51 (P) 69  Resp: 18 (P) 11  Temp:  (!) (P) 36.1 C  SpO2: 95% (P) 94%    Last Pain:  Vitals:   12/16/17 0600  TempSrc: Oral         Complications: No apparent anesthesia complications

## 2017-12-16 NOTE — Progress Notes (Signed)
Patient stated that he had not urinated since earlier this morning- even after surgery. Patient was then bladder scanned (>350) and asked to sit on the side of the bed to try urinate. He was able to urinate 100cc and asked to try again after dinner.

## 2017-12-17 ENCOUNTER — Encounter (HOSPITAL_COMMUNITY): Payer: Self-pay | Admitting: Orthopedic Surgery

## 2017-12-17 DIAGNOSIS — M1711 Unilateral primary osteoarthritis, right knee: Secondary | ICD-10-CM | POA: Diagnosis not present

## 2017-12-17 LAB — BASIC METABOLIC PANEL
Anion gap: 11 (ref 5–15)
BUN: 16 mg/dL (ref 6–20)
CO2: 23 mmol/L (ref 22–32)
Calcium: 8.8 mg/dL — ABNORMAL LOW (ref 8.9–10.3)
Chloride: 102 mmol/L (ref 101–111)
Creatinine, Ser: 1.08 mg/dL (ref 0.61–1.24)
GFR calc Af Amer: >60 mL/min (ref >60)
GFR calc non Af Amer: >60 mL/min (ref >60)
Glucose, Bld: 178 mg/dL — ABNORMAL HIGH (ref 65–99)
Potassium: 3.8 mmol/L (ref 3.5–5.1)
Sodium: 136 mmol/L (ref 135–145)

## 2017-12-17 LAB — CBC
HCT: 33.3 % — ABNORMAL LOW (ref 39.0–52.0)
Hemoglobin: 10.9 g/dL — ABNORMAL LOW (ref 13.0–17.0)
MCH: 30.4 pg (ref 26.0–34.0)
MCHC: 32.7 g/dL (ref 30.0–36.0)
MCV: 93 fL (ref 78.0–100.0)
Platelets: 241 10*3/uL (ref 150–400)
RBC: 3.58 MIL/uL — ABNORMAL LOW (ref 4.22–5.81)
RDW: 14 % (ref 11.5–15.5)
WBC: 12.9 10*3/uL — ABNORMAL HIGH (ref 4.0–10.5)

## 2017-12-17 NOTE — Evaluation (Signed)
Occupational Therapy Evaluation and Discharge Patient Details Name: Christopher [name] MRN: [id number] DOB: [birth year] Today's Date: 12/17/2017    History of Present Illness Pt is a 72 y/o male s/p elective R TKA. PMH includes a fib, DVT, Peripheral neuropathy, s/p ACDF, and s/p L TKA.    Clinical Impression   PTA, pt was independent with ADL and functional mobility. He currently requires min guard assist for LB ADL, toilet transfers, and shower transfers. Discussed safe shower transfers at length and pt reports that his RW will not fit in his bathroom and also will not fit in shower if he utilizes side-stepping approach. He was able to demonstrate safe use of counter and grab bar to support self while stepping into and out of the shower and educated pt on importance of having a family member with him to maximize safety. Pt additionally educated concerning compensatory strategies for LB ADL as well as knee precautions related to ADL. He demonstrates understanding of all education topics. No further OT needs identified and do not anticipate need for OT follow-up post-acute D/C.     Follow Up Recommendations  No OT follow up;DC plan and follow up therapy as arranged by surgeon    Equipment Recommendations  None recommended by OT    Recommendations for Other Services       Precautions / Restrictions Precautions Precautions: Knee Precaution Comments: Reviewed knee precautions related to ADL.  Restrictions Weight Bearing Restrictions: Yes RLE Weight Bearing: Weight bearing as tolerated      Mobility Bed Mobility Overal bed mobility: Needs Assistance Bed Mobility: Supine to Sit     Supine to sit: Supervision     General bed mobility comments: Supervision for safety.   Transfers Overall transfer level: Needs assistance Equipment used: Rolling walker (2 wheeled) Transfers: Sit to/from Stand Sit to Stand: Min guard         General transfer comment: cues for hand placement and  safety    Balance Overall balance assessment: Needs assistance Sitting-balance support: No upper extremity supported;Feet supported Sitting balance-Leahy Scale: Good     Standing balance support: Bilateral upper extremity supported;During functional activity Standing balance-Leahy Scale: Poor Standing balance comment: Reliant on BUE support                            ADL either performed or assessed with clinical judgement   ADL Overall ADL's : Needs assistance/impaired Eating/Feeding: Set up;Sitting   Grooming: Supervision/safety;Standing   Upper Body Bathing: Set up;Sitting   Lower Body Bathing: Min guard;Sit to/from stand   Upper Body Dressing : Set up;Sitting   Lower Body Dressing: Min guard;Sit to/from stand   Toilet Transfer: Min guard;Ambulation;RW;BSC   Toileting- Water quality scientist and Hygiene: Min guard;Sit to/from stand   Tub/ Shower Transfer: Min guard;Ambulation;Shower seat;Rolling walker;Walk-in shower   Functional mobility during ADLs: Min guard;Rolling walker General ADL Comments: Discussed shower transfers at length and multiple methods (stepping in backwards and sideways) to safely enter with RW. However, pt has small shower set up and reports RW will not fit in space. Demonstrated to OT method to utilize counter top for support. Educated pt that it is preferred if he uses RW but will need someone with him if he is to complete using environmental suports.      Vision Patient Visual Report: No change from baseline Vision Assessment?: No apparent visual deficits     Perception     Praxis  Pertinent Vitals/Pain Pain Assessment: Faces Pain Score: 2  Faces Pain Scale: Hurts little more Pain Location: R knee  Pain Descriptors / Indicators: Aching;Operative site guarding;Sore Pain Intervention(s): Monitored during session;Repositioned     Hand Dominance Right   Extremity/Trunk Assessment Upper Extremity Assessment Upper Extremity  Assessment: Overall WFL for tasks assessed   Lower Extremity Assessment Lower Extremity Assessment: RLE deficits/detail RLE Deficits / Details: Decreased strength and ROM as expected post-operatively.    Cervical / Trunk Assessment Cervical / Trunk Assessment: Normal   Communication Communication Communication: No difficulties   Cognition Arousal/Alertness: Awake/alert Behavior During Therapy: WFL for tasks assessed/performed Overall Cognitive Status: Within Functional Limits for tasks assessed                                     General Comments  Pt's wife present during session    Exercises Total Joint Exercises Hip ABduction/ADduction: AROM;Right;15 reps;Seated Straight Leg Raises: AROM;Right;15 reps;Seated Long Arc Quad: AROM;15 reps;Right;Seated Goniometric ROM: 0-100 Marching in Standing: AROM;Right;10 reps;Seated   Shoulder Instructions      Home Living Family/patient expects to be discharged to:: Private residence Living Arrangements: Spouse/significant other Available Help at Discharge: Family;Available 24 hours/day Type of Home: House Home Access: Ramped entrance     Home Layout: One level     Bathroom Shower/Tub: Occupational psychologist: Standard     Home Equipment: Bedside commode;Crutches;Cane - single point;Walker - 4 wheels;Walker - 2 wheels;Hospital bed;Shower seat          Prior Functioning/Environment Level of Independence: Independent                 OT Problem List: Decreased strength;Decreased activity tolerance;Impaired balance (sitting and/or standing);Decreased safety awareness;Decreased knowledge of use of DME or AE;Decreased knowledge of precautions;Pain;Decreased range of motion      OT Treatment/Interventions:      OT Goals(Current goals can be found in the care plan section) Acute Rehab OT Goals Patient Stated Goal: to get better  OT Goal Formulation: With patient Time For Goal Achievement:  12/31/17 Potential to Achieve Goals: Good  OT Frequency:     Barriers to D/C:            Co-evaluation              AM-PAC PT "6 Clicks" Daily Activity     Outcome Measure Help from another person eating meals?: A Little Help from another person taking care of personal grooming?: A Little Help from another person toileting, which includes using toliet, bedpan, or urinal?: A Little Help from another person bathing (including washing, rinsing, drying)?: A Little Help from another person to put on and taking off regular upper body clothing?: A Little Help from another person to put on and taking off regular lower body clothing?: A Little 6 Click Score: 18   End of Session Equipment Utilized During Treatment: Rolling walker CPM Right Knee CPM Right Knee: Off Nurse Communication: Mobility status  Activity Tolerance: Patient tolerated treatment well Patient left: in bed;Other (comment);with call bell/phone within reach;with family/visitor present(sitting at EOB)  OT Visit Diagnosis: Other abnormalities of gait and mobility (R26.89);Pain Pain - Right/Left: Right Pain - part of body: Knee                Time: 3244-0102 OT Time Calculation (min): 27 min Charges:  OT General Charges $OT Visit: 1 Visit OT Evaluation $OT Eval  Moderate Complexity: 1 Mod OT Treatments $Self Care/Home Management : 8-22 mins G-Codes:     Norman Herrlich, MS OTR/L  Pager: [place] A Atarah [place] 12/17/2017, 12:26 PM

## 2017-12-17 NOTE — Progress Notes (Signed)
Physical Therapy Treatment Patient Details Name: Christopher [name] MRN: [id number] DOB: [birth year] Today's Date: 12/17/2017    History of Present Illness Pt is a 72 y/o male s/p elective R TKA. PMH includes a fib, DVT, Peripheral neuropathy, s/p ACDF, and s/p L TKA.     PT Comments    Pt continues to move well and is safe for D/C. Educated for progression at home and to not remain resting in flexion as he has a tendency to sit with feet down.    Follow Up Recommendations  DC plan and follow up therapy as arranged by surgeon     Equipment Recommendations  None recommended by PT    Recommendations for Other Services       Precautions / Restrictions Precautions Precautions: Knee Restrictions Weight Bearing Restrictions: Yes RLE Weight Bearing: Weight bearing as tolerated    Mobility  Bed Mobility               General bed mobility comments: pt EOB on arrival  Transfers Overall transfer level: Modified independent   Transfers: Sit to/from Stand Sit to Stand: Supervision         General transfer comment: cues for hand placement and safety  Ambulation/Gait Ambulation/Gait assistance: Modified independent (Device/Increase time) Ambulation Distance (Feet): 300 Feet Assistive device: Rolling walker (2 wheeled) Gait Pattern/deviations: Step-through pattern;Decreased stride length;Trunk flexed Gait velocity: Decreased  Gait velocity interpretation: Below normal speed for age/gender General Gait Details: slow gait, pt with good heel strike and step length   Stairs            Wheelchair Mobility    Modified Rankin (Stroke Patients Only)       Balance                                            Cognition Arousal/Alertness: Awake/alert Behavior During Therapy: WFL for tasks assessed/performed Overall Cognitive Status: Within Functional Limits for tasks assessed                                        Exercises Total  Joint Exercises Hip ABduction/ADduction: AROM;Right;15 reps;Seated Straight Leg Raises: AROM;Right;15 reps;Seated Long Arc Quad: AROM;15 reps;Right;Seated Marching in Standing: AROM;Right;10 reps;Seated    General Comments        Pertinent Vitals/Pain Pain Score: 2  Pain Location: R knee  Pain Descriptors / Indicators: Aching;Operative site guarding;Sore Pain Intervention(s): Repositioned    Home Living                      Prior Function            PT Goals (current goals can now be found in the care plan section) Progress towards PT goals: Progressing toward goals    Frequency    7X/week      PT Plan Current plan remains appropriate    Co-evaluation              AM-PAC PT "6 Clicks" Daily Activity  Outcome Measure  Difficulty turning over in bed (including adjusting bedclothes, sheets and blankets)?: None Difficulty moving from lying on back to sitting on the side of the bed? : None Difficulty sitting down on and standing up from a chair with arms (e.g., wheelchair, bedside commode, etc,.)?: A  Little Help needed moving to and from a bed to chair (including a wheelchair)?: A Little Help needed walking in hospital room?: A Little Help needed climbing 3-5 steps with a railing? : A Little 6 Click Score: 20    End of Session   Activity Tolerance: Patient tolerated treatment well Patient left: in chair;with call bell/phone within reach;with family/visitor present Nurse Communication: Mobility status PT Visit Diagnosis: Other abnormalities of gait and mobility (R26.89);Pain Pain - Right/Left: Right Pain - part of body: Knee     Time: 3734-2876 PT Time Calculation (min) (ACUTE ONLY): 15 min  Charges:  $Gait Training: 8-22 mins $Therapeutic Exercise: 8-22 mins                    G Codes:       Elwyn Reach, PT [phone]    [place] 12/17/2017, 11:56 AM

## 2017-12-17 NOTE — Discharge Summary (Signed)
PATIENT ID: Christopher [name]        MRN:  [id number]          DOB/AGE: Oct 19, 1946 / 72 y.o.    DISCHARGE SUMMARY  ADMISSION DATE:    12/16/2017 DISCHARGE DATE:   12/17/2017   ADMISSION DIAGNOSIS: S/P total knee replacement [Z96.659]    DISCHARGE DIAGNOSIS:  primary osteoarthritis right knee    ADDITIONAL DIAGNOSIS: Active Problems:   S/P total knee replacement  Past Medical History:  Diagnosis Date  . A-fib ([place])   . Arthritis    "all over my body" (06/17/2017)  . BPH (benign prostatic hyperplasia)    takes Proscar daily  . Chronic back pain    DDD; "upper and lower" (06/17/2017)  . Constipation    takes Colace daily  . DDD (degenerative disc disease), cervical   . DVT (deep venous thrombosis) ([facility])    left peroneal vein 07/2017 (in setting of recent left TKA)  . Dysrhythmia    history of Atrial fibrillation, no long has this  . Enlarged prostate   . GERD (gastroesophageal reflux disease)    takes Nexium daily  . H/O hiatal hernia   . History of colon polyps    benign  . History of gastric ulcer   . Insomnia    takes Xanax nightly  . Joint capsule tear    left knee  . Joint pain   . Joint swelling   . Macular degeneration   . Muscle spasm of back    takes Valium daily as needed  . PAF (paroxysmal atrial fibrillation) ([place])   . Peripheral neuropathy    takes Gabapentin daily  . PONV (postoperative nausea and vomiting)   . Sleep apnea    pt does not wear a cpap  . Urinary hesitancy   . Weakness    numbness and tingling    PROCEDURE: Procedure(s): TOTAL KNEE ARTHROPLASTY Right on 12/16/2017  CONSULTS: PT/OT    HISTORY:  See H&P in chart  HOSPITAL COURSE:  Christopher [name] is a 72 y.o. admitted on 12/16/2017 and found to have a diagnosis of primary osteoarthritis right knee.  After appropriate laboratory studies were obtained  they were taken to the operating room on 12/16/2017 and underwent  Procedure(s): TOTAL KNEE ARTHROPLASTY  Right.   They were given  perioperative antibiotics:  Anti-infectives (From admission, onward)   Start     Dose/Rate Route Frequency Ordered Stop   12/16/17 1500  ceFAZolin (ANCEF) IVPB 1 g/50 mL premix     1 g 100 mL/hr over 30 Minutes Intravenous Every 6 hours 12/16/17 1147 12/16/17 2240   12/16/17 0815  ceFAZolin (ANCEF) IVPB 2g/100 mL premix     2 g 200 mL/hr over 30 Minutes Intravenous To Surgery 12/16/17 0809 12/16/17 0830   12/16/17 0613  ceFAZolin (ANCEF) 2-4 GM/100ML-% IVPB    Comments:  Tamsen Snider   : cabinet override      12/16/17 0613 12/16/17 0830   12/13/17 1047  ceFAZolin (ANCEF) IVPB 2g/100 mL premix     2 g 200 mL/hr over 30 Minutes Intravenous On call to O.R. 12/13/17 1047 12/14/17 0559    .  Tolerated the procedure well.    POD #1, allowed out of bed to a chair.  PT for ambulation and exercise program.   Pt c/o sore throat and sputum, ordered culture as requested by nursing staff prior to d/c home  The remainder of the hospital course was dedicated to ambulation and strengthening.  The patient was discharged on 1 Day Post-Op in  Stable condition.  Blood products given:none  DIAGNOSTIC STUDIES: Recent vital signs:  Patient Vitals for the past 24 hrs:  BP Temp Temp src Pulse Resp SpO2 Height Weight  12/17/17 0826 - - - - - 94 % - -  12/17/17 0433 (!) 96/40 98.6 F (37 C) Oral 72 16 94 % - -  12/16/17 2213 (!) 95/54 97.8 F (36.6 C) Oral 64 18 94 % - -  12/16/17 1704 (!) 104/57 97.7 F (36.5 C) Oral (!) 57 - 95 % - -  12/16/17 1600 (!) 106/58 - - 72 - - - -  12/16/17 1150 (!) 101/49 - - (!) 55 16 98 % 5\' 8"  (1.727 m) 76.2 kg (168 lb)  12/16/17 1130 (!) 106/57 (!) 97.2 F (36.2 C) - 60 16 96 % - -  12/16/17 1115 (!) 94/53 - - (!) 56 14 93 % - -  12/16/17 1100 109/61 - - - - - - -  12/16/17 1045 109/63 - - 74 15 94 % - -       Recent laboratory studies: Recent Labs    12/17/17 0440  WBC 12.9*  HGB 10.9*  HCT 33.3*  PLT 241   Recent Labs    12/17/17 0440  NA 136   K 3.8  CL 102  CO2 23  BUN 16  CREATININE 1.08  GLUCOSE 178*  CALCIUM 8.8*   Lab Results  Component Value Date   INR 1.02 12/16/2017   INR 1.02 07/22/2017     Recent Radiographic Studies :  No results found.  DISCHARGE INSTRUCTIONS:   DISCHARGE MEDICATIONS:   Allergies as of 12/17/2017      Reactions   Morphine And Related Nausea And Vomiting   Propofol Nausea And Vomiting      Medication List    STOP taking these medications   apixaban 5 MG Tabs tablet Commonly known as:  ELIQUIS   naproxen 500 MG tablet Commonly known as:  NAPROSYN     TAKE these medications   alfuzosin 10 MG 24 hr tablet Commonly known as:  UROXATRAL Take 10 mg by mouth daily.   ALPRAZolam 1 MG tablet Commonly known as:  XANAX Take 1 mg by mouth at bedtime.   aspirin 325 MG EC tablet Take 1 tablet (325 mg total) by mouth 2 (two) times daily.   baclofen 10 MG tablet Commonly known as:  LIORESAL Take 1 tablet (10 mg total) by mouth 3 (three) times daily.   diltiazem 60 MG tablet Commonly known as:  CARDIZEM Take 1 tablet (60 mg total) by mouth 3 (three) times daily.   docusate sodium 100 MG capsule Commonly known as:  COLACE Take 200 mg by mouth daily.   Fish Oil 1000 MG Caps Take 1,000 mg by mouth daily.   gabapentin 400 MG capsule Commonly known as:  NEURONTIN Take 800 mg by mouth daily.   HYDROcodone-acetaminophen 10-325 MG tablet Commonly known as:  NORCO Take 1 tablet by mouth 3 (three) times daily as needed for moderate pain.   Magnesium 200 MG Tabs Take 1 tablet (200 mg total) by mouth daily.   magnesium oxide 400 MG tablet Commonly known as:  MAG-OX Take 200 mg by mouth daily.   Oxycodone HCl 10 MG Tabs Take 1 tablet (10 mg total) by mouth every 4 (four) hours as needed for moderate pain ((score 4 to 6)).   pantoprazole 40 MG tablet Commonly known  as:  PROTONIX Take 2 tablets (80 mg total) by mouth daily at 12 noon. What changed:  how much to take    Probiotic Caps Take 1 capsule by mouth daily.   tamsulosin 0.4 MG Caps capsule Commonly known as:  FLOMAX Take 0.4 mg by mouth daily.   vitamin B-12 1000 MCG tablet Commonly known as:  CYANOCOBALAMIN Take 1,000 mcg by mouth daily.   Vitamin D3 2000 units capsule Take 2,000 Units by mouth daily.            Durable Medical Equipment  (From admission, onward)        Start     Ordered   12/16/17 1148  DME Walker rolling  Once    Question:  Patient needs a walker to treat with the following condition  Answer:  S/P total knee replacement   12/16/17 1147   12/16/17 1148  DME 3 n 1  Once     12/16/17 1147   12/16/17 1148  DME Bedside commode  Once    Question:  Patient needs a bedside commode to treat with the following condition  Answer:  S/P total knee replacement   12/16/17 1147      FOLLOW UP VISIT:    DISPOSITION:   Home  CONDITION:  Stable   Chriss Czar, PA-C  12/17/2017 10:41 AM

## 2017-12-17 NOTE — Progress Notes (Signed)
Physical Therapy Treatment Patient Details Name: Christopher [name] MRN: [id number] DOB: October 17, 1946 Today's Date: 12/17/2017    History of Present Illness Pt is a 72 y/o male s/p elective R TKA. PMH includes a fib, DVT, Peripheral neuropathy, s/p ACDF, and s/p L TKA.     PT Comments    Pt very pleasant with excellent progression with gait, HEP and transfers. Pt continues to report right foot numbness but otherwise doing well despite pain. Pt eager to return home and mobilizing well for D/C today. Pt educated for knee precautions and HEP, in heel foam end of session. Will follow acutely.    Follow Up Recommendations  DC plan and follow up therapy as arranged by surgeon     Equipment Recommendations  None recommended by PT    Recommendations for Other Services       Precautions / Restrictions Precautions Precautions: Knee Restrictions RLE Weight Bearing: Weight bearing as tolerated    Mobility  Bed Mobility               General bed mobility comments: pt EOB on arrival  Transfers Overall transfer level: Needs assistance   Transfers: Sit to/from Stand Sit to Stand: Supervision         General transfer comment: cues for hand placement and safety  Ambulation/Gait Ambulation/Gait assistance: Supervision Ambulation Distance (Feet): 300 Feet Assistive device: Rolling walker (2 wheeled) Gait Pattern/deviations: Step-through pattern;Decreased stride length;Trunk flexed   Gait velocity interpretation: Below normal speed for age/gender General Gait Details: cues for posture, pt with good heel strike and step length   Stairs            Wheelchair Mobility    Modified Rankin (Stroke Patients Only)       Balance                                            Cognition Arousal/Alertness: Awake/alert Behavior During Therapy: WFL for tasks assessed/performed Overall Cognitive Status: Within Functional Limits for tasks assessed                                         Exercises Total Joint Exercises Straight Leg Raises: AROM;Right;15 reps;Seated Long Arc Quad: AROM;15 reps;Right;Seated Goniometric ROM: 0-100 Marching in Standing: AROM;Right;10 reps;Seated    General Comments        Pertinent Vitals/Pain Pain Score: 6  Pain Location: R knee  Pain Descriptors / Indicators: Aching;Operative site guarding;Sore Pain Intervention(s): Limited activity within patient's tolerance;Repositioned;Patient requesting pain meds-RN notified;Monitored during session    Home Living                      Prior Function            PT Goals (current goals can now be found in the care plan section) Progress towards PT goals: Progressing toward goals    Frequency    7X/week      PT Plan Current plan remains appropriate    Co-evaluation              AM-PAC PT "6 Clicks" Daily Activity  Outcome Measure  Difficulty turning over in bed (including adjusting bedclothes, sheets and blankets)?: None Difficulty moving from lying on back to sitting on the side of the bed? :  A Little Difficulty sitting down on and standing up from a chair with arms (e.g., wheelchair, bedside commode, etc,.)?: A Little Help needed moving to and from a bed to chair (including a wheelchair)?: A Little Help needed walking in hospital room?: A Little Help needed climbing 3-5 steps with a railing? : A Little 6 Click Score: 19    End of Session   Activity Tolerance: Patient tolerated treatment well Patient left: in chair;with call bell/phone within reach Nurse Communication: Mobility status PT Visit Diagnosis: Other abnormalities of gait and mobility (R26.89);Pain Pain - Right/Left: Right Pain - part of body: Knee     Time: 0751-0820 PT Time Calculation (min) (ACUTE ONLY): 29 min  Charges:  $Gait Training: 8-22 mins $Therapeutic Exercise: 8-22 mins                    G Codes:       Elwyn Reach,  PT [phone]    [place] 12/17/2017, 8:29 AM

## 2017-12-17 NOTE — Plan of Care (Signed)
  Adequate for Discharge Education: Knowledge of General Education information will improve 12/17/2017 1127 - Adequate for Discharge by Rance Muir, RN Health Behavior/Discharge Planning: Ability to manage health-related needs will improve 12/17/2017 1127 - Adequate for Discharge by Rance Muir, RN Clinical Measurements: Ability to maintain clinical measurements within normal limits will improve 12/17/2017 1127 - Adequate for Discharge by Rance Muir, RN Will remain free from infection 12/17/2017 1127 - Adequate for Discharge by Rance Muir, RN Diagnostic test results will improve 12/17/2017 1127 - Adequate for Discharge by Rance Muir, RN Respiratory complications will improve 12/17/2017 1127 - Adequate for Discharge by Rance Muir, RN Cardiovascular complication will be avoided 12/17/2017 1127 - Adequate for Discharge by Rance Muir, RN Activity: Risk for activity intolerance will decrease 12/17/2017 1127 - Adequate for Discharge by Rance Muir, RN Nutrition: Adequate nutrition will be maintained 12/17/2017 1127 - Adequate for Discharge by Rance Muir, RN Coping: Level of anxiety will decrease 12/17/2017 1127 - Adequate for Discharge by Rance Muir, RN Elimination: Will not experience complications related to bowel motility 12/17/2017 1127 - Adequate for Discharge by Rance Muir, RN Will not experience complications related to urinary retention 12/17/2017 1127 - Adequate for Discharge by Rance Muir, RN Pain Managment: General experience of comfort will improve 12/17/2017 1127 - Adequate for Discharge by Rance Muir, RN Safety: Ability to remain free from injury will improve 12/17/2017 1127 - Adequate for Discharge by Rance Muir, RN Skin Integrity: Risk for impaired skin integrity will decrease 12/17/2017 1127 - Adequate for Discharge by Rance Muir, RN

## 2017-12-18 ENCOUNTER — Encounter (HOSPITAL_COMMUNITY): Payer: Self-pay | Admitting: Orthopedic Surgery

## 2017-12-18 NOTE — Op Note (Signed)
TOTAL KNEE REPLACEMENT OPERATIVE NOTE:  12/16/2017  6:39 AM  PATIENT:  Christopher [name]  72 y.o. male  PRE-OPERATIVE DIAGNOSIS:  primary osteoarthritis right knee  POST-OPERATIVE DIAGNOSIS:  primary osteoarthritis right knee  PROCEDURE:  Procedure(s): TOTAL KNEE ARTHROPLASTY  SURGEON:  Surgeon(s): Christopher Huger, MD  PHYSICIAN ASSISTANT:Christopher [name], [facility]  ANESTHESIA:   spinal  DRAINS: Hemovac  SPECIMEN: None  COUNTS:  Correct  TOURNIQUET:   Total Tourniquet Time Documented: Thigh (Right) - 40 minutes Total: Thigh (Right) - 40 minutes   DICTATION:  Indication for procedure:    The patient is a 72 y.o. male who has failed conservative treatment for primary osteoarthritis right knee.  Informed consent was obtained prior to anesthesia. The risks versus benefits of the operation were explain and in a way the patient can, and did, understand.   On the implant demand matching protocol, this patient scored 8.  Therefore, this patient was not receive a polyethylene insert with vitamin E which is a high demand implant.  Description of procedure:     The patient was taken to the operating room and placed under anesthesia.  The patient was positioned in the usual fashion taking care that all body parts were adequately padded and/or protected.  I foley catheter was not placed.  A tourniquet was applied and the leg prepped and draped in the usual sterile fashion.  The extremity was exsanguinated with the esmarch and tourniquet inflated to 350 mmHg.  Pre-operative range of motion was normal.  The knee was in 5 degree of mild varus.  A midline incision approximately 6-7 inches long was made with a #10 blade.  A new blade was used to make a parapatellar arthrotomy going 2-3 cm into the quadriceps tendon, over the [name], and alongside the medial aspect of the patellar tendon.  A synovectomy was then performed with the #10 blade and forceps. I then elevated the deep MCL off the medial tibial  metaphysis subperiosteally around to the semimembranosus attachment.    I everted the [name] and used calipers to measure patellar thickness.  I used the reamer to ream down to appropriate thickness to recreate the native thickness.  I then removed excess bone with the rongeur and sagittal saw.  I used the appropriately sized template and drilled the three lug holes.  I then put the trial in place and measured the thickness with the calipers to ensure recreation of the native thickness.  The trial was then removed and the [name] subluxed and the knee brought into flexion.  A homan retractor was place to retract and protect the [name] and lateral structures.  A Z-retractor was place medially to protect the medial structures.  The extra-medullary alignment system was used to make cut the tibial articular surface perpendicular to the anamotic axis of the tibia and in 3 degrees of posterior slope.  The cut surface and alignment jig was removed.  I then used the intramedullary alignment guide to make a 6 valgus cut on the distal femur.  I then marked out the epicondylar axis on the distal femur.  The posterior condylar axis measured 3 degrees.  I then used the anterior referencing sizer and measured the femur to be a size 6.  The 4-In-1 cutting block was screwed into place in external rotation matching the posterior condylar angle, making our cuts perpendicular to the epicondylar axis.  Anterior, posterior and chamfer cuts were made with the sagittal saw.  The cutting block and cut pieces were removed.  A lamina spreader was placed in 90 degrees of flexion.  The ACL, PCL, menisci, and posterior condylar osteophytes were removed.  A 11 mm spacer blocked was found to offer good flexion and extension gap balance after mild in degree releasing.   The scoop retractor was then placed and the femoral finishing block was pinned in place.  The small sagittal saw was used as well as the lug drill to finish the femur.   The block and cut surfaces were removed and the medullary canal hole filled with autograft bone from the cut pieces.  The tibia was delivered forward in deep flexion and external rotation.  A size D tray was selected and pinned into place centered on the medial 1/3 of the tibial tubercle.  The reamer and keel was used to prepare the tibia through the tray.    I then trialed with the size 6 femur, size D tibia, a 11 mm insert and the 35 [name].  I had excellent flexion/extension gap balance, excellent [name] tracking.  Flexion was full and beyond 120 degrees; extension was zero.  These components were chosen and the staff opened them to me on the back table while the knee was lavaged copiously and the cement mixed.  The soft tissue was infiltrated with 60cc of exparel 1.3% through a 21 gauge needle.  I cemented in the components and removed all excess cement.  The polyethylene tibial component was snapped into place and the knee placed in extension while cement was hardening.  The capsule was infilltrated with 30cc of .25% Marcaine with epinephrine.  A hemovac was place in the joint exiting superolaterally.  A pain pump was place superomedially superficial to the arthrotomy.  Once the cement was hard, the tourniquet was let down.  Hemostasis was obtained.  The arthrotomy was closed with figure-8 #1 vicryl sutures.  The deep soft tissues were closed with #0 vicryls and the subcuticular layer closed with a running #2-0 vicryl.  The skin was reapproximated and closed with skin staples.  The wound was dressed with xeroform, 4 x4's, 2 ABD sponges, a single layer of webril and a TED stocking.   The patient was then awakened, extubated, and taken to the recovery room in stable condition.  BLOOD LOSS:  300cc DRAINS: 1 hemovac, 1 pain catheter COMPLICATIONS:  None.  PLAN OF CARE: Admit for overnight observation  PATIENT DISPOSITION:  PACU - hemodynamically stable.   Delay start of Pharmacological VTE agent  (>24hrs) due to surgical blood loss or risk of bleeding:  not applicable  Please fax a copy of this op note to my office at [phone] (please only include page 1 and 2 of the Case Information op note)

## 2017-12-18 NOTE — Anesthesia Postprocedure Evaluation (Signed)
Anesthesia Post Note  Patient: Christopher [name]  Procedure(s) Performed: TOTAL KNEE ARTHROPLASTY (Right Knee)     Patient location during evaluation: PACU Anesthesia Type: General Level of consciousness: awake and alert Pain management: pain level controlled Vital Signs Assessment: post-procedure vital signs reviewed and stable Respiratory status: spontaneous breathing, nonlabored ventilation, respiratory function stable and patient connected to nasal cannula oxygen Cardiovascular status: blood pressure returned to baseline and stable Postop Assessment: no apparent nausea or vomiting Anesthetic complications: no    Last Vitals:  Vitals:   12/17/17 0433 12/17/17 0826  BP: (!) 96/40   Pulse: 72   Resp: 16   Temp: 37 C   SpO2: 94% 94%    Last Pain:  Vitals:   12/17/17 0433  TempSrc: Oral  PainSc:                  [place] S

## 2018-01-24 ENCOUNTER — Other Ambulatory Visit (HOSPITAL_COMMUNITY): Payer: Self-pay | Admitting: Family Medicine

## 2018-01-24 ENCOUNTER — Ambulatory Visit (HOSPITAL_COMMUNITY)
Admission: RE | Admit: 2018-01-24 | Discharge: 2018-01-24 | Disposition: A | Discharge status: Home / Self Care | Payer: Medicare Other | Source: Ambulatory Visit | Attending: Family Medicine | Admitting: Family Medicine

## 2018-01-24 DIAGNOSIS — Z96651 Presence of right artificial knee joint: Secondary | ICD-10-CM

## 2018-01-24 DIAGNOSIS — M7989 Other specified soft tissue disorders: Secondary | ICD-10-CM | POA: Insufficient documentation

## 2018-02-11 ENCOUNTER — Encounter (INDEPENDENT_AMBULATORY_CARE_PROVIDER_SITE_OTHER): Payer: Self-pay | Admitting: *Deleted

## 2018-02-15 ENCOUNTER — Encounter (HOSPITAL_COMMUNITY): Payer: Self-pay | Admitting: Emergency Medicine

## 2018-02-15 ENCOUNTER — Other Ambulatory Visit: Payer: Self-pay

## 2018-02-15 ENCOUNTER — Emergency Department (HOSPITAL_COMMUNITY)
Admission: EM | Admit: 2018-02-15 | Discharge: 2018-02-16 | Disposition: A | Discharge status: Home / Self Care | Payer: Medicare Other | Attending: Emergency Medicine | Admitting: Emergency Medicine

## 2018-02-15 ENCOUNTER — Emergency Department (HOSPITAL_COMMUNITY): Payer: Medicare Other

## 2018-02-15 DIAGNOSIS — Z7901 Long term (current) use of anticoagulants: Secondary | ICD-10-CM | POA: Insufficient documentation

## 2018-02-15 DIAGNOSIS — I4891 Unspecified atrial fibrillation: Secondary | ICD-10-CM | POA: Diagnosis not present

## 2018-02-15 DIAGNOSIS — Z79899 Other long term (current) drug therapy: Secondary | ICD-10-CM | POA: Diagnosis not present

## 2018-02-15 DIAGNOSIS — Z96653 Presence of artificial knee joint, bilateral: Secondary | ICD-10-CM | POA: Insufficient documentation

## 2018-02-15 DIAGNOSIS — R002 Palpitations: Secondary | ICD-10-CM | POA: Diagnosis present

## 2018-02-15 DIAGNOSIS — Z87891 Personal history of nicotine dependence: Secondary | ICD-10-CM | POA: Insufficient documentation

## 2018-02-15 LAB — CBC
HCT: 37.5 % — ABNORMAL LOW (ref 39.0–52.0)
Hemoglobin: 12 g/dL — ABNORMAL LOW (ref 13.0–17.0)
MCH: 29.6 pg (ref 26.0–34.0)
MCHC: 32 g/dL (ref 30.0–36.0)
MCV: 92.6 fL (ref 78.0–100.0)
Platelets: 236 10*3/uL (ref 150–400)
RBC: 4.05 MIL/uL — ABNORMAL LOW (ref 4.22–5.81)
RDW: 12.4 % (ref 11.5–15.5)
WBC: 6.5 10*3/uL (ref 4.0–10.5)

## 2018-02-15 LAB — BASIC METABOLIC PANEL
Anion gap: 10 (ref 5–15)
BUN: 16 mg/dL (ref 6–20)
CO2: 24 mmol/L (ref 22–32)
Calcium: 9.6 mg/dL (ref 8.9–10.3)
Chloride: 107 mmol/L (ref 101–111)
Creatinine, Ser: 0.94 mg/dL (ref 0.61–1.24)
GFR calc Af Amer: >60 mL/min (ref >60)
GFR calc non Af Amer: >60 mL/min (ref >60)
Glucose, Bld: 119 mg/dL — ABNORMAL HIGH (ref 65–99)
Potassium: 3.5 mmol/L (ref 3.5–5.1)
Sodium: 141 mmol/L (ref 135–145)

## 2018-02-15 LAB — TROPONIN I: Troponin I: <0.03 ng/mL (ref <0.03)

## 2018-02-15 MED ORDER — DILTIAZEM HCL-DEXTROSE 100-5 MG/100ML-% IV SOLN (PREMIX)
5.0000 mg/h | INTRAVENOUS | Status: DC
  Administered 2018-02-16: 5 mg/h via INTRAVENOUS
  Filled 2018-02-15: qty 100

## 2018-02-15 MED ORDER — DILTIAZEM LOAD VIA INFUSION
10.0000 mg | Freq: Once | INTRAVENOUS | Status: AC
  Administered 2018-02-16: 10 mg via INTRAVENOUS
  Filled 2018-02-15: qty 10

## 2018-02-15 NOTE — ED Provider Notes (Addendum)
[facility] EMERGENCY DEPARTMENT Provider Note   CSN: [id number] Arrival date & time: 02/15/18  2140     History   Chief Complaint Chief Complaint  Patient presents with  . Palpitations    HPI Christopher [name] is a 72 y.o. male.  Patient presents from home with concerns over atrial fibrillation with rapid ventricular response.  Patient has a history of paroxysmal atrial fibrillation.  He reports that he frequently has brief episodes at night.  Usually he can lie down and the irregular heartbeat resolves.  Tonight his heart rate got up into the 150 rate and he felt the palpitations and discomfort in the chest.  He is not short of breath.  There is no nausea, vomiting, diaphoresis.     Past Medical History:  Diagnosis Date  . A-fib ([place])   . Arthritis    "all over my body" (06/17/2017)  . BPH (benign prostatic hyperplasia)    takes Proscar daily  . Chronic back pain    DDD; "upper and lower" (06/17/2017)  . Constipation    takes Colace daily  . DDD (degenerative disc disease), cervical   . DVT (deep venous thrombosis) ([facility])    left peroneal vein 07/2017 (in setting of recent left TKA)  . Dysrhythmia    history of Atrial fibrillation, no long has this  . Enlarged prostate   . GERD (gastroesophageal reflux disease)    takes Nexium daily  . H/O hiatal hernia   . History of colon polyps    benign  . History of gastric ulcer   . Insomnia    takes Xanax nightly  . Joint capsule tear    left knee  . Joint pain   . Joint swelling   . Macular degeneration   . Muscle spasm of back    takes Valium daily as needed  . PAF (paroxysmal atrial fibrillation) ([place])   . Peripheral neuropathy    takes Gabapentin daily  . PONV (postoperative nausea and vomiting)   . Sleep apnea    pt does not wear a cpap  . Urinary hesitancy   . Weakness    numbness and tingling    Patient Active Problem List   Diagnosis Date Noted  . Atrial fibrillation with RVR ([place]) 07/23/2017  . History of  gastric ulcer   . Chronic back pain   . BPH (benign prostatic hyperplasia)   . 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 Spondylolisthesis of lumbar region 03/05/2014  . Barrett esophagus 02/24/2013  . Personal history of colonic polyps 02/24/2013  . Epigastric pain 01/06/2013  . Encounter for screening colonoscopy 01/06/2013  . Pain in joint, shoulder region 07/09/2012  . Muscle weakness (generalized) 07/09/2012    Past Surgical History:  Procedure Laterality Date  . ANTERIOR CERVICAL DECOMP/DISCECTOMY FUSION  ~ 1991   "took bone from hip"  . ANTERIOR CERVICAL DECOMP/DISCECTOMY FUSION  X 2   "put plate in"  . ANTERIOR CERVICAL DISCECTOMY  <11/2005    5-6 and 4-5./notes 05/02/2011  . APPENDECTOMY    . BACK SURGERY    . CARDIAC CATHETERIZATION    . CARPAL TUNNEL RELEASE Bilateral 10/2005 - 11/2005   right-left Archie Endo 05/02/2011  . CATARACT EXTRACTION W/PHACO Right 05/24/2014   Procedure: CATARACT EXTRACTION PHACO AND INTRAOCULAR LENS PLACEMENT (IOC);  Surgeon: [name], MD;  Location: [facility] OR[facility];  Service: Ophthalmology;  Laterality: Right;  CDE 6.74  . CATARACT EXTRACTION W/PHACO Left 06/17/2014   Procedure: CATARACT EXTRACTION PHACO AND INTRAOCULAR LENS  PLACEMENT (IOC);  Surgeon: [name], MD;  Location: [facility] OR[facility];  Service: Ophthalmology;  Laterality: Left;  CDE:4.65  . COLONOSCOPY WITH ESOPHAGOGASTRODUODENOSCOPY (EGD) N/A 01/22/2013   Procedure: COLONOSCOPY WITH ESOPHAGOGASTRODUODENOSCOPY (EGD);  Surgeon: [name], MD;  Location: [facility];  Service: Endoscopy;  Laterality: N/A;  140  . EYE SURGERY Bilateral    "cleaned film w/laser; since my 1st cataract OR"  . JOINT REPLACEMENT    . POSTERIOR LUMBAR FUSION     "put a plate in"  . QUADRICEPS TENDON REPAIR Left 07/01/2017  . QUADRICEPS TENDON REPAIR Left 07/01/2017   Procedure: REPAIR QUADRICEP TENDON;  Surgeon: [name], MD;  Location: [place];  Service: Orthopedics;  Laterality: Left;  . SHOULDER ARTHROSCOPY Bilateral     . SPINAL CORD STIMULATOR INSERTION N/A 09/23/2015   Procedure: LUMBAR SPINAL CORD STIMULATOR INSERTION;  Surgeon: [name], MD;  Location: [place] [facility];  Service: Neurosurgery;  Laterality: N/A;  LUMBAR SPINAL CORD STIMULATOR INSERTION  . TOTAL KNEE ARTHROPLASTY Left 06/17/2017  . TOTAL KNEE ARTHROPLASTY Left 06/17/2017   Procedure: TOTAL KNEE ARTHROPLASTY;  Surgeon: [name], MD;  Location: [place];  Service: Orthopedics;  Laterality: Left;  . TOTAL KNEE ARTHROPLASTY Right 12/16/2017   Procedure: TOTAL KNEE ARTHROPLASTY;  Surgeon: [name], MD;  Location: [place];  Service: Orthopedics;  Laterality: Right;       Home Medications    Prior to Admission medications   Medication Sig Start Date End Date Taking? Authorizing Provider  alfuzosin (UROXATRAL) 10 MG 24 hr tablet Take 10 mg by mouth daily.   Yes [provider]  ALPRAZolam Duanne Moron) 1 MG tablet Take 1 mg by mouth at bedtime. 09/27/17  Yes [provider]  apixaban (ELIQUIS) 5 MG TABS tablet Take 5 mg by mouth 2 (two) times daily.   Yes [provider]  baclofen (LIORESAL) 10 MG tablet Take 1 tablet (10 mg total) by mouth 3 (three) times daily. 12/16/17  Yes Donia Ast, [place]  Cholecalciferol (VITAMIN D3) 2000 units capsule Take 2,000 Units by mouth daily.   Yes [provider]  diltiazem (CARDIZEM) 60 MG tablet Take 1 tablet (60 mg total) by mouth 3 (three) times daily. Patient taking differently: Take 60 mg by mouth 2 (two) times daily.  10/09/17  Yes Virgel Manifold, MD  docusate sodium (COLACE) 100 MG capsule Take 200 mg by mouth daily.   Yes [provider]  gabapentin (NEURONTIN) 400 MG capsule Take 800 mg by mouth daily.  07/26/17  Yes [provider]  HYDROcodone-acetaminophen (NORCO) 10-325 MG tablet Take 1 tablet by mouth 3 (three) times daily as needed for moderate pain.   Yes [provider]  magnesium oxide (MAG-OX) 400 MG tablet Take 200 mg by mouth  daily.   Yes [provider]  naproxen (NAPROSYN) 500 MG tablet Take 500 mg by mouth 2 (two) times daily. 01/17/18  Yes [provider]  Omega-3 Fatty Acids (FISH OIL) 1000 MG CAPS Take 1,000 mg by mouth daily.    Yes [provider]  pantoprazole (PROTONIX) 40 MG tablet Take 1 tablet by mouth daily. 02/11/18  Yes [provider]  Probiotic CAPS Take 1 capsule by mouth daily.   Yes [provider]  tamsulosin (FLOMAX) 0.4 MG CAPS capsule Take 0.4 mg by mouth daily.   Yes [provider]  vitamin B-12 (CYANOCOBALAMIN) 1000 MCG tablet Take 1,000 mcg by mouth daily.   Yes [provider]  Family History History reviewed. No pertinent family history.  Social History Social History   Tobacco Use  . Smoking status: Former Smoker    Packs/day: 1.00    Years: 30.00    Pack years: 30.00    Types: Cigarettes    Last attempt to quit: 01/14/1987    Years since quitting: 31.1  . Smokeless tobacco: Former Systems developer    Types: Chew  . Tobacco comment: quit smoking  & chewing by 1987  Substance Use Topics  . Alcohol use: Yes    Alcohol/week: 12.6 oz    Types: 9 Glasses of wine, 12 Cans of beer per week    Comment:  "none since surgery " 06/28/17  . Drug use: No     Allergies   Morphine and related and Propofol   Review of Systems Review of Systems  Cardiovascular: Positive for palpitations.  All other systems reviewed and are negative.    Physical Exam Updated Vital Signs BP 108/68   Pulse 65   Temp 99.2 F (37.3 C) (Oral)   Resp 14   Ht 5\' 8"  (1.727 m)   Wt 74.4 kg (164 lb)   SpO2 94%   BMI 24.94 kg/m   Physical Exam  Constitutional: He is oriented to person, place, and time. He appears well-developed and well-nourished. No distress.  HENT:  Head: Normocephalic and atraumatic.  Right Ear: Hearing normal.  Left Ear: Hearing normal.  Nose: Nose normal.  Mouth/Throat: Oropharynx is clear and moist and mucous  membranes are normal.  Eyes: Conjunctivae and EOM are normal. Pupils are equal, round, and reactive to light.  Neck: Normal range of motion. Neck supple.  Cardiovascular: S1 normal and S2 normal. An irregularly irregular rhythm present. Tachycardia present. Exam reveals no gallop and no friction rub.  No murmur heard. Pulmonary/Chest: Effort normal and breath sounds normal. No respiratory distress. He exhibits no tenderness.  Abdominal: Soft. Normal appearance and bowel sounds are normal. There is no hepatosplenomegaly. There is no tenderness. There is no rebound, no guarding, no tenderness at McBurney's point and negative Murphy's sign. No hernia.  Musculoskeletal: Normal range of motion.  Neurological: He is alert and oriented to person, place, and time. He has normal strength. No cranial nerve deficit or sensory deficit. Coordination normal. GCS eye subscore is 4. GCS verbal subscore is 5. GCS motor subscore is 6.  Skin: Skin is warm, dry and intact. No rash noted. No cyanosis.  Psychiatric: He has a normal mood and affect. His speech is normal and behavior is normal. Thought content normal.  Nursing note and vitals reviewed.    ED Treatments / Results  Labs (all labs ordered are listed, but only abnormal results are displayed) Labs Reviewed  BASIC METABOLIC PANEL - Abnormal; Notable for the following components:      Result Value   Glucose, Bld 119 (*)    All other components within normal limits  CBC - Abnormal; Notable for the following components:   RBC 4.05 (*)    Hemoglobin 12.0 (*)    HCT 37.5 (*)    All other components within normal limits  TROPONIN I    EKG  EKG Interpretation  Date/Time:  Saturday February 15 2018 21:49:25 EST Ventricular Rate:  127 PR Interval:    QRS Duration: 88 QT Interval:  317 QTC Calculation: 445 R Axis:   -21 Text Interpretation:  Atrial fibrillation Borderline left axis deviation Low voltage, precordial leads RSR' in V1 or V2, right VCD or  [place] Baseline wander in lead(s) V4 V5 Confirmed by Orpah Greek [phone]) on 02/15/2018 11:01:54 PM       Radiology Dg Chest 2 View  Result Date: 02/15/2018 CLINICAL DATA:  Atrial fibrillation. The patient feels as if his heart is racing. EXAM: CHEST  2 VIEW COMPARISON:  PA and lateral chest 10/09/2017.  CT chest 07/23/2017. FINDINGS: Mild left basilar atelectasis is seen. No pneumothorax or pleural effusion. No pulmonary edema. Heart size is normal. Spinal stimulator is in place. IMPRESSION: No acute disease. Electronically Signed   By: Inge Rise M.D.   On: 02/15/2018 22:52    Procedures Procedures (including critical care time)  Medications Ordered in ED Medications  diltiazem (CARDIZEM) 1 mg/mL load via infusion 10 mg (10 mg Intravenous Bolus from Bag 02/16/18 0014)    And  diltiazem (CARDIZEM) 100 mg in dextrose 5% 144mL (1 mg/mL) infusion (5 mg/hr Intravenous New Bag/Given 02/16/18 0016)     Initial Impression / Assessment and Plan / ED Course  I have reviewed the triage vital signs and the nursing notes.  Pertinent labs & imaging results that were available during my care of the patient were reviewed by me and considered in my medical decision making (see chart for details).     Patient presents with palpitations.  He has a history of paroxysmal atrial fibrillation.  Patient reported that his heart rate went up to 150 tonight and he knew he was back in A. fib, came to the ER.  Patient was in A. fib with a rapid ventricular response on arrival.  He is on Eliquis and has not missed any doses.  I did discuss with him cardioversion versus Cardizem.  It appears that he has done well with rate control in the past.  He was hesitant to perform cardioversion.  I opted to initiate him on Cardizem and monitor and if he cannot control his rate or he did not convert to sinus rhythm, we would then consider DC cardioversion.  Patient was given a bolus and placed on a drip and after only a  few minutes converted to sinus rhythm.  This would be a reasonable protocol for him going forward.  I have, however, discussed with him that he needs to follow-up with cardiology and have a referral to EP to be study for possible ablation as he has had multiple episodes of paroxysmall A. fib now.  He is stable for discharge.  Reviewing patient's records, it appears that he is taking short acting Cardizem 60 mg tablets only twice a day.  His episodes generally occur at night around 9 PM when he is taking his nighttime dose.  He might benefit from long-acting Cardizem or increasing to 3 times a day.  I will give him a prescription for Cardizem 120 daily.  Final Clinical Impressions(s) / ED Diagnoses   Final diagnoses:  Atrial fibrillation with rapid ventricular response [facility])    ED Discharge Orders    None     CRITICAL CARE Performed by: Orpah Greek.   Total critical care time: 30 minutes  Critical care time was exclusive of separately billable procedures and treating other patients.  Critical care was necessary to treat or prevent imminent or life-threatening deterioration.  Critical care was time spent personally by me on the following activities: development of treatment plan with patient and/or surrogate as well as nursing, discussions with consultants, evaluation of patient's response to treatment, examination of patient, obtaining history from patient or surrogate, ordering and  performing treatments and interventions, ordering and review of laboratory studies, ordering and review of radiographic studies, pulse oximetry and re-evaluation of patient's condition.     Orpah Greek, MD 02/16/18 0140    Orpah Greek, MD 02/16/18 3559    Orpah Greek, MD 03/19/18 [phone]

## 2018-02-15 NOTE — ED Triage Notes (Signed)
Pt with hx of a fib with RVR  Feels he is in a fib with his pulse racing

## 2018-02-16 MED ORDER — DILTIAZEM HCL ER COATED BEADS 120 MG PO CP24: 120.0000 mg | ORAL_CAPSULE | Freq: Every day | ORAL | 0 refills | Status: DC

## 2018-03-03 NOTE — Progress Notes (Signed)
Cardiology Office Note   Date:  03/05/2018   ID:  Christopher [name], Christopher [name] 03-30-1946, MRN [id number]  PCP:  [name], MD  Cardiologist:  Andrez Grime chief complaint on file.    History of Present Illness:  72 y.o. history of PAF. Initially diagnosed August 2018 after left TKR started on eliquis for this and DVT had recurrence 10/09/17 converted with more cardizem.  No history of CAD, CHF or syncope Compliant with meds Echo 07/23/17 reviewed EF 55-60% only mild LAE no valve disease Seen in ER again with PAF 02/15/18 Converted with cardizem. Needs to have AAT or ablative consideration   He actually indicates having more than 4 recurrences PAF but only goes to ER when rates are over 120 bpm Compliant with meds. Wife wants Thyroid checked as he also has multiple somatic complaints including fatigue Dry skin and weight gain    Past Medical History:  Diagnosis Date  . A-fib ([place])   . Arthritis    "all over my body" (06/17/2017)  . BPH (benign prostatic hyperplasia)    takes Proscar daily  . Chronic back pain    DDD; "upper and lower" (06/17/2017)  . Constipation    takes Colace daily  . DDD (degenerative disc disease), cervical   . DVT (deep venous thrombosis) ([facility])    left peroneal vein 07/2017 (in setting of recent left TKA)  . Dysrhythmia    history of Atrial fibrillation, no long has this  . Enlarged prostate   . GERD (gastroesophageal reflux disease)    takes Nexium daily  . H/O hiatal hernia   . History of colon polyps    benign  . History of gastric ulcer   . Insomnia    takes Xanax nightly  . Joint capsule tear    left knee  . Joint pain   . Joint swelling   . Macular degeneration   . Muscle spasm of back    takes Valium daily as needed  . PAF (paroxysmal atrial fibrillation) ([facility])   . Peripheral neuropathy    takes Gabapentin daily  . PONV (postoperative nausea and vomiting)   . Sleep apnea    pt does not wear a cpap  . Urinary hesitancy   . Weakness    numbness and tingling    Past Surgical History:  Procedure Laterality Date  . ANTERIOR CERVICAL DECOMP/DISCECTOMY FUSION  ~ 1991   "took bone from hip"  . ANTERIOR CERVICAL DECOMP/DISCECTOMY FUSION  X 2   "put plate in"  . ANTERIOR CERVICAL DISCECTOMY  <11/2005    5-6 and 4-5./notes 05/02/2011  . APPENDECTOMY    . BACK SURGERY    . CARDIAC CATHETERIZATION    . CARPAL TUNNEL RELEASE Bilateral 10/2005 - 11/2005   right-left Archie Endo 05/02/2011  . CATARACT EXTRACTION W/PHACO Right 05/24/2014   Procedure: CATARACT EXTRACTION PHACO AND INTRAOCULAR LENS PLACEMENT (IOC);  Surgeon: [name], MD;  Location: [facility];  Service: Ophthalmology;  Laterality: Right;  CDE 6.74  . CATARACT EXTRACTION W/PHACO Left 06/17/2014   Procedure: CATARACT EXTRACTION PHACO AND INTRAOCULAR LENS PLACEMENT (IOC);  Surgeon: [name], MD;  Location: [facility];  Service: Ophthalmology;  Laterality: Left;  CDE:4.65  . COLONOSCOPY WITH ESOPHAGOGASTRODUODENOSCOPY (EGD) N/A 01/22/2013   Procedure: COLONOSCOPY WITH ESOPHAGOGASTRODUODENOSCOPY (EGD);  Surgeon: [name], MD;  [facility];  Service: Endoscopy;  Laterality: N/A;  140  . EYE SURGERY Bilateral    "cleaned film w/laser; since my 1st cataract OR"  .  JOINT REPLACEMENT    . POSTERIOR LUMBAR FUSION     "put a plate in"  . QUADRICEPS TENDON REPAIR Left 07/01/2017  . QUADRICEPS TENDON REPAIR Left 07/01/2017   Procedure: REPAIR QUADRICEP TENDON;  Surgeon: [name], MD;  Location: [place];  Service: Orthopedics;  Laterality: Left;  . SHOULDER ARTHROSCOPY Bilateral   . SPINAL CORD STIMULATOR INSERTION N/A 09/23/2015   Procedure: LUMBAR SPINAL CORD STIMULATOR INSERTION;  Surgeon: [name], MD;  Location: [facility];  Service: Neurosurgery;  Laterality: N/A;  LUMBAR SPINAL CORD STIMULATOR INSERTION  . TOTAL KNEE ARTHROPLASTY Left 06/17/2017  . TOTAL KNEE ARTHROPLASTY Left 06/17/2017   Procedure: TOTAL KNEE ARTHROPLASTY;  Surgeon: [name], MD;  Location:  [place];  Service: Orthopedics;  Laterality: Left;  . TOTAL KNEE ARTHROPLASTY Right 12/16/2017   Procedure: TOTAL KNEE ARTHROPLASTY;  Surgeon: [name], MD;  Location: [place];  Service: Orthopedics;  Laterality: Right;     Current Outpatient Medications  Medication Sig Dispense Refill  . alfuzosin (UROXATRAL) 10 MG 24 hr tablet Take 10 mg by mouth daily.    [name] ALPRAZolam (XANAX) 1 MG tablet Take 1 mg by mouth at bedtime.  0  . apixaban (ELIQUIS) 5 MG TABS tablet Take 5 mg by mouth 2 (two) times daily.    . baclofen (LIORESAL) 10 MG tablet Take 1 tablet (10 mg total) by mouth 3 (three) times daily. 60 each 0  . Cholecalciferol (VITAMIN D3) 2000 units capsule Take 2,000 Units by mouth daily.    [name] diltiazem (CARDIZEM CD) 120 MG 24 hr capsule Take 1 capsule (120 mg total) by mouth daily. 30 capsule 0  . docusate sodium (COLACE) 100 MG capsule Take 200 mg by mouth daily.    [name] gabapentin (NEURONTIN) 400 MG capsule Take 800 mg by mouth daily.   2  . HYDROcodone-acetaminophen (NORCO) 10-325 MG tablet Take 1 tablet by mouth 3 (three) times daily as needed for moderate pain.    . magnesium oxide (MAG-OX) 400 MG tablet Take 200 mg by mouth daily.    . naproxen (NAPROSYN) 500 MG tablet Take 500 mg by mouth 2 (two) times daily.  11  . Omega-3 Fatty Acids (FISH OIL) 1000 MG CAPS Take 1,000 mg by mouth daily.     . pantoprazole (PROTONIX) 40 MG tablet Take 1 tablet by mouth daily.    . Probiotic CAPS Take 1 capsule by mouth daily.    . tamsulosin (FLOMAX) 0.4 MG CAPS capsule Take 0.4 mg by mouth daily.    . vitamin B-12 (CYANOCOBALAMIN) 1000 MCG tablet Take 1,000 mcg by mouth daily.     No current facility-administered medications for this visit.     Allergies:   Morphine and related and Propofol    Social History:  The patient  reports that he quit smoking about 31 years ago. His smoking use included cigarettes. He has a 30.00 pack-year smoking history. He has quit using smokeless tobacco. His  smokeless tobacco use included chew. He reports that he drinks about 12.6 oz of alcohol per week. He reports that he does not use drugs.   Family History:  The patient's family history is not on file.    ROS: All other systems are reviewed and negative. Unless otherwise mentioned in H&P    PHYSICAL EXAM: VS:  There were no vitals taken for this visit. , BMI There is no height or weight on file to calculate BMI. Affect appropriate Healthy:  appears stated age 15:  normal Neck supple with no adenopathy JVP normal no bruits no thyromegaly Lungs clear with no wheezing and good diaphragmatic motion Heart:  S1/S2 no murmur, no rub, gallop or click PMI normal Abdomen: benighn, BS positve, no tenderness, no AAA no bruit.  No HSM or HJR Distal pulses intact with no bruits Left TKR  Neuro non-focal Skin warm and dry No muscular weakness    Recent Labs: 10/09/2017: Magnesium 2.0 11/04/2017: TSH 2.287 12/06/2017: ALT 18 02/15/2018: BUN 16; Creatinine, Ser 0.94; Hemoglobin 12.0; Platelets 236; Potassium 3.5; Sodium 141    Lipid Panel    Component Value Date/Time   CHOL 193 11/04/2017 1120   TRIG 72 11/04/2017 1120   HDL 52 11/04/2017 1120   CHOLHDL 3.7 11/04/2017 1120   VLDL 14 11/04/2017 1120   LDLCALC 127 (H) 11/04/2017 1120      Wt Readings from Last 3 Encounters:  02/15/18 164 lb (74.4 kg)  12/16/17 168 lb (76.2 kg)  12/06/17 169 lb (76.7 kg)     Echocardiogram 07/23/2017 Left ventricle: The cavity size was normal. Systolic function was   normal. The estimated ejection fraction was in the range of 55%   to 60%. Wall motion was normal; there were no regional wall   motion abnormalities. Left ventricular diastolic function   parameters were normal. - Left atrium: The atrium was mildly dilated. - Atrial septum: A patent foramen ovale cannot be excluded.    ASSESSMENT AND PLAN:  1.  Paroxysmal atrial fibrillation: Multiple recurrences on  Cardizem and eliquis Start  flecainide 75 bid ETT in 2 weeks F/u with [facility] in [place] can discuss ablation if AAT fails   2.  GERD: continue protonix low carb diet and attemps at weight loss   3. DVT: left peroneal post TKR August 2018 on eliquis for PAF   4. Constitutional:  Check TSH/T4 f/u primary    Jenkins Rouge

## 2018-03-05 ENCOUNTER — Other Ambulatory Visit (HOSPITAL_COMMUNITY)
Admission: RE | Admit: 2018-03-05 | Discharge: 2018-03-05 | Disposition: A | Discharge status: Home / Self Care | Payer: Medicare Other | Source: Ambulatory Visit | Attending: Cardiovascular Disease | Admitting: Cardiovascular Disease

## 2018-03-05 ENCOUNTER — Ambulatory Visit: Payer: Medicare Other | Admitting: Cardiovascular Disease

## 2018-03-05 ENCOUNTER — Encounter: Payer: Self-pay | Admitting: Cardiovascular Disease

## 2018-03-05 VITALS — BP 122/72 | HR 77 | Ht 68.0 in | Wt 170.0 lb

## 2018-03-05 DIAGNOSIS — Z79899 Other long term (current) drug therapy: Secondary | ICD-10-CM | POA: Insufficient documentation

## 2018-03-05 DIAGNOSIS — I480 Paroxysmal atrial fibrillation: Secondary | ICD-10-CM

## 2018-03-05 LAB — BASIC METABOLIC PANEL
Anion gap: 11 (ref 5–15)
BUN: 16 mg/dL (ref 6–20)
CO2: 22 mmol/L (ref 22–32)
Calcium: 9.7 mg/dL (ref 8.9–10.3)
Chloride: 104 mmol/L (ref 101–111)
Creatinine, Ser: 0.82 mg/dL (ref 0.61–1.24)
GFR calc Af Amer: >60 mL/min (ref >60)
GFR calc non Af Amer: >60 mL/min (ref >60)
Glucose, Bld: 98 mg/dL (ref 65–99)
Potassium: 4 mmol/L (ref 3.5–5.1)
Sodium: 137 mmol/L (ref 135–145)

## 2018-03-05 LAB — TSH: TSH: 2.035 u[IU]/mL (ref 0.350–4.500)

## 2018-03-05 MED ORDER — FLECAINIDE ACETATE 50 MG PO TABS: 75.0000 mg | ORAL_TABLET | Freq: Two times a day (BID) | ORAL | 6 refills | Status: DC

## 2018-03-05 NOTE — Patient Instructions (Signed)
Medication Instructions:  START FLECAINIDE 75 MG TWO TIMES DAILY    Labwork: TODAY  Testing/Procedures: Your physician has requested that you have an exercise tolerance test. For further information please visit [email or web address]. Please also follow instruction sheet, as given.    Follow-Up: Your physician recommends that you schedule a follow-up appointment in: DR. Lovena Le    Any Other Special Instructions Will Be Listed Below (If Applicable).     If you need a refill on your cardiac medications before your next appointment, please call your pharmacy.

## 2018-03-06 ENCOUNTER — Other Ambulatory Visit (HOSPITAL_COMMUNITY)
Admission: RE | Admit: 2018-03-06 | Discharge: 2018-03-06 | Disposition: A | Discharge status: Home / Self Care | Payer: Medicare Other | Source: Ambulatory Visit | Attending: Cardiovascular Disease | Admitting: Cardiovascular Disease

## 2018-03-06 DIAGNOSIS — Z79899 Other long term (current) drug therapy: Secondary | ICD-10-CM | POA: Insufficient documentation

## 2018-03-06 LAB — T4, FREE: Free T4: 0.83 ng/dL (ref 0.61–1.12)

## 2018-03-19 ENCOUNTER — Ambulatory Visit (HOSPITAL_COMMUNITY)
Admission: RE | Admit: 2018-03-19 | Discharge: 2018-03-19 | Disposition: A | Discharge status: Home / Self Care | Payer: Medicare Other | Source: Ambulatory Visit | Attending: Cardiovascular Disease | Admitting: Cardiovascular Disease

## 2018-03-19 DIAGNOSIS — M79604 Pain in right leg: Secondary | ICD-10-CM | POA: Insufficient documentation

## 2018-03-19 DIAGNOSIS — M79605 Pain in left leg: Secondary | ICD-10-CM | POA: Diagnosis not present

## 2018-03-19 DIAGNOSIS — Z79899 Other long term (current) drug therapy: Secondary | ICD-10-CM | POA: Diagnosis not present

## 2018-03-19 DIAGNOSIS — I480 Paroxysmal atrial fibrillation: Secondary | ICD-10-CM | POA: Diagnosis not present

## 2018-03-19 DIAGNOSIS — I10 Essential (primary) hypertension: Secondary | ICD-10-CM | POA: Diagnosis not present

## 2018-03-19 LAB — EXERCISE TOLERANCE TEST
Estimated workload: 8.6 METS
Exercise duration (min): 10 min
Exercise duration (sec): 31 s
MPHR: 147 {beats}/min
Peak HR: 126 {beats}/min
Percent HR: 85 %
RPE: 13
Rest HR: 66 {beats}/min

## 2018-03-25 ENCOUNTER — Encounter: Payer: Self-pay | Admitting: Internal Medicine

## 2018-03-28 ENCOUNTER — Telehealth: Payer: Self-pay

## 2018-03-28 ENCOUNTER — Encounter: Payer: Self-pay | Admitting: Internal Medicine

## 2018-03-28 ENCOUNTER — Ambulatory Visit: Payer: Medicare Other | Admitting: Internal Medicine

## 2018-03-28 VITALS — BP 122/60 | HR 60 | Ht 67.0 in | Wt 167.0 lb

## 2018-03-28 DIAGNOSIS — I480 Paroxysmal atrial fibrillation: Secondary | ICD-10-CM

## 2018-03-28 MED ORDER — PROPAFENONE HCL ER 325 MG PO CP12: 325.0000 mg | ORAL_CAPSULE | Freq: Two times a day (BID) | ORAL | 11 refills | Status: DC

## 2018-03-28 MED ORDER — PROPAFENONE HCL 225 MG PO TABS: 225.0000 mg | ORAL_TABLET | Freq: Two times a day (BID) | ORAL | 11 refills | Status: DC

## 2018-03-28 NOTE — Telephone Encounter (Signed)
Patients girlfriend, Lupita Leash (listed on DPR) called asking is there a different medication that the patient can switch to due to cost of Rythmol 325 mg that he was put on today. Call back number [phone]. Please advise.

## 2018-03-28 NOTE — Progress Notes (Signed)
HPI [name] is referred today by Dr. Johnsie Cancel for evaluation of PAF. He is a pleasant 72 yo man with a h/o HTN who developed PAF around the time of knee replacement. He has been placed on flecainide by Dr. Johnsie Cancel and his PAF has improved. Unfortunately he has developed fairly severe GERD symptoms which persist despite protonix. His atrial fib has improved. No syncope. He has persistent palpitations. Allergies  Allergen Reactions  . Morphine And Related Nausea And Vomiting  . Propofol Nausea And Vomiting     Current Outpatient Medications  Medication Sig Dispense Refill  . alfuzosin (UROXATRAL) 10 MG 24 hr tablet Take 10 mg by mouth daily.    [name] ALPRAZolam (XANAX) 1 MG tablet Take 1 mg by mouth at bedtime.  0  . apixaban (ELIQUIS) 5 MG TABS tablet Take 5 mg by mouth 2 (two) times daily.    . baclofen (LIORESAL) 10 MG tablet Take 1 tablet (10 mg total) by mouth 3 (three) times daily. 60 each 0  . Cholecalciferol (VITAMIN D3) 2000 units capsule Take 2,000 Units by mouth daily.    [name] diltiazem (CARDIZEM CD) 120 MG 24 hr capsule Take 1 capsule (120 mg total) by mouth daily. 30 capsule 0  . docusate sodium (COLACE) 100 MG capsule Take 200 mg by mouth daily.    . flecainide (TAMBOCOR) 50 MG tablet Take 1.5 tablets (75 mg total) by mouth 2 (two) times daily. 45 tablet 6  . gabapentin (NEURONTIN) 400 MG capsule Take 800 mg by mouth daily.   2  . HYDROcodone-acetaminophen (NORCO) 10-325 MG tablet Take 1 tablet by mouth 3 (three) times daily as needed for moderate pain.    . magnesium oxide (MAG-OX) 400 MG tablet Take 200 mg by mouth daily.    . naproxen (NAPROSYN) 500 MG tablet Take 500 mg by mouth 2 (two) times daily.  11  . Omega-3 Fatty Acids (FISH OIL) 1000 MG CAPS Take 1,000 mg by mouth daily.     . pantoprazole (PROTONIX) 40 MG tablet Take 1 tablet by mouth daily.    . Probiotic CAPS Take 1 capsule by mouth daily.    . vitamin B-12 (CYANOCOBALAMIN) 1000 MCG tablet Take 1,000 mcg by mouth  daily.     No current facility-administered medications for this visit.      Past Medical History:  Diagnosis Date  . A-fib ([place])   . Arthritis    "all over my body" (06/17/2017)  . BPH (benign prostatic hyperplasia)    takes Proscar daily  . Chronic back pain    DDD; "upper and lower" (06/17/2017)  . Constipation    takes Colace daily  . DDD (degenerative disc disease), cervical   . DVT (deep venous thrombosis) ([facility])    left peroneal vein 07/2017 (in setting of recent left TKA)  . Dysrhythmia    history of Atrial fibrillation, no long has this  . Enlarged prostate   . GERD (gastroesophageal reflux disease)    takes Nexium daily  . H/O hiatal hernia   . History of colon polyps    benign  . History of gastric ulcer   . Insomnia    takes Xanax nightly  . Joint capsule tear    left knee  . Joint pain   . Joint swelling   . Macular degeneration   . Muscle spasm of back    takes Valium daily as needed  . PAF (paroxysmal atrial fibrillation) ([place])   .  Peripheral neuropathy    takes Gabapentin daily  . PONV (postoperative nausea and vomiting)   . Sleep apnea    pt does not wear a cpap  . Urinary hesitancy   . Weakness    numbness and tingling    ROS:   All systems reviewed and negative except as noted in the HPI.   Past Surgical History:  Procedure Laterality Date  . ANTERIOR CERVICAL DECOMP/DISCECTOMY FUSION  ~ 1991   "took bone from hip"  . ANTERIOR CERVICAL DECOMP/DISCECTOMY FUSION  X 2   "put plate in"  . ANTERIOR CERVICAL DISCECTOMY  <11/2005    5-6 and 4-5./notes 05/02/2011  . APPENDECTOMY    . BACK SURGERY    . CARDIAC CATHETERIZATION    . CARPAL TUNNEL RELEASE Bilateral 10/2005 - 11/2005   right-left Archie Endo 05/02/2011  . CATARACT EXTRACTION W/PHACO Right 05/24/2014   Procedure: CATARACT EXTRACTION PHACO AND INTRAOCULAR LENS PLACEMENT (IOC);  Surgeon: [name], MD;  Location: [facility];  Service: Ophthalmology;  Laterality: Right;  CDE 6.74  . CATARACT  EXTRACTION W/PHACO Left 06/17/2014   Procedure: CATARACT EXTRACTION PHACO AND INTRAOCULAR LENS PLACEMENT (IOC);  Surgeon: [name], MD;  Location: [facility];  Service: Ophthalmology;  Laterality: Left;  CDE:4.65  . COLONOSCOPY WITH ESOPHAGOGASTRODUODENOSCOPY (EGD) N/A 01/22/2013   Procedure: COLONOSCOPY WITH ESOPHAGOGASTRODUODENOSCOPY (EGD);  Surgeon: [name], MD;  [facility];  Service: Endoscopy;  Laterality: N/A;  140  . EYE SURGERY Bilateral    "cleaned film w/laser; since my 1st cataract OR"  . JOINT REPLACEMENT    . POSTERIOR LUMBAR FUSION     "put a plate in"  . QUADRICEPS TENDON REPAIR Left 07/01/2017  . QUADRICEPS TENDON REPAIR Left 07/01/2017   Procedure: REPAIR QUADRICEP TENDON;  Surgeon: [name], MD;  Location: [place];  Service: Orthopedics;  Laterality: Left;  . SHOULDER ARTHROSCOPY Bilateral   . SPINAL CORD STIMULATOR INSERTION N/A 09/23/2015   Procedure: LUMBAR SPINAL CORD STIMULATOR INSERTION;  Surgeon: [name], MD;  Location: [place] [facility];  Service: Neurosurgery;  Laterality: N/A;  LUMBAR SPINAL CORD STIMULATOR INSERTION  . TOTAL KNEE ARTHROPLASTY Left 06/17/2017  . TOTAL KNEE ARTHROPLASTY Left 06/17/2017   Procedure: TOTAL KNEE ARTHROPLASTY;  Surgeon: [name], MD;  Location: [facility];  Service: Orthopedics;  Laterality: Left;  . TOTAL KNEE ARTHROPLASTY Right 12/16/2017   Procedure: TOTAL KNEE ARTHROPLASTY;  Surgeon: [name], MD;  Location: [place];  Service: Orthopedics;  Laterality: Right;     No family history on file.   Social History   Socioeconomic History  . Marital status: Significant Other    Spouse name: Not on file  . Number of children: Not on file  . Years of education: Not on file  . Highest education level: Not on file  Occupational History  . Not on file  Social Needs  . Financial resource strain: Not on file  . Food insecurity:    Worry: Not on file    Inability: Not on file  . Transportation needs:    Medical: Not  on file    Non-medical: Not on file  Tobacco Use  . Smoking status: Former Smoker    Packs/day: 1.00    Years: 30.00    Pack years: 30.00    Types: Cigarettes    Last attempt to quit: 01/14/1987    Years since quitting: 31.2  . Smokeless tobacco: Former Systems developer    Types: Chew  . Tobacco comment: quit smoking  &  chewing by 1987  Substance and Sexual Activity  . Alcohol use: Yes    Alcohol/week: 9.0 oz    Types: 12 Cans of beer, 3 Glasses of wine per week    Comment: 2-3 times weekly   . Drug use: No  . Sexual activity: Not Currently  Lifestyle  . Physical activity:    Days per week: Not on file    Minutes per session: Not on file  . Stress: Not on file  Relationships  . Social connections:    Talks on phone: Not on file    Gets together: Not on file    Attends religious service: Not on file    Active member of club or organization: Not on file    Attends meetings of clubs or organizations: Not on file    Relationship status: Not on file  . Intimate partner violence:    Fear of current or ex partner: Not on file    Emotionally abused: Not on file    Physically abused: Not on file    Forced sexual activity: Not on file  Other Topics Concern  . Not on file  Social History Narrative  . Not on file     BP 122/60   Pulse 60   Ht 5\' 7"  (1.702 m)   Wt 167 lb (75.8 kg)   BMI 26.16 kg/m   Physical Exam:  Well appearing 72 yo man, NAD HEENT: Unremarkable Neck:  6 cm JVD, no thyromegally Lymphatics:  No adenopathy Back:  No CVA tenderness Lungs:  Clear with no wheezes HEART:  Regular rate rhythm, no murmurs, no rubs, no clicks Abd:  soft, positive bowel sounds, no organomegally, no rebound, no guarding Ext:  2 plus pulses, no edema, no cyanosis, no clubbing Skin:  No rashes no nodules Neuro:  CN II through XII intact, motor grossly intact  EKG - NSR with IRBBB with a QRS of 114   Assess/Plan: 1. PAF - he is maintaining NSR mostly but has had side effects from the  flecainide. I have recommended switching to rhythmol 325 bid. We will check a 12 lead ECG in a week and schedule a regular treadmill stress test. 2. GERD - his symptoms are still present on flecainided. Hopefully they will all resolve on propafenone. 3. DVT - he will remain on eliquis.  Mikle Bosworth.D.

## 2018-03-28 NOTE — Telephone Encounter (Signed)
Returned call to Pt's girlfriend.  Notified Rhythmol SR was copay $97 dollars a month and cannot afford.   Per Dr. Lovena Le ok to change to standard propafenone.  Per [facility] that is $16 a month. Notified Pt.  $16 ok.  [facility] will order and have available for Monday.   Pt indicates understanding.  No further action needed.

## 2018-03-28 NOTE — Patient Instructions (Addendum)
Medication Instructions:  Your physician has recommended you make the following change in your medication:  1.  Stop taking your flecainide on Saturday night (do not take it at bedtime) 2.  On Sunday you will not take flecainide or rhythmol.  If you have a fast heart rate that bothers you take an extra diltiazem. 3.  On Monday start taking Rhythmol (propafenone) 325 mg one capsule by mouth twice a day.  Labwork: None ordered.  Testing/Procedures: Go to the [facility] office in 2 weeks for a nurse visit for a 12 lead EKG.  Follow-Up: Your physician wants you to follow-up in:4-5 weeks with Dr. Lovena Le in [place].  Any Other Special Instructions Will Be Listed Below (If Applicable).  If you need a refill on your cardiac medications before your next appointment, please call your pharmacy.

## 2018-04-10 ENCOUNTER — Encounter (INDEPENDENT_AMBULATORY_CARE_PROVIDER_SITE_OTHER): Payer: Self-pay | Admitting: Internal Medicine

## 2018-04-10 ENCOUNTER — Telehealth (INDEPENDENT_AMBULATORY_CARE_PROVIDER_SITE_OTHER): Payer: Self-pay | Admitting: *Deleted

## 2018-04-10 ENCOUNTER — Encounter (INDEPENDENT_AMBULATORY_CARE_PROVIDER_SITE_OTHER): Payer: Self-pay | Admitting: *Deleted

## 2018-04-10 ENCOUNTER — Ambulatory Visit (INDEPENDENT_AMBULATORY_CARE_PROVIDER_SITE_OTHER): Payer: Medicare Other | Admitting: Internal Medicine

## 2018-04-10 VITALS — BP 130/80 | HR 64 | Temp 97.5°F | Ht 68.0 in | Wt 164.6 lb

## 2018-04-10 DIAGNOSIS — K219 Gastro-esophageal reflux disease without esophagitis: Secondary | ICD-10-CM

## 2018-04-10 DIAGNOSIS — K2270 Barrett's esophagus without dysplasia: Secondary | ICD-10-CM | POA: Diagnosis not present

## 2018-04-10 DIAGNOSIS — Z86010 Personal history of colonic polyps: Secondary | ICD-10-CM

## 2018-04-10 MED ORDER — PEG 3350-KCL-NA BICARB-NACL 420 G PO SOLR: 4000.0000 mL | Freq: Once | ORAL | 0 refills | Status: AC

## 2018-04-10 NOTE — Telephone Encounter (Signed)
Patient needs trilyte 

## 2018-04-10 NOTE — Telephone Encounter (Signed)
Patient is scheduled for colonoscopy & endodcopy 05/29/18 -- he needs to stop ELiquis 2 days prior -- please advise if ok to stop

## 2018-04-10 NOTE — Progress Notes (Signed)
Subjective:    Patient ID: Christopher [name], male    DOB: 1946-04-11, 72 y.o.   MRN: [id number]  HPI Referred by Dr. Cindie Laroche for GERD. He say after starting Flecainide  he began to have terrible acid reflux. Symptoms x 1 1/2 weeks. Medication was changed to Propafenone and his symptoms resolved for the most part.    He says now he is not having acid reflux. He says if he eats [place] or spaghetti he does not having any problems. Takes Protonix for his GERD. His appetite is good for the most part. No weight loss. Has a BM every day and sometimes it will be every 3 days. No melena or BRRB.  Hx of Barrett's and colon polyps.  Underwent and EGD/Colonoscopy in 2014. [name] Hx of epigastric pain and weight loss.  Screening colonoscopy Impression:  EGD findings. Serrated GE junction along with small patch of salmon-colored mucosa proximal to GEJ. Biopsy taken from this area to rule out short segment Barrett,s. Erosive antral gastritis. Colonoscopy findings. Small cecal diverticulum. Two small polyps ablated via cold biopsy and submitted together as above. Small external hemorrhoids. Biopsy: Barrett's esophagus.  H. Pylori positive. Treated with Pylera Colon: Tubular adenoma, Sessile serrated polyp. NO high grade dysplasia identified.    Hx of atrial fib and maintained on Eliquis.   Review of Systems  Past Medical History:  Diagnosis Date  . A-fib ([place])   . Arthritis    "all over my body" (06/17/2017)  . BPH (benign prostatic hyperplasia)    takes Proscar daily  . Chronic back pain    DDD; "upper and lower" (06/17/2017)  . Constipation    takes Colace daily  . DDD (degenerative disc disease), cervical   . DVT (deep venous thrombosis) ([facility])    left peroneal vein 07/2017 (in setting of recent left TKA)  . Dysrhythmia    history of Atrial fibrillation, no long has this  . Enlarged prostate   . GERD (gastroesophageal reflux disease)    takes Nexium daily  . H/O hiatal hernia   . History of  colon polyps    benign  . History of gastric ulcer   . Insomnia    takes Xanax nightly  . Joint capsule tear    left knee  . Joint pain   . Joint swelling   . Macular degeneration   . Muscle spasm of back    takes Valium daily as needed  . PAF (paroxysmal atrial fibrillation) ([place])   . Peripheral neuropathy    takes Gabapentin daily  . PONV (postoperative nausea and vomiting)   . Sleep apnea    pt does not wear a cpap  . Urinary hesitancy   . Weakness    numbness and tingling    Past Surgical History:  Procedure Laterality Date  . ANTERIOR CERVICAL DECOMP/DISCECTOMY FUSION  ~ 1991   "took bone from hip"  . ANTERIOR CERVICAL DECOMP/DISCECTOMY FUSION  X 2   "put plate in"  . ANTERIOR CERVICAL DISCECTOMY  <11/2005    5-6 and 4-5./notes 05/02/2011  . APPENDECTOMY    . BACK SURGERY    . CARDIAC CATHETERIZATION    . CARPAL TUNNEL RELEASE Bilateral 10/2005 - 11/2005   right-left Archie Endo 05/02/2011  . CATARACT EXTRACTION W/PHACO Right 05/24/2014   Procedure: CATARACT EXTRACTION PHACO AND INTRAOCULAR LENS PLACEMENT (IOC);  Surgeon: [name], MD;  Location: [facility];  Service: Ophthalmology;  Laterality: Right;  CDE 6.74  . CATARACT EXTRACTION W/PHACO Left 06/17/2014  Procedure: CATARACT EXTRACTION PHACO AND INTRAOCULAR LENS PLACEMENT (IOC);  Surgeon: [name], MD;  Location: [facility];  Service: Ophthalmology;  Laterality: Left;  CDE:4.65  . COLONOSCOPY WITH ESOPHAGOGASTRODUODENOSCOPY (EGD) N/A 01/22/2013   Procedure: COLONOSCOPY WITH ESOPHAGOGASTRODUODENOSCOPY (EGD);  Surgeon: [name], MD;  [facility];  Service: Endoscopy;  Laterality: N/A;  140  . EYE SURGERY Bilateral    "cleaned film w/laser; since my 1st cataract OR"  . JOINT REPLACEMENT    . POSTERIOR LUMBAR FUSION     "put a plate in"  . QUADRICEPS TENDON REPAIR Left 07/01/2017  . QUADRICEPS TENDON REPAIR Left 07/01/2017   Procedure: REPAIR QUADRICEP TENDON;  Surgeon: [name], MD;  Location: [place];   Service: Orthopedics;  Laterality: Left;  . SHOULDER ARTHROSCOPY Bilateral   . SPINAL CORD STIMULATOR INSERTION N/A 09/23/2015   Procedure: LUMBAR SPINAL CORD STIMULATOR INSERTION;  Surgeon: [name], MD;  Location: [place] [facility];  Service: Neurosurgery;  Laterality: N/A;  LUMBAR SPINAL CORD STIMULATOR INSERTION  . TOTAL KNEE ARTHROPLASTY Left 06/17/2017  . TOTAL KNEE ARTHROPLASTY Left 06/17/2017   Procedure: TOTAL KNEE ARTHROPLASTY;  Surgeon: [name], MD;  Location: [place];  Service: Orthopedics;  Laterality: Left;  . TOTAL KNEE ARTHROPLASTY Right 12/16/2017   Procedure: TOTAL KNEE ARTHROPLASTY;  Surgeon: [name], MD;  Location: [place];  Service: Orthopedics;  Laterality: Right;    Allergies  Allergen Reactions  . Morphine And Related Nausea And Vomiting  . Propofol Nausea And Vomiting    Current Outpatient Medications on File Prior to Visit  Medication Sig Dispense Refill  . alfuzosin (UROXATRAL) 10 MG 24 hr tablet Take 10 mg by mouth daily.    [name] ALPRAZolam (XANAX) 1 MG tablet Take 1 mg by mouth at bedtime.  0  . apixaban (ELIQUIS) 5 MG TABS tablet Take 5 mg by mouth 2 (two) times daily.    . baclofen (LIORESAL) 10 MG tablet Take 1 tablet (10 mg total) by mouth 3 (three) times daily. 60 each 0  . Cholecalciferol (VITAMIN D3) 2000 units capsule Take 2,000 Units by mouth daily.    [name] docusate sodium (COLACE) 100 MG capsule Take 200 mg by mouth daily.    [name] gabapentin (NEURONTIN) 400 MG capsule Take 800 mg by mouth daily.   2  . HYDROcodone-acetaminophen (NORCO) 10-325 MG tablet Take 1 tablet by mouth 3 (three) times daily as needed for moderate pain.    . magnesium oxide (MAG-OX) 400 MG tablet Take 200 mg by mouth daily.    . naproxen (NAPROSYN) 500 MG tablet Take 500 mg by mouth 2 (two) times daily.  11  . Omega-3 Fatty Acids (FISH OIL) 1000 MG CAPS Take 1,000 mg by mouth daily.     . pantoprazole (PROTONIX) 40 MG tablet Take 1 tablet by mouth daily.    . Probiotic CAPS Take 1  capsule by mouth daily.    . propafenone (RYTHMOL) 225 MG tablet Take 1 tablet (225 mg total) by mouth 2 (two) times daily. 60 tablet 11  . vitamin B-12 (CYANOCOBALAMIN) 1000 MCG tablet Take 1,000 mcg by mouth daily.     No current facility-administered medications on file prior to visit.                Objective:   Physical Exam Blood pressure 130/80, pulse 64, temperature (!) 97.5 F (36.4 C), height 5\' 8"  (1.727 m), weight 164 lb 9.6 oz (74.7 kg). Alert and oriented. Skin warm and dry.  Oral mucosa is moist. Upper and lower dentures  . Sclera anicteric, conjunctivae is pink. Thyroid not enlarged. No cervical lymphadenopathy. Lungs clear. Heart regular rate and rhythm.  Abdomen is soft. Bowel sounds are positive. No hepatomegaly. No abdominal masses felt. No tenderness.  No edema to lower extremities.          Assessment & Plan:  GERD controlled with Protonix. Is due for surveillance EGD for Barrett's. Hx of Colon polyps. Due to surveillance colonoscopy EGD/Colonscopy The risks of bleeding, perforation and infection were reviewed with patient.

## 2018-04-10 NOTE — Telephone Encounter (Signed)
Please advise since pt was just started on new antiarrhythmic.

## 2018-04-10 NOTE — Patient Instructions (Signed)
EGD/Colonoscopy. The risks of bleeding, perforation and infection were reviewed with patient.  

## 2018-04-10 NOTE — Telephone Encounter (Signed)
Ok to hold Eliquis for 2 days prior to colonoscopy/endoscopy ~2 months after recent antiarrhythmic medication change.

## 2018-04-10 NOTE — Telephone Encounter (Signed)
Patient aware.

## 2018-04-11 ENCOUNTER — Ambulatory Visit (INDEPENDENT_AMBULATORY_CARE_PROVIDER_SITE_OTHER): Payer: Medicare Other

## 2018-04-11 VITALS — BP 116/70 | HR 63

## 2018-04-11 DIAGNOSIS — I480 Paroxysmal atrial fibrillation: Secondary | ICD-10-CM | POA: Diagnosis not present

## 2018-04-11 NOTE — Patient Instructions (Signed)
Continue medications as directed.We will call you if there are any changes      Thank you for choosing [facility] !

## 2018-04-11 NOTE — Progress Notes (Signed)
Doing well on rythmol.Had one episode of HR 110 last week,reflux is gone

## 2018-04-15 DIAGNOSIS — Z86010 Personal history of colonic polyps: Secondary | ICD-10-CM | POA: Insufficient documentation

## 2018-04-15 DIAGNOSIS — K2270 Barrett's esophagus without dysplasia: Secondary | ICD-10-CM | POA: Insufficient documentation

## 2018-04-15 DIAGNOSIS — K219 Gastro-esophageal reflux disease without esophagitis: Secondary | ICD-10-CM | POA: Insufficient documentation

## 2018-04-16 DIAGNOSIS — N401 Enlarged prostate with lower urinary tract symptoms: Secondary | ICD-10-CM | POA: Diagnosis not present

## 2018-04-16 DIAGNOSIS — R361 Hematospermia: Secondary | ICD-10-CM | POA: Diagnosis not present

## 2018-04-16 DIAGNOSIS — R3911 Hesitancy of micturition: Secondary | ICD-10-CM | POA: Diagnosis not present

## 2018-04-24 ENCOUNTER — Telehealth (INDEPENDENT_AMBULATORY_CARE_PROVIDER_SITE_OTHER): Payer: Self-pay | Admitting: *Deleted

## 2018-04-24 NOTE — Telephone Encounter (Signed)
Patient sch'd for TCS/EGD 05/29/18 -- he has stimulator in his back -- will this affect anything -- please advise

## 2018-04-27 NOTE — Telephone Encounter (Signed)
It should not make any difference. Patient however needs to be scheduled with propofol.  Patient is on alprazolam and oxycodone

## 2018-04-28 ENCOUNTER — Other Ambulatory Visit (INDEPENDENT_AMBULATORY_CARE_PROVIDER_SITE_OTHER): Payer: Self-pay | Admitting: *Deleted

## 2018-04-28 DIAGNOSIS — K219 Gastro-esophageal reflux disease without esophagitis: Secondary | ICD-10-CM | POA: Insufficient documentation

## 2018-04-28 DIAGNOSIS — K2270 Barrett's esophagus without dysplasia: Secondary | ICD-10-CM

## 2018-04-28 DIAGNOSIS — Z86010 Personal history of colonic polyps: Secondary | ICD-10-CM

## 2018-04-29 ENCOUNTER — Encounter (INDEPENDENT_AMBULATORY_CARE_PROVIDER_SITE_OTHER): Payer: Self-pay | Admitting: *Deleted

## 2018-04-29 NOTE — Telephone Encounter (Signed)
TCS/EGD w propofol changed to 05/30/18, patient aware, new instructions mailed

## 2018-05-06 ENCOUNTER — Ambulatory Visit: Payer: Medicare Other | Admitting: Internal Medicine

## 2018-05-06 ENCOUNTER — Encounter: Payer: Self-pay | Admitting: Internal Medicine

## 2018-05-06 VITALS — BP 112/74 | HR 89 | Ht 68.0 in | Wt 166.0 lb

## 2018-05-06 DIAGNOSIS — I480 Paroxysmal atrial fibrillation: Secondary | ICD-10-CM

## 2018-05-06 NOTE — Progress Notes (Addendum)
HPI Christopher [name] returns today for followup of PAF. He was initially placed on flecainide but developed GERD symptoms which persisted despite medical therapy. I switched him to rhythmol. In the interim, an ECG was obtained which was unchanged. The patient notes that his reflux is almost resolved. He has had no symptomatic atrial fib. No chest pain or sob. No syncope.  Allergies  Allergen Reactions  . Morphine And Related Nausea And Vomiting  . Propofol Nausea And Vomiting     Current Outpatient Medications  Medication Sig Dispense Refill  . alfuzosin (UROXATRAL) 10 MG 24 hr tablet Take 10 mg by mouth daily.    [name] ALPRAZolam (XANAX) 1 MG tablet Take 1 mg by mouth at bedtime.  0  . apixaban (ELIQUIS) 5 MG TABS tablet Take 5 mg by mouth 2 (two) times daily.    . baclofen (LIORESAL) 10 MG tablet Take 1 tablet (10 mg total) by mouth 3 (three) times daily. 60 each 0  . Cholecalciferol (VITAMIN D3) 2000 units capsule Take 2,000 Units by mouth daily.    [name] docusate sodium (COLACE) 100 MG capsule Take 200 mg by mouth daily.    [name] gabapentin (NEURONTIN) 400 MG capsule Take 800 mg by mouth daily.   2  . HYDROcodone-acetaminophen (NORCO) 10-325 MG tablet Take 1 tablet by mouth 3 (three) times daily as needed for moderate pain.    . magnesium oxide (MAG-OX) 400 MG tablet Take 200 mg by mouth daily.    . naproxen (NAPROSYN) 500 MG tablet Take 500 mg by mouth 2 (two) times daily.  11  . Omega-3 Fatty Acids (FISH OIL) 1000 MG CAPS Take 1,000 mg by mouth daily.     . pantoprazole (PROTONIX) 40 MG tablet Take 1 tablet by mouth daily.    . Probiotic CAPS Take 1 capsule by mouth daily.    . propafenone (RYTHMOL) 225 MG tablet Take 1 tablet (225 mg total) by mouth 2 (two) times daily. 60 tablet 11  . vitamin B-12 (CYANOCOBALAMIN) 1000 MCG tablet Take 1,000 mcg by mouth daily.     No current facility-administered medications for this visit.      Past Medical History:  Diagnosis Date  . A-fib ([place])   .  Arthritis    "all over my body" (06/17/2017)  . BPH (benign prostatic hyperplasia)    takes Proscar daily  . Chronic back pain    DDD; "upper and lower" (06/17/2017)  . Constipation    takes Colace daily  . DDD (degenerative disc disease), cervical   . DVT (deep venous thrombosis) ([facility])    left peroneal vein 07/2017 (in setting of recent left TKA)  . Dysrhythmia    history of Atrial fibrillation, no long has this  . Enlarged prostate   . GERD (gastroesophageal reflux disease)    takes Nexium daily  . H/O hiatal hernia   . History of colon polyps    benign  . History of gastric ulcer   . Insomnia    takes Xanax nightly  . Joint capsule tear    left knee  . Joint pain   . Joint swelling   . Macular degeneration   . Muscle spasm of back    takes Valium daily as needed  . PAF (paroxysmal atrial fibrillation) ([place])   . Peripheral neuropathy    takes Gabapentin daily  . PONV (postoperative nausea and vomiting)   . Sleep apnea    pt does not wear a cpap  .  Urinary hesitancy   . Weakness    numbness and tingling    ROS:   All systems reviewed and negative except as noted in the HPI.   Past Surgical History:  Procedure Laterality Date  . ANTERIOR CERVICAL DECOMP/DISCECTOMY FUSION  ~ 1991   "took bone from hip"  . ANTERIOR CERVICAL DECOMP/DISCECTOMY FUSION  X 2   "put plate in"  . ANTERIOR CERVICAL DISCECTOMY  <11/2005    5-6 and 4-5./notes 05/02/2011  . APPENDECTOMY    . BACK SURGERY    . CARDIAC CATHETERIZATION    . CARPAL TUNNEL RELEASE Bilateral 10/2005 - 11/2005   right-left Archie Endo 05/02/2011  . CATARACT EXTRACTION W/PHACO Right 05/24/2014   Procedure: CATARACT EXTRACTION PHACO AND INTRAOCULAR LENS PLACEMENT (IOC);  Surgeon: [name], MD;  Location: [facility];  Service: Ophthalmology;  Laterality: Right;  CDE 6.74  . CATARACT EXTRACTION W/PHACO Left 06/17/2014   Procedure: CATARACT EXTRACTION PHACO AND INTRAOCULAR LENS PLACEMENT (IOC);  Surgeon: [name], MD;  Location:  [facility];  Service: Ophthalmology;  Laterality: Left;  CDE:4.65  . COLONOSCOPY WITH ESOPHAGOGASTRODUODENOSCOPY (EGD) N/A 01/22/2013   Procedure: COLONOSCOPY WITH ESOPHAGOGASTRODUODENOSCOPY (EGD);  Surgeon: [name], MD;  [facility];  Service: Endoscopy;  Laterality: N/A;  140  . EYE SURGERY Bilateral    "cleaned film w/laser; since my 1st cataract OR"  . JOINT REPLACEMENT    . POSTERIOR LUMBAR FUSION     "put a plate in"  . QUADRICEPS TENDON REPAIR Left 07/01/2017  . QUADRICEPS TENDON REPAIR Left 07/01/2017   Procedure: REPAIR QUADRICEP TENDON;  Surgeon: [name], MD;  Location: [place];  Service: Orthopedics;  Laterality: Left;  . SHOULDER ARTHROSCOPY Bilateral   . SPINAL CORD STIMULATOR INSERTION N/A 09/23/2015   Procedure: LUMBAR SPINAL CORD STIMULATOR INSERTION;  Surgeon: [name], MD;  Location: [place] [facility];  Service: Neurosurgery;  Laterality: N/A;  LUMBAR SPINAL CORD STIMULATOR INSERTION  . TOTAL KNEE ARTHROPLASTY Left 06/17/2017  . TOTAL KNEE ARTHROPLASTY Left 06/17/2017   Procedure: TOTAL KNEE ARTHROPLASTY;  Surgeon: [name], MD;  Location: [place];  Service: Orthopedics;  Laterality: Left;  . TOTAL KNEE ARTHROPLASTY Right 12/16/2017   Procedure: TOTAL KNEE ARTHROPLASTY;  Surgeon: [name], MD;  Location: [place];  Service: Orthopedics;  Laterality: Right;     History reviewed. No pertinent family history.   Social History   Socioeconomic History  . Marital status: Significant Other    Spouse name: Not on file  . Number of children: Not on file  . Years of education: Not on file  . Highest education level: Not on file  Occupational History  . Not on file  Social Needs  . Financial resource strain: Not on file  . Food insecurity:    Worry: Not on file    Inability: Not on file  . Transportation needs:    Medical: Not on file    Non-medical: Not on file  Tobacco Use  . Smoking status: Former Smoker    Packs/day: 1.00    Years: 30.00     Pack years: 30.00    Types: Cigarettes    Last attempt to quit: 01/14/1987    Years since quitting: 31.3  . Smokeless tobacco: Former Systems developer    Types: Chew  . Tobacco comment: quit smoking  & chewing by 1987  Substance and Sexual Activity  . Alcohol use: Yes    Alcohol/week: 9.0 oz    Types: 3 Glasses of wine, 12 Cans  of beer per week    Comment: 2-3 times weekly   . Drug use: No  . Sexual activity: Not Currently  Lifestyle  . Physical activity:    Days per week: Not on file    Minutes per session: Not on file  . Stress: Not on file  Relationships  . Social connections:    Talks on phone: Not on file    Gets together: Not on file    Attends religious service: Not on file    Active member of club or organization: Not on file    Attends meetings of clubs or organizations: Not on file    Relationship status: Not on file  . Intimate partner violence:    Fear of current or ex partner: Not on file    Emotionally abused: Not on file    Physically abused: Not on file    Forced sexual activity: Not on file  Other Topics Concern  . Not on file  Social History Narrative  . Not on file     BP 112/74 (BP Location: Right Arm)   Pulse 89   Ht 5\' 8"  (1.727 m)   Wt 166 lb (75.3 kg)   SpO2 95%   BMI 25.24 kg/m   Physical Exam:  Well appearing 72 yo man, NAD HEENT: Unremarkable Neck:  6 cm JVD, no thyromegally Lymphatics:  No adenopathy Back:  No CVA tenderness Lungs:  Clear with no wheezes HEART:  Regular rate rhythm, no murmurs, no rubs, no clicks Abd:  soft, positive bowel sounds, no organomegally, no rebound, no guarding Ext:  2 plus pulses, no edema, no cyanosis, no clubbing Skin:  No rashes no nodules Neuro:  CN II through XII intact, motor grossly intact  EKG - NSR (reviewed)   Assess/Plan: 1. PAF - he is maintaining NSR on low dose rhythmol. His ECG is ok. He will continue his current meds. If he begins to have break through of his atrial fib, then we would  uptitrated the rhythmol and have him undergo another stress test. 2. DVT - he remains on Eliquis.   Mikle [email or web address].D.

## 2018-05-06 NOTE — Patient Instructions (Signed)
Medication Instructions:  Your physician recommends that you continue on your current medications as directed. Please refer to the Current Medication list given to you today.   Labwork: NONE   Testing/Procedures: NONE   Follow-Up: Your physician wants you to follow-up in: 6 Months. You will receive a reminder letter in the mail two months in advance. If you don't receive a letter, please call our office to schedule the follow-up appointment.   Any Other Special Instructions Will Be Listed Below (If Applicable).     If you need a refill on your cardiac medications before your next appointment, please call your pharmacy.  Thank you for choosing [facility]!   

## 2018-05-11 IMAGING — CT CT ANGIO CHEST
2 of 6 series · 18 of 46 positions shown · IV contrast (Isovue)
Comparison: CT scan of March 07, 2016.

CLINICAL DATA: Shortness of breath.

EXAM:
CT ANGIOGRAPHY CHEST WITH CONTRAST
TECHNIQUE: Multidetector CT imaging of the chest was performed using the
standard protocol during bolus administration of intravenous
contrast. Multiplanar CT image reconstructions and MIPs were
obtained to evaluate the vascular anatomy.
CONTRAST:  100 mL of Isovue 370 intravenously.

[Series 5: thins · axial · 0.72mm/px · z∈[-282,-28]mm · 15 of 280 slices shown]
[im 13/280  lung]
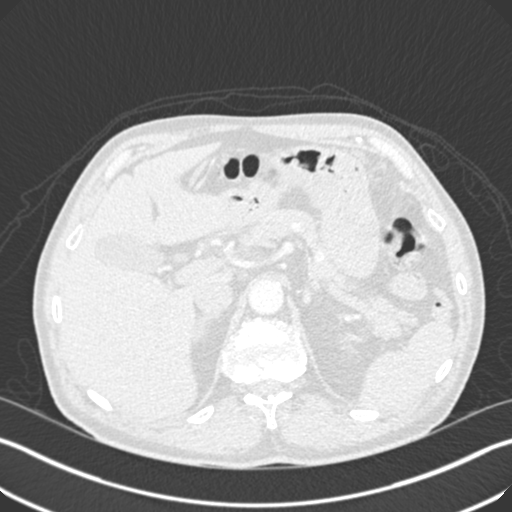
[im 37/280  soft-tissue]
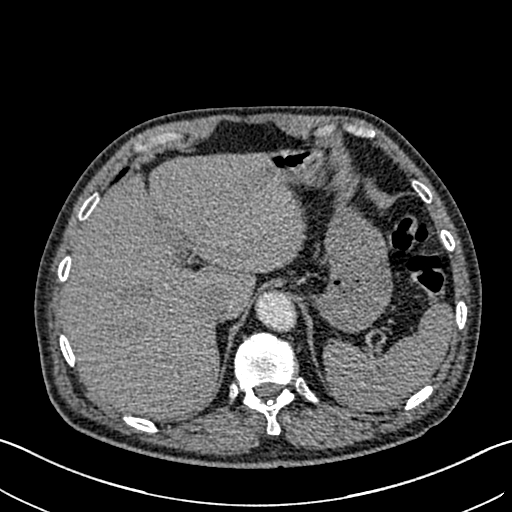
[im 49/280  lung]
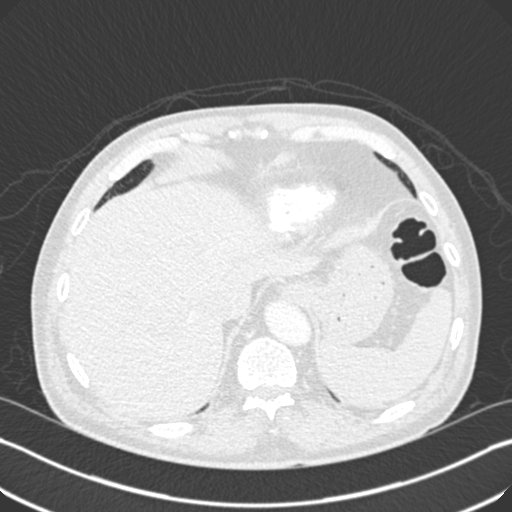
[im 73/280  soft-tissue]
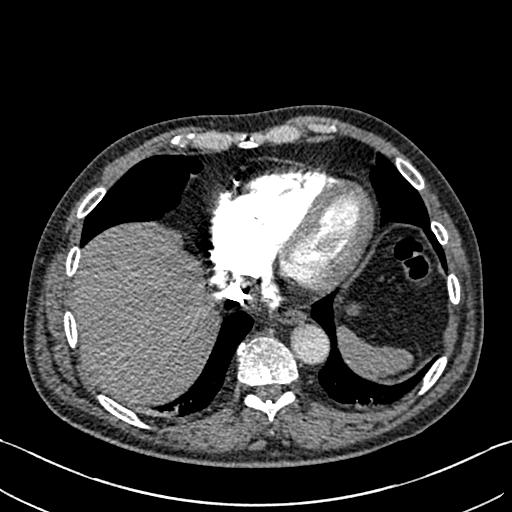
[im 85/280  lung]
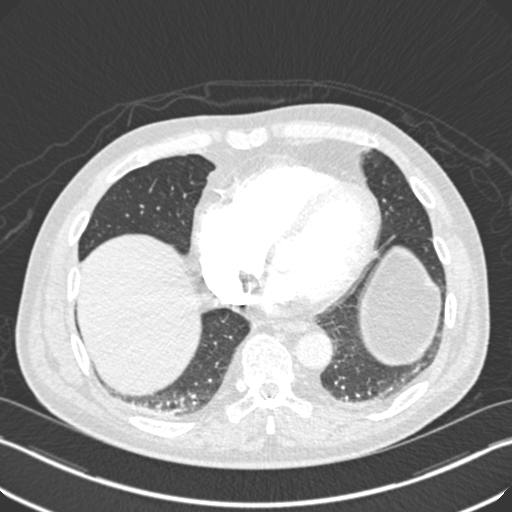
[im 110/280  soft-tissue]
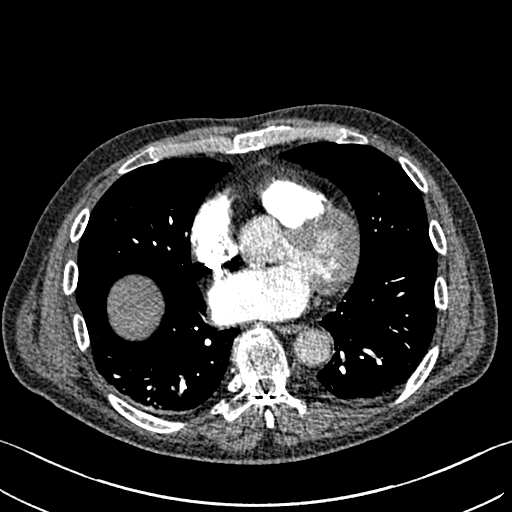
[im 122/280  lung]
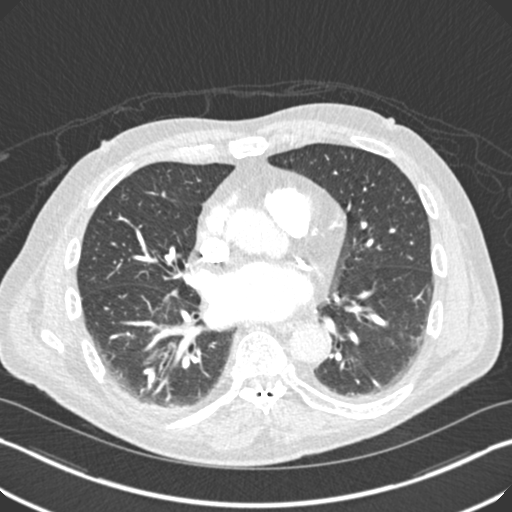
[im 146/280  soft-tissue]
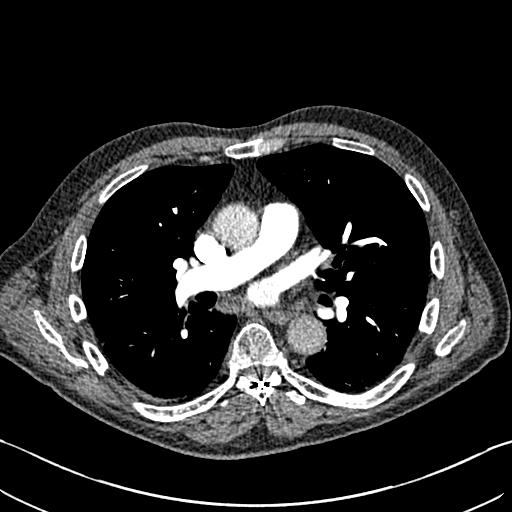
[im 158/280  lung]
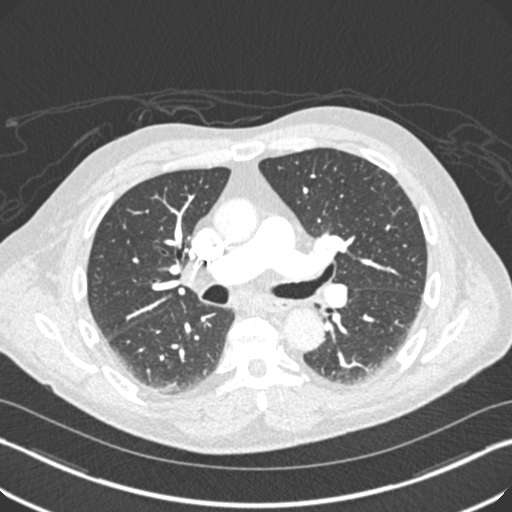
[im 170/280  soft-tissue]
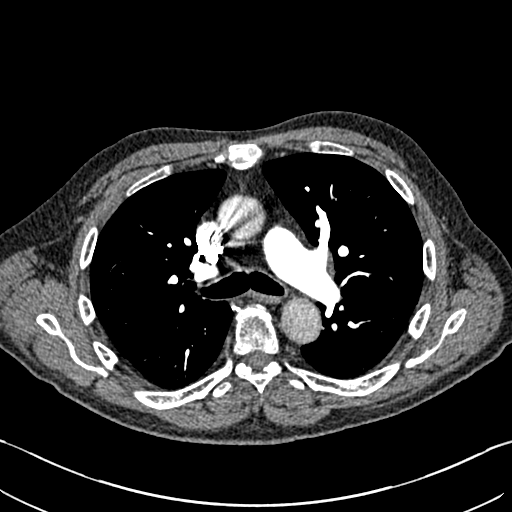
[im 195/280  lung]
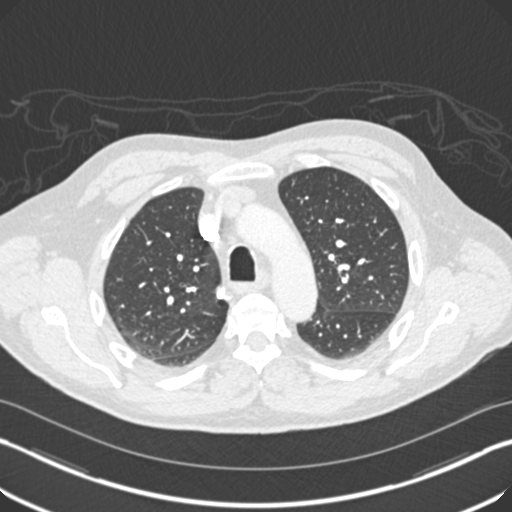
[im 207/280  soft-tissue]
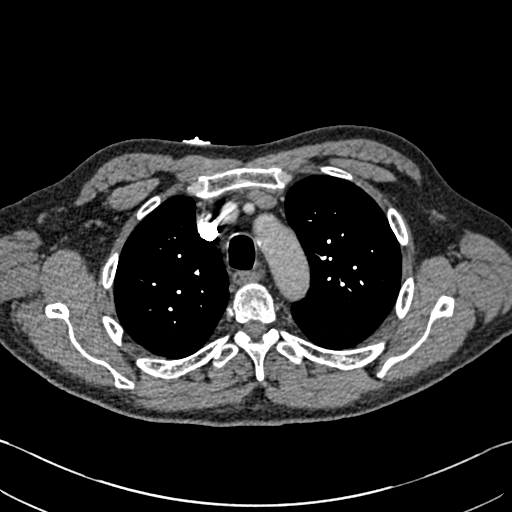
[im 231/280  lung]
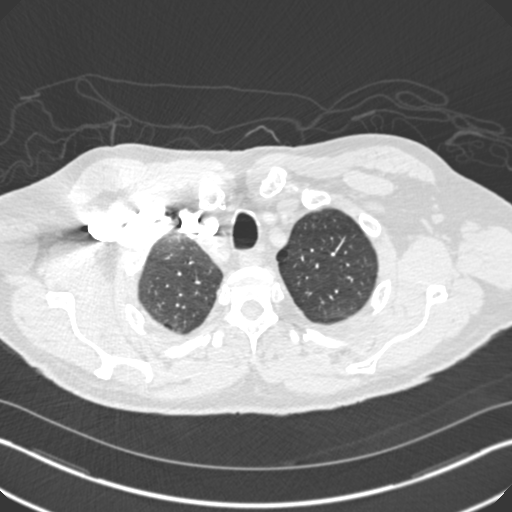
[im 243/280  soft-tissue]
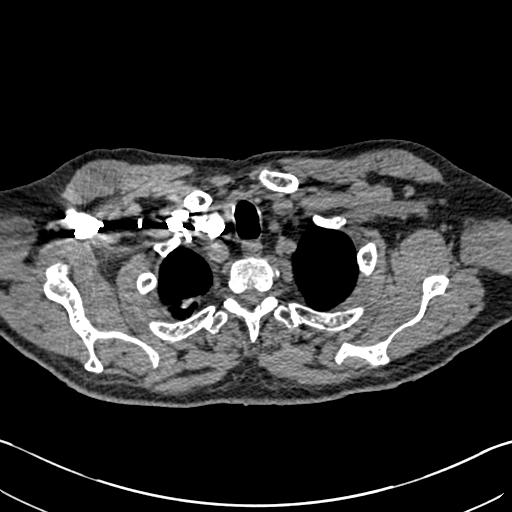
[im 267/280  lung]
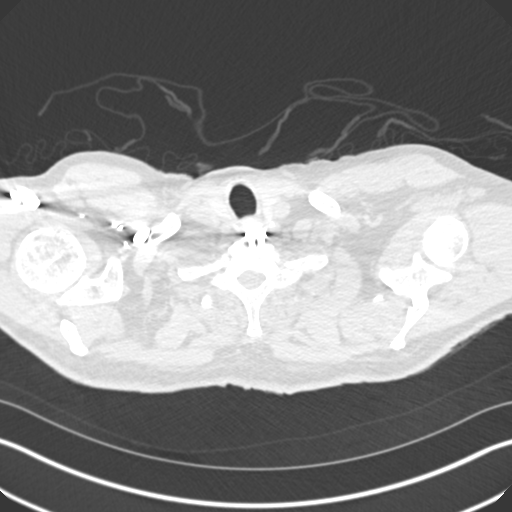

[Series 8: coronal mpr · coronal · 0.55mm/px · 3 of 130 slices shown]
[im 33/130  soft-tissue]
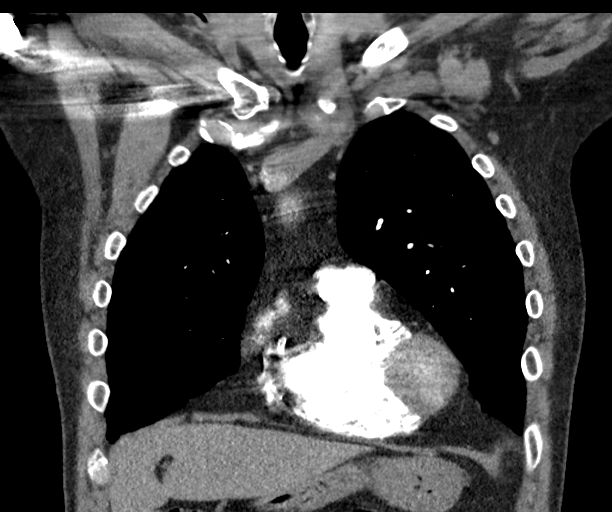
[im 65/130  soft-tissue]
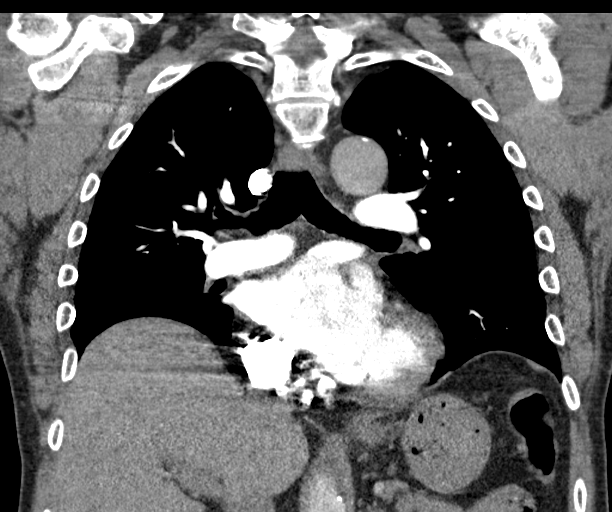
[im 97/130  soft-tissue]
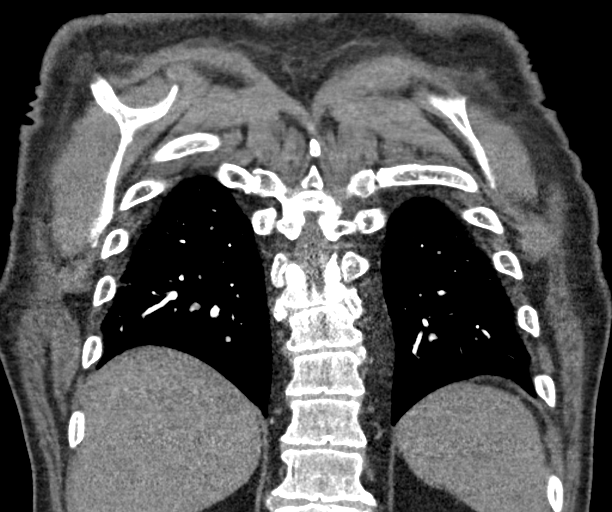

[18 of 46 positions shown; findings below may reference images not displayed]

FINDINGS: Cardiovascular: Satisfactory opacification of the pulmonary arteries
to the segmental level. No evidence of pulmonary embolism. Normal
heart size. No pericardial effusion. Coronary calcifications are
noted.

Mediastinum/Nodes: No enlarged mediastinal, hilar, or axillary lymph
nodes. Thyroid gland, trachea, and esophagus demonstrate no
significant findings.

Lungs/Pleura: Mild right apical scarring is noted. No pneumothorax
or pleural effusion is noted. Stable 4 mm nodule is noted in left
lung base, which can be considered benign at this point with no
further follow-up required.

Upper Abdomen: No acute abnormality.

Musculoskeletal: No chest wall abnormality. No acute or significant
osseous findings.

Review of the MIP images confirms the above findings.
IMPRESSION: No evidence of pulmonary embolus.

Coronary artery calcifications are noted suggesting coronary artery
disease.

Aortic Atherosclerosis ([id number]).

## 2018-05-21 NOTE — Patient Instructions (Signed)
Christopher [name]  05/21/2018     @PREFPERIOPPHARMACY @   Your procedure is scheduled on 05/30/2018.  Report to [facility] at 7:15 A.M.  Call this number if you have problems the morning of surgery:  [phone]   Remember:  No food or drink after midnight. Follow instructions given to you by Dr. Olevia Perches office     Take these medicines the morning of surgery with A SIP OF WATER Uroxitral, Xanax, Gabapentin, Hydrocodone, Protonix, Rhythmol, Flomax    Do not wear jewelry, make-up or nail polish.  Do not wear lotions, powders, or perfumes, or deodorant.  Do not shave 48 hours prior to surgery.  Men may shave face and neck.  Do not bring valuables to the hospital.  [facility] is not responsible for any belongings or valuables.  Contacts, dentures or bridgework may not be worn into surgery.  Leave your suitcase in the car.  After surgery it may be brought to your room.  For patients admitted to the hospital, discharge time will be determined by your treatment team.  Patients discharged the day of surgery will not be allowed to drive home.    Please read over the following fact sheets that you were given. Anesthesia Post-op Instructions     PATIENT INSTRUCTIONS POST-ANESTHESIA  IMMEDIATELY FOLLOWING SURGERY:  Do not drive or operate machinery for the first twenty four hours after surgery.  Do not make any important decisions for twenty four hours after surgery or while taking narcotic pain medications or sedatives.  If you develop intractable nausea and vomiting or a severe headache please notify your doctor immediately.  FOLLOW-UP:  Please make an appointment with your surgeon as instructed. You do not need to follow up with anesthesia unless specifically instructed to do so.  WOUND CARE INSTRUCTIONS (if applicable):  Keep a dry clean dressing on the anesthesia/puncture wound site if there is drainage.  Once the wound has quit draining you may leave it open to air.  Generally you  should leave the bandage intact for twenty four hours unless there is drainage.  If the epidural site drains for more than 36-48 hours please call the anesthesia department.  QUESTIONS?:  Please feel free to call your physician or the hospital operator if you have any questions, and they will be happy to assist you.      Esophagogastroduodenoscopy Esophagogastroduodenoscopy (EGD) is a procedure to examine the lining of the esophagus, stomach, and first part of the small intestine (duodenum). This procedure is done to check for problems such as inflammation, bleeding, ulcers, or growths. During this procedure, a long, flexible, lighted tube with a camera attached (endoscope) is inserted down the throat. Tell a health care provider about:  Any allergies you have.  All medicines you are taking, including vitamins, herbs, eye drops, creams, and over-the-counter medicines.  Any problems you or family members have had with anesthetic medicines.  Any blood disorders you have.  Any surgeries you have had.  Any medical conditions you have.  Whether you are pregnant or may be pregnant. What are the risks? Generally, this is a safe procedure. However, problems may occur, including:  Infection.  Bleeding.  A tear (perforation) in the esophagus, stomach, or duodenum.  Trouble breathing.  Excessive sweating.  Spasms of the larynx.  A slowed heartbeat.  Low blood pressure.  What happens before the procedure?  Follow instructions from your health care provider about eating or drinking restrictions.  Ask your health care provider about: ?  Changing or stopping your regular medicines. This is especially important if you are taking diabetes medicines or blood thinners. ? Taking medicines such as aspirin and ibuprofen. These medicines can thin your blood. Do not take these medicines before your procedure if your health care provider instructs you not to.  Plan to have someone take you  home after the procedure.  If you wear dentures, be ready to remove them before the procedure. What happens during the procedure?  To reduce your risk of infection, your health care team will wash or sanitize their hands.  An IV tube will be put in a vein in your hand or arm. You will get medicines and fluids through this tube.  You will be given one or more of the following: ? A medicine to help you relax (sedative). ? A medicine to numb the area (local anesthetic). This medicine may be sprayed into your throat. It will make you feel more comfortable and keep you from gagging or coughing during the procedure. ? A medicine for pain.  A mouth guard may be placed in your mouth to protect your teeth and to keep you from biting on the endoscope.  You will be asked to lie on your left side.  The endoscope will be lowered down your throat into your esophagus, stomach, and duodenum.  Air will be put into the endoscope. This will help your health care provider see better.  The lining of your esophagus, stomach, and duodenum will be examined.  Your health care provider may: ? Take a tissue sample so it can be looked at in a lab (biopsy). ? Remove growths. ? Remove objects (foreign bodies) that are stuck. ? Treat any bleeding with medicines or other devices that stop tissue from bleeding. ? Widen (dilate) or stretch narrowed areas of your esophagus and stomach.  The endoscope will be taken out. The procedure may vary among health care providers and hospitals. What happens after the procedure?  Your blood pressure, heart rate, breathing rate, and blood oxygen level will be monitored often until the medicines you were given have worn off.  Do not eat or drink anything until the numbing medicine has worn off and your gag reflex has returned. This information is not intended to replace advice given to you by your health care provider. Make sure you discuss any questions you have with your  health care provider. Document Released: 04/05/2005 Document Revised: 05/10/2016 Document Reviewed: 10/27/2015 Elsevier Interactive Patient Education  2018 [employer]. Colonoscopy, Adult A colonoscopy is an exam to look at the entire large intestine. During the exam, a lubricated, bendable tube is inserted into the anus and then passed into the rectum, colon, and other parts of the large intestine. A colonoscopy is often done as a part of normal colorectal screening or in response to certain symptoms, such as anemia, persistent diarrhea, abdominal pain, and blood in the stool. The exam can help screen for and diagnose medical problems, including:  Tumors.  Polyps.  Inflammation.  Areas of bleeding.  Tell a health care provider about:  Any allergies you have.  All medicines you are taking, including vitamins, herbs, eye drops, creams, and over-the-counter medicines.  Any problems you or family members have had with anesthetic medicines.  Any blood disorders you have.  Any surgeries you have had.  Any medical conditions you have.  Any problems you have had passing stool. What are the risks? Generally, this is a safe procedure. However, problems may occur, including:  Bleeding.  A tear in the intestine.  A reaction to medicines given during the exam.  Infection (rare).  What happens before the procedure? Eating and drinking restrictions Follow instructions from your health care provider about eating and drinking, which may include:  A few days before the procedure - follow a low-fiber diet. Avoid nuts, seeds, dried fruit, raw fruits, and vegetables.  1-3 days before the procedure - follow a clear liquid diet. Drink only clear liquids, such as clear broth or bouillon, black coffee or tea, clear juice, clear soft drinks or sports drinks, gelatin dessert, and popsicles. Avoid any liquids that contain red or purple dye.  On the day of the procedure - do not eat or drink  anything during the 2 hours before the procedure, or within the time period that your health care provider recommends.  Bowel prep If you were prescribed an oral bowel prep to clean out your colon:  Take it as told by your health care provider. Starting the day before your procedure, you will need to drink a large amount of medicated liquid. The liquid will cause you to have multiple loose stools until your stool is almost clear or light green.  If your skin or anus gets irritated from diarrhea, you may use these to relieve the irritation: ? Medicated wipes, such as adult wet wipes with aloe and vitamin E. ? A skin soothing-product like petroleum jelly.  If you vomit while drinking the bowel prep, take a break for up to 60 minutes and then begin the bowel prep again. If vomiting continues and you cannot take the bowel prep without vomiting, call your health care provider.  General instructions  Ask your health care provider about changing or stopping your regular medicines. This is especially important if you are taking diabetes medicines or blood thinners.  Plan to have someone take you home from the hospital or clinic. What happens during the procedure?  An IV tube may be inserted into one of your veins.  You will be given medicine to help you relax (sedative).  To reduce your risk of infection: ? Your health care team will wash or sanitize their hands. ? Your anal area will be washed with soap.  You will be asked to lie on your side with your knees bent.  Your health care provider will lubricate a long, thin, flexible tube. The tube will have a camera and a light on the end.  The tube will be inserted into your anus.  The tube will be gently eased through your rectum and colon.  Air will be delivered into your colon to keep it open. You may feel some pressure or cramping.  The camera will be used to take images during the procedure.  A small tissue sample may be removed  from your body to be examined under a microscope (biopsy). If any potential problems are found, the tissue will be sent to a lab for testing.  If small polyps are found, your health care provider may remove them and have them checked for cancer cells.  The tube that was inserted into your anus will be slowly removed. The procedure may vary among health care providers and hospitals. What happens after the procedure?  Your blood pressure, heart rate, breathing rate, and blood oxygen level will be monitored until the medicines you were given have worn off.  Do not drive for 24 hours after the exam.  You may have a small amount of blood in your  stool.  You may pass gas and have mild abdominal cramping or bloating due to the air that was used to inflate your colon during the exam.  It is up to you to get the results of your procedure. Ask your health care provider, or the department performing the procedure, when your results will be ready. This information is not intended to replace advice given to you by your health care provider. Make sure you discuss any questions you have with your health care provider. Document Released: 11/30/2000 Document Revised: 10/03/2016 Document Reviewed: 02/14/2016 Elsevier Interactive Patient Education  2018 [employer].

## 2018-05-23 ENCOUNTER — Encounter (HOSPITAL_COMMUNITY): Payer: Self-pay

## 2018-05-23 ENCOUNTER — Encounter (HOSPITAL_COMMUNITY)
Admission: RE | Admit: 2018-05-23 | Discharge: 2018-05-23 | Disposition: A | Discharge status: Home / Self Care | Payer: Medicare Other | Source: Ambulatory Visit | Attending: Internal Medicine | Admitting: Internal Medicine

## 2018-05-23 ENCOUNTER — Other Ambulatory Visit: Payer: Self-pay

## 2018-05-23 DIAGNOSIS — K219 Gastro-esophageal reflux disease without esophagitis: Secondary | ICD-10-CM | POA: Insufficient documentation

## 2018-05-23 DIAGNOSIS — Z01812 Encounter for preprocedural laboratory examination: Secondary | ICD-10-CM | POA: Insufficient documentation

## 2018-05-23 DIAGNOSIS — Z86010 Personal history of colonic polyps: Secondary | ICD-10-CM | POA: Diagnosis not present

## 2018-05-23 DIAGNOSIS — K2270 Barrett's esophagus without dysplasia: Secondary | ICD-10-CM

## 2018-05-23 LAB — CBC WITH DIFFERENTIAL/PLATELET
Basophils Absolute: 0 10*3/uL (ref 0.0–0.1)
Basophils Relative: 0 %
Eosinophils Absolute: 0.1 10*3/uL (ref 0.0–0.7)
Eosinophils Relative: 3 %
HCT: 40.5 % (ref 39.0–52.0)
Hemoglobin: 13.4 g/dL (ref 13.0–17.0)
Lymphocytes Relative: 40 %
Lymphs Abs: 1.8 10*3/uL (ref 0.7–4.0)
MCH: 29.8 pg (ref 26.0–34.0)
MCHC: 33.1 g/dL (ref 30.0–36.0)
MCV: 90 fL (ref 78.0–100.0)
Monocytes Absolute: 0.4 10*3/uL (ref 0.1–1.0)
Monocytes Relative: 8 %
Neutro Abs: 2.2 10*3/uL (ref 1.7–7.7)
Neutrophils Relative %: 49 %
Platelets: 254 10*3/uL (ref 150–400)
RBC: 4.5 MIL/uL (ref 4.22–5.81)
RDW: 13.8 % (ref 11.5–15.5)
WBC: 4.5 10*3/uL (ref 4.0–10.5)

## 2018-05-23 LAB — BASIC METABOLIC PANEL
Anion gap: 10 (ref 5–15)
BUN: 11 mg/dL (ref 6–20)
CO2: 26 mmol/L (ref 22–32)
Calcium: 9.6 mg/dL (ref 8.9–10.3)
Chloride: 103 mmol/L (ref 101–111)
Creatinine, Ser: 0.91 mg/dL (ref 0.61–1.24)
GFR calc Af Amer: >60 mL/min (ref >60)
GFR calc non Af Amer: >60 mL/min (ref >60)
Glucose, Bld: 122 mg/dL — ABNORMAL HIGH (ref 65–99)
Potassium: 3.3 mmol/L — ABNORMAL LOW (ref 3.5–5.1)
Sodium: 139 mmol/L (ref 135–145)

## 2018-05-27 DIAGNOSIS — M961 Postlaminectomy syndrome, not elsewhere classified: Secondary | ICD-10-CM | POA: Diagnosis not present

## 2018-05-27 DIAGNOSIS — Z9689 Presence of other specified functional implants: Secondary | ICD-10-CM | POA: Diagnosis not present

## 2018-05-29 ENCOUNTER — Encounter (HOSPITAL_COMMUNITY): Payer: Self-pay

## 2018-05-29 ENCOUNTER — Ambulatory Visit (HOSPITAL_COMMUNITY): Admit: 2018-05-29 | Payer: Medicare Other | Admitting: Internal Medicine

## 2018-05-29 SURGERY — EGD (ESOPHAGOGASTRODUODENOSCOPY)
Anesthesia: Moderate Sedation

## 2018-05-30 ENCOUNTER — Ambulatory Visit (HOSPITAL_COMMUNITY)
Admission: RE | Admit: 2018-05-30 | Discharge: 2018-05-30 | Disposition: A | Discharge status: Home / Self Care | Payer: Medicare Other | Source: Ambulatory Visit | Attending: Internal Medicine | Admitting: Internal Medicine

## 2018-05-30 ENCOUNTER — Ambulatory Visit (HOSPITAL_COMMUNITY): Payer: Medicare Other | Admitting: Anesthesiology

## 2018-05-30 ENCOUNTER — Encounter (HOSPITAL_COMMUNITY)
Admission: RE | Disposition: A | Discharge status: Home / Self Care | Payer: Self-pay | Source: Ambulatory Visit | Attending: Internal Medicine

## 2018-05-30 DIAGNOSIS — K31819 Angiodysplasia of stomach and duodenum without bleeding: Secondary | ICD-10-CM | POA: Diagnosis not present

## 2018-05-30 DIAGNOSIS — I4510 Unspecified right bundle-branch block: Secondary | ICD-10-CM | POA: Insufficient documentation

## 2018-05-30 DIAGNOSIS — M1990 Unspecified osteoarthritis, unspecified site: Secondary | ICD-10-CM | POA: Diagnosis not present

## 2018-05-30 DIAGNOSIS — Z87891 Personal history of nicotine dependence: Secondary | ICD-10-CM | POA: Insufficient documentation

## 2018-05-30 DIAGNOSIS — Z09 Encounter for follow-up examination after completed treatment for conditions other than malignant neoplasm: Secondary | ICD-10-CM | POA: Diagnosis not present

## 2018-05-30 DIAGNOSIS — K5730 Diverticulosis of large intestine without perforation or abscess without bleeding: Secondary | ICD-10-CM | POA: Insufficient documentation

## 2018-05-30 DIAGNOSIS — Z860100 Personal history of colon polyps, unspecified: Secondary | ICD-10-CM | POA: Insufficient documentation

## 2018-05-30 DIAGNOSIS — G4700 Insomnia, unspecified: Secondary | ICD-10-CM | POA: Insufficient documentation

## 2018-05-30 DIAGNOSIS — Z885 Allergy status to narcotic agent status: Secondary | ICD-10-CM | POA: Insufficient documentation

## 2018-05-30 DIAGNOSIS — Z7901 Long term (current) use of anticoagulants: Secondary | ICD-10-CM | POA: Insufficient documentation

## 2018-05-30 DIAGNOSIS — Z79899 Other long term (current) drug therapy: Secondary | ICD-10-CM | POA: Insufficient documentation

## 2018-05-30 DIAGNOSIS — Z8719 Personal history of other diseases of the digestive system: Secondary | ICD-10-CM | POA: Diagnosis not present

## 2018-05-30 DIAGNOSIS — K644 Residual hemorrhoidal skin tags: Secondary | ICD-10-CM | POA: Insufficient documentation

## 2018-05-30 DIAGNOSIS — Z981 Arthrodesis status: Secondary | ICD-10-CM | POA: Insufficient documentation

## 2018-05-30 DIAGNOSIS — G629 Polyneuropathy, unspecified: Secondary | ICD-10-CM | POA: Diagnosis not present

## 2018-05-30 DIAGNOSIS — G4730 Sleep apnea, unspecified: Secondary | ICD-10-CM | POA: Insufficient documentation

## 2018-05-30 DIAGNOSIS — K219 Gastro-esophageal reflux disease without esophagitis: Secondary | ICD-10-CM | POA: Diagnosis not present

## 2018-05-30 DIAGNOSIS — K449 Diaphragmatic hernia without obstruction or gangrene: Secondary | ICD-10-CM | POA: Diagnosis not present

## 2018-05-30 DIAGNOSIS — N400 Enlarged prostate without lower urinary tract symptoms: Secondary | ICD-10-CM | POA: Insufficient documentation

## 2018-05-30 DIAGNOSIS — Z888 Allergy status to other drugs, medicaments and biological substances status: Secondary | ICD-10-CM | POA: Insufficient documentation

## 2018-05-30 DIAGNOSIS — Z1211 Encounter for screening for malignant neoplasm of colon: Secondary | ICD-10-CM | POA: Diagnosis not present

## 2018-05-30 DIAGNOSIS — Z86718 Personal history of other venous thrombosis and embolism: Secondary | ICD-10-CM | POA: Insufficient documentation

## 2018-05-30 DIAGNOSIS — I480 Paroxysmal atrial fibrillation: Secondary | ICD-10-CM | POA: Diagnosis not present

## 2018-05-30 DIAGNOSIS — K2270 Barrett's esophagus without dysplasia: Secondary | ICD-10-CM | POA: Insufficient documentation

## 2018-05-30 HISTORY — PX: ESOPHAGOGASTRODUODENOSCOPY (EGD) WITH PROPOFOL: SHX5813

## 2018-05-30 HISTORY — PX: COLONOSCOPY WITH PROPOFOL: SHX5780

## 2018-05-30 HISTORY — PX: BIOPSY: SHX5522

## 2018-05-30 LAB — POCT I-STAT, CHEM 8
BUN: 13 mg/dL (ref 6–20)
Calcium, Ion: 1.16 mmol/L (ref 1.15–1.40)
Chloride: 105 mmol/L (ref 101–111)
Creatinine, Ser: 0.8 mg/dL (ref 0.61–1.24)
Glucose, Bld: 109 mg/dL — ABNORMAL HIGH (ref 65–99)
HCT: 40 % (ref 39.0–52.0)
Hemoglobin: 13.6 g/dL (ref 13.0–17.0)
Potassium: 3.7 mmol/L (ref 3.5–5.1)
Sodium: 140 mmol/L (ref 135–145)
TCO2: 23 mmol/L (ref 22–32)

## 2018-05-30 SURGERY — COLONOSCOPY WITH PROPOFOL
Anesthesia: Monitor Anesthesia Care

## 2018-05-30 MED ORDER — GLYCOPYRROLATE 0.2 MG/ML IJ SOLN
INTRAMUSCULAR | Status: DC | PRN
  Administered 2018-05-30: 0.2 mg via INTRAVENOUS

## 2018-05-30 MED ORDER — CHLORHEXIDINE GLUCONATE CLOTH 2 % EX PADS: 6.0000 | MEDICATED_PAD | Freq: Once | CUTANEOUS | Status: DC

## 2018-05-30 MED ORDER — MIDAZOLAM HCL 2 MG/2ML IJ SOLN
INTRAMUSCULAR | Status: AC
  Filled 2018-05-30: qty 2

## 2018-05-30 MED ORDER — ONDANSETRON HCL 4 MG/2ML IJ SOLN
INTRAMUSCULAR | Status: AC
  Filled 2018-05-30: qty 2

## 2018-05-30 MED ORDER — PROPOFOL 500 MG/50ML IV EMUL
INTRAVENOUS | Status: DC | PRN
  Administered 2018-05-30: 125 ug/kg/min via INTRAVENOUS

## 2018-05-30 MED ORDER — MIDAZOLAM HCL 5 MG/5ML IJ SOLN
INTRAMUSCULAR | Status: DC | PRN
  Administered 2018-05-30 (×2): 1 mg via INTRAVENOUS

## 2018-05-30 MED ORDER — ONDANSETRON HCL 4 MG/2ML IJ SOLN
INTRAMUSCULAR | Status: DC | PRN
  Administered 2018-05-30: 4 mg via INTRAVENOUS

## 2018-05-30 MED ORDER — LACTATED RINGERS IV SOLN
INTRAVENOUS | Status: DC | PRN
  Administered 2018-05-30: 08:00:00 via INTRAVENOUS

## 2018-05-30 MED ORDER — LACTATED RINGERS IV SOLN
INTRAVENOUS | Status: DC
  Administered 2018-05-30: 10:00:00 via INTRAVENOUS

## 2018-05-30 MED ORDER — PROPOFOL 10 MG/ML IV BOLUS
INTRAVENOUS | Status: AC
  Filled 2018-05-30: qty 20

## 2018-05-30 MED ORDER — MEPERIDINE HCL 100 MG/ML IJ SOLN: 6.2500 mg | INTRAMUSCULAR | Status: DC | PRN

## 2018-05-30 MED ORDER — LACTATED RINGERS IV SOLN: INTRAVENOUS | Status: DC

## 2018-05-30 MED ORDER — GLYCOPYRROLATE 0.2 MG/ML IJ SOLN
INTRAMUSCULAR | Status: AC
  Filled 2018-05-30: qty 1

## 2018-05-30 MED ORDER — PROMETHAZINE HCL 25 MG/ML IJ SOLN: 6.2500 mg | INTRAMUSCULAR | Status: DC | PRN

## 2018-05-30 NOTE — Discharge Instructions (Signed)
Resume Eliquis on 05/31/2018.  Do not take Naprosyn while on Eliquis. Resume other medications as before. Resume usual diet. No driving for 24 hours.   Physician will call with biopsy results. Office visit in 1 year. Esophagogastroduodenoscopy, Care After Refer to this sheet in the next few weeks. These instructions provide you with information about caring for yourself after your procedure. Your health care provider may also give you more specific instructions. Your treatment has been planned according to current medical practices, but problems sometimes occur. Call your health care provider if you have any problems or questions after your procedure. What can I expect after the procedure? After the procedure, it is common to have:  A sore throat.  Nausea.  Bloating.  Dizziness.  Fatigue.  Follow these instructions at home:  Do not eat or drink anything until the numbing medicine (local anesthetic) has worn off and your gag reflex has returned. You will know that the local anesthetic has worn off when you can swallow comfortably.  Do not drive for 24 hours if you received a medicine to help you relax (sedative).  If your health care provider took a tissue sample for testing during the procedure, make sure to get your test results. This is your responsibility. Ask your health care provider or the department performing the test when your results will be ready.  Keep all follow-up visits as told by your health care provider. This is important. Contact a health care provider if:  You cannot stop coughing.  You are not urinating.  You are urinating less than usual. Get help right away if:  You have trouble swallowing.  You cannot eat or drink.  You have throat or chest pain that gets worse.  You are dizzy or light-headed.  You faint.  You have nausea or vomiting.  You have chills.  You have a fever.  You have severe abdominal pain.  You have black, tarry, or bloody  stools. This information is not intended to replace advice given to you by your health care provider. Make sure you discuss any questions you have with your health care provider. Document Released: 11/19/2012 Document Revised: 05/10/2016 Document Reviewed: 10/27/2015 Elsevier Interactive Patient Education  2018 [employer].  Colonoscopy, Adult, Care After This sheet gives you information about how to care for yourself after your procedure. Your doctor may also give you more specific instructions. If you have problems or questions, call your doctor. Follow these instructions at home: General instructions   For the first 24 hours after the procedure: ? Do not drive or use machinery. ? Do not sign important documents. ? Do not drink alcohol. ? Do your daily activities more slowly than normal. ? Eat foods that are soft and easy to digest. ? Rest often.  Take over-the-counter or prescription medicines only as told by your doctor.  It is up to you to get the results of your procedure. Ask your doctor, or the department performing the procedure, when your results will be ready. To help cramping and bloating:  Try walking around.  Put heat on your belly (abdomen) as told by your doctor. Use a heat source that your doctor recommends, such as a moist heat pack or a heating pad. ? Put a towel between your skin and the heat source. ? Leave the heat on for 20-30 minutes. ? Remove the heat if your skin turns bright red. This is especially important if you cannot feel pain, heat, or cold. You can get burned.  Eating and drinking  Drink enough fluid to keep your pee (urine) clear or pale yellow.  Return to your normal diet as told by your doctor. Avoid heavy or fried foods that are hard to digest.  Avoid drinking alcohol for as long as told by your doctor. Contact a doctor if:  You have blood in your poop (stool) 2-3 days after the procedure. Get help right away if:  You have more than a  small amount of blood in your poop.  You see large clumps of tissue (blood clots) in your poop.  Your belly is swollen.  You feel sick to your stomach (nauseous).  You throw up (vomit).  You have a fever.  You have belly pain that gets worse, and medicine does not help your pain. This information is not intended to replace advice given to you by your health care provider. Make sure you discuss any questions you have with your health care provider. Document Released: 01/05/2011 Document Revised: 08/27/2016 Document Reviewed: 08/27/2016 Elsevier Interactive Patient Education  2017 [employer].

## 2018-05-30 NOTE — Anesthesia Preprocedure Evaluation (Signed)
Anesthesia Evaluation  Patient identified by MRN, date of birth, ID band Patient awake    Reviewed: Allergy & Precautions, H&P , NPO status , Patient's Chart, lab work & pertinent test results, reviewed documented beta blocker date and time   History of Anesthesia Complications (+) PONV and history of anesthetic complications  Airway Mallampati: II  TM Distance: >3 FB Neck ROM: full    Dental no notable dental hx. (+) Edentulous Upper, Edentulous Lower   Pulmonary neg pulmonary ROS, sleep apnea , former smoker,    Pulmonary exam normal breath sounds clear to auscultation       Cardiovascular Exercise Tolerance: Good negative cardio ROS  Atrial Fibrillation  Rhythm:regular Rate:Normal     Neuro/Psych TIAnegative neurological ROS  negative psych ROS   GI/Hepatic negative GI ROS, Neg liver ROS, hiatal hernia, GERD  ,  Endo/Other  negative endocrine ROS  Renal/GU negative Renal ROS  negative genitourinary   Musculoskeletal   Abdominal   Peds  Hematology negative hematology ROS (+)   Anesthesia Other Findings 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 70monthh/o of new onset Afib  Reproductive/Obstetrics negative OB ROS                             Anesthesia Physical Anesthesia Plan  ASA: III  Anesthesia Plan: MAC   Post-op Pain Management:    Induction:   PONV Risk Score and Plan:   Airway Management Planned:   Additional Equipment:   Intra-op Plan:   Post-operative Plan:   Informed Consent: I have reviewed the patients History and Physical, chart, labs and discussed the procedure including the risks, benefits and alternatives for the proposed anesthesia with the patient or authorized representative who has indicated his/her understanding and acceptance.   Dental Advisory Given  Plan Discussed with: CRNA and Anesthesiologist  Anesthesia Plan Comments:         Anesthesia Quick  Evaluation

## 2018-05-30 NOTE — Op Note (Signed)
[facility] Patient Name: Christopher [name] Procedure Date: 05/30/2018 9:18 AM MRN: [id number] Date of Birth: Jul 02, 1946 Attending MD: Hildred Laser , MD CSN: [id number] Age: 72 Admit Type: Outpatient Procedure:                Upper GI endoscopy Indications:              Follow-up of Barrett's esophagus Providers:                Hildred Laser, MD, Otis Peak B. Sharon Seller, RN, Randa Spike, Technician Referring MD:             Delphina Cahill, MD Medicines:                Propofol per Anesthesia Complications:            No immediate complications. Estimated Blood Loss:     Estimated blood loss was minimal. Procedure:                Pre-Anesthesia Assessment:                           - Prior to the procedure, a History and Physical                            was performed, and patient medications and                            allergies were reviewed. The patient's tolerance of                            previous anesthesia was also reviewed. The risks                            and benefits of the procedure and the sedation                            options and risks were discussed with the patient.                            All questions were answered, and informed consent                            was obtained. Prior Anticoagulants: The patient                            last took Eliquis (apixaban) 2 days and naproxen 2                            days prior to the procedure. ASA Grade Assessment:                            III - A patient with severe systemic disease. After  reviewing the risks and benefits, the patient was                            deemed in satisfactory condition to undergo the                            procedure.                           After obtaining informed consent, the endoscope was                            passed under direct vision. Throughout the                            procedure, the patient's  blood pressure, pulse, and                            oxygen saturations were monitored continuously. The                            [id number]) scope was introduced through the                            and advanced to the second part of duodenum. The                            upper GI endoscopy was accomplished without                            difficulty. The patient tolerated the procedure                            well. Scope In: 9:38:19 AM Scope Out: 9:47:03 AM Total Procedure Duration: 0 hours 8 minutes 44 seconds  Findings:      The proximal esophagus and mid esophagus were normal.      There were esophageal mucosal changes secondary to established       short-segment Barrett's disease present in the distal esophagus. The       maximum longitudinal extent of these mucosal changes was 1 cm in length.       This was biopsied with a cold forceps for histology. The pathology       specimen was placed into Bottle Number 1.      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      The exam of the stomach was otherwise normal.      The duodenal bulb and second portion of the duodenum were normal. Impression:               - Normal proximal esophagus and mid esophagus.                           - Esophageal mucosal changes secondary to  established short-segment Barrett's disease.                            Biopsied.                           - Gastric antral vascular ectasia. Mild and                            non-bleeding.                           - Normal duodenal bulb and second portion of the                            duodenum. Moderate Sedation:      Per Anesthesia Care Recommendation:           - Patient has a contact number available for                            emergencies. The signs and symptoms of potential                            delayed complications  were discussed with the                            patient. Return to normal activities tomorrow.                            Written discharge instructions were provided to the                            patient.                           - Resume previous diet today.                           - Continue present medications.                           - Resume Eliquis (apixaban) at prior dose tomorrow.                           - Await pathology results.                           - Return to GI clinic in 1 year. Procedure Code(s):        --- Professional ---                           [phone], Esophagogastroduodenoscopy, flexible,                            transoral; with biopsy, single or multiple Diagnosis Code(s):        --- Professional ---  K22.70, Barrett's esophagus without dysplasia                           K31.819, Angiodysplasia of stomach and duodenum                            without bleeding CPT copyright 2017 [employer]. All rights reserved. The codes documented in this report are preliminary and upon coder review may  be revised to meet current compliance requirements. Hildred Laser, MD Hildred Laser, MD 05/30/2018 10:19:51 AM This report has been signed electronically. Number of Addenda: 0

## 2018-05-30 NOTE — Transfer of Care (Signed)
Immediate Anesthesia Transfer of Care Note  Patient: Christopher [name]  Procedure(s) Performed: COLONOSCOPY WITH PROPOFOL (N/A ) ESOPHAGOGASTRODUODENOSCOPY (EGD) WITH PROPOFOL (N/A ) BIOPSY  Patient Location: PACU  Anesthesia Type:MAC  Level of Consciousness: awake and patient cooperative  Airway & Oxygen Therapy: Patient Spontanous Breathing  Post-op Assessment: Report given to RN, Post -op Vital signs reviewed and stable and Patient moving all extremities  Post vital signs: Reviewed and stable  Last Vitals:  Vitals Value Taken Time  BP    Temp    Pulse 26 05/30/2018 10:22 AM  Resp    SpO2 82 % 05/30/2018 10:22 AM  Vitals shown include unvalidated device data.  Last Pain:  Vitals:   05/30/18 0934  TempSrc:   PainSc: 0-No pain         Complications: No apparent anesthesia complications

## 2018-05-30 NOTE — Anesthesia Postprocedure Evaluation (Signed)
Anesthesia Post Note  Patient: Christopher [name]  Procedure(s) Performed: COLONOSCOPY WITH PROPOFOL (N/A ) ESOPHAGOGASTRODUODENOSCOPY (EGD) WITH PROPOFOL (N/A ) BIOPSY  Patient location during evaluation: PACU Anesthesia Type: MAC Level of consciousness: awake and patient cooperative Pain management: pain level controlled Vital Signs Assessment: post-procedure vital signs reviewed and stable Respiratory status: spontaneous breathing, nonlabored ventilation and respiratory function stable Cardiovascular status: blood pressure returned to baseline Postop Assessment: no apparent nausea or vomiting Anesthetic complications: no     Last Vitals:  Vitals:   05/30/18 0749  BP: 135/74  Pulse: (!) 51  Resp: 17  Temp: 36.4 C  SpO2: 92%    Last Pain:  Vitals:   05/30/18 0934  TempSrc:   PainSc: 0-No pain                 [name]

## 2018-05-30 NOTE — Op Note (Signed)
[facility] Patient Name: Christopher [name] Procedure Date: 05/30/2018 9:49 AM MRN: [id number] Date of Birth: 12-01-1946 Attending MD: Hildred Laser , MD CSN: [id number] Age: 72 Admit Type: Outpatient Procedure:                Colonoscopy Indications:              High risk colon cancer surveillance: Personal                            history of colonic polyps Providers:                Hildred Laser, MD, Gwenlyn Fudge RN, RN, Randa Spike, Technician Referring MD:             Delphina Cahill, MD Medicines:                Propofol per Anesthesia Complications:            No immediate complications. Estimated Blood Loss:     Estimated blood loss: none. Procedure:                Pre-Anesthesia Assessment:                           - Prior to the procedure, a History and Physical                            was performed, and patient medications and                            allergies were reviewed. The patient's tolerance of                            previous anesthesia was also reviewed. The risks                            and benefits of the procedure and the sedation                            options and risks were discussed with the patient.                            All questions were answered, and informed consent                            was obtained. Prior Anticoagulants: The patient                            last took Eliquis (apixaban) 2 days and naproxen 2                            days prior to the procedure. ASA Grade Assessment:  III - A patient with severe systemic disease. After                            reviewing the risks and benefits, the patient was                            deemed in satisfactory condition to undergo the                            procedure.                           After obtaining informed consent, the colonoscope                            was passed under direct vision. Throughout the                           procedure, the patient's blood pressure, pulse, and                            oxygen saturations were monitored continuously. The                            [id number] ([id number]) scope was introduced through                            the anus and advanced to the the cecum, identified                            by appendiceal orifice and ileocecal valve. The                            ileocecal valve, appendiceal orifice, and rectum                            were photographed. The quality of the bowel                            preparation was adequate to identify polyps 6 mm                            and larger in size. Scope In: 9:51:29 AM Scope Out: 10:10:03 AM Scope Withdrawal Time: 0 hours 8 minutes 9 seconds  Total Procedure Duration: 0 hours 18 minutes 34 seconds  Findings:      The perianal and digital rectal examinations were normal.      A single medium-mouthed diverticulum was found in the sigmoid colon.      The exam was otherwise normal throughout the examined colon.      External hemorrhoids were found during retroflexion. The hemorrhoids       were small. Impression:               - Diverticulosis in the sigmoid colon.                           -  External hemorrhoids.                           - No specimens collected. Moderate Sedation:      Per Anesthesia Care Recommendation:           - Patient has a contact number available for                            emergencies. The signs and symptoms of potential                            delayed complications were discussed with the                            patient. Return to normal activities tomorrow.                            Written discharge instructions were provided to the                            patient.                           - Resume previous diet today.                           - Continue present medications.                           - See the other procedure note for  documentation of                            additional recommendations.                           - No repeat colonoscopy due to age and the absence                            of advanced adenomas. Procedure Code(s):        --- Professional ---                           [phone], Colonoscopy, flexible; diagnostic, including                            collection of specimen(s) by brushing or washing,                            when performed (separate procedure) Diagnosis Code(s):        --- Professional ---                           Z86.010, Personal history of colonic polyps                           K64.4, Residual hemorrhoidal skin tags  K57.30, Diverticulosis of large intestine without                            perforation or abscess without bleeding CPT copyright 2017 American [employer]. All rights reserved. The codes documented in this report are preliminary and upon coder review may  be revised to meet current compliance requirements. Hildred Laser, MD Hildred Laser, MD 05/30/2018 10:23:07 AM This report has been signed electronically. Number of Addenda: 0

## 2018-05-30 NOTE — H&P (Signed)
Christopher [name] is an 72 y.o. male.   Chief Complaint: Patient is here for EGD and colonoscopy. HPI: Patient is 72 year old Caucasian male who has chronic GERD complicated by short segment Barrett's esophagus as well as history of colonic adenoma and sessile serrated polyp who is here for surveillance EGD and colonoscopy.  He says heartburn is well controlled with therapy.  He had flareup which he believes is due to 1 of medications.  He denies dysphagia nausea or vomiting.  He denies abdominal pain melena or rectal bleeding. Family history is negative for CRC. He has been off Eliquis for 2 days.  Past Medical History:  Diagnosis Date  . A-fib ([place])   . Arthritis    "all over my body" (06/17/2017)  . BPH (benign prostatic hyperplasia)    takes Proscar daily  . Chronic back pain    DDD; "upper and lower" (06/17/2017)  . Constipation    takes Colace daily  . DDD (degenerative disc disease), cervical   . DVT (deep venous thrombosis) ([facility])    left peroneal vein 07/2017 (in setting of recent left TKA)  . Dysrhythmia    history of Atrial fibrillation, no long has this  . Enlarged prostate   . GERD (gastroesophageal reflux disease)    takes Nexium daily  . H/O hiatal hernia   . History of colon polyps    benign  . History of gastric ulcer   . Insomnia    takes Xanax nightly  . Joint capsule tear    left knee  . Joint pain   . Joint swelling   . Macular degeneration   . Muscle spasm of back    takes Valium daily as needed  . PAF (paroxysmal atrial fibrillation) ([place])   . Peripheral neuropathy    takes Gabapentin daily  . PONV (postoperative nausea and vomiting)   . Sleep apnea    pt does not wear a cpap  . Urinary hesitancy   . Weakness    numbness and tingling    Past Surgical History:  Procedure Laterality Date  . ANTERIOR CERVICAL DECOMP/DISCECTOMY FUSION  ~ 1991   "took bone from hip"  . ANTERIOR CERVICAL DECOMP/DISCECTOMY FUSION  X 2   "put plate in"  . ANTERIOR CERVICAL  DISCECTOMY  <11/2005    5-6 and 4-5./notes 05/02/2011  . APPENDECTOMY    . BACK SURGERY    . CARDIAC CATHETERIZATION    . CARPAL TUNNEL RELEASE Bilateral 10/2005 - 11/2005   right-left Archie Endo 05/02/2011  . CATARACT EXTRACTION W/PHACO Right 05/24/2014   Procedure: CATARACT EXTRACTION PHACO AND INTRAOCULAR LENS PLACEMENT (IOC);  Surgeon: [name], MD;  Location: [facility];  Service: Ophthalmology;  Laterality: Right;  CDE 6.74  . CATARACT EXTRACTION W/PHACO Left 06/17/2014   Procedure: CATARACT EXTRACTION PHACO AND INTRAOCULAR LENS PLACEMENT (IOC);  Surgeon: [name], MD;  Location: [facility];  Service: Ophthalmology;  Laterality: Left;  CDE:4.65  . COLONOSCOPY WITH ESOPHAGOGASTRODUODENOSCOPY (EGD) N/A 01/22/2013   Procedure: COLONOSCOPY WITH ESOPHAGOGASTRODUODENOSCOPY (EGD);  Surgeon: [name], MD;  Location: [facility];  Service: Endoscopy;  Laterality: N/A;  140  . EYE SURGERY Bilateral    "cleaned film w/laser; since my 1st cataract OR"  . JOINT REPLACEMENT    . POSTERIOR LUMBAR FUSION     "put a plate in"  . QUADRICEPS TENDON REPAIR Left 07/01/2017  . QUADRICEPS TENDON REPAIR Left 07/01/2017   Procedure: REPAIR QUADRICEP TENDON;  Surgeon: [name], MD;  Location: [place];  Service: Orthopedics;  Laterality: Left;  . SHOULDER ARTHROSCOPY Bilateral   . SPINAL CORD STIMULATOR INSERTION N/A 09/23/2015   Procedure: LUMBAR SPINAL CORD STIMULATOR INSERTION;  Surgeon: [name], MD;  Location: [facility];  Service: Neurosurgery;  Laterality: N/A;  LUMBAR SPINAL CORD STIMULATOR INSERTION  . TOTAL KNEE ARTHROPLASTY Left 06/17/2017  . TOTAL KNEE ARTHROPLASTY Left 06/17/2017   Procedure: TOTAL KNEE ARTHROPLASTY;  Surgeon: [name], MD;  Location: [place];  Service: Orthopedics;  Laterality: Left;  . TOTAL KNEE ARTHROPLASTY Right 12/16/2017   Procedure: TOTAL KNEE ARTHROPLASTY;  Surgeon: [name], MD;  Location: [place];  Service: Orthopedics;  Laterality: Right;    No family history on  file. Social History:  reports that he quit smoking about 31 years ago. His smoking use included cigarettes. He has a 30.00 pack-year smoking history. He has quit using smokeless tobacco. His smokeless tobacco use included chew. He reports that he drinks about 9.0 oz of alcohol per week. He reports that he does not use drugs.  Allergies:  Allergies  Allergen Reactions  . Morphine And Related Nausea And Vomiting  . Propofol Nausea And Vomiting    Medications Prior to Admission  Medication Sig Dispense Refill  . alfuzosin (UROXATRAL) 10 MG 24 hr tablet Take 10 mg by mouth daily.    [name] ALPRAZolam (XANAX) 1 MG tablet Take 1 mg by mouth at bedtime.  0  . apixaban (ELIQUIS) 5 MG TABS tablet Take 5 mg by mouth 2 (two) times daily.    . baclofen (LIORESAL) 10 MG tablet Take 1 tablet (10 mg total) by mouth 3 (three) times daily. (Patient taking differently: Take 10 mg by mouth 3 (three) times daily as needed for muscle spasms. ) 60 each 0  . Cholecalciferol (VITAMIN D3) 2000 units capsule Take 2,000 Units by mouth daily.    [name] docusate sodium (COLACE) 100 MG capsule Take 200 mg by mouth daily.    [name] gabapentin (NEURONTIN) 400 MG capsule Take 400 mg by mouth 2 (two) times daily.   2  . HYDROcodone-acetaminophen (NORCO) 10-325 MG tablet Take 1 tablet by mouth every 6 (six) hours as needed for moderate pain.     . magnesium oxide (MAG-OX) 400 MG tablet Take 400 mg by mouth daily.     . Misc Natural Products (GLUCOSAMINE CHONDROITIN TRIPLE) TABS Take 1 tablet by mouth daily.    . naproxen (NAPROSYN) 500 MG tablet Take 500 mg by mouth 2 (two) times daily.  11  . Omega-3 Fatty Acids (FISH OIL) 1000 MG CAPS Take 1,000 mg by mouth daily.     . pantoprazole (PROTONIX) 40 MG tablet Take 40 mg by mouth daily.     . Probiotic CAPS Take 1 capsule by mouth daily.    . propafenone (RYTHMOL) 225 MG tablet Take 1 tablet (225 mg total) by mouth 2 (two) times daily. 60 tablet 11  . tamsulosin (FLOMAX) 0.4 MG CAPS  capsule Take 0.4 mg by mouth daily.  11  . vitamin B-12 (CYANOCOBALAMIN) 500 MCG tablet Take 500 mcg by mouth daily.    [name] GAVILYTE-N WITH FLAVOR PACK 420 g solution Take 4,000 mLs by mouth once.  0    Results for orders placed or performed during the hospital encounter of 05/30/18 (from the past 48 hour(s))  I-STAT, chem 8     Status: Abnormal   Collection Time: 05/30/18  7:56 AM  Result Value Ref Range   Sodium 140 135 - 145 mmol/L  Potassium 3.7 3.5 - 5.1 mmol/L   Chloride 105 101 - 111 mmol/L   BUN 13 6 - 20 mg/dL   Creatinine, Ser 0.80 0.61 - 1.24 mg/dL   Glucose, Bld 109 (H) 65 - 99 mg/dL   Calcium, Ion 1.16 1.15 - 1.40 mmol/L   TCO2 23 22 - 32 mmol/L   Hemoglobin 13.6 13.0 - 17.0 g/dL   HCT 40.0 39.0 - 52.0 %   No results found.  ROS  Blood pressure 135/74, pulse (!) 51, temperature 97.6 F (36.4 C), temperature source Oral, resp. rate 17, SpO2 92 %. Physical Exam  Constitutional: He appears well-developed and well-nourished.  HENT:  Mouth/Throat: Oropharynx is clear and moist.  Eyes: Conjunctivae are normal. No scleral icterus.  Neck: No thyromegaly present.  Cardiovascular: Normal rate, regular rhythm and normal heart sounds.  No murmur heard. Respiratory: Effort normal and breath sounds normal.  GI:  Abdomen is full.  Small umbilical hernia noted.  There is appendectomy scar.  No organomegaly or masses.  Musculoskeletal: He exhibits no edema.  Lymphadenopathy:    He has no cervical adenopathy.  Neurological: He is alert.  Skin: Skin is warm and dry.     Assessment/Plan Chronic GERD complicated by short segment Barrett's esophagus. History of colonic adenoma and sessile serrated polyp. Surveillance EGD and colonoscopy.  Hildred Laser, MD 05/30/2018, 8:52 AM

## 2018-06-05 ENCOUNTER — Encounter (HOSPITAL_COMMUNITY): Payer: Self-pay | Admitting: Internal Medicine

## 2018-06-25 ENCOUNTER — Telehealth: Payer: Self-pay | Admitting: Internal Medicine

## 2018-06-25 NOTE — Telephone Encounter (Signed)
Pt is having problems with his Afib for the last few nights, they have tried giving the extra medication and it's not working.

## 2018-06-25 NOTE — Telephone Encounter (Signed)
Spoke with pt. He stated that for the past few days he has had to take an extra Rythmol as his a-fib has caused his heart to race in the 170's for several hours at a time. He stated that at [place] with Dr. Lovena Le it was mentioned if he started having problems then his medication would be changed. He stated that this would be a good time, as he does not like the way the fast heart rates make him feel. Please  advise.

## 2018-06-27 NOTE — Telephone Encounter (Signed)
Ask the patient to increase propafenone to 325 bid. He will need and ecg in 2 weeks. Followup with me in 3-4 weeks.

## 2018-06-30 ENCOUNTER — Other Ambulatory Visit: Payer: Self-pay | Admitting: Internal Medicine

## 2018-06-30 MED ORDER — PROPAFENONE HCL ER 325 MG PO CP12: 325.0000 mg | ORAL_CAPSULE | Freq: Two times a day (BID) | ORAL | 3 refills | Status: DC

## 2018-06-30 NOTE — Telephone Encounter (Signed)
Returned pt call. Informed pt of medication increase. He voiced understanding. [facility].

## 2018-07-10 DIAGNOSIS — K219 Gastro-esophageal reflux disease without esophagitis: Secondary | ICD-10-CM | POA: Diagnosis not present

## 2018-07-10 DIAGNOSIS — E782 Mixed hyperlipidemia: Secondary | ICD-10-CM | POA: Diagnosis not present

## 2018-07-10 DIAGNOSIS — E559 Vitamin D deficiency, unspecified: Secondary | ICD-10-CM | POA: Diagnosis not present

## 2018-07-10 DIAGNOSIS — I4891 Unspecified atrial fibrillation: Secondary | ICD-10-CM | POA: Diagnosis not present

## 2018-07-10 DIAGNOSIS — D519 Vitamin B12 deficiency anemia, unspecified: Secondary | ICD-10-CM | POA: Diagnosis not present

## 2018-07-14 ENCOUNTER — Ambulatory Visit (INDEPENDENT_AMBULATORY_CARE_PROVIDER_SITE_OTHER): Payer: Medicare Other | Admitting: *Deleted

## 2018-07-14 VITALS — Ht 68.0 in | Wt 167.6 lb

## 2018-07-14 DIAGNOSIS — I4891 Unspecified atrial fibrillation: Secondary | ICD-10-CM

## 2018-07-14 NOTE — Progress Notes (Signed)
Pt states that he has had 2-3 " Afib attacks". They happen around 9 pm-11 pm the last one being one week ago. Pt states that he feels fine today.

## 2018-07-16 DIAGNOSIS — N400 Enlarged prostate without lower urinary tract symptoms: Secondary | ICD-10-CM | POA: Diagnosis not present

## 2018-07-16 DIAGNOSIS — E782 Mixed hyperlipidemia: Secondary | ICD-10-CM | POA: Diagnosis not present

## 2018-07-16 DIAGNOSIS — D519 Vitamin B12 deficiency anemia, unspecified: Secondary | ICD-10-CM | POA: Diagnosis not present

## 2018-07-16 DIAGNOSIS — E559 Vitamin D deficiency, unspecified: Secondary | ICD-10-CM | POA: Diagnosis not present

## 2018-07-16 DIAGNOSIS — I4891 Unspecified atrial fibrillation: Secondary | ICD-10-CM | POA: Diagnosis not present

## 2018-07-23 DIAGNOSIS — I4891 Unspecified atrial fibrillation: Secondary | ICD-10-CM | POA: Diagnosis not present

## 2018-07-23 DIAGNOSIS — K219 Gastro-esophageal reflux disease without esophagitis: Secondary | ICD-10-CM | POA: Diagnosis not present

## 2018-07-23 DIAGNOSIS — R7301 Impaired fasting glucose: Secondary | ICD-10-CM | POA: Diagnosis not present

## 2018-07-23 DIAGNOSIS — E782 Mixed hyperlipidemia: Secondary | ICD-10-CM | POA: Diagnosis not present

## 2018-07-25 ENCOUNTER — Telehealth: Payer: Self-pay | Admitting: Internal Medicine

## 2018-07-25 NOTE — Telephone Encounter (Signed)
New Message         [place] office called stating that the Rx *(Rythmol) is costing the patient a $100.00. Patient would like  the Rx that was cheaper that he got last month but in a higher dosage because he is out of rhythm. ( Per Dr. Juel Burrow office). I spoke with Hildred Alamin [phone] the pharmacist [phone] if any questions.

## 2018-07-25 NOTE — Telephone Encounter (Signed)
Called and spoke with patient to clarify the message. Patient asked me to speak to his wife to clarify medications. She states patient was started on Pravastatin by Dr. Nevada Crane. I asked about the Propafenone cost and if the patient has the medication at home; states it was just filled and it was $16. The wife states the concern is about Eliquis which is $141 for one month. Patient would like help with the cost of Eliquis. I advised wife that I will forward message to our patient care advocates to see if patient can get assistance with cost of eliquis. Wife states patient has no complaints, he is feeling well.   I do not see the where the patient has a f/u with Dr. Lovena Le scheduled. Per Dr. Tanna Furry note on 7/12, patient was to be seen in 3-4 weeks.

## 2018-07-28 IMAGING — DX DG CHEST 2V
2 series · 2 of 2 positions shown · non-contrast
Comparison: 07/22/2017

CLINICAL DATA: Tachycardia and palpitation

EXAM:
CHEST  2 VIEW

[chest lat]
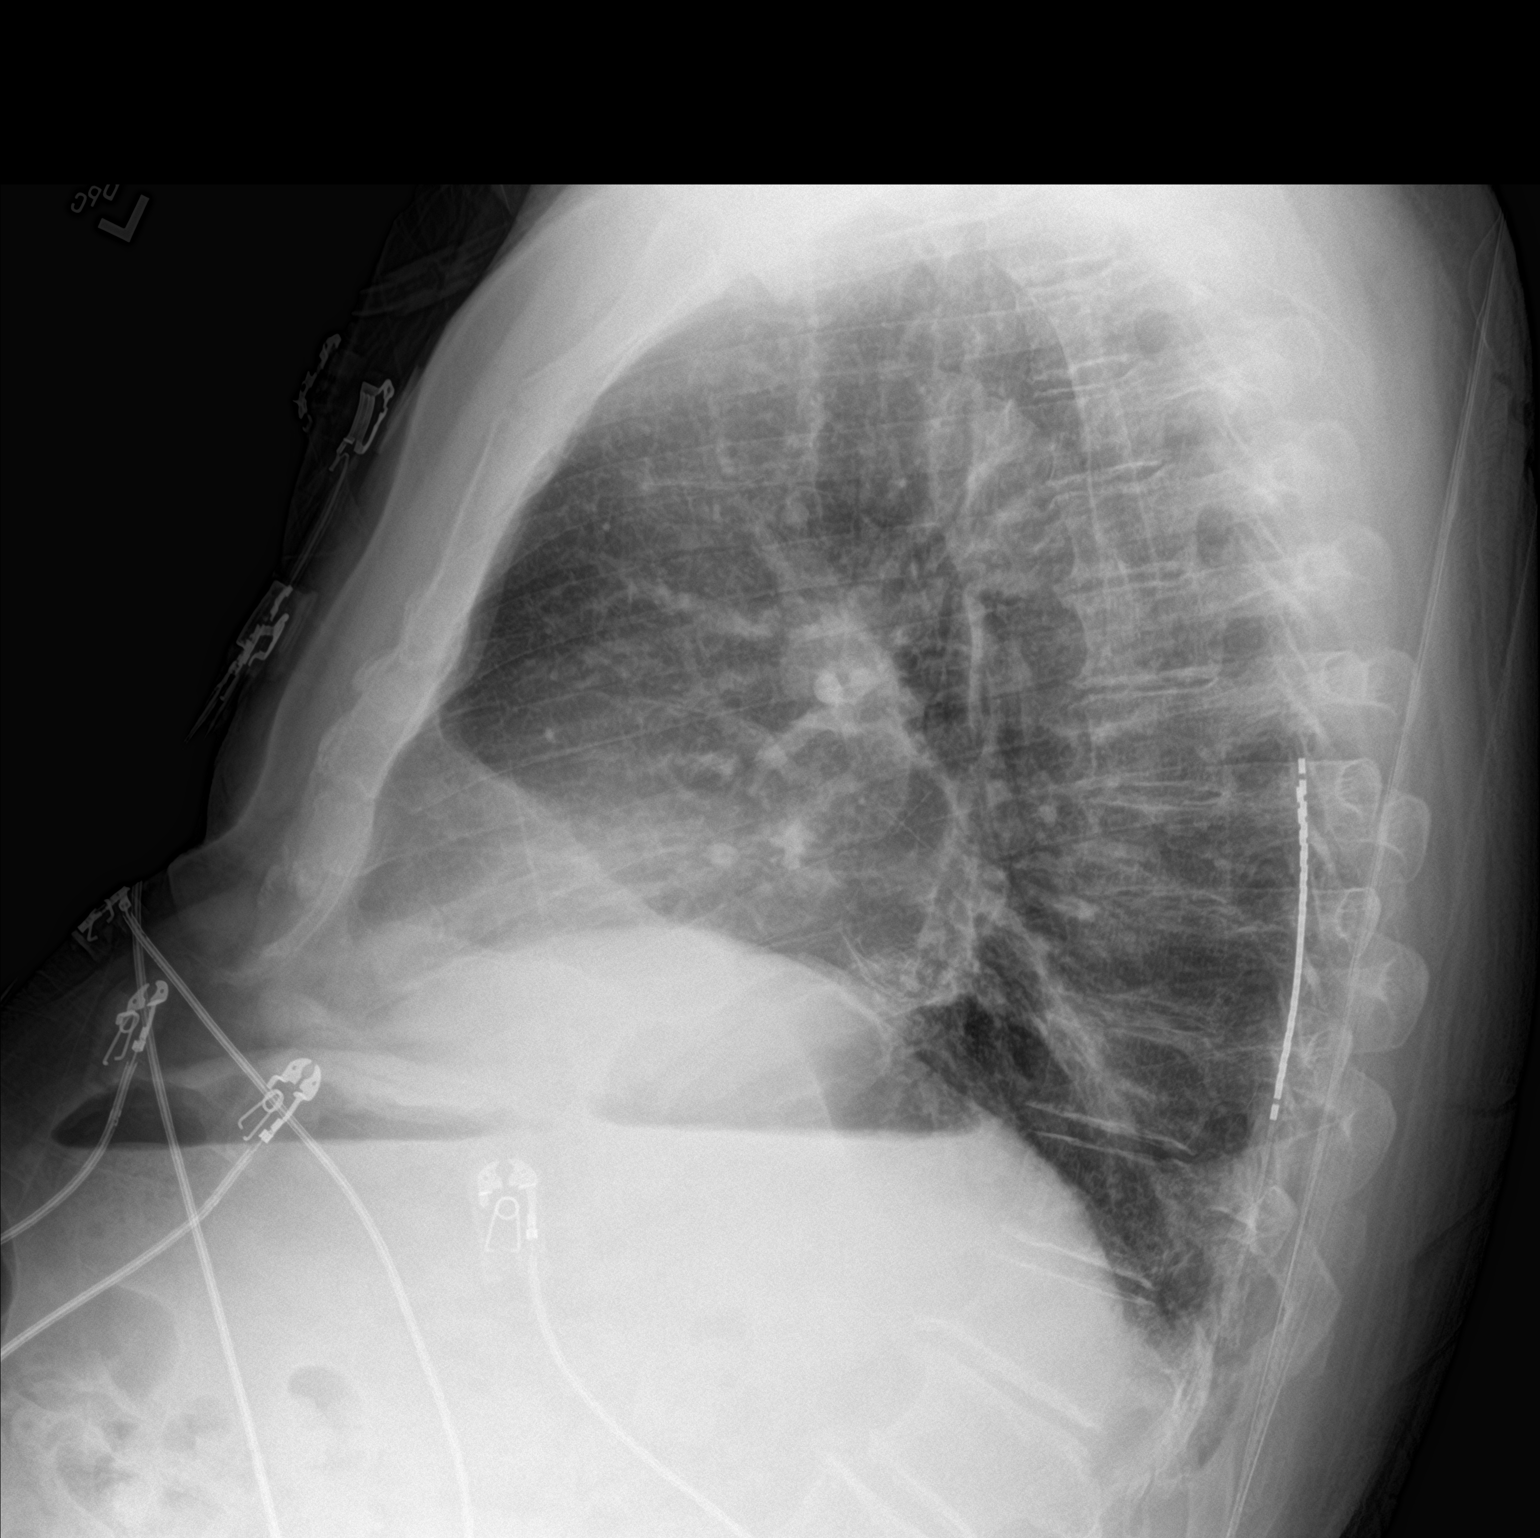

[chest ap]
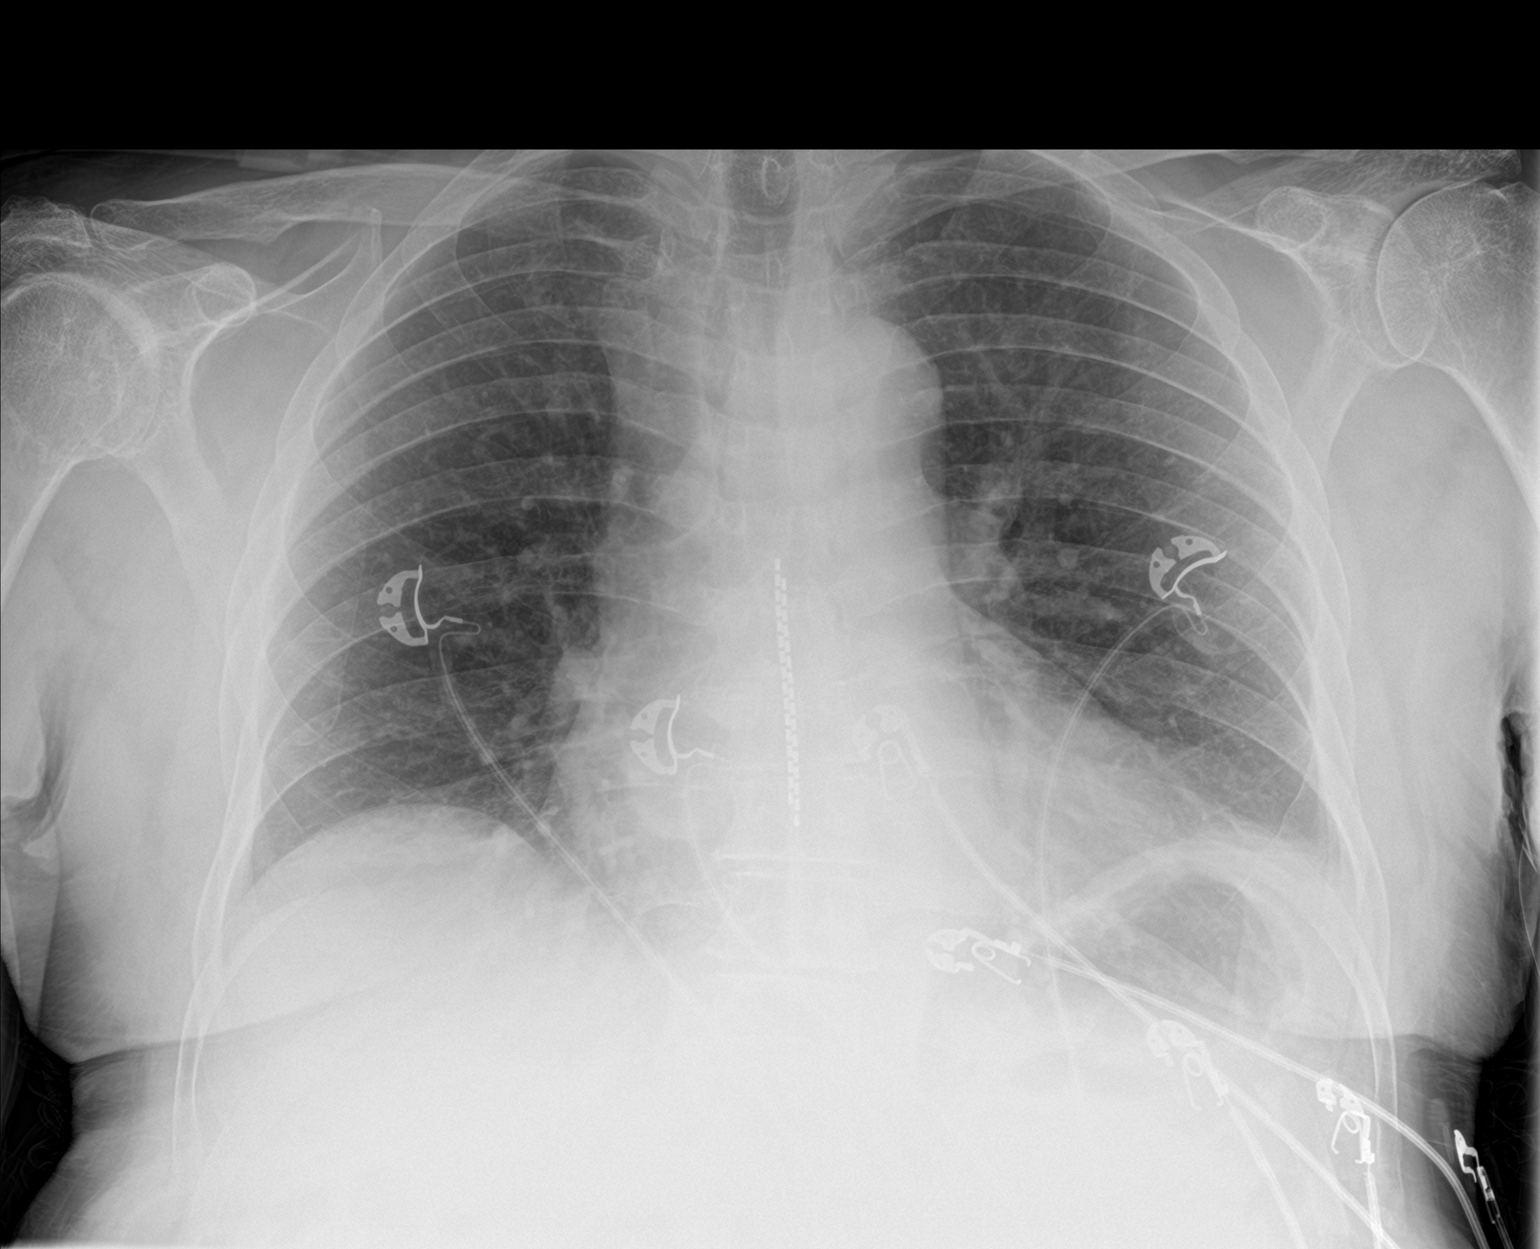

[2 of 2 positions shown; findings below may reference images not displayed]

FINDINGS: Postsurgical changes in the lower cervical spine. Thoracic
stimulator device. Streaky atelectasis or small infiltrate at the
left base. Mild cardiomegaly. No pneumothorax. Degenerative changes
of the spine.
IMPRESSION: 1. Mild cardiomegaly without edema
2. Streaky atelectasis or small infiltrate at the left base.

## 2018-07-28 NOTE — Telephone Encounter (Signed)
Agree 

## 2018-07-29 NOTE — Telephone Encounter (Signed)
Letter received from [place] stating that the pts Eliquis tier exception has been denied. Reason: Eliquis does not qualify for a lower co-pay under Medicare Part D. Eliquis is at a tier 3 and cannot be lowered any further as a brand name medication.   I called the pt and made him aware of the denial. He states that he cannot afford Eliquis and that he has an appointment scheduled with Dr Lovena Le next week on 8/19 at 11 am in the [place] office. He states that he has enough Eliquis to last until that visit. He is requesting a less expensive medication.  I will forward this message to Dr Lovena Le and the [facility] staff so they will be aware of this Eliquis tier exception denial.

## 2018-07-29 NOTE — Telephone Encounter (Signed)
**Note De-Identified Haevyn Ury Obfuscation** I called the pts [facility], and was advised that Eliquis is costing the pt $140.41 for a 30 day supply and that a PA is not required at this time. I called [facility] and did a Eliquis tier exception over the phone with [facility]. Per [facility] this tier exception has been sent for pharmacy review and that they will notify us of determination by fax.

## 2018-08-04 ENCOUNTER — Encounter: Payer: Self-pay | Admitting: Internal Medicine

## 2018-08-04 ENCOUNTER — Ambulatory Visit: Payer: Medicare Other | Admitting: Internal Medicine

## 2018-08-04 VITALS — BP 124/68 | HR 62 | Ht 68.0 in | Wt 165.0 lb

## 2018-08-04 DIAGNOSIS — I4891 Unspecified atrial fibrillation: Secondary | ICD-10-CM

## 2018-08-04 MED ORDER — PROPAFENONE HCL 225 MG PO TABS: 225.0000 mg | ORAL_TABLET | Freq: Three times a day (TID) | ORAL | 11 refills | Status: DC

## 2018-08-04 NOTE — Addendum Note (Signed)
Addended by: Levonne Hubert on: 08/04/2018 11:51 AM   Modules accepted: Orders

## 2018-08-04 NOTE — Patient Instructions (Signed)
Medication Instructions:  Your physician has recommended you make the following change in your medication:  Propafenone 225 mg Three Times Daily    Labwork: NONE   Testing/Procedures: NONE   Follow-Up: Your physician wants you to follow-up in: 9 Months with Dr. Lovena Le.  You will receive a reminder letter in the mail two months in advance. If you don't receive a letter, please call our office to schedule the follow-up appointment.   Any Other Special Instructions Will Be Listed Below (If Applicable).     If you need a refill on your cardiac medications before your next appointment, please call your pharmacy.  Thank you for choosing [facility]!

## 2018-08-04 NOTE — Progress Notes (Signed)
HPI [name] returns today for followup of PAF. He was initially placed on flecainide but developed GERD symptoms which persisted despite medical therapy. I switched him to rhythmol. In the interim, he has had some breakthrough arrhythmias and his dose was increased but he did not take the higher dose. Too expensive. A repeat ECG was obtained which was unchanged.  Allergies  Allergen Reactions  . Morphine And Related Nausea And Vomiting  . Propofol Nausea And Vomiting     Current Outpatient Medications  Medication Sig Dispense Refill  . alfuzosin (UROXATRAL) 10 MG 24 hr tablet Take 10 mg by mouth daily.    [name] ALPRAZolam (XANAX) 1 MG tablet Take 1 mg by mouth at bedtime.  0  . apixaban (ELIQUIS) 5 MG TABS tablet Take 1 tablet (5 mg total) by mouth 2 (two) times daily.    . baclofen (LIORESAL) 10 MG tablet Take 1 tablet (10 mg total) by mouth 3 (three) times daily. (Patient taking differently: Take 10 mg by mouth 3 (three) times daily as needed for muscle spasms. ) 60 each 0  . Cholecalciferol (VITAMIN D3) 2000 units capsule Take 2,000 Units by mouth daily.    [name] docusate sodium (COLACE) 100 MG capsule Take 200 mg by mouth daily.    [name] gabapentin (NEURONTIN) 400 MG capsule Take 400 mg by mouth 2 (two) times daily.   2  . GAVILYTE-N WITH FLAVOR PACK 420 g solution Take 4,000 mLs by mouth once.  0  . HYDROcodone-acetaminophen (NORCO) 10-325 MG tablet Take 1 tablet by mouth every 6 (six) hours as needed for moderate pain.     . magnesium oxide (MAG-OX) 400 MG tablet Take 400 mg by mouth daily.     . Misc Natural Products (GLUCOSAMINE CHONDROITIN TRIPLE) TABS Take 1 tablet by mouth daily.    . Omega-3 Fatty Acids (FISH OIL) 1000 MG CAPS Take 1,000 mg by mouth daily.     . pantoprazole (PROTONIX) 40 MG tablet Take 40 mg by mouth daily.     . Probiotic CAPS Take 1 capsule by mouth daily.    . propafenone (RYTHMOL SR) 325 MG 12 hr capsule Take 1 capsule (325 mg total) by mouth 2 (two) times  daily. 180 capsule 3  . tamsulosin (FLOMAX) 0.4 MG CAPS capsule Take 0.4 mg by mouth daily.  11  . vitamin B-12 (CYANOCOBALAMIN) 500 MCG tablet Take 500 mcg by mouth daily.     No current facility-administered medications for this visit.      Past Medical History:  Diagnosis Date  . A-fib ([place])   . Arthritis    "all over my body" (06/17/2017)  . BPH (benign prostatic hyperplasia)    takes Proscar daily  . Chronic back pain    DDD; "upper and lower" (06/17/2017)  . Constipation    takes Colace daily  . DDD (degenerative disc disease), cervical   . DVT (deep venous thrombosis) (HCC)    left peroneal vein 07/2017 (in setting of recent left TKA)  . Dysrhythmia    history of Atrial fibrillation, no long has this  . Enlarged prostate   . GERD (gastroesophageal reflux disease)    takes Nexium daily  . H/O hiatal hernia   . History of colon polyps    benign  . History of gastric ulcer   . Insomnia    takes Xanax nightly  . Joint capsule tear    left knee  . Joint pain   .  Joint swelling   . Macular degeneration   . Muscle spasm of back    takes Valium daily as needed  . PAF (paroxysmal atrial fibrillation) ([place])   . Peripheral neuropathy    takes Gabapentin daily  . PONV (postoperative nausea and vomiting)   . Sleep apnea    pt does not wear a cpap  . Urinary hesitancy   . Weakness    numbness and tingling    ROS:   All systems reviewed and negative except as noted in the HPI.   Past Surgical History:  Procedure Laterality Date  . ANTERIOR CERVICAL DECOMP/DISCECTOMY FUSION  ~ 1991   "took bone from hip"  . ANTERIOR CERVICAL DECOMP/DISCECTOMY FUSION  X 2   "put plate in"  . ANTERIOR CERVICAL DISCECTOMY  <11/2005    5-6 and 4-5./notes 05/02/2011  . APPENDECTOMY    . BACK SURGERY    . BIOPSY  05/30/2018   Procedure: BIOPSY;  Surgeon: [name], MD;  [facility];  Service: Endoscopy;;  esophagus  . CARDIAC CATHETERIZATION    . CARPAL TUNNEL RELEASE  Bilateral 10/2005 - 11/2005   right-left [facility] 05/02/2011  . CATARACT EXTRACTION W/PHACO Right 05/24/2014   Procedure: CATARACT EXTRACTION PHACO AND INTRAOCULAR LENS PLACEMENT (IOC);  Surgeon: [name], MD;  Location: [facility];  Service: Ophthalmology;  Laterality: Right;  CDE 6.74  . CATARACT EXTRACTION W/PHACO Left 06/17/2014   Procedure: CATARACT EXTRACTION PHACO AND INTRAOCULAR LENS PLACEMENT (IOC);  Surgeon: [name], MD;  Location: [facility];  Service: Ophthalmology;  Laterality: Left;  CDE:4.65  . COLONOSCOPY WITH ESOPHAGOGASTRODUODENOSCOPY (EGD) N/A 01/22/2013   Procedure: COLONOSCOPY WITH ESOPHAGOGASTRODUODENOSCOPY (EGD);  Surgeon: [name], MD;  [facility];  Service: Endoscopy;  Laterality: N/A;  140  . COLONOSCOPY WITH PROPOFOL N/A 05/30/2018   Procedure: COLONOSCOPY WITH PROPOFOL;  Surgeon: [name], MD;  [facility];  Service: Endoscopy;  Laterality: N/A;  . ESOPHAGOGASTRODUODENOSCOPY (EGD) WITH PROPOFOL N/A 05/30/2018   Procedure: ESOPHAGOGASTRODUODENOSCOPY (EGD) WITH PROPOFOL;  Surgeon: [name], MD;  [facility];  Service: Endoscopy;  Laterality: N/A;  . EYE SURGERY Bilateral    "cleaned film w/laser; since my 1st cataract OR"  . JOINT REPLACEMENT    . POSTERIOR LUMBAR FUSION     "put a plate in"  . QUADRICEPS TENDON REPAIR Left 07/01/2017  . QUADRICEPS TENDON REPAIR Left 07/01/2017   Procedure: REPAIR QUADRICEP TENDON;  Surgeon: [name], MD;  Location: [facility];  Service: Orthopedics;  Laterality: Left;  . SHOULDER ARTHROSCOPY Bilateral   . SPINAL CORD STIMULATOR INSERTION N/A 09/23/2015   Procedure: LUMBAR SPINAL CORD STIMULATOR INSERTION;  Surgeon: [name], MD;  Location: [facility];  Service: Neurosurgery;  Laterality: N/A;  LUMBAR SPINAL CORD STIMULATOR INSERTION  . TOTAL KNEE ARTHROPLASTY Left 06/17/2017  . TOTAL KNEE ARTHROPLASTY Left 06/17/2017   Procedure: TOTAL KNEE ARTHROPLASTY;  Surgeon: [name], MD;   Location: [facility];  Service: Orthopedics;  Laterality: Left;  . TOTAL KNEE ARTHROPLASTY Right 12/16/2017   Procedure: TOTAL KNEE ARTHROPLASTY;  Surgeon: [name], MD;  Location: [facility];  Service: Orthopedics;  Laterality: Right;     History reviewed. No pertinent family history.   Social History   Socioeconomic History  . Marital status: Significant Other    Spouse name: Not on file  . Number of children: Not on file  . Years of education: Not on file  . Highest education level: Not  on file  Occupational History  . Not on file  Social Needs  . Financial resource strain: Not on file  . Food insecurity:    Worry: Not on file    Inability: Not on file  . Transportation needs:    Medical: Not on file    Non-medical: Not on file  Tobacco Use  . Smoking status: Former Smoker    Packs/day: 1.00    Years: 30.00    Pack years: 30.00    Types: Cigarettes    Last attempt to quit: 01/14/1987    Years since quitting: 31.5  . Smokeless tobacco: Former Systems developer    Types: Chew  . Tobacco comment: quit smoking  & chewing by 1987  Substance and Sexual Activity  . Alcohol use: Yes    Alcohol/week: 15.0 standard drinks    Types: 3 Glasses of wine, 12 Cans of beer per week    Comment: 2-3 times weekly   . Drug use: No  . Sexual activity: Not Currently  Lifestyle  . Physical activity:    Days per week: Not on file    Minutes per session: Not on file  . Stress: Not on file  Relationships  . Social connections:    Talks on phone: Not on file    Gets together: Not on file    Attends religious service: Not on file    Active member of club or organization: Not on file    Attends meetings of clubs or organizations: Not on file    Relationship status: Not on file  . Intimate partner violence:    Fear of current or ex partner: Not on file    Emotionally abused: Not on file    Physically abused: Not on file    Forced sexual activity: Not on file  Other Topics Concern  . Not on file    Social History Narrative  . Not on file     BP 124/68   Pulse 62   Ht 5\' 8"  (1.727 m)   Wt 165 lb (74.8 kg)   SpO2 96%   BMI 25.09 kg/m   Physical Exam:  Well appearing NAD HEENT: Unremarkable Neck:  No JVD, no thyromegally Lymphatics:  No adenopathy Back:  No CVA tenderness Lungs:  Clear HEART:  Regular rate rhythm, no murmurs, no rubs, no clicks Abd:  soft, positive bowel sounds, no organomegally, no rebound, no guarding Ext:  2 plus pulses, no edema, no cyanosis, no clubbing Skin:  No rashes no nodules Neuro:  CN II through XII intact, motor grossly intact  EKG -none   Assess/Plan: 1. Atrial fib - we will increase regular propafenone to 225 tid. He will come back for an ECG in 2 weeks and I will see him back in several months. 2. DVT - he will continue Eliquis.  Mikle Bosworth.D.

## 2018-08-12 NOTE — Telephone Encounter (Signed)
Eliquis 360 faxed on 07/14/18

## 2018-08-19 ENCOUNTER — Ambulatory Visit (INDEPENDENT_AMBULATORY_CARE_PROVIDER_SITE_OTHER): Payer: Medicare Other

## 2018-08-19 VITALS — BP 130/76 | HR 62 | Ht 68.0 in | Wt 165.0 lb

## 2018-08-19 DIAGNOSIS — I4891 Unspecified atrial fibrillation: Secondary | ICD-10-CM

## 2018-08-19 NOTE — Patient Instructions (Signed)
Continue medications as directed.   We will call you if Dr.Taylor has any changes     Thank you for choosing [facility] !

## 2018-08-19 NOTE — Progress Notes (Signed)
Pt feels well, no sx's of fast or irregular heart beat.Has missed mid day dose twice since starting.will keep pill dispenser with him now

## 2018-09-01 DIAGNOSIS — R7301 Impaired fasting glucose: Secondary | ICD-10-CM | POA: Diagnosis not present

## 2018-09-01 DIAGNOSIS — E782 Mixed hyperlipidemia: Secondary | ICD-10-CM | POA: Diagnosis not present

## 2018-09-01 DIAGNOSIS — G5603 Carpal tunnel syndrome, bilateral upper limbs: Secondary | ICD-10-CM | POA: Diagnosis not present

## 2018-09-01 DIAGNOSIS — I4891 Unspecified atrial fibrillation: Secondary | ICD-10-CM | POA: Diagnosis not present

## 2018-09-01 DIAGNOSIS — K219 Gastro-esophageal reflux disease without esophagitis: Secondary | ICD-10-CM | POA: Diagnosis not present

## 2018-09-16 DIAGNOSIS — R030 Elevated blood-pressure reading, without diagnosis of hypertension: Secondary | ICD-10-CM | POA: Diagnosis not present

## 2018-09-16 DIAGNOSIS — M5416 Radiculopathy, lumbar region: Secondary | ICD-10-CM | POA: Diagnosis not present

## 2018-09-16 DIAGNOSIS — M961 Postlaminectomy syndrome, not elsewhere classified: Secondary | ICD-10-CM | POA: Diagnosis not present

## 2018-09-16 DIAGNOSIS — Z9689 Presence of other specified functional implants: Secondary | ICD-10-CM | POA: Diagnosis not present

## 2018-09-26 DIAGNOSIS — H04123 Dry eye syndrome of bilateral lacrimal glands: Secondary | ICD-10-CM | POA: Diagnosis not present

## 2018-09-26 DIAGNOSIS — H40033 Anatomical narrow angle, bilateral: Secondary | ICD-10-CM | POA: Diagnosis not present

## 2018-09-30 DIAGNOSIS — I4891 Unspecified atrial fibrillation: Secondary | ICD-10-CM | POA: Diagnosis not present

## 2018-09-30 DIAGNOSIS — Z0000 Encounter for general adult medical examination without abnormal findings: Secondary | ICD-10-CM | POA: Diagnosis not present

## 2018-09-30 DIAGNOSIS — E782 Mixed hyperlipidemia: Secondary | ICD-10-CM | POA: Diagnosis not present

## 2018-09-30 DIAGNOSIS — K219 Gastro-esophageal reflux disease without esophagitis: Secondary | ICD-10-CM | POA: Diagnosis not present

## 2018-09-30 DIAGNOSIS — G894 Chronic pain syndrome: Secondary | ICD-10-CM | POA: Diagnosis not present

## 2018-10-07 DIAGNOSIS — R3911 Hesitancy of micturition: Secondary | ICD-10-CM | POA: Diagnosis not present

## 2018-11-10 ENCOUNTER — Other Ambulatory Visit (HOSPITAL_COMMUNITY): Payer: Self-pay | Admitting: Adult Health Nurse Practitioner

## 2018-11-10 ENCOUNTER — Other Ambulatory Visit (INDEPENDENT_AMBULATORY_CARE_PROVIDER_SITE_OTHER): Payer: Self-pay | Admitting: Otolaryngology

## 2018-11-10 ENCOUNTER — Ambulatory Visit (INDEPENDENT_AMBULATORY_CARE_PROVIDER_SITE_OTHER): Payer: Medicare Other | Admitting: Otolaryngology

## 2018-11-10 ENCOUNTER — Ambulatory Visit (HOSPITAL_COMMUNITY)
Admission: RE | Admit: 2018-11-10 | Discharge: 2018-11-10 | Disposition: A | Discharge status: Home / Self Care | Payer: Medicare Other | Source: Ambulatory Visit | Attending: Adult Health Nurse Practitioner | Admitting: Adult Health Nurse Practitioner

## 2018-11-10 DIAGNOSIS — R0602 Shortness of breath: Secondary | ICD-10-CM | POA: Insufficient documentation

## 2018-11-10 DIAGNOSIS — E782 Mixed hyperlipidemia: Secondary | ICD-10-CM | POA: Diagnosis not present

## 2018-11-10 DIAGNOSIS — E559 Vitamin D deficiency, unspecified: Secondary | ICD-10-CM | POA: Diagnosis not present

## 2018-11-10 DIAGNOSIS — R531 Weakness: Secondary | ICD-10-CM

## 2018-11-10 DIAGNOSIS — D519 Vitamin B12 deficiency anemia, unspecified: Secondary | ICD-10-CM | POA: Diagnosis not present

## 2018-11-10 DIAGNOSIS — R070 Pain in throat: Secondary | ICD-10-CM

## 2018-11-10 DIAGNOSIS — K219 Gastro-esophageal reflux disease without esophagitis: Secondary | ICD-10-CM | POA: Diagnosis not present

## 2018-11-10 DIAGNOSIS — R1310 Dysphagia, unspecified: Secondary | ICD-10-CM

## 2018-11-10 DIAGNOSIS — R1312 Dysphagia, oropharyngeal phase: Secondary | ICD-10-CM | POA: Diagnosis not present

## 2018-11-10 DIAGNOSIS — R079 Chest pain, unspecified: Secondary | ICD-10-CM | POA: Diagnosis not present

## 2018-11-10 DIAGNOSIS — I4891 Unspecified atrial fibrillation: Secondary | ICD-10-CM | POA: Diagnosis not present

## 2018-11-17 ENCOUNTER — Ambulatory Visit (HOSPITAL_COMMUNITY)
Admission: RE | Admit: 2018-11-17 | Discharge: 2018-11-17 | Disposition: A | Discharge status: Home / Self Care | Payer: Medicare Other | Source: Ambulatory Visit | Attending: Otolaryngology | Admitting: Otolaryngology

## 2018-11-17 DIAGNOSIS — R1310 Dysphagia, unspecified: Secondary | ICD-10-CM | POA: Diagnosis not present

## 2018-11-17 DIAGNOSIS — K449 Diaphragmatic hernia without obstruction or gangrene: Secondary | ICD-10-CM | POA: Diagnosis not present

## 2018-11-18 DIAGNOSIS — R3911 Hesitancy of micturition: Secondary | ICD-10-CM | POA: Diagnosis not present

## 2018-11-25 ENCOUNTER — Telehealth: Payer: Self-pay | Admitting: Internal Medicine

## 2018-11-25 NOTE — Telephone Encounter (Signed)
Returned pt call. No answer, left message for opt to return call.

## 2018-11-25 NOTE — Telephone Encounter (Signed)
Pt states he's having some chest pain and has gone into Afib 4 times in the last 3-4 weeks, he's having SOB and is "giving out"

## 2018-11-25 NOTE — Telephone Encounter (Signed)
Pt came in office. [place] office scheduled him an appointment @ [place] office tomorrow. Pt was pleased with that date & time.

## 2018-11-27 ENCOUNTER — Encounter: Payer: Self-pay | Admitting: Adult Health

## 2018-11-27 ENCOUNTER — Ambulatory Visit: Payer: Medicare Other | Admitting: Adult Health

## 2018-11-27 VITALS — BP 108/70 | HR 57 | Ht 68.0 in | Wt 158.0 lb

## 2018-11-27 DIAGNOSIS — R079 Chest pain, unspecified: Secondary | ICD-10-CM

## 2018-11-27 DIAGNOSIS — R000 Tachycardia, unspecified: Secondary | ICD-10-CM | POA: Diagnosis not present

## 2018-11-27 DIAGNOSIS — R0602 Shortness of breath: Secondary | ICD-10-CM

## 2018-11-27 DIAGNOSIS — I4891 Unspecified atrial fibrillation: Secondary | ICD-10-CM

## 2018-11-27 NOTE — Patient Instructions (Signed)
Medication Instructions:  NO CHANGES- Your physician recommends that you continue on your current medications as directed. Please refer to the Current Medication list given to you today.  If you need a refill on your cardiac medications before your next appointment, please call your pharmacy.  Labwork: If you have labs (blood work) drawn today and your tests are completely normal, you will receive your results only by [place] (if you have MyChart) -OR- A paper copy in the mail.  If you have any lab test that is abnormal or we need to change your treatment, we will call you to review these results.  Testing/Procedures: Your physician has recommended that you wear an event monitor-in [place]. Event monitors are medical devices that record the heart's electrical activity. Doctors most often [place] these monitors to diagnose arrhythmias. Arrhythmias are problems with the speed or rhythm of the heartbeat. The monitor is a small, portable device. You can wear one while you do your normal daily activities. This is usually used to diagnose what is causing palpitations/syncope (passing out).  Your physician has requested that you have an Exercise Myoview-at [facility]. A cardiac stress test is a cardiological test that measures the heart's ability to respond to external stress in a controlled clinical environment. The stress response is induced by exercise (exercise-treadmill).   Follow-Up: You may see Cristopher Peru, MD -[place] only or one of the following Advanced Practice Providers on your designated Care Team:  Bernerd Pho, PA-C ([place]) -[place], PA-C ([place])    Jory Sims, DNP, [place], you and your health needs are our priority.  As part of our continuing mission to provide you with exceptional heart care, we have created designated Provider Care Teams.  These Care Teams include your primary Cardiologist (physician) and Advanced  Practice Providers (APPs -  Physician Assistants and Nurse Practitioners) who all work together to provide you with the care you need, when you need it.

## 2018-11-27 NOTE — Progress Notes (Signed)
Cardiology Office Note   Date:  11/27/2018   ID:  Christopher [name], Christopher [name] Mar 07, 1946, MRN [id number]  PCP:  Celene Squibb, MD  Cardiologist: Johnsie Cancel  EP: Lovena Le  Chief Complaint  Patient presents with  . Chest Pain  . Shortness of Breath  . Atrial Fibrillation     History of Present Illness: Christopher [name] is a 72 y.o. male who presents for ongoing assessment and management of Atrial fibrillation. He comes today with complaints of chest pain with exertion, and worsening dyspnea. He is usually very active working outside, carries wood, works on his land.  However, walking only about 150 feet or more, he becomes very tired. He has to climb stairs to his house to go inside, he cannot do so. He has to rest and then begin to climb the stairs first. When he gets inside is is exhausted. Has chest pressure. Goes away when he rests. He has been experiencing this on progressive basis for the last 4-5 months.   He denies rapid HR or irregular HR associated with these symptoms He is controlled on propafenone 225 mg TID. However, he has a watch that tracks his HR and he has recorded HR of 140-119 at times. Other times he has rates in the 50's and 60's.   Past Medical History:  Diagnosis Date  . A-fib ([place])   . Arthritis    "all over my body" (06/17/2017)  . BPH (benign prostatic hyperplasia)    takes Proscar daily  . Chronic back pain    DDD; "upper and lower" (06/17/2017)  . Constipation    takes Colace daily  . DDD (degenerative disc disease), cervical   . DVT (deep venous thrombosis) (HCC)    left peroneal vein 07/2017 (in setting of recent left TKA)  . Dysrhythmia    history of Atrial fibrillation, no long has this  . Enlarged prostate   . GERD (gastroesophageal reflux disease)    takes Nexium daily  . H/O hiatal hernia   . History of colon polyps    benign  . History of gastric ulcer   . Insomnia    takes Xanax nightly  . Joint capsule tear    left knee  . Joint pain   . Joint swelling     . Macular degeneration   . Muscle spasm of back    takes Valium daily as needed  . PAF (paroxysmal atrial fibrillation) ([place])   . Peripheral neuropathy    takes Gabapentin daily  . PONV (postoperative nausea and vomiting)   . Sleep apnea    pt does not wear a cpap  . Urinary hesitancy   . Weakness    numbness and tingling    Past Surgical History:  Procedure Laterality Date  . ANTERIOR CERVICAL DECOMP/DISCECTOMY FUSION  ~ 1991   "took bone from hip"  . ANTERIOR CERVICAL DECOMP/DISCECTOMY FUSION  X 2   "put plate in"  . ANTERIOR CERVICAL DISCECTOMY  <11/2005    5-6 and 4-5./notes 05/02/2011  . APPENDECTOMY    . BACK SURGERY    . BIOPSY  05/30/2018   Procedure: BIOPSY;  Surgeon: [name], MD;  [facility];  Service: Endoscopy;;  esophagus  . CARDIAC CATHETERIZATION    . CARPAL TUNNEL RELEASE Bilateral 10/2005 - 11/2005   right-left [facility] Endo 05/02/2011  . CATARACT EXTRACTION W/PHACO Right 05/24/2014   Procedure: CATARACT EXTRACTION PHACO AND INTRAOCULAR LENS PLACEMENT (IOC);  Surgeon: [name], MD;  [facility];  Service: Ophthalmology;  Laterality: Right;  CDE 6.74  . CATARACT EXTRACTION W/PHACO Left 06/17/2014   Procedure: CATARACT EXTRACTION PHACO AND INTRAOCULAR LENS PLACEMENT (IOC);  Surgeon: [name], MD;  Location: [facility];  Service: Ophthalmology;  Laterality: Left;  CDE:4.65  . COLONOSCOPY WITH ESOPHAGOGASTRODUODENOSCOPY (EGD) N/A 01/22/2013   Procedure: COLONOSCOPY WITH ESOPHAGOGASTRODUODENOSCOPY (EGD);  Surgeon: [name], MD;  [facility];  Service: Endoscopy;  Laterality: N/A;  140  . COLONOSCOPY WITH PROPOFOL N/A 05/30/2018   Procedure: COLONOSCOPY WITH PROPOFOL;  Surgeon: [name], MD;  [facility];  Service: Endoscopy;  Laterality: N/A;  . ESOPHAGOGASTRODUODENOSCOPY (EGD) WITH PROPOFOL N/A 05/30/2018   Procedure: ESOPHAGOGASTRODUODENOSCOPY (EGD) WITH PROPOFOL;  Surgeon: [name], MD;  [facility];  Service: Endoscopy;  Laterality: N/A;  . EYE SURGERY Bilateral    "cleaned film w/laser; since my 1st cataract OR"  . JOINT REPLACEMENT    . POSTERIOR LUMBAR FUSION     "put a plate in"  . QUADRICEPS TENDON REPAIR Left 07/01/2017  . QUADRICEPS TENDON REPAIR Left 07/01/2017   Procedure: REPAIR QUADRICEP TENDON;  Surgeon: [name], MD;  Location: [facility];  Service: Orthopedics;  Laterality: Left;  . SHOULDER ARTHROSCOPY Bilateral   . SPINAL CORD STIMULATOR INSERTION N/A 09/23/2015   Procedure: LUMBAR SPINAL CORD STIMULATOR INSERTION;  Surgeon: [name], MD;  Location: [place] [facility];  Service: Neurosurgery;  Laterality: N/A;  LUMBAR SPINAL CORD STIMULATOR INSERTION  . TOTAL KNEE ARTHROPLASTY Left 06/17/2017  . TOTAL KNEE ARTHROPLASTY Left 06/17/2017   Procedure: TOTAL KNEE ARTHROPLASTY;  Surgeon: [name], MD;  Location: [facility];  Service: Orthopedics;  Laterality: Left;  . TOTAL KNEE ARTHROPLASTY Right 12/16/2017   Procedure: TOTAL KNEE ARTHROPLASTY;  Surgeon: [name], MD;  Location: [facility];  Service: Orthopedics;  Laterality: Right;     Current Outpatient Medications  Medication Sig Dispense Refill  . alfuzosin (UROXATRAL) 10 MG 24 hr tablet Take 10 mg by mouth daily.    [name] ALPRAZolam (XANAX) 1 MG tablet Take 1 mg by mouth at bedtime.  0  . apixaban (ELIQUIS) 5 MG TABS tablet Take 1 tablet (5 mg total) by mouth 2 (two) times daily.    [name] docusate sodium (COLACE) 100 MG capsule Take 200 mg by mouth daily.    . finasteride (PROSCAR) 5 MG tablet Take 5 mg by mouth daily.  3  . gabapentin (NEURONTIN) 400 MG capsule Take 400 mg by mouth 2 (two) times daily.   2  . HYDROcodone-acetaminophen (NORCO) 10-325 MG tablet Take 1 tablet by mouth every 6 (six) hours as needed for moderate pain.     . pantoprazole (PROTONIX) 40 MG tablet Take 40 mg by mouth daily.     . pravastatin (PRAVACHOL) 20 MG tablet Take 20 mg by mouth at bedtime.  2  . propafenone (RYTHMOL) 225 MG tablet Take 1  tablet (225 mg total) by mouth every 8 (eight) hours. 90 tablet 11  . sulfamethoxazole-trimethoprim (BACTRIM DS,SEPTRA DS) 800-160 MG tablet Take 1 tablet by mouth 2 (two) times daily.      No current facility-administered medications for this visit.     Allergies:   Morphine and related and Propofol    Social History:  The patient  reports that he quit smoking about 31 years ago. His smoking use included cigarettes. He has a 30.00 pack-year smoking history. He has quit using smokeless tobacco.  His smokeless tobacco use included chew. He reports  current alcohol use of about 15.0 standard drinks of alcohol per week. He reports that he does not use drugs.   Family History:  The patient's family history is not on file.    ROS: All other systems are reviewed and negative. Unless otherwise mentioned in H&P    PHYSICAL EXAM: VS:  BP 108/70   Pulse (!) 57   Ht 5\' 8"  (1.727 m)   Wt 158 lb (71.7 kg)   BMI 24.02 kg/m  , BMI Body mass index is 24.02 kg/m. GEN: Well nourished, well developed, in no acute distress HEENT: normal Neck: no JVD, carotid bruits, or masses Cardiac:RRR; no murmurs, rubs, or gallops,no edema  Respiratory:  Clear to auscultation bilaterally, normal work of breathing GI: soft, nontender, nondistended, + BS MS: no deformity or atrophy Skin: warm and dry, no rash Neuro:  Strength and sensation are intact Psych: euthymic mood, full affect   EKG:  Sinus bradycardia, with 1st degree AV block, incomplete RBBB, rate of 57 bpm.   Recent Labs: 12/06/2017: ALT 18 03/05/2018: TSH 2.035 05/23/2018: Platelets 254 05/30/2018: BUN 13; Creatinine, Ser 0.80; Hemoglobin 13.6; Potassium 3.7; Sodium 140    Lipid Panel    Component Value Date/Time   CHOL 193 11/04/2017 1120   TRIG 72 11/04/2017 1120   HDL 52 11/04/2017 1120   CHOLHDL 3.7 11/04/2017 1120   VLDL 14 11/04/2017 1120   LDLCALC 127 (H) 11/04/2017 1120      Wt Readings from Last 3 Encounters:  11/27/18 158 lb  (71.7 kg)  08/19/18 165 lb (74.8 kg)  08/04/18 165 lb (74.8 kg)    Other studies Reviewed: Left ventricle: The cavity size was normal. Systolic function was   normal. The estimated ejection fraction was in the range of 55%   to 60%. Wall motion was normal; there were no regional wall   motion abnormalities. Left ventricular diastolic function   parameters were normal. - Left atrium: The atrium was mildly dilated. - Atrial septum: A patent foramen ovale cannot be excluded.  ASSESSMENT AND PLAN:  1. Chest Pain: Worrisome for angina symptoms. Occurs with walking over 150 feet, unable to climb stairs without chest pressure or DOE. I will plan a NM stress test for diagnostic/prognostic purposes. He will avoid over strenuous activities until test is completed.   2.Atrial fib: Heart rate is elevated at times according to his watch recordings I will place a cardiac monitor on for 2 weeks, that can be placed after stress test. He will report any new symptoms to Dr. Lovena Le if they occur.   Current medicines are reviewed at length with the patient today.    Labs/ tests ordered today include: Cardiac Monitor and NM Stress test   Phill Myron. [place] Pugh, ANP, [facility]   11/27/2018 11:32 AM    [address] Office [phone] Fax [phone]

## 2018-12-02 ENCOUNTER — Encounter (HOSPITAL_COMMUNITY): Payer: Medicare Other | Attending: Internal Medicine

## 2018-12-02 ENCOUNTER — Ambulatory Visit (HOSPITAL_COMMUNITY)
Admission: RE | Admit: 2018-12-02 | Discharge: 2018-12-02 | Disposition: A | Discharge status: Home / Self Care | Payer: Medicare Other | Source: Ambulatory Visit | Attending: Adult Health | Admitting: Adult Health

## 2018-12-02 ENCOUNTER — Encounter (HOSPITAL_COMMUNITY): Payer: Self-pay

## 2018-12-02 DIAGNOSIS — R0602 Shortness of breath: Secondary | ICD-10-CM | POA: Diagnosis not present

## 2018-12-02 DIAGNOSIS — R079 Chest pain, unspecified: Secondary | ICD-10-CM | POA: Diagnosis not present

## 2018-12-02 LAB — NM MYOCAR MULTI W/SPECT W/WALL MOTION / EF
Estimated workload: 5.2 METS
Exercise duration (min): 3 min
Exercise duration (sec): 18 s
LV dias vol: 71 mL (ref 62–150)
LV sys vol: 34 mL
MPHR: 147 {beats}/min
Peak HR: 86 {beats}/min
Percent HR: 58 %
RATE: 0.41
RPE: 15
Rest HR: 51 {beats}/min
SDS: 1
SRS: 0
SSS: 1
TID: 0.9

## 2018-12-02 MED ORDER — TECHNETIUM TC 99M TETROFOSMIN IV KIT
10.0000 | PACK | Freq: Once | INTRAVENOUS | Status: AC | PRN
  Administered 2018-12-02: 9.1 via INTRAVENOUS

## 2018-12-02 MED ORDER — TECHNETIUM TC 99M TETROFOSMIN IV KIT
30.0000 | PACK | Freq: Once | INTRAVENOUS | Status: AC | PRN
  Administered 2018-12-02: 27 via INTRAVENOUS

## 2018-12-02 MED ORDER — REGADENOSON 0.4 MG/5ML IV SOLN
INTRAVENOUS | Status: AC
  Administered 2018-12-02: 0.4 mg via INTRAVENOUS
  Filled 2018-12-02: qty 5

## 2018-12-02 MED ORDER — SODIUM CHLORIDE 0.9% FLUSH
INTRAVENOUS | Status: AC
  Administered 2018-12-02: 10 mL via INTRAVENOUS
  Filled 2018-12-02: qty 10

## 2018-12-05 DIAGNOSIS — M25561 Pain in right knee: Secondary | ICD-10-CM | POA: Diagnosis not present

## 2018-12-05 DIAGNOSIS — M961 Postlaminectomy syndrome, not elsewhere classified: Secondary | ICD-10-CM | POA: Diagnosis not present

## 2018-12-05 DIAGNOSIS — I10 Essential (primary) hypertension: Secondary | ICD-10-CM | POA: Diagnosis not present

## 2018-12-05 DIAGNOSIS — M5416 Radiculopathy, lumbar region: Secondary | ICD-10-CM | POA: Diagnosis not present

## 2018-12-05 DIAGNOSIS — Z9689 Presence of other specified functional implants: Secondary | ICD-10-CM | POA: Diagnosis not present

## 2018-12-08 NOTE — Progress Notes (Signed)
Cardiology Office Note   Date:  12/09/2018   ID:  Christopher [name], DOB 12-Jul-1946, MRN [id number]  PCP:  Celene Squibb, MD  Cardiologist: Dr.Taylor  Chief Complaint  Patient presents with  . Atrial Fibrillation  . Shortness of Breath     History of Present Illness: Christopher [name] is a 72 y.o. male who presents for follow up after complaining of chest pain on last visit. He has a history of atrial fibrillation with complaints of elevated HR > 120 bpm. He was planned for a NM stress test which was found to be normal without evidence of ischemia.  A 2 week cardiac monitor for 2 weeks after stress test.   He continues to complain of DOE. He has not had any recurrent irregular HR or palpitations. He has not been evaluated by pulmonary for his breathing status.   Past Medical History:  Diagnosis Date  . A-fib (Christopher [name])   . Arthritis    "all over my body" (06/17/2017)  . BPH (benign prostatic hyperplasia)    takes Proscar daily  . Chronic back pain    DDD; "upper and lower" (06/17/2017)  . Constipation    takes Colace daily  . DDD (degenerative disc disease), cervical   . DVT (deep venous thrombosis) (HCC)    left peroneal vein 07/2017 (in setting of recent left TKA)  . Dysrhythmia    history of Atrial fibrillation, no long has this  . Enlarged prostate   . GERD (gastroesophageal reflux disease)    takes Nexium daily  . H/O hiatal hernia   . History of colon polyps    benign  . History of gastric ulcer   . Insomnia    takes Xanax nightly  . Joint capsule tear    left knee  . Joint pain   . Joint swelling   . Macular degeneration   . Muscle spasm of back    takes Valium daily as needed  . PAF (paroxysmal atrial fibrillation) ([place])   . Peripheral neuropathy    takes Gabapentin daily  . PONV (postoperative nausea and vomiting)   . Sleep apnea    pt does not wear a cpap  . Urinary hesitancy   . Weakness    numbness and tingling    Past Surgical History:  Procedure Laterality  Date  . ANTERIOR CERVICAL DECOMP/DISCECTOMY FUSION  ~ 1991   "took bone from hip"  . ANTERIOR CERVICAL DECOMP/DISCECTOMY FUSION  X 2   "put plate in"  . ANTERIOR CERVICAL DISCECTOMY  <11/2005    5-6 and 4-5./notes 05/02/2011  . APPENDECTOMY    . BACK SURGERY    . BIOPSY  05/30/2018   Procedure: BIOPSY;  Surgeon: [name], MD;  [facility];  Service: Endoscopy;;  esophagus  . CARDIAC CATHETERIZATION    . CARPAL TUNNEL RELEASE Bilateral 10/2005 - 11/2005   right-left Archie Endo 05/02/2011  . CATARACT EXTRACTION W/PHACO Right 05/24/2014   Procedure: CATARACT EXTRACTION PHACO AND INTRAOCULAR LENS PLACEMENT (IOC);  Surgeon: [name], MD;  Location: [facility];  Service: Ophthalmology;  Laterality: Right;  CDE 6.74  . CATARACT EXTRACTION W/PHACO Left 06/17/2014   Procedure: CATARACT EXTRACTION PHACO AND INTRAOCULAR LENS PLACEMENT (IOC);  Surgeon: [name], MD;  Location: [facility];  Service: Ophthalmology;  Laterality: Left;  CDE:4.65  . COLONOSCOPY WITH ESOPHAGOGASTRODUODENOSCOPY (EGD) N/A 01/22/2013   Procedure: COLONOSCOPY WITH ESOPHAGOGASTRODUODENOSCOPY (EGD);  Surgeon: [name], MD;  [facility];  Service: Endoscopy;  Laterality: N/A;  140  . COLONOSCOPY WITH PROPOFOL N/A 05/30/2018   Procedure: COLONOSCOPY WITH PROPOFOL;  Surgeon: [name], MD;  [facility];  Service: Endoscopy;  Laterality: N/A;  . ESOPHAGOGASTRODUODENOSCOPY (EGD) WITH PROPOFOL N/A 05/30/2018   Procedure: ESOPHAGOGASTRODUODENOSCOPY (EGD) WITH PROPOFOL;  Surgeon: [name], MD;  [facility];  Service: Endoscopy;  Laterality: N/A;  . EYE SURGERY Bilateral    "cleaned film w/laser; since my 1st cataract OR"  . JOINT REPLACEMENT    . POSTERIOR LUMBAR FUSION     "put a plate in"  . QUADRICEPS TENDON REPAIR Left 07/01/2017  . QUADRICEPS TENDON REPAIR Left 07/01/2017   Procedure: REPAIR QUADRICEP TENDON;  Surgeon: [name], MD;  Location: [place];  Service:  Orthopedics;  Laterality: Left;  . SHOULDER ARTHROSCOPY Bilateral   . SPINAL CORD STIMULATOR INSERTION N/A 09/23/2015   Procedure: LUMBAR SPINAL CORD STIMULATOR INSERTION;  Surgeon: [name], MD;  Location: [place] [facility];  Service: Neurosurgery;  Laterality: N/A;  LUMBAR SPINAL CORD STIMULATOR INSERTION  . TOTAL KNEE ARTHROPLASTY Left 06/17/2017  . TOTAL KNEE ARTHROPLASTY Left 06/17/2017   Procedure: TOTAL KNEE ARTHROPLASTY;  Surgeon: [name], MD;  Location: [place];  Service: Orthopedics;  Laterality: Left;  . TOTAL KNEE ARTHROPLASTY Right 12/16/2017   Procedure: TOTAL KNEE ARTHROPLASTY;  Surgeon: [name], MD;  Location: [place];  Service: Orthopedics;  Laterality: Right;     Current Outpatient Medications  Medication Sig Dispense Refill  . alfuzosin (UROXATRAL) 10 MG 24 hr tablet Take 10 mg by mouth daily.    [name] ALPRAZolam (XANAX) 1 MG tablet Take 1 mg by mouth at bedtime.  0  . apixaban (ELIQUIS) 5 MG TABS tablet Take 1 tablet (5 mg total) by mouth 2 (two) times daily.    [name] docusate sodium (COLACE) 100 MG capsule Take 200 mg by mouth daily.    . finasteride (PROSCAR) 5 MG tablet Take 5 mg by mouth daily.  3  . gabapentin (NEURONTIN) 400 MG capsule Take 400 mg by mouth 2 (two) times daily.   2  . HYDROcodone-acetaminophen (NORCO) 10-325 MG tablet Take 1 tablet by mouth every 6 (six) hours as needed for moderate pain.     . pantoprazole (PROTONIX) 40 MG tablet Take 40 mg by mouth daily.     . pravastatin (PRAVACHOL) 20 MG tablet Take 20 mg by mouth at bedtime.  2  . propafenone (RYTHMOL) 225 MG tablet Take 1 tablet (225 mg total) by mouth every 8 (eight) hours. 90 tablet 11  . sulfamethoxazole-trimethoprim (BACTRIM DS,SEPTRA DS) 800-160 MG tablet Take 1 tablet by mouth 2 (two) times daily.      No current facility-administered medications for this visit.     Allergies:   Morphine and related and Propofol    Social History:  The patient  reports that he quit smoking about 31  years ago. His smoking use included cigarettes. He has a 30.00 pack-year smoking history. He has quit using smokeless tobacco.  His smokeless tobacco use included chew. He reports current alcohol use of about 15.0 standard drinks of alcohol per week. He reports that he does not use drugs.   Family History:  The patient's family history is not on file.    ROS: All other systems are reviewed and negative. Unless otherwise mentioned in H&P    PHYSICAL EXAM: VS:  BP 114/64   Pulse (!) 59   Ht 5\' 8"  (1.727 m)   Wt  160 lb (72.6 kg)   BMI 24.33 kg/m  , BMI Body mass index is 24.33 kg/m. GEN: Well nourished, well developed, in no acute distress HEENT: normal Neck: no JVD, carotid bruits, or masses Cardiac: RRR; no murmurs, rubs, or gallops,no edema  Respiratory:  Clear to auscultation bilaterally, normal work of breathing GI: soft, nontender, nondistended, + BS MS: no deformity or atrophy Skin: warm and dry, no rash Neuro:  Strength and sensation are intact Psych: euthymic mood, full affect   EKG: Not completed this office visit.   Recent Labs: 03/05/2018: TSH 2.035 05/23/2018: Platelets 254 05/30/2018: BUN 13; Creatinine, Ser 0.80; Hemoglobin 13.6; Potassium 3.7; Sodium 140    Lipid Panel    Component Value Date/Time   CHOL 193 11/04/2017 1120   TRIG 72 11/04/2017 1120   HDL 52 11/04/2017 1120   CHOLHDL 3.7 11/04/2017 1120   VLDL 14 11/04/2017 1120   LDLCALC 127 (H) 11/04/2017 1120      Wt Readings from Last 3 Encounters:  12/09/18 160 lb (72.6 kg)  11/27/18 158 lb (71.7 kg)  08/19/18 165 lb (74.8 kg)      Other studies Reviewed: Echocardiogram Aug 22, 2017 Left ventricle: The cavity size was normal. Systolic function was   normal. The estimated ejection fraction was in the range of 55%   to 60%. Wall motion was normal; there were no regional wall   motion abnormalities. Left ventricular diastolic function   parameters were normal. - Left atrium: The atrium was mildly  dilated. - Atrial septum: A patent foramen ovale cannot be excluded.   ASSESSMENT AND PLAN:  1.  PAF: Continue on rate control with propanolol and anticoagulation with Eliquis. He denies recurrent palpitations.   2. Chronic DOE: I have ordered PFTs and referral to pulmonary for further evaluation and management.    3. Hypercholesterolemia: Continue statin therapy with pravastatin.    Current medicines are reviewed at length with the patient today.    Labs/ tests ordered today include: PFT's   Phill Myron. West Pugh, ANP, AACC   12/09/2018 1:10 PM    [place] [address] Office [phone] Fax [phone]

## 2018-12-09 ENCOUNTER — Ambulatory Visit: Payer: Medicare Other | Admitting: Adult Health

## 2018-12-09 ENCOUNTER — Encounter: Payer: Self-pay | Admitting: Adult Health

## 2018-12-09 VITALS — BP 114/64 | HR 59 | Ht 68.0 in | Wt 160.0 lb

## 2018-12-09 DIAGNOSIS — I4891 Unspecified atrial fibrillation: Secondary | ICD-10-CM

## 2018-12-09 DIAGNOSIS — R0602 Shortness of breath: Secondary | ICD-10-CM | POA: Diagnosis not present

## 2018-12-09 NOTE — Patient Instructions (Signed)
Medication Instructions:  NO CHANGES- Your physician recommends that you continue on your current medications as directed. Please refer to the Current Medication list given to you today.  If you need a refill on your cardiac medications before your next appointment, please call your pharmacy.  Labwork: NONE ORDERED  If you have labs (blood work) drawn today and your tests are completely normal, you will receive your results only by MyChart Message (if you have MyChart) -OR- A paper copy in the mail.  If you have any lab test that is abnormal or we need to change your treatment, we will call you to review these results.  Testing/Procedures: Your physician has recommended that you have a pulmonary function test. Pulmonary Function Tests are a group of tests that measure how well air moves in and out of your lungs.  Special Instructions: REFERRAL TO PULMONARY  Follow-Up: You will need a follow up appointment in 3 months.  You may see Cristopher Peru, MD/NISHAN or one of the following Advanced Practice Providers on your designated Care Team:  Truitt Merle, NP  Cecilie Kicks, NP   Kathyrn Drown, NP  At [facility], you and your health needs are our priority.  As part of our continuing mission to provide you with exceptional heart care, we have created designated Provider Care Teams.  These Care Teams include your primary Cardiologist (physician) and Advanced Practice Providers (APPs -  Physician Assistants and Nurse Practitioners) who all work together to provide you with the care you need, when you need it.

## 2018-12-15 ENCOUNTER — Ambulatory Visit: Payer: Medicare Other | Admitting: Internal Medicine

## 2018-12-15 ENCOUNTER — Encounter: Payer: Self-pay | Admitting: Internal Medicine

## 2018-12-15 VITALS — BP 128/66 | HR 71 | Ht 67.0 in | Wt 159.0 lb

## 2018-12-15 DIAGNOSIS — R0600 Dyspnea, unspecified: Secondary | ICD-10-CM

## 2018-12-15 DIAGNOSIS — R0609 Other forms of dyspnea: Secondary | ICD-10-CM | POA: Diagnosis not present

## 2018-12-15 LAB — BASIC METABOLIC PANEL
BUN: 13 mg/dL (ref 6–23)
CO2: 29 mEq/L (ref 19–32)
Calcium: 9.5 mg/dL (ref 8.4–10.5)
Chloride: 104 mEq/L (ref 96–112)
Creatinine, Ser: 0.97 mg/dL (ref 0.40–1.50)
GFR: 80.43 mL/min (ref >60.00)
Glucose, Bld: 86 mg/dL (ref 70–99)
Potassium: 4.2 mEq/L (ref 3.5–5.1)
Sodium: 139 mEq/L (ref 135–145)

## 2018-12-15 LAB — CBC WITH DIFFERENTIAL/PLATELET
Basophils Absolute: 0.1 10*3/uL (ref 0.0–0.1)
Basophils Relative: 1.6 % (ref 0.0–3.0)
Eosinophils Absolute: 0.1 10*3/uL (ref 0.0–0.7)
Eosinophils Relative: 1.5 % (ref 0.0–5.0)
HCT: 39.8 % (ref 39.0–52.0)
Hemoglobin: 13.4 g/dL (ref 13.0–17.0)
Lymphocytes Relative: 34.8 % (ref 12.0–46.0)
Lymphs Abs: 1.7 10*3/uL (ref 0.7–4.0)
MCHC: 33.7 g/dL (ref 30.0–36.0)
MCV: 92.1 fl (ref 78.0–100.0)
Monocytes Absolute: 0.6 10*3/uL (ref 0.1–1.0)
Monocytes Relative: 11.1 % (ref 3.0–12.0)
Neutro Abs: 2.6 10*3/uL (ref 1.4–7.7)
Neutrophils Relative %: 51 % (ref 43.0–77.0)
Platelets: 309 10*3/uL (ref 150.0–400.0)
RBC: 4.32 Mil/uL (ref 4.22–5.81)
RDW: 13.5 % (ref 11.5–15.5)
WBC: 5 10*3/uL (ref 4.0–10.5)

## 2018-12-15 LAB — BRAIN NATRIURETIC PEPTIDE: Pro B Natriuretic peptide (BNP): 70 pg/mL (ref 0.0–100.0)

## 2018-12-15 LAB — TSH: TSH: 1.93 u[IU]/mL (ref 0.35–4.50)

## 2018-12-15 NOTE — Patient Instructions (Signed)
Be sure to take protonix Take 30-60 min before first meal of the day   GERD (REFLUX)  is an extremely common cause of respiratory symptoms just like yours , many times with no obvious heartburn at all.    It can be treated with medication, but also with lifestyle changes including elevation of the head of your bed (ideally with 6 inch  bed blocks),  Smoking cessation, avoidance of late meals, excessive alcohol, and avoid fatty foods, chocolate, peppermint, colas, red wine, and acidic juices such as orange juice.  NO MINT OR MENTHOL PRODUCTS SO NO COUGH DROPS   USE SUGARLESS CANDY INSTEAD (Jolley ranchers or Stover's or Life Savers) or even ice chips will also do - the key is to swallow to prevent all throat clearing. NO OIL BASED VITAMINS - use powdered substitutes.     Please remember to go to the lab department   for your tests - we will call you with the results when they are available.      Please schedule a follow up office visit a day or two after your PFT"s on the 15th of January   with all medications /inhalers/ solutions in hand so we can verify exactly what you are taking. This includes all medications from all doctors and over the counters

## 2018-12-15 NOTE — Progress Notes (Signed)
Christopher [name], male    DOB: [birth year],   MRN: [id number]    Brief patient profile:  66 yowm quit smoking 1988 with good ex tolerance at that point with new onset doe 08/2018 with neg cards w/u so referred to pulmonary clinic 12/15/2018 by Beckie Busing p stress Myoview showed no ischemia but pt c/o sob and leg pain 12/02/18     History of Present Illness  12/15/2018  Pulmonary/ 1st office eval/Johnrobert Foti  Chief Complaint  Patient presents with  . Pulmonary Consult    Referred by Jory Sims, NP.  Pt c/o SOB for the past 3 months, esp worse x 1 month. He gets winded walking up stairs.  He notices CP occ mainly in the evenings.  very active until L knee replacement req 06/2017 then R knee Dec 3846 complicated by afib but despite this did fine on ETT 03/19/18 and gradually worse doe since then s assoc cough / does have occ sscp p eating and is supposed to be on ppi but takes it pc and not watching diet at all.  Present doe = MMRC1 = can walk nl pace, flat grade, can't hurry or go uphills or steps s sob     No obvious day to day or daytime variability or assoc excess/ purulent sputum or mucus plugs or hemoptysis or   chest tightness, subjective wheeze or overt sinus ymptoms.   Sleeping fine flat without nocturnal  or early am exacerbation  of respiratory  c/o's or need for noct saba. Also denies any obvious fluctuation of symptoms with weather or environmental changes or other aggravating or alleviating factors except as outlined above   No unusual exposure hx or h/o childhood pna/ asthma or knowledge of premature birth.  Current Allergies, Complete Past Medical History, Past Surgical History, Family History, and Social History were reviewed in [employer] record.  ROS  The following are not active complaints unless bolded Hoarseness, sore throat, dysphagia, dental problems, itching, sneezing,  nasal congestion or discharge of excess mucus or purulent secretions, ear  ache,   fever, chills, sweats, unintended wt loss or wt gain, classically pleuritic or exertional cp,  orthopnea pnd or arm/hand swelling  or leg swelling, presyncope, palpitations, abdominal pain, anorexia, nausea, vomiting, diarrhea  or change in bowel habits or change in bladder habits, change in stools or change in urine, dysuria, hematuria,  rash, arthralgias, visual complaints, headache, numbness, weakness or ataxia or problems with walking or coordination,  change in mood or  memory.                Past Medical History:  Diagnosis Date  . A-fib ([place])   . Arthritis    "all over my body" (06/17/2017)  . BPH (benign prostatic hyperplasia)    takes Proscar daily  . Chronic back pain    DDD; "upper and lower" (06/17/2017)  . Constipation    takes Colace daily  . DDD (degenerative disc disease), cervical   . DVT (deep venous thrombosis) (H[facility])    left peroneal vein 07/2017 (in setting of recent left TKA)  . Dysrhythmia    history of Atrial fibrillation, no long has this  . Enlarged prostate   . GERD (gastroesophageal reflux disease)    takes Nexium daily  . H/O hiatal hernia   . History of colon polyps    benign  . History of gastric ulcer   . Insomnia    takes Xanax nightly  . Joint capsule tear  left knee  . Joint pain   . Joint swelling   . Macular degeneration   . Muscle spasm of back    takes Valium daily as needed  . PAF (paroxysmal atrial fibrillation) ([place])   . Peripheral neuropathy    takes Gabapentin daily  . PONV (postoperative nausea and vomiting)   . Sleep apnea    pt does not wear a cpap  . Urinary hesitancy   . Weakness    numbness and tingling    Outpatient Medications Prior to Visit  Medication Sig Dispense Refill  . alfuzosin (UROXATRAL) 10 MG 24 hr tablet Take 10 mg by mouth daily.    [name] ALPRAZolam (XANAX) 1 MG tablet Take 1 mg by mouth at bedtime.  0  . apixaban (ELIQUIS) 5 MG TABS tablet Take 1 tablet (5 mg total) by mouth 2 (two) times daily.     [name] docusate sodium (COLACE) 100 MG capsule Take 200 mg by mouth daily.    . finasteride (PROSCAR) 5 MG tablet Take 5 mg by mouth daily.  3  . gabapentin (NEURONTIN) 400 MG capsule Take 400 mg by mouth 2 (two) times daily.   2  . HYDROcodone-acetaminophen (NORCO) 10-325 MG tablet Take 1 tablet by mouth every 6 (six) hours as needed for moderate pain.     . pantoprazole (PROTONIX) 40 MG tablet Take 40 mg by mouth daily.     . pravastatin (PRAVACHOL) 20 MG tablet Take 20 mg by mouth at bedtime.  2  . propafenone (RYTHMOL) 225 MG tablet Take 1 tablet (225 mg total) by mouth every 8 (eight) hours. 90 tablet 11  . sulfamethoxazole-trimethoprim (BACTRIM DS,SEPTRA DS) 800-160 MG tablet Take 1 tablet by mouth 2 (two) times daily.      No facility-administered medications prior to visit.      Objective:     BP 128/66 (BP Location: Left Arm, Cuff Size: Normal)   Pulse 71   Ht 5\' 7"  (1.702 m)   Wt 159 lb (72.1 kg)   SpO2 97%   BMI 24.90 kg/m   SpO2: 97 %  RA  Pleasant amb wm nad   HEENT: nl  , turbinates bilaterally, and oropharynx. Nl external ear canals without cough reflex   NECK :  without JVD/Nodes/TM/ nl carotid upstrokes bilaterally   LUNGS: no acc muscle use,  Nl contour chest which is clear to A and P bilaterally without cough on insp or exp maneuvers   CV:  RRR  no s3 or murmur or increase in P2, and no edema   ABD:  soft and nontender with nl inspiratory excursion in the supine position. No bruits or organomegaly appreciated, bowel sounds nl  MS:  Nl gait/ ext warm with classic mod severe djd changes both hands  calf tenderness, cyanosis or clubbing No obvious joint restrictions   SKIN: warm and dry without lesions    NEURO:  alert, approp, nl sensorium with  no motor or cerebellar deficits apparent.     I personally reviewed images and agree with radiology impression as follows:  CXR:   11/10/18  Streaky opacities in the left lung base may represent atelectasis  or bronchitic changes.  Also Dg Es 11/17/18 Small hiatal hernia. Associated gastroesophageal reflux was visualized during the exam.   Labs ordered/ reviewed:      Chemistry      Component Value Date/Time   NA 139 12/15/2018 1238   K 4.2 12/15/2018 1238   CL 104 12/15/2018 1238  CO2 29 12/15/2018 1238   BUN 13 12/15/2018 1238   CREATININE 0.97 12/15/2018 1238      Component Value Date/Time   CALCIUM 9.5 12/15/2018 1238   ALKPHOS 65 12/06/2017 0815   AST 23 12/06/2017 0815   ALT 18 12/06/2017 0815   BILITOT 1.2 12/06/2017 0815        Lab Results  Component Value Date   WBC 5.0 12/15/2018   HGB 13.4 12/15/2018   HCT 39.8 12/15/2018   MCV 92.1 12/15/2018   PLT 309.0 12/15/2018         Lab Results  Component Value Date   TSH 1.93 12/15/2018     Lab Results  Component Value Date   PROBNP 70.0 12/15/2018             Assessment   DOE (dyspnea on exertion) 12/15/2018   Walked RA  2 laps @ 247ft each @ nl pace  stopped due to  Sob with sats still 97% at end - 12/15/2018 rec max rx for gerd and f/u pfts 11/30/18   Symptoms are  disproportionate to objective findings and not clear to what extent this is actually a pulmonary  problem but pt does appear to have difficult to sort out respiratory symptoms of unknown origin for which  DDX  = almost all start with A and  include Adherence, Ace Inhibitors, Acid Reflux, Active Sinus Disease, Alpha 1 Antitripsin deficiency, Anxiety masquerading as Airways dz,  ABPA,  Allergy(esp in young), Aspiration (esp in elderly), Adverse effects of meds,  Active smoking or Vaping, A bunch of PE's/clot burden (a few small clots can't cause this syndrome unless there is already severe underlying pulm or vascular dz with poor reserve),  Anemia or thyroid disorder, plus two Bs  = Bronchiectasis and Beta blocker use..and one C= CHF    Adherence is always the initial "prime suspect" and is a multilayered concern that requires a "trust but  verify" approach in every patient - starting with knowing how to use medications, especially inhalers, correctly, keeping up with refills and understanding the fundamental difference between maintenance and prns vs those medications only taken for a very short course and then stopped and not refilled.  - return with all meds in hand using a trust but verify approach to confirm accurate Medication  Reconciliation The principal here is that until we are certain that the  patients are doing what we've asked, it makes no sense to ask them to do more.    ? Acid (or non-acid) GERD > always difficult to exclude as up to 75% of pts in some series report no assoc GI/ Heartburn symptoms and he clearly has gerd by recent Dg Es> rec max (24h)  acid suppression and diet restrictions/ reviewed and instructions given in writing.   ? Asthma/ copd > there is no variability to suggest asthma at this point and even less likely copd now based on: When respiratory symptoms begin or become refractory well after a patient reports complete smoking cessation,  Especially when this wasn't the case while they were smoking, a red flag is raised based on the work of Dr Kris Mouton which states:  if you quit smoking when your best day FEV1 is still well preserved it is highly unlikely you will progress to severe disease.  That is to say, once the smoking stops,  the symptoms should not suddenly erupt or markedly worsen.  If so, the differential diagnosis should include  obesity/deconditioning,  LPR/Reflux/Aspiration syndromes,  occult CHF, or  especially side effect of medications commonly used in this population.   -  See Jerrye Bushy rx/ reconditioning ex rec   ? Alpha one AT > no reason to screen if can't prove copd on pfts   ? Adverse drug effects  > none of the usual suspects including amiodarone listed.  ? Anemia/ thyroid dz > excluded today  ? A bunch of PE's > unlikely as already on Eliquis  ? CHF > no evidence of ischemia by  recent Myoview and BNP less than 100 so this seems very unlikely.     >>>?  Follow-up will be within a day or 2 of his PFTs planned for mid January to see if he would benefit from any bronchodilators at this point.    Total time devoted to counseling  > 50 % of initial 60 min office visit:  review case with pt/directly observe ambulatory O2 saturation study/ Discussion of options/alternatives/ personally creating written customized instructions  in presence of pt  then going over those specific  Instructions directly with the pt including how to use all of the meds but in particular covering each new medication in detail and the difference between the maintenance= "automatic" meds and the prns using an action plan format for the latter (If this problem/symptom => do that organization reading Left to right).  Please see AVS from this visit for a full list of these instructions which I personally wrote for this pt and  are unique to this visit.             Christinia Gully, MD 12/15/2018

## 2018-12-16 ENCOUNTER — Encounter: Payer: Self-pay | Admitting: Internal Medicine

## 2018-12-16 NOTE — Progress Notes (Signed)
Spoke with pt and notified of results per Dr. Wert. Pt verbalized understanding and denied any questions. 

## 2018-12-16 NOTE — Assessment & Plan Note (Addendum)
12/15/2018   Walked RA  2 laps @ 253ft each @ nl pace  stopped due to  Sob with sats still 97% at end - 12/15/2018 rec max rx for gerd and f/u pfts 11/30/18   Symptoms are  disproportionate to objective findings and not clear to what extent this is actually a pulmonary  problem but pt does appear to have difficult to sort out respiratory symptoms of unknown origin for which  DDX  = almost all start with A and  include Adherence, Ace Inhibitors, Acid Reflux, Active Sinus Disease, Alpha 1 Antitripsin deficiency, Anxiety masquerading as Airways dz,  ABPA,  Allergy(esp in young), Aspiration (esp in elderly), Adverse effects of meds,  Active smoking or Vaping, A bunch of PE's/clot burden (a few small clots can't cause this syndrome unless there is already severe underlying pulm or vascular dz with poor reserve),  Anemia or thyroid disorder, plus two Bs  = Bronchiectasis and Beta blocker use..and one C= CHF    Adherence is always the initial "prime suspect" and is a multilayered concern that requires a "trust but verify" approach in every patient - starting with knowing how to use medications, especially inhalers, correctly, keeping up with refills and understanding the fundamental difference between maintenance and prns vs those medications only taken for a very short course and then stopped and not refilled.  - return with all meds in hand using a trust but verify approach to confirm accurate Medication  Reconciliation The principal here is that until we are certain that the  patients are doing what we've asked, it makes no sense to ask them to do more.    ? Acid (or non-acid) GERD > always difficult to exclude as up to 75% of pts in some series report no assoc GI/ Heartburn symptoms and he clearly has gerd by recent Dg Es> rec max (24h)  acid suppression and diet restrictions/ reviewed and instructions given in writing.   ? Asthma/ copd > there is no variability to suggest asthma at this point and even less  likely copd now based on: When respiratory symptoms begin or become refractory well after a patient reports complete smoking cessation,  Especially when this wasn't the case while they were smoking, a red flag is raised based on the work of Dr Kris Mouton which states:  if you quit smoking when your best day FEV1 is still well preserved it is highly unlikely you will progress to severe disease.  That is to say, once the smoking stops,  the symptoms should not suddenly erupt or markedly worsen.  If so, the differential diagnosis should include  obesity/deconditioning,  LPR/Reflux/Aspiration syndromes,  occult CHF, or  especially side effect of medications commonly used in this population.   -  See Jerrye Bushy rx/ reconditioning ex rec   ? Alpha one AT > no reason to screen if can't prove copd on pfts   ? Adverse drug effects  > none of the usual suspects including amiodarone listed.  ? Anemia/ thyroid dz > excluded today  ? A bunch of PE's > unlikely as already on Eliquis  ? CHF > no evidence of ischemia by recent Myoview and BNP less than 100 so this seems very unlikely.     >>>?  Follow-up will be within a day or 2 of his PFTs planned for mid January to see if he would benefit from any bronchodilators at this point.    Total time devoted to counseling  > 50 % of initial  60 min office visit:  review case with pt/directly observe ambulatory O2 saturation study/ Discussion of options/alternatives/ personally creating written customized instructions  in presence of pt  then going over those specific  Instructions directly with the pt including how to use all of the meds but in particular covering each new medication in detail and the difference between the maintenance= "automatic" meds and the prns using an action plan format for the latter (If this problem/symptom => do that organization reading Left to right).  Please see AVS from this visit for a full list of these instructions which I personally wrote  for this pt and  are unique to this visit.

## 2018-12-17 HISTORY — PX: CORONARY ARTERY BYPASS GRAFT: SHX141

## 2018-12-31 ENCOUNTER — Ambulatory Visit (HOSPITAL_COMMUNITY)
Admission: RE | Admit: 2018-12-31 | Discharge: 2018-12-31 | Disposition: A | Discharge status: Home / Self Care | Payer: Medicare Other | Source: Ambulatory Visit | Attending: Adult Health | Admitting: Adult Health

## 2018-12-31 ENCOUNTER — Ambulatory Visit: Payer: Medicare Other | Admitting: Internal Medicine

## 2018-12-31 ENCOUNTER — Encounter: Payer: Self-pay | Admitting: Internal Medicine

## 2018-12-31 VITALS — BP 108/60 | HR 62 | Ht 67.0 in | Wt 159.0 lb

## 2018-12-31 DIAGNOSIS — R0602 Shortness of breath: Secondary | ICD-10-CM | POA: Diagnosis not present

## 2018-12-31 DIAGNOSIS — I480 Paroxysmal atrial fibrillation: Secondary | ICD-10-CM

## 2018-12-31 LAB — PULMONARY FUNCTION TEST
DL/VA % pred: 83 %
DL/VA: 3.66 ml/min/mmHg/L
DLCO cor % pred: 65 %
DLCO cor: 18.58 ml/min/mmHg
DLCO unc % pred: 63 %
DLCO unc: 17.91 ml/min/mmHg
FEF 25-75 Post: 2.24 L/sec
FEF 25-75 Pre: 1.85 L/sec
FEF2575-%Change-Post: 21 %
FEF2575-%Pred-Post: 109 %
FEF2575-%Pred-Pre: 90 %
FEV1-%Change-Post: 4 %
FEV1-%Pred-Post: 90 %
FEV1-%Pred-Pre: 86 %
FEV1-Post: 2.5 L
FEV1-Pre: 2.4 L
FEV1FVC-%Change-Post: 5 %
FEV1FVC-%Pred-Pre: 102 %
FEV6-%Change-Post: 0 %
FEV6-%Pred-Post: 88 %
FEV6-%Pred-Pre: 87 %
FEV6-Post: 3.15 L
FEV6-Pre: 3.14 L
FEV6FVC-%Change-Post: 1 %
FEV6FVC-%Pred-Post: 106 %
FEV6FVC-%Pred-Pre: 104 %
FVC-%Change-Post: -1 %
FVC-%Pred-Post: 82 %
FVC-%Pred-Pre: 84 %
FVC-Post: 3.17 L
FVC-Pre: 3.21 L
Post FEV1/FVC ratio: 79 %
Post FEV6/FVC ratio: 99 %
Pre FEV1/FVC ratio: 75 %
Pre FEV6/FVC Ratio: 98 %
RV % pred: 102 %
RV: 2.4 L
TLC % pred: 84 %
TLC: 5.44 L

## 2018-12-31 MED ORDER — FUROSEMIDE 20 MG PO TABS: 20.0000 mg | ORAL_TABLET | Freq: Every day | ORAL | 3 refills | Status: DC

## 2018-12-31 MED ORDER — ALBUTEROL SULFATE (2.5 MG/3ML) 0.083% IN NEBU
2.5000 mg | INHALATION_SOLUTION | Freq: Once | RESPIRATORY_TRACT | Status: AC
  Administered 2018-12-31: 2.5 mg via RESPIRATORY_TRACT

## 2018-12-31 NOTE — Progress Notes (Signed)
HPI [name] returns today for followup of PAF. He was initially placed on flecainide but developed GERD symptoms which persisted despite medical therapy. I switched him to rhythmol. In the interim, he has had some breakthrough arrhythmias but has done fairly well with tid dosing of rhythmol. He thinks he has been out of rhythm 5-6 times. He mainly c/o dyspnea with exertion. He has been evaluated by Dr. Melvyn Novas after a normal echo and stress test. He did not desaturate with exertion on RA. He admits to dietary indiscretion.  Allergies  Allergen Reactions  . Morphine And Related Nausea And Vomiting  . Propofol Nausea And Vomiting     Current Outpatient Medications  Medication Sig Dispense Refill  . alfuzosin (UROXATRAL) 10 MG 24 hr tablet Take 10 mg by mouth daily.    [name] ALPRAZolam (XANAX) 1 MG tablet Take 1 mg by mouth at bedtime.  0  . apixaban (ELIQUIS) 5 MG TABS tablet Take 1 tablet (5 mg total) by mouth 2 (two) times daily.    [name] docusate sodium (COLACE) 100 MG capsule Take 200 mg by mouth daily.    . finasteride (PROSCAR) 5 MG tablet Take 5 mg by mouth daily.  3  . gabapentin (NEURONTIN) 400 MG capsule Take 400 mg by mouth 2 (two) times daily.   2  . HYDROcodone-acetaminophen (NORCO) 10-325 MG tablet Take 1 tablet by mouth every 6 (six) hours as needed for moderate pain.     . pantoprazole (PROTONIX) 40 MG tablet Take 40 mg by mouth daily.     . pravastatin (PRAVACHOL) 20 MG tablet Take 20 mg by mouth at bedtime.  2  . propafenone (RYTHMOL) 225 MG tablet Take 1 tablet (225 mg total) by mouth every 8 (eight) hours. 90 tablet 11   No current facility-administered medications for this visit.      Past Medical History:  Diagnosis Date  . A-fib ([place])   . Arthritis    "all over my body" (06/17/2017)  . BPH (benign prostatic hyperplasia)    takes Proscar daily  . Chronic back pain    DDD; "upper and lower" (06/17/2017)  . Constipation    takes Colace daily  . DDD (degenerative  disc disease), cervical   . DVT (deep venous thrombosis) ([facility])    left peroneal vein 07/2017 (in setting of recent left TKA)  . Dysrhythmia    history of Atrial fibrillation, no long has this  . Enlarged prostate   . GERD (gastroesophageal reflux disease)    takes Nexium daily  . H/O hiatal hernia   . History of colon polyps    benign  . History of gastric ulcer   . Insomnia    takes Xanax nightly  . Joint capsule tear    left knee  . Joint pain   . Joint swelling   . Macular degeneration   . Muscle spasm of back    takes Valium daily as needed  . PAF (paroxysmal atrial fibrillation) ([place])   . Peripheral neuropathy    takes Gabapentin daily  . PONV (postoperative nausea and vomiting)   . Sleep apnea    pt does not wear a cpap  . Urinary hesitancy   . Weakness    numbness and tingling    ROS:   All systems reviewed and negative except as noted in the HPI.   Past Surgical History:  Procedure Laterality Date  . ANTERIOR CERVICAL DECOMP/DISCECTOMY FUSION  ~ 1991   "  took bone from hip"  . ANTERIOR CERVICAL DECOMP/DISCECTOMY FUSION  X 2   "put plate in"  . ANTERIOR CERVICAL DISCECTOMY  <11/2005    5-6 and 4-5./notes 05/02/2011  . APPENDECTOMY    . BACK SURGERY    . BIOPSY  05/30/2018   Procedure: BIOPSY;  Surgeon: [name], MD;  Location[facility];  Service: Endoscopy;;  esophagus  . CARDIAC CATHETERIZATION    . CARPAL TUNNEL RELEASE Bilateral 10/2005 - 11/2005   right-left [facility] Endo 05/02/2011  . CATARACT EXTRACTION W/PHACO Right 05/24/2014   Procedure: CATARACT EXTRACTION PHACO AND INTRAOCULAR LENS PLACEMENT (IOC);  Surgeon: [name], MD;  Location: [facility];  Service: Ophthalmology;  Laterality: Right;  CDE 6.74  . CATARACT EXTRACTION W/PHACO Left 06/17/2014   Procedure: CATARACT EXTRACTION PHACO AND INTRAOCULAR LENS PLACEMENT (IOC);  Surgeon: [name], MD;  Location: [facility];  Service: Ophthalmology;  Laterality: Left;  CDE:4.65  . COLONOSCOPY WITH  ESOPHAGOGASTRODUODENOSCOPY (EGD) N/A 01/22/2013   Procedure: COLONOSCOPY WITH ESOPHAGOGASTRODUODENOSCOPY (EGD);  Surgeon: [name], MD;  [facility];  Service: Endoscopy;  Laterality: N/A;  140  . COLONOSCOPY WITH PROPOFOL N/A 05/30/2018   Procedure: COLONOSCOPY WITH PROPOFOL;  Surgeon: [name], MD;  [facility];  Service: Endoscopy;  Laterality: N/A;  . ESOPHAGOGASTRODUODENOSCOPY (EGD) WITH PROPOFOL N/A 05/30/2018   Procedure: ESOPHAGOGASTRODUODENOSCOPY (EGD) WITH PROPOFOL;  Surgeon: [name], MD;  [facility];  Service: Endoscopy;  Laterality: N/A;  . EYE SURGERY Bilateral    "cleaned film w/laser; since my 1st cataract OR"  . JOINT REPLACEMENT    . POSTERIOR LUMBAR FUSION     "put a plate in"  . QUADRICEPS TENDON REPAIR Left 07/01/2017  . QUADRICEPS TENDON REPAIR Left 07/01/2017   Procedure: REPAIR QUADRICEP TENDON;  Surgeon: [name], MD;  Location: [facility];  Service: Orthopedics;  Laterality: Left;  . SHOULDER ARTHROSCOPY Bilateral   . SPINAL CORD STIMULATOR INSERTION N/A 09/23/2015   Procedure: LUMBAR SPINAL CORD STIMULATOR INSERTION;  Surgeon: [name], MD;  Location: [facility];  Service: Neurosurgery;  Laterality: N/A;  LUMBAR SPINAL CORD STIMULATOR INSERTION  . TOTAL KNEE ARTHROPLASTY Left 06/17/2017  . TOTAL KNEE ARTHROPLASTY Left 06/17/2017   Procedure: TOTAL KNEE ARTHROPLASTY;  Surgeon: [name], MD;  Location: [facility];  Service: Orthopedics;  Laterality: Left;  . TOTAL KNEE ARTHROPLASTY Right 12/16/2017   Procedure: TOTAL KNEE ARTHROPLASTY;  Surgeon: [name], MD;  Location: [facility];  Service: Orthopedics;  Laterality: Right;     History reviewed. No pertinent family history.   Social History   Socioeconomic History  . Marital status: Significant Other    Spouse name: Not on file  . Number of children: Not on file  . Years of education: Not on file  . Highest education level: Not on file  Occupational  History  . Not on file  Social Needs  . Financial resource strain: Not on file  . Food insecurity:    Worry: Not on file    Inability: Not on file  . Transportation needs:    Medical: Not on file    Non-medical: Not on file  Tobacco Use  . Smoking status: Former Smoker    Packs/day: 1.00    Years: 30.00    Pack years: 30.00    Types: Cigarettes    Last attempt to quit: 01/14/1987    Years since quitting: 31.9  . Smokeless tobacco: Former Systems developer    Types:  Chew  . Tobacco comment: quit smoking  & chewing by 1987  Substance and Sexual Activity  . Alcohol use: Yes    Alcohol/week: 15.0 standard drinks    Types: 3 Glasses of wine, 12 Cans of beer per week    Comment: 2-3 times weekly   . Drug use: No  . Sexual activity: Not Currently  Lifestyle  . Physical activity:    Days per week: Not on file    Minutes per session: Not on file  . Stress: Not on file  Relationships  . Social connections:    Talks on phone: Not on file    Gets together: Not on file    Attends religious service: Not on file    Active member of club or organization: Not on file    Attends meetings of clubs or organizations: Not on file    Relationship status: Not on file  . Intimate partner violence:    Fear of current or ex partner: Not on file    Emotionally abused: Not on file    Physically abused: Not on file    Forced sexual activity: Not on file  Other Topics Concern  . Not on file  Social History Narrative  . Not on file     BP 108/60 (BP Location: Right Arm)   Pulse 62   Ht 5\' 7"  (1.702 m)   Wt 159 lb (72.1 kg)   SpO2 97%   BMI 24.90 kg/m   Physical Exam:  Well appearing 73 yo man, NAD HEENT: Unremarkable Neck:  6 cm JVD, no thyromegally Lymphatics:  No adenopathy Back:  No CVA tenderness Lungs:  Clear with no wheezes HEART:  Regular rate rhythm, no murmurs, no rubs, no clicks Abd:  soft, positive bowel sounds, no organomegally, no rebound, no guarding Ext:  2 plus pulses, no  edema, no cyanosis, no clubbing Skin:  No rashes no nodules Neuro:  CN II through XII intact, motor grossly intact   Assess/Plan: 1. Dyspnea - I suspect that this is multifactorial. I have discussed the etiologies. I suspect he has some lung disease from years of smoking but also think that he has a component of diastolic dysfunction. I have recommended he reduce his sodium intake, walk every day, and start lasix 20 mg daily. I will see him back in 6 months. If he is not better with the lasix then he can stop it.  2. PAf - he is mostly maintaiinng NSR. Continue Rhythmol. 3. HTN - his blood pressure is well controlled. 4. Dyslipidemia - he will continue his pravachol.  Mikle Bosworth.D.

## 2018-12-31 NOTE — Patient Instructions (Signed)
Medication Instructions:  Your physician has recommended you make the following change in your medication:  Start Lasix 20 mg Daily ( You may stop if you see that it is not helping)   If you need a refill on your cardiac medications before your next appointment, please call your pharmacy.   Lab work: NONE   If you have labs (blood work) drawn today and your tests are completely normal, you will receive your results only by: [name] MyChart Message (if you have MyChart) OR . A paper copy in the mail If you have any lab test that is abnormal or we need to change your treatment, we will call you to review the results.  Testing/Procedures: NONE   Follow-Up: At [facility], you and your health needs are our priority.  As part of our continuing mission to provide you with exceptional heart care, we have created designated Provider Care Teams.  These Care Teams include your primary Cardiologist (physician) and Advanced Practice Providers (APPs -  Physician Assistants and Nurse Practitioners) who all work together to provide you with the care you need, when you need it. You will need a follow up appointment in 6 months.  Please call our office 2 months in advance to schedule this appointment.  You may see None or one of the following Advanced Practice Providers on your designated Care Team:   Chanetta Marshall, NP . Tommye Standard, PA-C  Any Other Special Instructions Will Be Listed Below (If Applicable). Thank you for choosing [facility]!

## 2019-01-05 ENCOUNTER — Ambulatory Visit: Payer: Medicare Other | Admitting: Internal Medicine

## 2019-01-05 ENCOUNTER — Encounter: Payer: Self-pay | Admitting: Internal Medicine

## 2019-01-05 VITALS — BP 114/62 | HR 73 | Wt 154.8 lb

## 2019-01-05 DIAGNOSIS — R0600 Dyspnea, unspecified: Secondary | ICD-10-CM

## 2019-01-05 DIAGNOSIS — R0609 Other forms of dyspnea: Secondary | ICD-10-CM | POA: Diagnosis not present

## 2019-01-05 DIAGNOSIS — R05 Cough: Secondary | ICD-10-CM

## 2019-01-05 DIAGNOSIS — R058 Other specified cough: Secondary | ICD-10-CM | POA: Insufficient documentation

## 2019-01-05 MED ORDER — PANTOPRAZOLE SODIUM 40 MG PO TBEC: DELAYED_RELEASE_TABLET | ORAL | 2 refills | Status: DC

## 2019-01-05 MED ORDER — GABAPENTIN 400 MG PO CAPS: ORAL_CAPSULE | ORAL | Status: AC

## 2019-01-05 NOTE — Assessment & Plan Note (Addendum)
Onset spring 2019  Dg Es 11/17/18 Small hiatal hernia. Associated gastroesophageal reflux was visualized during the exam.   Of the three most common causes of  Sub-acute / recurrent or chronic cough, only one (GERD)  can actually contribute to/ trigger  the other two (asthma and post nasal drip syndrome)  and perpetuate the cylce of cough.  While not intuitively obvious, many patients with chronic low grade reflux do not cough until there is a primary insult that disturbs the protective epithelial barrier and exposes sensitive nerve endings.   This is typically viral but can due to PNDS and  either may apply here.      >>>  The point is that once this occurs, it is difficult to eliminate the cycle  using anything but a maximally effective acid suppression regimen at least in the short run, accompanied by an appropriate diet to address non acid GERD ie ppi bid ac plus max rx with gabapentin = 400 mg tid then see ent if not better as does not appear to be a pulmonary source for the cough  (would suggest wfu/ Dr Joya Gaskins who has the most experience with uacs)   I had an extended discussion with the patient  / wife  reviewing all relevant studies completed to date and  lasting 15 to 20 minutes of a 25 minute visit  which included directly observing ambulatory 02 saturation study documented in a/p section of  today's  office note.  Each maintenance medication was reviewed in detail including most importantly the difference between maintenance and prns and under what circumstances the prns are to be triggered using an action plan format that is not reflected in the computer generated alphabetically organized AVS.     Please see AVS for specific instructions unique to this visit that I personally wrote and verbalized to the the pt in detail and then reviewed with pt  by my nurse highlighting any changes in therapy recommended at today's visit .

## 2019-01-05 NOTE — Patient Instructions (Signed)
Protonix (pantoprzole) 40 mg Take 30- 60 min before your first and last meals of the day   Gabapentin 400 mg three times a day   GERD (REFLUX)  is an extremely common cause of respiratory symptoms just like yours , many times with no obvious heartburn at all.     It can be treated with medication, but also with lifestyle changes including elevation of the head of your bed (ideally with 6 -8inch blocks under the headboard of your bed),  Smoking cessation, avoidance of late meals, excessive alcohol, and avoid fatty foods, chocolate, peppermint, colas, red wine, and acidic juices such as orange juice.  NO MINT OR MENTHOL PRODUCTS SO NO COUGH DROPS  USE SUGARLESS CANDY INSTEAD (Jolley ranchers or Stover's or Life Savers) or even ice chips will also do - the key is to swallow to prevent all throat clearing. NO OIL BASED VITAMINS - use powdered substitutes.  Avoid fish oil when coughing.    No pulmonary follow is needed but Dr Lovena Le may need to consider CPST at your next visit with him

## 2019-01-05 NOTE — Assessment & Plan Note (Signed)
Onset spring/summer 2019  12/15/2018   Walked RA  2 laps @ 229ft each @ nl pace  stopped due to  Sob with sats still 97% at end - 12/15/2018 rec max rx for gerd and f/u pfts    - PFT's  12/31/2018  FEV1 2.50 (90 % ) ratio 79  p 4 % improvement from saba p nothing prior to study with DLCO  63/65c % corrects to 83 % for alv volume  With min curvature -  01/05/2019   Walked RA  2 laps @ approx 280ft each @ fast pace  stopped due to end of study, no desats, no cp/ min sob    No indication of any heart or pulmonary limitations at present and best best is this is conditioning /gerd problem and best to rx x a month (see uacs which he also has) and then consider cpst next

## 2019-01-05 NOTE — Progress Notes (Signed)
Christopher [name], male    DOB: 1946/06/05,   MRN: [id number]    Brief patient profile:  89 yowm quit smoking 1988 with good ex tolerance at that point with new onset doe 08/2018 with neg cards w/u so referred to pulmonary clinic 12/15/2018 by Christopher [name] p stress Myoview showed no ischemia but pt c/o sob and leg pain 12/02/18     History of Present Illness  12/15/2018  Pulmonary/ 1st office eval/Christopher [name]  Chief Complaint  Patient presents with  . Pulmonary Consult    Referred by Christopher Sims, NP.  Pt c/o SOB for the past 3 months, esp worse x 1 month. He gets winded walking up stairs.  He notices CP occ mainly in the evenings.  very active until L knee replacement req 06/2017 then R knee Dec 5102 complicated by afib but despite this did fine on ETT 03/19/18 and gradually worse doe since then s assoc cough / does have occ sscp p eating and is supposed to be on ppi but takes it pc and not watching diet at all. Present doe = MMRC1 = can walk nl pace, flat grade, can't hurry or go uphills or steps s sob rec  Be sure to take protonix Take 30-60 min before first meal of the day    01/05/2019  f/u ov/Christopher [name] re:  Doe and cough  on ppi ac nad gabapentin 400 bid  Chief Complaint  Patient presents with  . Follow-up    still some SOB - occ cough mildly productive  Dyspnea:  Difficult shop to house and up inclines /steps but ok flat and slow but if gets in a hurry gen ant cp  Cough: cough is better  Sleeping: no resp issues  SABA use: none  02: none    No obvious day to day or daytime variability or assoc excess/ purulent sputum or mucus plugs or hemoptysis or   chest tightness, subjective wheeze or overt sinus or hb symptoms.   Sleeping flat  without nocturnal  or early am exacerbation  of respiratory  c/o's or need for noct saba. Also denies any obvious fluctuation of symptoms with weather or environmental changes or other aggravating or alleviating factors except as outlined above   No  unusual exposure hx or h/o childhood pna/ asthma or knowledge of premature birth.  Current Allergies, Complete Past Medical History, Past Surgical History, Family History, and Social History were reviewed in [employer] record.  ROS  The following are not active complaints unless bolded Hoarseness, sore throat, dysphagia, dental problems, itching, sneezing,  nasal congestion or discharge of excess mucus or purulent secretions, ear ache,   fever, chills, sweats, unintended wt loss or wt gain, classically pleuritic or exertional cp,  orthopnea pnd or arm/hand swelling  or leg swelling, presyncope, palpitations, abdominal pain, anorexia, nausea, vomiting, diarrhea  or change in bowel habits or change in bladder habits, change in stools or change in urine, dysuria, hematuria,  rash, arthralgias, visual complaints, headache, numbness, weakness or ataxia or problems with walking or coordination,  change in mood or  memory.        Current Meds  Medication Sig  . alfuzosin (UROXATRAL) 10 MG 24 hr tablet Take 10 mg by mouth daily.  [name] ALPRAZolam (XANAX) 1 MG tablet Take 1 mg by mouth at bedtime.  [name] apixaban (ELIQUIS) 5 MG TABS tablet Take 1 tablet (5 mg total) by mouth 2 (two) times daily.  [name] docusate sodium (COLACE) 100 MG  capsule Take 200 mg by mouth daily.  . finasteride (PROSCAR) 5 MG tablet Take 5 mg by mouth daily.  . furosemide (LASIX) 20 MG tablet Take 1 tablet (20 mg total) by mouth daily.  [name] gabapentin (NEURONTIN) 400 MG capsule Take 400 mg by mouth 2 (two) times daily.   [name] HYDROcodone-acetaminophen (NORCO) 10-325 MG tablet Take 1 tablet by mouth every 6 (six) hours as needed for moderate pain.   . pantoprazole (PROTONIX) 40 MG tablet Take 40 mg by mouth daily.   . pravastatin (PRAVACHOL) 20 MG tablet Take 20 mg by mouth at bedtime.  . propafenone (RYTHMOL) 225 MG tablet Take 1 tablet (225 mg total) by mouth every 8 (eight) hours.                     Objective:      amb somber wm nad   Wt Readings from Last 3 Encounters:  01/05/19 154 lb 12.8 oz (70.2 kg)  12/31/18 159 lb (72.1 kg)  12/15/18 159 lb (72.1 kg)     Vital signs reviewed - Note on arrival 02 sats  97% on RA      HEENT: nl dentition, turbinates bilaterally, and oropharynx. Nl external ear canals without cough reflex   NECK :  without JVD/Nodes/TM/ nl carotid upstrokes bilaterally   LUNGS: no acc muscle use,  Nl contour chest which is clear to A and P bilaterally without cough on insp or exp maneuvers   CV:  RRR  no s3 or murmur or increase in P2, and no edema   ABD:  soft and nontender with nl inspiratory excursion in the supine position. No bruits or organomegaly appreciated, bowel sounds nl  MS:  Nl gait/ ext warm without deformities, calf tenderness, cyanosis or clubbing No obvious joint restrictions   SKIN: warm and dry without lesions    NEURO:  alert, approp, nl sensorium with  no motor or cerebellar deficits apparent.                               Assessment   DOE (dyspnea on exertion) 12/15/2018   Walked RA  2 laps @ 257ft each @ nl pace  stopped due to  Sob with sats still 97% at end - 12/15/2018 rec max rx for gerd and f/u pfts 11/30/18   Symptoms are  disproportionate to objective findings and not clear to what extent this is actually a pulmonary  problem but pt does appear to have difficult to sort out respiratory symptoms of unknown origin for which  DDX  = almost all start with A and  include Adherence, Ace Inhibitors, Acid Reflux, Active Sinus Disease, Alpha 1 Antitripsin deficiency, Anxiety masquerading as Airways dz,  ABPA,  Allergy(esp in young), Aspiration (esp in elderly), Adverse effects of meds,  Active smoking or Vaping, A bunch of PE's/clot burden (a few small clots can't cause this syndrome unless there is already severe underlying pulm or vascular dz with poor reserve),  Anemia or thyroid disorder, plus two Bs  = Bronchiectasis and  Beta blocker use..and one C= CHF    Adherence is always the initial "prime suspect" and is a multilayered concern that requires a "trust but verify" approach in every patient -    ? Acid (or non-acid) GERD > always difficult to exclude as up to 75% of pts in some series report no assoc GI/ Heartburn symptoms and he clearly has  gerd by recent Dg Es> rec max (24h)  acid suppression and diet restrictions/ reviewed and instructions given in writing.   ? Asthma/ copd > there is no variability to suggest asthma at this point and even less likely copd now based on   ? Alpha one AT > no reason to screen if can't prove copd on pfts

## 2019-01-13 DIAGNOSIS — R079 Chest pain, unspecified: Secondary | ICD-10-CM | POA: Diagnosis not present

## 2019-01-13 DIAGNOSIS — R001 Bradycardia, unspecified: Secondary | ICD-10-CM | POA: Diagnosis not present

## 2019-01-13 DIAGNOSIS — I440 Atrioventricular block, first degree: Secondary | ICD-10-CM | POA: Diagnosis not present

## 2019-01-13 DIAGNOSIS — Z87891 Personal history of nicotine dependence: Secondary | ICD-10-CM | POA: Diagnosis not present

## 2019-01-14 ENCOUNTER — Other Ambulatory Visit (HOSPITAL_COMMUNITY): Payer: Self-pay | Admitting: Internal Medicine

## 2019-01-14 DIAGNOSIS — R079 Chest pain, unspecified: Secondary | ICD-10-CM

## 2019-01-14 DIAGNOSIS — R001 Bradycardia, unspecified: Secondary | ICD-10-CM

## 2019-01-14 IMAGING — US US EXTREM LOW VENOUS*L*
1 series · 13 of 24 positions shown · non-contrast
Comparison: None.

CLINICAL DATA: Left lower extremity swelling.



[Series 1: us extrem low venous*left* · 0.07mm/px · 13 of 39 slices shown]
[im 1/39]
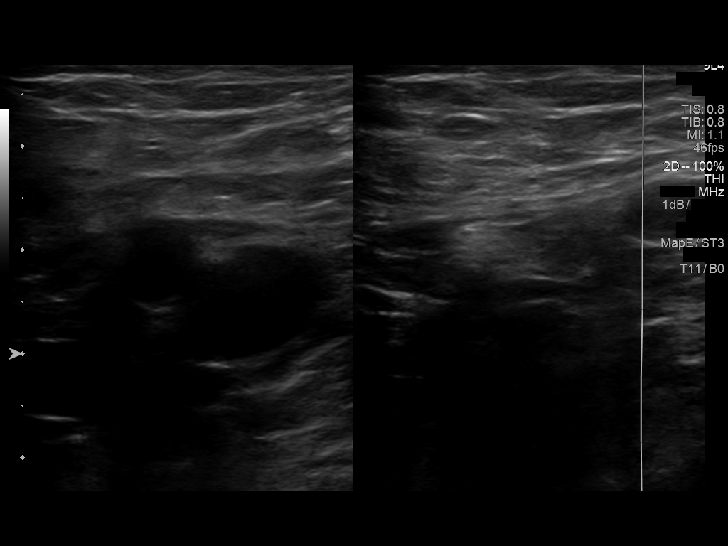
[im 4/39]
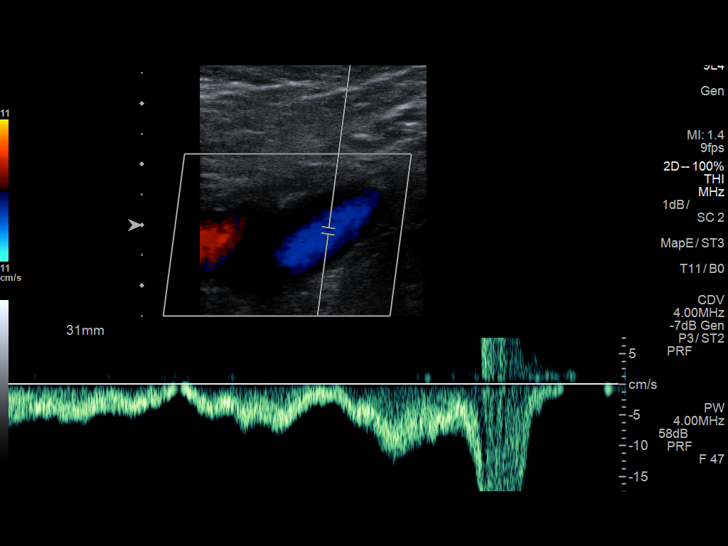
[im 7/39]
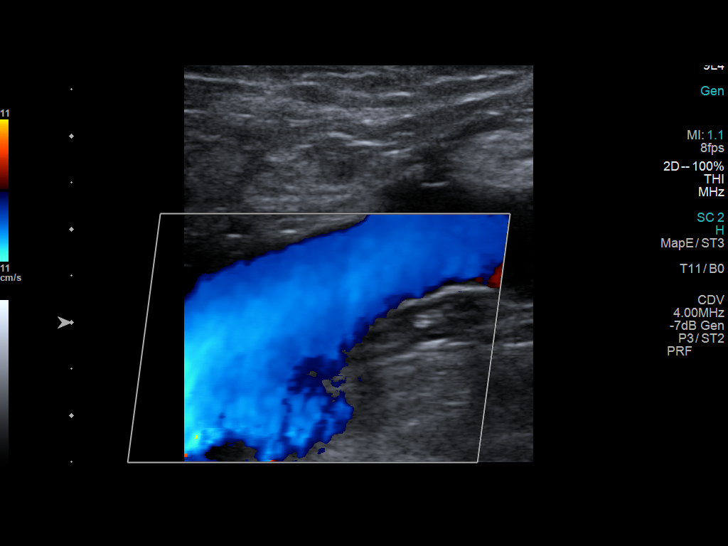
[im 10/39]
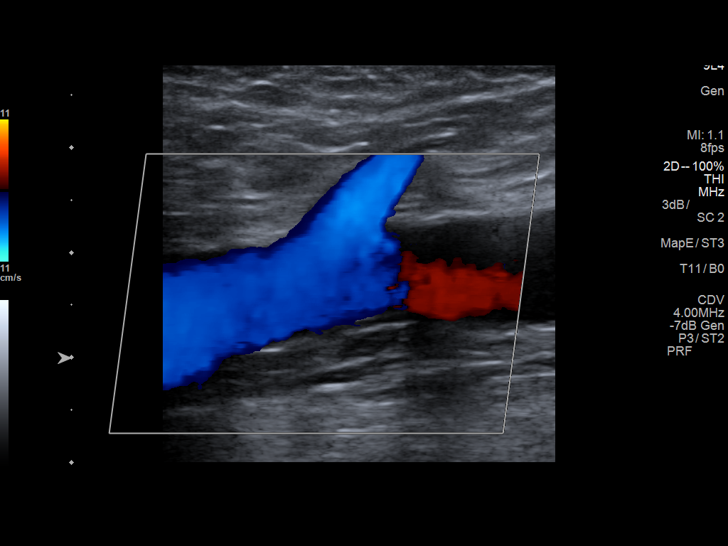
[im 14/39]
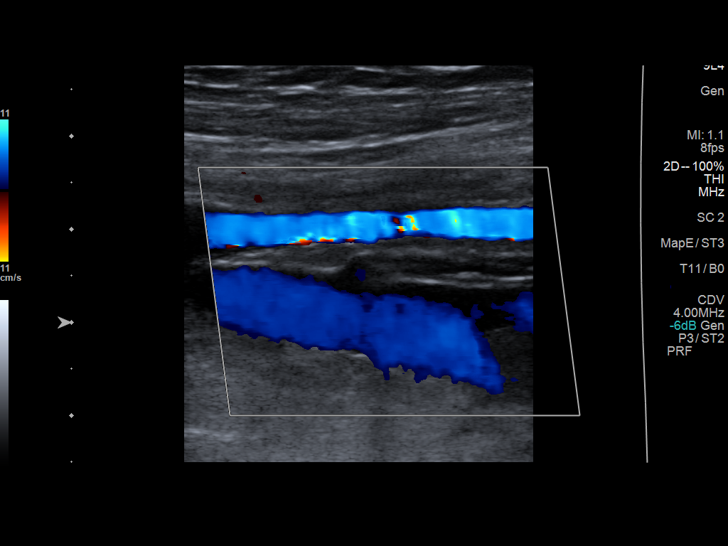
[im 17/39]
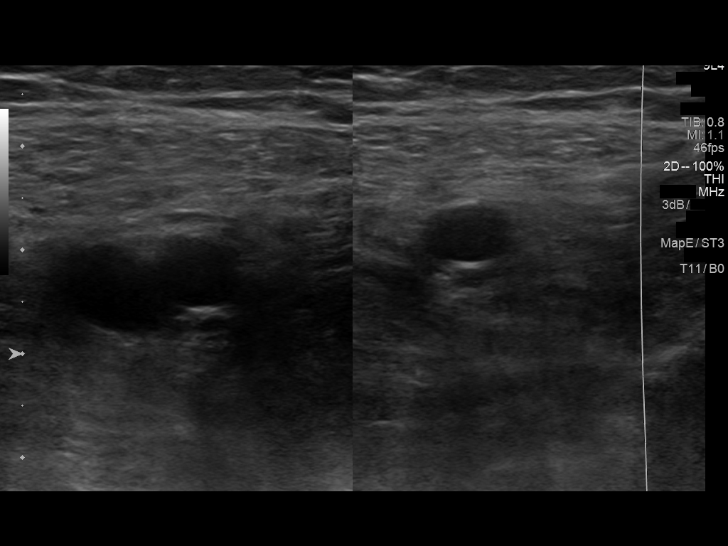
[im 20/39]
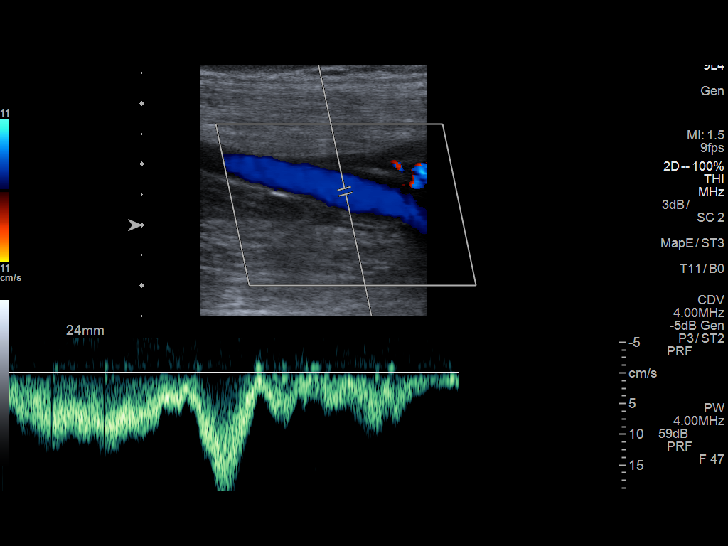
[im 22/39]
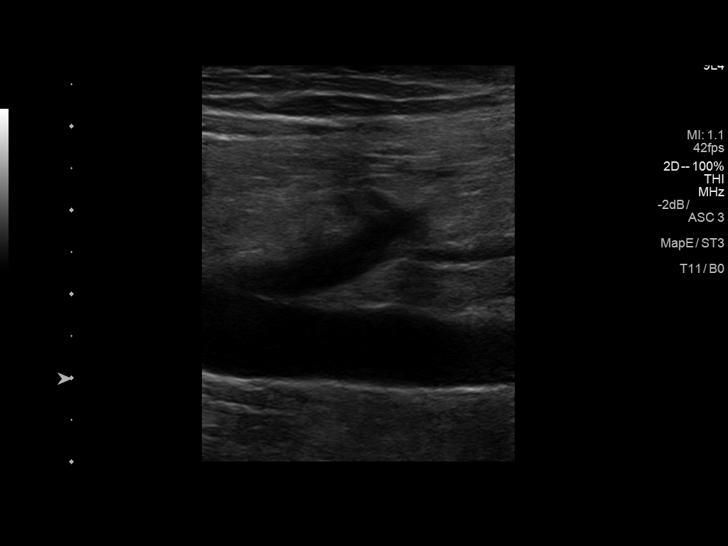
[im 25/39]
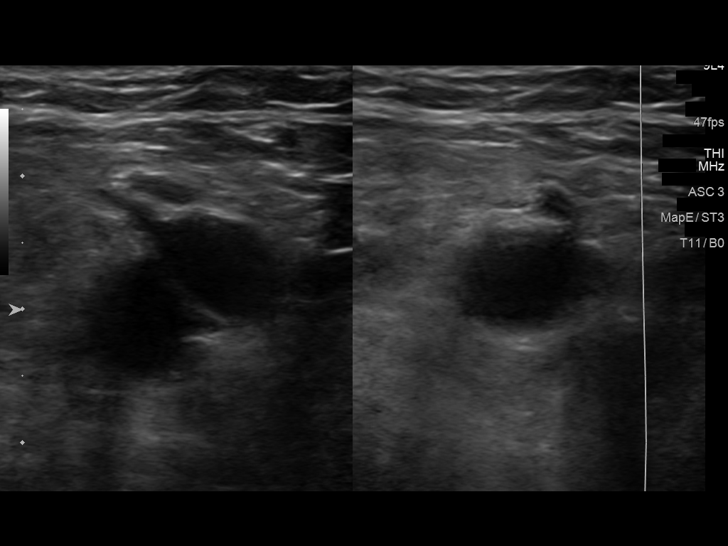
[im 29/39]
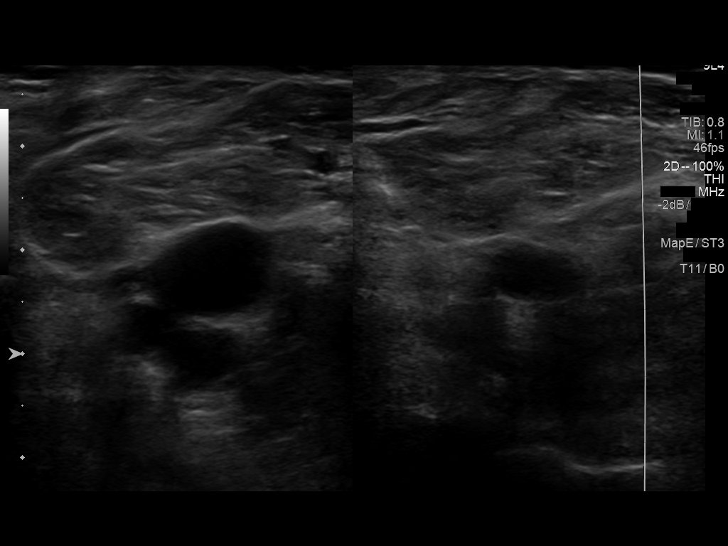
[im 32/39]
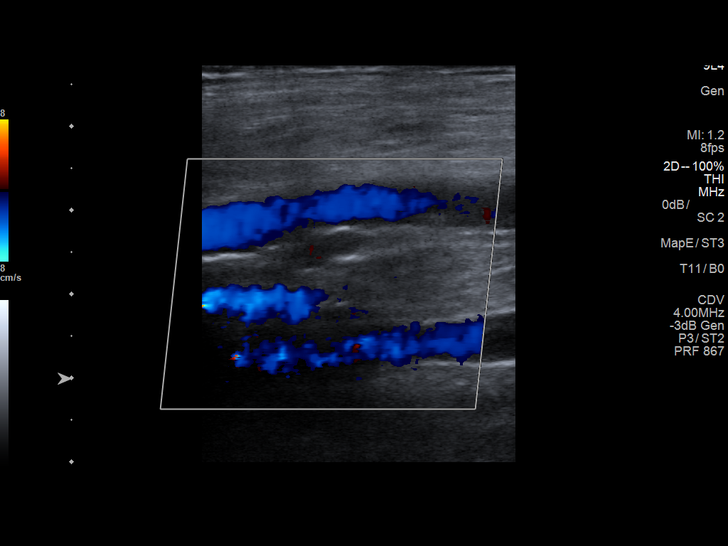
[im 35/39]
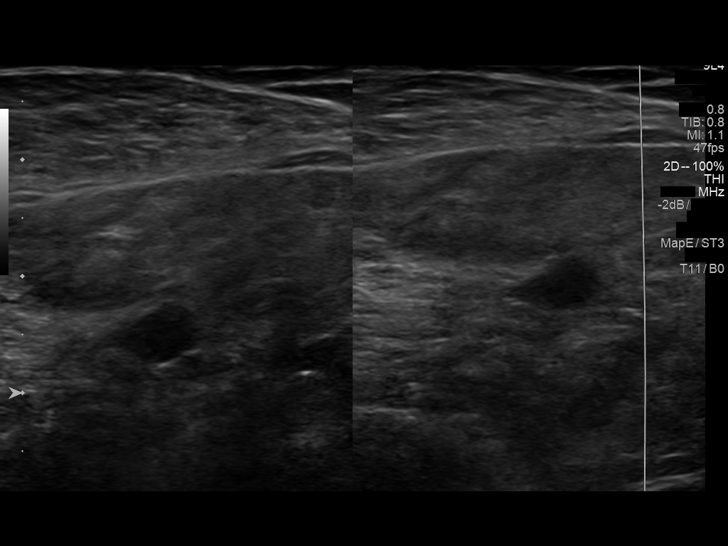
[im 39/39]
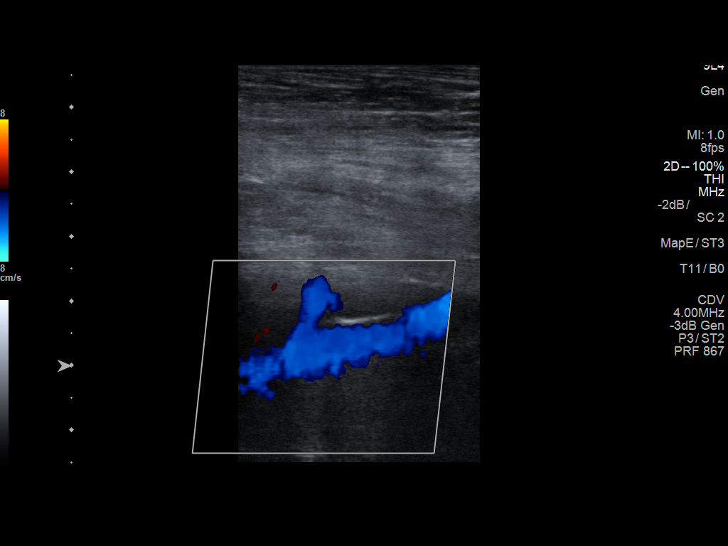

[13 of 24 positions shown; findings below may reference images not displayed]

FINDINGS: Contralateral Common Femoral Vein: Respiratory phasicity is normal
and symmetric with the symptomatic side. No evidence of thrombus.
Normal compressibility.

Common Femoral Vein: No evidence of thrombus. Normal
compressibility, respiratory phasicity and response to augmentation.

Saphenofemoral Junction: No evidence of thrombus. Normal
compressibility and flow on color Doppler imaging.

Profunda Femoral Vein: No evidence of thrombus. Normal
compressibility and flow on color Doppler imaging.

Femoral Vein: No evidence of thrombus. Normal compressibility,
respiratory phasicity and response to augmentation.

Popliteal Vein: No evidence of thrombus. Normal compressibility,
respiratory phasicity and response to augmentation.

Calf Veins: Occlusive thrombus within the peroneal vein. The
posterior and anterior tibial veins demonstrate normal
compressibility and flow on color Doppler imaging.

Superficial Great Saphenous Vein: No evidence of thrombus. Normal
compressibility and flow on color Doppler imaging.

Venous Reflux:  None.

Other Findings:  None.
IMPRESSION: Occlusive thrombus within the left peroneal vein.

These results will be called to the ordering clinician or
representative by the Radiologist Assistant, and communication
documented in the PACS or zVision Dashboard.

## 2019-01-21 ENCOUNTER — Ambulatory Visit (HOSPITAL_COMMUNITY)
Admission: RE | Admit: 2019-01-21 | Discharge: 2019-01-21 | Disposition: A | Discharge status: Home / Self Care | Payer: Medicare Other | Source: Ambulatory Visit | Attending: Internal Medicine | Admitting: Internal Medicine

## 2019-01-21 DIAGNOSIS — R001 Bradycardia, unspecified: Secondary | ICD-10-CM

## 2019-01-21 DIAGNOSIS — R0609 Other forms of dyspnea: Secondary | ICD-10-CM | POA: Diagnosis not present

## 2019-01-21 DIAGNOSIS — R079 Chest pain, unspecified: Secondary | ICD-10-CM | POA: Diagnosis not present

## 2019-01-29 DIAGNOSIS — M19011 Primary osteoarthritis, right shoulder: Secondary | ICD-10-CM | POA: Diagnosis not present

## 2019-01-29 DIAGNOSIS — M25511 Pain in right shoulder: Secondary | ICD-10-CM | POA: Diagnosis not present

## 2019-02-04 ENCOUNTER — Telehealth: Payer: Self-pay | Admitting: Internal Medicine

## 2019-02-04 NOTE — Telephone Encounter (Signed)
Spoke with pt who states that he is still having chest after 3 months. Denies nausea at this time, but does report DOE. Pt states that he has been seen by pulmonary and PCP. Pt had CT chest done and would like Dr. Lovena Le to review.

## 2019-02-04 NOTE — Telephone Encounter (Signed)
Pt states he's still having chest pain, would like someone to please give him a call

## 2019-02-06 NOTE — Telephone Encounter (Signed)
Returned call to Pt.  Per Dr. Lovena Le- after review of chest CT he advises left heart cath for DOE and 3V CAD.  Pt notified of above.  Advised will get Pt in with APP beginning of next week to discuss and set up.  Advised will call Pt Monday morning for appt.  Advised if increased chest pain/sob he should go to the ER.  Pt indicates understanding.

## 2019-02-10 NOTE — Telephone Encounter (Signed)
Pt confirmed appt with SW to discuss LHC.

## 2019-02-11 ENCOUNTER — Ambulatory Visit: Payer: Medicare Other | Admitting: Physician Assistant

## 2019-02-11 ENCOUNTER — Encounter: Payer: Self-pay | Admitting: Physician Assistant

## 2019-02-11 ENCOUNTER — Encounter (INDEPENDENT_AMBULATORY_CARE_PROVIDER_SITE_OTHER): Payer: Self-pay

## 2019-02-11 VITALS — BP 120/58 | HR 56 | Ht 67.0 in | Wt 156.8 lb

## 2019-02-11 DIAGNOSIS — I25119 Atherosclerotic heart disease of native coronary artery with unspecified angina pectoris: Secondary | ICD-10-CM

## 2019-02-11 DIAGNOSIS — I480 Paroxysmal atrial fibrillation: Secondary | ICD-10-CM | POA: Diagnosis not present

## 2019-02-11 DIAGNOSIS — E785 Hyperlipidemia, unspecified: Secondary | ICD-10-CM | POA: Diagnosis not present

## 2019-02-11 MED ORDER — NITROGLYCERIN 0.4 MG SL SUBL: 0.4000 mg | SUBLINGUAL_TABLET | SUBLINGUAL | 5 refills | Status: DC | PRN

## 2019-02-11 MED ORDER — ASPIRIN EC 81 MG PO TBEC: 81.0000 mg | DELAYED_RELEASE_TABLET | Freq: Every day | ORAL | 3 refills | Status: AC

## 2019-02-11 NOTE — Progress Notes (Addendum)
Cardiology Office Note:    Date:  02/11/2019   ID:  OSCEOLA DEPAZ, DOB 09-30-1946, MRN [id number]  PCP:  Celene Squibb, MD  Cardiologist:  Cristopher Peru, MD   Electrophysiologist:  None   Referring MD: Celene Squibb, MD   Chief Complaint  Patient presents with  . Chest Pain    arrange cardiac cath     History of Present Illness:    NOSSON WENDER is a 73 y.o. male with paroxysmal AFib, HTN, HLP, prior DVT after knee replacement in 2018, GERD, OSA.  He was previously controlled on Flecainide by was changed to Propafenone due to GI side effects.   He had a Myoview in 11/2018 that demonstrated small apical MI and mild peri-infarct ischemia.  Echo in 07/2017 demonstrated normal EF.  He was recently seen by Dr. Lovena Le for shortness of breath.  He was initially started on Lasix.  He had a Chest CT done on 01/21/2019 that demonstrated 3 v CAD.  He called in recently with chest pain.  Dr. Lovena Le has recommended proceeding with Cardiac Catheterization to further evaluate.     Mr. Thal returns to arrange Cardiac Catheterization.  He is here alone. He notes symptoms of dyspnea on exertion and chest pain for 3+ months.  He notes shortness of breath with mild to mod activity.  He has fairly constant chest pain but does have worsening chest pain with exertion.  He feels his chest pain over the past week has improved.  He denies syncope.  He does get dizzy with standing sometimes.  He denies orthopnea, paroxysmal nocturnal dyspnea, edema..    Prior CV studies:   The following studies were reviewed today:  Chest CT WO Contrast 01/21/2019 IMPRESSION: 1. Mild paraseptal emphysema. No acute airspace abnormality. No evidence of interstitial lung disease. 2.  Coronary artery disease. 3.  Cholelithiasis.  Myoview 12/02/18  Patient unable to reach target heart rate with exercise, changed to pharmacological stress test  There was no ST segment deviation noted during stress.  This is a low risk study.  The  left ventricular ejection fraction is normal (55-65%).  Findings consistent with prior small apical myocardial infarction with mild peri-infarct ischemia.  ETT 03/19/18  Blood pressure demonstrated a hypertensive response to exercise.  There was no ST segment deviation noted during stress. Negative exercise stress test for ischemia.  Patient is on flecanide. No significant PR or QRS prolongation, no significant arrhythmias with exercise.  Modified bruce protocol used due to leg pains.  Echo 07/23/17 EF 55-60, no RWMA, mild LAE  Aortogram 08/27/2005  1.  Arch aortogram; Type 1 arch without evidence of arch vessel disease.  2.  Right subclavian artery; widely patent.  3.  Abdominal aortogram;      1.  Renal arteries - normal.      2.  Infrarenal abdominal aorta and iliac bifurcation were free of          significant disease.    Past Medical History:  Diagnosis Date  . A-fib ([place])   . Arthritis    "all over my body" (06/17/2017)  . BPH (benign prostatic hyperplasia)    takes Proscar daily  . Chronic back pain    DDD; "upper and lower" (06/17/2017)  . Constipation    takes Colace daily  . DDD (degenerative disc disease), cervical   . DVT (deep venous thrombosis) ([facility])    left peroneal vein 07/2017 (in setting of recent left TKA)  . Dysrhythmia  history of Atrial fibrillation, no long has this  . Enlarged prostate   . GERD (gastroesophageal reflux disease)    takes Nexium daily  . H/O hiatal hernia   . History of colon polyps    benign  . History of gastric ulcer   . Insomnia    takes Xanax nightly  . Joint capsule tear    left knee  . Joint pain   . Joint swelling   . Macular degeneration   . Muscle spasm of back    takes Valium daily as needed  . PAF (paroxysmal atrial fibrillation) ([place])   . Peripheral neuropathy    takes Gabapentin daily  . PONV (postoperative nausea and vomiting)   . Sleep apnea    pt does not wear a cpap  . Urinary hesitancy   . Weakness     numbness and tingling   Surgical Hx: The patient  has a past surgical history that includes Appendectomy; Colonoscopy with esophagogastroduodenoscopy (egd) (N/A, 01/22/2013); Carpal tunnel release (Bilateral, 10/2005 - 11/2005); Cataract extraction w/PHACO (Right, 05/24/2014); Cataract extraction w/PHACO (Left, 06/17/2014); Spinal cord stimulator insertion (N/A, 09/23/2015); Shoulder arthroscopy (Bilateral); Anterior cervical discectomy (<11/2005); Total knee arthroplasty (Left, 06/17/2017); Anterior cervical decomp/discectomy fusion (~ 1991); Anterior cervical decomp/discectomy fusion (X 2); Back surgery; Posterior lumbar fusion; Eye surgery (Bilateral); Cardiac catheterization; Total knee arthroplasty (Left, 06/17/2017); Joint replacement; Quadriceps tendon repair (Left, 07/01/2017); Quadriceps tendon repair (Left, 07/01/2017); Total knee arthroplasty (Right, 12/16/2017); Colonoscopy with propofol (N/A, 05/30/2018); Esophagogastroduodenoscopy (egd) with propofol (N/A, 05/30/2018); and biopsy (05/30/2018).   Current Medications: Current Meds  Medication Sig  . alfuzosin (UROXATRAL) 10 MG 24 hr tablet Take 10 mg by mouth daily.  [name] ALPRAZolam (XANAX) 1 MG tablet Take 1 mg by mouth at bedtime.  [name] apixaban (ELIQUIS) 5 MG TABS tablet Take 1 tablet (5 mg total) by mouth 2 (two) times daily.  [name] docusate sodium (COLACE) 100 MG capsule Take 200 mg by mouth daily.  . finasteride (PROSCAR) 5 MG tablet Take 5 mg by mouth daily.  . furosemide (LASIX) 20 MG tablet Take 1 tablet (20 mg total) by mouth daily.  [name] gabapentin (NEURONTIN) 400 MG capsule One three times a day  . HYDROcodone-acetaminophen (NORCO) 10-325 MG tablet Take 1 tablet by mouth every 6 (six) hours as needed for moderate pain.   . pantoprazole (PROTONIX) 40 MG tablet Take 30- 60 min before your first and last meals of the day  . pravastatin (PRAVACHOL) 20 MG tablet Take 20 mg by mouth at bedtime.  . propafenone (RYTHMOL) 225 MG tablet Take 1 tablet (225 mg  total) by mouth every 8 (eight) hours.     Allergies:   Morphine and related and Propofol   Social History   Tobacco Use  . Smoking status: Former Smoker    Packs/day: 1.00    Years: 30.00    Pack years: 30.00    Types: Cigarettes    Last attempt to quit: 01/14/1987    Years since quitting: 32.0  . Smokeless tobacco: Former Systems developer    Types: Chew  . Tobacco comment: quit smoking  & chewing by 1987  Substance Use Topics  . Alcohol use: Yes    Alcohol/week: 15.0 standard drinks    Types: 3 Glasses of wine, 12 Cans of beer per week    Comment: 2-3 times weekly   . Drug use: No     Family Hx: The patient's family history is not on file.  ROS:   Please see  the history of present illness.    Review of Systems  Cardiovascular: Positive for chest pain and dyspnea on exertion.  Genitourinary: Positive for incomplete emptying.  Neurological: Positive for dizziness.   All other systems reviewed and are negative.   EKGs/Labs/Other Test Reviewed:    EKG:  EKG is   ordered today.  The ekg ordered today demonstrates sinus brady, HR 56, inc RBBB, QTc 424 ms, similar to old ECG  Recent Labs: 12/15/2018: BUN 13; Creatinine, Ser 0.97; Hemoglobin 13.4; Platelets 309.0; Potassium 4.2; Pro B Natriuretic peptide (BNP) 70.0; Sodium 139; TSH 1.93   Recent Lipid Panel Lab Results  Component Value Date/Time   CHOL 193 11/04/2017 11:20 AM   TRIG 72 11/04/2017 11:20 AM   HDL 52 11/04/2017 11:20 AM   CHOLHDL 3.7 11/04/2017 11:20 AM   LDLCALC 127 (H) 11/04/2017 11:20 AM    Physical Exam:    VS:  BP (!) 120/58   Pulse (!) 56   Ht 5\' 7"  (1.702 m)   Wt 156 lb 12.8 oz (71.1 kg)   SpO2 98%   BMI 24.56 kg/m     Wt Readings from Last 3 Encounters:  02/11/19 156 lb 12.8 oz (71.1 kg)  01/05/19 154 lb 12.8 oz (70.2 kg)  12/31/18 159 lb (72.1 kg)     Physical Exam  Constitutional: He is oriented to person, place, and time. He appears well-developed and well-nourished. No distress.  HENT:    Head: Normocephalic and atraumatic.  Eyes: No scleral icterus.  Neck: Neck supple. No JVD present. No thyromegaly present.  Cardiovascular: Normal rate, regular rhythm, S1 normal and S2 normal.  No murmur heard. Pulmonary/Chest: Breath sounds normal. He has no rales.  Abdominal: Soft. There is no hepatomegaly.  Musculoskeletal:        General: No edema.  Lymphadenopathy:    He has no cervical adenopathy.  Neurological: He is alert and oriented to person, place, and time.  Skin: Skin is warm and dry.  Psychiatric: He has a normal mood and affect.    ASSESSMENT & PLAN:    Coronary artery disease involving native coronary artery of native heart with angina pectoris ([place]) Recent chest CT demonstrates 3 v CAD.  He has had issues with chest pain and shortness of breath for 3 or more months.  The recommendation from Dr. Lovena Le is to proceed with Cardiac Catheterization.  Risks and benefits of cardiac catheterization have been discussed with the patient.  These include bleeding, infection, kidney damage, stroke, heart attack, death.  The patient understands these risks and is willing to proceed..  Start ASA 81 mg QD.  I will give him a Rx for prn NTG.  His procedure will be arranged in the next 1 week.  Labs will be obtained today.  FU with Dr. Lovena Le will be arranged 2 weeks post cath.  PAF (paroxysmal atrial fibrillation) (HCC) Maintaining normal sinus rhythm on Propafenone.  He will hold his Eliquis for 2 days prior to his Cardiac Catheterization.  If he has significant CAD, he will likely need to be changed from Propafenone to Amiodarone or Dofetilide.  This will be per Dr. Lovena Le.    Hyperlipidemia, unspecified hyperlipidemia type   Continue statin.   Dispo:  Return for Post Procedure Follow Up with Dr. Lovena Le.   Medication Adjustments/Labs and Tests Ordered: Current medicines are reviewed at length with the patient today.  Concerns regarding medicines are outlined above.  Tests  Ordered: Orders Placed This Encounter  Procedures  .  Basic metabolic panel  . CBC  . EKG 12-Lead   Medication Changes: Meds ordered this encounter  Medications  . nitroGLYCERIN (NITROSTAT) 0.4 MG SL tablet    Sig: Place 1 tablet (0.4 mg total) under the tongue every 5 (five) minutes as needed for chest pain.    Dispense:  25 tablet    Refill:  5  . aspirin EC 81 MG tablet    Sig: Take 1 tablet (81 mg total) by mouth daily.    Dispense:  90 tablet    Refill:  3    Signed, Richardson Dopp, PA-C  02/11/2019 4:38 PM    [facility] [address], [place], [place]  [address] Phone: [phone]; Fax: [phone]   Cardiology Attending  Agree with above. He has a worrisome CT scan and worsening sob and will undergo left heart cath. Additional rec's will be based on the results of the heart cath.  Mikle Bosworth.D.

## 2019-02-11 NOTE — H&P (View-Only) (Signed)
Cardiology Office Note:    Date:  02/11/2019   ID:  Christopher [name], DOB 25-Dec-1945, MRN [id number]  PCP:  Celene Squibb, MD  Cardiologist:  Cristopher Peru, MD   Electrophysiologist:  None   Referring MD: Celene Squibb, MD   Chief Complaint  Patient presents with  . Chest Pain    arrange cardiac cath     History of Present Illness:    Christopher [name] is a 73 y.o. male with paroxysmal AFib, HTN, HLP, prior DVT after knee replacement in 2018, GERD, OSA.  He was previously controlled on Flecainide by was changed to Propafenone due to GI side effects.   He had a Myoview in 11/2018 that demonstrated small apical MI and mild peri-infarct ischemia.  Echo in 07/2017 demonstrated normal EF.  He was recently seen by Dr. Lovena Le for shortness of breath.  He was initially started on Lasix.  He had a Chest CT done on 01/21/2019 that demonstrated 3 v CAD.  He called in recently with chest pain.  Dr. Lovena Le has recommended proceeding with Cardiac Catheterization to further evaluate.     [name] returns to arrange Cardiac Catheterization.  He is here alone. He notes symptoms of dyspnea on exertion and chest pain for 3+ months.  He notes shortness of breath with mild to mod activity.  He has fairly constant chest pain but does have worsening chest pain with exertion.  He feels his chest pain over the past week has improved.  He denies syncope.  He does get dizzy with standing sometimes.  He denies orthopnea, paroxysmal nocturnal dyspnea, edema..    Prior CV studies:   The following studies were reviewed today:  Chest CT WO Contrast 01/21/2019 IMPRESSION: 1. Mild paraseptal emphysema. No acute airspace abnormality. No evidence of interstitial lung disease. 2.  Coronary artery disease. 3.  Cholelithiasis.  Myoview 12/02/18  Patient unable to reach target heart rate with exercise, changed to pharmacological stress test  There was no ST segment deviation noted during stress.  This is a low risk study.  The  left ventricular ejection fraction is normal (55-65%).  Findings consistent with prior small apical myocardial infarction with mild peri-infarct ischemia.  ETT 03/19/18  Blood pressure demonstrated a hypertensive response to exercise.  There was no ST segment deviation noted during stress. Negative exercise stress test for ischemia.  Patient is on flecanide. No significant PR or QRS prolongation, no significant arrhythmias with exercise.  Modified bruce protocol used due to leg pains.  Echo 07/23/17 EF 55-60, no RWMA, mild LAE  Aortogram 08/27/2005  1.  Arch aortogram; Type 1 arch without evidence of arch vessel disease.  2.  Right subclavian artery; widely patent.  3.  Abdominal aortogram;      1.  Renal arteries - normal.      2.  Infrarenal abdominal aorta and iliac bifurcation were free of          significant disease.    Past Medical History:  Diagnosis Date  . A-fib ([place])   . Arthritis    "all over my body" (06/17/2017)  . BPH (benign prostatic hyperplasia)    takes Proscar daily  . Chronic back pain    DDD; "upper and lower" (06/17/2017)  . Constipation    takes Colace daily  . DDD (degenerative disc disease), cervical   . DVT (deep venous thrombosis) ([facility])    left peroneal vein 07/2017 (in setting of recent left TKA)  . Dysrhythmia  history of Atrial fibrillation, no long has this  . Enlarged prostate   . GERD (gastroesophageal reflux disease)    takes Nexium daily  . H/O hiatal hernia   . History of colon polyps    benign  . History of gastric ulcer   . Insomnia    takes Xanax nightly  . Joint capsule tear    left knee  . Joint pain   . Joint swelling   . Macular degeneration   . Muscle spasm of back    takes Valium daily as needed  . PAF (paroxysmal atrial fibrillation) ([place])   . Peripheral neuropathy    takes Gabapentin daily  . PONV (postoperative nausea and vomiting)   . Sleep apnea    pt does not wear a cpap  . Urinary hesitancy   . Weakness     numbness and tingling   Surgical Hx: The patient  has a past surgical history that includes Appendectomy; Colonoscopy with esophagogastroduodenoscopy (egd) (N/A, 01/22/2013); Carpal tunnel release (Bilateral, 10/2005 - 11/2005); Cataract extraction w/PHACO (Right, 05/24/2014); Cataract extraction w/PHACO (Left, 06/17/2014); Spinal cord stimulator insertion (N/A, 09/23/2015); Shoulder arthroscopy (Bilateral); Anterior cervical discectomy (<11/2005); Total knee arthroplasty (Left, 06/17/2017); Anterior cervical decomp/discectomy fusion (~ 1991); Anterior cervical decomp/discectomy fusion (X 2); Back surgery; Posterior lumbar fusion; Eye surgery (Bilateral); Cardiac catheterization; Total knee arthroplasty (Left, 06/17/2017); Joint replacement; Quadriceps tendon repair (Left, 07/01/2017); Quadriceps tendon repair (Left, 07/01/2017); Total knee arthroplasty (Right, 12/16/2017); Colonoscopy with propofol (N/A, 05/30/2018); Esophagogastroduodenoscopy (egd) with propofol (N/A, 05/30/2018); and biopsy (05/30/2018).   Current Medications: Current Meds  Medication Sig  . alfuzosin (UROXATRAL) 10 MG 24 hr tablet Take 10 mg by mouth daily.  [name] ALPRAZolam (XANAX) 1 MG tablet Take 1 mg by mouth at bedtime.  [name] apixaban (ELIQUIS) 5 MG TABS tablet Take 1 tablet (5 mg total) by mouth 2 (two) times daily.  [name] docusate sodium (COLACE) 100 MG capsule Take 200 mg by mouth daily.  . finasteride (PROSCAR) 5 MG tablet Take 5 mg by mouth daily.  . furosemide (LASIX) 20 MG tablet Take 1 tablet (20 mg total) by mouth daily.  [name] gabapentin (NEURONTIN) 400 MG capsule One three times a day  . HYDROcodone-acetaminophen (NORCO) 10-325 MG tablet Take 1 tablet by mouth every 6 (six) hours as needed for moderate pain.   . pantoprazole (PROTONIX) 40 MG tablet Take 30- 60 min before your first and last meals of the day  . pravastatin (PRAVACHOL) 20 MG tablet Take 20 mg by mouth at bedtime.  . propafenone (RYTHMOL) 225 MG tablet Take 1 tablet (225 mg  total) by mouth every 8 (eight) hours.     Allergies:   Morphine and related and Propofol   Social History   Tobacco Use  . Smoking status: Former Smoker    Packs/day: 1.00    Years: 30.00    Pack years: 30.00    Types: Cigarettes    Last attempt to quit: 01/14/1987    Years since quitting: 32.0  . Smokeless tobacco: Former Systems developer    Types: Chew  . Tobacco comment: quit smoking  & chewing by 1987  Substance Use Topics  . Alcohol use: Yes    Alcohol/week: 15.0 standard drinks    Types: 3 Glasses of wine, 12 Cans of beer per week    Comment: 2-3 times weekly   . Drug use: No     Family Hx: The patient's family history is not on file.  ROS:   Please see  the history of present illness.    Review of Systems  Cardiovascular: Positive for chest pain and dyspnea on exertion.  Genitourinary: Positive for incomplete emptying.  Neurological: Positive for dizziness.   All other systems reviewed and are negative.   EKGs/Labs/Other Test Reviewed:    EKG:  EKG is   ordered today.  The ekg ordered today demonstrates sinus brady, HR 56, inc RBBB, QTc 424 ms, similar to old ECG  Recent Labs: 12/15/2018: BUN 13; Creatinine, Ser 0.97; Hemoglobin 13.4; Platelets 309.0; Potassium 4.2; Pro B Natriuretic peptide (BNP) 70.0; Sodium 139; TSH 1.93   Recent Lipid Panel Lab Results  Component Value Date/Time   CHOL 193 11/04/2017 11:20 AM   TRIG 72 11/04/2017 11:20 AM   HDL 52 11/04/2017 11:20 AM   CHOLHDL 3.7 11/04/2017 11:20 AM   LDLCALC 127 (H) 11/04/2017 11:20 AM    Physical Exam:    VS:  BP (!) 120/58   Pulse (!) 56   Ht 5\' 7"  (1.702 m)   Wt 156 lb 12.8 oz (71.1 kg)   SpO2 98%   BMI 24.56 kg/m     Wt Readings from Last 3 Encounters:  02/11/19 156 lb 12.8 oz (71.1 kg)  01/05/19 154 lb 12.8 oz (70.2 kg)  12/31/18 159 lb (72.1 kg)     Physical Exam  Constitutional: He is oriented to person, place, and time. He appears well-developed and well-nourished. No distress.  HENT:    Head: Normocephalic and atraumatic.  Eyes: No scleral icterus.  Neck: Neck supple. No JVD present. No thyromegaly present.  Cardiovascular: Normal rate, regular rhythm, S1 normal and S2 normal.  No murmur heard. Pulmonary/Chest: Breath sounds normal. He has no rales.  Abdominal: Soft. There is no hepatomegaly.  Musculoskeletal:        General: No edema.  Lymphadenopathy:    He has no cervical adenopathy.  Neurological: He is alert and oriented to person, place, and time.  Skin: Skin is warm and dry.  Psychiatric: He has a normal mood and affect.    ASSESSMENT & PLAN:    Coronary artery disease involving native coronary artery of native heart with angina pectoris ([place]) Recent chest CT demonstrates 3 v CAD.  He has had issues with chest pain and shortness of breath for 3 or more months.  The recommendation from Dr. Lovena Le is to proceed with Cardiac Catheterization.  Risks and benefits of cardiac catheterization have been discussed with the patient.  These include bleeding, infection, kidney damage, stroke, heart attack, death.  The patient understands these risks and is willing to proceed..  Start ASA 81 mg QD.  I will give him a Rx for prn NTG.  His procedure will be arranged in the next 1 week.  Labs will be obtained today.  FU with Dr. Lovena Le will be arranged 2 weeks post cath.  PAF (paroxysmal atrial fibrillation) (HCC) Maintaining normal sinus rhythm on Propafenone.  He will hold his Eliquis for 2 days prior to his Cardiac Catheterization.  If he has significant CAD, he will likely need to be changed from Propafenone to Amiodarone or Dofetilide.  This will be per Dr. Lovena Le.    Hyperlipidemia, unspecified hyperlipidemia type   Continue statin.   Dispo:  Return for Post Procedure Follow Up with Dr. Lovena Le.   Medication Adjustments/Labs and Tests Ordered: Current medicines are reviewed at length with the patient today.  Concerns regarding medicines are outlined above.  Tests  Ordered: Orders Placed This Encounter  Procedures  .  Basic metabolic panel  . CBC  . EKG 12-Lead   Medication Changes: Meds ordered this encounter  Medications  . nitroGLYCERIN (NITROSTAT) 0.4 MG SL tablet    Sig: Place 1 tablet (0.4 mg total) under the tongue every 5 (five) minutes as needed for chest pain.    Dispense:  25 tablet    Refill:  5  . aspirin EC 81 MG tablet    Sig: Take 1 tablet (81 mg total) by mouth daily.    Dispense:  90 tablet    Refill:  3    Signed, Richardson Dopp, PA-C  02/11/2019 4:38 PM    [facility] [place], [place], [place]  [address] Phone: [phone]; Fax: [phone]   Cardiology Attending  Agree with above. He has a worrisome CT scan and worsening sob and will undergo left heart cath. Additional rec's will be based on the results of the heart cath.  Mikle Bosworth.D.

## 2019-02-11 NOTE — Patient Instructions (Addendum)
Medication Instructions:  Your physician has recommended you make the following change in your medication: 1. START ASPRIN 81 MG DAILY  2. START NITROGLYCERIN AS NEEDED FOR CHEST PAIN  3. HOLD ELIQUIS FOR 2 DAYS (STARTING Saturday, February 29TH) BEFORE CATH.   If you need a refill on your cardiac medications before your next appointment, please call your pharmacy.   Lab work: TODAY: BMET, CBC  If you have labs (blood work) drawn today and your tests are completely normal, you will receive your results only by: [name] MyChart Message (if you have MyChart) OR . A paper copy in the mail If you have any lab test that is abnormal or we need to change your treatment, we will call you to review the results.  Testing/Procedures: Your physician has requested that you have a cardiac catheterization. Cardiac catheterization is used to diagnose and/or treat various heart conditions. Doctors may recommend this procedure for a number of different reasons. The most common reason is to evaluate chest pain. Chest pain can be a symptom of coronary artery disease (CAD), and cardiac catheterization can show whether plaque is narrowing or blocking your heart's arteries. This procedure is also used to evaluate the valves, as well as measure the blood flow and oxygen levels in different parts of your heart. For further information please visit [email or web address]. Please follow instruction sheet, as given.    Follow-Up: . Your physician recommends that you schedule a follow-up appointment in: [place].   Any Other Special Instructions Will Be Listed Below (If Applicable).     [facility] [place], [address] [place] [address] Dept: [phone] Loc: [place]  02/11/2019  You are scheduled for a Cardiac Catheterization on Monday, March 2 with Dr. Daneen Schick.  1. Please arrive  at the [facility] (Main Entrance A) at [facility]: [address] [place], [place] [address] at 5:30 AM (This time is two hours before your procedure to ensure your preparation). Free valet parking service is available.   Special note: Every effort is made to have your procedure done on time. Please understand that emergencies sometimes delay scheduled procedures.  2. Diet: Do not eat solid foods after midnight.  The patient may have clear liquids until 5am upon the day of the procedure.  3. Labs: You will need to have blood drawn TODAY (2/26). You do not need to be fasting.  4. Medication instructions in preparation for your procedure:   Contrast Allergy: No  Stop taking Eliquis (Apixiban) on Saturday, February 29. TWO DAYS BEFORE PRODURE  Stop taking, Lasix (Furosemide)  Monday, March 2, THE MORNING OF PROCEDURE.  On the morning of your procedure, take your ASPIRIN 81 MG and any morning medicines NOT listed above.  You may use sips of water.  5. Plan for one night stay--bring personal belongings. 6. Bring a current list of your medications and current insurance cards. 7. You MUST have a responsible person to drive you home. 8. Someone MUST be with you the first 24 hours after you arrive home or your discharge will be delayed. 9. Please wear clothes that are easy to get on and off and wear slip-on shoes.  Thank you for allowing [place] to care for you!   -- [facility]

## 2019-02-12 ENCOUNTER — Telehealth: Payer: Self-pay | Admitting: *Deleted

## 2019-02-12 LAB — BASIC METABOLIC PANEL
BUN/Creatinine Ratio: 12 (ref 10–24)
BUN: 10 mg/dL (ref 8–27)
CO2: 26 mmol/L (ref 20–29)
Calcium: 9.6 mg/dL (ref 8.6–10.2)
Chloride: 101 mmol/L (ref 96–106)
Creatinine, Ser: 0.82 mg/dL (ref 0.76–1.27)
GFR calc Af Amer: 101 mL/min/{1.73_m2} (ref >59)
GFR calc non Af Amer: 87 mL/min/{1.73_m2} (ref >59)
Glucose: 94 mg/dL (ref 65–99)
Potassium: 4.1 mmol/L (ref 3.5–5.2)
Sodium: 145 mmol/L — ABNORMAL HIGH (ref 134–144)

## 2019-02-12 LAB — CBC
Hematocrit: 38.5 % (ref 37.5–51.0)
Hemoglobin: 13.2 g/dL (ref 13.0–17.7)
MCH: 30.4 pg (ref 26.6–33.0)
MCHC: 34.3 g/dL (ref 31.5–35.7)
MCV: 89 fL (ref 79–97)
Platelets: 343 10*3/uL (ref 150–450)
RBC: 4.34 x10E6/uL (ref 4.14–5.80)
RDW: 11.5 % — ABNORMAL LOW (ref 11.6–15.4)
WBC: 5.6 10*3/uL (ref 3.4–10.8)

## 2019-02-12 NOTE — Telephone Encounter (Signed)
Pt contacted pre-catheterization scheduled at [facility] for: Monday February 16, 2019 7:30 AM Verified arrival time and place: [place] Entrance A at: 5:30 AM  No solid food after midnight prior to cath, clear liquids until 5 AM day of procedure.  Hold: Eliquis-hold  starting 02/14/19 until post procedure.  Furosemide-AM of procedure  Except hold medications AM meds can be  taken pre-cath with sip of water including: ASA 81 mg  Confirmed patient has responsible person to drive home post procedure and observe 24 hours after arriving home: yes

## 2019-02-16 ENCOUNTER — Encounter (HOSPITAL_COMMUNITY): Payer: Self-pay | Admitting: Interventional Cardiology

## 2019-02-16 ENCOUNTER — Other Ambulatory Visit: Payer: Self-pay

## 2019-02-16 ENCOUNTER — Ambulatory Visit (HOSPITAL_COMMUNITY)
Admission: RE | Admit: 2019-02-16 | Discharge: 2019-02-16 | Disposition: A | Discharge status: Home / Self Care | Payer: Medicare Other | Attending: Interventional Cardiology | Admitting: Interventional Cardiology

## 2019-02-16 ENCOUNTER — Encounter (HOSPITAL_COMMUNITY)
Admission: RE | Disposition: A | Discharge status: Home / Self Care | Payer: Self-pay | Source: Home / Self Care | Attending: Interventional Cardiology

## 2019-02-16 DIAGNOSIS — R9439 Abnormal result of other cardiovascular function study: Secondary | ICD-10-CM | POA: Insufficient documentation

## 2019-02-16 DIAGNOSIS — I25118 Atherosclerotic heart disease of native coronary artery with other forms of angina pectoris: Secondary | ICD-10-CM | POA: Diagnosis not present

## 2019-02-16 DIAGNOSIS — E785 Hyperlipidemia, unspecified: Secondary | ICD-10-CM | POA: Insufficient documentation

## 2019-02-16 DIAGNOSIS — Z885 Allergy status to narcotic agent status: Secondary | ICD-10-CM | POA: Insufficient documentation

## 2019-02-16 DIAGNOSIS — Z7901 Long term (current) use of anticoagulants: Secondary | ICD-10-CM | POA: Insufficient documentation

## 2019-02-16 DIAGNOSIS — G4700 Insomnia, unspecified: Secondary | ICD-10-CM | POA: Diagnosis not present

## 2019-02-16 DIAGNOSIS — K219 Gastro-esophageal reflux disease without esophagitis: Secondary | ICD-10-CM | POA: Diagnosis not present

## 2019-02-16 DIAGNOSIS — Z79899 Other long term (current) drug therapy: Secondary | ICD-10-CM | POA: Insufficient documentation

## 2019-02-16 DIAGNOSIS — I252 Old myocardial infarction: Secondary | ICD-10-CM | POA: Diagnosis not present

## 2019-02-16 DIAGNOSIS — Z86718 Personal history of other venous thrombosis and embolism: Secondary | ICD-10-CM | POA: Insufficient documentation

## 2019-02-16 DIAGNOSIS — Z87891 Personal history of nicotine dependence: Secondary | ICD-10-CM | POA: Diagnosis not present

## 2019-02-16 DIAGNOSIS — R0609 Other forms of dyspnea: Secondary | ICD-10-CM | POA: Insufficient documentation

## 2019-02-16 DIAGNOSIS — R0600 Dyspnea, unspecified: Secondary | ICD-10-CM | POA: Diagnosis present

## 2019-02-16 DIAGNOSIS — N401 Enlarged prostate with lower urinary tract symptoms: Secondary | ICD-10-CM | POA: Diagnosis not present

## 2019-02-16 DIAGNOSIS — G629 Polyneuropathy, unspecified: Secondary | ICD-10-CM | POA: Diagnosis not present

## 2019-02-16 DIAGNOSIS — M1990 Unspecified osteoarthritis, unspecified site: Secondary | ICD-10-CM | POA: Insufficient documentation

## 2019-02-16 DIAGNOSIS — I480 Paroxysmal atrial fibrillation: Secondary | ICD-10-CM | POA: Diagnosis not present

## 2019-02-16 DIAGNOSIS — R3911 Hesitancy of micturition: Secondary | ICD-10-CM | POA: Insufficient documentation

## 2019-02-16 DIAGNOSIS — I25119 Atherosclerotic heart disease of native coronary artery with unspecified angina pectoris: Secondary | ICD-10-CM

## 2019-02-16 DIAGNOSIS — I2584 Coronary atherosclerosis due to calcified coronary lesion: Secondary | ICD-10-CM | POA: Diagnosis not present

## 2019-02-16 DIAGNOSIS — I4891 Unspecified atrial fibrillation: Secondary | ICD-10-CM | POA: Diagnosis present

## 2019-02-16 DIAGNOSIS — I10 Essential (primary) hypertension: Secondary | ICD-10-CM | POA: Diagnosis not present

## 2019-02-16 DIAGNOSIS — G4733 Obstructive sleep apnea (adult) (pediatric): Secondary | ICD-10-CM | POA: Diagnosis not present

## 2019-02-16 HISTORY — PX: LEFT HEART CATH AND CORONARY ANGIOGRAPHY: CATH118249

## 2019-02-16 SURGERY — LEFT HEART CATH AND CORONARY ANGIOGRAPHY
Anesthesia: LOCAL

## 2019-02-16 MED ORDER — HEPARIN (PORCINE) IN NACL 1000-0.9 UT/500ML-% IV SOLN
INTRAVENOUS | Status: DC | PRN
  Administered 2019-02-16: 500 mL

## 2019-02-16 MED ORDER — SODIUM CHLORIDE 0.9 % WEIGHT BASED INFUSION
3.0000 mL/kg/h | INTRAVENOUS | Status: AC
  Administered 2019-02-16: 3 mL/kg/h via INTRAVENOUS

## 2019-02-16 MED ORDER — SODIUM CHLORIDE 0.9% FLUSH: 3.0000 mL | Freq: Two times a day (BID) | INTRAVENOUS | Status: DC

## 2019-02-16 MED ORDER — MIDAZOLAM HCL 2 MG/2ML IJ SOLN
INTRAMUSCULAR | Status: AC
  Filled 2019-02-16: qty 2

## 2019-02-16 MED ORDER — SODIUM CHLORIDE 0.9 % IV SOLN: 250.0000 mL | INTRAVENOUS | Status: DC | PRN

## 2019-02-16 MED ORDER — LIDOCAINE HCL (PF) 1 % IJ SOLN
INTRAMUSCULAR | Status: AC
  Filled 2019-02-16: qty 30

## 2019-02-16 MED ORDER — VERAPAMIL HCL 2.5 MG/ML IV SOLN
INTRAVENOUS | Status: AC
  Filled 2019-02-16: qty 2

## 2019-02-16 MED ORDER — MIDAZOLAM HCL 2 MG/2ML IJ SOLN
INTRAMUSCULAR | Status: DC | PRN
  Administered 2019-02-16 (×2): 0.5 mg via INTRAVENOUS

## 2019-02-16 MED ORDER — SODIUM CHLORIDE 0.9% FLUSH: 3.0000 mL | INTRAVENOUS | Status: DC | PRN

## 2019-02-16 MED ORDER — HEPARIN SODIUM (PORCINE) 1000 UNIT/ML IJ SOLN
INTRAMUSCULAR | Status: AC
  Filled 2019-02-16: qty 1

## 2019-02-16 MED ORDER — ATORVASTATIN CALCIUM 80 MG PO TABS: 80.0000 mg | ORAL_TABLET | Freq: Every day | ORAL | 11 refills | Status: DC

## 2019-02-16 MED ORDER — VERAPAMIL HCL 2.5 MG/ML IV SOLN
INTRAVENOUS | Status: DC | PRN
  Administered 2019-02-16: 10 mL via INTRA_ARTERIAL

## 2019-02-16 MED ORDER — ONDANSETRON HCL 4 MG/2ML IJ SOLN: 4.0000 mg | Freq: Four times a day (QID) | INTRAMUSCULAR | Status: DC | PRN

## 2019-02-16 MED ORDER — HEPARIN (PORCINE) IN NACL 1000-0.9 UT/500ML-% IV SOLN
INTRAVENOUS | Status: AC
  Filled 2019-02-16: qty 1000

## 2019-02-16 MED ORDER — ATORVASTATIN CALCIUM 80 MG PO TABS: 80.0000 mg | ORAL_TABLET | Freq: Every day | ORAL | Status: DC

## 2019-02-16 MED ORDER — SODIUM CHLORIDE 0.9 % IV SOLN: INTRAVENOUS | Status: DC

## 2019-02-16 MED ORDER — FENTANYL CITRATE (PF) 100 MCG/2ML IJ SOLN
INTRAMUSCULAR | Status: DC | PRN
  Administered 2019-02-16 (×2): 25 ug via INTRAVENOUS

## 2019-02-16 MED ORDER — FENTANYL CITRATE (PF) 100 MCG/2ML IJ SOLN
INTRAMUSCULAR | Status: AC
  Filled 2019-02-16: qty 2

## 2019-02-16 MED ORDER — ISOSORBIDE MONONITRATE ER 30 MG PO TB24: 30.0000 mg | ORAL_TABLET | Freq: Every day | ORAL | 11 refills | Status: DC

## 2019-02-16 MED ORDER — ACETAMINOPHEN 325 MG PO TABS: 650.0000 mg | ORAL_TABLET | ORAL | Status: DC | PRN

## 2019-02-16 MED ORDER — IOHEXOL 350 MG/ML SOLN
INTRAVENOUS | Status: DC | PRN
  Administered 2019-02-16: 100 mL via INTRAVENOUS

## 2019-02-16 MED ORDER — SODIUM CHLORIDE 0.9 % WEIGHT BASED INFUSION: 1.0000 mL/kg/h | INTRAVENOUS | Status: DC

## 2019-02-16 MED ORDER — ASPIRIN 81 MG PO CHEW: 81.0000 mg | CHEWABLE_TABLET | ORAL | Status: AC

## 2019-02-16 MED ORDER — LIDOCAINE HCL (PF) 1 % IJ SOLN
INTRAMUSCULAR | Status: DC | PRN
  Administered 2019-02-16: 2 mL

## 2019-02-16 MED ORDER — HEPARIN SODIUM (PORCINE) 1000 UNIT/ML IJ SOLN
INTRAMUSCULAR | Status: DC | PRN
  Administered 2019-02-16: 4000 [IU] via INTRAVENOUS

## 2019-02-16 SURGICAL SUPPLY — 11 items
CATH 5FR JL3.5 JR4 ANG PIG MP (CATHETERS) ×2 IMPLANT
DEVICE RAD COMP TR BAND LRG (VASCULAR PRODUCTS) ×2 IMPLANT
GLIDESHEATH SLEND A-KIT 6F 22G (SHEATH) ×2 IMPLANT
GUIDEWIRE INQWIRE 1.5J.035X260 (WIRE) ×1 IMPLANT
INQWIRE 1.5J .035X260CM (WIRE) ×2
KIT HEART LEFT (KITS) ×2 IMPLANT
PACK CARDIAC CATHETERIZATION (CUSTOM PROCEDURE TRAY) ×2 IMPLANT
SHEATH PROBE COVER 6X72 (BAG) ×2 IMPLANT
TRANSDUCER W/STOPCOCK (MISCELLANEOUS) ×2 IMPLANT
TUBING CIL FLEX 10 FLL-RA (TUBING) ×2 IMPLANT
WIRE HI TORQ VERSACORE-J 145CM (WIRE) ×2 IMPLANT

## 2019-02-16 NOTE — Interval H&P Note (Signed)
Cath Lab Visit (complete for each Cath Lab visit)  Clinical Evaluation Leading to the Procedure:   ACS: No.  Non-ACS:    Anginal Classification: CCS [name]  Anti-ischemic medical therapy: Minimal Therapy (1 class of medications)  Non-Invasive Test Results: Intermediate-risk stress test findings: cardiac mortality 1-3%/year  Prior CABG: No previous CABG      History and Physical Interval Note:  02/16/2019 7:35 AM  Christopher [name]  has presented today for surgery, with the diagnosis of cad  The various methods of treatment have been discussed with the patient and family. After consideration of risks, benefits and other options for treatment, the patient has consented to  Procedure(s): LEFT HEART CATH AND CORONARY ANGIOGRAPHY (N/A) as a surgical intervention .  The patient's history has been reviewed, patient examined, no change in status, stable for surgery.  I have reviewed the patient's chart and labs.  Questions were answered to the patient's satisfaction.     Christopher [name]

## 2019-02-16 NOTE — CV Procedure (Signed)
   Coronary angiography and left heart cath performed via right radial using ultrasound guidance for access.  Proximal eccentric 60 to 70% LAD mid 80% LAD all within a heavily calcified and tortuous segment.  Second diagonal contains diffuse 80% stenosis.  Circumflex gives origin to 3 obtuse marginal branches.  The first and second obtuse marginal each contain 60% stenosis.  Left main is widely patent  Right coronary contains mid 50% eccentric narrowing.  Left ventricle contractility is normal with EF 60%.  LVEDP is normal.  Recommend aggressive risk factor modification including high intensity statin therapy.  Add long-acting nitrate, Imdur.  Chest pain symptoms are atypical and that he can have 30 to 40 minutes of severe relatively local chest pain that then resolves to a milder intensity discomfort that can go on 5 hours.  Episodes occur with activity and can also occur at rest.  Perhaps some of these complaints are ischemic.  Other components may be related to known lumbar and thoracic spine disease with referred pain.

## 2019-02-16 NOTE — Discharge Instructions (Signed)
Fat and Cholesterol Restricted Eating Plan Eating a diet that limits fat and cholesterol may help lower your risk for heart disease and other conditions. Your body needs fat and cholesterol for basic functions, but eating too much of these things can be harmful to your health. Your health care provider may order lab tests to check your blood fat (lipid) and cholesterol levels. This helps your health care provider understand your risk for certain conditions and whether you need to make diet changes. Work with your health care provider or dietitian to make an eating plan that is right for you. Your plan includes:  Limit your fat intake to ______% or less of your total calories a day.  Limit your saturated fat intake to ______% or less of your total calories a day.  Limit the amount of cholesterol in your diet to less than _________mg a day.  Eat ___________ g of fiber a day. What are tips for following this plan? General guidelines   If you are overweight, work with your health care provider to lose weight safely. Losing just 5-10% of your body weight can improve your overall health and help prevent diseases such as diabetes and heart disease.  Avoid: ? Foods with added sugar. ? Fried foods. ? Foods that contain partially hydrogenated oils, including stick margarine, some tub margarines, cookies, crackers, and other baked goods.  Limit alcohol intake to no more than 1 drink a day for nonpregnant women and 2 drinks a day for men. One drink equals 12 oz of beer, 5 oz of wine, or 1 oz of hard liquor. Reading food labels  Check food labels for: ? Trans fats, partially hydrogenated oils, or high amounts of saturated fat. Avoid foods that contain saturated fat and trans fat. ? The amount of cholesterol in each serving. Try to eat no more than 200 mg of cholesterol each day. ? The amount of fiber in each serving. Try to eat at least 20-30 g of fiber each day.  Choose foods with healthy fats,  such as: ? Monounsaturated and polyunsaturated fats. These include olive and canola oil, flaxseeds, walnuts, almonds, and seeds. ? Omega-3 fats. These are found in foods such as salmon, mackerel, sardines, tuna, flaxseed oil, and ground flaxseeds.  Choose grain products that have whole grains. Look for the word "whole" as the first word in the ingredient list. Cooking  Cook foods using methods other than frying. Baking, boiling, grilling, and broiling are some healthy options.  Eat more home-cooked food and less restaurant, buffet, and fast food.  Avoid cooking using saturated fats. ? Animal sources of saturated fats include meats, butter, and cream. ? Plant sources of saturated fats include palm oil, palm kernel oil, and coconut oil. Meal planning   At meals, imagine dividing your plate into fourths: ? Fill one-half of your plate with vegetables and green salads. ? Fill one-fourth of your plate with whole grains. ? Fill one-fourth of your plate with lean protein foods.  Eat fish that is high in omega-3 fats at least two times a week.  Eat more foods that contain fiber, such as whole grains, beans, apples, broccoli, carrots, peas, and barley. These foods help promote healthy cholesterol levels in the blood. Recommended foods Grains  Whole grains, such as whole wheat or whole grain breads, crackers, cereals, and pasta. Unsweetened oatmeal, bulgur, barley, quinoa, or brown rice. Corn or whole wheat flour tortillas. Vegetables  Fresh or frozen vegetables (raw, steamed, roasted, or grilled). Green salads. Fruits    All fresh, canned (in natural juice), or frozen fruits. Meats and other protein foods  Ground beef (85% or leaner), grass-fed beef, or beef trimmed of fat. Skinless chicken or [place]. Ground chicken or [place]. Pork trimmed of fat. All fish and seafood. Egg whites. Dried beans, peas, or lentils. Unsalted nuts or seeds. Unsalted canned beans. Natural nut butters without added  sugar and oil. Dairy  Low-fat or nonfat dairy products, such as skim or 1% milk, 2% or reduced-fat cheeses, low-fat and fat-free ricotta or cottage cheese, or plain low-fat and nonfat yogurt. Fats and oils  Tub margarine without trans fats. Light or reduced-fat mayonnaise and salad dressings. Avocado. Olive, canola, sesame, or safflower oils. The items listed above may not be a complete list of recommended foods or beverages. Contact your dietitian for more options. Foods to avoid Grains  White bread. White pasta. White rice. Cornbread. Bagels, pastries, and croissants. Crackers and snack foods that contain trans fat and hydrogenated oils. Vegetables  Vegetables cooked in cheese, cream, or butter sauce. Fried vegetables. Fruits  Canned fruit in heavy syrup. Fruit in cream or butter sauce. Fried fruit. Meats and other protein foods  Fatty cuts of meat. Ribs, chicken wings, bacon, sausage, bologna, salami, chitterlings, fatback, hot dogs, bratwurst, and packaged lunch meats. Liver and organ meats. Whole eggs and egg yolks. Chicken and [place] with skin. Fried meat. Dairy  Whole or 2% milk, cream, half-and-half, and cream cheese. Whole milk cheeses. Whole-fat or sweetened yogurt. Full-fat cheeses. Nondairy creamers and whipped toppings. Processed cheese, cheese spreads, and cheese curds. Beverages  Alcohol. Sugar-sweetened drinks such as sodas, lemonade, and fruit drinks. Fats and oils  Butter, stick margarine, lard, shortening, ghee, or bacon fat. Coconut, palm kernel, and palm oils. Sweets and desserts  Corn syrup, sugars, honey, and molasses. Candy. Jam and jelly. Syrup. Sweetened cereals. Cookies, pies, cakes, donuts, muffins, and ice cream. The items listed above may not be a complete list of foods and beverages to avoid. Contact your dietitian for more information. Summary  Your body needs fat and cholesterol for basic functions. However, eating too much of these things can be  harmful to your health.  Work with your health care provider and dietitian to follow a diet low in fat and cholesterol. Doing this may help lower your risk for heart disease and other conditions.  Choose healthy fats, such as monounsaturated and polyunsaturated fats, and foods high in omega-3 fatty acids.  Eat fiber-rich foods, such as whole grains, beans, peas, fruits, and vegetables.  Limit or avoid alcohol, fried foods, and foods high in saturated fats, partially hydrogenated oils, and sugar. This information is not intended to replace advice given to you by your health care provider. Make sure you discuss any questions you have with your health care provider. Document Released: 12/03/2005 Document Revised: 08/20/2017 Document Reviewed: 08/20/2017 Elsevier Interactive Patient Education  2019 [place]  This sheet gives you information about how to care for yourself after your procedure. Your health care provider may also give you more specific instructions. If you have problems or questions, contact your health care provider. What can I expect after the procedure? After the procedure, it is common to have:  Bruising and tenderness at the catheter insertion area. Follow these instructions at home: Medicines  Take over-the-counter and prescription medicines only as told by your health care provider. Insertion site care  Follow instructions from your health care provider about how to take care of your insertion site.  Make sure you: ? Wash your hands with soap and water before you change your bandage (dressing). If soap and water are not available, use hand sanitizer. ? Change your dressing as told by your health care provider. ? Leave stitches (sutures), skin glue, or adhesive strips in place. These skin closures may need to stay in place for 2 weeks or longer. If adhesive strip edges start to loosen and curl up, you may trim the loose edges. Do not remove adhesive  strips completely unless your health care provider tells you to do that.  Check your insertion site every day for signs of infection. Check for: ? Redness, swelling, or pain. ? Fluid or blood. ? Pus or a bad smell. ? Warmth.  Do not take baths, swim, or use a hot tub until your health care provider approves.  You may shower 24-48 hours after the procedure, or as directed by your health care provider. ? Remove the dressing and gently wash the site with plain soap and water. ? Pat the area dry with a clean towel. ? Do not rub the site. That could cause bleeding.  Do not apply powder or lotion to the site. Activity   For 24 hours after the procedure, or as directed by your health care provider: ? Do not flex or bend the affected arm. ? Do not push or pull heavy objects with the affected arm. ? Do not drive yourself home from the hospital or clinic. You may drive 24 hours after the procedure unless your health care provider tells you not to. ? Do not operate machinery or power tools.  Do not lift anything that is heavier than 10 lb (4.5 kg), or the limit that you are told, until your health care provider says that it is safe.  Ask your health care provider when it is okay to: ? Return to work or school. ? Resume usual physical activities or sports. ? Resume sexual activity. General instructions  If the catheter site starts to bleed, raise your arm and put firm pressure on the site. If the bleeding does not stop, get help right away. This is a medical emergency.  If you went home on the same day as your procedure, a responsible adult should be with you for the first 24 hours after you arrive home.  Keep all follow-up visits as told by your health care provider. This is important. Contact a health care provider if:  You have a fever.  You have redness, swelling, or yellow drainage around your insertion site. Get help right away if:  You have unusual pain at the radial  site.  The catheter insertion area swells very fast.  The insertion area is bleeding, and the bleeding does not stop when you hold steady pressure on the area.  Your arm or hand becomes pale, cool, tingly, or numb. These symptoms may represent a serious problem that is an emergency. Do not wait to see if the symptoms will go away. Get medical help right away. Call your local emergency services (911 in the [place] Do not drive yourself to the hospital. Summary  After the procedure, it is common to have bruising and tenderness at the site.  Follow instructions from your health care provider about how to take care of your radial site wound. Check the wound every day for signs of infection.  Do not lift anything that is heavier than 10 lb (4.5 kg), or the limit that you are told, until your health care  provider says that it is safe. This information is not intended to replace advice given to you by your health care provider. Make sure you discuss any questions you have with your health care provider. Document Released: 01/05/2011 Document Revised: 01/08/2018 Document Reviewed: 01/08/2018 Elsevier Interactive Patient Education  2019 [employer].

## 2019-02-27 ENCOUNTER — Ambulatory Visit: Payer: Medicare Other | Admitting: Internal Medicine

## 2019-02-27 ENCOUNTER — Other Ambulatory Visit: Payer: Self-pay

## 2019-02-27 ENCOUNTER — Encounter: Payer: Self-pay | Admitting: Internal Medicine

## 2019-02-27 VITALS — BP 116/70 | HR 60 | Ht 68.0 in | Wt 155.0 lb

## 2019-02-27 DIAGNOSIS — I25119 Atherosclerotic heart disease of native coronary artery with unspecified angina pectoris: Secondary | ICD-10-CM

## 2019-02-27 DIAGNOSIS — I480 Paroxysmal atrial fibrillation: Secondary | ICD-10-CM

## 2019-02-27 MED ORDER — AMIODARONE HCL 200 MG PO TABS: 200.0000 mg | ORAL_TABLET | Freq: Every day | ORAL | 3 refills | Status: DC

## 2019-02-27 NOTE — Patient Instructions (Addendum)
Medication Instructions:  Your physician has recommended you make the following change in your medication:   1.  Stop taking propafenone (Rhythmol)  2.  Start taking amiodarone 200 mg--Take ONE tablet by mouth ONCE a day.  Labwork: None ordered.  Testing/Procedures:  You will need a 12 LEAD EKG in [place] in 3 weeks (NURSE VISIT).  Follow-Up: Your physician wants you to follow-up in:  3 months with Dr. Lovena Le in [place].         Any Other Special Instructions Will Be Listed Below (If Applicable).  If you need a refill on your cardiac medications before your next appointment, please call your pharmacy.

## 2019-02-27 NOTE — Progress Notes (Signed)
HPI Christopher [name] returns today for follow up of sob. He is a pleasant 73 yo man with PAF who developed progressive dyspnea on exertion. He underwent stress testing which was initially thought to be low risk with a small anterior/apical area of scar and ischemia on a lexiscan myoview stress test. His workup for unexplained sob demonstrated extensive 3 vessel CAD on chest CT. He has mild evidence of COPD by CT and PFT testing. He underewent left heart cath due to his extensive 3 vessel disease seen on CT scanning and sob and was found to have severe 3 vessel CAD with extensive disease in the LAD essentially involving the entire vessel with lesions up to 90%. Because of the diffuse and severe nature of his CAD, he was thought not to be a candidate for simple stenting. High risk directional atherectomy was considered. It was hoped that the patient's symptoms could be controlled with medical therapy. He was placed on imdur and now has a HA. He still has sob with exertion. He had the impression that the high dose statin therapy would "dissolve" his CAD.  Allergies  Allergen Reactions  . Morphine And Related Nausea And Vomiting  . Propofol Nausea And Vomiting     Current Outpatient Medications  Medication Sig Dispense Refill  . ALPRAZolam (XANAX) 1 MG tablet Take 1 mg by mouth at bedtime.  0  . apixaban (ELIQUIS) 5 MG TABS tablet Take 1 tablet (5 mg total) by mouth 2 (two) times daily.    [name] aspirin EC 81 MG tablet Take 1 tablet (81 mg total) by mouth daily. 90 tablet 3  . atorvastatin (LIPITOR) 80 MG tablet Take 1 tablet (80 mg total) by mouth daily. 30 tablet 11  . Calcium Polycarbophil (FIBER-CAPS PO) Take 2 capsules by mouth daily.    . Cholecalciferol (VITAMIN D3) 10 MCG (400 UNIT) CAPS Take 400 Units by mouth daily.    [name] docusate sodium (COLACE) 250 MG capsule Take 250 mg by mouth daily.    . finasteride (PROSCAR) 5 MG tablet Take 5 mg by mouth daily.  3  . furosemide (LASIX) 20 MG tablet Take  1 tablet (20 mg total) by mouth daily. 90 tablet 3  . gabapentin (NEURONTIN) 400 MG capsule One three times a day (Patient taking differently: Take 400 mg by mouth 3 (three) times daily. One three times a da)    . HYDROcodone-acetaminophen (NORCO) 10-325 MG tablet Take 1 tablet by mouth every 8 (eight) hours as needed for moderate pain.     . isosorbide mononitrate (IMDUR) 30 MG 24 hr tablet Take 1 tablet (30 mg total) by mouth daily. 30 tablet 11  . nitroGLYCERIN (NITROSTAT) 0.4 MG SL tablet Place 1 tablet (0.4 mg total) under the tongue every 5 (five) minutes as needed for chest pain. 25 tablet 5  . pantoprazole (PROTONIX) 40 MG tablet Take 30- 60 min before your first and last meals of the day (Patient taking differently: Take 40 mg by mouth 2 (two) times daily. Take 30- 60 min before your first and last meals of the day) 60 tablet 2  . tamsulosin (FLOMAX) 0.4 MG CAPS capsule Take 0.4 mg by mouth at bedtime.    . vitamin E 200 UNIT capsule Take 200 Units by mouth daily.    [name] amiodarone (PACERONE) 200 MG tablet Take 1 tablet (200 mg total) by mouth daily. 90 tablet 3   No current facility-administered medications for this visit.  Past Medical History:  Diagnosis Date  . A-fib ([place])   . Arthritis    "all over my body" (06/17/2017)  . BPH (benign prostatic hyperplasia)    takes Proscar daily  . Chronic back pain    DDD; "upper and lower" (06/17/2017)  . Constipation    takes Colace daily  . DDD (degenerative disc disease), cervical   . DVT (deep venous thrombosis) ([facility])    left peroneal vein 07/2017 (in setting of recent left TKA)  . Dysrhythmia    history of Atrial fibrillation, no long has this  . Enlarged prostate   . GERD (gastroesophageal reflux disease)    takes Nexium daily  . H/O hiatal hernia   . History of colon polyps    benign  . History of gastric ulcer   . Insomnia    takes Xanax nightly  . Joint capsule tear    left knee  . Joint pain   . Joint swelling   .  Macular degeneration   . Muscle spasm of back    takes Valium daily as needed  . PAF (paroxysmal atrial fibrillation) ([place])   . Peripheral neuropathy    takes Gabapentin daily  . PONV (postoperative nausea and vomiting)   . Sleep apnea    pt does not wear a cpap  . Urinary hesitancy   . Weakness    numbness and tingling    ROS:   All systems reviewed and negative except as noted in the HPI.   Past Surgical History:  Procedure Laterality Date  . ANTERIOR CERVICAL DECOMP/DISCECTOMY FUSION  ~ 1991   "took bone from hip"  . ANTERIOR CERVICAL DECOMP/DISCECTOMY FUSION  X 2   "put plate in"  . ANTERIOR CERVICAL DISCECTOMY  <11/2005    5-6 and 4-5./notes 05/02/2011  . APPENDECTOMY    . BACK SURGERY    . BIOPSY  05/30/2018   Procedure: BIOPSY;  Surgeon: [name], MD;  [facility];  Service: Endoscopy;;  esophagus  . CARDIAC CATHETERIZATION    . CARPAL TUNNEL RELEASE Bilateral 10/2005 - 11/2005   right-left [facility] Endo 05/02/2011  . CATARACT EXTRACTION W/PHACO Right 05/24/2014   Procedure: CATARACT EXTRACTION PHACO AND INTRAOCULAR LENS PLACEMENT (IOC);  Surgeon: [name], MD;  Location: [facility];  Service: Ophthalmology;  Laterality: Right;  CDE 6.74  . CATARACT EXTRACTION W/PHACO Left 06/17/2014   Procedure: CATARACT EXTRACTION PHACO AND INTRAOCULAR LENS PLACEMENT (IOC);  Surgeon: [name], MD;  Location: [facility];  Service: Ophthalmology;  Laterality: Left;  CDE:4.65  . COLONOSCOPY WITH ESOPHAGOGASTRODUODENOSCOPY (EGD) N/A 01/22/2013   Procedure: COLONOSCOPY WITH ESOPHAGOGASTRODUODENOSCOPY (EGD);  Surgeon: [name], MD;  [facility];  Service: Endoscopy;  Laterality: N/A;  140  . COLONOSCOPY WITH PROPOFOL N/A 05/30/2018   Procedure: COLONOSCOPY WITH PROPOFOL;  Surgeon: [name], MD;  [facility];  Service: Endoscopy;  Laterality: N/A;  . ESOPHAGOGASTRODUODENOSCOPY (EGD) WITH PROPOFOL N/A 05/30/2018   Procedure: ESOPHAGOGASTRODUODENOSCOPY  (EGD) WITH PROPOFOL;  Surgeon: [name], MD;  [facility];  Service: Endoscopy;  Laterality: N/A;  . EYE SURGERY Bilateral    "cleaned film w/laser; since my 1st cataract OR"  . JOINT REPLACEMENT    . LEFT HEART CATH AND CORONARY ANGIOGRAPHY N/A 02/16/2019   Procedure: LEFT HEART CATH AND CORONARY ANGIOGRAPHY;  Surgeon: Belva Crome, MD;  Location: [facility];  Service: Cardiovascular;  Laterality: N/A;  . POSTERIOR LUMBAR FUSION     "  put a plate in"  . QUADRICEPS TENDON REPAIR Left 07/01/2017  . QUADRICEPS TENDON REPAIR Left 07/01/2017   Procedure: REPAIR QUADRICEP TENDON;  Surgeon: [name], MD;  Location: [facility];  Service: Orthopedics;  Laterality: Left;  . SHOULDER ARTHROSCOPY Bilateral   . SPINAL CORD STIMULATOR INSERTION N/A 09/23/2015   Procedure: LUMBAR SPINAL CORD STIMULATOR INSERTION;  Surgeon: [name], MD;  Location: [facility];  Service: Neurosurgery;  Laterality: N/A;  LUMBAR SPINAL CORD STIMULATOR INSERTION  . TOTAL KNEE ARTHROPLASTY Left 06/17/2017  . TOTAL KNEE ARTHROPLASTY Left 06/17/2017   Procedure: TOTAL KNEE ARTHROPLASTY;  Surgeon: [name], MD;  Location: [facility];  Service: Orthopedics;  Laterality: Left;  . TOTAL KNEE ARTHROPLASTY Right 12/16/2017   Procedure: TOTAL KNEE ARTHROPLASTY;  Surgeon: [name], MD;  Location: [facility];  Service: Orthopedics;  Laterality: Right;     History reviewed. No pertinent family history.   Social History   Socioeconomic History  . Marital status: Significant Other    Spouse name: Not on file  . Number of children: Not on file  . Years of education: Not on file  . Highest education level: Not on file  Occupational History  . Not on file  Social Needs  . Financial resource strain: Not on file  . Food insecurity:    Worry: Not on file    Inability: Not on file  . Transportation needs:    Medical: Not on file    Non-medical: Not on file  Tobacco Use  . Smoking status: Former Smoker     Packs/day: 1.00    Years: 30.00    Pack years: 30.00    Types: Cigarettes    Last attempt to quit: 01/14/1987    Years since quitting: 32.1  . Smokeless tobacco: Former Systems developer    Types: Chew  . Tobacco comment: quit smoking  & chewing by 1987  Substance and Sexual Activity  . Alcohol use: Yes    Alcohol/week: 15.0 standard drinks    Types: 3 Glasses of wine, 12 Cans of beer per week    Comment: 2-3 times weekly   . Drug use: No  . Sexual activity: Not Currently  Lifestyle  . Physical activity:    Days per week: Not on file    Minutes per session: Not on file  . Stress: Not on file  Relationships  . Social connections:    Talks on phone: Not on file    Gets together: Not on file    Attends religious service: Not on file    Active member of club or organization: Not on file    Attends meetings of clubs or organizations: Not on file    Relationship status: Not on file  . Intimate partner violence:    Fear of current or ex partner: Not on file    Emotionally abused: Not on file    Physically abused: Not on file    Forced sexual activity: Not on file  Other Topics Concern  . Not on file  Social History Narrative  . Not on file     BP 116/70   Pulse 60   Ht 5\' 8"  (1.727 m)   Wt 155 lb (70.3 kg)   SpO2 98%   BMI 23.57 kg/m   Physical Exam:  Well appearing NAD HEENT: Unremarkable Neck:  No JVD, no thyromegally Lymphatics:  No adenopathy Back:  No CVA tenderness Lungs:  Clear with no wheezes HEART:  Regular rate rhythm, no murmurs,  no rubs, no clicks Abd:  soft, positive bowel sounds, no organomegally, no rebound, no guarding Ext:  2 plus pulses, no edema, no cyanosis, no clubbing Skin:  No rashes no nodules Neuro:  CN II through XII intact, motor grossly intact  Assess/Plan: 1. 3 vessel CAD - he has preserved LV function. We will attempt medical therapy for now. High risk directional atherectomy an option we hope we can avoid. He has sob and no angina.  2. COPD  - he stopped smoking 30 years ago. His PFT's are not too bad. Mild/mod defect. 3. Atrial fib - as he has CAD, he will need to stop propafenone. Best treatment with his problems would be to start low dose amiodarone.  I spent over 40 minutes including reviewing chart with this patient. 50% face to face time. 4. HA - we discussed the treatment with stopping the imdur vs taking tylenol.  5. Dyslipidemia - I discussed the rationale for high dose lipitor. He will continue.  Christopher [name],M.D.  Christopher [name].D.

## 2019-03-03 DIAGNOSIS — D519 Vitamin B12 deficiency anemia, unspecified: Secondary | ICD-10-CM | POA: Diagnosis not present

## 2019-03-03 DIAGNOSIS — I4891 Unspecified atrial fibrillation: Secondary | ICD-10-CM | POA: Diagnosis not present

## 2019-03-03 DIAGNOSIS — E559 Vitamin D deficiency, unspecified: Secondary | ICD-10-CM | POA: Diagnosis not present

## 2019-03-03 DIAGNOSIS — K219 Gastro-esophageal reflux disease without esophagitis: Secondary | ICD-10-CM | POA: Diagnosis not present

## 2019-03-03 DIAGNOSIS — E782 Mixed hyperlipidemia: Secondary | ICD-10-CM | POA: Diagnosis not present

## 2019-03-05 ENCOUNTER — Telehealth: Payer: Self-pay | Admitting: Internal Medicine

## 2019-03-05 DIAGNOSIS — I10 Essential (primary) hypertension: Secondary | ICD-10-CM | POA: Diagnosis not present

## 2019-03-05 DIAGNOSIS — Z9689 Presence of other specified functional implants: Secondary | ICD-10-CM | POA: Diagnosis not present

## 2019-03-05 DIAGNOSIS — M5416 Radiculopathy, lumbar region: Secondary | ICD-10-CM | POA: Diagnosis not present

## 2019-03-05 DIAGNOSIS — M961 Postlaminectomy syndrome, not elsewhere classified: Secondary | ICD-10-CM | POA: Diagnosis not present

## 2019-03-05 DIAGNOSIS — M542 Cervicalgia: Secondary | ICD-10-CM | POA: Diagnosis not present

## 2019-03-05 MED ORDER — PANTOPRAZOLE SODIUM 40 MG PO TBEC: DELAYED_RELEASE_TABLET | ORAL | 2 refills | Status: DC

## 2019-03-05 NOTE — Telephone Encounter (Signed)
Spoke with pt and verified he wanted a refill of his pantoprazole.  Also called Amy back at Dr Juel Burrow office and verified the request.  Script [facility].  Nothing further needed.

## 2019-03-05 NOTE — Telephone Encounter (Signed)
Amy at Dr. Juel Burrow office called stating pt is changing his pharmacy to [facility].

## 2019-03-06 NOTE — Telephone Encounter (Signed)
Changed to [facility] in pt's record

## 2019-03-19 ENCOUNTER — Telehealth: Payer: Self-pay

## 2019-03-19 NOTE — Telephone Encounter (Signed)
LM to r/s nurse visit for 4/3 due to COVID 19

## 2019-03-20 ENCOUNTER — Ambulatory Visit: Payer: Medicare Other

## 2019-03-30 ENCOUNTER — Other Ambulatory Visit: Payer: Self-pay | Admitting: Internal Medicine

## 2019-04-06 DIAGNOSIS — M19011 Primary osteoarthritis, right shoulder: Secondary | ICD-10-CM | POA: Diagnosis not present

## 2019-04-24 DIAGNOSIS — Z0000 Encounter for general adult medical examination without abnormal findings: Secondary | ICD-10-CM | POA: Diagnosis not present

## 2019-04-29 ENCOUNTER — Telehealth: Payer: Self-pay | Admitting: Internal Medicine

## 2019-04-29 DIAGNOSIS — E782 Mixed hyperlipidemia: Secondary | ICD-10-CM | POA: Diagnosis not present

## 2019-04-29 DIAGNOSIS — I4891 Unspecified atrial fibrillation: Secondary | ICD-10-CM | POA: Diagnosis not present

## 2019-04-29 DIAGNOSIS — K219 Gastro-esophageal reflux disease without esophagitis: Secondary | ICD-10-CM | POA: Diagnosis not present

## 2019-04-29 DIAGNOSIS — R7301 Impaired fasting glucose: Secondary | ICD-10-CM | POA: Diagnosis not present

## 2019-04-29 MED ORDER — CARVEDILOL 3.125 MG PO TABS: 3.1250 mg | ORAL_TABLET | Freq: Two times a day (BID) | ORAL | 11 refills | Status: DC

## 2019-04-29 NOTE — Progress Notes (Signed)
HPI [name] returns today for follow up of sob. He is a pleasant 73 yo man with PAF who developed progressive dyspnea on exertion. He underwent stress testing 12/05/19  which was initially thought to be low risk with a small anterior/apical area of scar and ischemia on a lexiscan myoview stress test. His workup for unexplained sob demonstrated extensive 3 vessel CAD on chest CT. He has mild evidence of COPD by CT and PFT testing. He underewent left heart cath due to his extensive 3 vessel disease seen on CT scanning and sob and was found to have severe 3 vessel CAD with extensive disease in the LAD essentially involving the entire vessel with lesions up to 90%. Because of the diffuse and severe nature of his CAD, he was thought not to be a candidate for simple stenting. High risk directional atherectomy was considered. It was hoped that the patient's symptoms could be controlled with medical therapy. History of DVT and PAF also necessitates DOAC and he would be at higher risk of bleeding with triple Rx  [name] stopped his propafenone due to CAD and placed him on low dose amiodarone He called office 04/28/19 indicating daily chest pain Not all exertional and taking daily nitro but not always helping He refused to go to hospital due to [place] He tends to run low HR and we called in low dose coreg to add to his nitrate Rx.   This has helped 90% of his pain is atypical resting when he sits down at night Allergies  Allergen Reactions  . Morphine Nausea And Vomiting  . Morphine And Related Nausea And Vomiting  . Propofol Nausea And Vomiting     Current Outpatient Medications  Medication Sig Dispense Refill  . ALPRAZolam (XANAX) 1 MG tablet Take 1 mg by mouth at bedtime.  0  . amiodarone (PACERONE) 200 MG tablet Take 1 tablet (200 mg total) by mouth daily. 90 tablet 3  . apixaban (ELIQUIS) 5 MG TABS tablet Take 1 tablet (5 mg total) by mouth 2 (two) times daily.    [name] aspirin EC 81 MG tablet  Take 1 tablet (81 mg total) by mouth daily. 90 tablet 3  . atorvastatin (LIPITOR) 80 MG tablet Take 1 tablet (80 mg total) by mouth daily. 30 tablet 11  . Calcium Polycarbophil (FIBER-CAPS PO) Take 2 capsules by mouth daily.    . carvedilol (COREG) 3.125 MG tablet Take 1 tablet (3.125 mg total) by mouth 2 (two) times daily with a meal. 60 tablet 11  . Cholecalciferol (VITAMIN D3) 10 MCG (400 UNIT) CAPS Take 400 Units by mouth daily.    [name] docusate sodium (COLACE) 250 MG capsule Take 250 mg by mouth daily.    . finasteride (PROSCAR) 5 MG tablet Take 5 mg by mouth daily.  3  . gabapentin (NEURONTIN) 400 MG capsule One three times a day (Patient taking differently: Take 400 mg by mouth 3 (three) times daily. One three times a da)    . HYDROcodone-acetaminophen (NORCO) 10-325 MG tablet Take 1 tablet by mouth every 8 (eight) hours as needed for moderate pain.     . isosorbide mononitrate (IMDUR) 30 MG 24 hr tablet Take 1 tablet (30 mg total) by mouth daily. 30 tablet 11  . nitroGLYCERIN (NITROSTAT) 0.4 MG SL tablet Place 1 tablet (0.4 mg total) under the tongue every 5 (five) minutes as needed for chest pain. 25 tablet 5  . pantoprazole (PROTONIX) 40 MG tablet TAKE  30- 60 MIN BEFORE YOUR FIRST AND LAST MEALS OF THE DAY 180 tablet 0  . tamsulosin (FLOMAX) 0.4 MG CAPS capsule Take 0.4 mg by mouth at bedtime.    . vitamin E 200 UNIT capsule Take 200 Units by mouth daily.    . furosemide (LASIX) 20 MG tablet Take 1 tablet (20 mg total) by mouth daily. 90 tablet 3   No current facility-administered medications for this visit.      Past Medical History:  Diagnosis Date  . A-fib ([place])   . Arthritis    "all over my body" (06/17/2017)  . BPH (benign prostatic hyperplasia)    takes Proscar daily  . Chronic back pain    DDD; "upper and lower" (06/17/2017)  . Constipation    takes Colace daily  . DDD (degenerative disc disease), cervical   . DVT (deep venous thrombosis) ([facility])    left peroneal vein 07/2017  (in setting of recent left TKA)  . Dysrhythmia    history of Atrial fibrillation, no long has this  . Enlarged prostate   . GERD (gastroesophageal reflux disease)    takes Nexium daily  . H/O hiatal hernia   . History of colon polyps    benign  . History of gastric ulcer   . Insomnia    takes Xanax nightly  . Joint capsule tear    left knee  . Joint pain   . Joint swelling   . Macular degeneration   . Muscle spasm of back    takes Valium daily as needed  . PAF (paroxysmal atrial fibrillation) ([place])   . Peripheral neuropathy    takes Gabapentin daily  . PONV (postoperative nausea and vomiting)   . Sleep apnea    pt does not wear a cpap  . Urinary hesitancy   . Weakness    numbness and tingling    ROS:   All systems reviewed and negative except as noted in the HPI.   Past Surgical History:  Procedure Laterality Date  . ANTERIOR CERVICAL DECOMP/DISCECTOMY FUSION  ~ 1991   "took bone from hip"  . ANTERIOR CERVICAL DECOMP/DISCECTOMY FUSION  X 2   "put plate in"  . ANTERIOR CERVICAL DISCECTOMY  <11/2005    5-6 and 4-5./notes 05/02/2011  . APPENDECTOMY    . BACK SURGERY    . BIOPSY  05/30/2018   Procedure: BIOPSY;  Surgeon: [name], MD;  [facility];  Service: Endoscopy;;  esophagus  . CARDIAC CATHETERIZATION    . CARPAL TUNNEL RELEASE Bilateral 10/2005 - 11/2005   right-left [facility] Endo 05/02/2011  . CATARACT EXTRACTION W/PHACO Right 05/24/2014   Procedure: CATARACT EXTRACTION PHACO AND INTRAOCULAR LENS PLACEMENT (IOC);  Surgeon: [name], MD;  Location: [facility];  Service: Ophthalmology;  Laterality: Right;  CDE 6.74  . CATARACT EXTRACTION W/PHACO Left 06/17/2014   Procedure: CATARACT EXTRACTION PHACO AND INTRAOCULAR LENS PLACEMENT (IOC);  Surgeon: [name], MD;  Location: [facility];  Service: Ophthalmology;  Laterality: Left;  CDE:4.65  . COLONOSCOPY WITH ESOPHAGOGASTRODUODENOSCOPY (EGD) N/A 01/22/2013   Procedure: COLONOSCOPY WITH ESOPHAGOGASTRODUODENOSCOPY  (EGD);  Surgeon: [name], MD;  [facility];  Service: Endoscopy;  Laterality: N/A;  140  . COLONOSCOPY WITH PROPOFOL N/A 05/30/2018   Procedure: COLONOSCOPY WITH PROPOFOL;  Surgeon: [name], MD;  [facility];  Service: Endoscopy;  Laterality: N/A;  . ESOPHAGOGASTRODUODENOSCOPY (EGD) WITH PROPOFOL N/A 05/30/2018   Procedure: ESOPHAGOGASTRODUODENOSCOPY (EGD) WITH PROPOFOL;  Surgeon: Hildred Laser  U, MD;  [facility];  Service: Endoscopy;  Laterality: N/A;  . EYE SURGERY Bilateral    "cleaned film w/laser; since my 1st cataract OR"  . JOINT REPLACEMENT    . LEFT HEART CATH AND CORONARY ANGIOGRAPHY N/A 02/16/2019   Procedure: LEFT HEART CATH AND CORONARY ANGIOGRAPHY;  Surgeon: Belva Crome, MD;  Location: [facility];  Service: Cardiovascular;  Laterality: N/A;  . POSTERIOR LUMBAR FUSION     "put a plate in"  . QUADRICEPS TENDON REPAIR Left 07/01/2017  . QUADRICEPS TENDON REPAIR Left 07/01/2017   Procedure: REPAIR QUADRICEP TENDON;  Surgeon: [name], MD;  Location: [facility];  Service: Orthopedics;  Laterality: Left;  . SHOULDER ARTHROSCOPY Bilateral   . SPINAL CORD STIMULATOR INSERTION N/A 09/23/2015   Procedure: LUMBAR SPINAL CORD STIMULATOR INSERTION;  Surgeon: [name], MD;  Location: [facility];  Service: Neurosurgery;  Laterality: N/A;  LUMBAR SPINAL CORD STIMULATOR INSERTION  . TOTAL KNEE ARTHROPLASTY Left 06/17/2017  . TOTAL KNEE ARTHROPLASTY Left 06/17/2017   Procedure: TOTAL KNEE ARTHROPLASTY;  Surgeon: [name], MD;  Location: [facility];  Service: Orthopedics;  Laterality: Left;  . TOTAL KNEE ARTHROPLASTY Right 12/16/2017   Procedure: TOTAL KNEE ARTHROPLASTY;  Surgeon: [name], MD;  Location: [facility];  Service: Orthopedics;  Laterality: Right;     No family history on file.   Social History   Socioeconomic History  . Marital status: Significant Other    Spouse name: Not on file  . Number of children: Not on file   . Years of education: Not on file  . Highest education level: Not on file  Occupational History  . Not on file  Social Needs  . Financial resource strain: Not on file  . Food insecurity:    Worry: Not on file    Inability: Not on file  . Transportation needs:    Medical: Not on file    Non-medical: Not on file  Tobacco Use  . Smoking status: Former Smoker    Packs/day: 1.00    Years: 30.00    Pack years: 30.00    Types: Cigarettes    Last attempt to quit: 01/14/1987    Years since quitting: 32.3  . Smokeless tobacco: Former Systems developer    Types: Chew  . Tobacco comment: quit smoking  & chewing by 1987  Substance and Sexual Activity  . Alcohol use: Yes    Alcohol/week: 15.0 standard drinks    Types: 3 Glasses of wine, 12 Cans of beer per week    Comment: 2-3 times weekly   . Drug use: No  . Sexual activity: Not Currently  Lifestyle  . Physical activity:    Days per week: Not on file    Minutes per session: Not on file  . Stress: Not on file  Relationships  . Social connections:    Talks on phone: Not on file    Gets together: Not on file    Attends religious service: Not on file    Active member of club or organization: Not on file    Attends meetings of clubs or organizations: Not on file    Relationship status: Not on file  . Intimate partner violence:    Fear of current or ex partner: Not on file    Emotionally abused: Not on file    Physically abused: Not on file    Forced sexual activity: Not on file  Other Topics Concern  . Not on file  Social History Narrative  . Not on file     BP (!) 116/52   Pulse (!) 54   Ht 5\' 8"  (1.727 m)   Wt 70.8 kg   SpO2 95%   BMI 23.72 kg/m   Physical Exam:  Affect appropriate Healthy:  appears stated age HEENT: normal Neck supple with no adenopathy JVP normal no bruits no thyromegaly Lungs clear with no wheezing and good diaphragmatic motion Heart:  S1/S2 no murmur, no rub, gallop or click PMI normal Abdomen:  benighn, BS positve, no tenderness, no AAA no bruit.  No HSM or HJR Distal pulses intact with no bruits No edema Neuro non-focal Skin warm and dry No muscular weakness   Assess/Plan: 1. 3 vessel CAD - he has preserved LV function.Main issue is D1 that is not revascularize and LAD that is diffusely calcified with multiple areas of proximal and mid vessel disease Confounding this is limited ischemia on initial myovue 12/04/18 and not all of his pain being typical of angina . Continue with coreg and nitrates add Ranexa if has more typical symptoms and f/u myovue in 6 months to make sure ischemic burden is low   2. COPD - he stopped smoking 30 years ago. His PFT's are not too bad. Mild/mod defec . 3. Atrial fib - as he has CAD, now on low dose amiodarone per [name] ? Tikosyn would be better with his lung disease Patient would not go to hospital now due to [place] . 4. Dyslipidemia - I discussed the rationale for high dose lipitor. He will continue.  Jenkins Rouge

## 2019-04-29 NOTE — Telephone Encounter (Signed)
He has CAD that was not revascularized HR tends to be low without beta blocker Had myovue 12/19 with small infarct mild peri infarct ischemia. See if he can tolerate coreg 3.125 bid and tell him to keep track of his HR Next step would be to add Ranexa. Could repeat myovue and see if ischemic burden is larger but his last myovue was not that long ago  Hank- any thoughts You cathed him Having lots of chest pain since cath taking nitro all the time not always helping I see what you mean about the difficulty of intervening on LAD after review of angio

## 2019-04-29 NOTE — Telephone Encounter (Signed)
New patient     Pt c/o of Chest Pain: STAT if CP now or developed within 24 hours  1. Are you having CP right now?no   2. Are you experiencing any other symptoms (ex. SOB, nausea, vomiting, sweating)? No   3. How long have you been experiencing CP? Has had for about 2 weeks per Ovid Curd at Dr. Juel Burrow office   4. Is your CP continuous or coming and going? Continuous     5. Have you taken Nitroglycerin? No   Per Ovid Curd please contact the patient.  ?

## 2019-04-29 NOTE — Telephone Encounter (Signed)
Called Dr. Nevada Crane office and spoke with Ovid Curd his nurse. Patient called their office complaining of chest pain for 2 months. Patient told their office that he had reach out to his cardiology office but was unable to be see until August. Did not see a note stating patient called about any chest pain. Tried to call patient, no answer. Left message for patient to call back on his mobile and home phone. Will see if patient can come in for office visit on Dr. Kyla Balzarine DOD day next week or go to the ED.

## 2019-04-29 NOTE — Telephone Encounter (Signed)
Called patient again. Patient stated he has CP since his heart cath in March. Asked patient if he has been taking any nitro. Patient stated no, that he was taking it all the time, but it was not helping. Patient stated he will take it if he feels he is having a heart attack. Patient is taking his Imdur, but complains it gives him a headache. Informed patient that if his symptoms get worse he should go to the ED. Patient stated he is not going to the ED with this Corona virus going around. Made patient an appointment with Dr. Johnsie Cancel on his DOD day to get evaluated. Will send message to Dr. Johnsie Cancel for further advisement.   COVID-19 Pre-Screening Questions:  . In the past 7 to 10 days have you had a cough,  shortness of breath, headache, congestion, fever, body aches, chills, sore throat, or sudden loss of taste or sense of smell? NO . Have you been around anyone with known Covid 19. NO . Have you been around anyone who is awaiting Covid 19 test results in the past 7 to 10 days? NO . Have you been around anyone who has been exposed to Covid 19, or has mentioned symptoms of Covid 19 within the past 7 to 10 days? Not that he knows of.  If you have any concerns about symptoms your patients report please contact your leadership team, or the provider the patient is seeing in the office for further guidance.

## 2019-04-29 NOTE — Telephone Encounter (Signed)
Called patient with instructions to start Coreg 3.125 BID and keep track of his heart rate. Informed patient if his HR is below 50 to give our office a call. Informed patient to keep his appointment for next Wednesday. Patient verbalized understanding.

## 2019-04-30 NOTE — Telephone Encounter (Signed)
I don't see anything that could be causing near continuous chest pain. Diagonal is most severe lesion. Prox and mid LAD are moderately severe. 2nd and 3rd (last image of left coronary) OM's are moderate to severe(not noted on report). PDA prox eccentric 50%(not noted on report).  I was suspicious  that his discomfort were non-ischemic. If Diagonal is cause of symptoms, we won't be able to help.   Maybe CABG to LAD, diagonal (if graftable), OM 2 and 3, and PDA if convinced symptoms are real.

## 2019-04-30 NOTE — Telephone Encounter (Signed)
Thanks for reviewing Hank !!

## 2019-05-06 ENCOUNTER — Ambulatory Visit: Payer: Medicare Other | Admitting: Cardiovascular Disease

## 2019-05-06 ENCOUNTER — Other Ambulatory Visit: Payer: Self-pay

## 2019-05-06 ENCOUNTER — Encounter: Payer: Self-pay | Admitting: Cardiovascular Disease

## 2019-05-06 VITALS — BP 116/52 | HR 54 | Ht 68.0 in | Wt 156.0 lb

## 2019-05-06 DIAGNOSIS — R079 Chest pain, unspecified: Secondary | ICD-10-CM

## 2019-05-06 NOTE — Patient Instructions (Addendum)
Medication Instructions:   If you need a refill on your cardiac medications before your next appointment, please call your pharmacy.   Lab work:  If you have labs (blood work) drawn today and your tests are completely normal, you will receive your results only by: [name] MyChart Message (if you have MyChart) OR . A paper copy in the mail If you have any lab test that is abnormal or we need to change your treatment, we will call you to review the results.  Testing/Procedures: Your physician has requested that you have a lexiscan myoview in 6 months. For further information please visit [email or web address]. Please follow instruction sheet, as given.  Follow-Up: At [facility], you and your health needs are our priority.  As part of our continuing mission to provide you with exceptional heart care, we have created designated Provider Care Teams.  These Care Teams include your primary Cardiologist (physician) and Advanced Practice Providers (APPs -  Physician Assistants and Nurse Practitioners) who all work together to provide you with the care you need, when you need it.  Your physician wants you to follow-up in: 3 months with Dr. Lovena Le. Please move June appointment to August.  Your physician wants you to follow-up in: 6 months with Dr. Johnsie Cancel. You will receive a reminder letter in the mail two months in advance. If you don't receive a letter, please call our office to schedule the follow-up appointment.

## 2019-05-15 ENCOUNTER — Encounter (HOSPITAL_COMMUNITY): Payer: Medicare Other

## 2019-05-25 DIAGNOSIS — M961 Postlaminectomy syndrome, not elsewhere classified: Secondary | ICD-10-CM | POA: Diagnosis not present

## 2019-05-25 DIAGNOSIS — M5416 Radiculopathy, lumbar region: Secondary | ICD-10-CM | POA: Diagnosis not present

## 2019-06-03 ENCOUNTER — Ambulatory Visit: Payer: Medicare Other | Admitting: Internal Medicine

## 2019-06-05 DIAGNOSIS — E559 Vitamin D deficiency, unspecified: Secondary | ICD-10-CM | POA: Diagnosis not present

## 2019-06-05 DIAGNOSIS — K219 Gastro-esophageal reflux disease without esophagitis: Secondary | ICD-10-CM | POA: Diagnosis not present

## 2019-06-05 DIAGNOSIS — I4891 Unspecified atrial fibrillation: Secondary | ICD-10-CM | POA: Diagnosis not present

## 2019-06-05 DIAGNOSIS — E782 Mixed hyperlipidemia: Secondary | ICD-10-CM | POA: Diagnosis not present

## 2019-06-05 DIAGNOSIS — D519 Vitamin B12 deficiency anemia, unspecified: Secondary | ICD-10-CM | POA: Diagnosis not present

## 2019-06-09 DIAGNOSIS — R3911 Hesitancy of micturition: Secondary | ICD-10-CM | POA: Diagnosis not present

## 2019-06-16 ENCOUNTER — Ambulatory Visit (INDEPENDENT_AMBULATORY_CARE_PROVIDER_SITE_OTHER): Payer: Medicare Other | Admitting: Internal Medicine

## 2019-06-26 ENCOUNTER — Other Ambulatory Visit: Payer: Self-pay | Admitting: Internal Medicine

## 2019-07-14 DIAGNOSIS — E782 Mixed hyperlipidemia: Secondary | ICD-10-CM | POA: Diagnosis not present

## 2019-07-14 DIAGNOSIS — I4891 Unspecified atrial fibrillation: Secondary | ICD-10-CM | POA: Diagnosis not present

## 2019-07-14 DIAGNOSIS — K219 Gastro-esophageal reflux disease without esophagitis: Secondary | ICD-10-CM | POA: Diagnosis not present

## 2019-07-14 DIAGNOSIS — D519 Vitamin B12 deficiency anemia, unspecified: Secondary | ICD-10-CM | POA: Diagnosis not present

## 2019-07-14 DIAGNOSIS — E559 Vitamin D deficiency, unspecified: Secondary | ICD-10-CM | POA: Diagnosis not present

## 2019-07-22 ENCOUNTER — Encounter (HOSPITAL_COMMUNITY): Payer: Self-pay | Admitting: Emergency Medicine

## 2019-07-22 ENCOUNTER — Emergency Department (HOSPITAL_COMMUNITY): Payer: Medicare Other

## 2019-07-22 ENCOUNTER — Emergency Department (HOSPITAL_COMMUNITY)
Admission: EM | Admit: 2019-07-22 | Discharge: 2019-07-22 | Disposition: A | Discharge status: Home / Self Care | Payer: Medicare Other | Attending: Emergency Medicine | Admitting: Emergency Medicine

## 2019-07-22 ENCOUNTER — Other Ambulatory Visit: Payer: Self-pay

## 2019-07-22 DIAGNOSIS — S6991XA Unspecified injury of right wrist, hand and finger(s), initial encounter: Secondary | ICD-10-CM | POA: Diagnosis present

## 2019-07-22 DIAGNOSIS — Z7901 Long term (current) use of anticoagulants: Secondary | ICD-10-CM | POA: Insufficient documentation

## 2019-07-22 DIAGNOSIS — Z23 Encounter for immunization: Secondary | ICD-10-CM | POA: Diagnosis not present

## 2019-07-22 DIAGNOSIS — Y9389 Activity, other specified: Secondary | ICD-10-CM | POA: Insufficient documentation

## 2019-07-22 DIAGNOSIS — Y999 Unspecified external cause status: Secondary | ICD-10-CM | POA: Insufficient documentation

## 2019-07-22 DIAGNOSIS — W311XXA Contact with metalworking machines, initial encounter: Secondary | ICD-10-CM | POA: Diagnosis not present

## 2019-07-22 DIAGNOSIS — Z87891 Personal history of nicotine dependence: Secondary | ICD-10-CM | POA: Insufficient documentation

## 2019-07-22 DIAGNOSIS — Z79899 Other long term (current) drug therapy: Secondary | ICD-10-CM | POA: Insufficient documentation

## 2019-07-22 DIAGNOSIS — I2510 Atherosclerotic heart disease of native coronary artery without angina pectoris: Secondary | ICD-10-CM | POA: Insufficient documentation

## 2019-07-22 DIAGNOSIS — Y929 Unspecified place or not applicable: Secondary | ICD-10-CM | POA: Diagnosis not present

## 2019-07-22 DIAGNOSIS — S61411A Laceration without foreign body of right hand, initial encounter: Secondary | ICD-10-CM | POA: Insufficient documentation

## 2019-07-22 DIAGNOSIS — Z7982 Long term (current) use of aspirin: Secondary | ICD-10-CM | POA: Insufficient documentation

## 2019-07-22 DIAGNOSIS — S61216A Laceration without foreign body of right little finger without damage to nail, initial encounter: Secondary | ICD-10-CM | POA: Insufficient documentation

## 2019-07-22 MED ORDER — LIDOCAINE-EPINEPHRINE (PF) 2 %-1:200000 IJ SOLN
10.0000 mL | Freq: Once | INTRAMUSCULAR | Status: AC
  Administered 2019-07-22: 10 mL
  Filled 2019-07-22: qty 10

## 2019-07-22 MED ORDER — TETANUS-DIPHTH-ACELL PERTUSSIS 5-2.5-18.5 LF-MCG/0.5 IM SUSP
0.5000 mL | Freq: Once | INTRAMUSCULAR | Status: AC
  Administered 2019-07-22: 0.5 mL via INTRAMUSCULAR
  Filled 2019-07-22: qty 0.5

## 2019-07-22 MED ORDER — CEPHALEXIN 500 MG PO CAPS
500.0000 mg | ORAL_CAPSULE | Freq: Once | ORAL | Status: AC
  Administered 2019-07-22: 500 mg via ORAL
  Filled 2019-07-22: qty 1

## 2019-07-22 MED ORDER — CEPHALEXIN 500 MG PO CAPS: 500.0000 mg | ORAL_CAPSULE | Freq: Four times a day (QID) | ORAL | 0 refills | Status: DC

## 2019-07-22 NOTE — ED Triage Notes (Signed)
Bleeding controled with pressure.

## 2019-07-22 NOTE — ED Provider Notes (Signed)
[facility] EMERGENCY DEPARTMENT Provider Note   CSN: [id number] Arrival date & time: 07/22/19  1734     History   Chief Complaint Chief Complaint  Patient presents with  . Extremity Laceration    HPI Christopher [name] is a 73 y.o. male.     Christopher [name] is a 73 y.o. male with a history of A. fib, on chronic anticoagulation, GERD, BPH, degenerative joint disease, who presents to the ED for evaluation of laceration to the back of his right hand.  Patient reports he was working with a grinder blade and this fell off the table he tried to stop it and it struck the back of his right hand causing laceration.  He and his family member were unable to keep bleeding under control at home, he is on both Eliquis and aspirin.  He denies any numbness or tingling in the fingers or hand.  He is able to move all fingers without difficulty.  Unsure of his last tetanus vaccination.  Nothing for pain prior to arrival reports moderate pain over the laceration that is a constant ache.  No other aggravating or alleviating factors.     Past Medical History:  Diagnosis Date  . A-fib ([place])   . Arthritis    "all over my body" (06/17/2017)  . BPH (benign prostatic hyperplasia)    takes Proscar daily  . Chronic back pain    DDD; "upper and lower" (06/17/2017)  . Constipation    takes Colace daily  . DDD (degenerative disc disease), cervical   . DVT (deep venous thrombosis) (H[facility])    left peroneal vein 07/2017 (in setting of recent left TKA)  . Dysrhythmia    history of Atrial fibrillation, no long has this  . Enlarged prostate   . GERD (gastroesophageal reflux disease)    takes Nexium daily  . H/O hiatal hernia   . History of colon polyps    benign  . History of gastric ulcer   . Insomnia    takes Xanax nightly  . Joint capsule tear    left knee  . Joint pain   . Joint swelling   . Macular degeneration   . Muscle spasm of back    takes Valium daily as needed  . PAF (paroxysmal atrial fibrillation)  ([place])   . Peripheral neuropathy    takes Gabapentin daily  . PONV (postoperative nausea and vomiting)   . Sleep apnea    pt does not wear a cpap  . Urinary hesitancy   . Weakness    numbness and tingling    Patient Active Problem List   Diagnosis Date Noted  . Coronary artery disease involving native coronary artery of native heart with angina pectoris ([place])   . Upper airway cough syndrome 01/05/2019  . DOE (dyspnea on exertion) 12/15/2018  . Gastroesophageal reflux disease 04/28/2018  . Hx of colonic polyps 04/28/2018  . Barrett's esophagus without dysplasia 04/15/2018  . Gastroesophageal reflux disease without esophagitis 04/15/2018  . History of colonic polyps 04/15/2018  . Atrial fibrillation with RVR ([place]) 07/23/2017  . History of gastric ulcer   . Chronic back pain   . BPH (benign prostatic hyperplasia)   . 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 Chronic pain of right knee 05/14/2017  . Spondylolisthesis of lumbar region 03/05/2014  . Barrett esophagus 02/24/2013  . Personal history of colonic polyps 02/24/2013  . Epigastric pain 01/06/2013  . Encounter  for screening colonoscopy 01/06/2013  . Pain in joint, shoulder region 07/09/2012  . Muscle weakness (generalized) 07/09/2012    Past Surgical History:  Procedure Laterality Date  . ANTERIOR CERVICAL DECOMP/DISCECTOMY FUSION  ~ 1991   "took bone from hip"  . ANTERIOR CERVICAL DECOMP/DISCECTOMY FUSION  X 2   "put plate in"  . ANTERIOR CERVICAL DISCECTOMY  <11/2005    5-6 and 4-5./notes 05/02/2011  . APPENDECTOMY    . BACK SURGERY    . BIOPSY  05/30/2018   Procedure: BIOPSY;  Surgeon: [name], MD;  Location: [facility];  Service: Endoscopy;;  esophagus  . CARDIAC CATHETERIZATION    . CARPAL TUNNEL RELEASE Bilateral 10/2005 - 11/2005   right-left Ar[facility] Endo 05/02/2011  . CATARACT EXTRACTION W/PHACO Right 05/24/2014   Procedure: CATARACT EXTRACTION PHACO AND  INTRAOCULAR LENS PLACEMENT (IOC);  Surgeon: [name], MD;  Location: [facility] OR[facility];  Service: Ophthalmology;  Laterality: Right;  CDE 6.74  . CATARACT EXTRACTION W/PHACO Left 06/17/2014   Procedure: CATARACT EXTRACTION PHACO AND INTRAOCULAR LENS PLACEMENT (IOC);  Surgeon: [name], MD;  Location: [facility];  Service: Ophthalmology;  Laterality: Left;  CDE:4.65  . COLONOSCOPY WITH ESOPHAGOGASTRODUODENOSCOPY (EGD) N/A 01/22/2013   Procedure: COLONOSCOPY WITH ESOPHAGOGASTRODUODENOSCOPY (EGD);  Surgeon: [name], MD;  [facility];  Service: Endoscopy;  Laterality: N/A;  140  . COLONOSCOPY WITH PROPOFOL N/A 05/30/2018   Procedure: COLONOSCOPY WITH PROPOFOL;  Surgeon: [name], MD;  [facility];  Service: Endoscopy;  Laterality: N/A;  . ESOPHAGOGASTRODUODENOSCOPY (EGD) WITH PROPOFOL N/A 05/30/2018   Procedure: ESOPHAGOGASTRODUODENOSCOPY (EGD) WITH PROPOFOL;  Surgeon: [name], MD;  [facility];  Service: Endoscopy;  Laterality: N/A;  . EYE SURGERY Bilateral    "cleaned film w/laser; since my 1st cataract OR"  . JOINT REPLACEMENT    . LEFT HEART CATH AND CORONARY ANGIOGRAPHY N/A 02/16/2019   Procedure: LEFT HEART CATH AND CORONARY ANGIOGRAPHY;  Surgeon: Belva Crome, MD;  Location: [place] [facility];  Service: Cardiovascular;  Laterality: N/A;  . POSTERIOR LUMBAR FUSION     "put a plate in"  . QUADRICEPS TENDON REPAIR Left 07/01/2017  . QUADRICEPS TENDON REPAIR Left 07/01/2017   Procedure: REPAIR QUADRICEP TENDON;  Surgeon: [name], MD;  Location: [place];  Service: Orthopedics;  Laterality: Left;  . SHOULDER ARTHROSCOPY Bilateral   . SPINAL CORD STIMULATOR INSERTION N/A 09/23/2015   Procedure: LUMBAR SPINAL CORD STIMULATOR INSERTION;  Surgeon: [name], MD;  Location: [place] [facility];  Service: Neurosurgery;  Laterality: N/A;  LUMBAR SPINAL CORD STIMULATOR INSERTION  . TOTAL KNEE ARTHROPLASTY Left 06/17/2017  . TOTAL KNEE ARTHROPLASTY Left 06/17/2017    Procedure: TOTAL KNEE ARTHROPLASTY;  Surgeon: [name], MD;  Location: [place];  Service: Orthopedics;  Laterality: Left;  . TOTAL KNEE ARTHROPLASTY Right 12/16/2017   Procedure: TOTAL KNEE ARTHROPLASTY;  Surgeon: [name], MD;  Location: [place];  Service: Orthopedics;  Laterality: Right;        Home Medications    Prior to Admission medications   Medication Sig Start Date End Date Taking? Authorizing Provider  ALPRAZolam Duanne Moron) 1 MG tablet Take 1 mg by mouth at bedtime. 09/27/17   [provider]  amiodarone (PACERONE) 200 MG tablet Take 1 tablet (200 mg total) by mouth daily. 02/27/19   Evans Lance, MD  apixaban (ELIQUIS) 5 MG TABS tablet Take 1 tablet (5 mg total) by mouth 2 (two) times daily. 05/31/18  [name], MD  aspirin EC 81 MG tablet Take 1 tablet (81 mg total) by mouth daily. 02/11/19   Richardson Dopp T, PA-C  atorvastatin (LIPITOR) 80 MG tablet Take 1 tablet (80 mg total) by mouth daily. 02/16/19 02/16/20  Belva Crome, MD  Calcium Polycarbophil (FIBER-CAPS PO) Take 2 capsules by mouth daily.    [provider]  carvedilol (COREG) 3.125 MG tablet Take 1 tablet (3.125 mg total) by mouth 2 (two) times daily with a meal. 04/29/19   Josue Hector, MD  Cholecalciferol (VITAMIN D3) 10 MCG (400 UNIT) CAPS Take 400 Units by mouth daily.    [provider]  docusate sodium (COLACE) 250 MG capsule Take 250 mg by mouth daily.    [provider]  finasteride (PROSCAR) 5 MG tablet Take 5 mg by mouth daily. 10/17/18   [provider]  furosemide (LASIX) 20 MG tablet Take 1 tablet (20 mg total) by mouth daily. 12/31/18 03/31/19  Evans Lance, MD  gabapentin (NEURONTIN) 400 MG capsule One three times a day Patient taking differently: Take 400 mg by mouth 3 (three) times daily. One three times a da 01/05/19   Tanda Rockers, MD  HYDROcodone-acetaminophen (NORCO) 10-325 MG tablet Take 1 tablet by mouth every 8 (eight) hours as needed for  moderate pain.     [provider]  isosorbide mononitrate (IMDUR) 30 MG 24 hr tablet Take 1 tablet (30 mg total) by mouth daily. 02/16/19 02/16/20  Belva Crome, MD  nitroGLYCERIN (NITROSTAT) 0.4 MG SL tablet Place 1 tablet (0.4 mg total) under the tongue every 5 (five) minutes as needed for chest pain. 02/11/19   Richardson Dopp T, PA-C  pantoprazole (PROTONIX) 40 MG tablet TAKE 1 TABLET BY MOUTH TWICE A DAY (30 TO 60 MINUTES BEFORE 1ST AND LAST MEALS OF THE DAY) 06/26/19   Tanda Rockers, MD  tamsulosin (FLOMAX) 0.4 MG CAPS capsule Take 0.4 mg by mouth at bedtime.    [provider]  vitamin E 200 UNIT capsule Take 200 Units by mouth daily.    [provider]    Family History No family history on file.  Social History Social History   Tobacco Use  . Smoking status: Former Smoker    Packs/day: 1.00    Years: 30.00    Pack years: 30.00    Types: Cigarettes    Quit date: 01/14/1987    Years since quitting: 32.5  . Smokeless tobacco: Former Systems developer    Types: Chew  . Tobacco comment: quit smoking  & chewing by 1987  Substance Use Topics  . Alcohol use: Yes    Alcohol/week: 15.0 standard drinks    Types: 3 Glasses of wine, 12 Cans of beer per week    Comment: 2-3 times weekly   . Drug use: No     Allergies   Morphine, Morphine and related, and Propofol   Review of Systems Review of Systems  Constitutional: Negative for chills and fever.  Musculoskeletal: Positive for arthralgias.  Skin: Positive for wound.  Neurological: Negative for weakness and numbness.     Physical Exam Updated Vital Signs BP 108/64 (BP Location: Right Arm)   Pulse 64   Temp 98.4 F (36.9 C) (Oral)   Resp 16   Ht 5\' 8"  (1.727 m)   Wt 69.9 kg   SpO2 95%   BMI 23.42 kg/m   Physical Exam Vitals signs and nursing note reviewed.  Constitutional:  General: He is not in acute distress.    Appearance: Normal appearance. He is well-developed and normal weight. He is not  ill-appearing or diaphoretic.  HENT:     Head: Normocephalic and atraumatic.  Eyes:     General:        Right eye: No discharge.        Left eye: No discharge.  Pulmonary:     Effort: Pulmonary effort is normal. No respiratory distress.  Musculoskeletal:     Comments: Laceration on the dorsum of the right hand extending across the MCP joints of the third fourth and fifth fingers, actively bleeding, controlled in triage with pressure dressing.  There is also a smaller 2 cm laceration over the dorsum of the fifth finger over the proximal phalanx.  All fingers with good capillary refill normal sensation and 5/5 strength with flexion and extension.  Skin:    General: Skin is warm and dry.     Capillary Refill: Capillary refill takes less than 2 seconds.  Neurological:     Mental Status: He is alert and oriented to person, place, and time.     Coordination: Coordination normal.  Psychiatric:        Mood and Affect: Mood normal.        Behavior: Behavior normal.      ED Treatments / Results  Labs (all labs ordered are listed, but only abnormal results are displayed) Labs Reviewed - No data to display  EKG None  Radiology No results found.  Procedures .Marland KitchenLaceration Repair  Date/Time: 07/22/2019 8:51 PM Performed by: Jacqlyn Larsen, PA-C Authorized by: Jacqlyn Larsen, PA-C   Consent:    Consent obtained:  Verbal   Consent given by:  Patient   Risks discussed:  Infection, pain, need for additional repair, poor cosmetic result and poor wound healing   Alternatives discussed:  No treatment Anesthesia (see MAR for exact dosages):    Anesthesia method:  Local infiltration   Local anesthetic:  Lidocaine 2% WITH epi Laceration details:    Location:  Hand   Hand location:  R hand, dorsum   Length (cm):  5   Depth (mm):  5 Repair type:    Repair type:  Simple Pre-procedure details:    Preparation:  Patient was prepped and draped in usual sterile fashion and imaging obtained to  evaluate for foreign bodies Exploration:    Hemostasis achieved with:  Direct pressure and epinephrine   Wound exploration: wound explored through full range of motion and entire depth of wound probed and visualized     Wound extent: areolar tissue violated     Wound extent: no underlying fracture noted   Treatment:    Area cleansed with:  Saline   Amount of cleaning:  Standard   Irrigation solution:  Sterile saline   Irrigation method:  Syringe Skin repair:    Repair method:  Sutures   Suture size:  4-0   Suture material:  Prolene   Suture technique:  Simple interrupted   Number of sutures:  10 Approximation:    Approximation:  Close Post-procedure details:    Dressing:  Bulky dressing   Patient tolerance of procedure:  Tolerated well, no immediate complications Comments:     10 sutures placed in wound which was initially heavily bleeding, no arterial bleeding evident, after sutures placed, good hemostasis.  [name].Laceration Repair  Date/Time: 07/22/2019 8:57 PM Performed by: Jacqlyn Larsen, PA-C Authorized by: Jacqlyn Larsen, PA-C   Consent:  Consent obtained:  Verbal   Consent given by:  Patient   Risks discussed:  Infection, pain, poor cosmetic result, poor wound healing and need for additional repair   Alternatives discussed:  No treatment Anesthesia (see MAR for exact dosages):    Anesthesia method:  Local infiltration   Local anesthetic:  Lidocaine 2% WITH epi Laceration details:    Location:  Finger   Finger location:  R small finger   Length (cm):  2   Depth (mm):  3 Repair type:    Repair type:  Simple Pre-procedure details:    Preparation:  Patient was prepped and draped in usual sterile fashion and imaging obtained to evaluate for foreign bodies Exploration:    Hemostasis achieved with:  Epinephrine and direct pressure   Wound exploration: wound explored through full range of motion and entire depth of wound probed and visualized     Wound extent: areolar  tissue violated     Wound extent: no nerve damage noted, no tendon damage noted and no underlying fracture noted   Treatment:    Area cleansed with:  Saline   Amount of cleaning:  Standard   Irrigation solution:  Sterile saline   Irrigation method:  Syringe Skin repair:    Repair method:  Sutures   Suture size:  4-0   Suture material:  Prolene   Suture technique:  Simple interrupted   Number of sutures:  2 Approximation:    Approximation:  Close Post-procedure details:    Dressing:  Bulky dressing   Patient tolerance of procedure:  Tolerated well, no immediate complications   (including critical care time)  Medications Ordered in ED Medications  cephALEXin (KEFLEX) capsule 500 mg (has no administration in time range)  Tdap (BOOSTRIX) injection 0.5 mL (0.5 mLs Intramuscular Given 07/22/19 1928)  lidocaine-EPINEPHrine (XYLOCAINE W/EPI) 2 %-1:200000 (PF) injection 10 mL (10 mLs Infiltration Given by Other 07/22/19 1957)     Initial Impression / Assessment and Plan / ED Course  I have reviewed the triage vital signs and the nursing notes.  Pertinent labs & imaging results that were available during my care of the patient were reviewed by me and considered in my medical decision making (see chart for details).  73 year old male presents with lacerations to the dorsum of the right hand and right fifth finger after a grinder blade fell off the table hitting the back of his hand.  Patient initially bleeding heavily, on both aspirin and Eliquis, no evidence of arterial bleeding and no evidence of tendon or nerve damage, good capillary refill in all fingers.  Imaging obtained to rule out foreign bodies which showed no evidence of foreign body or fracture.  Tetanus updated.  Able to control bleeding in both lacerations with simple interrupted sutures.  Pressure dressing placed and patient observed here in the ED with no further bleeding through sutures.  Patient will be placed on Keflex for  infection prophylaxis.  PCP follow-up for suture removal in about 10 days.  Return precautions discussed and appropriate wound care discussed.  Patient expresses understanding and agreement with plan.  Discharged home in good condition.  Patient discussed with Dr. Roderic Palau, who saw patient as well and agrees with plan.   Final Clinical Impressions(s) / ED Diagnoses   Final diagnoses:  Laceration of right hand without foreign body, initial encounter  Current use of long term anticoagulation    ED Discharge Orders         Ordered    cephALEXin (KEFLEX)  500 MG capsule  4 times daily     07/22/19 2043           Janet Berlin 07/22/19 2113    Milton Ferguson, MD 07/24/19 [phone]

## 2019-07-22 NOTE — ED Notes (Signed)
Bandaged pt hand using Telfa, coban and curex. Pt able to move all fingers. Bleeding controlled.

## 2019-07-22 NOTE — ED Triage Notes (Signed)
Pt states that he had a grinder cut the back of his left hand. Bleeding control at [place]

## 2019-07-22 NOTE — ED Notes (Signed)
Patient HR 50. Pt stated this is normal for him because he takes BP medication.

## 2019-07-22 NOTE — Discharge Instructions (Addendum)
Stitches will need to be removed in about 7-10 days.  Take entire course of antibiotics are directed. Use tylenol as needed for pain. Ice and elevate the hand. If any bleeding occurs apply pressure for at least 20 minutes, if unable to control bleeding, return to the emergency department.  Keep wound clean dry and covered, you may shower and wash her hands but do not soak or submerge the hand underwater.  Monitor for any signs of infection such as redness, swelling, increasing pain or drainage, warmth or fever, if these occur please return for reevaluation.

## 2019-07-28 DIAGNOSIS — S61411D Laceration without foreign body of right hand, subsequent encounter: Secondary | ICD-10-CM | POA: Diagnosis not present

## 2019-07-31 DIAGNOSIS — D519 Vitamin B12 deficiency anemia, unspecified: Secondary | ICD-10-CM | POA: Diagnosis not present

## 2019-07-31 DIAGNOSIS — I4891 Unspecified atrial fibrillation: Secondary | ICD-10-CM | POA: Diagnosis not present

## 2019-07-31 DIAGNOSIS — K219 Gastro-esophageal reflux disease without esophagitis: Secondary | ICD-10-CM | POA: Diagnosis not present

## 2019-07-31 DIAGNOSIS — E559 Vitamin D deficiency, unspecified: Secondary | ICD-10-CM | POA: Diagnosis not present

## 2019-07-31 DIAGNOSIS — E782 Mixed hyperlipidemia: Secondary | ICD-10-CM | POA: Diagnosis not present

## 2019-08-05 ENCOUNTER — Ambulatory Visit: Payer: Medicare Other | Admitting: Internal Medicine

## 2019-08-05 ENCOUNTER — Encounter: Payer: Self-pay | Admitting: Internal Medicine

## 2019-08-05 ENCOUNTER — Other Ambulatory Visit: Payer: Self-pay

## 2019-08-05 VITALS — BP 98/48 | HR 57 | Temp 97.8°F | Ht 68.0 in | Wt 161.0 lb

## 2019-08-05 DIAGNOSIS — I480 Paroxysmal atrial fibrillation: Secondary | ICD-10-CM | POA: Diagnosis not present

## 2019-08-05 NOTE — Progress Notes (Signed)
HPI Mr. Kakar returns today for followup of his PAF, CAD, and HTN. He has extensive 3 vessel disease. He recently had an accident with his equipment and sliced the dorsal service of his hand requiring a dozen stitches.Over the past few weeks, he notes that he has had more chest pressure and sob. He severe 3 vessel CAD and was not thought to be amendable for revascularization. Dr. Tamala Julian noted that if his symptoms worsened, rotational atherectomy would be a consideration. Over the past 2 weeks he has had more typical anginal symptoms. They are now class 3. He can do almost nothing without symptoms. He denies medical non-compliance.  Allergies  Allergen Reactions  . Morphine Nausea And Vomiting  . Morphine And Related Nausea And Vomiting  . Propofol Nausea And Vomiting     Current Outpatient Medications  Medication Sig Dispense Refill  . ALPRAZolam (XANAX) 1 MG tablet Take 1 mg by mouth at bedtime.  0  . amiodarone (PACERONE) 200 MG tablet Take 1 tablet (200 mg total) by mouth daily. 90 tablet 3  . apixaban (ELIQUIS) 5 MG TABS tablet Take 1 tablet (5 mg total) by mouth 2 (two) times daily.    [name] aspirin EC 81 MG tablet Take 1 tablet (81 mg total) by mouth daily. 90 tablet 3  . atorvastatin (LIPITOR) 80 MG tablet Take 1 tablet (80 mg total) by mouth daily. 30 tablet 11  . Calcium Polycarbophil (FIBER-CAPS PO) Take 2 capsules by mouth daily.    . carvedilol (COREG) 3.125 MG tablet Take 1 tablet (3.125 mg total) by mouth 2 (two) times daily with a meal. 60 tablet 11  . cephALEXin (KEFLEX) 500 MG capsule Take 1 capsule (500 mg total) by mouth 4 (four) times daily. 28 capsule 0  . Cholecalciferol (VITAMIN D3) 10 MCG (400 UNIT) CAPS Take 400 Units by mouth daily.    [name] docusate sodium (COLACE) 250 MG capsule Take 250 mg by mouth daily.    . finasteride (PROSCAR) 5 MG tablet Take 5 mg by mouth daily.  3  . gabapentin (NEURONTIN) 400 MG capsule One three times a day (Patient taking differently:  Take 400 mg by mouth 3 (three) times daily. One three times a da)    . HYDROcodone-acetaminophen (NORCO) 10-325 MG tablet Take 1 tablet by mouth every 8 (eight) hours as needed for moderate pain.     . isosorbide mononitrate (IMDUR) 30 MG 24 hr tablet Take 1 tablet (30 mg total) by mouth daily. 30 tablet 11  . nitroGLYCERIN (NITROSTAT) 0.4 MG SL tablet Place 1 tablet (0.4 mg total) under the tongue every 5 (five) minutes as needed for chest pain. 25 tablet 5  . pantoprazole (PROTONIX) 40 MG tablet TAKE 1 TABLET BY MOUTH TWICE A DAY (30 TO 60 MINUTES BEFORE 1ST AND LAST MEALS OF THE DAY) 180 tablet 0  . tamsulosin (FLOMAX) 0.4 MG CAPS capsule Take 0.4 mg by mouth at bedtime.    . vitamin E 200 UNIT capsule Take 200 Units by mouth daily.    . furosemide (LASIX) 20 MG tablet Take 1 tablet (20 mg total) by mouth daily. 90 tablet 3   No current facility-administered medications for this visit.      Past Medical History:  Diagnosis Date  . A-fib ([place])   . Arthritis    "all over my body" (06/17/2017)  . BPH (benign prostatic hyperplasia)    takes Proscar daily  . Chronic back pain  DDD; "upper and lower" (06/17/2017)  . Constipation    takes Colace daily  . DDD (degenerative disc disease), cervical   . DVT (deep venous thrombosis) ([facility])    left peroneal vein 07/2017 (in setting of recent left TKA)  . Dysrhythmia    history of Atrial fibrillation, no long has this  . Enlarged prostate   . GERD (gastroesophageal reflux disease)    takes Nexium daily  . H/O hiatal hernia   . History of colon polyps    benign  . History of gastric ulcer   . Insomnia    takes Xanax nightly  . Joint capsule tear    left knee  . Joint pain   . Joint swelling   . Macular degeneration   . Muscle spasm of back    takes Valium daily as needed  . PAF (paroxysmal atrial fibrillation) ([place])   . Peripheral neuropathy    takes Gabapentin daily  . PONV (postoperative nausea and vomiting)   . Sleep apnea    pt  does not wear a cpap  . Urinary hesitancy   . Weakness    numbness and tingling    ROS:   All systems reviewed and negative except as noted in the HPI.   Past Surgical History:  Procedure Laterality Date  . ANTERIOR CERVICAL DECOMP/DISCECTOMY FUSION  ~ 1991   "took bone from hip"  . ANTERIOR CERVICAL DECOMP/DISCECTOMY FUSION  X 2   "put plate in"  . ANTERIOR CERVICAL DISCECTOMY  <11/2005    5-6 and 4-5./notes 05/02/2011  . APPENDECTOMY    . BACK SURGERY    . BIOPSY  05/30/2018   Procedure: BIOPSY;  Surgeon: [name], MD;  Location[facility];  Service: Endoscopy;;  esophagus  . CARDIAC CATHETERIZATION    . CARPAL TUNNEL RELEASE Bilateral 10/2005 - 11/2005   right-left [facility] 05/02/2011  . CATARACT EXTRACTION W/PHACO Right 05/24/2014   Procedure: CATARACT EXTRACTION PHACO AND INTRAOCULAR LENS PLACEMENT (IOC);  Surgeon: [name], MD;  Location: [facility];  Service: Ophthalmology;  Laterality: Right;  CDE 6.74  . CATARACT EXTRACTION W/PHACO Left 06/17/2014   Procedure: CATARACT EXTRACTION PHACO AND INTRAOCULAR LENS PLACEMENT (IOC);  Surgeon: [name], MD;  Location: [facility];  Service: Ophthalmology;  Laterality: Left;  CDE:4.65  . COLONOSCOPY WITH ESOPHAGOGASTRODUODENOSCOPY (EGD) N/A 01/22/2013   Procedure: COLONOSCOPY WITH ESOPHAGOGASTRODUODENOSCOPY (EGD);  Surgeon: [name], MD;  [facility];  Service: Endoscopy;  Laterality: N/A;  140  . COLONOSCOPY WITH PROPOFOL N/A 05/30/2018   Procedure: COLONOSCOPY WITH PROPOFOL;  Surgeon: [name], MD;  [facility];  Service: Endoscopy;  Laterality: N/A;  . ESOPHAGOGASTRODUODENOSCOPY (EGD) WITH PROPOFOL N/A 05/30/2018   Procedure: ESOPHAGOGASTRODUODENOSCOPY (EGD) WITH PROPOFOL;  Surgeon: [name], MD;  [facility];  Service: Endoscopy;  Laterality: N/A;  . EYE SURGERY Bilateral    "cleaned film w/laser; since my 1st cataract OR"  . JOINT REPLACEMENT    . LEFT HEART CATH AND  CORONARY ANGIOGRAPHY N/A 02/16/2019   Procedure: LEFT HEART CATH AND CORONARY ANGIOGRAPHY;  Surgeon: Belva Crome, MD;  Location: [place] [facility];  Service: Cardiovascular;  Laterality: N/A;  . POSTERIOR LUMBAR FUSION     "put a plate in"  . QUADRICEPS TENDON REPAIR Left 07/01/2017  . QUADRICEPS TENDON REPAIR Left 07/01/2017   Procedure: REPAIR QUADRICEP TENDON;  Surgeon: [name], MD;  Location: [facility];  Service: Orthopedics;  Laterality: Left;  .  SHOULDER ARTHROSCOPY Bilateral   . SPINAL CORD STIMULATOR INSERTION N/A 09/23/2015   Procedure: LUMBAR SPINAL CORD STIMULATOR INSERTION;  Surgeon: [name], MD;  Location: [place] [facility];  Service: Neurosurgery;  Laterality: N/A;  LUMBAR SPINAL CORD STIMULATOR INSERTION  . TOTAL KNEE ARTHROPLASTY Left 06/17/2017  . TOTAL KNEE ARTHROPLASTY Left 06/17/2017   Procedure: TOTAL KNEE ARTHROPLASTY;  Surgeon: [name], MD;  Location: [place];  Service: Orthopedics;  Laterality: Left;  . TOTAL KNEE ARTHROPLASTY Right 12/16/2017   Procedure: TOTAL KNEE ARTHROPLASTY;  Surgeon: [name], MD;  Location: [facility];  Service: Orthopedics;  Laterality: Right;     History reviewed. No pertinent family history.   Social History   Socioeconomic History  . Marital status: Significant Other    Spouse name: Not on file  . Number of children: Not on file  . Years of education: Not on file  . Highest education level: Not on file  Occupational History  . Not on file  Social Needs  . Financial resource strain: Not on file  . Food insecurity    Worry: Not on file    Inability: Not on file  . Transportation needs    Medical: Not on file    Non-medical: Not on file  Tobacco Use  . Smoking status: Former Smoker    Packs/day: 1.00    Years: 30.00    Pack years: 30.00    Types: Cigarettes    Quit date: 01/14/1987    Years since quitting: 32.5  . Smokeless tobacco: Former Systems developer    Types: Chew  . Tobacco comment: quit smoking  & chewing by 1987   Substance and Sexual Activity  . Alcohol use: Yes    Alcohol/week: 15.0 standard drinks    Types: 3 Glasses of wine, 12 Cans of beer per week    Comment: 2-3 times weekly   . Drug use: No  . Sexual activity: Not Currently  Lifestyle  . Physical activity    Days per week: Not on file    Minutes per session: Not on file  . Stress: Not on file  Relationships  . Social Herbalist on phone: Not on file    Gets together: Not on file    Attends religious service: Not on file    Active member of club or organization: Not on file    Attends meetings of clubs or organizations: Not on file    Relationship status: Not on file  . Intimate partner violence    Fear of current or ex partner: Not on file    Emotionally abused: Not on file    Physically abused: Not on file    Forced sexual activity: Not on file  Other Topics Concern  . Not on file  Social History Narrative  . Not on file     BP (!) 98/48   Pulse (!) 57   Temp 97.8 F (36.6 C)   Ht 5\' 8"  (1.727 m)   Wt 161 lb (73 kg)   SpO2 95%   BMI 24.48 kg/m   Physical Exam:  Well appearing NAD HEENT: Unremarkable Neck:  No JVD, no thyromegally Lymphatics:  No adenopathy Back:  No CVA tenderness Lungs:  Clear with no wheezes HEART:  Regular rate rhythm, no murmurs, no rubs, no clicks Abd:  soft, positive bowel sounds, no organomegally, no rebound, no guarding Ext:  2 plus pulses, no edema, no cyanosis, no clubbing Skin:  No rashes no nodules Neuro:  CN II through XII intact, motor grossly intact  EKG - NSR with no acute STT changes  Assess/Plan: 1. Crescendo angina - he thinks that his symptoms have worsened over the past 2 weeks. We discussed the treatment options. His BP is low and HR is controlled so we do not have much option for medical uptitration. Ranolazine is an option but I doubt effectiveness. I will ask to Dr. Tamala Julian to consider rotational atherectomy.  2. PAF - he appears to be maintaining NSR. I do  not have the impression that he is having a lot of break through atrial fib on amiodarone. 3. Dyspnea - he has preserved LV function and I suspect his dyspnea a consequence of ischemia. He is not coughing.  I spent 25 minutes including 50% face to face with this patient.  Christopher [name].D.

## 2019-08-05 NOTE — Patient Instructions (Signed)
Medication Instructions:  Your physician recommends that you continue on your current medications as directed. Please refer to the Current Medication list given to you today.  If you need a refill on your cardiac medications before your next appointment, please call your pharmacy.   Lab work: NONE  If you have labs (blood work) drawn today and your tests are completely normal, you will receive your results only by: [name] MyChart Message (if you have MyChart) OR . A paper copy in the mail If you have any lab test that is abnormal or we need to change your treatment, we will call you to review the results.  Testing/Procedures: NONE   Follow-Up: At [facility], you and your health needs are our priority.  As part of our continuing mission to provide you with exceptional heart care, we have created designated Provider Care Teams.  These Care Teams include your primary Cardiologist (physician) and Advanced Practice Providers (APPs -  Physician Assistants and Nurse Practitioners) who all work together to provide you with the care you need, when you need it. You will need a follow up appointment With Dr. Tamala Julian. Please call our office 2 months in advance to schedule this appointment.  You may see .or one of the following Advanced Practice Providers on your designated Care Team:   Chanetta Marshall, NP . Tommye Standard, PA-C  Any Other Special Instructions Will Be Listed Below (If Applicable). Thank you for choosing [facility]!

## 2019-08-10 ENCOUNTER — Ambulatory Visit (INDEPENDENT_AMBULATORY_CARE_PROVIDER_SITE_OTHER): Payer: Medicare Other | Admitting: Internal Medicine

## 2019-08-12 DIAGNOSIS — G4700 Insomnia, unspecified: Secondary | ICD-10-CM | POA: Diagnosis not present

## 2019-08-12 DIAGNOSIS — S61401S Unspecified open wound of right hand, sequela: Secondary | ICD-10-CM | POA: Diagnosis not present

## 2019-08-12 DIAGNOSIS — G894 Chronic pain syndrome: Secondary | ICD-10-CM | POA: Diagnosis not present

## 2019-08-13 ENCOUNTER — Ambulatory Visit: Payer: Medicare Other | Admitting: Internal Medicine

## 2019-08-17 ENCOUNTER — Ambulatory Visit (INDEPENDENT_AMBULATORY_CARE_PROVIDER_SITE_OTHER): Payer: Medicare Other | Admitting: Internal Medicine

## 2019-08-18 ENCOUNTER — Encounter (INDEPENDENT_AMBULATORY_CARE_PROVIDER_SITE_OTHER): Payer: Self-pay | Admitting: Internal Medicine

## 2019-08-18 ENCOUNTER — Ambulatory Visit (INDEPENDENT_AMBULATORY_CARE_PROVIDER_SITE_OTHER): Payer: Medicare Other | Admitting: Internal Medicine

## 2019-08-18 ENCOUNTER — Other Ambulatory Visit: Payer: Self-pay

## 2019-08-18 VITALS — BP 109/69 | HR 47 | Temp 97.6°F | Ht 68.0 in | Wt 156.2 lb

## 2019-08-18 DIAGNOSIS — K2270 Barrett's esophagus without dysplasia: Secondary | ICD-10-CM

## 2019-08-18 DIAGNOSIS — K219 Gastro-esophageal reflux disease without esophagitis: Secondary | ICD-10-CM | POA: Diagnosis not present

## 2019-08-18 DIAGNOSIS — R140 Abdominal distension (gaseous): Secondary | ICD-10-CM | POA: Diagnosis not present

## 2019-08-18 MED ORDER — SIMETHICONE 180 MG PO CAPS: 180.0000 mg | ORAL_CAPSULE | Freq: Three times a day (TID) | ORAL | 0 refills | Status: DC | PRN

## 2019-08-18 NOTE — Progress Notes (Signed)
Presenting complaint;  Follow for chronic GERD. Patient complains of bloating.  Database and subjective:  Patient is 73 year old Caucasian male who is here for scheduled visit.  He has a history of chronic GERD complicated by short segment Barrett's esophagus as well as history of colonic adenoma and sessile serrated polyp.  He underwent surveillance EGD and colonoscopy on 05/30/2018. EGD revealed short segment Barrett's esophagus and mild nonbleeding gastric antral vascular ectasia.  Biopsy biopsy however was negative for Barrett's which may be due to sampling error. Colonoscopy revealed sigmoid diverticulosis and external hemorrhoids but no polyps.  Next colonoscopy was recommended in 7 years.  Patient states he has done well as far as his GERD symptoms are concerned.  He seldom has heartburn.  However he had a bad episode 3 weeks ago after he ate steak and put a lot of A1 sauce on it.  He had to sleep in a recliner.  He was fine the next day.  He denies nausea vomiting dysphagia or throat symptoms.  He has lost 8 pounds since his last visit of April 2019.  He states weight loss is because of dietary changes on account of coronary artery disease.  He has triple-vessel disease.  He has frequent if not daily chest pain.  He also has exertional dyspnea.  He has an appointment to see his cardiologist Dr. Daneen Schick next week to review treatment options. His new complaint today is 1 of bloating.  He states it started about a year ago but is been getting worse.  He has it after every meal.  He feels the best when he wakes up in the morning.  He does not have any pain.  He denies melena or rectal bleeding.  He has chronic low back pain as well as pain in his knees and shoulders. He is not having any side effects with PPI which he takes twice daily.   Current Medications: Outpatient Encounter Medications as of 08/18/2019  Medication Sig  . ALPRAZolam (XANAX) 1 MG tablet Take 1 mg by mouth at bedtime.  [name]  amiodarone (PACERONE) 200 MG tablet Take 1 tablet (200 mg total) by mouth daily.  [name] apixaban (ELIQUIS) 5 MG TABS tablet Take 1 tablet (5 mg total) by mouth 2 (two) times daily.  [name] aspirin EC 81 MG tablet Take 1 tablet (81 mg total) by mouth daily.  [name] atorvastatin (LIPITOR) 80 MG tablet Take 1 tablet (80 mg total) by mouth daily.  . Calcium Polycarbophil (FIBER-CAPS PO) Take 2 capsules by mouth daily.  . carvedilol (COREG) 3.125 MG tablet Take 1 tablet (3.125 mg total) by mouth 2 (two) times daily with a meal.  . Cholecalciferol (VITAMIN D3) 10 MCG (400 UNIT) CAPS Take 400 Units by mouth daily.  [name] docusate sodium (COLACE) 250 MG capsule Take 250 mg by mouth daily.  . finasteride (PROSCAR) 5 MG tablet Take 5 mg by mouth daily.  . furosemide (LASIX) 20 MG tablet Take 1 tablet (20 mg total) by mouth daily.  [name] gabapentin (NEURONTIN) 400 MG capsule One three times a day (Patient taking differently: Take 400 mg by mouth 3 (three) times daily. One three times a da)  . HYDROcodone-acetaminophen (NORCO) 10-325 MG tablet Take 1 tablet by mouth every 8 (eight) hours as needed for moderate pain.   . isosorbide mononitrate (IMDUR) 30 MG 24 hr tablet Take 1 tablet (30 mg total) by mouth daily.  . nitroGLYCERIN (NITROSTAT) 0.4 MG SL tablet Place 1 tablet (0.4 mg total) under  the tongue every 5 (five) minutes as needed for chest pain.  . pantoprazole (PROTONIX) 40 MG tablet TAKE 1 TABLET BY MOUTH TWICE A DAY (30 TO 60 MINUTES BEFORE 1ST AND LAST MEALS OF THE DAY)  . tamsulosin (FLOMAX) 0.4 MG CAPS capsule Take 0.4 mg by mouth at bedtime.  . vitamin E 200 UNIT capsule Take 200 Units by mouth daily.  . [DISCONTINUED] cephALEXin (KEFLEX) 500 MG capsule Take 1 capsule (500 mg total) by mouth 4 (four) times daily. (Patient not taking: Reported on 08/18/2019)   No facility-administered encounter medications on file as of 08/18/2019.      Objective: Blood pressure 109/69, pulse (!) 47, temperature 97.6 F (36.4 C),  temperature source Oral, height 5\' 8"  (1.727 m), weight 156 lb 3.2 oz (70.9 kg). Patient is alert and in no acute distress. He is using a facemask. Conjunctiva is pink. Sclera is nonicteric Oropharyngeal mucosa is normal. He has upper and lower dentures in place. No neck masses or thyromegaly noted. Cardiac exam with regular rhythm normal S1 and S2. No murmur or gallop noted. Lungs are clear to auscultation. Abdomen is somewhat protuberant.  Bowel sounds are hyperactive.  No bruits noted.  On palpation abdomen is soft and nontender with organomegaly or masses. No LE edema or clubbing noted.   Assessment:  #1.  Chronic GERD complicated by short segment Barrett's esophagus.  Last EGD was in June 2019 and biopsy actually was negative for Barrett's.  His symptoms are well controlled with therapy.  He is requiring double dose PPI for symptom control.  Will leave him on current therapy while he is having ongoing issues with coronary artery disease.  May consider dropping dose at some point in future.  #2.  Bloating which appears to be quite bothersome to the patient.  Abdominal exam is unremarkable.  It is negative for ascites.  Need to rule out biliary tract disease.  He could also have intestinal bacterial overgrowth.  He is not having postprandial pain and therefore abdominal angina unlikely.  Plan:  Continue antireflux measures and pantoprazole at a dose of 40 mg p.o. twice daily. Phazyme 180 mg after each meal for 1 week and thereafter on as-needed basis if it provides some relief. Upper abdominal ultrasound. Office visit in 1 year.

## 2019-08-18 NOTE — Patient Instructions (Signed)
Take phazyme 180 after each meal for one week and then as needed if it helps with bloating.

## 2019-08-26 ENCOUNTER — Other Ambulatory Visit: Payer: Self-pay

## 2019-08-26 ENCOUNTER — Encounter: Payer: Self-pay | Admitting: Interventional Cardiology

## 2019-08-26 ENCOUNTER — Ambulatory Visit (INDEPENDENT_AMBULATORY_CARE_PROVIDER_SITE_OTHER): Payer: Medicare Other | Admitting: Interventional Cardiology

## 2019-08-26 VITALS — BP 112/64 | HR 45 | Ht 68.0 in | Wt 157.4 lb

## 2019-08-26 DIAGNOSIS — Z7189 Other specified counseling: Secondary | ICD-10-CM | POA: Diagnosis not present

## 2019-08-26 DIAGNOSIS — I480 Paroxysmal atrial fibrillation: Secondary | ICD-10-CM | POA: Diagnosis not present

## 2019-08-26 DIAGNOSIS — E785 Hyperlipidemia, unspecified: Secondary | ICD-10-CM

## 2019-08-26 DIAGNOSIS — M5416 Radiculopathy, lumbar region: Secondary | ICD-10-CM | POA: Diagnosis not present

## 2019-08-26 DIAGNOSIS — I25119 Atherosclerotic heart disease of native coronary artery with unspecified angina pectoris: Secondary | ICD-10-CM

## 2019-08-26 DIAGNOSIS — M961 Postlaminectomy syndrome, not elsewhere classified: Secondary | ICD-10-CM | POA: Diagnosis not present

## 2019-08-26 DIAGNOSIS — R0602 Shortness of breath: Secondary | ICD-10-CM

## 2019-08-26 DIAGNOSIS — Z9689 Presence of other specified functional implants: Secondary | ICD-10-CM | POA: Diagnosis not present

## 2019-08-26 MED ORDER — NITROGLYCERIN 0.4 MG SL SUBL: 0.4000 mg | SUBLINGUAL_TABLET | SUBLINGUAL | 5 refills | Status: AC | PRN

## 2019-08-26 MED ORDER — ISOSORBIDE MONONITRATE ER 60 MG PO TB24: 60.0000 mg | ORAL_TABLET | Freq: Every day | ORAL | 3 refills | Status: DC

## 2019-08-26 NOTE — Progress Notes (Signed)
Cardiology Office Note:    Date:  08/26/2019   ID:  WHARTON DITTOE, DOB 05/12/1946, MRN [id number]  PCP:  Celene Squibb, MD  Cardiologist:  Cristopher Peru, MD   Referring MD: Celene Squibb, MD   Chief Complaint  Patient presents with  . Coronary Artery Disease  . Shortness of Breath    History of Present Illness:    Christopher [name] is a 73 y.o. male with a hx of 44-month history of dyspnea on exertion, PAF, CAD with three vessel disease and angina, and HTN.  He is referred by Dr. Beckie Salts because of concern for increasing angina.  Discussed the overall condition.  The patient states that there is a greater than 62-month history of progressive shortness of breath on exertion.  Shortness of breath is not associated with chest pain.  Frequently when he stops and rests or after he sits down he will feel a discomfort in the subclavicular area.  The left subclavicular discomfort has been present for greater than 10 years intermittently.  It now seems to spread across the upper chest into the subclavicular area.  It is mild.  He has never consider taking nitroglycerin under his tongue for the complaint.  He denies significant palpitations.  There is no orthopnea or PND.  He is still quite active.  He just cannot do as much as he used to do greater than 2 to 3 years ago.  He still mows his grass" a lot of work outdoors.  Isosorbide mononitrate 30 mg/day was started after his heart catheterization.  Past Medical History:  Diagnosis Date  . A-fib ([place])   . Arthritis    "all over my body" (06/17/2017)  . BPH (benign prostatic hyperplasia)    takes Proscar daily  . Chronic back pain    DDD; "upper and lower" (06/17/2017)  . Constipation    takes Colace daily  . DDD (degenerative disc disease), cervical   . DVT (deep venous thrombosis) ([facility])    left peroneal vein 07/2017 (in setting of recent left TKA)  . Dysrhythmia    history of Atrial fibrillation, no long has this  . Enlarged prostate   . GERD  (gastroesophageal reflux disease)    takes Nexium daily  . H/O hiatal hernia   . History of colon polyps    benign  . History of gastric ulcer   . Insomnia    takes Xanax nightly  . Joint capsule tear    left knee  . Joint pain   . Joint swelling   . Macular degeneration   . Muscle spasm of back    takes Valium daily as needed  . PAF (paroxysmal atrial fibrillation) ([place])   . Peripheral neuropathy    takes Gabapentin daily  . PONV (postoperative nausea and vomiting)   . Sleep apnea    pt does not wear a cpap  . Urinary hesitancy   . Weakness    numbness and tingling    Past Surgical History:  Procedure Laterality Date  . ANTERIOR CERVICAL DECOMP/DISCECTOMY FUSION  ~ 1991   "took bone from hip"  . ANTERIOR CERVICAL DECOMP/DISCECTOMY FUSION  X 2   "put plate in"  . ANTERIOR CERVICAL DISCECTOMY  <11/2005    5-6 and 4-5./notes 05/02/2011  . APPENDECTOMY    . BACK SURGERY    . BIOPSY  05/30/2018   Procedure: BIOPSY;  Surgeon: [name], MD;  [facility];  Service: Endoscopy;;  esophagus  . CARDIAC CATHETERIZATION    . CARPAL TUNNEL RELEASE Bilateral 10/2005 - 11/2005   right-left [facility] 05/02/2011  . CATARACT EXTRACTION W/PHACO Right 05/24/2014   Procedure: CATARACT EXTRACTION PHACO AND INTRAOCULAR LENS PLACEMENT (IOC);  Surgeon: [name], MD;  Location: [facility];  Service: Ophthalmology;  Laterality: Right;  CDE 6.74  . CATARACT EXTRACTION W/PHACO Left 06/17/2014   Procedure: CATARACT EXTRACTION PHACO AND INTRAOCULAR LENS PLACEMENT (IOC);  Surgeon: [name], MD;  Location: [facility];  Service: Ophthalmology;  Laterality: Left;  CDE:4.65  . COLONOSCOPY WITH ESOPHAGOGASTRODUODENOSCOPY (EGD) N/A 01/22/2013   Procedure: COLONOSCOPY WITH ESOPHAGOGASTRODUODENOSCOPY (EGD);  Surgeon: [name], MD;  [facility];  Service: Endoscopy;  Laterality: N/A;  140  . COLONOSCOPY WITH PROPOFOL N/A 05/30/2018   Procedure: COLONOSCOPY WITH PROPOFOL;  Surgeon: [name], MD;  [facility];  Service: Endoscopy;  Laterality: N/A;  . ESOPHAGOGASTRODUODENOSCOPY (EGD) WITH PROPOFOL N/A 05/30/2018   Procedure: ESOPHAGOGASTRODUODENOSCOPY (EGD) WITH PROPOFOL;  Surgeon: [name], MD;  [facility];  Service: Endoscopy;  Laterality: N/A;  . EYE SURGERY Bilateral    "cleaned film w/laser; since my 1st cataract OR"  . JOINT REPLACEMENT    . LEFT HEART CATH AND CORONARY ANGIOGRAPHY N/A 02/16/2019   Procedure: LEFT HEART CATH AND CORONARY ANGIOGRAPHY;  Surgeon: Belva Crome, MD;  Location: [facility];  Service: Cardiovascular;  Laterality: N/A;  . POSTERIOR LUMBAR FUSION     "put a plate in"  . QUADRICEPS TENDON REPAIR Left 07/01/2017  . QUADRICEPS TENDON REPAIR Left 07/01/2017   Procedure: REPAIR QUADRICEP TENDON;  Surgeon: [name], MD;  Location: [facility];  Service: Orthopedics;  Laterality: Left;  . SHOULDER ARTHROSCOPY Bilateral   . SPINAL CORD STIMULATOR INSERTION N/A 09/23/2015   Procedure: LUMBAR SPINAL CORD STIMULATOR INSERTION;  Surgeon: [name], MD;  Location: [facility];  Service: Neurosurgery;  Laterality: N/A;  LUMBAR SPINAL CORD STIMULATOR INSERTION  . TOTAL KNEE ARTHROPLASTY Left 06/17/2017  . TOTAL KNEE ARTHROPLASTY Left 06/17/2017   Procedure: TOTAL KNEE ARTHROPLASTY;  Surgeon: [name], MD;  Location: [facility];  Service: Orthopedics;  Laterality: Left;  . TOTAL KNEE ARTHROPLASTY Right 12/16/2017   Procedure: TOTAL KNEE ARTHROPLASTY;  Surgeon: [name], MD;  Location: [facility];  Service: Orthopedics;  Laterality: Right;    Current Medications: Current Meds  Medication Sig  . ALPRAZolam (XANAX) 1 MG tablet Take 1 mg by mouth at bedtime.  [name] amiodarone (PACERONE) 200 MG tablet Take 1 tablet (200 mg total) by mouth daily.  [name] apixaban (ELIQUIS) 5 MG TABS tablet Take 1 tablet (5 mg total) by mouth 2 (two) times daily.  [name] aspirin EC 81 MG tablet Take 1 tablet (81 mg total) by mouth daily.  [name] atorvastatin  (LIPITOR) 80 MG tablet Take 1 tablet (80 mg total) by mouth daily.  . Calcium Polycarbophil (FIBER-CAPS PO) Take 2 capsules by mouth daily.  . carvedilol (COREG) 3.125 MG tablet Take 1 tablet (3.125 mg total) by mouth 2 (two) times daily with a meal.  . Cholecalciferol (VITAMIN D3) 10 MCG (400 UNIT) CAPS Take 400 Units by mouth daily.  [name] docusate sodium (COLACE) 250 MG capsule Take 250 mg by mouth daily.  . finasteride (PROSCAR) 5 MG tablet Take 5 mg by mouth daily.  . furosemide (LASIX) 20 MG tablet Take 1 tablet (20 mg total) by mouth daily.  [name] gabapentin (NEURONTIN) 400 MG capsule One three times a  day  . HYDROcodone-acetaminophen (NORCO) 10-325 MG tablet Take 1 tablet by mouth every 8 (eight) hours as needed for moderate pain.   . metoprolol tartrate (LOPRESSOR) 25 MG tablet Take 25 mg by mouth 2 (two) times daily.  . nitroGLYCERIN (NITROSTAT) 0.4 MG SL tablet Place 1 tablet (0.4 mg total) under the tongue every 5 (five) minutes as needed for chest pain.  . pantoprazole (PROTONIX) 40 MG tablet TAKE 1 TABLET BY MOUTH TWICE A DAY (30 TO 60 MINUTES BEFORE 1ST AND LAST MEALS OF THE DAY)  . silodosin (RAPAFLO) 8 MG CAPS capsule Take 8 mg by mouth daily.  . Simethicone 180 MG CAPS Take 1 capsule (180 mg total) by mouth 3 (three) times daily as needed.  . tamsulosin (FLOMAX) 0.4 MG CAPS capsule Take 0.4 mg by mouth at bedtime.  . vitamin E 200 UNIT capsule Take 200 Units by mouth daily.  . [DISCONTINUED] isosorbide mononitrate (IMDUR) 30 MG 24 hr tablet Take 1 tablet (30 mg total) by mouth daily.  . [DISCONTINUED] nitroGLYCERIN (NITROSTAT) 0.4 MG SL tablet Place 1 tablet (0.4 mg total) under the tongue every 5 (five) minutes as needed for chest pain.     Allergies:   Morphine, Morphine and related, and Propofol   Social History   Socioeconomic History  . Marital status: Single    Spouse name: Not on file  . Number of children: Not on file  . Years of education: Not on file  . Highest  education level: Not on file  Occupational History  . Not on file  Social Needs  . Financial resource strain: Not on file  . Food insecurity    Worry: Not on file    Inability: Not on file  . Transportation needs    Medical: Not on file    Non-medical: Not on file  Tobacco Use  . Smoking status: Former Smoker    Packs/day: 1.00    Years: 30.00    Pack years: 30.00    Types: Cigarettes    Quit date: 01/14/1987    Years since quitting: 32.6  . Smokeless tobacco: Former Systems developer    Types: Chew  . Tobacco comment: quit smoking  & chewing by 1987  Substance and Sexual Activity  . Alcohol use: Yes    Alcohol/week: 15.0 standard drinks    Types: 3 Glasses of wine, 12 Cans of beer per week    Comment: 2-3 times weekly   . Drug use: No  . Sexual activity: Not Currently  Lifestyle  . Physical activity    Days per week: Not on file    Minutes per session: Not on file  . Stress: Not on file  Relationships  . Social Herbalist on phone: Not on file    Gets together: Not on file    Attends religious service: Not on file    Active member of club or organization: Not on file    Attends meetings of clubs or organizations: Not on file    Relationship status: Not on file  Other Topics Concern  . Not on file  Social History Narrative  . Not on file     Family History: The patient's family history is not on file.  ROS:   Please see the history of present illness.    He does not smoke.  He sleeps well.  No peripheral edema.  Smoked for 30 years.  Has not smoked in 30 years.  All other systems reviewed  and are negative.  EKGs/Labs/Other Studies Reviewed:    The following studies were reviewed today: Coronary Angiography 02/2019:    EKG:  EKG sinus bradycardia at 45 bpm.  Incomplete right bundle with first-degree AV block.  Compared to the prior tracing from February 2020, the heart rate is significantly slower.  Recent Labs: 12/15/2018: Pro B Natriuretic peptide (BNP)  70.0; TSH 1.93 02/11/2019: BUN 10; Creatinine, Ser 0.82; Hemoglobin 13.2; Platelets 343; Potassium 4.1; Sodium 145  Recent Lipid Panel    Component Value Date/Time   CHOL 193 11/04/2017 1120   TRIG 72 11/04/2017 1120   HDL 52 11/04/2017 1120   CHOLHDL 3.7 11/04/2017 1120   VLDL 14 11/04/2017 1120   LDLCALC 127 (H) 11/04/2017 1120    Physical Exam:    VS:  BP 112/64   Pulse (!) 45   Ht 5\' 8"  (1.727 m)   Wt 157 lb 6.4 oz (71.4 kg)   SpO2 95%   BMI 23.93 kg/m     Wt Readings from Last 3 Encounters:  08/26/19 157 lb 6.4 oz (71.4 kg)  08/18/19 156 lb 3.2 oz (70.9 kg)  08/05/19 161 lb (73 kg)     GEN: Relatively healthy-appearing.  Appearance is compatible with age.. No acute distress HEENT: Normal NECK: No JVD. LYMPHATICS: No lymphadenopathy CARDIAC:  RRR without murmur, gallop, or edema. VASCULAR:  Normal Pulses. No bruits. RESPIRATORY:  Clear to auscultation without rales, wheezing or rhonchi  ABDOMEN: Soft, non-tender, non-distended, No pulsatile mass, MUSCULOSKELETAL: No deformity  SKIN: Warm and dry NEUROLOGIC:  Alert and oriented x 3 PSYCHIATRIC:  Normal affect   ASSESSMENT:    1. Coronary artery disease involving native coronary artery of native heart with angina pectoris ([place])   2. PAF (paroxysmal atrial fibrillation) ([place])   3. Hyperlipidemia, unspecified hyperlipidemia type   4. SOB (shortness of breath)   5. Educated About Covid-19 Virus Infection    PLAN:    In order of problems listed above:  1. It is really difficult for me to discern if dyspnea on exertion is ischemia related.  There is no chest discomfort with exertion.  Rather when he stops to rest or sits down he gets left subclavicular discomfort that spreads across the upper precordium.  The discomfort has never caused him to be suspicious that it is related to coronary disease and therefore he has not used sublingual nitroglycerin.  We spent significant time reviewing his coronary angiogram from  February.  He has terrible calcified coronary disease with 60 to 75% proximal and 75 to 80% mid disease.  The vessels are relatively small in caliber due to diffuse plaque buildup.  We discussed the possibility of performing PCI and including a higher risk profile for ischemic complications.  Likely less than 2%.  Slightly higher risk of death and stroke.  Higher risk of bleeding.  We have increased isosorbide mononitrate to 60 mg/day.  This is essentially maximizing his anti-ischemic coverage.  If he is still symptomatic without improvement in dyspnea I have recommended that we go ahead and perform orbital atherectomy with stenting of the LAD as an elective procedure.  He will let me know how he feels about this after we discussed his status in 7 to 10 days over the phone. 2. Currently in significant sinus bradycardia today.  His resting heart rate is 45 and I wonder if some of his exertional fatigue and dyspnea could be related to chronotropic incompetence induced by medications. 3. Statin therapy is maximized.  Last LDL was 127 in November 2018.  There have to be additional labs.  Unavailable in: Records. 4. Shortness of breath as mentioned above could be an ischemic equivalent or due to latent development of emphysema from prior significant smoking history.  At the time of coronary angiography and March 2020, LVEDP was normal. 5. Social distancing, mask wearing, and handwashing is encouraged.  Overall education and awareness concerning primary/secondary risk prevention was discussed in detail: LDL less than 70, hemoglobin A1c less than 7, blood pressure target less than 130/80 mmHg, >150 minutes of moderate aerobic activity per week, avoidance of smoking, weight control (via diet and exercise), and continued surveillance/management of/for obstructive sleep apnea.    Medication Adjustments/Labs and Tests Ordered: Current medicines are reviewed at length with the patient today.  Concerns regarding  medicines are outlined above.  Orders Placed This Encounter  Procedures  . EKG 12-Lead   Meds ordered this encounter  Medications  . isosorbide mononitrate (IMDUR) 60 MG 24 hr tablet    Sig: Take 1 tablet (60 mg total) by mouth daily.    Dispense:  90 tablet    Refill:  3  . nitroGLYCERIN (NITROSTAT) 0.4 MG SL tablet    Sig: Place 1 tablet (0.4 mg total) under the tongue every 5 (five) minutes as needed for chest pain.    Dispense:  25 tablet    Refill:  5    Patient Instructions  Medication Instructions:  1) INCREASE Isosorbide to 60mg  once daily  If you need a refill on your cardiac medications before your next appointment, please call your pharmacy.   Lab work: None If you have labs (blood work) drawn today and your tests are completely normal, you will receive your results only by: [name] MyChart Message (if you have MyChart) OR . A paper copy in the mail If you have any lab test that is abnormal or we need to change your treatment, we will call you to review the results.  Testing/Procedures: None  Follow-Up: Will be based on your response to the increased dose of medication.  Any Other Special Instructions Will Be Listed Below (If Applicable).       Signed, Sinclair Grooms, MD  08/26/2019 5:05 PM    [place]

## 2019-08-26 NOTE — Patient Instructions (Signed)
Medication Instructions:  1) INCREASE Isosorbide to 60mg  once daily  If you need a refill on your cardiac medications before your next appointment, please call your pharmacy.   Lab work: None If you have labs (blood work) drawn today and your tests are completely normal, you will receive your results only by: [name] MyChart Message (if you have MyChart) OR . A paper copy in the mail If you have any lab test that is abnormal or we need to change your treatment, we will call you to review the results.  Testing/Procedures: None  Follow-Up: Will be based on your response to the increased dose of medication.  Any Other Special Instructions Will Be Listed Below (If Applicable).

## 2019-09-01 ENCOUNTER — Ambulatory Visit (HOSPITAL_COMMUNITY)
Admission: RE | Admit: 2019-09-01 | Discharge: 2019-09-01 | Disposition: A | Discharge status: Home / Self Care | Payer: Medicare Other | Source: Ambulatory Visit | Attending: Internal Medicine | Admitting: Internal Medicine

## 2019-09-01 ENCOUNTER — Other Ambulatory Visit: Payer: Self-pay

## 2019-09-01 DIAGNOSIS — R140 Abdominal distension (gaseous): Secondary | ICD-10-CM | POA: Diagnosis not present

## 2019-09-01 DIAGNOSIS — K8080 Other cholelithiasis without obstruction: Secondary | ICD-10-CM | POA: Diagnosis not present

## 2019-09-02 ENCOUNTER — Telehealth: Payer: Self-pay | Admitting: Interventional Cardiology

## 2019-09-02 NOTE — Telephone Encounter (Signed)
New Message  Pt c/o medication issue:  1. Name of Medication: isosorbide mononitrate (IMDUR) 60 MG 24 hr tablet  2. How are you currently taking this medication (dosage and times per day)?  Take 1 tablet (60 mg total) by mouth daily.  3. Are you having a reaction (difficulty breathing--STAT)? No  4. What is your medication issue? Patient states that he was told to call in once he has been taking the medication for a week. States that medication is helping, but he is still having chest pain that comes and goes, not currently having chest pain. Please give patient a call back to discuss.

## 2019-09-02 NOTE — Telephone Encounter (Signed)
Spoke with pt and he states chest discomfort and SOB are still present but have improved since increasing Imdur.  Neither are occurring as often as they had been. Pt use to have to stop to rest when climbing his stairs.  Went up the stairs yesterday and did not have to take a break but was a little winded when he got to the top.  This resolved quickly.  Pt went up the steps today and no issues at all.  Pt did mention that he woke up at 1AM with an episode of chest discomfort that was the worse he's ever had.  Didn't last long and no other sx.  Advised I will send to Dr. Tamala Julian for review.

## 2019-09-03 NOTE — Telephone Encounter (Signed)
Spoke with pt and he would like to stick to the medication for now.  He is hesitant to do the procedure.  Advised if pain or discomfort worsens or starts to occur more often, contact the office and we would discuss cath again.  Pt agreeable with plan.  Will route to Dr. Tamala Julian to see if any further recommendations.

## 2019-09-03 NOTE — Telephone Encounter (Signed)
Does he feel we should proceed with atherectomy or stay with medications for the time being?

## 2019-09-05 NOTE — Telephone Encounter (Signed)
Thanks

## 2019-09-09 DIAGNOSIS — I2510 Atherosclerotic heart disease of native coronary artery without angina pectoris: Secondary | ICD-10-CM | POA: Diagnosis not present

## 2019-09-10 DIAGNOSIS — D519 Vitamin B12 deficiency anemia, unspecified: Secondary | ICD-10-CM | POA: Diagnosis not present

## 2019-09-10 DIAGNOSIS — E559 Vitamin D deficiency, unspecified: Secondary | ICD-10-CM | POA: Diagnosis not present

## 2019-09-10 DIAGNOSIS — E782 Mixed hyperlipidemia: Secondary | ICD-10-CM | POA: Diagnosis not present

## 2019-09-10 DIAGNOSIS — K219 Gastro-esophageal reflux disease without esophagitis: Secondary | ICD-10-CM | POA: Diagnosis not present

## 2019-09-10 DIAGNOSIS — I4891 Unspecified atrial fibrillation: Secondary | ICD-10-CM | POA: Diagnosis not present

## 2019-09-16 DIAGNOSIS — E785 Hyperlipidemia, unspecified: Secondary | ICD-10-CM | POA: Diagnosis not present

## 2019-09-16 DIAGNOSIS — Z7901 Long term (current) use of anticoagulants: Secondary | ICD-10-CM | POA: Diagnosis not present

## 2019-09-16 DIAGNOSIS — I480 Paroxysmal atrial fibrillation: Secondary | ICD-10-CM | POA: Diagnosis not present

## 2019-09-16 DIAGNOSIS — I25118 Atherosclerotic heart disease of native coronary artery with other forms of angina pectoris: Secondary | ICD-10-CM | POA: Diagnosis not present

## 2019-09-16 DIAGNOSIS — Z87891 Personal history of nicotine dependence: Secondary | ICD-10-CM | POA: Diagnosis not present

## 2019-09-16 DIAGNOSIS — K219 Gastro-esophageal reflux disease without esophagitis: Secondary | ICD-10-CM | POA: Diagnosis not present

## 2019-09-17 DIAGNOSIS — E559 Vitamin D deficiency, unspecified: Secondary | ICD-10-CM | POA: Diagnosis not present

## 2019-09-17 DIAGNOSIS — D509 Iron deficiency anemia, unspecified: Secondary | ICD-10-CM | POA: Diagnosis not present

## 2019-09-17 DIAGNOSIS — R7301 Impaired fasting glucose: Secondary | ICD-10-CM | POA: Diagnosis not present

## 2019-09-17 DIAGNOSIS — E782 Mixed hyperlipidemia: Secondary | ICD-10-CM | POA: Diagnosis not present

## 2019-09-17 DIAGNOSIS — D519 Vitamin B12 deficiency anemia, unspecified: Secondary | ICD-10-CM | POA: Diagnosis not present

## 2019-09-18 DIAGNOSIS — I2510 Atherosclerotic heart disease of native coronary artery without angina pectoris: Secondary | ICD-10-CM | POA: Diagnosis not present

## 2019-09-18 DIAGNOSIS — Z01812 Encounter for preprocedural laboratory examination: Secondary | ICD-10-CM | POA: Diagnosis not present

## 2019-09-21 DIAGNOSIS — Z01818 Encounter for other preprocedural examination: Secondary | ICD-10-CM | POA: Diagnosis not present

## 2019-09-21 DIAGNOSIS — E785 Hyperlipidemia, unspecified: Secondary | ICD-10-CM | POA: Diagnosis not present

## 2019-09-21 DIAGNOSIS — Z7982 Long term (current) use of aspirin: Secondary | ICD-10-CM | POA: Diagnosis not present

## 2019-09-21 DIAGNOSIS — G4700 Insomnia, unspecified: Secondary | ICD-10-CM | POA: Diagnosis not present

## 2019-09-21 DIAGNOSIS — E559 Vitamin D deficiency, unspecified: Secondary | ICD-10-CM | POA: Diagnosis not present

## 2019-09-21 DIAGNOSIS — E782 Mixed hyperlipidemia: Secondary | ICD-10-CM | POA: Diagnosis not present

## 2019-09-21 DIAGNOSIS — Z951 Presence of aortocoronary bypass graft: Secondary | ICD-10-CM | POA: Diagnosis not present

## 2019-09-21 DIAGNOSIS — G4739 Other sleep apnea: Secondary | ICD-10-CM | POA: Diagnosis not present

## 2019-09-21 DIAGNOSIS — I10 Essential (primary) hypertension: Secondary | ICD-10-CM | POA: Diagnosis not present

## 2019-09-21 DIAGNOSIS — R918 Other nonspecific abnormal finding of lung field: Secondary | ICD-10-CM | POA: Diagnosis not present

## 2019-09-21 DIAGNOSIS — I480 Paroxysmal atrial fibrillation: Secondary | ICD-10-CM | POA: Diagnosis not present

## 2019-09-21 DIAGNOSIS — I2510 Atherosclerotic heart disease of native coronary artery without angina pectoris: Secondary | ICD-10-CM | POA: Diagnosis not present

## 2019-09-21 DIAGNOSIS — J9811 Atelectasis: Secondary | ICD-10-CM | POA: Diagnosis not present

## 2019-09-21 DIAGNOSIS — R0681 Apnea, not elsewhere classified: Secondary | ICD-10-CM | POA: Diagnosis not present

## 2019-09-21 DIAGNOSIS — I959 Hypotension, unspecified: Secondary | ICD-10-CM | POA: Diagnosis not present

## 2019-09-21 DIAGNOSIS — R0683 Snoring: Secondary | ICD-10-CM | POA: Diagnosis not present

## 2019-09-21 DIAGNOSIS — Z791 Long term (current) use of non-steroidal anti-inflammatories (NSAID): Secondary | ICD-10-CM | POA: Diagnosis not present

## 2019-09-21 DIAGNOSIS — K219 Gastro-esophageal reflux disease without esophagitis: Secondary | ICD-10-CM | POA: Diagnosis not present

## 2019-09-21 DIAGNOSIS — Z7901 Long term (current) use of anticoagulants: Secondary | ICD-10-CM | POA: Diagnosis not present

## 2019-09-21 DIAGNOSIS — J9601 Acute respiratory failure with hypoxia: Secondary | ICD-10-CM | POA: Diagnosis not present

## 2019-09-21 DIAGNOSIS — R110 Nausea: Secondary | ICD-10-CM | POA: Diagnosis not present

## 2019-09-21 DIAGNOSIS — R001 Bradycardia, unspecified: Secondary | ICD-10-CM | POA: Diagnosis not present

## 2019-09-21 DIAGNOSIS — E119 Type 2 diabetes mellitus without complications: Secondary | ICD-10-CM | POA: Diagnosis not present

## 2019-09-21 DIAGNOSIS — I340 Nonrheumatic mitral (valve) insufficiency: Secondary | ICD-10-CM | POA: Diagnosis not present

## 2019-09-21 DIAGNOSIS — D62 Acute posthemorrhagic anemia: Secondary | ICD-10-CM | POA: Diagnosis not present

## 2019-09-21 DIAGNOSIS — I236 Thrombosis of atrium, auricular appendage, and ventricle as current complications following acute myocardial infarction: Secondary | ICD-10-CM | POA: Diagnosis not present

## 2019-09-21 DIAGNOSIS — D72829 Elevated white blood cell count, unspecified: Secondary | ICD-10-CM | POA: Diagnosis not present

## 2019-09-21 DIAGNOSIS — Z87891 Personal history of nicotine dependence: Secondary | ICD-10-CM | POA: Diagnosis not present

## 2019-09-21 DIAGNOSIS — I4891 Unspecified atrial fibrillation: Secondary | ICD-10-CM | POA: Diagnosis not present

## 2019-09-21 DIAGNOSIS — Z885 Allergy status to narcotic agent status: Secondary | ICD-10-CM | POA: Diagnosis not present

## 2019-09-21 DIAGNOSIS — I25118 Atherosclerotic heart disease of native coronary artery with other forms of angina pectoris: Secondary | ICD-10-CM | POA: Diagnosis not present

## 2019-09-21 DIAGNOSIS — Z884 Allergy status to anesthetic agent status: Secondary | ICD-10-CM | POA: Diagnosis not present

## 2019-09-21 DIAGNOSIS — J90 Pleural effusion, not elsewhere classified: Secondary | ICD-10-CM | POA: Diagnosis not present

## 2019-09-21 DIAGNOSIS — D519 Vitamin B12 deficiency anemia, unspecified: Secondary | ICD-10-CM | POA: Diagnosis not present

## 2019-09-23 DIAGNOSIS — R0683 Snoring: Secondary | ICD-10-CM | POA: Diagnosis not present

## 2019-09-23 DIAGNOSIS — I10 Essential (primary) hypertension: Secondary | ICD-10-CM | POA: Diagnosis not present

## 2019-09-23 DIAGNOSIS — G4739 Other sleep apnea: Secondary | ICD-10-CM | POA: Diagnosis not present

## 2019-09-23 DIAGNOSIS — I4891 Unspecified atrial fibrillation: Secondary | ICD-10-CM | POA: Diagnosis not present

## 2019-09-23 DIAGNOSIS — D519 Vitamin B12 deficiency anemia, unspecified: Secondary | ICD-10-CM | POA: Diagnosis not present

## 2019-09-23 DIAGNOSIS — K219 Gastro-esophageal reflux disease without esophagitis: Secondary | ICD-10-CM | POA: Diagnosis not present

## 2019-09-23 DIAGNOSIS — E782 Mixed hyperlipidemia: Secondary | ICD-10-CM | POA: Diagnosis not present

## 2019-09-23 DIAGNOSIS — E559 Vitamin D deficiency, unspecified: Secondary | ICD-10-CM | POA: Diagnosis not present

## 2019-09-23 DIAGNOSIS — R0681 Apnea, not elsewhere classified: Secondary | ICD-10-CM | POA: Diagnosis not present

## 2019-09-29 MED ORDER — GENERIC EXTERNAL MEDICATION
37.50 | Status: DC
Start: 2019-09-29 — End: 2019-09-29

## 2019-09-29 MED ORDER — ASPIRIN 81 MG PO CHEW
162.00 | CHEWABLE_TABLET | ORAL | Status: DC
Start: 2019-09-30 — End: 2019-09-29

## 2019-09-29 MED ORDER — GABAPENTIN 400 MG PO CAPS
400.00 | ORAL_CAPSULE | ORAL | Status: DC
Start: 2019-09-29 — End: 2019-09-29

## 2019-09-29 MED ORDER — TAMSULOSIN HCL 0.4 MG PO CAPS
0.40 | ORAL_CAPSULE | ORAL | Status: DC
Start: 2019-09-30 — End: 2019-09-29

## 2019-09-29 MED ORDER — PANTOPRAZOLE SODIUM 40 MG PO TBEC
40.00 | DELAYED_RELEASE_TABLET | ORAL | Status: DC
Start: 2019-09-30 — End: 2019-09-29

## 2019-09-29 MED ORDER — BISACODYL 10 MG RE SUPP
10.00 | RECTAL | Status: DC
Start: ? — End: 2019-09-29

## 2019-09-29 MED ORDER — POLYETHYLENE GLYCOL 3350 17 G PO PACK
17.00 | PACK | ORAL | Status: DC
Start: ? — End: 2019-09-29

## 2019-09-29 MED ORDER — ALBUTEROL SULFATE (2.5 MG/3ML) 0.083% IN NEBU
2.50 | INHALATION_SOLUTION | RESPIRATORY_TRACT | Status: DC
Start: ? — End: 2019-09-29

## 2019-09-29 MED ORDER — ONDANSETRON HCL 4 MG/2ML IJ SOLN
4.00 | INTRAMUSCULAR | Status: DC
Start: ? — End: 2019-09-29

## 2019-09-29 MED ORDER — ATORVASTATIN CALCIUM 40 MG PO TABS
80.00 | ORAL_TABLET | ORAL | Status: DC
Start: 2019-09-29 — End: 2019-09-29

## 2019-09-29 MED ORDER — TORSEMIDE 10 MG PO TABS
10.00 | ORAL_TABLET | ORAL | Status: DC
Start: 2019-09-30 — End: 2019-09-29

## 2019-09-29 MED ORDER — FOLIC ACID 1 MG PO TABS
1.00 | ORAL_TABLET | ORAL | Status: DC
Start: 2019-09-30 — End: 2019-09-29

## 2019-09-29 MED ORDER — SENNOSIDES-DOCUSATE SODIUM 8.6-50 MG PO TABS
2.00 | ORAL_TABLET | ORAL | Status: DC
Start: 2019-09-29 — End: 2019-09-29

## 2019-09-29 MED ORDER — FINASTERIDE 5 MG PO TABS
5.00 | ORAL_TABLET | ORAL | Status: DC
Start: 2019-09-30 — End: 2019-09-29

## 2019-09-29 MED ORDER — SORBITOL 70 % PO SOLN
30.00 | ORAL | Status: DC
Start: ? — End: 2019-09-29

## 2019-09-29 MED ORDER — TRAMADOL HCL 50 MG PO TABS
50.00 | ORAL_TABLET | ORAL | Status: DC
Start: ? — End: 2019-09-29

## 2019-09-29 MED ORDER — GENERIC EXTERNAL MEDICATION
100.00 | Status: DC
Start: 2019-09-30 — End: 2019-09-29

## 2019-09-29 MED ORDER — ALPRAZOLAM 1 MG PO TABS
1.00 | ORAL_TABLET | ORAL | Status: DC
Start: ? — End: 2019-09-29

## 2019-09-29 MED ORDER — APIXABAN 5 MG PO TABS
5.00 | ORAL_TABLET | ORAL | Status: DC
Start: 2019-09-29 — End: 2019-09-29

## 2019-09-29 MED ORDER — PROMETHAZINE HCL 25 MG PO TABS
25.00 | ORAL_TABLET | ORAL | Status: DC
Start: ? — End: 2019-09-29

## 2019-09-29 MED ORDER — AMIODARONE HCL 200 MG PO TABS
200.00 | ORAL_TABLET | ORAL | Status: DC
Start: 2019-09-30 — End: 2019-09-29

## 2019-10-13 ENCOUNTER — Other Ambulatory Visit: Payer: Self-pay | Admitting: Internal Medicine

## 2019-10-13 DIAGNOSIS — R0681 Apnea, not elsewhere classified: Secondary | ICD-10-CM | POA: Diagnosis not present

## 2019-10-13 DIAGNOSIS — R0683 Snoring: Secondary | ICD-10-CM | POA: Diagnosis not present

## 2019-10-13 DIAGNOSIS — Z01812 Encounter for preprocedural laboratory examination: Secondary | ICD-10-CM | POA: Diagnosis not present

## 2019-10-15 DIAGNOSIS — I4891 Unspecified atrial fibrillation: Secondary | ICD-10-CM | POA: Diagnosis not present

## 2019-10-16 ENCOUNTER — Other Ambulatory Visit: Payer: Self-pay

## 2019-10-16 DIAGNOSIS — Z20822 Contact with and (suspected) exposure to covid-19: Secondary | ICD-10-CM

## 2019-10-18 LAB — NOVEL CORONAVIRUS, NAA: SARS-CoV-2, NAA: NOT DETECTED

## 2019-10-20 ENCOUNTER — Other Ambulatory Visit: Payer: Self-pay | Admitting: Internal Medicine

## 2019-10-20 DIAGNOSIS — G4733 Obstructive sleep apnea (adult) (pediatric): Secondary | ICD-10-CM | POA: Diagnosis not present

## 2019-10-20 MED ORDER — PANTOPRAZOLE SODIUM 40 MG PO TBEC: DELAYED_RELEASE_TABLET | ORAL | 0 refills | Status: AC

## 2019-10-22 DIAGNOSIS — I25810 Atherosclerosis of coronary artery bypass graft(s) without angina pectoris: Secondary | ICD-10-CM | POA: Diagnosis not present

## 2019-10-22 DIAGNOSIS — I4891 Unspecified atrial fibrillation: Secondary | ICD-10-CM | POA: Diagnosis not present

## 2019-10-28 DIAGNOSIS — Z48812 Encounter for surgical aftercare following surgery on the circulatory system: Secondary | ICD-10-CM | POA: Diagnosis not present

## 2019-10-28 DIAGNOSIS — Z951 Presence of aortocoronary bypass graft: Secondary | ICD-10-CM | POA: Diagnosis not present

## 2019-10-28 DIAGNOSIS — I2510 Atherosclerotic heart disease of native coronary artery without angina pectoris: Secondary | ICD-10-CM | POA: Diagnosis not present

## 2019-10-28 DIAGNOSIS — Z87891 Personal history of nicotine dependence: Secondary | ICD-10-CM | POA: Diagnosis not present

## 2019-10-28 DIAGNOSIS — I480 Paroxysmal atrial fibrillation: Secondary | ICD-10-CM | POA: Diagnosis not present

## 2019-10-28 DIAGNOSIS — Z79899 Other long term (current) drug therapy: Secondary | ICD-10-CM | POA: Diagnosis not present

## 2019-11-03 DIAGNOSIS — G4733 Obstructive sleep apnea (adult) (pediatric): Secondary | ICD-10-CM | POA: Diagnosis not present

## 2019-11-03 DIAGNOSIS — Z01812 Encounter for preprocedural laboratory examination: Secondary | ICD-10-CM | POA: Diagnosis not present

## 2019-11-08 DIAGNOSIS — G4733 Obstructive sleep apnea (adult) (pediatric): Secondary | ICD-10-CM | POA: Diagnosis not present

## 2019-11-08 NOTE — Progress Notes (Deleted)
PCP:  Celene Squibb, MD Primary Cardiologist: Cristopher Peru, MD Electrophysiologist: None   Christopher [name] is a 73 y.o. male with past medical history of CAD, PAF, and dyspnea who presents today for routine electrophysiology followup. They are seen for Dr. Lovena Le.   Since last being seen in our clinic, the patient reports doing very well.    The patient feels that he is tolerating medications without difficulties and is otherwise without complaint today.   Past Medical History:  Diagnosis Date  . A-fib ([place])   . Arthritis    "all over my body" (06/17/2017)  . BPH (benign prostatic hyperplasia)    takes Proscar daily  . Chronic back pain    DDD; "upper and lower" (06/17/2017)  . Constipation    takes Colace daily  . DDD (degenerative disc disease), cervical   . DVT (deep venous thrombosis) (HCC)    left peroneal vein 07/2017 (in setting of recent left TKA)  . Dysrhythmia    history of Atrial fibrillation, no long has this  . Enlarged prostate   . GERD (gastroesophageal reflux disease)    takes Nexium daily  . H/O hiatal hernia   . History of colon polyps    benign  . History of gastric ulcer   . Insomnia    takes Xanax nightly  . Joint capsule tear    left knee  . Joint pain   . Joint swelling   . Macular degeneration   . Muscle spasm of back    takes Valium daily as needed  . PAF (paroxysmal atrial fibrillation) ([place])   . Peripheral neuropathy    takes Gabapentin daily  . PONV (postoperative nausea and vomiting)   . Sleep apnea    pt does not wear a cpap  . Urinary hesitancy   . Weakness    numbness and tingling   Past Surgical History:  Procedure Laterality Date  . ANTERIOR CERVICAL DECOMP/DISCECTOMY FUSION  ~ 1991   "took bone from hip"  . ANTERIOR CERVICAL DECOMP/DISCECTOMY FUSION  X 2   "put plate in"  . ANTERIOR CERVICAL DISCECTOMY  <11/2005    5-6 and 4-5./notes 05/02/2011  . APPENDECTOMY    . BACK SURGERY    . BIOPSY  05/30/2018   Procedure: BIOPSY;   Surgeon: [name], MD;  [facility];  Service: Endoscopy;;  esophagus  . CARDIAC CATHETERIZATION    . CARPAL TUNNEL RELEASE Bilateral 10/2005 - 11/2005   right-left Archie Endo 05/02/2011  . CATARACT EXTRACTION W/PHACO Right 05/24/2014   Procedure: CATARACT EXTRACTION PHACO AND INTRAOCULAR LENS PLACEMENT (IOC);  Surgeon: [name], MD;  Location: [facility];  Service: Ophthalmology;  Laterality: Right;  CDE 6.74  . CATARACT EXTRACTION W/PHACO Left 06/17/2014   Procedure: CATARACT EXTRACTION PHACO AND INTRAOCULAR LENS PLACEMENT (IOC);  Surgeon: [name], MD;  Location: [facility];  Service: Ophthalmology;  Laterality: Left;  CDE:4.65  . COLONOSCOPY WITH ESOPHAGOGASTRODUODENOSCOPY (EGD) N/A 01/22/2013   Procedure: COLONOSCOPY WITH ESOPHAGOGASTRODUODENOSCOPY (EGD);  Surgeon: [name], MD;  [facility];  Service: Endoscopy;  Laterality: N/A;  140  . COLONOSCOPY WITH PROPOFOL N/A 05/30/2018   Procedure: COLONOSCOPY WITH PROPOFOL;  Surgeon: [name], MD;  [facility];  Service: Endoscopy;  Laterality: N/A;  . ESOPHAGOGASTRODUODENOSCOPY (EGD) WITH PROPOFOL N/A 05/30/2018   Procedure: ESOPHAGOGASTRODUODENOSCOPY (EGD) WITH PROPOFOL;  Surgeon: [name], MD;  [facility];  Service: Endoscopy;  Laterality: N/A;  . EYE  SURGERY Bilateral    "cleaned film w/laser; since my 1st cataract OR"  . JOINT REPLACEMENT    . LEFT HEART CATH AND CORONARY ANGIOGRAPHY N/A 02/16/2019   Procedure: LEFT HEART CATH AND CORONARY ANGIOGRAPHY;  Surgeon: Belva Crome, MD;  Location: [place] [facility];  Service: Cardiovascular;  Laterality: N/A;  . POSTERIOR LUMBAR FUSION     "put a plate in"  . QUADRICEPS TENDON REPAIR Left 07/01/2017  . QUADRICEPS TENDON REPAIR Left 07/01/2017   Procedure: REPAIR QUADRICEP TENDON;  Surgeon: [name], MD;  Location: [place];  Service: Orthopedics;  Laterality: Left;  . SHOULDER ARTHROSCOPY Bilateral   . SPINAL CORD STIMULATOR  INSERTION N/A 09/23/2015   Procedure: LUMBAR SPINAL CORD STIMULATOR INSERTION;  Surgeon: [name], MD;  Location: [place] [facility];  Service: Neurosurgery;  Laterality: N/A;  LUMBAR SPINAL CORD STIMULATOR INSERTION  . TOTAL KNEE ARTHROPLASTY Left 06/17/2017  . TOTAL KNEE ARTHROPLASTY Left 06/17/2017   Procedure: TOTAL KNEE ARTHROPLASTY;  Surgeon: [name], MD;  Location: [place];  Service: Orthopedics;  Laterality: Left;  . TOTAL KNEE ARTHROPLASTY Right 12/16/2017   Procedure: TOTAL KNEE ARTHROPLASTY;  Surgeon: [name], MD;  Location: [place];  Service: Orthopedics;  Laterality: Right;    Current Outpatient Medications  Medication Sig Dispense Refill  . ALPRAZolam (XANAX) 1 MG tablet Take 1 mg by mouth at bedtime.  0  . amiodarone (PACERONE) 200 MG tablet Take 1 tablet (200 mg total) by mouth daily. 90 tablet 3  . apixaban (ELIQUIS) 5 MG TABS tablet Take 1 tablet (5 mg total) by mouth 2 (two) times daily.    [name] aspirin EC 81 MG tablet Take 1 tablet (81 mg total) by mouth daily. 90 tablet 3  . atorvastatin (LIPITOR) 80 MG tablet Take 1 tablet (80 mg total) by mouth daily. 30 tablet 11  . Calcium Polycarbophil (FIBER-CAPS PO) Take 2 capsules by mouth daily.    . carvedilol (COREG) 3.125 MG tablet Take 1 tablet (3.125 mg total) by mouth 2 (two) times daily with a meal. 60 tablet 11  . Cholecalciferol (VITAMIN D3) 10 MCG (400 UNIT) CAPS Take 400 Units by mouth daily.    [name] docusate sodium (COLACE) 250 MG capsule Take 250 mg by mouth daily.    . finasteride (PROSCAR) 5 MG tablet Take 5 mg by mouth daily.  3  . furosemide (LASIX) 20 MG tablet Take 1 tablet (20 mg total) by mouth daily. 90 tablet 3  . gabapentin (NEURONTIN) 400 MG capsule One three times a day    . HYDROcodone-acetaminophen (NORCO) 10-325 MG tablet Take 1 tablet by mouth every 8 (eight) hours as needed for moderate pain.     . isosorbide mononitrate (IMDUR) 60 MG 24 hr tablet Take 1 tablet (60 mg total) by mouth daily. 90 tablet 3   . metoprolol tartrate (LOPRESSOR) 25 MG tablet Take 25 mg by mouth 2 (two) times daily.    . nitroGLYCERIN (NITROSTAT) 0.4 MG SL tablet Place 1 tablet (0.4 mg total) under the tongue every 5 (five) minutes as needed for chest pain. 25 tablet 5  . pantoprazole (PROTONIX) 40 MG tablet TAKE 1 TABLET BY MOUTH TWICE A DAY (30 TO 60 MINUTES BEFORE 1ST AND LAST MEALS OF THE DAY) 180 tablet 0  . silodosin (RAPAFLO) 8 MG CAPS capsule Take 8 mg by mouth daily.    . Simethicone 180 MG CAPS Take 1 capsule (180 mg total) by mouth 3 (three) times daily as needed.  0  . tamsulosin (FLOMAX) 0.4 MG CAPS capsule Take 0.4 mg by mouth at bedtime.    . vitamin E 200 UNIT capsule Take 200 Units by mouth daily.     No current facility-administered medications for this visit.     Allergies  Allergen Reactions  . Morphine Nausea And Vomiting  . Morphine And Related Nausea And Vomiting  . Propofol Nausea And Vomiting    Social History   Socioeconomic History  . Marital status: Single    Spouse name: Not on file  . Number of children: Not on file  . Years of education: Not on file  . Highest education level: Not on file  Occupational History  . Not on file  Social Needs  . Financial resource strain: Not on file  . Food insecurity    Worry: Not on file    Inability: Not on file  . Transportation needs    Medical: Not on file    Non-medical: Not on file  Tobacco Use  . Smoking status: Former Smoker    Packs/day: 1.00    Years: 30.00    Pack years: 30.00    Types: Cigarettes    Quit date: 01/14/1987    Years since quitting: 32.8  . Smokeless tobacco: Former Systems developer    Types: Chew  . Tobacco comment: quit smoking  & chewing by 1987  Substance and Sexual Activity  . Alcohol use: Yes    Alcohol/week: 15.0 standard drinks    Types: 3 Glasses of wine, 12 Cans of beer per week    Comment: 2-3 times weekly   . Drug use: No  . Sexual activity: Not Currently  Lifestyle  . Physical activity    Days per  week: Not on file    Minutes per session: Not on file  . Stress: Not on file  Relationships  . Social Herbalist on phone: Not on file    Gets together: Not on file    Attends religious service: Not on file    Active member of club or organization: Not on file    Attends meetings of clubs or organizations: Not on file    Relationship status: Not on file  . Intimate partner violence    Fear of current or ex partner: Not on file    Emotionally abused: Not on file    Physically abused: Not on file    Forced sexual activity: Not on file  Other Topics Concern  . Not on file  Social History Narrative  . Not on file     Review of Systems: General: No chills, fever, night sweats or weight changes  Cardiovascular:  No chest pain, dyspnea on exertion, edema, orthopnea, palpitations, paroxysmal nocturnal dyspnea Dermatological: No rash, lesions or masses Respiratory: No cough, dyspnea Urologic: No hematuria, dysuria Abdominal: No nausea, vomiting, diarrhea, bright red blood per rectum, melena, or hematemesis Neurologic: No visual changes, weakness, changes in mental status All other systems reviewed and are otherwise negative except as noted above.  Physical Exam: There were no vitals filed for this visit.  GEN- The patient is well appearing, alert and oriented x 3 today.   HEENT: normocephalic, atraumatic; sclera clear, conjunctiva pink; hearing intact; oropharynx clear; neck supple, no JVP Lymph- no cervical lymphadenopathy Lungs- Clear to ausculation bilaterally, normal work of breathing.  No wheezes, rales, rhonchi Heart- Regular rate and rhythm, no murmurs, rubs or gallops, PMI not laterally displaced GI- soft, non-tender, non-distended, bowel sounds present,  no hepatosplenomegaly Extremities- no clubbing, cyanosis, or edema; DP/PT/radial pulses 2+ bilaterally MS- no significant deformity or atrophy Skin- warm and dry, no rash or lesion Psych- euthymic mood, full  affect Neuro- strength and sensation are intact  EKG is not ordered. Personal review of EKG from 08/27/2019 shows ***  Assessment and Plan:  1. CAD with angina Improved with increase imdur. He had deferred rotational artherectomy with Dr. Tamala Julian. Can reconsider as needed.   2. PAF Appears to be maintaining NSR Continue amiodarone  Shirley Friar, [place]  11/08/19 6:41 PM

## 2019-11-09 ENCOUNTER — Ambulatory Visit: Payer: Medicare Other | Admitting: Student

## 2019-11-09 DIAGNOSIS — G4733 Obstructive sleep apnea (adult) (pediatric): Secondary | ICD-10-CM | POA: Diagnosis not present

## 2019-11-10 ENCOUNTER — Telehealth (HOSPITAL_COMMUNITY): Payer: Self-pay

## 2019-11-10 NOTE — Telephone Encounter (Signed)
Spoke with the patient, instructions given. He stated that he would be here for his test. Asked to call back with any questions. S.Timberlee Roblero EMTP 

## 2019-11-17 ENCOUNTER — Ambulatory Visit (HOSPITAL_COMMUNITY): Payer: Medicare Other

## 2019-11-17 ENCOUNTER — Encounter (HOSPITAL_COMMUNITY): Payer: Self-pay

## 2019-11-17 ENCOUNTER — Other Ambulatory Visit: Payer: Self-pay

## 2019-11-19 ENCOUNTER — Telehealth: Payer: Self-pay | Admitting: Interventional Cardiology

## 2019-11-19 NOTE — Telephone Encounter (Signed)
He does not need that now. He has been fixed.

## 2019-11-19 NOTE — Telephone Encounter (Signed)
Pt in the other day for Lexi that was cancelled due to recent CABG at [facility].  Pt asked the nuclear team to reach out to me about his Imdur 60mg  QD.  Pt wanted to know if he should be taking this medication or not?  Has not taken since surgery but noticed it on his medication list when he had came for Lexi.  Pt did take the last two days and said it caused a terrible HA.  Advised this is a normal reaction to this med.  Pt states he has had a couple of episodes of chest discomfort since surgery but they were mild and didn't last long.  Advised I will send to Dr. Tamala Julian for review.   Pt also inquired about switching to a provider in [place] since he lives out there and prefers not to travel so far.  Advised I will also ask Dr. Tamala Julian if ok to switch to [facility] provider and which one.

## 2019-11-20 NOTE — Telephone Encounter (Signed)
Apt made with Dr.Nishan in [place] 12/21/2019

## 2019-11-20 NOTE — Telephone Encounter (Signed)
Spoke with pt and advised he did not need to take Imdur.  Advised I will send message to [place] team to get pt scheduled with one of our providers at that office.  Pt appreciative for assistance.

## 2019-11-20 NOTE — Telephone Encounter (Signed)
Spoke with Dr. Tamala Julian and he is ok with pt switching to a [place] provider.

## 2019-11-25 DIAGNOSIS — Z9689 Presence of other specified functional implants: Secondary | ICD-10-CM | POA: Diagnosis not present

## 2019-11-25 DIAGNOSIS — M542 Cervicalgia: Secondary | ICD-10-CM | POA: Diagnosis not present

## 2019-11-25 DIAGNOSIS — M961 Postlaminectomy syndrome, not elsewhere classified: Secondary | ICD-10-CM | POA: Diagnosis not present

## 2019-12-02 ENCOUNTER — Other Ambulatory Visit: Payer: Self-pay | Admitting: Internal Medicine

## 2019-12-02 DIAGNOSIS — J22 Unspecified acute lower respiratory infection: Secondary | ICD-10-CM | POA: Diagnosis not present

## 2019-12-03 DIAGNOSIS — S61411D Laceration without foreign body of right hand, subsequent encounter: Secondary | ICD-10-CM | POA: Diagnosis not present

## 2019-12-03 DIAGNOSIS — D519 Vitamin B12 deficiency anemia, unspecified: Secondary | ICD-10-CM | POA: Diagnosis not present

## 2019-12-03 DIAGNOSIS — I4891 Unspecified atrial fibrillation: Secondary | ICD-10-CM | POA: Diagnosis not present

## 2019-12-03 DIAGNOSIS — K219 Gastro-esophageal reflux disease without esophagitis: Secondary | ICD-10-CM | POA: Diagnosis not present

## 2019-12-03 DIAGNOSIS — E559 Vitamin D deficiency, unspecified: Secondary | ICD-10-CM | POA: Diagnosis not present

## 2019-12-14 NOTE — Progress Notes (Signed)
CARDIOLOGY CONSULT NOTE       Virtual Visit via Telephone Note   This visit type was conducted due to national recommendations for restrictions regarding the COVID-19 Pandemic (e.g. social distancing) in an effort to limit this patient's exposure and mitigate transmission in our community.  Due to his co-morbid illnesses, this patient is at least at moderate risk for complications without adequate follow up.  This format is felt to be most appropriate for this patient at this time.  The patient did not have access to video technology/had technical difficulties with video requiring transitioning to audio format only (telephone).  All issues noted in this document were discussed and addressed.  No physical exam could be performed with this format.  Please refer to the patient's chart for his  consent to telehealth for [facility].   Date:  12/21/2019   ID:  Christopher [name], DOB Aug 03, 1946, MRN [id number]  Patient Location: Home Provider Location: Office  PCP:  Celene Squibb, MD  Cardiologist:   Johnsie Cancel Electrophysiologist:  None   Evaluation Performed:  Follow-Up Visit  HPI:  73 y.o. previously seen by Dr Tamala Julian and Lovena Le. History of afib, Cath at our institution 02/16/19 showed diffuse calcified with obstructive disease in proximal / mid LAD , D1 normal EF Medical Rx tried Intolerance to imdur with headache Dr Tamala Julian had talked to patient about high risk orbital atherectomy. However he was seen at [facility] and had CABG by Dr Clementeen Graham in October 2020 This included LAA excision and oversewn Post op visit at [facility] 10/28/19 was fine   G[place]s included SVG PDA-> OM LIMA to LAD  He has been on beta blocker eliquis and amiodarone for his PAF No recent lipids and don't see that he is on statin   He was under the impression that Dr Tamala Julian said he was not a candidate for CABG And atherectomy too high risk so he went to [place]  Has had cough for 3 weeks Negative [place] test No further PAF as far as he can  tell   ROS All other systems reviewed and negative except as noted above  Past Medical History:  Diagnosis Date  . A-fib ([place])   . Arthritis    "all over my body" (06/17/2017)  . BPH (benign prostatic hyperplasia)    takes Proscar daily  . Chronic back pain    DDD; "upper and lower" (06/17/2017)  . Constipation    takes Colace daily  . DDD (degenerative disc disease), cervical   . DVT (deep venous thrombosis) (HCC)    left peroneal vein 07/2017 (in setting of recent left TKA)  . Dysrhythmia    history of Atrial fibrillation, no long has this  . Enlarged prostate   . GERD (gastroesophageal reflux disease)    takes Nexium daily  . H/O hiatal hernia   . History of colon polyps    benign  . History of gastric ulcer   . Insomnia    takes Xanax nightly  . Joint capsule tear    left knee  . Joint pain   . Joint swelling   . Macular degeneration   . Muscle spasm of back    takes Valium daily as needed  . PAF (paroxysmal atrial fibrillation) ([place])   . Peripheral neuropathy    takes Gabapentin daily  . PONV (postoperative nausea and vomiting)   . Sleep apnea    pt does not wear a cpap  . Urinary hesitancy   . Weakness  numbness and tingling    History reviewed. No pertinent family history.  Social History   Socioeconomic History  . Marital status: Single    Spouse name: Not on file  . Number of children: Not on file  . Years of education: Not on file  . Highest education level: Not on file  Occupational History  . Not on file  Tobacco Use  . Smoking status: Former Smoker    Packs/day: 1.00    Years: 30.00    Pack years: 30.00    Types: Cigarettes    Quit date: 01/14/1987    Years since quitting: 32.9  . Smokeless tobacco: Former Systems developer    Types: Chew  . Tobacco comment: quit smoking  & chewing by 1987  Substance and Sexual Activity  . Alcohol use: Yes    Alcohol/week: 15.0 standard drinks    Types: 3 Glasses of wine, 12 Cans of beer per week    Comment: 2-3  times weekly   . Drug use: No  . Sexual activity: Not Currently  Other Topics Concern  . Not on file  Social History Narrative  . Not on file   Social Determinants of Health   Financial Resource Strain:   . Difficulty of Paying Living Expenses: Not on file  Food Insecurity:   . Worried About Charity fundraiser in the Last Year: Not on file  . Ran Out of Food in the Last Year: Not on file  Transportation Needs:   . Lack of Transportation (Medical): Not on file  . Lack of Transportation (Non-Medical): Not on file  Physical Activity:   . Days of Exercise per Week: Not on file  . Minutes of Exercise per Session: Not on file  Stress:   . Feeling of Stress : Not on file  Social Connections:   . Frequency of Communication with Friends and Family: Not on file  . Frequency of Social Gatherings with Friends and Family: Not on file  . Attends Religious Services: Not on file  . Active Member of Clubs or Organizations: Not on file  . Attends Archivist Meetings: Not on file  . Marital Status: Not on file  Intimate Partner Violence:   . Fear of Current or Ex-Partner: Not on file  . Emotionally Abused: Not on file  . Physically Abused: Not on file  . Sexually Abused: Not on file    Past Surgical History:  Procedure Laterality Date  . ANTERIOR CERVICAL DECOMP/DISCECTOMY FUSION  ~ 1991   "took bone from hip"  . ANTERIOR CERVICAL DECOMP/DISCECTOMY FUSION  X 2   "put plate in"  . ANTERIOR CERVICAL DISCECTOMY  <11/2005    5-6 and 4-5./notes 05/02/2011  . APPENDECTOMY    . BACK SURGERY    . BIOPSY  05/30/2018   Procedure: BIOPSY;  Surgeon: [name], MD;  [facility];  Service: Endoscopy;;  esophagus  . CARDIAC CATHETERIZATION    . CARPAL TUNNEL RELEASE Bilateral 10/2005 - 11/2005   right-left Archie Endo 05/02/2011  . CATARACT EXTRACTION W/PHACO Right 05/24/2014   Procedure: CATARACT EXTRACTION PHACO AND INTRAOCULAR LENS PLACEMENT (IOC);  Surgeon: [name], MD;   Location: [facility];  Service: Ophthalmology;  Laterality: Right;  CDE 6.74  . CATARACT EXTRACTION W/PHACO Left 06/17/2014   Procedure: CATARACT EXTRACTION PHACO AND INTRAOCULAR LENS PLACEMENT (IOC);  Surgeon: [name], MD;  Location: [facility];  Service: Ophthalmology;  Laterality: Left;  CDE:4.65  . COLONOSCOPY WITH ESOPHAGOGASTRODUODENOSCOPY (EGD) N/A 01/22/2013  Procedure: COLONOSCOPY WITH ESOPHAGOGASTRODUODENOSCOPY (EGD);  Surgeon: [name], MD;  [facility];  Service: Endoscopy;  Laterality: N/A;  140  . COLONOSCOPY WITH PROPOFOL N/A 05/30/2018   Procedure: COLONOSCOPY WITH PROPOFOL;  Surgeon: [name], MD;  [facility];  Service: Endoscopy;  Laterality: N/A;  . ESOPHAGOGASTRODUODENOSCOPY (EGD) WITH PROPOFOL N/A 05/30/2018   Procedure: ESOPHAGOGASTRODUODENOSCOPY (EGD) WITH PROPOFOL;  Surgeon: [name], MD;  [facility];  Service: Endoscopy;  Laterality: N/A;  . EYE SURGERY Bilateral    "cleaned film w/laser; since my 1st cataract OR"  . JOINT REPLACEMENT    . LEFT HEART CATH AND CORONARY ANGIOGRAPHY N/A 02/16/2019   Procedure: LEFT HEART CATH AND CORONARY ANGIOGRAPHY;  Surgeon: Belva Crome, MD;  Location: [place] [facility];  Service: Cardiovascular;  Laterality: N/A;  . POSTERIOR LUMBAR FUSION     "put a plate in"  . QUADRICEPS TENDON REPAIR Left 07/01/2017  . QUADRICEPS TENDON REPAIR Left 07/01/2017   Procedure: REPAIR QUADRICEP TENDON;  Surgeon: [name], MD;  Location: [place];  Service: Orthopedics;  Laterality: Left;  . SHOULDER ARTHROSCOPY Bilateral   . SPINAL CORD STIMULATOR INSERTION N/A 09/23/2015   Procedure: LUMBAR SPINAL CORD STIMULATOR INSERTION;  Surgeon: [name], MD;  Location: [place] [facility];  Service: Neurosurgery;  Laterality: N/A;  LUMBAR SPINAL CORD STIMULATOR INSERTION  . TOTAL KNEE ARTHROPLASTY Left 06/17/2017  . TOTAL KNEE ARTHROPLASTY Left 06/17/2017   Procedure: TOTAL KNEE ARTHROPLASTY;  Surgeon: [name],  MD;  Location: [place];  Service: Orthopedics;  Laterality: Left;  . TOTAL KNEE ARTHROPLASTY Right 12/16/2017   Procedure: TOTAL KNEE ARTHROPLASTY;  Surgeon: [name], MD;  Location: [place];  Service: Orthopedics;  Laterality: Right;        Physical Exam:  BP 136/81   Pulse 63   Ht 5\' 8"  (1.727 m)   Wt 154 lb (69.9 kg)   BMI 23.42 kg/m    Telephone no exam    Labs:   Lab Results  Component Value Date   WBC 5.6 02/11/2019   HGB 13.2 02/11/2019   HCT 38.5 02/11/2019   MCV 89 02/11/2019   PLT 343 02/11/2019   No results for input(s): NA, K, CL, CO2, BUN, CREATININE, CALCIUM, PROT, BILITOT, ALKPHOS, ALT, AST, GLUCOSE in the last 168 hours.  Invalid input(s): LABALBU Lab Results  Component Value Date   TROPONINI <0.03 02/15/2018    Lab Results  Component Value Date   CHOL 193 11/04/2017   Lab Results  Component Value Date   HDL 52 11/04/2017   Lab Results  Component Value Date   LDLCALC 127 (H) 11/04/2017   Lab Results  Component Value Date   TRIG 72 11/04/2017   Lab Results  Component Value Date   CHOLHDL 3.7 11/04/2017   No results found for: LDLDIRECT    Radiology: No results found.  EKG: SB rate 45   ASSESSMENT AND PLAN:   1. CAD/CABG:  CABG done at [facility] October 2020 LIMA to LAD and sequential vein to PDA/OM  Continue ASA Discussed cardiac rehab referral with patient. EF normal 2018 update echo   2. PAF:  F/u Dr Lovena Le has had bradycardia when in sinus will stop eliquis /amiodarone in 3 months if no further PAF   3. HLD:  Should be on statin will check labs and start if LDL above goal 70   Check TTE for post op EF F/U with Dr Lovena Le in 3  months and me in a year   Time spent reviewing chart including records from [place] and CABG report Talking with patient and formulating plan for f/u 30 minutes   Signed: Jenkins Rouge 12/21/2019, 8:55 AM

## 2019-12-16 ENCOUNTER — Telehealth: Payer: Self-pay | Admitting: Cardiovascular Disease

## 2019-12-16 NOTE — Telephone Encounter (Signed)
Coughing, sick all week.Apt changed to VV

## 2019-12-16 NOTE — Telephone Encounter (Signed)
12/16/2019  - does have a cough with congestion that has worsened a little.  tested 2 weeks ago for COVID and rest was negative.   Please call patient to determine if he needs to reschedule or change to Virtual

## 2019-12-17 DIAGNOSIS — G4733 Obstructive sleep apnea (adult) (pediatric): Secondary | ICD-10-CM | POA: Diagnosis not present

## 2019-12-21 ENCOUNTER — Encounter: Payer: Self-pay | Admitting: Cardiovascular Disease

## 2019-12-21 ENCOUNTER — Telehealth (INDEPENDENT_AMBULATORY_CARE_PROVIDER_SITE_OTHER): Payer: Medicare Other | Admitting: Cardiovascular Disease

## 2019-12-21 VITALS — BP 136/81 | HR 63 | Ht 68.0 in | Wt 154.0 lb

## 2019-12-21 DIAGNOSIS — E785 Hyperlipidemia, unspecified: Secondary | ICD-10-CM

## 2019-12-21 DIAGNOSIS — Z951 Presence of aortocoronary bypass graft: Secondary | ICD-10-CM | POA: Diagnosis not present

## 2019-12-21 DIAGNOSIS — I480 Paroxysmal atrial fibrillation: Secondary | ICD-10-CM | POA: Diagnosis not present

## 2019-12-21 NOTE — Patient Instructions (Signed)
Medication Instructions:  Your physician recommends that you continue on your current medications as directed. Please refer to the Current Medication list given to you today.   Labwork: NONE  Testing/Procedures: Your physician has requested that you have an echocardiogram. Echocardiography is a painless test that uses sound waves to create images of your heart. It provides your doctor with information about the size and shape of your heart and how well your heart's chambers and valves are working. This procedure takes approximately one hour. There are no restrictions for this procedure.    Follow-Up: Your physician recommends that you schedule a follow-up appointment in: DR. Lovena Le IN 3 MONTHS    Any Other Special Instructions Will Be Listed Below (If Applicable).     If you need a refill on your cardiac medications before your next appointment, please call your pharmacy.

## 2019-12-21 NOTE — Addendum Note (Signed)
Addended by: Debbora Lacrosse R on: 12/21/2019 09:13 AM   Modules accepted: Orders

## 2019-12-24 DIAGNOSIS — G4733 Obstructive sleep apnea (adult) (pediatric): Secondary | ICD-10-CM | POA: Diagnosis not present

## 2019-12-25 ENCOUNTER — Other Ambulatory Visit: Payer: Self-pay

## 2019-12-25 ENCOUNTER — Ambulatory Visit (HOSPITAL_COMMUNITY)
Admission: RE | Admit: 2019-12-25 | Discharge: 2019-12-25 | Disposition: A | Discharge status: Home / Self Care | Payer: Medicare Other | Source: Ambulatory Visit | Attending: Cardiovascular Disease | Admitting: Cardiovascular Disease

## 2019-12-25 DIAGNOSIS — Z87891 Personal history of nicotine dependence: Secondary | ICD-10-CM | POA: Diagnosis not present

## 2019-12-25 DIAGNOSIS — I2510 Atherosclerotic heart disease of native coronary artery without angina pectoris: Secondary | ICD-10-CM | POA: Diagnosis not present

## 2019-12-25 DIAGNOSIS — I4891 Unspecified atrial fibrillation: Secondary | ICD-10-CM | POA: Insufficient documentation

## 2019-12-25 DIAGNOSIS — Z951 Presence of aortocoronary bypass graft: Secondary | ICD-10-CM

## 2019-12-25 NOTE — Progress Notes (Signed)
*  PRELIMINARY RESULTS* Echocardiogram 2D Echocardiogram has been performed.  Christopher [name] 12/25/2019, 9:01 AM

## 2020-01-08 DIAGNOSIS — H2513 Age-related nuclear cataract, bilateral: Secondary | ICD-10-CM | POA: Diagnosis not present

## 2020-01-08 DIAGNOSIS — H40033 Anatomical narrow angle, bilateral: Secondary | ICD-10-CM | POA: Diagnosis not present

## 2020-01-18 ENCOUNTER — Other Ambulatory Visit: Payer: Self-pay | Admitting: Internal Medicine

## 2020-01-18 NOTE — Telephone Encounter (Signed)
Ok x 3 months then ov with me or let PCP refill going forward (which is fine with me as long as doing great)

## 2020-01-20 DIAGNOSIS — G4733 Obstructive sleep apnea (adult) (pediatric): Secondary | ICD-10-CM | POA: Diagnosis not present

## 2020-01-24 DIAGNOSIS — G4733 Obstructive sleep apnea (adult) (pediatric): Secondary | ICD-10-CM | POA: Diagnosis not present

## 2020-01-27 ENCOUNTER — Other Ambulatory Visit: Payer: Self-pay | Admitting: Internal Medicine

## 2020-01-31 ENCOUNTER — Other Ambulatory Visit: Payer: Self-pay | Admitting: Interventional Cardiology

## 2020-02-01 ENCOUNTER — Other Ambulatory Visit: Payer: Self-pay | Admitting: Internal Medicine

## 2020-02-21 DIAGNOSIS — G4733 Obstructive sleep apnea (adult) (pediatric): Secondary | ICD-10-CM | POA: Diagnosis not present

## 2020-03-02 ENCOUNTER — Telehealth: Payer: Self-pay

## 2020-03-02 DIAGNOSIS — G4733 Obstructive sleep apnea (adult) (pediatric): Secondary | ICD-10-CM | POA: Diagnosis not present

## 2020-03-02 NOTE — Telephone Encounter (Signed)
Phineas Semen has question [id number]  Please call Phineas Semen [phone]  Thanks  renee

## 2020-03-02 NOTE — Telephone Encounter (Signed)
Pt was confused of medications. Went over medication list. Seems to be on current medications.

## 2020-03-09 ENCOUNTER — Other Ambulatory Visit: Payer: Self-pay

## 2020-03-09 ENCOUNTER — Ambulatory Visit: Payer: Medicare Other | Admitting: Internal Medicine

## 2020-03-09 VITALS — BP 118/69 | HR 57 | Temp 96.3°F | Ht 68.0 in | Wt 165.0 lb

## 2020-03-09 DIAGNOSIS — I25119 Atherosclerotic heart disease of native coronary artery with unspecified angina pectoris: Secondary | ICD-10-CM | POA: Diagnosis not present

## 2020-03-09 DIAGNOSIS — I480 Paroxysmal atrial fibrillation: Secondary | ICD-10-CM

## 2020-03-09 NOTE — Progress Notes (Signed)
HPI Mr. Schoenbauer returns today for followup. He is a pleasant 74 yo man with a h/o CAD, PAF, s/p CABG. He has done well in the interim. He is active and has planted a garden with no problem. He has not had angina though he does have some non-cardiac chest pain. He drinks 3-4 beers a night. Allergies  Allergen Reactions  . Morphine Nausea And Vomiting  . Tylenol [Acetaminophen]     Stomach pain   . Morphine And Related Nausea And Vomiting  . Propofol Nausea And Vomiting     Current Outpatient Medications  Medication Sig Dispense Refill  . ALPRAZolam (XANAX) 1 MG tablet Take 1 mg by mouth at bedtime.  0  . amiodarone (PACERONE) 200 MG tablet TAKE 1 TABLET BY MOUTH EVERY DAY 90 tablet 1  . apixaban (ELIQUIS) 5 MG TABS tablet Take 1 tablet (5 mg total) by mouth 2 (two) times daily.    [name] aspirin EC 81 MG tablet Take 1 tablet (81 mg total) by mouth daily. 90 tablet 3  . atorvastatin (LIPITOR) 80 MG tablet TAKE 1 TABLET BY MOUTH EVERY DAY 90 tablet 3  . Calcium Polycarbophil (FIBER-CAPS PO) Take 2 capsules by mouth daily.    . carvedilol (COREG) 3.125 MG tablet Take 1 tablet (3.125 mg total) by mouth 2 (two) times daily with a meal. 60 tablet 11  . Cholecalciferol (VITAMIN D3) 10 MCG (400 UNIT) CAPS Take 400 Units by mouth daily.    [name] docusate sodium (COLACE) 250 MG capsule Take 250 mg by mouth daily.    . finasteride (PROSCAR) 5 MG tablet Take 5 mg by mouth daily.  3  . furosemide (LASIX) 20 MG tablet TAKE 1 TABLET BY MOUTH EVERY DAY 90 tablet 3  . gabapentin (NEURONTIN) 400 MG capsule One three times a day (Patient taking differently: Take 400 mg by mouth 3 (three) times daily. One three times a day)    . HYDROcodone-acetaminophen (NORCO) 10-325 MG tablet Take 1 tablet by mouth every 8 (eight) hours as needed for moderate pain.     . metoprolol tartrate (LOPRESSOR) 25 MG tablet Take 25 mg by mouth 2 (two) times daily.    . nitroGLYCERIN (NITROSTAT) 0.4 MG SL tablet Place 1 tablet (0.4 mg  total) under the tongue every 5 (five) minutes as needed for chest pain. 25 tablet 5  . pantoprazole (PROTONIX) 40 MG tablet TAKE 1 TABLET BY MOUTH TWICE A DAY (30 TO 60 MINUTES BEFORE 1ST AND LAST MEALS OF THE DAY) 180 tablet 0  . silodosin (RAPAFLO) 8 MG CAPS capsule Take 8 mg by mouth daily.    . Simethicone 180 MG CAPS Take 1 capsule (180 mg total) by mouth 3 (three) times daily as needed.  0  . tamsulosin (FLOMAX) 0.4 MG CAPS capsule Take 0.4 mg by mouth at bedtime.    . vitamin E 200 UNIT capsule Take 200 Units by mouth daily.    . benzonatate (TESSALON) 100 MG capsule TAKE 1 2 CAPSULES BY MOUTH EVERY 8 HOURS AS NEEDED FOR COUGH     No current facility-administered medications for this visit.     Past Medical History:  Diagnosis Date  . A-fib ([place])   . Arthritis    "all over my body" (06/17/2017)  . BPH (benign prostatic hyperplasia)    takes Proscar daily  . Chronic back pain    DDD; "upper and lower" (06/17/2017)  . Constipation    takes Colace daily  .  DDD (degenerative disc disease), cervical   . DVT (deep venous thrombosis) ([facility])    left peroneal vein 07/2017 (in setting of recent left TKA)  . Dysrhythmia    history of Atrial fibrillation, no long has this  . Enlarged prostate   . GERD (gastroesophageal reflux disease)    takes Nexium daily  . H/O hiatal hernia   . History of colon polyps    benign  . History of gastric ulcer   . Insomnia    takes Xanax nightly  . Joint capsule tear    left knee  . Joint pain   . Joint swelling   . Macular degeneration   . Muscle spasm of back    takes Valium daily as needed  . PAF (paroxysmal atrial fibrillation) ([place])   . Peripheral neuropathy    takes Gabapentin daily  . PONV (postoperative nausea and vomiting)   . Sleep apnea    pt does not wear a cpap  . Urinary hesitancy   . Weakness    numbness and tingling    ROS:   All systems reviewed and negative except as noted in the HPI.   Past Surgical History:    Procedure Laterality Date  . ANTERIOR CERVICAL DECOMP/DISCECTOMY FUSION  ~ 1991   "took bone from hip"  . ANTERIOR CERVICAL DECOMP/DISCECTOMY FUSION  X 2   "put plate in"  . ANTERIOR CERVICAL DISCECTOMY  <11/2005    5-6 and 4-5./notes 05/02/2011  . APPENDECTOMY    . BACK SURGERY    . BIOPSY  05/30/2018   Procedure: BIOPSY;  Surgeon: [name], MD;  [facility];  Service: Endoscopy;;  esophagus  . CARDIAC CATHETERIZATION    . CARPAL TUNNEL RELEASE Bilateral 10/2005 - 11/2005   right-left [facility] 05/02/2011  . CATARACT EXTRACTION W/PHACO Right 05/24/2014   Procedure: CATARACT EXTRACTION PHACO AND INTRAOCULAR LENS PLACEMENT (IOC);  Surgeon: [name], MD;  Location: [facility];  Service: Ophthalmology;  Laterality: Right;  CDE 6.74  . CATARACT EXTRACTION W/PHACO Left 06/17/2014   Procedure: CATARACT EXTRACTION PHACO AND INTRAOCULAR LENS PLACEMENT (IOC);  Surgeon: [name], MD;  Location: [facility];  Service: Ophthalmology;  Laterality: Left;  CDE:4.65  . COLONOSCOPY WITH ESOPHAGOGASTRODUODENOSCOPY (EGD) N/A 01/22/2013   Procedure: COLONOSCOPY WITH ESOPHAGOGASTRODUODENOSCOPY (EGD);  Surgeon: [name], MD;  [facility];  Service: Endoscopy;  Laterality: N/A;  140  . COLONOSCOPY WITH PROPOFOL N/A 05/30/2018   Procedure: COLONOSCOPY WITH PROPOFOL;  Surgeon: [name], MD;  [facility];  Service: Endoscopy;  Laterality: N/A;  . ESOPHAGOGASTRODUODENOSCOPY (EGD) WITH PROPOFOL N/A 05/30/2018   Procedure: ESOPHAGOGASTRODUODENOSCOPY (EGD) WITH PROPOFOL;  Surgeon: [name], MD;  [facility];  Service: Endoscopy;  Laterality: N/A;  . EYE SURGERY Bilateral    "cleaned film w/laser; since my 1st cataract OR"  . JOINT REPLACEMENT    . LEFT HEART CATH AND CORONARY ANGIOGRAPHY N/A 02/16/2019   Procedure: LEFT HEART CATH AND CORONARY ANGIOGRAPHY;  Surgeon: Belva Crome, MD;  Location: [facility];  Service: Cardiovascular;  Laterality: N/A;   . POSTERIOR LUMBAR FUSION     "put a plate in"  . QUADRICEPS TENDON REPAIR Left 07/01/2017  . QUADRICEPS TENDON REPAIR Left 07/01/2017   Procedure: REPAIR QUADRICEP TENDON;  Surgeon: [name], MD;  Location: [facility];  Service: Orthopedics;  Laterality: Left;  . SHOULDER ARTHROSCOPY Bilateral   . SPINAL CORD STIMULATOR INSERTION N/A 09/23/2015   Procedure:  LUMBAR SPINAL CORD STIMULATOR INSERTION;  Surgeon: [name], MD;  Location: [facility];  Service: Neurosurgery;  Laterality: N/A;  LUMBAR SPINAL CORD STIMULATOR INSERTION  . TOTAL KNEE ARTHROPLASTY Left 06/17/2017  . TOTAL KNEE ARTHROPLASTY Left 06/17/2017   Procedure: TOTAL KNEE ARTHROPLASTY;  Surgeon: [name], MD;  Location: [facility];  Service: Orthopedics;  Laterality: Left;  . TOTAL KNEE ARTHROPLASTY Right 12/16/2017   Procedure: TOTAL KNEE ARTHROPLASTY;  Surgeon: [name], MD;  Location: [facility];  Service: Orthopedics;  Laterality: Right;     No family history on file.   Social History   Socioeconomic History  . Marital status: Single    Spouse name: Not on file  . Number of children: Not on file  . Years of education: Not on file  . Highest education level: Not on file  Occupational History  . Not on file  Tobacco Use  . Smoking status: Former Smoker    Packs/day: 1.00    Years: 30.00    Pack years: 30.00    Types: Cigarettes    Quit date: 01/14/1987    Years since quitting: 33.1  . Smokeless tobacco: Former Systems developer    Types: Chew  . Tobacco comment: quit smoking  & chewing by 1987  Substance and Sexual Activity  . Alcohol use: Yes    Alcohol/week: 15.0 standard drinks    Types: 3 Glasses of wine, 12 Cans of beer per week    Comment: 2-3 times weekly   . Drug use: No  . Sexual activity: Not Currently  Other Topics Concern  . Not on file  Social History Narrative  . Not on file   Social Determinants of Health   Financial Resource Strain:   . Difficulty of Paying Living Expenses:   Food Insecurity:    . Worried About Charity fundraiser in the Last Year:   . Arboriculturist in the Last Year:   Transportation Needs:   . Film/video editor (Medical):   [name] Lack of Transportation (Non-Medical):   Physical Activity:   . Days of Exercise per Week:   . Minutes of Exercise per Session:   Stress:   . Feeling of Stress :   Social Connections:   . Frequency of Communication with Friends and Family:   . Frequency of Social Gatherings with Friends and Family:   . Attends Religious Services:   . Active Member of Clubs or Organizations:   . Attends Archivist Meetings:   [name] Marital Status:   Intimate Partner Violence:   . Fear of Current or Ex-Partner:   . Emotionally Abused:   [name] Physically Abused:   . Sexually Abused:      BP 118/69   Pulse (!) 57   Temp (!) 96.3 F (35.7 C)   Ht 5\' 8"  (1.727 m)   Wt 165 lb (74.8 kg)   BMI 25.09 kg/m   Physical Exam:  Well appearing NAD HEENT: Unremarkable Neck:  No JVD, no thyromegally Lymphatics:  No adenopathy Back:  No CVA tenderness Lungs:  Clear with no wheezes HEART:  Regular rate rhythm, no murmurs, no rubs, no clicks Abd:  soft, positive bowel sounds, no organomegally, no rebound, no guarding Ext:  2 plus pulses, no edema, no cyanosis, no clubbing Skin:  No rashes no nodules Neuro:  CN II through XII intact, motor grossly intact  Assess/Plan: 1. PAF - he is maintaining NSR on amiodarone. I would consider having him reduce his  dose when I see him back in a year. 2. CAD - he is s/p CABG, and has done well. 3. ETOH use - I encouraged him to reduce his beer consumption down to 1-2 daily instead of 3-4 daily. 4. HTN - his SBP is well controlled. No change in meds.   Mikle [email or web address].D.

## 2020-03-09 NOTE — Patient Instructions (Signed)
Medication Instructions:  Your physician recommends that you continue on your current medications as directed. Please refer to the Current Medication list given to you today.  *If you need a refill on your cardiac medications before your next appointment, please call your pharmacy*   Lab Work: NONE   If you have labs (blood work) drawn today and your tests are completely normal, you will receive your results only by: . MyChart Message (if you have MyChart) OR . A paper copy in the mail If you have any lab test that is abnormal or we need to change your treatment, we will call you to review the results.   Testing/Procedures: NONE    Follow-Up: At [facility], you and your health needs are our priority.  As part of our continuing mission to provide you with exceptional heart care, we have created designated Provider Care Teams.  These Care Teams include your primary Cardiologist (physician) and Advanced Practice Providers (APPs -  Physician Assistants and Nurse Practitioners) who all work together to provide you with the care you need, when you need it.  We recommend signing up for the patient portal called "MyChart".  Sign up information is provided on this After Visit Summary.  MyChart is used to connect with patients for Virtual Visits (Telemedicine).  Patients are able to view lab/test results, encounter notes, upcoming appointments, etc.  Non-urgent messages can be sent to your provider as well.   To learn more about what you can do with MyChart, go to [email or web address].    Your next appointment:   1 year(s)  The format for your next appointment:   In Person  Provider:   Gregg Taylor, MD   Other Instructions Thank you for choosing [facility]!    

## 2020-03-10 DIAGNOSIS — S61411D Laceration without foreign body of right hand, subsequent encounter: Secondary | ICD-10-CM | POA: Diagnosis not present

## 2020-03-10 DIAGNOSIS — E559 Vitamin D deficiency, unspecified: Secondary | ICD-10-CM | POA: Diagnosis not present

## 2020-03-10 DIAGNOSIS — F5101 Primary insomnia: Secondary | ICD-10-CM | POA: Diagnosis not present

## 2020-03-10 DIAGNOSIS — R7301 Impaired fasting glucose: Secondary | ICD-10-CM | POA: Diagnosis not present

## 2020-03-10 DIAGNOSIS — Z0000 Encounter for general adult medical examination without abnormal findings: Secondary | ICD-10-CM | POA: Diagnosis not present

## 2020-03-10 DIAGNOSIS — D519 Vitamin B12 deficiency anemia, unspecified: Secondary | ICD-10-CM | POA: Diagnosis not present

## 2020-03-10 DIAGNOSIS — E782 Mixed hyperlipidemia: Secondary | ICD-10-CM | POA: Diagnosis not present

## 2020-03-10 DIAGNOSIS — Z136 Encounter for screening for cardiovascular disorders: Secondary | ICD-10-CM | POA: Diagnosis not present

## 2020-03-10 DIAGNOSIS — Z6824 Body mass index (BMI) 24.0-24.9, adult: Secondary | ICD-10-CM | POA: Diagnosis not present

## 2020-03-10 DIAGNOSIS — D509 Iron deficiency anemia, unspecified: Secondary | ICD-10-CM | POA: Diagnosis not present

## 2020-03-14 ENCOUNTER — Ambulatory Visit: Payer: Medicare Other | Admitting: Internal Medicine

## 2020-03-15 DIAGNOSIS — E782 Mixed hyperlipidemia: Secondary | ICD-10-CM | POA: Diagnosis not present

## 2020-03-15 DIAGNOSIS — Z0001 Encounter for general adult medical examination with abnormal findings: Secondary | ICD-10-CM | POA: Diagnosis not present

## 2020-03-15 DIAGNOSIS — I4891 Unspecified atrial fibrillation: Secondary | ICD-10-CM | POA: Diagnosis not present

## 2020-03-15 DIAGNOSIS — F5101 Primary insomnia: Secondary | ICD-10-CM | POA: Diagnosis not present

## 2020-03-15 DIAGNOSIS — E559 Vitamin D deficiency, unspecified: Secondary | ICD-10-CM | POA: Diagnosis not present

## 2020-03-15 DIAGNOSIS — K8080 Other cholelithiasis without obstruction: Secondary | ICD-10-CM | POA: Diagnosis not present

## 2020-03-15 DIAGNOSIS — K219 Gastro-esophageal reflux disease without esophagitis: Secondary | ICD-10-CM | POA: Diagnosis not present

## 2020-03-15 DIAGNOSIS — D519 Vitamin B12 deficiency anemia, unspecified: Secondary | ICD-10-CM | POA: Diagnosis not present

## 2020-03-15 DIAGNOSIS — I10 Essential (primary) hypertension: Secondary | ICD-10-CM | POA: Diagnosis not present

## 2020-03-15 DIAGNOSIS — D509 Iron deficiency anemia, unspecified: Secondary | ICD-10-CM | POA: Diagnosis not present

## 2020-03-15 DIAGNOSIS — I25119 Atherosclerotic heart disease of native coronary artery with unspecified angina pectoris: Secondary | ICD-10-CM | POA: Diagnosis not present

## 2020-03-21 DIAGNOSIS — M542 Cervicalgia: Secondary | ICD-10-CM | POA: Diagnosis not present

## 2020-03-21 DIAGNOSIS — Z9689 Presence of other specified functional implants: Secondary | ICD-10-CM | POA: Diagnosis not present

## 2020-03-21 DIAGNOSIS — M961 Postlaminectomy syndrome, not elsewhere classified: Secondary | ICD-10-CM | POA: Diagnosis not present

## 2020-03-23 ENCOUNTER — Other Ambulatory Visit: Payer: Self-pay | Admitting: Internal Medicine

## 2020-03-23 DIAGNOSIS — G4733 Obstructive sleep apnea (adult) (pediatric): Secondary | ICD-10-CM | POA: Diagnosis not present

## 2020-04-22 DIAGNOSIS — G4733 Obstructive sleep apnea (adult) (pediatric): Secondary | ICD-10-CM | POA: Diagnosis not present

## 2020-05-23 DIAGNOSIS — G4733 Obstructive sleep apnea (adult) (pediatric): Secondary | ICD-10-CM | POA: Diagnosis not present

## 2020-06-22 DIAGNOSIS — G4733 Obstructive sleep apnea (adult) (pediatric): Secondary | ICD-10-CM | POA: Diagnosis not present

## 2020-06-30 ENCOUNTER — Encounter: Payer: Self-pay | Admitting: Urology

## 2020-06-30 ENCOUNTER — Other Ambulatory Visit: Payer: Self-pay

## 2020-06-30 ENCOUNTER — Ambulatory Visit (INDEPENDENT_AMBULATORY_CARE_PROVIDER_SITE_OTHER): Payer: Medicare Other | Admitting: Urology

## 2020-06-30 VITALS — BP 111/58 | HR 46 | Temp 98.0°F | Ht 68.0 in | Wt 162.0 lb

## 2020-06-30 DIAGNOSIS — N401 Enlarged prostate with lower urinary tract symptoms: Secondary | ICD-10-CM

## 2020-06-30 DIAGNOSIS — R351 Nocturia: Secondary | ICD-10-CM | POA: Diagnosis not present

## 2020-06-30 DIAGNOSIS — N138 Other obstructive and reflux uropathy: Secondary | ICD-10-CM

## 2020-06-30 MED ORDER — FINASTERIDE 5 MG PO TABS: 5.0000 mg | ORAL_TABLET | Freq: Every day | ORAL | 3 refills | Status: DC

## 2020-06-30 MED ORDER — SILODOSIN 8 MG PO CAPS: 8.0000 mg | ORAL_CAPSULE | Freq: Every day | ORAL | 3 refills | Status: DC

## 2020-06-30 NOTE — Progress Notes (Signed)
06/30/2020 11:51 AM   Christopher [name] August 04, 1946 [id number]  Referring provider: Celene Squibb, MD [address] [name],  [place] [address]  BPH with nocturia  HPI: [name] is a 74yo here for followup for BPH with LUTS, nocturia. He has stable LUTS on rapaflo and finasteride. He has nocturia 2-3x. He has an intermittent weak stream with occasional dribbling. NO hematuria or dysuria. No urinary frequency or urgency. He wishes to pursue Urolift He underwent CABG 09/2019 and remains on eliquis.  His records from [facility] are as follows: <HTML><META HTTP-EQUIV="content-type" CONTENT="text/html;charset=utf-8"><TR><TD><B>BPH</B></TD></TR><TR><TH><B>HPI:</B></TH><TD>Christopher [name] is a 74 year-old male established patient who is here <B>for follow up of BPH and lower urinary tract symptoms</B>.<BR><BR>Patient is currently treated with Finasteride and tamsulosin 0.8 mg for his symptoms. The patient complains of lower urinary tract symptom(s) that include hesitancy and weak stream. The patient states his most bothersome symptom(s) are the following: hesitancy. <BR><BR>He denies any other associated symptoms. The patient states if he were to spend the rest of his life with his current urinary condition, he would be mixed. His urinary symptoms have been stable since his last office visit. This condition would be considered of mild to moderate severity with no modifying factors or associated signs or symptoms other than as noted above. <BR><BR><B>06/09/19: He continues to have bothersome voiding symptoms consisting of a slow, dribbling stream with hesitancy although he says he only gets up once a night and sometimes he will get up at all. This is despite the fact that he is now taking tamsulosin at a 0.8 mg dose as well as finasteride. We had discussed previously the fact that if he did not see significant enough improvement on pharmacologic therapy then some form of surgical procedure to reduce outlet resistance would  be necessary. He said he is interested in pursuing that further now. <BR><BR>06/25/2019: He is currently on finasteride 5mg  and flomax 0.8mg  daily. He continues to have a weak stream at night and in the morning. No dysuira. no hematuria. 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 He is seeing another cardiologist for consideration of plaque excision. He is taking rapaflo 8mg  which slightly improved his LUTS <BR><BR>09/14/2019: He is being evaluated by another cardiologist at [facility]. He has improving LUTS on rapaflo 8mg  <BR><BR>12/08/2019: Since elast visit he had CABG x3 on 10/8. He has nocturia 1x and dribbling in the morning</B></TD></TR>   PMH: Past Medical History:  Diagnosis Date  . A-fib ([place])   . Arthritis    "all over my body" (06/17/2017)  . BPH (benign prostatic hyperplasia)    takes Proscar daily  . Chronic back pain    DDD; "upper and lower" (06/17/2017)  . Constipation    takes Colace daily  . DDD (degenerative disc disease), cervical   . DVT (deep venous thrombosis) ([facility])    left peroneal vein 07/2017 (in setting of recent left TKA)  . Dysrhythmia    history of Atrial fibrillation, no long has this  . Enlarged prostate   . GERD (gastroesophageal reflux disease)    takes Nexium daily  . H/O hiatal hernia   . History of colon polyps    benign  . History of gastric ulcer   . Insomnia    takes Xanax nightly  . Joint capsule tear    left knee  . Joint pain   . Joint swelling   . Macular degeneration   . Muscle spasm of  back    takes Valium daily as needed  . PAF (paroxysmal atrial fibrillation) ([place])   . Peripheral neuropathy    takes Gabapentin daily  . PONV (postoperative nausea and vomiting)   . Sleep apnea    pt does not wear a cpap  . Urinary hesitancy   . Weakness    numbness and tingling    Surgical History: Past Surgical History:  Procedure Laterality Date  . ANTERIOR CERVICAL  DECOMP/DISCECTOMY FUSION  ~ 1991   "took bone from hip"  . ANTERIOR CERVICAL DECOMP/DISCECTOMY FUSION  X 2   "put plate in"  . ANTERIOR CERVICAL DISCECTOMY  <11/2005    5-6 and 4-5./notes 05/02/2011  . APPENDECTOMY    . BACK SURGERY    . BIOPSY  05/30/2018   Procedure: BIOPSY;  Surgeon: [name], MD;  [facility];  Service: Endoscopy;;  esophagus  . CARDIAC CATHETERIZATION    . CARPAL TUNNEL RELEASE Bilateral 10/2005 - 11/2005   right-left [facility] 05/02/2011  . CATARACT EXTRACTION W/PHACO Right 05/24/2014   Procedure: CATARACT EXTRACTION PHACO AND INTRAOCULAR LENS PLACEMENT (IOC);  Surgeon: [name], MD;  Location: [facility];  Service: Ophthalmology;  Laterality: Right;  CDE 6.74  . CATARACT EXTRACTION W/PHACO Left 06/17/2014   Procedure: CATARACT EXTRACTION PHACO AND INTRAOCULAR LENS PLACEMENT (IOC);  Surgeon: [name], MD;  Location: [facility];  Service: Ophthalmology;  Laterality: Left;  CDE:4.65  . COLONOSCOPY WITH ESOPHAGOGASTRODUODENOSCOPY (EGD) N/A 01/22/2013   Procedure: COLONOSCOPY WITH ESOPHAGOGASTRODUODENOSCOPY (EGD);  Surgeon: [name], MD;  [facility];  Service: Endoscopy;  Laterality: N/A;  140  . COLONOSCOPY WITH PROPOFOL N/A 05/30/2018   Procedure: COLONOSCOPY WITH PROPOFOL;  Surgeon: [name], MD;  [facility];  Service: Endoscopy;  Laterality: N/A;  . ESOPHAGOGASTRODUODENOSCOPY (EGD) WITH PROPOFOL N/A 05/30/2018   Procedure: ESOPHAGOGASTRODUODENOSCOPY (EGD) WITH PROPOFOL;  Surgeon: [name], MD;  [facility];  Service: Endoscopy;  Laterality: N/A;  . EYE SURGERY Bilateral    "cleaned film w/laser; since my 1st cataract OR"  . JOINT REPLACEMENT    . LEFT HEART CATH AND CORONARY ANGIOGRAPHY N/A 02/16/2019   Procedure: LEFT HEART CATH AND CORONARY ANGIOGRAPHY;  Surgeon: Belva Crome, MD;  Location: [facility];  Service: Cardiovascular;  Laterality: N/A;  . POSTERIOR LUMBAR FUSION     "put a plate in"   . QUADRICEPS TENDON REPAIR Left 07/01/2017  . QUADRICEPS TENDON REPAIR Left 07/01/2017   Procedure: REPAIR QUADRICEP TENDON;  Surgeon: [name], MD;  Location: [facility];  Service: Orthopedics;  Laterality: Left;  . SHOULDER ARTHROSCOPY Bilateral   . SPINAL CORD STIMULATOR INSERTION N/A 09/23/2015   Procedure: LUMBAR SPINAL CORD STIMULATOR INSERTION;  Surgeon: [name], MD;  Location: [facility];  Service: Neurosurgery;  Laterality: N/A;  LUMBAR SPINAL CORD STIMULATOR INSERTION  . TOTAL KNEE ARTHROPLASTY Left 06/17/2017  . TOTAL KNEE ARTHROPLASTY Left 06/17/2017   Procedure: TOTAL KNEE ARTHROPLASTY;  Surgeon: [name], MD;  Location: [facility];  Service: Orthopedics;  Laterality: Left;  . TOTAL KNEE ARTHROPLASTY Right 12/16/2017   Procedure: TOTAL KNEE ARTHROPLASTY;  Surgeon: [name], MD;  Location: [facility];  Service: Orthopedics;  Laterality: Right;    Home Medications:  Allergies as of 06/30/2020      Reactions   Morphine Nausea And Vomiting   Tylenol [acetaminophen]    Stomach pain    Morphine And Related Nausea And Vomiting   Propofol Nausea  And Vomiting      Medication List       Accurate as of June 30, 2020 11:51 AM. If you have any questions, ask your nurse or doctor.        ALPRAZolam 1 MG tablet Commonly known as: XANAX Take 1 mg by mouth at bedtime.   amiodarone 200 MG tablet Commonly known as: PACERONE TAKE 1 TABLET BY MOUTH EVERY DAY   aspirin EC 81 MG tablet Take 1 tablet (81 mg total) by mouth daily.   atorvastatin 80 MG tablet Commonly known as: LIPITOR TAKE 1 TABLET BY MOUTH EVERY DAY   benzonatate 100 MG capsule Commonly known as: TESSALON TAKE 1 2 CAPSULES BY MOUTH EVERY 8 HOURS AS NEEDED FOR COUGH   carvedilol 3.125 MG tablet Commonly known as: COREG Take 1 tablet (3.125 mg total) by mouth 2 (two) times daily with a meal.   docusate sodium 250 MG capsule Commonly known as: COLACE Take 250 mg by mouth daily.   Eliquis 5 MG Tabs  tablet Generic drug: apixaban Take 1 tablet (5 mg total) by mouth 2 (two) times daily.   FIBER-CAPS PO Take 2 capsules by mouth daily.   finasteride 5 MG tablet Commonly known as: PROSCAR Take 5 mg by mouth daily.   furosemide 20 MG tablet Commonly known as: LASIX TAKE 1 TABLET BY MOUTH EVERY DAY   gabapentin 400 MG capsule Commonly known as: NEURONTIN One three times a day What changed:   how much to take  how to take this  when to take this   HYDROcodone-acetaminophen 10-325 MG tablet Commonly known as: NORCO Take 1 tablet by mouth every 8 (eight) hours as needed for moderate pain.   metoprolol tartrate 25 MG tablet Commonly known as: LOPRESSOR Take 25 mg by mouth 2 (two) times daily.   nitroGLYCERIN 0.4 MG SL tablet Commonly known as: NITROSTAT Place 1 tablet (0.4 mg total) under the tongue every 5 (five) minutes as needed for chest pain.   Omega-3 1000 MG Caps Take by mouth.   pantoprazole 40 MG tablet Commonly known as: PROTONIX TAKE 1 TABLET BY MOUTH TWICE A DAY (30 TO 60 MINUTES BEFORE 1ST AND LAST MEALS OF THE DAY)   potassium chloride 10 MEQ tablet Commonly known as: KLOR-CON Take by mouth.   silodosin 8 MG Caps capsule Commonly known as: RAPAFLO Take 8 mg by mouth daily.   Simethicone 180 MG Caps Take 1 capsule (180 mg total) by mouth 3 (three) times daily as needed.   tamsulosin 0.4 MG Caps capsule Commonly known as: FLOMAX Take 0.4 mg by mouth at bedtime.   Vitamin D3 10 MCG (400 UNIT) Caps Take 400 Units by mouth daily.   vitamin E 200 UNIT capsule Take 200 Units by mouth daily.       Allergies:  Allergies  Allergen Reactions  . Morphine Nausea And Vomiting  . Tylenol [Acetaminophen]     Stomach pain   . Morphine And Related Nausea And Vomiting  . Propofol Nausea And Vomiting    Family History: History reviewed. No pertinent family history.  Social History:  reports that he quit smoking about 33 years ago. His smoking use  included cigarettes. He has a 30.00 pack-year smoking history. He has quit using smokeless tobacco.  His smokeless tobacco use included chew. He reports current alcohol use of about 15.0 standard drinks of alcohol per week. He reports that he does not use drugs.  ROS: All other review of systems  were reviewed and are negative except what is noted above in HPI  Physical Exam: BP (!) 111/58   Pulse (!) 46   Temp 98 F (36.7 C)   Ht 5\' 8"  (1.727 m)   Wt 162 lb (73.5 kg)   BMI 24.63 kg/m   Constitutional:  Alert and oriented, No acute distress. HEENT: [place] AT, moist mucus membranes.  Trachea midline, no masses. Cardiovascular: No clubbing, cyanosis, or edema. Respiratory: Normal respiratory effort, no increased work of breathing. GI: Abdomen is soft, nontender, nondistended, no abdominal masses GU: No CVA tenderness.  Lymph: No cervical or inguinal lymphadenopathy. Skin: No rashes, bruises or suspicious lesions. Neurologic: Grossly intact, no focal deficits, moving all 4 extremities. Psychiatric: Normal mood and affect.  Laboratory Data: Lab Results  Component Value Date   WBC 5.6 02/11/2019   HGB 13.2 02/11/2019   HCT 38.5 02/11/2019   MCV 89 02/11/2019   PLT 343 02/11/2019    Lab Results  Component Value Date   CREATININE 0.82 02/11/2019    No results found for: PSA  No results found for: TESTOSTERONE  No results found for: HGBA1C  Urinalysis    Component Value Date/Time   COLORURINE YELLOW 03/07/2014 2103   APPEARANCEUR CLEAR 03/07/2014 2103   LABSPEC 1.014 03/07/2014 2103   PHURINE 7.5 03/07/2014 2103   GLUCOSEU NEGATIVE 03/07/2014 2103   HGBUR NEGATIVE 03/07/2014 2103   BILIRUBINUR NEGATIVE 03/07/2014 2103   KETONESUR NEGATIVE 03/07/2014 2103   PROTEINUR NEGATIVE 03/07/2014 2103   UROBILINOGEN 0.2 03/07/2014 2103   NITRITE NEGATIVE 03/07/2014 2103   LEUKOCYTESUR NEGATIVE 03/07/2014 2103    No results found for: LABMICR, [place], RBCUA, LABEPIT, MUCUS,  BACTERIA  Pertinent Imaging:  No results found for this or any previous visit.  No results found for this or any previous visit.  No results found for this or any previous visit.  No results found for this or any previous visit.  No results found for this or any previous visit.  No results found for this or any previous visit.  No results found for this or any previous visit.  Results for orders placed during the hospital encounter of 02/21/17  CT RENAL STONE STUDY  Narrative CLINICAL DATA:  Right flank pain for 5 days, history of appendectomy  EXAM: CT ABDOMEN AND PELVIS WITHOUT CONTRAST  TECHNIQUE: Multidetector CT imaging of the abdomen and pelvis was performed following the standard protocol without IV contrast.  COMPARISON:  None.  FINDINGS: Lower chest: Lung bases shows no acute findings. Partially visualized lower thoracic spinal canal stimulator wires  Hepatobiliary: Unenhanced liver shows no biliary ductal dilatation. Question tiny noncalcified gallstones or sludge within dependent gallbladder the largest measures 3 mm.  Pancreas: Unenhanced pancreas with normal appearance.  Spleen: Unenhanced spleen with normal appearance.  Adrenals/Urinary Tract: No adrenal gland mass. Unenhanced kidneys are symmetrical in size. No hydronephrosis or hydroureter. No nephrolithiasis. No calcified ureteral calculi. No calcified calculi are noted within urinary bladder. Mild thickening of urinary bladder wall. Mild cystitis or chronic inflammation cannot be excluded.  Stomach/Bowel: There is no small bowel obstruction. The study is limited without oral or IV contrast. There is some gas in distal small bowel loops without thickening of small bowel wall. Minimal ileus cannot be excluded. There is no small bowel obstruction. No transition point in caliber of small bowel. No pericecal inflammation. The terminal ileum is unremarkable. The patient is status post  appendectomy. Some colonic stool and gas noted within right colon. There is  some colonic stool and gas within descending colon and sigmoid colon. No distal colonic obstruction. No definite evidence of colitis or diverticulitis.  Vascular/Lymphatic: Atherosclerotic calcifications of abdominal aorta and iliac arteries. No adenopathy is noted. No aortic aneurysm.  Reproductive: Mild enlarged prostate gland with indentation of urinary bladder base. Prostate gland measures 3.8 by 4.3 cm.  Other: There is a left inguinal scrotal canal hernia containing fat measures 1.6 cm without evidence of acute complication. Small nonspecific bilateral inguinal lymph nodes are noted. The largest left inguinal lymph node measures 6 mm short-axis. The largest right inguinal lymph node measures 7.5 mm short-axis. These are not pathologic by size criteria.  Musculoskeletal: No destructive bony lesions are noted. Sagittal images of the spine shows degenerative changes thoracolumbar spine. Posterior fusion noted lower lumbar spine at L4-L5 level with anatomic alignment.  IMPRESSION: 1. No nephrolithiasis. No hydronephrosis or hydroureter. Question tiny layering sludge or noncalcified gallstones within gallbladder the largest measures 3 mm. 2. No calcified calculi are noted within urinary bladder. Mild thickening of urinary bladder wall. Mild cystitis or chronic inflammation cannot be excluded. Clinical correlation is necessary. 3. Some gas noted within distal small bowel loops without significant small bowel distension. No transition point in caliber of small bowel. Mild ileus cannot be excluded. No definite evidence of small bowel obstruction. 4. No pericecal inflammation. The patient is status post appendectomy. Unremarkable terminal ileum. Moderate stool noted in cecum and proximal right colon. 5. No calcified ureteral calculi are noted. 6. Mild enlarged prostate gland with indentation of the  bladder base. 7. Small nonspecific bilateral inguinal lymph nodes are noted. There is a small left inguinal scrotal canal hernia containing fat measures 1.6 cm no evidence of acute complication.   Electronically Signed By: Lahoma Crocker M.D. On: 02/22/2017 09:02   Assessment & Plan:    1. BPh with LUTS, nocturia -We will continue rapaflo 8mg  daily and finasteride 5mg . Once the patient is cleared to stop his eliquis we will proceed with Urolift placement. I will have him followup in Jan 2022.  No follow-ups on file.  Nicolette Bang, MD  [facility] [place]

## 2020-06-30 NOTE — Patient Instructions (Signed)

## 2020-06-30 NOTE — Progress Notes (Signed)
Urological Symptom Review  Patient is experiencing the following symptoms: Leakage of urine Trouble starting stream Weak stream Erection problems (male only)   Review of Systems  Gastrointestinal (upper)  : Indigestion/heartburn  Gastrointestinal (lower) : Constipation  Constitutional : Negative for symptoms  Skin: Negative for skin symptoms  Eyes: Negative for eye symptoms  Ear/Nose/Throat : Negative for Ear/Nose/Throat symptoms  Hematologic/Lymphatic: Easy bruising  Cardiovascular : Negative for cardiovascular symptoms  Respiratory : Negative for respiratory symptoms  Endocrine: Negative for endocrine symptoms  Musculoskeletal: Back pain Joint pain  Neurological: Negative for neurological symptoms  Psychologic: Negative for psychiatric symptoms

## 2020-07-15 DIAGNOSIS — Z9689 Presence of other specified functional implants: Secondary | ICD-10-CM | POA: Diagnosis not present

## 2020-07-15 DIAGNOSIS — M961 Postlaminectomy syndrome, not elsewhere classified: Secondary | ICD-10-CM | POA: Diagnosis not present

## 2020-07-15 DIAGNOSIS — M542 Cervicalgia: Secondary | ICD-10-CM | POA: Diagnosis not present

## 2020-07-18 ENCOUNTER — Telehealth: Payer: Self-pay | Admitting: Urology

## 2020-07-18 NOTE — Telephone Encounter (Signed)
Pt called with a medication question.

## 2020-07-19 DIAGNOSIS — J069 Acute upper respiratory infection, unspecified: Secondary | ICD-10-CM | POA: Diagnosis not present

## 2020-07-19 NOTE — Telephone Encounter (Signed)
Patient called stating that the Rapaflo is too expensive for him is there another alternative for him to try? Please advise?

## 2020-07-21 DIAGNOSIS — D519 Vitamin B12 deficiency anemia, unspecified: Secondary | ICD-10-CM | POA: Diagnosis not present

## 2020-07-21 DIAGNOSIS — I4891 Unspecified atrial fibrillation: Secondary | ICD-10-CM | POA: Diagnosis not present

## 2020-07-21 DIAGNOSIS — D509 Iron deficiency anemia, unspecified: Secondary | ICD-10-CM | POA: Diagnosis not present

## 2020-07-21 DIAGNOSIS — E782 Mixed hyperlipidemia: Secondary | ICD-10-CM | POA: Diagnosis not present

## 2020-07-21 DIAGNOSIS — K219 Gastro-esophageal reflux disease without esophagitis: Secondary | ICD-10-CM | POA: Diagnosis not present

## 2020-07-22 ENCOUNTER — Ambulatory Visit (HOSPITAL_COMMUNITY)
Admission: RE | Admit: 2020-07-22 | Discharge: 2020-07-22 | Disposition: A | Discharge status: Home / Self Care | Payer: Medicare Other | Source: Ambulatory Visit | Attending: Internal Medicine | Admitting: Internal Medicine

## 2020-07-22 ENCOUNTER — Other Ambulatory Visit (HOSPITAL_COMMUNITY): Payer: Self-pay | Admitting: Internal Medicine

## 2020-07-22 ENCOUNTER — Other Ambulatory Visit: Payer: Self-pay

## 2020-07-22 DIAGNOSIS — R05 Cough: Secondary | ICD-10-CM | POA: Insufficient documentation

## 2020-07-22 DIAGNOSIS — R059 Cough, unspecified: Secondary | ICD-10-CM

## 2020-07-22 DIAGNOSIS — J029 Acute pharyngitis, unspecified: Secondary | ICD-10-CM | POA: Diagnosis not present

## 2020-07-23 DIAGNOSIS — G4733 Obstructive sleep apnea (adult) (pediatric): Secondary | ICD-10-CM | POA: Diagnosis not present

## 2020-07-26 ENCOUNTER — Other Ambulatory Visit: Payer: Self-pay

## 2020-07-26 DIAGNOSIS — N401 Enlarged prostate with lower urinary tract symptoms: Secondary | ICD-10-CM

## 2020-07-26 DIAGNOSIS — N138 Other obstructive and reflux uropathy: Secondary | ICD-10-CM

## 2020-07-26 MED ORDER — ALFUZOSIN HCL ER 10 MG PO TB24
10.0000 mg | ORAL_TABLET | Freq: Every day | ORAL | 11 refills | Status: DC
Start: 2020-07-26 — End: 2021-08-22

## 2020-07-26 NOTE — Telephone Encounter (Signed)
Please send in uroxatral 10mg  qhs

## 2020-07-26 NOTE — Telephone Encounter (Signed)
New rx sent in to [facility].

## 2020-08-23 ENCOUNTER — Encounter (INDEPENDENT_AMBULATORY_CARE_PROVIDER_SITE_OTHER): Payer: Self-pay | Admitting: Internal Medicine

## 2020-08-23 ENCOUNTER — Encounter (INDEPENDENT_AMBULATORY_CARE_PROVIDER_SITE_OTHER): Payer: Self-pay | Admitting: *Deleted

## 2020-08-23 ENCOUNTER — Other Ambulatory Visit: Payer: Self-pay

## 2020-08-23 ENCOUNTER — Ambulatory Visit (INDEPENDENT_AMBULATORY_CARE_PROVIDER_SITE_OTHER): Payer: Medicare Other | Admitting: Internal Medicine

## 2020-08-23 VITALS — BP 143/67 | HR 46 | Temp 97.7°F | Ht 68.0 in | Wt 172.7 lb

## 2020-08-23 DIAGNOSIS — K5900 Constipation, unspecified: Secondary | ICD-10-CM | POA: Insufficient documentation

## 2020-08-23 DIAGNOSIS — K5903 Drug induced constipation: Secondary | ICD-10-CM

## 2020-08-23 DIAGNOSIS — R140 Abdominal distension (gaseous): Secondary | ICD-10-CM

## 2020-08-23 DIAGNOSIS — K219 Gastro-esophageal reflux disease without esophagitis: Secondary | ICD-10-CM

## 2020-08-23 DIAGNOSIS — R5383 Other fatigue: Secondary | ICD-10-CM

## 2020-08-23 DIAGNOSIS — Z9689 Presence of other specified functional implants: Secondary | ICD-10-CM | POA: Diagnosis not present

## 2020-08-23 DIAGNOSIS — M961 Postlaminectomy syndrome, not elsewhere classified: Secondary | ICD-10-CM | POA: Diagnosis not present

## 2020-08-23 DIAGNOSIS — M542 Cervicalgia: Secondary | ICD-10-CM | POA: Diagnosis not present

## 2020-08-23 DIAGNOSIS — G4733 Obstructive sleep apnea (adult) (pediatric): Secondary | ICD-10-CM | POA: Diagnosis not present

## 2020-08-23 DIAGNOSIS — Z79899 Other long term (current) drug therapy: Secondary | ICD-10-CM | POA: Diagnosis not present

## 2020-08-23 MED ORDER — POLYETHYLENE GLYCOL 3350 17 GM/SCOOP PO POWD: 8.5000 g | Freq: Every day | ORAL | 0 refills | Status: DC

## 2020-08-23 NOTE — Progress Notes (Signed)
Presenting complaint;  Follow-up for chronic GERD. Patient complains of bloating abdominal distention and fatigue.  Database and subjective:  Patient is 74 year old Caucasian male with history of chronic GERD complicated by short segment Barrett's esophagus as well as colonic polyps who underwent EGD and colonoscopy in June 2019.   Esophageal biopsy however was negative for Barrett's and revealed mild nonbleeding GAVE.  Colonoscopy revealed sigmoid diverticulosis and external hemorrhoids but no polyps. He was last seen 1 year ago.  He states he is doing well as far as heartburn is concerned.  He has heartburn only when he eats spicy foods or spaghetti.  He also complains of dysphagia but only when he swallows pills and he tries to swallow multiple at the same time.  He has no difficulty with solids.  He denies chronic cough hoarseness or lump in his throat.  He says his appetite is fair.  He has gained 10 pounds in last 1 year.  He complains of abdominal bloating and swelling which started 2 to 3 months ago.  He has been using Gas-X every day which may help some.  He says since he has developed abdominal swelling it is hard for him to bend or stoop.  It causes him to be short of breath.  He denies chest pain.  He is not used nitroglycerin ever.  He also complains of constipation.  He can go 5 to 6 days without a bowel movement.  He then has daily stools for few days and then is constipated again.  He denies abdominal pain melena or rectal bleeding nausea or vomiting.  He feels cold all the time.  No history of fever or night sweats.  He complains of feeling tired and having no energy. He has chronic neck back and knee pain and takes pain pill 3-4 times a day.  Current Medications: Outpatient Encounter Medications as of 08/23/2020  Medication Sig  . alfuzosin (UROXATRAL) 10 MG 24 hr tablet Take 1 tablet (10 mg total) by mouth daily with breakfast.  . ALPRAZolam (XANAX) 1 MG tablet Take 1 mg by mouth  at bedtime.  [name] amiodarone (PACERONE) 200 MG tablet TAKE 1 TABLET BY MOUTH EVERY DAY  . apixaban (ELIQUIS) 5 MG TABS tablet Take 1 tablet (5 mg total) by mouth 2 (two) times daily.  [name] aspirin EC 81 MG tablet Take 1 tablet (81 mg total) by mouth daily.  [name] atorvastatin (LIPITOR) 80 MG tablet TAKE 1 TABLET BY MOUTH EVERY DAY  . Calcium Polycarbophil (FIBER-CAPS PO) Take 2 capsules by mouth daily.  . carvedilol (COREG) 3.125 MG tablet Take 1 tablet (3.125 mg total) by mouth 2 (two) times daily with a meal.  . Cholecalciferol (VITAMIN D3) 10 MCG (400 UNIT) CAPS Take 400 Units by mouth daily.  [name] docusate sodium (COLACE) 250 MG capsule Take 250 mg by mouth daily.  . finasteride (PROSCAR) 5 MG tablet Take 1 tablet (5 mg total) by mouth daily.  . furosemide (LASIX) 20 MG tablet TAKE 1 TABLET BY MOUTH EVERY DAY  . gabapentin (NEURONTIN) 400 MG capsule One three times a day (Patient taking differently: Take 400 mg by mouth 3 (three) times daily. One three times a day)  . HYDROcodone-acetaminophen (NORCO) 10-325 MG tablet Take 1 tablet by mouth every 8 (eight) hours as needed for moderate pain.   . metoprolol tartrate (LOPRESSOR) 25 MG tablet Take 25 mg by mouth 2 (two) times daily.  . nitroGLYCERIN (NITROSTAT) 0.4 MG SL tablet Place 1 tablet (0.4 mg  total) under the tongue every 5 (five) minutes as needed for chest pain.  . Omega-3 1000 MG CAPS Take by mouth.  . pantoprazole (PROTONIX) 40 MG tablet TAKE 1 TABLET BY MOUTH TWICE A DAY (30 TO 60 MINUTES BEFORE 1ST AND LAST MEALS OF THE DAY)  . potassium chloride (KLOR-CON) 10 MEQ tablet Take by mouth.  . Simethicone 180 MG CAPS Take 1 capsule (180 mg total) by mouth 3 (three) times daily as needed.  . tamsulosin (FLOMAX) 0.4 MG CAPS capsule Take 0.4 mg by mouth at bedtime.  . vitamin E 200 UNIT capsule Take 200 Units by mouth daily.  . benzonatate (TESSALON) 100 MG capsule TAKE 1 2 CAPSULES BY MOUTH EVERY 8 HOURS AS NEEDED FOR COUGH (Patient not taking:  Reported on 08/23/2020)   No facility-administered encounter medications on file as of 08/23/2020.     Objective: Blood pressure (!) 143/67, pulse (!) 46, temperature 97.7 F (36.5 C), temperature source Oral, height _0  (1.727 m), weight 172 lb 11.2 oz (78.3 kg). Patient is alert and in no acute distress. He is wearing a mask. Conjunctiva is pink. Sclera is nonicteric Oropharyngeal mucosa is normal. He has neck scar. No neck masses or thyromegaly noted. Midsternal scar. Cardiac exam with regular rhythm normal S1 and S2. No murmur or gallop noted. Lungs are clear to auscultation. Abdomen is protuberant.  He has small umbilical hernia which is reducible.  On palpation abdomen is soft and nontender.  Flanks are are full but there is no shifting dullness. No LE edema or clubbing noted.  Labs/studies Results:  CBC Latest Ref Rng & Units 02/11/2019 12/15/2018 05/30/2018  WBC 3.4 - 10.8 x10E3/uL 5.6 5.0 -  Hemoglobin 13.0 - 17.7 g/dL 13.2 13.4 13.6  Hematocrit 37.5 - 51.0 % 38.5 39.8 40.0  Platelets 150 - 450 x10E3/uL 343 309.0 -    CMP Latest Ref Rng & Units 02/11/2019 12/15/2018 05/30/2018  Glucose 65 - 99 mg/dL 94 86 109(H)  BUN 8 - 27 mg/dL _1 Creatinine 0.76 - 1.27 mg/dL 0.82 0.97 0.80  Sodium 134 - 144 mmol/L 145(H) 139 140  Potassium 3.5 - 5.2 mmol/L 4.1 4.2 3.7  Chloride 96 - 106 mmol/L 101 104 105  CO2 20 - 29 mmol/L 26 29 -  Calcium 8.6 - 10.2 mg/dL 9.6 9.5 -  Total Protein 6.5 - 8.1 g/dL - - -  Total Bilirubin 0.3 - 1.2 mg/dL - - -  Alkaline Phos 38 - 126 U/L - - -  AST 15 - 41 U/L - - -  ALT 17 - 63 U/L - - -    Hepatic Function Latest Ref Rng & Units 12/06/2017 11/04/2017 06/07/2017  Total Protein 6.5 - 8.1 g/dL 6.4(L) 7.0 6.6  Albumin 3.5 - 5.0 g/dL 4.0 4.3 4.2  AST 15 - 41 U/L _2 ALT 17 - 63 U/L _3 Alk Phosphatase 38 - 126 U/L 65 67 63  Total Bilirubin 0.3 - 1.2 mg/dL 1.2 0.7 0.7  Bilirubin, Direct 0.1 - 0.5 mg/dL - - -      Assessment:  #1.  Chronic GERD.  He has history of short segment Barrett's esophagus but biopsies in June 2019 were negative.  Heartburn is well controlled with PPI.  #2.  Bloating and abdominal swelling.  He has gained 10 pounds since last visit.  On exam I am not convinced that he has ascites.  This nevertheless needs to be ruled out.  #  3.  Constipation appears to be secondary to narcotic therapy.  Stool softener is not working.  We will try him on polyethylene glycol before considering other treatment options.  #4.  Fatigue and tiredness.  Will check hemoglobin and TSH.  The symptoms may be secondary to his medications.   Plan:  Patient to go to the lab for CBC and TSH. Abdominal ultrasound. Polyethylene glycol half to 1 scoop daily or every other day.  If polyethylene glycol works he can stop stool softener. I will be contacting patient results of ultrasound and blood work and with further recommendations. Next office visit to be determined depending on results of pending studies.

## 2020-08-23 NOTE — Patient Instructions (Addendum)
Physician will call with results of blood work and ultrasound when completed. Can stop stool softener if polyethylene glycol works.

## 2020-08-24 LAB — CBC
HCT: 40.6 % (ref 38.5–50.0)
Hemoglobin: 13.3 g/dL (ref 13.2–17.1)
MCH: 30.6 pg (ref 27.0–33.0)
MCHC: 32.8 g/dL (ref 32.0–36.0)
MCV: 93.5 fL (ref 80.0–100.0)
MPV: 9.6 fL (ref 7.5–12.5)
Platelets: 254 10*3/uL (ref 140–400)
RBC: 4.34 10*6/uL (ref 4.20–5.80)
RDW: 12.9 % (ref 11.0–15.0)
WBC: 5.4 10*3/uL (ref 3.8–10.8)

## 2020-08-24 LAB — TSH: TSH: 2.4 mIU/L (ref 0.40–4.50)

## 2020-08-29 ENCOUNTER — Other Ambulatory Visit: Payer: Self-pay

## 2020-08-29 ENCOUNTER — Ambulatory Visit (HOSPITAL_COMMUNITY)
Admission: RE | Admit: 2020-08-29 | Discharge: 2020-08-29 | Disposition: A | Discharge status: Home / Self Care | Payer: Medicare Other | Source: Ambulatory Visit | Attending: Internal Medicine | Admitting: Internal Medicine

## 2020-08-29 DIAGNOSIS — K5903 Drug induced constipation: Secondary | ICD-10-CM | POA: Insufficient documentation

## 2020-08-29 DIAGNOSIS — R140 Abdominal distension (gaseous): Secondary | ICD-10-CM | POA: Diagnosis not present

## 2020-09-05 DIAGNOSIS — D509 Iron deficiency anemia, unspecified: Secondary | ICD-10-CM | POA: Diagnosis not present

## 2020-09-05 DIAGNOSIS — K219 Gastro-esophageal reflux disease without esophagitis: Secondary | ICD-10-CM | POA: Diagnosis not present

## 2020-09-05 DIAGNOSIS — E782 Mixed hyperlipidemia: Secondary | ICD-10-CM | POA: Diagnosis not present

## 2020-09-05 DIAGNOSIS — I4891 Unspecified atrial fibrillation: Secondary | ICD-10-CM | POA: Diagnosis not present

## 2020-09-05 DIAGNOSIS — D519 Vitamin B12 deficiency anemia, unspecified: Secondary | ICD-10-CM | POA: Diagnosis not present

## 2020-09-12 DIAGNOSIS — E559 Vitamin D deficiency, unspecified: Secondary | ICD-10-CM | POA: Diagnosis not present

## 2020-09-12 DIAGNOSIS — E782 Mixed hyperlipidemia: Secondary | ICD-10-CM | POA: Diagnosis not present

## 2020-09-12 DIAGNOSIS — K219 Gastro-esophageal reflux disease without esophagitis: Secondary | ICD-10-CM | POA: Diagnosis not present

## 2020-09-12 DIAGNOSIS — S61411D Laceration without foreign body of right hand, subsequent encounter: Secondary | ICD-10-CM | POA: Diagnosis not present

## 2020-09-12 DIAGNOSIS — R7301 Impaired fasting glucose: Secondary | ICD-10-CM | POA: Diagnosis not present

## 2020-09-12 DIAGNOSIS — I4891 Unspecified atrial fibrillation: Secondary | ICD-10-CM | POA: Diagnosis not present

## 2020-09-12 DIAGNOSIS — D519 Vitamin B12 deficiency anemia, unspecified: Secondary | ICD-10-CM | POA: Diagnosis not present

## 2020-09-14 DIAGNOSIS — E782 Mixed hyperlipidemia: Secondary | ICD-10-CM | POA: Diagnosis not present

## 2020-09-14 DIAGNOSIS — I25119 Atherosclerotic heart disease of native coronary artery with unspecified angina pectoris: Secondary | ICD-10-CM | POA: Diagnosis not present

## 2020-09-14 DIAGNOSIS — K8080 Other cholelithiasis without obstruction: Secondary | ICD-10-CM | POA: Diagnosis not present

## 2020-09-14 DIAGNOSIS — I10 Essential (primary) hypertension: Secondary | ICD-10-CM | POA: Diagnosis not present

## 2020-09-14 DIAGNOSIS — F5101 Primary insomnia: Secondary | ICD-10-CM | POA: Diagnosis not present

## 2020-09-21 NOTE — Progress Notes (Signed)
Cardiology Office Note    Date:  09/26/2020   ID:  Lake, Cinquemani January 01, 1946, MRN [id number]  PCP:  Celene Squibb, MD  Cardiologist: Jenkins Rouge, MD EPS: Cristopher Peru, MD  No chief complaint on file.   History of Present Illness:  Christopher [name] is a 74 y.o. male with history of CAD S/P CABG SVG PDA/OM, LIMA-LAD 09/2019 at [facility] including LAA excision and oversewn.  Postop echo 12/25/2019 LVEF 55 to 60%,PAF on amiodarone & eliquis followed by Dr. Lynda Rainwater use.  Last saw Dr. Lovena Le 03/09/20 and amiodarone cut back, Dr.Nishan 12/21/19.  Patient is sent back by PCP because of worsening DOE. He had some right sided chest pain last night while watching TV after eating and lasted an hour or longer. Had indigestion with it after eating chili beans.  He complains of dyspnea going up the steps. Last night he was short of breath when he laid down and eased after 5 min. Hasn't used CPAP in 3 months because he says he wakes up short of breath with the mask on. Stopped smoking 34 yrs ago. No palpitations. Drinks 2-3 beers nightly. CXR 07/22/20 NAD, labs 09/12/20 SGOT & SGPT mildly elevated.Has gained 20 lbs in 3 months and complains of abdominal swelling. Complains of weakness. Saw GI 08/23/20 Ultrasound cholelithiasis, slightly dilated prox infrarenal abdominal aorta, needs f/u every 5 yrs.    Past Medical History:  Diagnosis Date  . A-fib ([place])   . Arthritis    "all over my body" (06/17/2017)  . BPH (benign prostatic hyperplasia)    takes Proscar daily  . Chronic back pain    DDD; "upper and lower" (06/17/2017)  . Constipation    takes Colace daily  . DDD (degenerative disc disease), cervical   . DVT (deep venous thrombosis) (HCC)    left peroneal vein 07/2017 (in setting of recent left TKA)  . Dysrhythmia    history of Atrial fibrillation, no long has this  . Enlarged prostate   . GERD (gastroesophageal reflux disease)    takes Nexium daily  . H/O hiatal hernia   . History of  colon polyps    benign  . History of gastric ulcer   . Insomnia    takes Xanax nightly  . Joint capsule tear    left knee  . Joint pain   . Joint swelling   . Macular degeneration   . Muscle spasm of back    takes Valium daily as needed  . PAF (paroxysmal atrial fibrillation) ([place])   . Peripheral neuropathy    takes Gabapentin daily  . PONV (postoperative nausea and vomiting)   . Sleep apnea    pt does not wear a cpap  . Urinary hesitancy   . Weakness    numbness and tingling    Past Surgical History:  Procedure Laterality Date  . ANTERIOR CERVICAL DECOMP/DISCECTOMY FUSION  ~ 1991   "took bone from hip"  . ANTERIOR CERVICAL DECOMP/DISCECTOMY FUSION  X 2   "put plate in"  . ANTERIOR CERVICAL DISCECTOMY  <11/2005    5-6 and 4-5./notes 05/02/2011  . APPENDECTOMY    . BACK SURGERY    . BIOPSY  05/30/2018   Procedure: BIOPSY;  Surgeon: [name], MD;  [facility];  Service: Endoscopy;;  esophagus  . CARDIAC CATHETERIZATION    . CARPAL TUNNEL RELEASE Bilateral 10/2005 - 11/2005   right-left Ar[facility] Endo 05/02/2011  . CATARACT EXTRACTION W/PHACO Right 05/24/2014  Procedure: CATARACT EXTRACTION PHACO AND INTRAOCULAR LENS PLACEMENT (IOC);  Surgeon: [name], MD;  Location: [facility];  Service: Ophthalmology;  Laterality: Right;  CDE 6.74  . CATARACT EXTRACTION W/PHACO Left 06/17/2014   Procedure: CATARACT EXTRACTION PHACO AND INTRAOCULAR LENS PLACEMENT (IOC);  Surgeon: [name], MD;  Location: [facility];  Service: Ophthalmology;  Laterality: Left;  CDE:4.65  . COLONOSCOPY WITH ESOPHAGOGASTRODUODENOSCOPY (EGD) N/A 01/22/2013   Procedure: COLONOSCOPY WITH ESOPHAGOGASTRODUODENOSCOPY (EGD);  Surgeon: [name], MD;  [facility];  Service: Endoscopy;  Laterality: N/A;  140  . COLONOSCOPY WITH PROPOFOL N/A 05/30/2018   Procedure: COLONOSCOPY WITH PROPOFOL;  Surgeon: [name], MD;  [facility];  Service: Endoscopy;  Laterality: N/A;  .  ESOPHAGOGASTRODUODENOSCOPY (EGD) WITH PROPOFOL N/A 05/30/2018   Procedure: ESOPHAGOGASTRODUODENOSCOPY (EGD) WITH PROPOFOL;  Surgeon: [name], MD;  [facility];  Service: Endoscopy;  Laterality: N/A;  . EYE SURGERY Bilateral    "cleaned film w/laser; since my 1st cataract OR"  . JOINT REPLACEMENT    . LEFT HEART CATH AND CORONARY ANGIOGRAPHY N/A 02/16/2019   Procedure: LEFT HEART CATH AND CORONARY ANGIOGRAPHY;  Surgeon: Belva Crome, MD;  Location: [place] [facility];  Service: Cardiovascular;  Laterality: N/A;  . POSTERIOR LUMBAR FUSION     "put a plate in"  . QUADRICEPS TENDON REPAIR Left 07/01/2017  . QUADRICEPS TENDON REPAIR Left 07/01/2017   Procedure: REPAIR QUADRICEP TENDON;  Surgeon: [name], MD;  Location: [place];  Service: Orthopedics;  Laterality: Left;  . SHOULDER ARTHROSCOPY Bilateral   . SPINAL CORD STIMULATOR INSERTION N/A 09/23/2015   Procedure: LUMBAR SPINAL CORD STIMULATOR INSERTION;  Surgeon: [name], MD;  Location: [place] [facility];  Service: Neurosurgery;  Laterality: N/A;  LUMBAR SPINAL CORD STIMULATOR INSERTION  . TOTAL KNEE ARTHROPLASTY Left 06/17/2017  . TOTAL KNEE ARTHROPLASTY Left 06/17/2017   Procedure: TOTAL KNEE ARTHROPLASTY;  Surgeon: [name], MD;  Location: [place];  Service: Orthopedics;  Laterality: Left;  . TOTAL KNEE ARTHROPLASTY Right 12/16/2017   Procedure: TOTAL KNEE ARTHROPLASTY;  Surgeon: [name], MD;  Location: [facility];  Service: Orthopedics;  Laterality: Right;    Current Medications: Current Meds  Medication Sig  . alfuzosin (UROXATRAL) 10 MG 24 hr tablet Take 1 tablet (10 mg total) by mouth daily with breakfast.  . ALPRAZolam (XANAX) 1 MG tablet Take 1 mg by mouth at bedtime.  [name] amiodarone (PACERONE) 200 MG tablet TAKE 1 TABLET BY MOUTH EVERY DAY  . apixaban (ELIQUIS) 5 MG TABS tablet Take 1 tablet (5 mg total) by mouth 2 (two) times daily.  [name] aspirin EC 81 MG tablet Take 1 tablet (81 mg total) by mouth daily.  [name]  atorvastatin (LIPITOR) 80 MG tablet TAKE 1 TABLET BY MOUTH EVERY DAY  . Calcium Polycarbophil (FIBER-CAPS PO) Take 2 capsules by mouth daily.  . Cholecalciferol (VITAMIN D3) 10 MCG (400 UNIT) CAPS Take 400 Units by mouth daily.  . Cyanocobalamin (B-12 PO) Take by mouth daily.  [name] docusate sodium (COLACE) 250 MG capsule Take 250 mg by mouth daily.  . finasteride (PROSCAR) 5 MG tablet Take 1 tablet (5 mg total) by mouth daily.  . furosemide (LASIX) 20 MG tablet TAKE 1 TABLET BY MOUTH EVERY DAY  . gabapentin (NEURONTIN) 400 MG capsule One three times a day (Patient taking differently: Take 400 mg by mouth 3 (three) times daily. One three times a day)  . HYDROcodone-acetaminophen (NORCO) 10-325 MG tablet Take  1 tablet by mouth every 8 (eight) hours as needed for moderate pain.   . nitroGLYCERIN (NITROSTAT) 0.4 MG SL tablet Place 1 tablet (0.4 mg total) under the tongue every 5 (five) minutes as needed for chest pain.  . Omega-3 1000 MG CAPS Take by mouth.  . pantoprazole (PROTONIX) 40 MG tablet TAKE 1 TABLET BY MOUTH TWICE A DAY (30 TO 60 MINUTES BEFORE 1ST AND LAST MEALS OF THE DAY)  . polyethylene glycol powder (GLYCOLAX/MIRALAX) 17 GM/SCOOP powder Take 8.5 g by mouth daily.  . potassium chloride (KLOR-CON) 10 MEQ tablet Take by mouth.  . Simethicone 180 MG CAPS Take 1 capsule (180 mg total) by mouth 3 (three) times daily as needed.  . tamsulosin (FLOMAX) 0.4 MG CAPS capsule Take 0.4 mg by mouth at bedtime.  . vitamin E 200 UNIT capsule Take 200 Units by mouth daily.  . [DISCONTINUED] benzonatate (TESSALON) 100 MG capsule TAKE 1 2 CAPSULES BY MOUTH EVERY 8 HOURS AS NEEDED FOR COUGH  . [DISCONTINUED] carvedilol (COREG) 3.125 MG tablet Take 1 tablet (3.125 mg total) by mouth 2 (two) times daily with a meal.  . [DISCONTINUED] metoprolol tartrate (LOPRESSOR) 25 MG tablet Take 25 mg by mouth 2 (two) times daily.     Allergies:   Morphine, Tylenol [acetaminophen], Morphine and related, and Propofol    Social History   Socioeconomic History  . Marital status: Single    Spouse name: Not on file  . Number of children: Not on file  . Years of education: Not on file  . Highest education level: Not on file  Occupational History  . Not on file  Tobacco Use  . Smoking status: Former Smoker    Packs/day: 1.00    Years: 30.00    Pack years: 30.00    Types: Cigarettes    Quit date: 01/14/1987    Years since quitting: 33.7  . Smokeless tobacco: Former Systems developer    Types: Chew  . Tobacco comment: quit smoking  & chewing by 1987  Vaping Use  . Vaping Use: Never used  Substance and Sexual Activity  . Alcohol use: Yes    Alcohol/week: 15.0 standard drinks    Types: 3 Glasses of wine, 12 Cans of beer per week    Comment: 2-3 times weekly   . Drug use: No  . Sexual activity: Not Currently  Other Topics Concern  . Not on file  Social History Narrative  . Not on file   Social Determinants of Health   Financial Resource Strain:   . Difficulty of Paying Living Expenses: Not on file  Food Insecurity:   . Worried About Charity fundraiser in the Last Year: Not on file  . Ran Out of Food in the Last Year: Not on file  Transportation Needs:   . Lack of Transportation (Medical): Not on file  . Lack of Transportation (Non-Medical): Not on file  Physical Activity:   . Days of Exercise per Week: Not on file  . Minutes of Exercise per Session: Not on file  Stress:   . Feeling of Stress : Not on file  Social Connections:   . Frequency of Communication with Friends and Family: Not on file  . Frequency of Social Gatherings with Friends and Family: Not on file  . Attends Religious Services: Not on file  . Active Member of Clubs or Organizations: Not on file  . Attends Archivist Meetings: Not on file  . Marital Status: Not on file  Family History:  The patient's family history is not on file.   ROS:   Please see the history of present illness.    ROS All other systems  reviewed and are negative.   PHYSICAL EXAM:   VS:  BP (!) 110/56   Pulse (!) 50   Ht 5\' 8"  (1.727 m)   Wt 174 lb (78.9 kg)   SpO2 96%   BMI 26.46 kg/m   Physical Exam  GEN: Well nourished, well developed, in no acute distress  HEENT: normal  Neck: no JVD, carotid bruits, or masses Cardiac:RRR; no murmurs, rubs, or gallops  Respiratory:  clear to auscultation bilaterally, normal work of breathing GI: soft, nontender, nondistended, + BS Ext: without cyanosis, clubbing, or edema, Good distal pulses bilaterally MS: no deformity or atrophy  Skin: warm and dry, no rash Neuro:  Alert and Oriented x 3, Strength and sensation are intact Psych: euthymic mood, full affect  Wt Readings from Last 3 Encounters:  09/26/20 174 lb (78.9 kg)  08/23/20 172 lb 11.2 oz (78.3 kg)  06/30/20 162 lb (73.5 kg)      Studies/Labs Reviewed:   EKG:  EKG is ordered today.  The ekg ordered today demonstrates sinus bradycardia at 48 bpm with first-degree AV block  Recent Labs: 08/23/2020: Hemoglobin 13.3; Platelets 254; TSH 2.40   Lipid Panel    Component Value Date/Time   CHOL 193 11/04/2017 1120   TRIG 72 11/04/2017 1120   HDL 52 11/04/2017 1120   CHOLHDL 3.7 11/04/2017 1120   VLDL 14 11/04/2017 1120   LDLCALC 127 (H) 11/04/2017 1120    Additional studies/ records that were reviewed today include:  2D echo 12/25/2019 IMPRESSIONS     1. Left ventricular ejection fraction, by visual estimation, is 55 to  60%. The left ventricle has normal function. There is no left ventricular  hypertrophy.   2. The left ventricle has no regional wall motion abnormalities.   3. Global right ventricle has normal systolic function.The right  ventricular size is normal. No increase in right ventricular wall  thickness.   4. Left atrial size was normal.   5. Right atrial size was normal.   6. The mitral valve is normal in structure. No evidence of mitral valve  regurgitation. No evidence of mitral stenosis.    7. The tricuspid valve is normal in structure.   8. The aortic valve is normal in structure. Aortic valve regurgitation is  not visualized. No evidence of aortic valve sclerosis or stenosis.   9. The pulmonic valve was normal in structure. Pulmonic valve  regurgitation is not visualized.  10. Normal pulmonary artery systolic pressure.  11. The inferior vena cava is normal in size with greater than 50%  respiratory variability, suggesting right atrial pressure of 3 mmHg.   FINDINGS   Left Ventricle: Left ventricular ejection fraction, by visual estimation,  is 55 to 60%. The left ventricle has normal function. The left ventricle  has no regional wall motion abnormalities. There is no left ventricular  hypertrophy. Left ventricular  diastolic parameters were normal. Normal left atrial pressure.   Right Ventricle: The right ventricular size is normal. No increase in  right ventricular wall thickness. Global RV systolic function is has  normal systolic function. The tricuspid regurgitant velocity is 2.17 m/s,  and with an assumed right atrial pressure   of 3 mmHg, the estimated right ventricular systolic pressure is normal at  21.8 mmHg.   Left Atrium: Left atrial size was  normal in size.   Right Atrium: Right atrial size was normal in size   Pericardium: There is no evidence of pericardial effusion.   Mitral Valve: The mitral valve is normal in structure. No evidence of  mitral valve regurgitation. No evidence of mitral valve stenosis by  observation.   Tricuspid Valve: The tricuspid valve is normal in structure. Tricuspid  valve regurgitation is not demonstrated.   Aortic Valve: The aortic valve is normal in structure. Aortic valve  regurgitation is not visualized. The aortic valve is structurally normal,  with no evidence of sclerosis or stenosis.   Pulmonic Valve: The pulmonic valve was normal in structure. Pulmonic valve  regurgitation is not visualized. Pulmonic  regurgitation is not visualized.   Aorta: The aortic root, ascending aorta and aortic arch are all  structurally normal, with no evidence of dilitation or obstruction.   Venous: The inferior vena cava is normal in size with greater than 50%  respiratory variability, suggesting right atrial pressure of 3 mmHg.   IAS/Shunts: No atrial level shunt detected by color flow Doppler. There is  no evidence of a patent foramen ovale. No ventricular septal defect is  seen or detected. There is no evidence of an atrial septal defect.         ASSESSMENT:    1. DOE (dyspnea on exertion)   2. Coronary artery disease involving coronary bypass graft of native heart without angina pectoris   3. Paroxysmal atrial fibrillation (HCC)   4. Essential hypertension   5. Hyperlipidemia, unspecified hyperlipidemia type   6. ETOH abuse   7. OSA (obstructive sleep apnea)      PLAN:  In order of problems listed above:  DOE  And weakness, trouble going upstairs. Not using CPAP.  Medicine list that he is taking amiodarone, carvedilol and metoprolol.  Calling pharmacy to confirm.  Saw Dr. Melvyn Novas in the past for emphysema.  Chest x-ray 07/2020 no active disease.  Bradycardia most likely contributing to this  CAD S/P CABG SVG PDA/OM, LIMA-LAD 09/2019 at [facility] including LAA excision and oversewn.  Postop echo 12/25/2019 LVEF 55 to 60%.  No angina  PAF on amiodarone & eliquis followed by Dr. Lovena Le who said he consider decreasing his amiodarone next year.  HTN blood pressure controlled  HLD on Lipitor  ETOH use drinks 2-3 beers daily  OSA not using CPAP because of mask trouble.  Will refer back to pulmonary  Medication Adjustments/Labs and Tests Ordered: Current medicines are reviewed at length with the patient today.  Concerns regarding medicines are outlined above.  Medication changes, Labs and Tests ordered today are listed in the Patient Instructions below. Patient Instructions  Medication  Instructions:  STOP metoprolol  Make sure you don't take Carvedilol (Coreg) at home.  Check your pill bottle.  *If you need a refill on your cardiac medications before your next appointment, please call your pharmacy*   Lab Work: None today If you have labs (blood work) drawn today and your tests are completely normal, you will receive your results only by: [name] MyChart Message (if you have MyChart) OR . A paper copy in the mail If you have any lab test that is abnormal or we need to change your treatment, we will call you to review the results.   Testing/Procedures: None today   Follow-Up: At [facility], you and your health needs are our priority.  As part of our continuing mission to provide you with exceptional heart care, we have created designated  Provider Care Teams.  These Care Teams include your primary Cardiologist (physician) and Advanced Practice Providers (APPs -  Physician Assistants and Nurse Practitioners) who all work together to provide you with the care you need, when you need it.  We recommend signing up for the patient portal called "MyChart".  Sign up information is provided on this After Visit Summary.  MyChart is used to connect with patients for Virtual Visits (Telemedicine).  Patients are able to view lab/test results, encounter notes, upcoming appointments, etc.  Non-urgent messages can be sent to your provider as well.   To learn more about what you can do with MyChart, go to [email or web address].    Your next appointment:   2 week(s)  The format for your next appointment:   In Person  Provider:   Ermalinda Barrios, PA-C   Other Instructions Schedule a follow up appointment with dr.taylor     Thank you for choosing [facility] !            Sumner Boast, PA-C  09/26/2020 12:19 PM    [facility] [address], [place], [place]  [address] Phone: [phone]; Fax: [phone]

## 2020-09-26 ENCOUNTER — Ambulatory Visit: Payer: Medicare Other | Admitting: Physician Assistant

## 2020-09-26 ENCOUNTER — Encounter: Payer: Self-pay | Admitting: Physician Assistant

## 2020-09-26 ENCOUNTER — Other Ambulatory Visit: Payer: Self-pay

## 2020-09-26 VITALS — BP 110/56 | HR 50 | Ht 68.0 in | Wt 174.0 lb

## 2020-09-26 DIAGNOSIS — F1010 Alcohol abuse, uncomplicated: Secondary | ICD-10-CM

## 2020-09-26 DIAGNOSIS — R0600 Dyspnea, unspecified: Secondary | ICD-10-CM | POA: Diagnosis not present

## 2020-09-26 DIAGNOSIS — I25810 Atherosclerosis of coronary artery bypass graft(s) without angina pectoris: Secondary | ICD-10-CM

## 2020-09-26 DIAGNOSIS — I10 Essential (primary) hypertension: Secondary | ICD-10-CM

## 2020-09-26 DIAGNOSIS — E785 Hyperlipidemia, unspecified: Secondary | ICD-10-CM

## 2020-09-26 DIAGNOSIS — I480 Paroxysmal atrial fibrillation: Secondary | ICD-10-CM

## 2020-09-26 DIAGNOSIS — G4733 Obstructive sleep apnea (adult) (pediatric): Secondary | ICD-10-CM

## 2020-09-26 DIAGNOSIS — R0609 Other forms of dyspnea: Secondary | ICD-10-CM

## 2020-09-26 NOTE — Patient Instructions (Signed)
Medication Instructions:  STOP metoprolol  Make sure you don't take Carvedilol (Coreg) at home.  Check your pill bottle.  *If you need a refill on your cardiac medications before your next appointment, please call your pharmacy*   Lab Work: None today If you have labs (blood work) drawn today and your tests are completely normal, you will receive your results only by: [name] MyChart Message (if you have MyChart) OR . A paper copy in the mail If you have any lab test that is abnormal or we need to change your treatment, we will call you to review the results.   Testing/Procedures: None today   Follow-Up: At [facility], you and your health needs are our priority.  As part of our continuing mission to provide you with exceptional heart care, we have created designated Provider Care Teams.  These Care Teams include your primary Cardiologist (physician) and Advanced Practice Providers (APPs -  Physician Assistants and Nurse Practitioners) who all work together to provide you with the care you need, when you need it.  We recommend signing up for the patient portal called "MyChart".  Sign up information is provided on this After Visit Summary.  MyChart is used to connect with patients for Virtual Visits (Telemedicine).  Patients are able to view lab/test results, encounter notes, upcoming appointments, etc.  Non-urgent messages can be sent to your provider as well.   To learn more about what you can do with MyChart, go to [email or web address].    Your next appointment:   2 week(s)  The format for your next appointment:   In Person  Provider:   Ermalinda Barrios, PA-C   Other Instructions Schedule a follow up appointment with dr.taylor     Thank you for choosing [facility] !

## 2020-10-20 ENCOUNTER — Ambulatory Visit: Payer: Medicare Other | Admitting: Internal Medicine

## 2020-10-20 ENCOUNTER — Encounter: Payer: Self-pay | Admitting: Internal Medicine

## 2020-10-20 ENCOUNTER — Other Ambulatory Visit: Payer: Self-pay

## 2020-10-20 ENCOUNTER — Other Ambulatory Visit (HOSPITAL_COMMUNITY)
Admission: RE | Admit: 2020-10-20 | Discharge: 2020-10-20 | Disposition: A | Discharge status: Home / Self Care | Payer: Medicare Other | Source: Ambulatory Visit | Attending: Internal Medicine | Admitting: Internal Medicine

## 2020-10-20 VITALS — BP 122/72 | HR 52 | Ht 68.0 in | Wt 175.2 lb

## 2020-10-20 DIAGNOSIS — I4891 Unspecified atrial fibrillation: Secondary | ICD-10-CM

## 2020-10-20 DIAGNOSIS — Z79899 Other long term (current) drug therapy: Secondary | ICD-10-CM | POA: Diagnosis not present

## 2020-10-20 DIAGNOSIS — Z9989 Dependence on other enabling machines and devices: Secondary | ICD-10-CM

## 2020-10-20 LAB — TSH: TSH: 2.136 u[IU]/mL (ref 0.350–4.500)

## 2020-10-20 LAB — SEDIMENTATION RATE: Sed Rate: 5 mm/hr (ref 0–16)

## 2020-10-20 NOTE — Progress Notes (Signed)
HPI Christopher [name] returns for followup. He is a pleasant 75 yo man with CAD, s/p CABG, HTN, PAF, on amiodarone who has c/o problems with worsening sob. He has not had syncope or palpitations. He was bradycardic. He has sleep apnea but was intolerant and is not using his CPAP. No chest pain. No edema. He has gained 20 lbs. Allergies  Allergen Reactions  . Morphine Nausea And Vomiting  . Tylenol [Acetaminophen]     Stomach pain   . Morphine And Related Nausea And Vomiting  . Propofol Nausea And Vomiting     Current Outpatient Medications  Medication Sig Dispense Refill  . alfuzosin (UROXATRAL) 10 MG 24 hr tablet Take 1 tablet (10 mg total) by mouth daily with breakfast. 30 tablet 11  . ALPRAZolam (XANAX) 1 MG tablet Take 1 mg by mouth at bedtime.  0  . amiodarone (PACERONE) 200 MG tablet TAKE 1 TABLET BY MOUTH EVERY DAY 90 tablet 3  . apixaban (ELIQUIS) 5 MG TABS tablet Take 1 tablet (5 mg total) by mouth 2 (two) times daily.    [name] aspirin EC 81 MG tablet Take 1 tablet (81 mg total) by mouth daily. 90 tablet 3  . atorvastatin (LIPITOR) 80 MG tablet TAKE 1 TABLET BY MOUTH EVERY DAY 90 tablet 3  . Calcium Polycarbophil (FIBER-CAPS PO) Take 2 capsules by mouth daily.    . Cholecalciferol (VITAMIN D3) 10 MCG (400 UNIT) CAPS Take 400 Units by mouth daily.    . Cyanocobalamin (B-12 PO) Take by mouth daily.    [name] docusate sodium (COLACE) 250 MG capsule Take 250 mg by mouth daily.    . finasteride (PROSCAR) 5 MG tablet Take 1 tablet (5 mg total) by mouth daily. 90 tablet 3  . furosemide (LASIX) 20 MG tablet TAKE 1 TABLET BY MOUTH EVERY DAY 90 tablet 3  . gabapentin (NEURONTIN) 400 MG capsule One three times a day (Patient taking differently: Take 400 mg by mouth 3 (three) times daily. One three times a day)    . HYDROcodone-acetaminophen (NORCO) 10-325 MG tablet Take 1 tablet by mouth every 8 (eight) hours as needed for moderate pain.     . nitroGLYCERIN (NITROSTAT) 0.4 MG SL tablet Place 1  tablet (0.4 mg total) under the tongue every 5 (five) minutes as needed for chest pain. 25 tablet 5  . Omega-3 1000 MG CAPS Take by mouth.    . pantoprazole (PROTONIX) 40 MG tablet TAKE 1 TABLET BY MOUTH TWICE A DAY (30 TO 60 MINUTES BEFORE 1ST AND LAST MEALS OF THE DAY) 180 tablet 0  . polyethylene glycol powder (GLYCOLAX/MIRALAX) 17 GM/SCOOP powder Take 8.5 g by mouth daily. 255 g 0  . potassium chloride (KLOR-CON) 10 MEQ tablet Take by mouth.    . Simethicone 180 MG CAPS Take 1 capsule (180 mg total) by mouth 3 (three) times daily as needed.  0  . tamsulosin (FLOMAX) 0.4 MG CAPS capsule Take 0.4 mg by mouth at bedtime.    . vitamin E 200 UNIT capsule Take 200 Units by mouth daily.     No current facility-administered medications for this visit.     Past Medical History:  Diagnosis Date  . A-fib ([place])   . Arthritis    "all over my body" (06/17/2017)  . BPH (benign prostatic hyperplasia)    takes Proscar daily  . Chronic back pain    DDD; "upper and lower" (06/17/2017)  . Constipation    takes Colace  daily  . DDD (degenerative disc disease), cervical   . DVT (deep venous thrombosis) ([place])    left peroneal vein 07/2017 (in setting of recent left TKA)  . Dysrhythmia    history of Atrial fibrillation, no long has this  . Enlarged prostate   . GERD (gastroesophageal reflux disease)    takes Nexium daily  . H/O hiatal hernia   . History of colon polyps    benign  . History of gastric ulcer   . Insomnia    takes Xanax nightly  . Joint capsule tear    left knee  . Joint pain   . Joint swelling   . Macular degeneration   . Muscle spasm of back    takes Valium daily as needed  . PAF (paroxysmal atrial fibrillation) ([place])   . Peripheral neuropathy    takes Gabapentin daily  . PONV (postoperative nausea and vomiting)   . Sleep apnea    pt does not wear a cpap  . Urinary hesitancy   . Weakness    numbness and tingling    ROS:   All systems reviewed and negative except as  noted in the HPI.   Past Surgical History:  Procedure Laterality Date  . ANTERIOR CERVICAL DECOMP/DISCECTOMY FUSION  ~ 1991   "took bone from hip"  . ANTERIOR CERVICAL DECOMP/DISCECTOMY FUSION  X 2   "put plate in"  . ANTERIOR CERVICAL DISCECTOMY  <11/2005    5-6 and 4-5./notes 05/02/2011  . APPENDECTOMY    . BACK SURGERY    . BIOPSY  05/30/2018   Procedure: BIOPSY;  Surgeon: [name], MD;  [facility]: [facility];  Service: Endoscopy;;  esophagus  . CARDIAC CATHETERIZATION    . CARPAL TUNNEL RELEASE Bilateral 10/2005 - 11/2005   right-left [facility] Endo 05/02/2011  . CATARACT EXTRACTION W/PHACO Right 05/24/2014   Procedure: CATARACT EXTRACTION PHACO AND INTRAOCULAR LENS PLACEMENT (IOC);  Surgeon: [name], MD;  Location: [facility];  Service: Ophthalmology;  Laterality: Right;  CDE 6.74  . CATARACT EXTRACTION W/PHACO Left 06/17/2014   Procedure: CATARACT EXTRACTION PHACO AND INTRAOCULAR LENS PLACEMENT (IOC);  Surgeon: [name], MD;  Location: [facility];  Service: Ophthalmology;  Laterality: Left;  CDE:4.65  . COLONOSCOPY WITH ESOPHAGOGASTRODUODENOSCOPY (EGD) N/A 01/22/2013   Procedure: COLONOSCOPY WITH ESOPHAGOGASTRODUODENOSCOPY (EGD);  Surgeon: [name], MD;  [facility];  Service: Endoscopy;  Laterality: N/A;  140  . COLONOSCOPY WITH PROPOFOL N/A 05/30/2018   Procedure: COLONOSCOPY WITH PROPOFOL;  Surgeon: [name], MD;  [facility];  Service: Endoscopy;  Laterality: N/A;  . ESOPHAGOGASTRODUODENOSCOPY (EGD) WITH PROPOFOL N/A 05/30/2018   Procedure: ESOPHAGOGASTRODUODENOSCOPY (EGD) WITH PROPOFOL;  Surgeon: [name], MD;  [facility];  Service: Endoscopy;  Laterality: N/A;  . EYE SURGERY Bilateral    "cleaned film w/laser; since my 1st cataract OR"  . JOINT REPLACEMENT    . LEFT HEART CATH AND CORONARY ANGIOGRAPHY N/A 02/16/2019   Procedure: LEFT HEART CATH AND CORONARY ANGIOGRAPHY;  Surgeon: Belva Crome, MD;  Location: [facility];  Service: Cardiovascular;  Laterality: N/A;  . POSTERIOR LUMBAR FUSION     "put a plate in"  . QUADRICEPS TENDON REPAIR Left 07/01/2017  . QUADRICEPS TENDON REPAIR Left 07/01/2017   Procedure: REPAIR QUADRICEP TENDON;  Surgeon: [name], MD;  Location: [facility];  Service: Orthopedics;  Laterality: Left;  . SHOULDER ARTHROSCOPY Bilateral   . SPINAL CORD STIMULATOR INSERTION N/A 09/23/2015  Procedure: LUMBAR SPINAL CORD STIMULATOR INSERTION;  Surgeon: [name], MD;  Location: [facility];  Service: Neurosurgery;  Laterality: N/A;  LUMBAR SPINAL CORD STIMULATOR INSERTION  . TOTAL KNEE ARTHROPLASTY Left 06/17/2017  . TOTAL KNEE ARTHROPLASTY Left 06/17/2017   Procedure: TOTAL KNEE ARTHROPLASTY;  Surgeon: [name], MD;  Location: [facility];  Service: Orthopedics;  Laterality: Left;  . TOTAL KNEE ARTHROPLASTY Right 12/16/2017   Procedure: TOTAL KNEE ARTHROPLASTY;  Surgeon: [name], MD;  Location: [facility];  Service: Orthopedics;  Laterality: Right;     History reviewed. No pertinent family history.   Social History   Socioeconomic History  . Marital status: Single    Spouse name: Not on file  . Number of children: Not on file  . Years of education: Not on file  . Highest education level: Not on file  Occupational History  . Not on file  Tobacco Use  . Smoking status: Former Smoker    Packs/day: 1.00    Years: 30.00    Pack years: 30.00    Types: Cigarettes    Quit date: 01/14/1987    Years since quitting: 33.7  . Smokeless tobacco: Former Systems developer    Types: Chew  . Tobacco comment: quit smoking  & chewing by 1987  Vaping Use  . Vaping Use: Never used  Substance and Sexual Activity  . Alcohol use: Yes    Alcohol/week: 15.0 standard drinks    Types: 3 Glasses of wine, 12 Cans of beer per week    Comment: 2-3 times weekly   . Drug use: No  . Sexual activity: Not Currently  Other Topics Concern  . Not on file  Social History Narrative  . Not on file   Social  Determinants of Health   Financial Resource Strain:   . Difficulty of Paying Living Expenses: Not on file  Food Insecurity:   . Worried About Charity fundraiser in the Last Year: Not on file  . Ran Out of Food in the Last Year: Not on file  Transportation Needs:   . Lack of Transportation (Medical): Not on file  . Lack of Transportation (Non-Medical): Not on file  Physical Activity:   . Days of Exercise per Week: Not on file  . Minutes of Exercise per Session: Not on file  Stress:   . Feeling of Stress : Not on file  Social Connections:   . Frequency of Communication with Friends and Family: Not on file  . Frequency of Social Gatherings with Friends and Family: Not on file  . Attends Religious Services: Not on file  . Active Member of Clubs or Organizations: Not on file  . Attends Archivist Meetings: Not on file  . Marital Status: Not on file  Intimate Partner Violence:   . Fear of Current or Ex-Partner: Not on file  . Emotionally Abused: Not on file  . Physically Abused: Not on file  . Sexually Abused: Not on file     BP 122/72   Pulse (!) 52   Ht 5\' 8"  (1.727 m)   Wt 175 lb 3.2 oz (79.5 kg)   SpO2 97%   BMI 26.64 kg/m   Physical Exam:  Well appearing NAD HEENT: Unremarkable Neck:  No JVD, no thyromegally Lymphatics:  No adenopathy Back:  No CVA tenderness Lungs:  Clear with no wheezes HEART:  Regular rate rhythm, no murmurs, no rubs, no clicks Abd:  soft, positive bowel sounds, no organomegally, no rebound, no guarding  Ext:  2 plus pulses, no edema, no cyanosis, no clubbing Skin:  No rashes no nodules Neuro:  CN II through XII intact, motor grossly intact  EKG - sinus bradycardia  Assess/Plan: 1. PAF - he is maintaining NSR. He will continue amiodarone but will reduce the dose to one tablet daily, Mon-Saturday, none on Sunday. 2. Sleep apnea - he is non-compliant and was supposed to see Dr. Jola Schmidt but does not have an appt. He will be  rescheduled. 3. Dyspnea - etiology is unclear. He will undergo PFTs with a DLCO 4. Fatigue/feeling cold - we will check TSH. 5. Sinus node dysfunction - his beta blocker was stopped. His HR is still slow and we have reduced his dose of amio.  Christopher Overlie Cheresa Siers,MD

## 2020-10-20 NOTE — Patient Instructions (Signed)
Medication Instructions:  °Your physician recommends that you continue on your current medications as directed. Please refer to the Current Medication list given to you today. ° °*If you need a refill on your cardiac medications before your next appointment, please call your pharmacy* ° ° °Lab Work: °Your physician recommends that you return for lab work in: Today ° °If you have labs (blood work) drawn today and your tests are completely normal, you will receive your results only by: °MyChart Message (if you have MyChart) OR °A paper copy in the mail °If you have any lab test that is abnormal or we need to change your treatment, we will call you to review the results. ° ° °Testing/Procedures: °NONE  ° ° °Follow-Up: °At [facility], you and your health needs are our priority.  As part of our continuing mission to provide you with exceptional heart care, we have created designated Provider Care Teams.  These Care Teams include your primary Cardiologist (physician) and Advanced Practice Providers (APPs -  Physician Assistants and Nurse Practitioners) who all work together to provide you with the care you need, when you need it. ° °We recommend signing up for the patient portal called "MyChart".  Sign up information is provided on this After Visit Summary.  MyChart is used to connect with patients for Virtual Visits (Telemedicine).  Patients are able to view lab/test results, encounter notes, upcoming appointments, etc.  Non-urgent messages can be sent to your provider as well.   °To learn more about what you can do with MyChart, go to [email or web address].   ° °Your next appointment:   °6 month(s) ° °The format for your next appointment:   °In Person ° °Provider:   °Gregg Taylor, MD  ° ° °Other Instructions °Thank you for choosing [facility]! ° ° ° °

## 2020-10-24 DIAGNOSIS — E782 Mixed hyperlipidemia: Secondary | ICD-10-CM | POA: Diagnosis not present

## 2020-10-24 DIAGNOSIS — D519 Vitamin B12 deficiency anemia, unspecified: Secondary | ICD-10-CM | POA: Diagnosis not present

## 2020-10-24 DIAGNOSIS — E559 Vitamin D deficiency, unspecified: Secondary | ICD-10-CM | POA: Diagnosis not present

## 2020-10-24 DIAGNOSIS — I4891 Unspecified atrial fibrillation: Secondary | ICD-10-CM | POA: Diagnosis not present

## 2020-10-24 DIAGNOSIS — K219 Gastro-esophageal reflux disease without esophagitis: Secondary | ICD-10-CM | POA: Diagnosis not present

## 2020-10-25 ENCOUNTER — Other Ambulatory Visit (HOSPITAL_COMMUNITY)
Admission: RE | Admit: 2020-10-25 | Discharge: 2020-10-25 | Disposition: A | Discharge status: Home / Self Care | Payer: Medicare Other | Source: Ambulatory Visit | Attending: Internal Medicine | Admitting: Internal Medicine

## 2020-10-25 ENCOUNTER — Other Ambulatory Visit: Payer: Self-pay

## 2020-10-25 DIAGNOSIS — Z01812 Encounter for preprocedural laboratory examination: Secondary | ICD-10-CM | POA: Diagnosis not present

## 2020-10-25 DIAGNOSIS — Z20822 Contact with and (suspected) exposure to covid-19: Secondary | ICD-10-CM | POA: Insufficient documentation

## 2020-10-25 LAB — SARS CORONAVIRUS 2 (TAT 6-24 HRS): SARS Coronavirus 2: NEGATIVE

## 2020-10-28 ENCOUNTER — Other Ambulatory Visit: Payer: Self-pay

## 2020-10-28 ENCOUNTER — Ambulatory Visit (HOSPITAL_COMMUNITY)
Admission: RE | Admit: 2020-10-28 | Discharge: 2020-10-28 | Disposition: A | Discharge status: Home / Self Care | Payer: Medicare Other | Source: Ambulatory Visit | Attending: Internal Medicine | Admitting: Internal Medicine

## 2020-10-28 DIAGNOSIS — Z87891 Personal history of nicotine dependence: Secondary | ICD-10-CM | POA: Insufficient documentation

## 2020-10-28 DIAGNOSIS — J449 Chronic obstructive pulmonary disease, unspecified: Secondary | ICD-10-CM | POA: Diagnosis not present

## 2020-10-28 DIAGNOSIS — Z9989 Dependence on other enabling machines and devices: Secondary | ICD-10-CM | POA: Insufficient documentation

## 2020-10-28 LAB — PULMONARY FUNCTION TEST
DL/VA % pred: 81 %
DL/VA: 3.27 ml/min/mmHg/L
DLCO unc % pred: 68 %
DLCO unc: 16.22 ml/min/mmHg
FEF 25-75 Post: 2.15 L/sec
FEF 25-75 Pre: 1.95 L/sec
FEF2575-%Change-Post: 10 %
FEF2575-%Pred-Post: 105 %
FEF2575-%Pred-Pre: 95 %
FEV1-%Change-Post: 2 %
FEV1-%Pred-Post: 80 %
FEV1-%Pred-Pre: 78 %
FEV1-Post: 2.25 L
FEV1-Pre: 2.21 L
FEV1FVC-%Change-Post: 3 %
FEV1FVC-%Pred-Pre: 105 %
FEV6-%Change-Post: 0 %
FEV6-%Pred-Post: 77 %
FEV6-%Pred-Pre: 77 %
FEV6-Post: 2.82 L
FEV6-Pre: 2.85 L
FEV6FVC-%Change-Post: 0 %
FEV6FVC-%Pred-Post: 106 %
FEV6FVC-%Pred-Pre: 105 %
FVC-%Change-Post: -1 %
FVC-%Pred-Post: 72 %
FVC-%Pred-Pre: 73 %
FVC-Post: 2.83 L
FVC-Pre: 2.88 L
Post FEV1/FVC ratio: 80 %
Post FEV6/FVC ratio: 100 %
Pre FEV1/FVC ratio: 77 %
Pre FEV6/FVC Ratio: 99 %
RV % pred: 86 %
RV: 2.12 L
TLC % pred: 79 %
TLC: 5.3 L

## 2020-10-28 MED ORDER — ALBUTEROL SULFATE (2.5 MG/3ML) 0.083% IN NEBU
2.5000 mg | INHALATION_SOLUTION | Freq: Once | RESPIRATORY_TRACT | Status: AC
  Administered 2020-10-28: 2.5 mg via RESPIRATORY_TRACT

## 2020-10-31 ENCOUNTER — Telehealth: Payer: Self-pay | Admitting: *Deleted

## 2020-10-31 DIAGNOSIS — I4891 Unspecified atrial fibrillation: Secondary | ICD-10-CM | POA: Diagnosis not present

## 2020-10-31 DIAGNOSIS — I2510 Atherosclerotic heart disease of native coronary artery without angina pectoris: Secondary | ICD-10-CM | POA: Diagnosis not present

## 2020-10-31 DIAGNOSIS — G4733 Obstructive sleep apnea (adult) (pediatric): Secondary | ICD-10-CM | POA: Diagnosis not present

## 2020-10-31 NOTE — Telephone Encounter (Signed)
-----   Message from Evans Lance, MD sent at 10/30/2020  9:31 PM EST ----- Ask [name] to stop amiodarone. His lung function is test is mildly abnormal but I am concerned that if he continues to use amiodarone, the lungs will get worse. Also followup for CPAP.

## 2020-10-31 NOTE — Telephone Encounter (Signed)
Pt notified and will follow for CPAP today at 2:00 pm

## 2020-11-22 DIAGNOSIS — M542 Cervicalgia: Secondary | ICD-10-CM | POA: Diagnosis not present

## 2020-11-22 DIAGNOSIS — M961 Postlaminectomy syndrome, not elsewhere classified: Secondary | ICD-10-CM | POA: Diagnosis not present

## 2020-11-22 DIAGNOSIS — Z9689 Presence of other specified functional implants: Secondary | ICD-10-CM | POA: Diagnosis not present

## 2020-11-23 DIAGNOSIS — H04123 Dry eye syndrome of bilateral lacrimal glands: Secondary | ICD-10-CM | POA: Diagnosis not present

## 2020-12-13 ENCOUNTER — Ambulatory Visit (INDEPENDENT_AMBULATORY_CARE_PROVIDER_SITE_OTHER): Payer: Medicare Other | Admitting: Internal Medicine

## 2020-12-16 DIAGNOSIS — I4891 Unspecified atrial fibrillation: Secondary | ICD-10-CM | POA: Diagnosis not present

## 2020-12-16 DIAGNOSIS — K219 Gastro-esophageal reflux disease without esophagitis: Secondary | ICD-10-CM | POA: Diagnosis not present

## 2020-12-16 DIAGNOSIS — E782 Mixed hyperlipidemia: Secondary | ICD-10-CM | POA: Diagnosis not present

## 2020-12-19 ENCOUNTER — Encounter: Payer: Self-pay | Admitting: Pulmonary Disease

## 2020-12-19 ENCOUNTER — Ambulatory Visit: Payer: Medicare Other | Admitting: Pulmonary Disease

## 2020-12-19 ENCOUNTER — Other Ambulatory Visit: Payer: Self-pay

## 2020-12-19 VITALS — BP 122/68 | HR 58 | Temp 97.6°F | Ht 68.0 in | Wt 168.8 lb

## 2020-12-19 DIAGNOSIS — G4733 Obstructive sleep apnea (adult) (pediatric): Secondary | ICD-10-CM | POA: Diagnosis not present

## 2020-12-19 DIAGNOSIS — E119 Type 2 diabetes mellitus without complications: Secondary | ICD-10-CM | POA: Diagnosis not present

## 2020-12-19 DIAGNOSIS — Z23 Encounter for immunization: Secondary | ICD-10-CM

## 2020-12-19 NOTE — Patient Instructions (Signed)
Will get you set up with [facility] for your Bipap supply company and have them adjust your settings  Follow up in 3 months

## 2020-12-19 NOTE — Progress Notes (Signed)
[facility]  Chief Complaint  Patient presents with  . Consult    Sleep consult    Constitutional:  BP 122/68 (BP Location: Right Leg, Cuff Size: Normal)   Pulse (!) 58   Temp 97.6 F (36.4 C) (Other (Comment)) Comment (Src): wrist  Ht 5\' 8"  (1.727 m)   Wt 168 lb 12.8 oz (76.6 kg)   SpO2 96% Comment: Room air  BMI 25.67 kg/m   Past Medical History:  A fib, OA, BPH, Back pain, Constipation, DVT 2018, GERD, HH, Colon polyp, PUD, Macular degeneration, Neuropathy, CAD s/p CABG  Past Surgical History:  He  has a past surgical history that includes Appendectomy; Colonoscopy with esophagogastroduodenoscopy (egd) (N/A, 01/22/2013); Carpal tunnel release (Bilateral, 10/2005 - 11/2005); Cataract extraction w/PHACO (Right, 05/24/2014); Cataract extraction w/PHACO (Left, 06/17/2014); Spinal cord stimulator insertion (N/A, 09/23/2015); Shoulder arthroscopy (Bilateral); Anterior cervical discectomy (<11/2005); Total knee arthroplasty (Left, 06/17/2017); Anterior cervical decomp/discectomy fusion (~ 1991); Anterior cervical decomp/discectomy fusion (X 2); Back surgery; Posterior lumbar fusion; Eye surgery (Bilateral); Cardiac catheterization; Total knee arthroplasty (Left, 06/17/2017); Joint replacement; Quadriceps tendon repair (Left, 07/01/2017); Quadriceps tendon repair (Left, 07/01/2017); Total knee arthroplasty (Right, 12/16/2017); Colonoscopy with propofol (N/A, 05/30/2018); Esophagogastroduodenoscopy (egd) with propofol (N/A, 05/30/2018); biopsy (05/30/2018); and LEFT HEART CATH AND CORONARY ANGIOGRAPHY (N/A, 02/16/2019).  Brief Summary:  Christopher [name] is a 75 y.o. male former smoker with obstructive sleep apnea and dyspnea.      Subjective:   He has sleep study through [facility] in 2020.  Found to have moderate obstructive sleep apnea.  Had titration study.  Wasn't able to be titrated with CPAP.  End up getting prescribed Bipap at 21/17 cm H2O.  He was  eventually set up with VPAP auto with max IPAP 25, min EPAP 4, PS 4 cm H2O.  With this set up his AHI was reduced to 1.5.  This was then changed to Bipap 11/7 cm H2O.  Around this time he noticed having trouble using Bipap.  He felt like he wasn't getting enough pressure and would wake up feeling choked.  He was using a DME in [place], [place] but the drive was too long.  He would like to switch to [place].  He goes to sleep at 11 pm.  He falls asleep in 15 minutes.  He wakes up some times to use the bathroom.  He gets out of bed at 7am.  He feels okay in the morning.  He denies morning headache.  He does not use anything to help him stay awake.  He has been using xanax to help sleep, and has been doing so for years.  He denies sleep walking, sleep talking, bruxism, or nightmares.  There is no history of restless legs.  He denies sleep hallucinations, sleep paralysis, or cataplexy.  The Epworth score is 20 out of 24.  He has noticed trouble with his breathing when doing certain activities, such as waking up stairs.  He recovers after he sits and rests for a few minutes.  He was seen by Dr. [facility] in January 2020 for dyspnea.  CT chest showed very minimal paraseptal emphysema and PFT showed mild diffusion defect.  Neither of these were felt to be significant enough to explain his dyspnea.  He is not having cough, wheeze, or chest congestion.  He does sometimes get vague chest soreness across his chest, and this has occurred since he had CABG.   Physical Exam:   Appearance -  well kempt   ENMT - no sinus tenderness, no oral exudate, no LAN, Mallampati 3 airway, no stridor, wears dentures  Respiratory - equal breath sounds bilaterally, no wheezing or rales  CV - s1s2 regular rate and rhythm, no murmurs  Ext - no clubbing, no edema  Skin - no rashes  Psych - normal mood and affect   Pulmonary testing:   PFT 10/28/20 >> FEV1 2.25 (80%), FEV1% 80, TLC 5.30 (79%), DLCO 68%  Chest  Imaging:   CT chest 01/22/19 >> atherosclerosis, mild paraseptal emphysema, basilar scarring  Sleep Tests:   PSG 08/31/16 >> AHI 2, SpO2 low 85%  PSG 10/20/19 >> AHI 17.6, SpO2 low 82%  Cardiac Tests:   Echo 12/25/19 >> EF 55 to 60%  Social History:  He  reports that he quit smoking about 33 years ago. His smoking use included cigarettes. He has a 30.00 pack-year smoking history. He has quit using smokeless tobacco.  His smokeless tobacco use included chew. He reports current alcohol use of about 15.0 standard drinks of alcohol per week. He reports that he does not use drugs.  Family History:  His family history is not on file.     Assessment/Plan:   Obstructive sleep apnea. - reviewed his previous sleep study results with him - will have him switched to [place] for his DME - I suspect when his Auto Bipap setting was changed to fixed pressure setting in March 2021, this is when he started having trouble using Bipap - will have his DME change his auto Bipap back to max IPAP 25, min EPAP 4, PS 4 cm H2O - if he continues to have trouble using Bipap, then he would need a repeat in lab titration study  Insomnia. - he has used xanax for years, and unlikely he would be able to tolerate weaning off this - he gets refills for xanax through his PCP  Dyspnea on exertion. - I suspect this is mostly related to deconditioning - recent pulmonary testing (PFT, CT chest) didn't show significant lung disease that would explain his symptoms - advised him to f/u cardiology to assess whether he has progression of cardiac disease that could be contributing to his symptoms - advised him to start a gradual exercise regimen as tolerated  Paroxysmal atrial fibrillation, Coronary artery disease. - followed by Dr. Cristopher Peru and Dr. Jenkins Rouge with [facility]  Vaccination. - high dose influenza vaccine today  Time Spent Involved in Patient Care on Day of Examination:  47  minutes  Follow up:  Patient Instructions  Will get you set up with [place] [facility] for your Bipap supply company and have them adjust your settings  Follow up in 3 months   Medication List:   Allergies as of 12/19/2020      Reactions   Morphine Nausea And Vomiting   Tylenol [acetaminophen]    Stomach pain    Morphine And Related Nausea And Vomiting   Propofol Nausea And Vomiting      Medication List       Accurate as of December 19, 2020 11:16 AM. If you have any questions, ask your nurse or doctor.        alfuzosin 10 MG 24 hr tablet Commonly known as: UROXATRAL Take 1 tablet (10 mg total) by mouth daily with breakfast.   ALPRAZolam 1 MG tablet Commonly known as: XANAX Take 1 mg by mouth at bedtime.   amiodarone 200 MG tablet Commonly known as: PACERONE Take  200 mg by mouth daily.   apixaban 5 MG Tabs tablet Commonly known as: ELIQUIS Take 1 tablet (5 mg total) by mouth 2 (two) times daily.   aspirin EC 81 MG tablet Take 1 tablet (81 mg total) by mouth daily.   atorvastatin 80 MG tablet Commonly known as: LIPITOR TAKE 1 TABLET BY MOUTH EVERY DAY   B-12 PO Take by mouth daily.   docusate sodium 250 MG capsule Commonly known as: COLACE Take 250 mg by mouth daily.   FIBER-CAPS PO Take 2 capsules by mouth daily.   finasteride 5 MG tablet Commonly known as: PROSCAR Take 1 tablet (5 mg total) by mouth daily.   furosemide 20 MG tablet Commonly known as: LASIX TAKE 1 TABLET BY MOUTH EVERY DAY   gabapentin 400 MG capsule Commonly known as: NEURONTIN One three times a day What changed:   how much to take  how to take this  when to take this   HYDROcodone-acetaminophen 10-325 MG tablet Commonly known as: NORCO Take 1 tablet by mouth every 8 (eight) hours as needed for moderate pain.   isosorbide mononitrate 60 MG 24 hr tablet Commonly known as: IMDUR Take 60 mg by mouth daily.   metoprolol tartrate 25 MG tablet Commonly known as:  LOPRESSOR Take 25 mg by mouth 2 (two) times daily.   nitroGLYCERIN 0.4 MG SL tablet Commonly known as: NITROSTAT Place 1 tablet (0.4 mg total) under the tongue every 5 (five) minutes as needed for chest pain.   Omega-3 1000 MG Caps Take by mouth.   pantoprazole 40 MG tablet Commonly known as: PROTONIX TAKE 1 TABLET BY MOUTH TWICE A DAY (30 TO 60 MINUTES BEFORE 1ST AND LAST MEALS OF THE DAY)   polyethylene glycol powder 17 GM/SCOOP powder Commonly known as: GLYCOLAX/MIRALAX Take 8.5 g by mouth daily.   potassium chloride 10 MEQ tablet Commonly known as: KLOR-CON Take by mouth.   silodosin 8 MG Caps capsule Commonly known as: RAPAFLO Take 8 mg by mouth daily with breakfast.   Simethicone 180 MG Caps Take 1 capsule (180 mg total) by mouth 3 (three) times daily as needed.   tamsulosin 0.4 MG Caps capsule Commonly known as: FLOMAX Take 0.4 mg by mouth at bedtime.   Vitamin D3 10 MCG (400 UNIT) Caps Take 400 Units by mouth daily.   vitamin E 200 UNIT capsule Take 200 Units by mouth daily.       Signature:  Chesley Mires, MD [place] Pager - [phone] 12/19/2020, 11:16 AM

## 2020-12-27 DIAGNOSIS — G4733 Obstructive sleep apnea (adult) (pediatric): Secondary | ICD-10-CM | POA: Diagnosis not present

## 2020-12-31 ENCOUNTER — Other Ambulatory Visit: Payer: Self-pay | Admitting: Internal Medicine

## 2020-12-31 ENCOUNTER — Other Ambulatory Visit: Payer: Self-pay | Admitting: Interventional Cardiology

## 2020-12-31 ENCOUNTER — Encounter (HOSPITAL_COMMUNITY): Payer: Self-pay | Admitting: Emergency Medicine

## 2020-12-31 ENCOUNTER — Emergency Department (HOSPITAL_COMMUNITY)
Admission: EM | Admit: 2020-12-31 | Discharge: 2021-01-01 | Disposition: A | Discharge status: Home / Self Care | Payer: Medicare Other | Attending: Emergency Medicine | Admitting: Emergency Medicine

## 2020-12-31 ENCOUNTER — Emergency Department (HOSPITAL_COMMUNITY): Payer: Medicare Other

## 2020-12-31 ENCOUNTER — Other Ambulatory Visit: Payer: Self-pay

## 2020-12-31 DIAGNOSIS — I2510 Atherosclerotic heart disease of native coronary artery without angina pectoris: Secondary | ICD-10-CM | POA: Insufficient documentation

## 2020-12-31 DIAGNOSIS — R531 Weakness: Secondary | ICD-10-CM | POA: Insufficient documentation

## 2020-12-31 DIAGNOSIS — Z7901 Long term (current) use of anticoagulants: Secondary | ICD-10-CM | POA: Diagnosis not present

## 2020-12-31 DIAGNOSIS — Z87891 Personal history of nicotine dependence: Secondary | ICD-10-CM | POA: Insufficient documentation

## 2020-12-31 DIAGNOSIS — Z20822 Contact with and (suspected) exposure to covid-19: Secondary | ICD-10-CM | POA: Diagnosis not present

## 2020-12-31 DIAGNOSIS — R9431 Abnormal electrocardiogram [ECG] [EKG]: Secondary | ICD-10-CM | POA: Insufficient documentation

## 2020-12-31 DIAGNOSIS — R112 Nausea with vomiting, unspecified: Secondary | ICD-10-CM | POA: Diagnosis not present

## 2020-12-31 DIAGNOSIS — Z79899 Other long term (current) drug therapy: Secondary | ICD-10-CM | POA: Diagnosis not present

## 2020-12-31 DIAGNOSIS — Z7982 Long term (current) use of aspirin: Secondary | ICD-10-CM | POA: Insufficient documentation

## 2020-12-31 MED ORDER — SODIUM CHLORIDE 0.9 % IV SOLN: INTRAVENOUS | Status: DC

## 2020-12-31 MED ORDER — ASPIRIN 81 MG PO CHEW
324.0000 mg | CHEWABLE_TABLET | Freq: Once | ORAL | Status: AC
  Administered 2020-12-31: 324 mg via ORAL
  Filled 2020-12-31: qty 4

## 2020-12-31 NOTE — ED Triage Notes (Signed)
Pt c/o vomiting with weakness x 1day

## 2020-12-31 NOTE — ED Provider Notes (Signed)
[facility] EMERGENCY DEPARTMENT Provider Note   CSN: [id number] Arrival date & time: 12/31/20  2224   Time seen 11:26 PM  History Chief Complaint  Patient presents with  . Emesis    BION BORLAND is a 75 y.o. male.  HPI   Patient states this morning he ate breakfast and forgot to take his morning medications.  He was working on somebody's bathroom at about 1 PM started feeling like he had no energy.  He states he drove home and had a lot of nausea and felt weak.  He ate an apple and then about an hour and a half later remembered he had forgotten his morning medicine and took those.  He went back to work on the bathroom and then had 1 episode of vomiting.  He came home around dark and states he got cold and then he was sweaty for several hours.  He denies chest pain or shortness of breath.    Patient states he had CABG done in October 2020, he states at that time he was experiencing weakness but no chest pain.  He has quit smoking 30 years ago  PCP Celene Squibb, MD Cardiology Dr Lovena Le  Past Medical History:  Diagnosis Date  . A-fib ([place])   . Arthritis    "all over my body" (06/17/2017)  . BPH (benign prostatic hyperplasia)    takes Proscar daily  . Chronic back pain    DDD; "upper and lower" (06/17/2017)  . Constipation    takes Colace daily  . DDD (degenerative disc disease), cervical   . DVT (deep venous thrombosis) (HCC)    left peroneal vein 07/2017 (in setting of recent left TKA)  . Dysrhythmia    history of Atrial fibrillation, no long has this  . Enlarged prostate   . GERD (gastroesophageal reflux disease)    takes Nexium daily  . H/O hiatal hernia   . History of colon polyps    benign  . History of gastric ulcer   . Insomnia    takes Xanax nightly  . Joint capsule tear    left knee  . Joint pain   . Joint swelling   . Macular degeneration   . Muscle spasm of back    takes Valium daily as needed  . PAF (paroxysmal atrial fibrillation) ([place])   . Peripheral  neuropathy    takes Gabapentin daily  . PONV (postoperative nausea and vomiting)   . Sleep apnea    pt does not wear a cpap  . Urinary hesitancy   . Weakness    numbness and tingling    Patient Active Problem List   Diagnosis Date Noted  . Constipation 08/23/2020  . Tiredness 08/23/2020  . Nocturia 06/30/2020  . Abdominal distension 08/18/2019  . Coronary artery disease involving native coronary artery of native heart with angina pectoris ([place])   . Upper airway cough syndrome 01/05/2019  . DOE (dyspnea on exertion) 12/15/2018  . Gastroesophageal reflux disease 04/28/2018  . Hx of colonic polyps 04/28/2018  . Barrett's esophagus without dysplasia 04/15/2018  . Gastroesophageal reflux disease without esophagitis 04/15/2018  . History of colonic polyps 04/15/2018  . Atrial fibrillation with RVR ([place]) 07/23/2017  . History of gastric ulcer   . Chronic back pain   . BPH (benign prostatic hyperplasia)   . 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 Chronic pain of right knee 05/14/2017  . Spondylolisthesis of lumbar region  03/05/2014  . Barrett esophagus 02/24/2013  . Personal history of colonic polyps 02/24/2013  . Epigastric pain 01/06/2013  . Encounter for screening colonoscopy 01/06/2013  . Pain in joint, shoulder region 07/09/2012  . Muscle weakness (generalized) 07/09/2012    Past Surgical History:  Procedure Laterality Date  . ANTERIOR CERVICAL DECOMP/DISCECTOMY FUSION  ~ 1991   "took bone from hip"  . ANTERIOR CERVICAL DECOMP/DISCECTOMY FUSION  X 2   "put plate in"  . ANTERIOR CERVICAL DISCECTOMY  <11/2005    5-6 and 4-5./notes 05/02/2011  . APPENDECTOMY    . BACK SURGERY    . BIOPSY  05/30/2018   Procedure: BIOPSY;  Surgeon: [name], MD;  Location: [facility];  Service: Endoscopy;;  esophagus  . CARDIAC CATHETERIZATION    . CARPAL TUNNEL RELEASE Bilateral 10/2005 - 11/2005   right-left Ar[facility] Endo 05/02/2011   . CATARACT EXTRACTION W/PHACO Right 05/24/2014   Procedure: CATARACT EXTRACTION PHACO AND INTRAOCULAR LENS PLACEMENT (IOC);  Surgeon: [name], MD;  Location: [facility] ORS;  Service: Ophthalmology;  Laterality: Right;  CDE 6.74  . CATARACT EXTRACTION W/PHACO Left 06/17/2014   Procedure: CATARACT EXTRACTION PHACO AND INTRAOCULAR LENS PLACEMENT (IOC);  Surgeon: [name], MD;  Location: [facility];  Service: Ophthalmology;  Laterality: Left;  CDE:4.65  . COLONOSCOPY WITH ESOPHAGOGASTRODUODENOSCOPY (EGD) N/A 01/22/2013   Procedure: COLONOSCOPY WITH ESOPHAGOGASTRODUODENOSCOPY (EGD);  Surgeon: [name], MD;  [facility];  Service: Endoscopy;  Laterality: N/A;  140  . COLONOSCOPY WITH PROPOFOL N/A 05/30/2018   Procedure: COLONOSCOPY WITH PROPOFOL;  Surgeon: [name], MD;  [facility];  Service: Endoscopy;  Laterality: N/A;  . ESOPHAGOGASTRODUODENOSCOPY (EGD) WITH PROPOFOL N/A 05/30/2018   Procedure: ESOPHAGOGASTRODUODENOSCOPY (EGD) WITH PROPOFOL;  Surgeon: [name], MD;  [facility];  Service: Endoscopy;  Laterality: N/A;  . EYE SURGERY Bilateral    "cleaned film w/laser; since my 1st cataract OR"  . JOINT REPLACEMENT    . LEFT HEART CATH AND CORONARY ANGIOGRAPHY N/A 02/16/2019   Procedure: LEFT HEART CATH AND CORONARY ANGIOGRAPHY;  Surgeon: Belva Crome, MD;  Location: [facility];  Service: Cardiovascular;  Laterality: N/A;  . POSTERIOR LUMBAR FUSION     "put a plate in"  . QUADRICEPS TENDON REPAIR Left 07/01/2017  . QUADRICEPS TENDON REPAIR Left 07/01/2017   Procedure: REPAIR QUADRICEP TENDON;  Surgeon: [name], MD;  Location: [facility];  Service: Orthopedics;  Laterality: Left;  . SHOULDER ARTHROSCOPY Bilateral   . SPINAL CORD STIMULATOR INSERTION N/A 09/23/2015   Procedure: LUMBAR SPINAL CORD STIMULATOR INSERTION;  Surgeon: [name], MD;  Location: [facility];  Service: Neurosurgery;  Laterality: N/A;  LUMBAR SPINAL CORD STIMULATOR  INSERTION  . TOTAL KNEE ARTHROPLASTY Left 06/17/2017  . TOTAL KNEE ARTHROPLASTY Left 06/17/2017   Procedure: TOTAL KNEE ARTHROPLASTY;  Surgeon: [name], MD;  Location: [facility];  Service: Orthopedics;  Laterality: Left;  . TOTAL KNEE ARTHROPLASTY Right 12/16/2017   Procedure: TOTAL KNEE ARTHROPLASTY;  Surgeon: [name], MD;  Location: [facility];  Service: Orthopedics;  Laterality: Right;       History reviewed. No pertinent family history.  Social History   Tobacco Use  . Smoking status: Former Smoker    Packs/day: 1.00    Years: 30.00    Pack years: 30.00    Types: Cigarettes    Quit date: 01/14/1987    Years since quitting: 33.9  . Smokeless tobacco: Former Systems developer  Types: Chew  . Tobacco comment: quit smoking  & chewing by 1987  Vaping Use  . Vaping Use: Never used  Substance Use Topics  . Alcohol use: Yes    Alcohol/week: 15.0 standard drinks    Types: 3 Glasses of wine, 12 Cans of beer per week    Comment: 2-3 times weekly   . Drug use: No  lives at home  Home Medications Prior to Admission medications   Medication Sig Start Date End Date Taking? Authorizing Provider  alfuzosin (UROXATRAL) 10 MG 24 hr tablet Take 1 tablet (10 mg total) by mouth daily with breakfast. 07/26/20   McKenzie, Candee Furbish, MD  ALPRAZolam Duanne Moron) 1 MG tablet Take 1 mg by mouth at bedtime. 09/27/17   [provider]  amiodarone (PACERONE) 200 MG tablet Take 200 mg by mouth daily.    [provider]  apixaban (ELIQUIS) 5 MG TABS tablet Take 1 tablet (5 mg total) by mouth 2 (two) times daily. 05/31/18   [name], MD  aspirin EC 81 MG tablet Take 1 tablet (81 mg total) by mouth daily. 02/11/19   Richardson Dopp T, PA-C  atorvastatin (LIPITOR) 80 MG tablet TAKE 1 TABLET BY MOUTH EVERY DAY 02/01/20   Belva Crome, MD  Calcium Polycarbophil (FIBER-CAPS PO) Take 2 capsules by mouth daily.    [provider]  Cholecalciferol (VITAMIN D3) 10 MCG (400 UNIT) CAPS Take 400  Units by mouth daily.    [provider]  Cyanocobalamin (B-12 PO) Take by mouth daily.    [provider]  docusate sodium (COLACE) 250 MG capsule Take 250 mg by mouth daily.    [provider]  finasteride (PROSCAR) 5 MG tablet Take 1 tablet (5 mg total) by mouth daily. 06/30/20   McKenzie, Candee Furbish, MD  furosemide (LASIX) 20 MG tablet TAKE 1 TABLET BY MOUTH EVERY DAY 02/01/20   Evans Lance, MD  gabapentin (NEURONTIN) 400 MG capsule One three times a day Patient taking differently: Take 400 mg by mouth 3 (three) times daily. One three times a day 01/05/19   Tanda Rockers, MD  HYDROcodone-acetaminophen [facility]) 10-325 MG tablet Take 1 tablet by mouth every 8 (eight) hours as needed for moderate pain.     [provider]  isosorbide mononitrate (IMDUR) 60 MG 24 hr tablet Take 60 mg by mouth daily.    [provider]  metoprolol tartrate (LOPRESSOR) 25 MG tablet Take 25 mg by mouth 2 (two) times daily.    [provider]  nitroGLYCERIN (NITROSTAT) 0.4 MG SL tablet Place 1 tablet (0.4 mg total) under the tongue every 5 (five) minutes as needed for chest pain. 08/26/19   Belva Crome, MD  Omega-3 1000 MG CAPS Take by mouth.    [provider]  pantoprazole (PROTONIX) 40 MG tablet TAKE 1 TABLET BY MOUTH TWICE A DAY (30 TO 60 MINUTES BEFORE 1ST AND LAST MEALS OF THE DAY) 10/20/19   Tanda Rockers, MD  polyethylene glycol powder (GLYCOLAX/MIRALAX) 17 GM/SCOOP powder Take 8.5 g by mouth daily. 08/23/20   [name], MD  potassium chloride (KLOR-CON) 10 MEQ tablet Take by mouth. 09/29/19   [provider]  silodosin (RAPAFLO) 8 MG CAPS capsule Take 8 mg by mouth daily with breakfast.    [provider]  Simethicone 180 MG CAPS Take 1 capsule (180 mg total) by mouth 3 (three) times daily as needed. 08/18/19   [name], MD  tamsulosin (FLOMAX) 0.4 MG CAPS capsule Take 0.4 mg by mouth at bedtime. Patient not taking:  Reported on 12/19/2020    [provider]  vitamin E 200 UNIT capsule Take 200 Units by mouth daily.    [provider]    Allergies    Morphine, Tylenol [acetaminophen], Morphine and related, and Propofol  Review of Systems   Review of Systems  All other systems reviewed and are negative.   Physical Exam Updated Vital Signs BP 125/66   Pulse (!) 55   Temp 97.6 F (36.4 C) (Oral)   Resp 11   Ht 5\' 8"  (1.727 m)   Wt 77.1 kg   SpO2 98%   BMI 25.85 kg/m   Physical Exam Vitals and nursing note reviewed.  Constitutional:      General: He is not in acute distress.    Appearance: Normal appearance. He is normal weight. He is not ill-appearing or toxic-appearing.  HENT:     Head: Normocephalic and atraumatic.     Right Ear: External ear normal.     Left Ear: External ear normal.  Eyes:     Extraocular Movements: Extraocular movements intact.     Conjunctiva/sclera: Conjunctivae normal.     Pupils: Pupils are equal, round, and reactive to light.  Cardiovascular:     Rate and Rhythm: Normal rate and regular rhythm.     Pulses: Normal pulses.     Heart sounds: Normal heart sounds.  Pulmonary:     Effort: Pulmonary effort is normal. No respiratory distress.     Breath sounds: Normal breath sounds.  Abdominal:     General: Bowel sounds are normal.     Palpations: Abdomen is soft.     Tenderness: There is no abdominal tenderness.  Musculoskeletal:        General: Normal range of motion.     Cervical back: Normal range of motion.  Skin:    General: Skin is warm and dry.  Neurological:     General: No focal deficit present.     Mental Status: He is alert and oriented to person, place, and time.     Cranial Nerves: No cranial nerve deficit.  Psychiatric:        Mood and Affect: Mood normal.        Behavior: Behavior normal.        Thought Content: Thought content normal.     ED Results / Procedures / Treatments   Labs (all labs ordered are listed, but  only abnormal results are displayed) Results for orders placed or performed during the hospital encounter of 12/31/20  Resp Panel by RT-PCR (Flu A&B, Covid) Nasopharyngeal Swab   Specimen: Nasopharyngeal Swab; Nasopharyngeal(NP) swabs in vial transport medium  Result Value Ref Range   SARS Coronavirus 2 by RT PCR NEGATIVE NEGATIVE   Influenza A by PCR NEGATIVE NEGATIVE   Influenza B by PCR NEGATIVE NEGATIVE  CBC with Differential/Platelet  Result Value Ref Range   WBC 5.3 4.0 - 10.5 K/uL   RBC 4.20 (L) 4.22 - 5.81 MIL/uL   Hemoglobin 13.0 13.0 - 17.0 g/dL   HCT 39.1 39.0 - 52.0 %   MCV 93.1 80.0 - 100.0 fL   MCH 31.0 26.0 - 34.0 pg   MCHC 33.2 30.0 - 36.0 g/dL   RDW 13.3 11.5 - 15.5 %   Platelets 232 150 - 400 K/uL   nRBC 0.0 0.0 - 0.2 %   Neutrophils Relative % 69 %   Neutro  Abs 3.7 1.7 - 7.7 K/uL   Lymphocytes Relative 21 %   Lymphs Abs 1.1 0.7 - 4.0 K/uL   Monocytes Relative 8 %   Monocytes Absolute 0.4 0.1 - 1.0 K/uL   Eosinophils Relative 2 %   Eosinophils Absolute 0.1 0.0 - 0.5 K/uL   Basophils Relative 0 %   Basophils Absolute 0.0 0.0 - 0.1 K/uL   Immature Granulocytes 0 %   Abs Immature Granulocytes 0.01 0.00 - 0.07 K/uL  Protime-INR  Result Value Ref Range   Prothrombin Time 13.9 11.4 - 15.2 seconds   INR 1.1 0.8 - 1.2  APTT  Result Value Ref Range   aPTT 33 24 - 36 seconds  Comprehensive metabolic panel  Result Value Ref Range   Sodium 140 135 - 145 mmol/L   Potassium 3.6 3.5 - 5.1 mmol/L   Chloride 104 98 - 111 mmol/L   CO2 26 22 - 32 mmol/L   Glucose, Bld 117 (H) 70 - 99 mg/dL   BUN 14 8 - 23 mg/dL   Creatinine, Ser 0.86 0.61 - 1.24 mg/dL   Calcium 9.4 8.9 - 10.3 mg/dL   Total Protein 6.9 6.5 - 8.1 g/dL   Albumin 4.1 3.5 - 5.0 g/dL   AST 44 (H) 15 - 41 U/L   ALT 54 (H) 0 - 44 U/L   Alkaline Phosphatase 77 38 - 126 U/L   Total Bilirubin 0.8 0.3 - 1.2 mg/dL   GFR, Estimated >60 >60 mL/min   Anion gap 10 5 - 15  Lipid panel  Result Value Ref Range    Cholesterol 120 0 - 200 mg/dL   Triglycerides 45 <150 mg/dL   HDL 53 >40 mg/dL   Total CHOL/HDL Ratio 2.3 RATIO   VLDL 9 0 - 40 mg/dL   LDL Cholesterol 58 0 - 99 mg/dL  Troponin I (High Sensitivity)  Result Value Ref Range   Troponin I (High Sensitivity) 5 <18 ng/L  Troponin I (High Sensitivity)  Result Value Ref Range   Troponin I (High Sensitivity) 5 <18 ng/L   Laboratory interpretation all normal except minor elevation of some of his LFTs however there are no old labs to compare to, negative delta troponin    EKG EKG Interpretation  Date/Time:  Saturday December 31 2020 23:14:20 EST Ventricular Rate:  59 PR Interval:    QRS Duration: 116 QT Interval:  486 QTC Calculation: 482 R Axis:   54 Text Interpretation: Sinus rhythm Borderline prolonged PR interval Incomplete right bundle branch block ST elevation, consider lateral injury Since last tracing rate slower 15 Feb 2018 Confirmed by Rolland Porter [phone]) on 12/31/2020 11:20:29 PM  #2 EKG  EKG Interpretation  Date/Time:  Saturday December 31 2020 23:28:33 EST Ventricular Rate:  57 PR Interval:    QRS Duration: 122 QT Interval:  494 QTC Calculation: 481 R Axis:   38 Text Interpretation: Sinus rhythm Borderline prolonged PR interval IVCD, consider atypical RBBB Since last tracing 15 Feb 2018 ST elevation, consider inferior injury Confirmed by Rolland Porter [phone]) on 12/31/2020 11:38:03 PM      #3 EKG   EKG Interpretation  Date/Time:  Sunday January 01 2021 03:26:25 EST Ventricular Rate:  56 PR Interval:    QRS Duration: 109 QT Interval:  487 QTC Calculation: 470 R Axis:   -17 Text Interpretation: Sinus rhythm Prolonged PR interval Borderline left axis deviation RSR' in V1 or V2, right VCD or RVH Since last tracing about 4 hours prior, ST no longer  elevated in Inferolateral leads Confirmed by Rolland Porter [phone]) on 01/01/2021 3:32:28 AM         Radiology DG Chest Port 1 View  Result Date: 01/01/2021 CLINICAL DATA:   Weakness and abnormal EKG EXAM: PORTABLE CHEST 1 VIEW COMPARISON:  July 22, 2020 FINDINGS: The heart size and mediastinal contours are within normal limits. Overlying median sternotomy wires are present. Both lungs are clear. Spinal stimulator leads are seen. Cervical fixation hardware is noted. The visualized skeletal structures are unremarkable. IMPRESSION: No active disease. Electronically Signed   By: Prudencio Pair M.D.   On: 01/01/2021 01:15    Procedures Procedures (including critical care time)  Medications Ordered in ED Medications  0.9 %  sodium chloride infusion ( Intravenous New Bag/Given 12/31/20 2357)  aspirin chewable tablet 324 mg (324 mg Oral Given 12/31/20 2354)    ED Course  I have reviewed the triage vital signs and the nursing notes.  Pertinent labs & imaging results that were available during my care of the patient were reviewed by me and considered in my medical decision making (see chart for details).    MDM Rules/Calculators/A&P                         Patient has some subtle changes on his EKG, laboratory testing was done to evaluate that further.   11:44 AM Dr Marcelle Smiling, Cardiology, has reviewed his x-ray, he states to call back if his enzymes are positive.  3:10 AM patient's delta troponin has resulted and it is normal and negative.  When I talked to the patient he states he feels fine now.  I will talk to cardiology again.  Patient has some minor elevation of a couple of his LFTs, outpatient gallbladder ultrasound will be ordered.  3:43 AM patient discussed with Dr. Marcelle Smiling, cardiology, he feels patient can be discharged home to follow-up with Dr. Lovena Le within the next week or 2.   Final Clinical Impression(s) / ED Diagnoses Final diagnoses:  Weakness  Abnormal EKG    Rx / DC Orders ED Discharge Orders         Ordered    US Abdomen Limited RUQ/Gall Gladder        01/01/21 0345         Plan discharge  Rolland Porter, MD, Barbette Or,  MD 01/01/21 [phone]

## 2021-01-01 DIAGNOSIS — R9431 Abnormal electrocardiogram [ECG] [EKG]: Secondary | ICD-10-CM | POA: Diagnosis not present

## 2021-01-01 DIAGNOSIS — R531 Weakness: Secondary | ICD-10-CM | POA: Diagnosis not present

## 2021-01-01 LAB — COMPREHENSIVE METABOLIC PANEL
ALT: 54 U/L — ABNORMAL HIGH (ref 0–44)
AST: 44 U/L — ABNORMAL HIGH (ref 15–41)
Albumin: 4.1 g/dL (ref 3.5–5.0)
Alkaline Phosphatase: 77 U/L (ref 38–126)
Anion gap: 10 (ref 5–15)
BUN: 14 mg/dL (ref 8–23)
CO2: 26 mmol/L (ref 22–32)
Calcium: 9.4 mg/dL (ref 8.9–10.3)
Chloride: 104 mmol/L (ref 98–111)
Creatinine, Ser: 0.86 mg/dL (ref 0.61–1.24)
GFR, Estimated: >60 mL/min (ref >60)
Glucose, Bld: 117 mg/dL — ABNORMAL HIGH (ref 70–99)
Potassium: 3.6 mmol/L (ref 3.5–5.1)
Sodium: 140 mmol/L (ref 135–145)
Total Bilirubin: 0.8 mg/dL (ref 0.3–1.2)
Total Protein: 6.9 g/dL (ref 6.5–8.1)

## 2021-01-01 LAB — LIPID PANEL
Cholesterol: 120 mg/dL (ref 0–200)
HDL: 53 mg/dL (ref >40)
LDL Cholesterol: 58 mg/dL (ref 0–99)
Total CHOL/HDL Ratio: 2.3 RATIO
Triglycerides: 45 mg/dL (ref <150)
VLDL: 9 mg/dL (ref 0–40)

## 2021-01-01 LAB — CBC WITH DIFFERENTIAL/PLATELET
Abs Immature Granulocytes: 0.01 10*3/uL (ref 0.00–0.07)
Basophils Absolute: 0 10*3/uL (ref 0.0–0.1)
Basophils Relative: 0 %
Eosinophils Absolute: 0.1 10*3/uL (ref 0.0–0.5)
Eosinophils Relative: 2 %
HCT: 39.1 % (ref 39.0–52.0)
Hemoglobin: 13 g/dL (ref 13.0–17.0)
Immature Granulocytes: 0 %
Lymphocytes Relative: 21 %
Lymphs Abs: 1.1 10*3/uL (ref 0.7–4.0)
MCH: 31 pg (ref 26.0–34.0)
MCHC: 33.2 g/dL (ref 30.0–36.0)
MCV: 93.1 fL (ref 80.0–100.0)
Monocytes Absolute: 0.4 10*3/uL (ref 0.1–1.0)
Monocytes Relative: 8 %
Neutro Abs: 3.7 10*3/uL (ref 1.7–7.7)
Neutrophils Relative %: 69 %
Platelets: 232 10*3/uL (ref 150–400)
RBC: 4.2 MIL/uL — ABNORMAL LOW (ref 4.22–5.81)
RDW: 13.3 % (ref 11.5–15.5)
WBC: 5.3 10*3/uL (ref 4.0–10.5)
nRBC: 0 % (ref 0.0–0.2)

## 2021-01-01 LAB — TROPONIN I (HIGH SENSITIVITY)
Troponin I (High Sensitivity): 5 ng/L (ref <18)
Troponin I (High Sensitivity): 5 ng/L (ref <18)

## 2021-01-01 LAB — PROTIME-INR
INR: 1.1 (ref 0.8–1.2)
Prothrombin Time: 13.9 seconds (ref 11.4–15.2)

## 2021-01-01 LAB — APTT: aPTT: 33 seconds (ref 24–36)

## 2021-01-01 LAB — HEMOGLOBIN A1C
Hgb A1c MFr Bld: 5.6 % (ref 4.8–5.6)
Mean Plasma Glucose: 114.02 mg/dL

## 2021-01-01 LAB — RESP PANEL BY RT-PCR (FLU A&B, COVID) ARPGX2
Influenza A by PCR: NEGATIVE
Influenza B by PCR: NEGATIVE
SARS Coronavirus 2 by RT PCR: NEGATIVE

## 2021-01-01 NOTE — Discharge Instructions (Addendum)
Your EKG had some minor changes tonight however your heart attack enzyme tests were normal.  I have discussed your EKG and test with the cardiologist and they feel you can go home and follow-up with Dr. Lovena Le in the next week or 2.  Please call his office on Monday or Tuesday if they are closed because of the bad weather to get an appointment.  You did have some minor elevation of a couple of your liver tests and I do not have any old test to compare to.  Please call [phone] to get scheduled for an outpatient ultrasound to make sure you do not have gallstones.  Return to the emergency department if you feel worse again.

## 2021-01-02 MED ORDER — FUROSEMIDE 20 MG PO TABS: 20.0000 mg | ORAL_TABLET | Freq: Every day | ORAL | 3 refills | Status: AC

## 2021-01-03 ENCOUNTER — Encounter (HOSPITAL_COMMUNITY): Payer: Self-pay | Admitting: *Deleted

## 2021-01-03 ENCOUNTER — Other Ambulatory Visit: Payer: Self-pay

## 2021-01-03 ENCOUNTER — Observation Stay (HOSPITAL_COMMUNITY)
Admission: EM | Admit: 2021-01-03 | Discharge: 2021-01-04 | Disposition: A | Discharge status: Home / Self Care | Payer: Medicare Other | Attending: Family Medicine | Admitting: Family Medicine

## 2021-01-03 DIAGNOSIS — K5900 Constipation, unspecified: Secondary | ICD-10-CM | POA: Diagnosis not present

## 2021-01-03 DIAGNOSIS — Z7982 Long term (current) use of aspirin: Secondary | ICD-10-CM | POA: Insufficient documentation

## 2021-01-03 DIAGNOSIS — Z20822 Contact with and (suspected) exposure to covid-19: Secondary | ICD-10-CM | POA: Diagnosis not present

## 2021-01-03 DIAGNOSIS — K922 Gastrointestinal hemorrhage, unspecified: Principal | ICD-10-CM | POA: Diagnosis present

## 2021-01-03 DIAGNOSIS — N400 Enlarged prostate without lower urinary tract symptoms: Secondary | ICD-10-CM | POA: Diagnosis not present

## 2021-01-03 DIAGNOSIS — Z96653 Presence of artificial knee joint, bilateral: Secondary | ICD-10-CM | POA: Insufficient documentation

## 2021-01-03 DIAGNOSIS — D72829 Elevated white blood cell count, unspecified: Secondary | ICD-10-CM | POA: Diagnosis not present

## 2021-01-03 DIAGNOSIS — I25119 Atherosclerotic heart disease of native coronary artery with unspecified angina pectoris: Secondary | ICD-10-CM | POA: Diagnosis present

## 2021-01-03 DIAGNOSIS — Z87891 Personal history of nicotine dependence: Secondary | ICD-10-CM | POA: Diagnosis not present

## 2021-01-03 DIAGNOSIS — I482 Chronic atrial fibrillation, unspecified: Secondary | ICD-10-CM

## 2021-01-03 DIAGNOSIS — Z79899 Other long term (current) drug therapy: Secondary | ICD-10-CM | POA: Diagnosis not present

## 2021-01-03 DIAGNOSIS — Z7901 Long term (current) use of anticoagulants: Secondary | ICD-10-CM | POA: Diagnosis not present

## 2021-01-03 DIAGNOSIS — R739 Hyperglycemia, unspecified: Secondary | ICD-10-CM | POA: Diagnosis not present

## 2021-01-03 DIAGNOSIS — K219 Gastro-esophageal reflux disease without esophagitis: Secondary | ICD-10-CM | POA: Diagnosis not present

## 2021-01-03 LAB — BASIC METABOLIC PANEL
Anion gap: 9 (ref 5–15)
BUN: 17 mg/dL (ref 8–23)
CO2: 25 mmol/L (ref 22–32)
Calcium: 9.5 mg/dL (ref 8.9–10.3)
Chloride: 106 mmol/L (ref 98–111)
Creatinine, Ser: 1.07 mg/dL (ref 0.61–1.24)
GFR, Estimated: >60 mL/min (ref >60)
Glucose, Bld: 139 mg/dL — ABNORMAL HIGH (ref 70–99)
Potassium: 3.8 mmol/L (ref 3.5–5.1)
Sodium: 140 mmol/L (ref 135–145)

## 2021-01-03 LAB — CBC WITH DIFFERENTIAL/PLATELET
Abs Immature Granulocytes: 0.04 10*3/uL (ref 0.00–0.07)
Basophils Absolute: 0 10*3/uL (ref 0.0–0.1)
Basophils Relative: 0 %
Eosinophils Absolute: 0 10*3/uL (ref 0.0–0.5)
Eosinophils Relative: 0 %
HCT: 39.7 % (ref 39.0–52.0)
Hemoglobin: 12.9 g/dL — ABNORMAL LOW (ref 13.0–17.0)
Immature Granulocytes: 0 %
Lymphocytes Relative: 5 %
Lymphs Abs: 0.7 10*3/uL (ref 0.7–4.0)
MCH: 30.6 pg (ref 26.0–34.0)
MCHC: 32.5 g/dL (ref 30.0–36.0)
MCV: 94.1 fL (ref 80.0–100.0)
Monocytes Absolute: 0.7 10*3/uL (ref 0.1–1.0)
Monocytes Relative: 6 %
Neutro Abs: 11.4 10*3/uL — ABNORMAL HIGH (ref 1.7–7.7)
Neutrophils Relative %: 89 %
Platelets: 249 10*3/uL (ref 150–400)
RBC: 4.22 MIL/uL (ref 4.22–5.81)
RDW: 13.6 % (ref 11.5–15.5)
WBC: 12.8 10*3/uL — ABNORMAL HIGH (ref 4.0–10.5)
nRBC: 0 % (ref 0.0–0.2)

## 2021-01-03 LAB — CBC
HCT: 38.2 % — ABNORMAL LOW (ref 39.0–52.0)
Hemoglobin: 12.6 g/dL — ABNORMAL LOW (ref 13.0–17.0)
MCH: 31.1 pg (ref 26.0–34.0)
MCHC: 33 g/dL (ref 30.0–36.0)
MCV: 94.3 fL (ref 80.0–100.0)
Platelets: 239 10*3/uL (ref 150–400)
RBC: 4.05 MIL/uL — ABNORMAL LOW (ref 4.22–5.81)
RDW: 13.4 % (ref 11.5–15.5)
WBC: 13.9 10*3/uL — ABNORMAL HIGH (ref 4.0–10.5)
nRBC: 0 % (ref 0.0–0.2)

## 2021-01-03 MED ORDER — BISACODYL 10 MG RE SUPP
10.0000 mg | Freq: Once | RECTAL | Status: AC
  Administered 2021-01-03: 10 mg via RECTAL
  Filled 2021-01-03: qty 1

## 2021-01-03 NOTE — ED Triage Notes (Signed)
Pt with urinary retention, last voided last night around 2200 per pt. And pt also c/o constipation, LBM 5 days ago per pt. Pt gave himself an enema 2-3 hours ago. Pt noted blood coming from rectum and pt is on blood thinner Eliquis.

## 2021-01-03 NOTE — ED Provider Notes (Signed)
[facility] EMERGENCY DEPARTMENT Provider Note   CSN: [id number] Arrival date & time: 01/03/21  1349     History Chief Complaint  Patient presents with  . Rectal Bleeding    RENEE BEALE is a 75 y.o. male.  HPI    75 year old male with history of A. fib on Eliquis, DVT, diverticulosis comes in a chief complaint of bloody stools.  Patient also has history of severe constipation.  He reports that he felt constipated and was straining hard at home.  Soon after he started having large amount of bloody stools.  He has had multiple bloody stools today and thinks he might of lost about a pint of blood prior to ED arrival.  The blood is dark red.  He has been taking his blood thinners as prescribed.    Past Medical History:  Diagnosis Date  . A-fib ([place])   . Arthritis    "all over my body" (06/17/2017)  . BPH (benign prostatic hyperplasia)    takes Proscar daily  . Chronic back pain    DDD; "upper and lower" (06/17/2017)  . Constipation    takes Colace daily  . DDD (degenerative disc disease), cervical   . DVT (deep venous thrombosis) ([facility])    left peroneal vein 07/2017 (in setting of recent left TKA)  . Dysrhythmia    history of Atrial fibrillation, no long has this  . Enlarged prostate   . GERD (gastroesophageal reflux disease)    takes Nexium daily  . H/O hiatal hernia   . History of colon polyps    benign  . History of gastric ulcer   . Insomnia    takes Xanax nightly  . Joint capsule tear    left knee  . Joint pain   . Joint swelling   . Macular degeneration   . Muscle spasm of back    takes Valium daily as needed  . PAF (paroxysmal atrial fibrillation) ([place])   . Peripheral neuropathy    takes Gabapentin daily  . PONV (postoperative nausea and vomiting)   . Sleep apnea    pt does not wear a cpap  . Urinary hesitancy   . Weakness    numbness and tingling    Patient Active Problem List   Diagnosis Date Noted  . Constipation 08/23/2020  . Tiredness  08/23/2020  . Nocturia 06/30/2020  . Abdominal distension 08/18/2019  . Coronary artery disease involving native coronary artery of native heart with angina pectoris ([place])   . Upper airway cough syndrome 01/05/2019  . DOE (dyspnea on exertion) 12/15/2018  . Gastroesophageal reflux disease 04/28/2018  . Hx of colonic polyps 04/28/2018  . Barrett's esophagus without dysplasia 04/15/2018  . Gastroesophageal reflux disease without esophagitis 04/15/2018  . History of colonic polyps 04/15/2018  . Atrial fibrillation with RVR ([place]) 07/23/2017  . History of gastric ulcer   . Chronic back pain   . BPH (benign prostatic hyperplasia)   . 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 Chronic pain of right knee 05/14/2017  . Spondylolisthesis of lumbar region 03/05/2014  . Barrett esophagus 02/24/2013  . Personal history of colonic polyps 02/24/2013  . Epigastric pain 01/06/2013  . Encounter for screening colonoscopy 01/06/2013  . Pain in joint, shoulder region 07/09/2012  . Muscle weakness (generalized) 07/09/2012    Past Surgical History:  Procedure Laterality Date  . ANTERIOR CERVICAL DECOMP/DISCECTOMY FUSION  ~ 1991   "took bone from hip"  .  ANTERIOR CERVICAL DECOMP/DISCECTOMY FUSION  X 2   "put plate in"  . ANTERIOR CERVICAL DISCECTOMY  <11/2005    5-6 and 4-5./notes 05/02/2011  . APPENDECTOMY    . BACK SURGERY    . BIOPSY  05/30/2018   Procedure: BIOPSY;  Surgeon: Malissa Hippo, MD;  Location: [facility] ENDO SUITE;  Service: Endoscopy;;  esophagus  . CARDIAC CATHETERIZATION    . CARPAL TUNNEL RELEASE Bilateral 10/2005 - 11/2005   right-left Hattie Perch 05/02/2011  . CATARACT EXTRACTION W/PHACO Right 05/24/2014   Procedure: CATARACT EXTRACTION PHACO AND INTRAOCULAR LENS PLACEMENT (IOC);  Surgeon: Gemma Payor, MD;  Location: [facility] ORS;  Service: Ophthalmology;  Laterality: Right;  CDE 6.74  . CATARACT EXTRACTION W/PHACO Left 06/17/2014   Procedure:  CATARACT EXTRACTION PHACO AND INTRAOCULAR LENS PLACEMENT (IOC);  Surgeon: Gemma Payor, MD;  Location: [facility] ORS;  Service: Ophthalmology;  Laterality: Left;  CDE:4.65  . COLONOSCOPY WITH ESOPHAGOGASTRODUODENOSCOPY (EGD) N/A 01/22/2013   Procedure: COLONOSCOPY WITH ESOPHAGOGASTRODUODENOSCOPY (EGD);  Surgeon: Malissa Hippo, MD;  Location: [facility];  Service: Endoscopy;  Laterality: N/A;  140  . COLONOSCOPY WITH PROPOFOL N/A 05/30/2018   Procedure: COLONOSCOPY WITH PROPOFOL;  Surgeon: Malissa Hippo, MD;  Location: [facility];  Service: Endoscopy;  Laterality: N/A;  . ESOPHAGOGASTRODUODENOSCOPY (EGD) WITH PROPOFOL N/A 05/30/2018   Procedure: ESOPHAGOGASTRODUODENOSCOPY (EGD) WITH PROPOFOL;  Surgeon: Malissa Hippo, MD;  Location: [facility];  Service: Endoscopy;  Laterality: N/A;  . EYE SURGERY Bilateral    "cleaned film w/laser; since my 1st cataract OR"  . JOINT REPLACEMENT    . LEFT HEART CATH AND CORONARY ANGIOGRAPHY N/A 02/16/2019   Procedure: LEFT HEART CATH AND CORONARY ANGIOGRAPHY;  Surgeon: Lyn Records, MD;  Location: [facility] INVASIVE CV LAB;  Service: Cardiovascular;  Laterality: N/A;  . POSTERIOR LUMBAR FUSION     "put a plate in"  . QUADRICEPS TENDON REPAIR Left 07/01/2017  . QUADRICEPS TENDON REPAIR Left 07/01/2017   Procedure: REPAIR QUADRICEP TENDON;  Surgeon: Dannielle Huh, MD;  Location: [facility] OR;  Service: Orthopedics;  Laterality: Left;  . SHOULDER ARTHROSCOPY Bilateral   . SPINAL CORD STIMULATOR INSERTION N/A 09/23/2015   Procedure: LUMBAR SPINAL CORD STIMULATOR INSERTION;  Surgeon: Odette Fraction, MD;  Location: [facility] NEURO ORS;  Service: Neurosurgery;  Laterality: N/A;  LUMBAR SPINAL CORD STIMULATOR INSERTION  . TOTAL KNEE ARTHROPLASTY Left 06/17/2017  . TOTAL KNEE ARTHROPLASTY Left 06/17/2017   Procedure: TOTAL KNEE ARTHROPLASTY;  Surgeon: Dannielle Huh, MD;  Location: [facility] OR;  Service: Orthopedics;  Laterality: Left;  . TOTAL KNEE ARTHROPLASTY Right 12/16/2017   Procedure: TOTAL  KNEE ARTHROPLASTY;  Surgeon: Dannielle Huh, MD;  Location: [facility] OR;  Service: Orthopedics;  Laterality: Right;       History reviewed. No pertinent family history.  Social History   Tobacco Use  . Smoking status: Former Smoker    Packs/day: 1.00    Years: 30.00    Pack years: 30.00    Types: Cigarettes    Quit date: 01/14/1987    Years since quitting: 33.9  . Smokeless tobacco: Former Neurosurgeon    Types: Chew  . Tobacco comment: quit smoking  & chewing by 1987  Vaping Use  . Vaping Use: Never used  Substance Use Topics  . Alcohol use: Yes    Alcohol/week: 15.0 standard drinks    Types: 3 Glasses of wine, 12 Cans of beer per week    Comment: 2-3 times weekly   . Drug use: No  Home Medications Prior to Admission medications   Medication Sig Start Date End Date Taking? Authorizing Provider  alfuzosin (UROXATRAL) 10 MG 24 hr tablet Take 1 tablet (10 mg total) by mouth daily with breakfast. 07/26/20   McKenzie, Candee Furbish, MD  ALPRAZolam Duanne Moron) 1 MG tablet Take 1 mg by mouth at bedtime. 09/27/17   [provider]  amiodarone (PACERONE) 200 MG tablet Take 200 mg by mouth daily.    [provider]  apixaban (ELIQUIS) 5 MG TABS tablet Take 1 tablet (5 mg total) by mouth 2 (two) times daily. 05/31/18   [name], MD  aspirin EC 81 MG tablet Take 1 tablet (81 mg total) by mouth daily. 02/11/19   Richardson Dopp T, PA-C  atorvastatin (LIPITOR) 80 MG tablet TAKE 1 TABLET BY MOUTH EVERY DAY 01/02/21   Belva Crome, MD  Calcium Polycarbophil (FIBER-CAPS PO) Take 2 capsules by mouth daily.    [provider]  Cholecalciferol (VITAMIN D3) 10 MCG (400 UNIT) CAPS Take 400 Units by mouth daily.    [provider]  Cyanocobalamin (B-12 PO) Take by mouth daily.    [provider]  docusate sodium (COLACE) 250 MG capsule Take 250 mg by mouth daily.    [provider]  finasteride (PROSCAR) 5 MG tablet Take 1 tablet (5 mg total) by mouth daily.  06/30/20   McKenzie, Candee Furbish, MD  furosemide (LASIX) 20 MG tablet Take 1 tablet (20 mg total) by mouth daily. 01/02/21   Belva Crome, MD  gabapentin (NEURONTIN) 400 MG capsule One three times a day Patient taking differently: Take 400 mg by mouth 3 (three) times daily. One three times a day 01/05/19   Tanda Rockers, MD  HYDROcodone-acetaminophen [facility]) 10-325 MG tablet Take 1 tablet by mouth every 8 (eight) hours as needed for moderate pain.     [provider]  isosorbide mononitrate (IMDUR) 60 MG 24 hr tablet Take 60 mg by mouth daily.    [provider]  metoprolol tartrate (LOPRESSOR) 25 MG tablet Take 25 mg by mouth 2 (two) times daily.    [provider]  nitroGLYCERIN (NITROSTAT) 0.4 MG SL tablet Place 1 tablet (0.4 mg total) under the tongue every 5 (five) minutes as needed for chest pain. 08/26/19   Belva Crome, MD  Omega-3 1000 MG CAPS Take by mouth.    [provider]  pantoprazole (PROTONIX) 40 MG tablet TAKE 1 TABLET BY MOUTH TWICE A DAY (30 TO 60 MINUTES BEFORE 1ST AND LAST MEALS OF THE DAY) 10/20/19   Tanda Rockers, MD  polyethylene glycol powder (GLYCOLAX/MIRALAX) 17 GM/SCOOP powder Take 8.5 g by mouth daily. 08/23/20   [name], MD  potassium chloride (KLOR-CON) 10 MEQ tablet Take by mouth. 09/29/19   [provider]  silodosin (RAPAFLO) 8 MG CAPS capsule Take 8 mg by mouth daily with breakfast.    [provider]  Simethicone 180 MG CAPS Take 1 capsule (180 mg total) by mouth 3 (three) times daily as needed. 08/18/19   Rehman, Mechele Dawley, MD  tamsulosin (FLOMAX) 0.4 MG CAPS capsule Take 0.4 mg by mouth at bedtime. Patient not taking: Reported on 12/19/2020    [provider]  vitamin E 200 UNIT capsule Take 200 Units by mouth daily.    [provider]    Allergies    Morphine, Tylenol [acetaminophen], Morphine and related, and Propofol  Review of Systems   Review of Systems  Constitutional:  Positive for activity change.  Respiratory: Negative for shortness of breath.   Cardiovascular: Negative for chest pain.  Neurological: Negative for dizziness.  Hematological: Bruises/bleeds easily.  All other systems reviewed and are negative.   Physical Exam Updated Vital Signs BP 130/82 (BP Location: Right Arm)   Pulse 76   Temp 98.3 F (36.8 C) (Oral)   Resp 17   Ht 5\' 8"  (1.727 m)   Wt 77.1 kg   SpO2 95%   BMI 25.85 kg/m   Physical Exam Vitals and nursing note reviewed.  Constitutional:      Appearance: He is well-developed.  HENT:     Head: Atraumatic.  Cardiovascular:     Rate and Rhythm: Normal rate.  Pulmonary:     Effort: Pulmonary effort is normal.  Abdominal:     Tenderness: There is no abdominal tenderness.     Comments: Grossly bloody stools on rectal exam  Musculoskeletal:     Cervical back: Neck supple.  Skin:    General: Skin is warm.  Neurological:     Mental Status: He is alert and oriented to person, place, and time.     ED Results / Procedures / Treatments   Labs (all labs ordered are listed, but only abnormal results are displayed) Labs Reviewed  CBC WITH DIFFERENTIAL/PLATELET - Abnormal; Notable for the following components:      Result Value   WBC 12.8 (*)    Hemoglobin 12.9 (*)    Neutro Abs 11.4 (*)    All other components within normal limits  BASIC METABOLIC PANEL - Abnormal; Notable for the following components:   Glucose, Bld 139 (*)    All other components within normal limits  SARS CORONAVIRUS 2 (TAT 6-24 HRS)  CBC    EKG None  Radiology No results found.  Procedures Fecal disimpaction  Date/Time: 01/03/2021 11:08 PM Performed by: Varney Biles, MD Authorized by: Varney Biles, MD  Consent: Verbal consent obtained. Risks and benefits: risks, benefits and alternatives were discussed Consent given by: patient Patient understanding: patient states understanding of the procedure being performed Patient identity  confirmed: arm band Time out: Immediately prior to procedure a "time out" was called to verify the correct patient, procedure, equipment, support staff and site/side marked as required. Local anesthesia used: no  Anesthesia: Local anesthesia used: no  Sedation: Patient sedated: no  Patient tolerance: patient tolerated the procedure well with no immediate complications    (including critical care time)  Medications Ordered in ED Medications  bisacodyl (DULCOLAX) suppository 10 mg (10 mg Rectal Given 01/03/21 2202)    ED Course  I have reviewed the triage vital signs and the nursing notes.  Pertinent labs & imaging results that were available during my care of the patient were reviewed by me and considered in my medical decision making (see chart for details).    MDM Rules/Calculators/A&P                          75 year old male comes in a chief complaint of bloody stool.  He has history of diverticulosis per colonoscopy from 2019.  He is on Eliquis because of A. fib, DVT history.  It appears that patient has severe constipation.  Straining led to large amount of blood loss.  Digital rectal exam revealed large stool in the rectal vault.  Stool was mixed with blood.  Patient is on blood thinner and family is not comfortable with him going home  with the amount of blood loss he had.  We will admit him to the hospital for GI bleed and surveillance.  Initial hemoglobin is normal.  Final Clinical Impression(s) / ED Diagnoses Final diagnoses:  Acute lower GI bleeding    Rx / DC Orders ED Discharge Orders    None       Varney Biles, MD 01/03/21 2310

## 2021-01-03 NOTE — H&P (Signed)
History and Physical  Christopher [name] [id number] DOB: 08-29-1946 DOA: 01/03/2021  Referring physician: Derwood KaplanNanavati, Ankit, MD PCP: Benita StabileHall, John Z, MD  Patient coming from: Home  Chief Complaint: Rectal bleed  HPI: Christopher [name] is a 75 y.o. male with medical history significant for A. fib on Eliquis, DVT, CAD s/p CABG, hyperlipidemia, GERD, BPH, insomnia, constipation, diverticulosis who presents to the emergency department due to complaint of bloody stools noted at home today.  Patient states that he felt constipated and that his last bowel movement was a week ago, he went to use restroom after using enema, while on commode, he states that he strained hard and shortly after this, he noted large amount of bright red blood with stool and that he has had a couple of bloody stools today which makes him think he could have lost up to a pint of blood.  He activated EMS and patient was sent to the ED for further evaluation.  He denies fever, chills, shortness of breath, nausea or vomiting.  ED Course:  In the emergency department, he was hemodynamically stable.  Work-up in the ED showed leukocytosis, hyperglycemia.  Fecal disimpaction was done in the ED.  CBC was repeated without significant drop in hemoglobin.  Dulcolax was given rectally in the ED.  Hospitalist was asked admit patient for further evaluation and management.  Review of Systems:  Constitutional: Negative for chills and fever.  HENT: Negative for ear pain and sore throat.   Eyes: Negative for pain and visual disturbance.  Respiratory: Negative for cough, chest tightness and shortness of breath.   Cardiovascular: Negative for chest pain and palpitations.  Gastrointestinal: Positive for blood in stool.  Negative for abdominal pain and vomiting.  Endocrine: Negative for polyphagia and polyuria.  Genitourinary: Negative for decreased urine volume, dysuria, enuresis Musculoskeletal: Negative for arthralgias and back pain.  Skin: Negative for  color change and rash.  Allergic/Immunologic: Negative for immunocompromised state.  Neurological: Negative for tremors, syncope, speech difficulty, weakness, light-headedness and headaches.  Hematological: Does not bruise/bleed easily.  All other systems reviewed and are negative   Past Medical History:  Diagnosis Date  . A-fib (HCC)   . Arthritis    "all over my body" (06/17/2017)  . BPH (benign prostatic hyperplasia)    takes Proscar daily  . Chronic back pain    DDD; "upper and lower" (06/17/2017)  . Constipation    takes Colace daily  . DDD (degenerative disc disease), cervical   . DVT (deep venous thrombosis) (HCC)    left peroneal vein 07/2017 (in setting of recent left TKA)  . Dysrhythmia    history of Atrial fibrillation, no long has this  . Enlarged prostate   . GERD (gastroesophageal reflux disease)    takes Nexium daily  . H/O hiatal hernia   . History of colon polyps    benign  . History of gastric ulcer   . Insomnia    takes Xanax nightly  . Joint capsule tear    left knee  . Joint pain   . Joint swelling   . Macular degeneration   . Muscle spasm of back    takes Valium daily as needed  . PAF (paroxysmal atrial fibrillation) (HCC)   . Peripheral neuropathy    takes Gabapentin daily  . PONV (postoperative nausea and vomiting)   . Sleep apnea    pt does not wear a cpap  . Urinary hesitancy   . Weakness    numbness and tingling  Past Surgical History:  Procedure Laterality Date  . ANTERIOR CERVICAL DECOMP/DISCECTOMY FUSION  ~ 1991   "took bone from hip"  . ANTERIOR CERVICAL DECOMP/DISCECTOMY FUSION  X 2   "put plate in"  . ANTERIOR CERVICAL DISCECTOMY  <11/2005    5-6 and 4-5./notes 05/02/2011  . APPENDECTOMY    . BACK SURGERY    . BIOPSY  05/30/2018   Procedure: BIOPSY;  Surgeon: [name], MD;  [facility];  Service: Endoscopy;;  esophagus  . CARDIAC CATHETERIZATION    . CARPAL TUNNEL RELEASE Bilateral 10/2005 - 11/2005    right-left [facility] 05/02/2011  . CATARACT EXTRACTION W/PHACO Right 05/24/2014   Procedure: CATARACT EXTRACTION PHACO AND INTRAOCULAR LENS PLACEMENT (IOC);  Surgeon: [name], MD;  Location: [facility];  Service: Ophthalmology;  Laterality: Right;  CDE 6.74  . CATARACT EXTRACTION W/PHACO Left 06/17/2014   Procedure: CATARACT EXTRACTION PHACO AND INTRAOCULAR LENS PLACEMENT (IOC);  Surgeon: [name], MD;  Location: [facility];  Service: Ophthalmology;  Laterality: Left;  CDE:4.65  . COLONOSCOPY WITH ESOPHAGOGASTRODUODENOSCOPY (EGD) N/A 01/22/2013   Procedure: COLONOSCOPY WITH ESOPHAGOGASTRODUODENOSCOPY (EGD);  Surgeon: [name], MD;  [facility];  Service: Endoscopy;  Laterality: N/A;  140  . COLONOSCOPY WITH PROPOFOL N/A 05/30/2018   Procedure: COLONOSCOPY WITH PROPOFOL;  Surgeon: [name], MD;  [facility];  Service: Endoscopy;  Laterality: N/A;  . ESOPHAGOGASTRODUODENOSCOPY (EGD) WITH PROPOFOL N/A 05/30/2018   Procedure: ESOPHAGOGASTRODUODENOSCOPY (EGD) WITH PROPOFOL;  Surgeon: [name], MD;  [facility];  Service: Endoscopy;  Laterality: N/A;  . EYE SURGERY Bilateral    "cleaned film w/laser; since my 1st cataract OR"  . JOINT REPLACEMENT    . LEFT HEART CATH AND CORONARY ANGIOGRAPHY N/A 02/16/2019   Procedure: LEFT HEART CATH AND CORONARY ANGIOGRAPHY;  Surgeon: Belva Crome, MD;  Location: [facility];  Service: Cardiovascular;  Laterality: N/A;  . POSTERIOR LUMBAR FUSION     "put a plate in"  . QUADRICEPS TENDON REPAIR Left 07/01/2017  . QUADRICEPS TENDON REPAIR Left 07/01/2017   Procedure: REPAIR QUADRICEP TENDON;  Surgeon: [name], MD;  Location: [place];  Service: Orthopedics;  Laterality: Left;  . SHOULDER ARTHROSCOPY Bilateral   . SPINAL CORD STIMULATOR INSERTION N/A 09/23/2015   Procedure: LUMBAR SPINAL CORD STIMULATOR INSERTION;  Surgeon: [name], MD;  Location: [facility];  Service: Neurosurgery;  Laterality: N/A;  LUMBAR  SPINAL CORD STIMULATOR INSERTION  . TOTAL KNEE ARTHROPLASTY Left 06/17/2017  . TOTAL KNEE ARTHROPLASTY Left 06/17/2017   Procedure: TOTAL KNEE ARTHROPLASTY;  Surgeon: [name], MD;  Location: [place];  Service: Orthopedics;  Laterality: Left;  . TOTAL KNEE ARTHROPLASTY Right 12/16/2017   Procedure: TOTAL KNEE ARTHROPLASTY;  Surgeon: [name], MD;  Location: [place];  Service: Orthopedics;  Laterality: Right;    Social History:  reports that he quit smoking about 33 years ago. His smoking use included cigarettes. He has a 30.00 pack-year smoking history. He has quit using smokeless tobacco.  His smokeless tobacco use included chew. He reports current alcohol use of about 15.0 standard drinks of alcohol per week. He reports that he does not use drugs.   Allergies  Allergen Reactions  . Morphine Nausea And Vomiting  . Tylenol [Acetaminophen]     Stomach pain   . Morphine And Related Nausea And Vomiting  . Propofol Nausea And Vomiting    History reviewed. No pertinent family history.   Prior  to Admission medications   Medication Sig Start Date End Date Taking? Authorizing Provider  alfuzosin (UROXATRAL) 10 MG 24 hr tablet Take 1 tablet (10 mg total) by mouth daily with breakfast. 07/26/20   McKenzie, Candee Furbish, MD  ALPRAZolam Duanne Moron) 1 MG tablet Take 1 mg by mouth at bedtime. 09/27/17   [provider]  amiodarone (PACERONE) 200 MG tablet Take 200 mg by mouth daily.    [provider]  apixaban (ELIQUIS) 5 MG TABS tablet Take 1 tablet (5 mg total) by mouth 2 (two) times daily. 05/31/18   [name], MD  aspirin EC 81 MG tablet Take 1 tablet (81 mg total) by mouth daily. 02/11/19   Richardson Dopp T, PA-C  atorvastatin (LIPITOR) 80 MG tablet TAKE 1 TABLET BY MOUTH EVERY DAY 01/02/21   Belva Crome, MD  Calcium Polycarbophil (FIBER-CAPS PO) Take 2 capsules by mouth daily.    [provider]  Cholecalciferol (VITAMIN D3) 10 MCG (400 UNIT) CAPS Take 400 Units by  mouth daily.    [provider]  Cyanocobalamin (B-12 PO) Take by mouth daily.    [provider]  docusate sodium (COLACE) 250 MG capsule Take 250 mg by mouth daily.    [provider]  finasteride (PROSCAR) 5 MG tablet Take 1 tablet (5 mg total) by mouth daily. 06/30/20   McKenzie, Candee Furbish, MD  furosemide (LASIX) 20 MG tablet Take 1 tablet (20 mg total) by mouth daily. 01/02/21   Belva Crome, MD  gabapentin (NEURONTIN) 400 MG capsule One three times a day Patient taking differently: Take 400 mg by mouth 3 (three) times daily. One three times a day 01/05/19   Tanda Rockers, MD  HYDROcodone-acetaminophen [facility]) 10-325 MG tablet Take 1 tablet by mouth every 8 (eight) hours as needed for moderate pain.     [provider]  isosorbide mononitrate (IMDUR) 60 MG 24 hr tablet Take 60 mg by mouth daily.    [provider]  metoprolol tartrate (LOPRESSOR) 25 MG tablet Take 25 mg by mouth 2 (two) times daily.    [provider]  nitroGLYCERIN (NITROSTAT) 0.4 MG SL tablet Place 1 tablet (0.4 mg total) under the tongue every 5 (five) minutes as needed for chest pain. 08/26/19   Belva Crome, MD  Omega-3 1000 MG CAPS Take by mouth.    [provider]  pantoprazole (PROTONIX) 40 MG tablet TAKE 1 TABLET BY MOUTH TWICE A DAY (30 TO 60 MINUTES BEFORE 1ST AND LAST MEALS OF THE DAY) 10/20/19   Tanda Rockers, MD  polyethylene glycol powder (GLYCOLAX/MIRALAX) 17 GM/SCOOP powder Take 8.5 g by mouth daily. 08/23/20   [name], MD  potassium chloride (KLOR-CON) 10 MEQ tablet Take by mouth. 09/29/19   [provider]  silodosin (RAPAFLO) 8 MG CAPS capsule Take 8 mg by mouth daily with breakfast.    [provider]  Simethicone 180 MG CAPS Take 1 capsule (180 mg total) by mouth 3 (three) times daily as needed. 08/18/19   Rehman, Mechele Dawley, MD  tamsulosin (FLOMAX) 0.4 MG CAPS capsule Take 0.4 mg by mouth at bedtime. Patient not taking:  Reported on 12/19/2020    [provider]  vitamin E 200 UNIT capsule Take 200 Units by mouth daily.    [provider]    Physical Exam: BP 125/82 (BP Location: Right Arm)   Pulse 72   Temp 98.2 F (36.8 C) (Oral)   Resp [name])  21   Ht 5\' 8"  (1.727 m)   Wt 77.1 kg   SpO2 94%   BMI 25.85 kg/m   . General: 75 y.o. year-old male well developed well nourished in no acute distress.  Alert and oriented x3. [name] HEENT: NCAT, EOMI . Neck: Supple, trachea medial . Cardiovascular: Regular rate and rhythm with no rubs or gallops.  No thyromegaly or JVD noted.  No lower extremity edema. 2/4 pulses in all 4 extremities. [name] Respiratory: Clear to auscultation with no wheezes or rales. Good inspiratory effort. . Abdomen: Bloody stool noted on rectal exam.  Soft, nontender, nondistended with normal bowel sounds x4 quadrants. . Muskuloskeletal: No cyanosis, clubbing or edema noted bilaterally . Neuro: CN II-XII intact, strength, sensation, reflexes intact . Skin: No ulcerative lesions noted or rashes . Psychiatry: Judgement and insight appear normal. Mood is appropriate for condition and setting          Labs on Admission:  Basic Metabolic Panel: Recent Labs  Lab 12/31/20 2351 01/03/21 2059  NA 140 140  K 3.6 3.8  CL 104 106  CO2 26 25  GLUCOSE 117* 139*  BUN 14 17  CREATININE 0.86 1.07  CALCIUM 9.4 9.5   Liver Function Tests: Recent Labs  Lab 12/31/20 2351  AST 44*  ALT 54*  ALKPHOS 77  BILITOT 0.8  PROT 6.9  ALBUMIN 4.1   No results for input(s): LIPASE, AMYLASE in the last 168 hours. No results for input(s): AMMONIA in the last 168 hours. CBC: Recent Labs  Lab 12/31/20 2351 01/03/21 2059 01/03/21 2243  WBC 5.3 12.8* 13.9*  NEUTROABS 3.7 11.4*  --   HGB 13.0 12.9* 12.6*  HCT 39.1 39.7 38.2*  MCV 93.1 94.1 94.3  PLT 232 249 239   Cardiac Enzymes: No results for input(s): CKTOTAL, CKMB, CKMBINDEX, TROPONINI in the last 168 hours.  BNP (last 3  results) No results for input(s): BNP in the last 8760 hours.  ProBNP (last 3 results) No results for input(s): PROBNP in the last 8760 hours.  CBG: No results for input(s): GLUCAP in the last 168 hours.  Radiological Exams on Admission: No results found.  EKG: I independently viewed the EKG done and my findings are as followed: EKG was not done in ED  Assessment/Plan Present on Admission: . GI bleed . BPH (benign prostatic hyperplasia) . Gastroesophageal reflux disease without esophagitis . Constipation . Coronary artery disease involving native coronary artery of native heart with angina pectoris (HCC)  Principal Problem:   GI bleed Active Problems:   BPH (benign prostatic hyperplasia)   Gastroesophageal reflux disease without esophagitis   Coronary artery disease involving native coronary artery of native heart with angina pectoris (HCC)   Constipation   Leukocytosis   Hyperglycemia   Atrial fibrillation, chronic (HCC)  Acute GI bleed H/H= 12.9/39.7, this was 13.3 on 08/23/2020 Bright red blood noted on rectal exam No indication for blood transfusion at this time  Eliquis will be held at this time Gastroenterologist  will be consulted in the morning and we will await further recommendation  GERD Continue Protonix  Leukocytosis/hyperglycemia possibly reactive WBC 12.8; CBG 139 No acute concern for infectious process at Continue to monitor WBC and CBG with morning labs  Constipation Continue docusate  Chronic atrial fibrillation Continue amiodarone, Lopressor Eliquis will be held at this time due to GI bleed  CAD S/P CABG Continue aspirin, statin, Lopressor, Imdur Continue Nitrostat as needed  Hyperlipidemia Continue Lipitor  Insomnia Continue Xanax per home  regimen  BPH Continue finasteride and Flomax   DVT prophylaxis: SCDs  Code Status: Full code  Family Communication: None at bedside  Disposition Plan:  Patient is from:                         home Anticipated DC to:                   SNF or family members home Anticipated DC date:               2-3 days Anticipated DC barriers:          Patient is not stable to be discharged at this time due to GI bleed that require inpatient management   Consults called: Gastroenterology  Admission status: Observation   Bernadette Hoit MD [facility]  01/04/2021, 3:55 AM

## 2021-01-04 ENCOUNTER — Telehealth: Payer: Self-pay | Admitting: Gastroenterology

## 2021-01-04 DIAGNOSIS — I482 Chronic atrial fibrillation, unspecified: Secondary | ICD-10-CM

## 2021-01-04 DIAGNOSIS — R739 Hyperglycemia, unspecified: Secondary | ICD-10-CM

## 2021-01-04 DIAGNOSIS — D72829 Elevated white blood cell count, unspecified: Secondary | ICD-10-CM

## 2021-01-04 LAB — CBC
HCT: 37 % — ABNORMAL LOW (ref 39.0–52.0)
Hemoglobin: 12.1 g/dL — ABNORMAL LOW (ref 13.0–17.0)
MCH: 30.8 pg (ref 26.0–34.0)
MCHC: 32.7 g/dL (ref 30.0–36.0)
MCV: 94.1 fL (ref 80.0–100.0)
Platelets: 235 10*3/uL (ref 150–400)
RBC: 3.93 MIL/uL — ABNORMAL LOW (ref 4.22–5.81)
RDW: 13.5 % (ref 11.5–15.5)
WBC: 14 10*3/uL — ABNORMAL HIGH (ref 4.0–10.5)
nRBC: 0 % (ref 0.0–0.2)

## 2021-01-04 LAB — COMPREHENSIVE METABOLIC PANEL
ALT: 39 U/L (ref 0–44)
AST: 30 U/L (ref 15–41)
Albumin: 3.7 g/dL (ref 3.5–5.0)
Alkaline Phosphatase: 67 U/L (ref 38–126)
Anion gap: 8 (ref 5–15)
BUN: 14 mg/dL (ref 8–23)
CO2: 25 mmol/L (ref 22–32)
Calcium: 8.8 mg/dL — ABNORMAL LOW (ref 8.9–10.3)
Chloride: 106 mmol/L (ref 98–111)
Creatinine, Ser: 0.95 mg/dL (ref 0.61–1.24)
GFR, Estimated: >60 mL/min (ref >60)
Glucose, Bld: 128 mg/dL — ABNORMAL HIGH (ref 70–99)
Potassium: 3.5 mmol/L (ref 3.5–5.1)
Sodium: 139 mmol/L (ref 135–145)
Total Bilirubin: 0.5 mg/dL (ref 0.3–1.2)
Total Protein: 6.3 g/dL — ABNORMAL LOW (ref 6.5–8.1)

## 2021-01-04 LAB — PROTIME-INR
INR: 1.2 (ref 0.8–1.2)
Prothrombin Time: 14.3 seconds (ref 11.4–15.2)

## 2021-01-04 LAB — MAGNESIUM: Magnesium: 2 mg/dL (ref 1.7–2.4)

## 2021-01-04 LAB — APTT: aPTT: 32 seconds (ref 24–36)

## 2021-01-04 LAB — SARS CORONAVIRUS 2 (TAT 6-24 HRS): SARS Coronavirus 2: NEGATIVE

## 2021-01-04 LAB — PHOSPHORUS: Phosphorus: 3.4 mg/dL (ref 2.5–4.6)

## 2021-01-04 MED ORDER — TAMSULOSIN HCL 0.4 MG PO CAPS: 0.4000 mg | ORAL_CAPSULE | Freq: Every day | ORAL | Status: DC

## 2021-01-04 MED ORDER — FINASTERIDE 5 MG PO TABS: 5.0000 mg | ORAL_TABLET | Freq: Every day | ORAL | Status: DC

## 2021-01-04 MED ORDER — DOCUSATE SODIUM 100 MG PO CAPS: 200.0000 mg | ORAL_CAPSULE | Freq: Every day | ORAL | Status: DC

## 2021-01-04 MED ORDER — METOPROLOL TARTRATE 25 MG PO TABS: 25.0000 mg | ORAL_TABLET | Freq: Two times a day (BID) | ORAL | Status: DC

## 2021-01-04 MED ORDER — AMIODARONE HCL 200 MG PO TABS: 200.0000 mg | ORAL_TABLET | Freq: Every day | ORAL | Status: DC

## 2021-01-04 MED ORDER — ALPRAZOLAM 0.5 MG PO TABS: 1.0000 mg | ORAL_TABLET | Freq: Every day | ORAL | Status: DC

## 2021-01-04 MED ORDER — ATORVASTATIN CALCIUM 40 MG PO TABS: 80.0000 mg | ORAL_TABLET | Freq: Every day | ORAL | Status: DC

## 2021-01-04 MED ORDER — ASPIRIN EC 81 MG PO TBEC: 81.0000 mg | DELAYED_RELEASE_TABLET | Freq: Every day | ORAL | Status: DC

## 2021-01-04 MED ORDER — NITROGLYCERIN 0.4 MG SL SUBL: 0.4000 mg | SUBLINGUAL_TABLET | SUBLINGUAL | Status: DC | PRN

## 2021-01-04 MED ORDER — ISOSORBIDE MONONITRATE ER 60 MG PO TB24: 60.0000 mg | ORAL_TABLET | Freq: Every day | ORAL | Status: DC

## 2021-01-04 MED ORDER — PANTOPRAZOLE SODIUM 40 MG PO TBEC: 40.0000 mg | DELAYED_RELEASE_TABLET | Freq: Every day | ORAL | Status: DC

## 2021-01-04 NOTE — Consult Note (Signed)
GI consult ordered for patient. Prior to me seeing patient, patient left AMA.   Will let Dr. Olevia Perches office know of recent ED evaluation so outpatient follow up can be arranged as appropriate.   Laureen Ochs. Bernarda Caffey [facility] [phone] 1/19/20229:03 AM

## 2021-01-04 NOTE — Telephone Encounter (Signed)
Patient was last seen in the office on 08/23/20 for Lorre Nick Induced Constipation,Tiredness. Refer to Neil Crouch PA note.

## 2021-01-04 NOTE — Telephone Encounter (Signed)
Inpatient GI consult ordered for patient. Prior to me seeing patient, patient left AMA.   He was in hospital with rectal bleeding, consider offering outpatient follow up OV.

## 2021-01-04 NOTE — Discharge Summary (Signed)
Patient was admitted last night with rectal bleeding.  I received a message from the nurse this morning around 8:30 AM that patient wants to be discharged.  I had explained to the nurse that my team is splitted between [place] [facility] and [facility] today and that I was rounding on my patients at [facility] at the moment.  I had informed primary RN that I will arrive at this campus in 2 to 3 hours, no later than noon.  Patient however did not want to wait until then and he decided to leave [facility] at 9 AM.

## 2021-01-04 NOTE — ED Notes (Signed)
Patient resting in bed with eyes closed.  0 s/s acute distress.  Call bell in reach. °

## 2021-01-04 NOTE — ED Notes (Signed)
This nurse questioned pt regarding rectal bleeding.  Pt denied rectal bleeding stating he is here for constipation and is fine now.  He had bleeding "up there" referring to home and is not having bleeding at this time.  Pt would like to leave.  Dr. Doristine Bosworth notified and is unable to see this patient until noon.  Pt wanting to leave AMA, provider made aware.

## 2021-01-05 ENCOUNTER — Ambulatory Visit: Payer: Medicare Other | Admitting: Urology

## 2021-01-05 DIAGNOSIS — N138 Other obstructive and reflux uropathy: Secondary | ICD-10-CM

## 2021-01-09 DIAGNOSIS — I4891 Unspecified atrial fibrillation: Secondary | ICD-10-CM | POA: Diagnosis not present

## 2021-01-09 DIAGNOSIS — E782 Mixed hyperlipidemia: Secondary | ICD-10-CM | POA: Diagnosis not present

## 2021-01-09 DIAGNOSIS — S61411D Laceration without foreign body of right hand, subsequent encounter: Secondary | ICD-10-CM | POA: Diagnosis not present

## 2021-01-09 DIAGNOSIS — D519 Vitamin B12 deficiency anemia, unspecified: Secondary | ICD-10-CM | POA: Diagnosis not present

## 2021-01-09 DIAGNOSIS — K219 Gastro-esophageal reflux disease without esophagitis: Secondary | ICD-10-CM | POA: Diagnosis not present

## 2021-01-09 DIAGNOSIS — E559 Vitamin D deficiency, unspecified: Secondary | ICD-10-CM | POA: Diagnosis not present

## 2021-01-09 DIAGNOSIS — R7301 Impaired fasting glucose: Secondary | ICD-10-CM | POA: Diagnosis not present

## 2021-01-12 DIAGNOSIS — K8080 Other cholelithiasis without obstruction: Secondary | ICD-10-CM | POA: Diagnosis not present

## 2021-01-12 DIAGNOSIS — E782 Mixed hyperlipidemia: Secondary | ICD-10-CM | POA: Diagnosis not present

## 2021-01-12 DIAGNOSIS — R7301 Impaired fasting glucose: Secondary | ICD-10-CM | POA: Diagnosis not present

## 2021-01-12 DIAGNOSIS — F5101 Primary insomnia: Secondary | ICD-10-CM | POA: Diagnosis not present

## 2021-01-12 DIAGNOSIS — M5080 Other cervical disc disorders, unspecified cervical region: Secondary | ICD-10-CM | POA: Diagnosis not present

## 2021-01-12 DIAGNOSIS — G4733 Obstructive sleep apnea (adult) (pediatric): Secondary | ICD-10-CM | POA: Diagnosis not present

## 2021-01-12 DIAGNOSIS — I25119 Atherosclerotic heart disease of native coronary artery with unspecified angina pectoris: Secondary | ICD-10-CM | POA: Diagnosis not present

## 2021-01-12 DIAGNOSIS — M5116 Intervertebral disc disorders with radiculopathy, lumbar region: Secondary | ICD-10-CM | POA: Diagnosis not present

## 2021-01-12 DIAGNOSIS — I480 Paroxysmal atrial fibrillation: Secondary | ICD-10-CM | POA: Diagnosis not present

## 2021-01-12 DIAGNOSIS — I10 Essential (primary) hypertension: Secondary | ICD-10-CM | POA: Diagnosis not present

## 2021-01-12 DIAGNOSIS — K219 Gastro-esophageal reflux disease without esophagitis: Secondary | ICD-10-CM | POA: Diagnosis not present

## 2021-01-26 ENCOUNTER — Ambulatory Visit: Payer: Medicare Other | Admitting: Urology

## 2021-02-01 DIAGNOSIS — G4733 Obstructive sleep apnea (adult) (pediatric): Secondary | ICD-10-CM | POA: Diagnosis not present

## 2021-02-06 ENCOUNTER — Ambulatory Visit (INDEPENDENT_AMBULATORY_CARE_PROVIDER_SITE_OTHER): Payer: Medicare Other | Admitting: Urology

## 2021-02-06 ENCOUNTER — Other Ambulatory Visit: Payer: Self-pay

## 2021-02-06 ENCOUNTER — Encounter: Payer: Self-pay | Admitting: Urology

## 2021-02-06 VITALS — BP 105/63 | HR 66 | Temp 97.9°F

## 2021-02-06 DIAGNOSIS — R3911 Hesitancy of micturition: Secondary | ICD-10-CM

## 2021-02-06 DIAGNOSIS — N138 Other obstructive and reflux uropathy: Secondary | ICD-10-CM

## 2021-02-06 DIAGNOSIS — N401 Enlarged prostate with lower urinary tract symptoms: Secondary | ICD-10-CM

## 2021-02-06 LAB — URINALYSIS, ROUTINE W REFLEX MICROSCOPIC
Bilirubin, UA: NEGATIVE
Glucose, UA: NEGATIVE
Ketones, UA: NEGATIVE
Leukocytes,UA: NEGATIVE
Nitrite, UA: NEGATIVE
Protein,UA: NEGATIVE
RBC, UA: NEGATIVE
Specific Gravity, UA: 1.025 (ref 1.005–1.030)
Urobilinogen, Ur: 0.2 mg/dL (ref 0.2–1.0)
pH, UA: 5.5 (ref 5.0–7.5)

## 2021-02-06 NOTE — Progress Notes (Signed)
[facility] [place]  02/06/2021 10:06 AM   Christopher [name] [birth year] [id number]  Referring provider: Celene Squibb, MD [address] [name],  [place] [address]  No chief complaint on file.   HPI:  F/u - BPH with LUTS, nocturia. He has stable LUTS on alfuzosin (2021) and and finasteride (since 2019). Tried tamsulosin 2019-2020, rapaflo 2020 - 2021. Prostate about 43 g on CT in 2018 prior to finasteride use. He has nocturia x 0-1. AUASS = 17. Feeling of inc emptying and slow stream. He has an intermittent weak stream with occasional dribbling. No hematuria or dysuria. No urinary frequency or urgency. He wishes to pursue Urolift (the prostate "clamping"). He underwent CABG 09/2019 and remains on eliquis for a fib.  His June 2020 PSA was 0.68 ([facility]).   Occasional urine odor. No dysuria.      PMH: Past Medical History:  Diagnosis Date  . A-fib ([place])   . Arthritis    "all over my body" (06/17/2017)  . BPH (benign prostatic hyperplasia)    takes Proscar daily  . Chronic back pain    DDD; "upper and lower" (06/17/2017)  . Constipation    takes Colace daily  . DDD (degenerative disc disease), cervical   . DVT (deep venous thrombosis) ([facility])    left peroneal vein 07/2017 (in setting of recent left TKA)  . Dysrhythmia    history of Atrial fibrillation, no long has this  . Enlarged prostate   . GERD (gastroesophageal reflux disease)    takes Nexium daily  . H/O hiatal hernia   . History of colon polyps    benign  . History of gastric ulcer   . Insomnia    takes Xanax nightly  . Joint capsule tear    left knee  . Joint pain   . Joint swelling   . Macular degeneration   . Muscle spasm of back    takes Valium daily as needed  . PAF (paroxysmal atrial fibrillation) ([facility])   . Peripheral neuropathy    takes Gabapentin daily  . PONV (postoperative nausea and vomiting)   . Sleep apnea    pt does not wear a cpap  . Urinary hesitancy   . Weakness    numbness and tingling     Surgical History: Past Surgical History:  Procedure Laterality Date  . ANTERIOR CERVICAL DECOMP/DISCECTOMY FUSION  ~ 1991   "took bone from hip"  . ANTERIOR CERVICAL DECOMP/DISCECTOMY FUSION  X 2   "put plate in"  . ANTERIOR CERVICAL DISCECTOMY  <11/2005    5-6 and 4-5./notes 05/02/2011  . APPENDECTOMY    . BACK SURGERY    . BIOPSY  05/30/2018   Procedure: BIOPSY;  Surgeon: [name], MD;  [facility];  Service: Endoscopy;;  esophagus  . CARDIAC CATHETERIZATION    . CARPAL TUNNEL RELEASE Bilateral 10/2005 - 11/2005   right-left [facility] 05/02/2011  . CATARACT EXTRACTION W/PHACO Right 05/24/2014   Procedure: CATARACT EXTRACTION PHACO AND INTRAOCULAR LENS PLACEMENT (IOC);  Surgeon: [name], MD;  Location: [facility];  Service: Ophthalmology;  Laterality: Right;  CDE 6.74  . CATARACT EXTRACTION W/PHACO Left 06/17/2014   Procedure: CATARACT EXTRACTION PHACO AND INTRAOCULAR LENS PLACEMENT (IOC);  Surgeon: [name], MD;  Location: [facility];  Service: Ophthalmology;  Laterality: Left;  CDE:4.65  . COLONOSCOPY WITH ESOPHAGOGASTRODUODENOSCOPY (EGD) N/A 01/22/2013   Procedure: COLONOSCOPY WITH ESOPHAGOGASTRODUODENOSCOPY (EGD);  Surgeon: [name], MD;  [facility];  Service:  Endoscopy;  Laterality: N/A;  140  . COLONOSCOPY WITH PROPOFOL N/A 05/30/2018   Procedure: COLONOSCOPY WITH PROPOFOL;  Surgeon: [name], MD;  [facility];  Service: Endoscopy;  Laterality: N/A;  . ESOPHAGOGASTRODUODENOSCOPY (EGD) WITH PROPOFOL N/A 05/30/2018   Procedure: ESOPHAGOGASTRODUODENOSCOPY (EGD) WITH PROPOFOL;  Surgeon: [name], MD;  [facility];  Service: Endoscopy;  Laterality: N/A;  . EYE SURGERY Bilateral    "cleaned film w/laser; since my 1st cataract OR"  . JOINT REPLACEMENT    . LEFT HEART CATH AND CORONARY ANGIOGRAPHY N/A 02/16/2019   Procedure: LEFT HEART CATH AND CORONARY ANGIOGRAPHY;  Surgeon: Belva Crome, MD;  Location: [facility];  Service: Cardiovascular;  Laterality: N/A;  . POSTERIOR LUMBAR FUSION     "put a plate in"  . QUADRICEPS TENDON REPAIR Left 07/01/2017  . QUADRICEPS TENDON REPAIR Left 07/01/2017   Procedure: REPAIR QUADRICEP TENDON;  Surgeon: [name], MD;  Location: [facility];  Service: Orthopedics;  Laterality: Left;  . SHOULDER ARTHROSCOPY Bilateral   . SPINAL CORD STIMULATOR INSERTION N/A 09/23/2015   Procedure: LUMBAR SPINAL CORD STIMULATOR INSERTION;  Surgeon: [name], MD;  Location: [facility];  Service: Neurosurgery;  Laterality: N/A;  LUMBAR SPINAL CORD STIMULATOR INSERTION  . TOTAL KNEE ARTHROPLASTY Left 06/17/2017  . TOTAL KNEE ARTHROPLASTY Left 06/17/2017   Procedure: TOTAL KNEE ARTHROPLASTY;  Surgeon: [name], MD;  Location: [facility];  Service: Orthopedics;  Laterality: Left;  . TOTAL KNEE ARTHROPLASTY Right 12/16/2017   Procedure: TOTAL KNEE ARTHROPLASTY;  Surgeon: [name], MD;  Location: [facility];  Service: Orthopedics;  Laterality: Right;    Home Medications:  Allergies as of 02/06/2021      Reactions   Morphine Nausea And Vomiting   Tylenol [acetaminophen]    Stomach pain    Morphine And Related Nausea And Vomiting   Propofol Nausea And Vomiting      Medication List       Accurate as of February 06, 2021 10:06 AM. If you have any questions, ask your nurse or doctor.        alfuzosin 10 MG 24 hr tablet Commonly known as: UROXATRAL Take 1 tablet (10 mg total) by mouth daily with breakfast.   ALPRAZolam 1 MG tablet Commonly known as: XANAX Take 1 mg by mouth at bedtime.   amiodarone 200 MG tablet Commonly known as: PACERONE Take 200 mg by mouth daily.   apixaban 5 MG Tabs tablet Commonly known as: ELIQUIS Take 1 tablet (5 mg total) by mouth 2 (two) times daily.   aspirin EC 81 MG tablet Take 1 tablet (81 mg total) by mouth daily.   atorvastatin 80 MG tablet Commonly known as: LIPITOR TAKE 1 TABLET BY MOUTH EVERY DAY   B-12 PO Take by mouth daily.    docusate sodium 250 MG capsule Commonly known as: COLACE Take 250 mg by mouth daily.   FIBER-CAPS PO Take 2 capsules by mouth daily.   finasteride 5 MG tablet Commonly known as: PROSCAR Take 1 tablet (5 mg total) by mouth daily.   furosemide 20 MG tablet Commonly known as: LASIX Take 1 tablet (20 mg total) by mouth daily.   gabapentin 400 MG capsule Commonly known as: NEURONTIN One three times a day What changed:   how much to take  how to take this  when to take this   HYDROcodone-acetaminophen 10-325 MG tablet Commonly known as: NORCO Take 1 tablet by mouth every  8 (eight) hours as needed for moderate pain.   isosorbide mononitrate 60 MG 24 hr tablet Commonly known as: IMDUR Take 60 mg by mouth daily.   metoprolol tartrate 25 MG tablet Commonly known as: LOPRESSOR Take 25 mg by mouth 2 (two) times daily.   nitroGLYCERIN 0.4 MG SL tablet Commonly known as: NITROSTAT Place 1 tablet (0.4 mg total) under the tongue every 5 (five) minutes as needed for chest pain.   Omega-3 1000 MG Caps Take by mouth.   pantoprazole 40 MG tablet Commonly known as: PROTONIX TAKE 1 TABLET BY MOUTH TWICE A DAY (30 TO 60 MINUTES BEFORE 1ST AND LAST MEALS OF THE DAY)   polyethylene glycol powder 17 GM/SCOOP powder Commonly known as: GLYCOLAX/MIRALAX Take 8.5 g by mouth daily.   potassium chloride 10 MEQ tablet Commonly known as: KLOR-CON Take by mouth.   silodosin 8 MG Caps capsule Commonly known as: RAPAFLO Take 8 mg by mouth daily with breakfast.   Simethicone 180 MG Caps Take 1 capsule (180 mg total) by mouth 3 (three) times daily as needed.   tamsulosin 0.4 MG Caps capsule Commonly known as: FLOMAX Take 0.4 mg by mouth at bedtime.   Vitamin D3 10 MCG (400 UNIT) Caps Take 400 Units by mouth daily.   vitamin E 200 UNIT capsule Take 200 Units by mouth daily.       Allergies:  Allergies  Allergen Reactions  . Morphine Nausea And Vomiting  . Tylenol  [Acetaminophen]     Stomach pain   . Morphine And Related Nausea And Vomiting  . Propofol Nausea And Vomiting    Family History: No family history on file.  Social History:  reports that he quit smoking about 34 years ago. His smoking use included cigarettes. He has a 30.00 pack-year smoking history. He has quit using smokeless tobacco.  His smokeless tobacco use included chew. He reports current alcohol use of about 15.0 standard drinks of alcohol per week. He reports that he does not use drugs.   Physical Exam: There were no vitals taken for this visit.  Constitutional:  Alert and oriented, No acute distress. HEENT: [place] AT, moist mucus membranes.  Trachea midline, no masses. Cardiovascular: No clubbing, cyanosis, or edema. Respiratory: Normal respiratory effort, no increased work of breathing. GI: Abdomen is soft, nontender, nondistended, no abdominal masses GU: No CVA tenderness Lymph: No cervical or inguinal lymphadenopathy. Skin: No rashes, bruises or suspicious lesions. Neurologic: Grossly intact, no focal deficits, moving all 4 extremities. Psychiatric: Normal mood and affect. DRE; prostate 30 g and smooth. No hard area or nodule.   Laboratory Data: Lab Results  Component Value Date   WBC 14.0 (H) 01/04/2021   HGB 12.1 (L) 01/04/2021   HCT 37.0 (L) 01/04/2021   MCV 94.1 01/04/2021   PLT 235 01/04/2021    Lab Results  Component Value Date   CREATININE 0.95 01/04/2021    No results found for: PSA  No results found for: TESTOSTERONE  Lab Results  Component Value Date   HGBA1C 5.6 12/31/2020    Urinalysis    Component Value Date/Time   COLORURINE YELLOW 03/07/2014 2103   APPEARANCEUR CLEAR 03/07/2014 2103   LABSPEC 1.014 03/07/2014 2103   PHURINE 7.5 03/07/2014 2103   GLUCOSEU NEGATIVE 03/07/2014 2103   HGBUR NEGATIVE 03/07/2014 2103   BILIRUBINUR NEGATIVE 03/07/2014 2103   [place] NEGATIVE 03/07/2014 2103   PROTEINUR NEGATIVE 03/07/2014 2103    UROBILINOGEN 0.2 03/07/2014 2103   NITRITE NEGATIVE 03/07/2014 2103  LEUKOCYTESUR NEGATIVE 03/07/2014 2103    No results found for: LABMICR, [place], RBCUA, LABEPIT, MUCUS, BACTERIA  Pertinent Imaging: CT 2018  No results found for this or any previous visit.  No results found for this or any previous visit.  No results found for this or any previous visit.  No results found for this or any previous visit.  No results found for this or any previous visit.  No results found for this or any previous visit.  No results found for this or any previous visit.  Results for orders placed during the hospital encounter of 02/21/17  CT RENAL STONE STUDY  Narrative CLINICAL DATA:  Right flank pain for 5 days, history of appendectomy  EXAM: CT ABDOMEN AND PELVIS WITHOUT CONTRAST  TECHNIQUE: Multidetector CT imaging of the abdomen and pelvis was performed following the standard protocol without IV contrast.  COMPARISON:  None.  FINDINGS: Lower chest: Lung bases shows no acute findings. Partially visualized lower thoracic spinal canal stimulator wires  Hepatobiliary: Unenhanced liver shows no biliary ductal dilatation. Question tiny noncalcified gallstones or sludge within dependent gallbladder the largest measures 3 mm.  Pancreas: Unenhanced pancreas with normal appearance.  Spleen: Unenhanced spleen with normal appearance.  Adrenals/Urinary Tract: No adrenal gland mass. Unenhanced kidneys are symmetrical in size. No hydronephrosis or hydroureter. No nephrolithiasis. No calcified ureteral calculi. No calcified calculi are noted within urinary bladder. Mild thickening of urinary bladder wall. Mild cystitis or chronic inflammation cannot be excluded.  Stomach/Bowel: There is no small bowel obstruction. The study is limited without oral or IV contrast. There is some gas in distal small bowel loops without thickening of small bowel wall. Minimal ileus cannot be excluded.  There is no small bowel obstruction. No transition point in caliber of small bowel. No pericecal inflammation. The terminal ileum is unremarkable. The patient is status post appendectomy. Some colonic stool and gas noted within right colon. There is some colonic stool and gas within descending colon and sigmoid colon. No distal colonic obstruction. No definite evidence of colitis or diverticulitis.  Vascular/Lymphatic: Atherosclerotic calcifications of abdominal aorta and iliac arteries. No adenopathy is noted. No aortic aneurysm.  Reproductive: Mild enlarged prostate gland with indentation of urinary bladder base. Prostate gland measures 3.8 by 4.3 cm.  Other: There is a left inguinal scrotal canal hernia containing fat measures 1.6 cm without evidence of acute complication. Small nonspecific bilateral inguinal lymph nodes are noted. The largest left inguinal lymph node measures 6 mm short-axis. The largest right inguinal lymph node measures 7.5 mm short-axis. These are not pathologic by size criteria.  Musculoskeletal: No destructive bony lesions are noted. Sagittal images of the spine shows degenerative changes thoracolumbar spine. Posterior fusion noted lower lumbar spine at L4-L5 level with anatomic alignment.  IMPRESSION: 1. No nephrolithiasis. No hydronephrosis or hydroureter. Question tiny layering sludge or noncalcified gallstones within gallbladder the largest measures 3 mm. 2. No calcified calculi are noted within urinary bladder. Mild thickening of urinary bladder wall. Mild cystitis or chronic inflammation cannot be excluded. Clinical correlation is necessary. 3. Some gas noted within distal small bowel loops without significant small bowel distension. No transition point in caliber of small bowel. Mild ileus cannot be excluded. No definite evidence of small bowel obstruction. 4. No pericecal inflammation. The patient is status post appendectomy. Unremarkable  terminal ileum. Moderate stool noted in cecum and proximal right colon. 5. No calcified ureteral calculi are noted. 6. Mild enlarged prostate gland with indentation of the bladder base. 7. Small  nonspecific bilateral inguinal lymph nodes are noted. There is a small left inguinal scrotal canal hernia containing fat measures 1.6 cm no evidence of acute complication.   Electronically Signed By: Lahoma Crocker M.D. On: 02/22/2017 09:02   Assessment & Plan:    1. Benign prostatic hyperplasia with urinary obstruction - we went over the nature r/b of meds (alpha blocker, 5ari), OAB meds and procedures. We discussed PUL and typical improvement in flow symptoms and irritative symptoms but some OAB symptoms may persist or worsen as well as risk of incontinence, bleeding and stricture among others.   He wants to give it some thought and he'll return for a PVR and cystoscopy.   - Urinalysis, Routine w reflex microscopic   No follow-ups on file.  Festus Aloe, MD

## 2021-02-06 NOTE — Progress Notes (Signed)
Urological Symptom Review  Patient is experiencing the following symptoms: Hard to postpone urination Leakage of urine Stream starts and stops Trouble starting stream Painful intercourse Weak stream Erection problems (male only) Penile pain (male only)    Review of Systems  Gastrointestinal (upper)  : Indigestion/heartburn  Gastrointestinal (lower) : Negative for lower GI symptoms  Constitutional : Negative for symptoms  Skin: Itching  Eyes: Negative for eye symptoms  Ear/Nose/Throat : Negative for Ear/Nose/Throat symptoms  Hematologic/Lymphatic: Easy bruising  Cardiovascular : Negative for cardiovascular symptoms  Respiratory : Negative for respiratory symptoms  Endocrine: Negative for endocrine symptoms  Musculoskeletal: Back pain Joint pain  Neurological: Negative for neurological symptoms  Psychologic: Negative for psychiatric symptoms

## 2021-02-06 NOTE — Patient Instructions (Signed)
Prostatic Urethral Lift  Prostatic urethral lift is a surgical procedure to relieve symptoms of prostate gland enlargement that occurs with age (benign prostatic hypertrophy, BPH). The part of the body that drains urine from the bladder (urethra) passes between the two lobes of the prostate. As the prostate enlarges, it can push on the urethra and cause problems with urinating. This procedure involves placing an implant that holds the prostate away from the urethra. The procedure is performed with a thin operating telescope (cystoscope) that is inserted through the tip of the penis and moved up the urethra to the prostate. This is less invasive than other procedures that require an incision. You may have this procedure if:  You have symptoms of BPH.  Your prostate is not severely enlarged.  Medicines to treat BPH are not working or not tolerated.  You want to avoid possible sexual side effects from medicines or other procedures that are used to treat BPH. Tell a health care provider about:  Any allergies you have.  All medicines you are taking, including vitamins, herbs, eye drops, creams, and over-the-counter medicines.  Previous problems you or members of your family have had with the use of anesthetics.  Any blood disorders you have.  Previous surgeries you have had.  Any medical conditions you have. What are the risks?  Bleeding.  Infection.  Leaking of urine (incontinence).  Allergic reactions to medicines.  Return of BPH symptoms after 2 years, requiring more treatment. What happens before the procedure?  Ask your health care provider about: ? Changing or stopping your regular medicines. This is especially important if you are taking diabetes medicines or blood thinners. ? Taking medicines such as aspirin and ibuprofen. These medicines can thin your blood. Do not take these medicines before your procedure if your health care provider instructs you not to.  Follow  instructions from your health care provider about eating or drinking restrictions.  Plan to have someone take you home from the hospital or clinic. What happens during the procedure?  To lower your risk of infection: ? Your health care team will wash or sanitize their hands. ? Your skin will be washed with soap.  An IV may be inserted into one of your veins.  You will be given one or more of the following: ? A medicine to help you relax (sedative). ? A medicine that is injected into your urethra to numb the area (local anesthetic). ? A medicine to make you fall asleep (general anesthetic).  A cystoscope will be inserted into your penis and moved through your urethra to your prostate.  A device will be inserted through the cystoscope and used to press the lobes of your prostate away from your urethra.  Implants will be inserted through the device to hold the lobes of your prostate in the widened position.  The device and cystoscope will be removed. The procedure may vary among health care providers and hospitals. What happens after the procedure?  Your blood pressure, heart rate, breathing rate, and blood oxygen level will be monitored until the medicines you were given have worn off.  Do not drive for 24 hours if you were given a sedative. Summary  Prostatic urethral lift is a surgical procedure to relieve symptoms of prostate gland enlargement that occurs with age (benign prostatic hypertrophy, BPH).  The procedure is performed with a thin operating telescope (cystoscope) that is inserted through the tip of the penis and moved up the part of the body that drains   urine from the bladder (urethra) to reach the prostate. This is less invasive than other procedures that require an incision.  Plan to have someone take you home from the hospital or clinic. You will not be allowed to drive for 24 hours if you were given a sedative during the procedure. This information is not intended to  replace advice given to you by your health care provider. Make sure you discuss any questions you have with your health care provider. Document Revised: 08/11/2020 Document Reviewed: 08/11/2020 Elsevier Patient Education  2021 [place]. Cystoscopy Cystoscopy is a procedure that is used to help diagnose and sometimes treat conditions that affect the lower urinary tract. The lower urinary tract includes the bladder and the urethra. The urethra is the tube that drains urine from the bladder. Cystoscopy is done using a thin, tube-shaped instrument with a light and camera at the end (cystoscope). The cystoscope may be hard or flexible, depending on the goal of the procedure. The cystoscope is inserted through the urethra, into the bladder. Cystoscopy may be recommended if you have:  Urinary tract infections that keep coming back.  Blood in the urine (hematuria).  An inability to control when you urinate (urinary incontinence) or an overactive bladder.  Unusual cells found in a urine sample.  A blockage in the urethra, such as a urinary stone.  Painful urination.  An abnormality in the bladder found during an intravenous pyelogram (IVP) or CT scan. Cystoscopy may also be done to remove a sample of tissue to be examined under a microscope (biopsy). Tell a health care provider about:  Any allergies you have.  All medicines you are taking, including vitamins, herbs, eye drops, creams, and over-the-counter medicines.  Any problems you or family members have had with anesthetic medicines.  Any blood disorders you have.  Any surgeries you have had.  Any medical conditions you have.  Whether you are pregnant or may be pregnant. What are the risks? Generally, this is a safe procedure. However, problems may occur, including:  Infection.  Bleeding.  Allergic reactions to medicines.  Damage to other structures or organs. What happens before the procedure? Medicines Ask your health  care provider about:  Changing or stopping your regular medicines. This is especially important if you are taking diabetes medicines or blood thinners.  Taking medicines such as aspirin and ibuprofen. These medicines can thin your blood. Do not take these medicines unless your health care provider tells you to take them.  Taking over-the-counter medicines, vitamins, herbs, and supplements. Tests You may have an exam or testing, such as:  X-rays of the bladder, urethra, or kidneys.  CT scan of the abdomen or pelvis.  Urine tests to check for signs of infection. General instructions  Follow instructions from your health care provider about eating or drinking restrictions.  Ask your health care provider what steps will be taken to help prevent infection. These steps may include: ? Washing skin with a germ-killing soap. ? Taking antibiotic medicine.  Plan to have a responsible adult take you home from the hospital or clinic. What happens during the procedure?  You will be given one or more of the following: ? A medicine to help you relax (sedative). ? A medicine to numb the area (local anesthetic).  The area around the opening of your urethra will be cleaned.  The cystoscope will be passed through your urethra into your bladder.  Germ-free (sterile) fluid will flow through the cystoscope to fill your bladder. The  fluid will stretch your bladder so that your health care provider can clearly examine your bladder walls.  Your doctor will look at the urethra and bladder. Your doctor may take a biopsy or remove stones.  The cystoscope will be removed, and your bladder will be emptied. The procedure may vary among health care providers and hospitals.   What can I expect after the procedure? After the procedure, it is common to have:  Some soreness or pain in your abdomen and urethra.  Urinary symptoms. These include: ? Mild pain or burning when you urinate. Pain should stop within  a few minutes after you urinate. This may last for up to 1 week. ? A small amount of blood in your urine for several days. ? Feeling like you need to urinate but producing only a small amount of urine. Follow these instructions at home: Medicines  Take over-the-counter and prescription medicines only as told by your health care provider.  If you were prescribed an antibiotic medicine, take it as told by your health care provider. Do not stop taking the antibiotic even if you start to feel better. General instructions  Return to your normal activities as told by your health care provider. Ask your health care provider what activities are safe for you.  If you were given a sedative during the procedure, it can affect you for several hours. Do not drive or operate machinery until your health care provider says that it is safe.  Watch for any blood in your urine. If the amount of blood in your urine increases, call your health care provider.  Follow instructions from your health care provider about eating or drinking restrictions.  If a tissue sample was removed for testing (biopsy) during your procedure, it is up to you to get your test results. Ask your health care provider, or the department that is doing the test, when your results will be ready.  Drink enough fluid to keep your urine pale yellow.  Keep all follow-up visits. This is important. Contact a health care provider if:  You have pain that gets worse or does not get better with medicine, especially pain when you urinate.  You have trouble urinating.  You have more blood in your urine. Get help right away if:  You have blood clots in your urine.  You have abdominal pain.  You have a fever or chills.  You are unable to urinate. Summary  Cystoscopy is a procedure that is used to help diagnose and sometimes treat conditions that affect the lower urinary tract.  Cystoscopy is done using a thin, tube-shaped instrument  with a light and camera at the end.  After the procedure, it is common to have some soreness or pain in your abdomen and urethra.  Watch for any blood in your urine. If the amount of blood in your urine increases, call your health care provider.  If you were prescribed an antibiotic medicine, take it as told by your health care provider. Do not stop taking the antibiotic even if you start to feel better. This information is not intended to replace advice given to you by your health care provider. Make sure you discuss any questions you have with your health care provider. Document Revised: 07/15/2020 Document Reviewed: 07/15/2020 Elsevier Patient Education  [place].

## 2021-02-07 LAB — PSA: Prostate Specific Ag, Serum: 0.7 ng/mL (ref 0.0–4.0)

## 2021-02-09 ENCOUNTER — Other Ambulatory Visit: Payer: Self-pay | Admitting: Neurosurgery

## 2021-02-09 ENCOUNTER — Telehealth: Payer: Self-pay

## 2021-02-09 DIAGNOSIS — M961 Postlaminectomy syndrome, not elsewhere classified: Secondary | ICD-10-CM

## 2021-02-09 DIAGNOSIS — M546 Pain in thoracic spine: Secondary | ICD-10-CM | POA: Diagnosis not present

## 2021-02-09 DIAGNOSIS — M542 Cervicalgia: Secondary | ICD-10-CM | POA: Diagnosis not present

## 2021-02-09 DIAGNOSIS — Z9689 Presence of other specified functional implants: Secondary | ICD-10-CM | POA: Diagnosis not present

## 2021-02-09 NOTE — Telephone Encounter (Signed)
Phone call to patient to verify medication list and allergies for myelogram procedure. Medications pt is currently taking are safe to continue to take. Advised pt if any new medications are started prior to procedure to call and make [place] aware. Pt also instructed to have a driver the day of the procedure, he would be with [place] around 2 hours and discharge instructions. Pt verbalized understanding.  Pt also reports he has a Spinal cord stimulator. Instructed pt to bring the remote the day of his myelogram procedure to turn it off prior to his CT scan. Unless, the pt knows if it is safe for CT. Pt can not recall so he will bring the remote the day of.

## 2021-02-13 ENCOUNTER — Encounter (INDEPENDENT_AMBULATORY_CARE_PROVIDER_SITE_OTHER): Payer: Self-pay | Admitting: Gastroenterology

## 2021-02-13 ENCOUNTER — Other Ambulatory Visit: Payer: Self-pay

## 2021-02-13 ENCOUNTER — Ambulatory Visit (INDEPENDENT_AMBULATORY_CARE_PROVIDER_SITE_OTHER): Payer: Medicare Other | Admitting: Gastroenterology

## 2021-02-13 VITALS — BP 116/74 | HR 56 | Temp 97.6°F | Ht 68.0 in | Wt 168.0 lb

## 2021-02-13 DIAGNOSIS — I10 Essential (primary) hypertension: Secondary | ICD-10-CM | POA: Diagnosis not present

## 2021-02-13 DIAGNOSIS — I25119 Atherosclerotic heart disease of native coronary artery with unspecified angina pectoris: Secondary | ICD-10-CM | POA: Diagnosis not present

## 2021-02-13 DIAGNOSIS — R945 Abnormal results of liver function studies: Secondary | ICD-10-CM | POA: Diagnosis not present

## 2021-02-13 DIAGNOSIS — R7989 Other specified abnormal findings of blood chemistry: Secondary | ICD-10-CM | POA: Insufficient documentation

## 2021-02-13 DIAGNOSIS — K5904 Chronic idiopathic constipation: Secondary | ICD-10-CM

## 2021-02-13 DIAGNOSIS — Z1159 Encounter for screening for other viral diseases: Secondary | ICD-10-CM | POA: Diagnosis not present

## 2021-02-13 DIAGNOSIS — G4733 Obstructive sleep apnea (adult) (pediatric): Secondary | ICD-10-CM | POA: Diagnosis not present

## 2021-02-13 DIAGNOSIS — M5080 Other cervical disc disorders, unspecified cervical region: Secondary | ICD-10-CM | POA: Diagnosis not present

## 2021-02-13 DIAGNOSIS — E782 Mixed hyperlipidemia: Secondary | ICD-10-CM | POA: Diagnosis not present

## 2021-02-13 DIAGNOSIS — I480 Paroxysmal atrial fibrillation: Secondary | ICD-10-CM | POA: Diagnosis not present

## 2021-02-13 DIAGNOSIS — K219 Gastro-esophageal reflux disease without esophagitis: Secondary | ICD-10-CM | POA: Diagnosis not present

## 2021-02-13 DIAGNOSIS — K8080 Other cholelithiasis without obstruction: Secondary | ICD-10-CM | POA: Diagnosis not present

## 2021-02-13 DIAGNOSIS — M5116 Intervertebral disc disorders with radiculopathy, lumbar region: Secondary | ICD-10-CM | POA: Diagnosis not present

## 2021-02-13 DIAGNOSIS — R7401 Elevation of levels of liver transaminase levels: Secondary | ICD-10-CM | POA: Diagnosis not present

## 2021-02-13 DIAGNOSIS — R7301 Impaired fasting glucose: Secondary | ICD-10-CM | POA: Diagnosis not present

## 2021-02-13 NOTE — Progress Notes (Signed)
Maylon Peppers, M.D. Gastroenterology & Hepatology [facility] [address] [place], [place] [address]  Primary Care Physician: Celene Squibb, MD [place] [id number]  I will communicate my assessment and recommendations to the referring MD via EMR.  Problems: 1. Constipation 2. Elevated LFTs 3. Rectal bleeding likely hemorrhoidal  History of Present Illness: Christopher [name] is a 75 y.o. male with past medical history of GAVE, GERD, constipation, afib on Eliquis, CAD s/p CABG, constipation, degenerative disc disease, DVT, who presents for follow up of constipation.  The patient was last seen on 08/23/2020. At that time, the patient was ordered an abdominal ultrasound for bloating episodes which showed cholelithiasis without any other acute changes. was also given instructions to take polyethylene glycol 1 scoop every day, etc. constipation.  Patient states that he is currently one scoop of Miralax every day but still has a BM every third day. He does not strain significantly but has to be in the restroom for at least 30 minutes until stool comes, usually with a soft consistency. He was previously taking the medication couple of times a week but he felt "very backed up" and had an episode of rectal bleeding.Patient also reports that he has presented abdominal distention in his abdomen due to constipation. Was also presenting significant nausea and vomited once. Due to this, patient decided to come to the ER at any point hospital. He left AMA so could not be seen by the inpatient GI service, but the patient reports that the bleeding has stopped since then. Was found to have hemoglobin of 12.1 with MCV of 94, CMP showed AST of 38 and ALT of 39. Notably, patient had repeat blood testing performed on 01/13/2021 which showed an AST of 43, ALT of 52, total bilirubin 0.4, alkaline phosphatase 95, electrolytes and renal function within  normal limits. Iron profile showed normal stores with iron saturation of 20% iron 82 and TIBC 294. Ferritin was 105.  Notably, patient has presented elevation of his liver function tests since January 2022.  Most recent TSH on 10/20/2020 was 2.13.  He reports chronic recurrent episodes of abdominal cramping for multiple years.  Patient reports he drinks 3-4 beers daily, which he has been doing for multuplie years. Not interested in quitting.  The patient denies having any nausea, vomiting, fever, chills,  melena, hematemesis,  abdominal pain, diarrhea, jaundice, pruritus or weight loss.  Last EGD: 05/30/2018, normal esophagus in the proximal and mid areas, with presence of possible short segment Barrett's esophagus up to 1 cm (biopsies consistent with reflux changes), GAVE Last Colonoscopy: 05/30/2018, diverticulosis and external hemorrhoids, recommend to have repeat colonoscopy in 7 years  Past Medical History: Past Medical History:  Diagnosis Date  . A-fib ([place])   . Arthritis    "all over my body" (06/17/2017)  . BPH (benign prostatic hyperplasia)    takes Proscar daily  . Chronic back pain    DDD; "upper and lower" (06/17/2017)  . Constipation    takes Colace daily  . DDD (degenerative disc disease), cervical   . DVT (deep venous thrombosis) (HCC)    left peroneal vein 07/2017 (in setting of recent left TKA)  . Dysrhythmia    history of Atrial fibrillation, no long has this  . Enlarged prostate   . GERD (gastroesophageal reflux disease)    takes Nexium daily  . H/O hiatal hernia   . History of colon polyps    benign  .  History of gastric ulcer   . Insomnia    takes Xanax nightly  . Joint capsule tear    left knee  . Joint pain   . Joint swelling   . Macular degeneration   . Muscle spasm of back    takes Valium daily as needed  . PAF (paroxysmal atrial fibrillation) ([place])   . Peripheral neuropathy    takes Gabapentin daily  . PONV (postoperative nausea and vomiting)   .  Sleep apnea    pt does not wear a cpap  . Urinary hesitancy   . Weakness    numbness and tingling    Past Surgical History: Past Surgical History:  Procedure Laterality Date  . ANTERIOR CERVICAL DECOMP/DISCECTOMY FUSION  ~ 1991   "took bone from hip"  . ANTERIOR CERVICAL DECOMP/DISCECTOMY FUSION  X 2   "put plate in"  . ANTERIOR CERVICAL DISCECTOMY  <11/2005    5-6 and 4-5./notes 05/02/2011  . APPENDECTOMY    . BACK SURGERY    . BIOPSY  05/30/2018   Procedure: BIOPSY;  Surgeon: [name], MD;  [facility];  Service: Endoscopy;;  esophagus  . CARDIAC CATHETERIZATION    . CARPAL TUNNEL RELEASE Bilateral 10/2005 - 11/2005   right-left [facility] Endo 05/02/2011  . CATARACT EXTRACTION W/PHACO Right 05/24/2014   Procedure: CATARACT EXTRACTION PHACO AND INTRAOCULAR LENS PLACEMENT (IOC);  Surgeon: [name], MD;  [facility];  Service: Ophthalmology;  Laterality: Right;  CDE 6.74  . CATARACT EXTRACTION W/PHACO Left 06/17/2014   Procedure: CATARACT EXTRACTION PHACO AND INTRAOCULAR LENS PLACEMENT (IOC);  Surgeon: [name], MD;  [facility]: [facility];  Service: Ophthalmology;  Laterality: Left;  CDE:4.65  . COLONOSCOPY WITH ESOPHAGOGASTRODUODENOSCOPY (EGD) N/A 01/22/2013   Procedure: COLONOSCOPY WITH ESOPHAGOGASTRODUODENOSCOPY (EGD);  Surgeon: [name], MD;  [facility];  Service: Endoscopy;  Laterality: N/A;  140  . COLONOSCOPY WITH PROPOFOL N/A 05/30/2018   Procedure: COLONOSCOPY WITH PROPOFOL;  Surgeon: [name], MD;  [facility];  Service: Endoscopy;  Laterality: N/A;  . ESOPHAGOGASTRODUODENOSCOPY (EGD) WITH PROPOFOL N/A 05/30/2018   Procedure: ESOPHAGOGASTRODUODENOSCOPY (EGD) WITH PROPOFOL;  Surgeon: [name], MD;  [facility];  Service: Endoscopy;  Laterality: N/A;  . EYE SURGERY Bilateral    "cleaned film w/laser; since my 1st cataract OR"  . JOINT REPLACEMENT    . LEFT HEART CATH AND CORONARY ANGIOGRAPHY N/A 02/16/2019    Procedure: LEFT HEART CATH AND CORONARY ANGIOGRAPHY;  Surgeon: Belva Crome, MD;  Location: [place] [facility];  Service: Cardiovascular;  Laterality: N/A;  . POSTERIOR LUMBAR FUSION     "put a plate in"  . QUADRICEPS TENDON REPAIR Left 07/01/2017  . QUADRICEPS TENDON REPAIR Left 07/01/2017   Procedure: REPAIR QUADRICEP TENDON;  Surgeon: [name], MD;  Location: [place];  Service: Orthopedics;  Laterality: Left;  . SHOULDER ARTHROSCOPY Bilateral   . SPINAL CORD STIMULATOR INSERTION N/A 09/23/2015   Procedure: LUMBAR SPINAL CORD STIMULATOR INSERTION;  Surgeon: [name], MD;  Location: [place] [facility];  Service: Neurosurgery;  Laterality: N/A;  LUMBAR SPINAL CORD STIMULATOR INSERTION  . TOTAL KNEE ARTHROPLASTY Left 06/17/2017  . TOTAL KNEE ARTHROPLASTY Left 06/17/2017   Procedure: TOTAL KNEE ARTHROPLASTY;  Surgeon: [name], MD;  Location: [place];  Service: Orthopedics;  Laterality: Left;  . TOTAL KNEE ARTHROPLASTY Right 12/16/2017   Procedure: TOTAL KNEE ARTHROPLASTY;  Surgeon: [name], MD;  Location: [place];  Service: Orthopedics;  Laterality:  Right;    Family History:History reviewed. No pertinent family history.  Social History: Social History   Tobacco Use  Smoking Status Former Smoker  . Packs/day: 1.00  . Years: 30.00  . Pack years: 30.00  . Types: Cigarettes  . Quit date: 01/14/1987  . Years since quitting: 34.1  Smokeless Tobacco Former Systems developer  . Types: Chew  Tobacco Comment   quit smoking  & chewing by 1987   Social History   Substance and Sexual Activity  Alcohol Use Yes  . Alcohol/week: 15.0 standard drinks  . Types: 3 Glasses of wine, 12 Cans of beer per week   Comment: 2-3 times weekly    Social History   Substance and Sexual Activity  Drug Use No    Allergies: Allergies  Allergen Reactions  . Morphine Nausea And Vomiting  . Other   . Tylenol [Acetaminophen]     Stomach pain   . Morphine And Related Nausea And Vomiting  . Propofol Nausea And  Vomiting    Medications: Current Outpatient Medications  Medication Sig Dispense Refill  . alfuzosin (UROXATRAL) 10 MG 24 hr tablet Take 1 tablet (10 mg total) by mouth daily with breakfast. 30 tablet 11  . ALPRAZolam (XANAX) 1 MG tablet Take 1 mg by mouth at bedtime.  0  . amiodarone (PACERONE) 200 MG tablet Take 200 mg by mouth daily.    [name] apixaban (ELIQUIS) 5 MG TABS tablet Take 1 tablet (5 mg total) by mouth 2 (two) times daily.    [name] aspirin EC 81 MG tablet Take 1 tablet (81 mg total) by mouth daily. 90 tablet 3  . atorvastatin (LIPITOR) 80 MG tablet TAKE 1 TABLET BY MOUTH EVERY DAY 90 tablet 3  . Calcium Polycarbophil (FIBER-CAPS PO) Take 2 capsules by mouth daily.    . carvedilol (COREG) 3.125 MG tablet Take 3.125 mg by mouth daily.    . Cholecalciferol (VITAMIN D3) 10 MCG (400 UNIT) CAPS Take 400 Units by mouth daily.    . Cyanocobalamin (B-12 PO) Take by mouth daily.    [name] docusate sodium (COLACE) 250 MG capsule Take 250 mg by mouth daily.    . finasteride (PROSCAR) 5 MG tablet Take 1 tablet (5 mg total) by mouth daily. 90 tablet 3  . furosemide (LASIX) 20 MG tablet Take 1 tablet (20 mg total) by mouth daily. 90 tablet 3  . gabapentin (NEURONTIN) 400 MG capsule One three times a day (Patient taking differently: Take 400 mg by mouth 3 (three) times daily. One three times a day)    . HYDROcodone-acetaminophen (NORCO) 10-325 MG tablet Take 1 tablet by mouth every 8 (eight) hours as needed for moderate pain.     . isosorbide mononitrate (IMDUR) 60 MG 24 hr tablet Take 60 mg by mouth daily.    . metoprolol tartrate (LOPRESSOR) 25 MG tablet Take 25 mg by mouth 2 (two) times daily.    . nitroGLYCERIN (NITROSTAT) 0.4 MG SL tablet Place 1 tablet (0.4 mg total) under the tongue every 5 (five) minutes as needed for chest pain. 25 tablet 5  . Omega-3 1000 MG CAPS Take 1,000 mg by mouth daily.    . pantoprazole (PROTONIX) 40 MG tablet TAKE 1 TABLET BY MOUTH TWICE A DAY (30 TO 60 MINUTES BEFORE 1ST  AND LAST MEALS OF THE DAY) 180 tablet 0  . polyethylene glycol powder (GLYCOLAX/MIRALAX) 17 GM/SCOOP powder Take 8.5 g by mouth daily. 255 g 0  . silodosin (RAPAFLO) 8 MG CAPS capsule  Take 8 mg by mouth daily with breakfast.    . Simethicone 180 MG CAPS Take 1 capsule (180 mg total) by mouth 3 (three) times daily as needed. (Patient taking differently: Take 180 mg by mouth daily as needed.)  0  . vitamin E 200 UNIT capsule Take 200 Units by mouth daily.    . potassium chloride (KLOR-CON) 10 MEQ tablet Take by mouth.     No current facility-administered medications for this visit.    Review of Systems: GENERAL: negative for malaise, night sweats HEENT: No changes in hearing or vision, no nose bleeds or other nasal problems. NECK: Negative for lumps, goiter, pain and significant neck swelling RESPIRATORY: Negative for cough, wheezing CARDIOVASCULAR: Negative for chest pain, leg swelling, palpitations, orthopnea GI: SEE HPI MUSCULOSKELETAL: Negative for joint pain or swelling, back pain, and muscle pain. SKIN: Negative for lesions, rash PSYCH: Negative for sleep disturbance, mood disorder and recent psychosocial stressors. HEMATOLOGY Negative for prolonged bleeding, bruising easily, and swollen nodes. ENDOCRINE: Negative for cold or heat intolerance, polyuria, polydipsia and goiter. NEURO: negative for tremor, gait imbalance, syncope and seizures. The remainder of the review of systems is noncontributory.   Physical Exam: BP 116/74 (BP Location: Left Arm, Patient Position: Sitting, Cuff Size: Large)   Pulse (!) 56   Temp 97.6 F (36.4 C) (Oral)   Ht 5\' 8"  (1.727 m)   Wt 168 lb (76.2 kg)   BMI 25.54 kg/m  GENERAL: The patient is AO x3, in no acute distress. HEENT: Head is normocephalic and atraumatic. EOMI are intact. Mouth is well hydrated and without lesions. NECK: Supple. No masses LUNGS: Clear to auscultation. No presence of rhonchi/wheezing/rales. Adequate chest  expansion HEART: RRR, normal s1 and s2. ABDOMEN: Soft, nontender, no guarding, no peritoneal signs, and nondistended. BS +. No masses. EXTREMITIES: Without any cyanosis, clubbing, rash, lesions or edema. NEUROLOGIC: AOx3, no focal motor deficit. SKIN: no jaundice, no rashes  Imaging/Labs: as above  I personally reviewed and interpreted the available labs, imaging and endoscopic files.  Impression and Plan: Christopher [name] is a 75 y.o. male with past medical history of GAVE, GERD, constipation, afib on Eliquis, CAD s/p CABG, constipation, degenerative disc disease, DVT, who presents for follow up of constipation. The patient has presented persistent constipation despite taking polyethylene glycol. He has presented these episodes of constipation chronically which are likely related to slow transit, however function tests were normal recently. I advised him to increase the intake of MiraLAX to 3 times a day and can go up to 3 times a day. If this fails to improve his symptoms we will need to switch to another agent such as Linzess. In terms of his rectal bleeding, it is likely hemorrhoidal in nature as he had recent colonoscopy from 3 years ago which did not show any masses or vascular alterations that would explain it. As he has been having constipation, this could have worsened his hemorrhoids. Finally, the patient has elevation of his liver enzymes, which possibly are related to alcoholic steatohepatitis, I counseled the patient about the importance of decreasing his alcohol intake. We will check for markers for autoimmune hepatitis and acute viral hepatitis, although this seems less likely.  - Start taking Miralax 1 cupful every 12 hours. If after two weeks there is no improvement, increase to 1 cup every 8 hours - Check ANA, ASMA, IgG, acute hep panel - Decrease alcohol intake  All questions were answered.      Harvel Quale, MD  Gastroenterology and Hepatology [facility]

## 2021-02-13 NOTE — Patient Instructions (Addendum)
Start taking Miralax 1 cupful every 12 hours. If after two weeks there is no improvement, increase to 1 cup every 8 hours Perform blood workup Decrease alcohol intake

## 2021-02-14 ENCOUNTER — Ambulatory Visit
Admission: RE | Admit: 2021-02-14 | Discharge: 2021-02-14 | Disposition: A | Discharge status: Home / Self Care | Payer: Medicare Other | Source: Ambulatory Visit | Attending: Neurosurgery | Admitting: Neurosurgery

## 2021-02-14 DIAGNOSIS — M546 Pain in thoracic spine: Secondary | ICD-10-CM

## 2021-02-14 DIAGNOSIS — M961 Postlaminectomy syndrome, not elsewhere classified: Secondary | ICD-10-CM

## 2021-02-14 DIAGNOSIS — M5134 Other intervertebral disc degeneration, thoracic region: Secondary | ICD-10-CM | POA: Diagnosis not present

## 2021-02-14 DIAGNOSIS — M542 Cervicalgia: Secondary | ICD-10-CM

## 2021-02-14 DIAGNOSIS — M5125 Other intervertebral disc displacement, thoracolumbar region: Secondary | ICD-10-CM | POA: Diagnosis not present

## 2021-02-14 DIAGNOSIS — M5126 Other intervertebral disc displacement, lumbar region: Secondary | ICD-10-CM | POA: Diagnosis not present

## 2021-02-14 DIAGNOSIS — M4802 Spinal stenosis, cervical region: Secondary | ICD-10-CM | POA: Diagnosis not present

## 2021-02-14 DIAGNOSIS — M4322 Fusion of spine, cervical region: Secondary | ICD-10-CM | POA: Diagnosis not present

## 2021-02-14 DIAGNOSIS — M48061 Spinal stenosis, lumbar region without neurogenic claudication: Secondary | ICD-10-CM | POA: Diagnosis not present

## 2021-02-14 MED ORDER — IOPAMIDOL (ISOVUE-M 300) INJECTION 61%
10.0000 mL | Freq: Once | INTRAMUSCULAR | Status: AC | PRN
  Administered 2021-02-14: 10 mL via INTRATHECAL

## 2021-02-14 MED ORDER — DIAZEPAM 5 MG PO TABS
5.0000 mg | ORAL_TABLET | Freq: Once | ORAL | Status: AC
  Administered 2021-02-14: 5 mg via ORAL

## 2021-02-14 NOTE — Discharge Instructions (Signed)

## 2021-02-15 LAB — HEPATITIS PANEL, ACUTE
Hep A IgM: NONREACTIVE
Hep B C IgM: NONREACTIVE
Hepatitis B Surface Ag: NONREACTIVE
Hepatitis C Ab: NONREACTIVE
SIGNAL TO CUT-OFF: 0.01 (ref <1.00)

## 2021-02-15 LAB — IGG: IgG (Immunoglobin G), Serum: 777 mg/dL (ref 600–1540)

## 2021-02-15 LAB — ANA: Anti Nuclear Antibody (ANA): NEGATIVE

## 2021-02-15 LAB — ANTI-SMOOTH MUSCLE ANTIBODY, IGG: Actin (Smooth Muscle) Antibody (IGG): <20 U (ref <20)

## 2021-02-15 LAB — HEPATITIS B CORE ANTIBODY, TOTAL: Hep B Core Total Ab: NONREACTIVE

## 2021-02-15 LAB — HEPATITIS A ANTIBODY, TOTAL: Hepatitis A AB,Total: NONREACTIVE

## 2021-02-15 LAB — HEPATITIS B SURFACE ANTIBODY,QUALITATIVE: Hep B S Ab: NONREACTIVE

## 2021-02-21 ENCOUNTER — Encounter (INDEPENDENT_AMBULATORY_CARE_PROVIDER_SITE_OTHER): Payer: Self-pay

## 2021-02-26 ENCOUNTER — Other Ambulatory Visit: Payer: Self-pay | Admitting: Internal Medicine

## 2021-02-27 ENCOUNTER — Ambulatory Visit: Payer: Medicare Other | Admitting: Urology

## 2021-03-14 ENCOUNTER — Other Ambulatory Visit: Payer: Self-pay

## 2021-03-14 ENCOUNTER — Encounter: Payer: Self-pay | Admitting: Internal Medicine

## 2021-03-14 ENCOUNTER — Ambulatory Visit: Payer: Medicare Other | Admitting: Internal Medicine

## 2021-03-14 VITALS — BP 126/68 | HR 54 | Ht 68.0 in | Wt 172.0 lb

## 2021-03-14 DIAGNOSIS — M25561 Pain in right knee: Secondary | ICD-10-CM | POA: Diagnosis not present

## 2021-03-14 DIAGNOSIS — M542 Cervicalgia: Secondary | ICD-10-CM | POA: Diagnosis not present

## 2021-03-14 DIAGNOSIS — I25810 Atherosclerosis of coronary artery bypass graft(s) without angina pectoris: Secondary | ICD-10-CM | POA: Diagnosis not present

## 2021-03-14 DIAGNOSIS — I4891 Unspecified atrial fibrillation: Secondary | ICD-10-CM | POA: Diagnosis not present

## 2021-03-14 DIAGNOSIS — Z9689 Presence of other specified functional implants: Secondary | ICD-10-CM | POA: Diagnosis not present

## 2021-03-14 DIAGNOSIS — M961 Postlaminectomy syndrome, not elsewhere classified: Secondary | ICD-10-CM | POA: Diagnosis not present

## 2021-03-14 DIAGNOSIS — M546 Pain in thoracic spine: Secondary | ICD-10-CM | POA: Diagnosis not present

## 2021-03-14 NOTE — Progress Notes (Signed)
HPI Mr. Sawa returns for followup. He is a pleasant 75 yo man with CAD, s/p CABG, HTN, PAF, on amiodarone who has c/o problems with worsening sob. He has not had syncope or palpitations. He was bradycardic. He has sleep apnea but was intolerant and is not using his CPAP. No chest pain. No edema. He is pending a spinal injection.  Allergies  Allergen Reactions  . Tylenol [Acetaminophen]     Stomach pain   . Morphine And Related Nausea And Vomiting  . Propofol Nausea And Vomiting     Current Outpatient Medications  Medication Sig Dispense Refill  . alfuzosin (UROXATRAL) 10 MG 24 hr tablet Take 1 tablet (10 mg total) by mouth daily with breakfast. 30 tablet 11  . ALPRAZolam (XANAX) 1 MG tablet Take 1 mg by mouth at bedtime.  0  . amiodarone (PACERONE) 200 MG tablet Take 200 mg by mouth daily.    [name] apixaban (ELIQUIS) 5 MG TABS tablet Take 1 tablet (5 mg total) by mouth 2 (two) times daily.    . Ascorbic Acid (VITA-C PO) Take by mouth.    [name] aspirin EC 81 MG tablet Take 1 tablet (81 mg total) by mouth daily. 90 tablet 3  . atorvastatin (LIPITOR) 80 MG tablet TAKE 1 TABLET BY MOUTH EVERY DAY 90 tablet 3  . Calcium Polycarbophil (FIBER-CAPS PO) Take 2 capsules by mouth daily.    . Cholecalciferol (VITAMIN D3) 10 MCG (400 UNIT) CAPS Take 400 Units by mouth daily.    . Cyanocobalamin (B-12 PO) Take by mouth daily.    [name] docusate sodium (COLACE) 250 MG capsule Take 250 mg by mouth daily.    . finasteride (PROSCAR) 5 MG tablet Take 1 tablet (5 mg total) by mouth daily. 90 tablet 3  . furosemide (LASIX) 20 MG tablet Take 1 tablet (20 mg total) by mouth daily. 90 tablet 3  . gabapentin (NEURONTIN) 400 MG capsule One three times a day (Patient taking differently: Take 400 mg by mouth 3 (three) times daily. One three times a day)    . HYDROcodone-acetaminophen (NORCO) 10-325 MG tablet Take 1 tablet by mouth every 8 (eight) hours as needed for moderate pain.     . isosorbide mononitrate (IMDUR)  60 MG 24 hr tablet Take 60 mg by mouth daily.    . metoprolol tartrate (LOPRESSOR) 25 MG tablet Take 25 mg by mouth 2 (two) times daily.    . nitroGLYCERIN (NITROSTAT) 0.4 MG SL tablet Place 1 tablet (0.4 mg total) under the tongue every 5 (five) minutes as needed for chest pain. 25 tablet 5  . Omega-3 1000 MG CAPS Take 1,000 mg by mouth daily.    . pantoprazole (PROTONIX) 40 MG tablet TAKE 1 TABLET BY MOUTH TWICE A DAY (30 TO 60 MINUTES BEFORE 1ST AND LAST MEALS OF THE DAY) 180 tablet 0  . polyethylene glycol powder (GLYCOLAX/MIRALAX) 17 GM/SCOOP powder Take 8.5 g by mouth daily. 255 g 0  . potassium chloride (KLOR-CON) 10 MEQ tablet Take by mouth.    . silodosin (RAPAFLO) 8 MG CAPS capsule Take 8 mg by mouth daily with breakfast.    . Simethicone 180 MG CAPS Take 1 capsule (180 mg total) by mouth 3 (three) times daily as needed. (Patient taking differently: Take 180 mg by mouth daily as needed.)  0  . Zinc Sulfate (ZINC 15 PO) Take by mouth.    . carvedilol (COREG) 3.125 MG tablet Take 3.125 mg by mouth  daily. (Patient not taking: Reported on 03/14/2021)    . vitamin E 200 UNIT capsule Take 200 Units by mouth daily.     No current facility-administered medications for this visit.     Past Medical History:  Diagnosis Date  . A-fib ([place])   . Arthritis    "all over my body" (06/17/2017)  . BPH (benign prostatic hyperplasia)    takes Proscar daily  . Chronic back pain    DDD; "upper and lower" (06/17/2017)  . Constipation    takes Colace daily  . DDD (degenerative disc disease), cervical   . DVT (deep venous thrombosis) ([facility])    left peroneal vein 07/2017 (in setting of recent left TKA)  . Dysrhythmia    history of Atrial fibrillation, no long has this  . Enlarged prostate   . GERD (gastroesophageal reflux disease)    takes Nexium daily  . H/O hiatal hernia   . History of colon polyps    benign  . History of gastric ulcer   . Insomnia    takes Xanax nightly  . Joint capsule tear     left knee  . Joint pain   . Joint swelling   . Macular degeneration   . Muscle spasm of back    takes Valium daily as needed  . PAF (paroxysmal atrial fibrillation) ([place])   . Peripheral neuropathy    takes Gabapentin daily  . PONV (postoperative nausea and vomiting)   . Sleep apnea    pt does not wear a cpap  . Urinary hesitancy   . Weakness    numbness and tingling    ROS:   All systems reviewed and negative except as noted in the HPI.   Past Surgical History:  Procedure Laterality Date  . ANTERIOR CERVICAL DECOMP/DISCECTOMY FUSION  ~ 1991   "took bone from hip"  . ANTERIOR CERVICAL DECOMP/DISCECTOMY FUSION  X 2   "put plate in"  . ANTERIOR CERVICAL DISCECTOMY  <11/2005    5-6 and 4-5./notes 05/02/2011  . APPENDECTOMY    . BACK SURGERY    . BIOPSY  05/30/2018   Procedure: BIOPSY;  Surgeon: [name], MD;  [facility];  Service: Endoscopy;;  esophagus  . CARDIAC CATHETERIZATION    . CARPAL TUNNEL RELEASE Bilateral 10/2005 - 11/2005   right-left [facility] Endo 05/02/2011  . CATARACT EXTRACTION W/PHACO Right 05/24/2014   Procedure: CATARACT EXTRACTION PHACO AND INTRAOCULAR LENS PLACEMENT (IOC);  Surgeon: [name], MD;  [facility];  Service: Ophthalmology;  Laterality: Right;  CDE 6.74  . CATARACT EXTRACTION W/PHACO Left 06/17/2014   Procedure: CATARACT EXTRACTION PHACO AND INTRAOCULAR LENS PLACEMENT (IOC);  Surgeon: [name], MD;  [facility];  Service: Ophthalmology;  Laterality: Left;  CDE:4.65  . COLONOSCOPY WITH ESOPHAGOGASTRODUODENOSCOPY (EGD) N/A 01/22/2013   Procedure: COLONOSCOPY WITH ESOPHAGOGASTRODUODENOSCOPY (EGD);  Surgeon: [name], MD;  [facility];  Service: Endoscopy;  Laterality: N/A;  140  . COLONOSCOPY WITH PROPOFOL N/A 05/30/2018   Procedure: COLONOSCOPY WITH PROPOFOL;  Surgeon: [name], MD;  [facility];  Service: Endoscopy;  Laterality: N/A;  . ESOPHAGOGASTRODUODENOSCOPY (EGD) WITH PROPOFOL N/A  05/30/2018   Procedure: ESOPHAGOGASTRODUODENOSCOPY (EGD) WITH PROPOFOL;  Surgeon: [name], MD;  [facility];  Service: Endoscopy;  Laterality: N/A;  . EYE SURGERY Bilateral    "cleaned film w/laser; since my 1st cataract OR"  . JOINT REPLACEMENT    . LEFT HEART CATH AND CORONARY ANGIOGRAPHY  N/A 02/16/2019   Procedure: LEFT HEART CATH AND CORONARY ANGIOGRAPHY;  Surgeon: Belva Crome, MD;  Location: [place] [facility];  Service: Cardiovascular;  Laterality: N/A;  . POSTERIOR LUMBAR FUSION     "put a plate in"  . QUADRICEPS TENDON REPAIR Left 07/01/2017  . QUADRICEPS TENDON REPAIR Left 07/01/2017   Procedure: REPAIR QUADRICEP TENDON;  Surgeon: [name], MD;  Location: [place];  Service: Orthopedics;  Laterality: Left;  . SHOULDER ARTHROSCOPY Bilateral   . SPINAL CORD STIMULATOR INSERTION N/A 09/23/2015   Procedure: LUMBAR SPINAL CORD STIMULATOR INSERTION;  Surgeon: [name], MD;  Location: [place] [facility];  Service: Neurosurgery;  Laterality: N/A;  LUMBAR SPINAL CORD STIMULATOR INSERTION  . TOTAL KNEE ARTHROPLASTY Left 06/17/2017  . TOTAL KNEE ARTHROPLASTY Left 06/17/2017   Procedure: TOTAL KNEE ARTHROPLASTY;  Surgeon: [name], MD;  Location: [place];  Service: Orthopedics;  Laterality: Left;  . TOTAL KNEE ARTHROPLASTY Right 12/16/2017   Procedure: TOTAL KNEE ARTHROPLASTY;  Surgeon: [name], MD;  Location: [place];  Service: Orthopedics;  Laterality: Right;     History reviewed. No pertinent family history.   Social History   Socioeconomic History  . Marital status: Single    Spouse name: Not on file  . Number of children: Not on file  . Years of education: Not on file  . Highest education level: Not on file  Occupational History  . Not on file  Tobacco Use  . Smoking status: Former Smoker    Packs/day: 1.00    Years: 30.00    Pack years: 30.00    Types: Cigarettes    Quit date: 01/14/1987    Years since quitting: 34.1  . Smokeless tobacco: Former Systems developer     Types: Chew  . Tobacco comment: quit smoking  & chewing by 1987  Vaping Use  . Vaping Use: Never used  Substance and Sexual Activity  . Alcohol use: Yes    Alcohol/week: 15.0 standard drinks    Types: 3 Glasses of wine, 12 Cans of beer per week    Comment: 2-3 times weekly   . Drug use: No  . Sexual activity: Not Currently  Other Topics Concern  . Not on file  Social History Narrative  . Not on file   Social Determinants of Health   Financial Resource Strain: Not on file  Food Insecurity: Not on file  Transportation Needs: Not on file  Physical Activity: Not on file  Stress: Not on file  Social Connections: Not on file  Intimate Partner Violence: Not on file     BP 126/68   Pulse (!) 54   Ht 5\' 8"  (1.727 m)   Wt 172 lb (78 kg)   SpO2 95%   BMI 26.15 kg/m   Physical Exam:  Well appearing 75 yo man, NAD HEENT: Unremarkable Neck:  No JVD, no thyromegally Lymphatics:  No adenopathy Back:  No CVA tenderness Lungs:  Clear with no wheezes HEART:  Regular rate rhythm, no murmurs, no rubs, no clicks Abd:  soft, positive bowel sounds, no organomegally, no rebound, no guarding Ext:  2 plus pulses, no edema, no cyanosis, no clubbing Skin:  No rashes no nodules Neuro:  CN II through XII intact, motor grossly intact   Assess/Plan: 1. PAF - he is maintaining NSR. He will continue his amiodarone. 2. Preoperative eval - he is low risk for spinal injection. He may come off of his Eliquis for up to 3 days prior to injection. Restart  the Eliquis after injection when bleeding risk is acceptable. 3. CAD - he denies anginal symptoms. We will follow. 4. Sleep apnea - he has been prescribed CPAP but has not been tolerant.   Carleene Overlie Radwan Cowley,MD

## 2021-03-14 NOTE — Patient Instructions (Signed)
Medication Instructions:  Your physician recommends that you continue on your current medications as directed. Please refer to the Current Medication list given to you today.  *If you need a refill on your cardiac medications before your next appointment, please call your pharmacy*   Lab Work: NONE   If you have labs (blood work) drawn today and your tests are completely normal, you will receive your results only by: . MyChart Message (if you have MyChart) OR . A paper copy in the mail If you have any lab test that is abnormal or we need to change your treatment, we will call you to review the results.   Testing/Procedures: NONE    Follow-Up: At [facility], you and your health needs are our priority.  As part of our continuing mission to provide you with exceptional heart care, we have created designated Provider Care Teams.  These Care Teams include your primary Cardiologist (physician) and Advanced Practice Providers (APPs -  Physician Assistants and Nurse Practitioners) who all work together to provide you with the care you need, when you need it.  We recommend signing up for the patient portal called "MyChart".  Sign up information is provided on this After Visit Summary.  MyChart is used to connect with patients for Virtual Visits (Telemedicine).  Patients are able to view lab/test results, encounter notes, upcoming appointments, etc.  Non-urgent messages can be sent to your provider as well.   To learn more about what you can do with MyChart, go to [email or web address].    Your next appointment:   1 year(s)  The format for your next appointment:   In Person  Provider:   Gregg Taylor, MD   Other Instructions Thank you for choosing [facility]!    

## 2021-03-15 ENCOUNTER — Encounter: Payer: Self-pay | Admitting: Urology

## 2021-03-15 ENCOUNTER — Ambulatory Visit (INDEPENDENT_AMBULATORY_CARE_PROVIDER_SITE_OTHER): Payer: Medicare Other | Admitting: Urology

## 2021-03-15 VITALS — BP 118/70 | HR 61 | Temp 98.5°F | Ht 68.0 in | Wt 172.0 lb

## 2021-03-15 DIAGNOSIS — N138 Other obstructive and reflux uropathy: Secondary | ICD-10-CM

## 2021-03-15 DIAGNOSIS — I10 Essential (primary) hypertension: Secondary | ICD-10-CM | POA: Diagnosis not present

## 2021-03-15 DIAGNOSIS — N401 Enlarged prostate with lower urinary tract symptoms: Secondary | ICD-10-CM | POA: Diagnosis not present

## 2021-03-15 DIAGNOSIS — M5116 Intervertebral disc disorders with radiculopathy, lumbar region: Secondary | ICD-10-CM | POA: Diagnosis not present

## 2021-03-15 DIAGNOSIS — M5080 Other cervical disc disorders, unspecified cervical region: Secondary | ICD-10-CM | POA: Diagnosis not present

## 2021-03-15 DIAGNOSIS — R945 Abnormal results of liver function studies: Secondary | ICD-10-CM | POA: Diagnosis not present

## 2021-03-15 DIAGNOSIS — I480 Paroxysmal atrial fibrillation: Secondary | ICD-10-CM | POA: Diagnosis not present

## 2021-03-15 DIAGNOSIS — E782 Mixed hyperlipidemia: Secondary | ICD-10-CM | POA: Diagnosis not present

## 2021-03-15 DIAGNOSIS — R7301 Impaired fasting glucose: Secondary | ICD-10-CM | POA: Diagnosis not present

## 2021-03-15 DIAGNOSIS — G4733 Obstructive sleep apnea (adult) (pediatric): Secondary | ICD-10-CM | POA: Diagnosis not present

## 2021-03-15 DIAGNOSIS — K219 Gastro-esophageal reflux disease without esophagitis: Secondary | ICD-10-CM | POA: Diagnosis not present

## 2021-03-15 LAB — URINALYSIS, ROUTINE W REFLEX MICROSCOPIC
Bilirubin, UA: NEGATIVE
Glucose, UA: NEGATIVE
Ketones, UA: NEGATIVE
Leukocytes,UA: NEGATIVE
Nitrite, UA: NEGATIVE
Protein,UA: NEGATIVE
RBC, UA: NEGATIVE
Specific Gravity, UA: 1.015 (ref 1.005–1.030)
Urobilinogen, Ur: 0.2 mg/dL (ref 0.2–1.0)
pH, UA: 5.5 (ref 5.0–7.5)

## 2021-03-15 MED ORDER — CIPROFLOXACIN HCL 500 MG PO TABS
500.0000 mg | ORAL_TABLET | Freq: Once | ORAL | Status: AC
  Administered 2021-03-15: 500 mg via ORAL

## 2021-03-15 NOTE — Progress Notes (Signed)
   03/15/21  CC:  Chief Complaint  Patient presents with  . Cysto    HPI: [name] is a 75yo her for cystoscopy for BPH surgical planning  Blood pressure 118/70, pulse 61, temperature 98.5 F (36.9 C), height 5\' 8"  (1.727 m), weight 172 lb (78 kg). NED. A&Ox3.   No respiratory distress   Abd soft, NT, ND Normal phallus with bilateral descended testicles  Cystoscopy Procedure Note  Patient identification was confirmed, informed consent was obtained, and patient was prepped using Betadine solution.  Lidocaine jelly was administered per urethral meatus.     Pre-Procedure: - Inspection reveals a normal caliber ureteral meatus.  Procedure: The flexible cystoscope was introduced without difficulty - No urethral strictures/lesions are present. - Enlarged prostate no median lobe - Normal bladder neck - Bilateral ureteral orifices identified - Bladder mucosa  reveals no ulcers, tumors, or lesions - No bladder stones - No trabeculation  Retroflexion shows no intravesical prostatic protrution   Post-Procedure: - Patient tolerated the procedure well  Assessment/ Plan: We discussed the management of his BPH including continued medical therapy, Rezum, Urolift, TURP and simple prostatectomy. After discussing the options the patient has elected to proceed with continued medical therapy. He will followup in 6 months with a PVR.    No follow-ups on file.  Nicolette Bang, MD

## 2021-03-15 NOTE — Progress Notes (Signed)
Urological Symptom Review  Patient is experiencing the following symptoms: Stream starts and stops Trouble starting stream   Review of Systems  Gastrointestinal (upper)  : Negative for upper GI symptoms  Gastrointestinal (lower) : Negative for lower GI symptoms  Constitutional : Negative for symptoms  Skin: Negative for skin symptoms  Eyes: Negative for eye symptoms  Ear/Nose/Throat : Negative for Ear/Nose/Throat symptoms  Hematologic/Lymphatic: Negative for Hematologic/Lymphatic symptoms  Cardiovascular : Negative for cardiovascular symptoms  Respiratory : Negative for respiratory symptoms  Endocrine: Negative for endocrine symptoms  Musculoskeletal: Back pain Joint pain  Neurological: Negative for neurological symptoms  Psychologic: Negative for psychiatric symptoms

## 2021-03-21 NOTE — Patient Instructions (Signed)

## 2021-03-27 DIAGNOSIS — M19011 Primary osteoarthritis, right shoulder: Secondary | ICD-10-CM | POA: Diagnosis not present

## 2021-04-03 ENCOUNTER — Other Ambulatory Visit: Payer: Self-pay | Admitting: Internal Medicine

## 2021-04-27 DIAGNOSIS — K219 Gastro-esophageal reflux disease without esophagitis: Secondary | ICD-10-CM | POA: Diagnosis not present

## 2021-05-09 ENCOUNTER — Other Ambulatory Visit: Payer: Self-pay | Admitting: Urology

## 2021-05-18 ENCOUNTER — Ambulatory Visit (INDEPENDENT_AMBULATORY_CARE_PROVIDER_SITE_OTHER): Payer: Medicare Other | Admitting: Gastroenterology

## 2021-05-18 DIAGNOSIS — M25561 Pain in right knee: Secondary | ICD-10-CM | POA: Diagnosis not present

## 2021-05-18 DIAGNOSIS — M5416 Radiculopathy, lumbar region: Secondary | ICD-10-CM | POA: Diagnosis not present

## 2021-06-15 DIAGNOSIS — K219 Gastro-esophageal reflux disease without esophagitis: Secondary | ICD-10-CM | POA: Diagnosis not present

## 2021-07-17 DIAGNOSIS — Z9689 Presence of other specified functional implants: Secondary | ICD-10-CM | POA: Diagnosis not present

## 2021-07-17 DIAGNOSIS — M25561 Pain in right knee: Secondary | ICD-10-CM | POA: Diagnosis not present

## 2021-07-17 DIAGNOSIS — M961 Postlaminectomy syndrome, not elsewhere classified: Secondary | ICD-10-CM | POA: Diagnosis not present

## 2021-07-21 ENCOUNTER — Ambulatory Visit: Payer: Medicare Other | Admitting: Pulmonary Disease

## 2021-07-21 ENCOUNTER — Encounter: Payer: Self-pay | Admitting: Pulmonary Disease

## 2021-07-21 ENCOUNTER — Other Ambulatory Visit: Payer: Self-pay

## 2021-07-21 VITALS — BP 100/60 | HR 50 | Temp 96.4°F | Ht 68.0 in | Wt 165.0 lb

## 2021-07-21 DIAGNOSIS — G4733 Obstructive sleep apnea (adult) (pediatric): Secondary | ICD-10-CM

## 2021-07-21 DIAGNOSIS — J3089 Other allergic rhinitis: Secondary | ICD-10-CM | POA: Diagnosis not present

## 2021-07-21 DIAGNOSIS — Z789 Other specified health status: Secondary | ICD-10-CM

## 2021-07-21 NOTE — Progress Notes (Signed)
[facility]  Chief Complaint  Patient presents with   Follow-up    Patient states he does not use his Bipap machine and feels like his breathing is fine.     Constitutional:  BP 100/60 (BP Location: Left Arm, Patient Position: Sitting, Cuff Size: Normal)   Pulse (!) 50   Temp (!) 96.4 F (35.8 C) (Oral)   Ht '5\' 8"'$  (1.727 m)   Wt 165 lb (74.8 kg)   SpO2 99%   BMI 25.09 kg/m   Past Medical History:  A fib, OA, BPH, Back pain, Constipation, DVT 2018, GERD, HH, Colon polyp, PUD, Macular degeneration, Neuropathy, CAD s/p CABG  Past Surgical History:  He  has a past surgical history that includes Appendectomy; Colonoscopy with esophagogastroduodenoscopy (egd) (N/A, 01/22/2013); Carpal tunnel release (Bilateral, 10/2005 - 11/2005); Cataract extraction w/PHACO (Right, 05/24/2014); Cataract extraction w/PHACO (Left, 06/17/2014); Spinal cord stimulator insertion (N/A, 09/23/2015); Shoulder arthroscopy (Bilateral); Anterior cervical discectomy (<11/2005); Total knee arthroplasty (Left, 06/17/2017); Anterior cervical decomp/discectomy fusion (~ 1991); Anterior cervical decomp/discectomy fusion (X 2); Back surgery; Posterior lumbar fusion; Eye surgery (Bilateral); Cardiac catheterization; Total knee arthroplasty (Left, 06/17/2017); Joint replacement; Quadriceps tendon repair (Left, 07/01/2017); Quadriceps tendon repair (Left, 07/01/2017); Total knee arthroplasty (Right, 12/16/2017); Colonoscopy with propofol (N/A, 05/30/2018); Esophagogastroduodenoscopy (egd) with propofol (N/A, 05/30/2018); biopsy (05/30/2018); and LEFT HEART CATH AND CORONARY ANGIOGRAPHY (N/A, 02/16/2019).  Brief Summary:  Christopher [name] is a 75 y.o. male former smoker with obstructive sleep apnea.      Subjective:   He hasn't uses Bipap for months.  Felt like this would smother him.  He feels his sleep is okay as long as he sleeps on his side.  Gets sinus congestion and runny nose in the  morning.  Not having dyspnea, chest pain, cough, or wheeze.  Physical Exam:   Appearance - well kempt   ENMT - no sinus tenderness, no oral exudate, no LAN, Mallampati 3 airway, no stridor  Respiratory - equal breath sounds bilaterally, no wheezing or rales  CV - s1s2 regular rate and rhythm, no murmurs  Ext - no clubbing, no edema  Skin - no rashes  Psych - normal mood and affect    Pulmonary testing:  PFT 10/28/20 >> FEV1 2.25 (80%), FEV1% 80, TLC 5.30 (79%), DLCO 68%  Chest Imaging:  CT chest 01/22/19 >> atherosclerosis, mild paraseptal emphysema, basilar scarring  Sleep Tests:  PSG 08/31/16 >> AHI 2, SpO2 low 85% PSG 10/20/19 >> AHI 17.6, SpO2 low 82%  Cardiac Tests:  Echo 12/25/19 >> EF 55 to 60%  Social History:  He  reports that he quit smoking about 34 years ago. His smoking use included cigarettes. He has a 30.00 pack-year smoking history. He has quit using smokeless tobacco.  His smokeless tobacco use included chew. He reports current alcohol use of about 15.0 standard drinks of alcohol per week. He reports that he does not use drugs.  Family History:  His family history is not on file.     Assessment/Plan:   Obstructive sleep apnea. - no longer using Bipap - continue positional therapy  Rhinitis. - likely from allergies related to dust mite exposure in pillow/mattress/carpeting in bedroom - discussed environmental control techniques - prn OTC flonase  Paroxysmal atrial fibrillation, Coronary artery disease. - followed by Dr. Cristopher Peru and Dr. Jenkins Rouge with [facility]  Time Spent Involved in Patient Care on Day of Examination:  21 minutes  Follow up:  Patient Instructions  Follow up as needed  Medication List:   Allergies as of 07/21/2021       Reactions   Tylenol [acetaminophen]    Stomach pain    Morphine And Related Nausea And Vomiting   Propofol Nausea And Vomiting        Medication List        Accurate as of  July 21, 2021  9:17 AM. If you have any questions, ask your nurse or doctor.          alfuzosin 10 MG 24 hr tablet Commonly known as: UROXATRAL Take 1 tablet (10 mg total) by mouth daily with breakfast.   ALPRAZolam 1 MG tablet Commonly known as: XANAX Take 1 mg by mouth at bedtime.   amiodarone 200 MG tablet Commonly known as: PACERONE TAKE 1 TABLET BY MOUTH EVERY DAY   apixaban 5 MG Tabs tablet Commonly known as: ELIQUIS Take 1 tablet (5 mg total) by mouth 2 (two) times daily.   aspirin EC 81 MG tablet Take 1 tablet (81 mg total) by mouth daily.   atorvastatin 80 MG tablet Commonly known as: LIPITOR TAKE 1 TABLET BY MOUTH EVERY DAY   B-12 PO Take by mouth daily.   carvedilol 3.125 MG tablet Commonly known as: COREG Take 3.125 mg by mouth daily.   docusate sodium 250 MG capsule Commonly known as: COLACE Take 250 mg by mouth daily.   FIBER-CAPS PO Take 2 capsules by mouth daily.   finasteride 5 MG tablet Commonly known as: PROSCAR Take 1 tablet (5 mg total) by mouth daily.   furosemide 20 MG tablet Commonly known as: LASIX Take 1 tablet (20 mg total) by mouth daily.   gabapentin 400 MG capsule Commonly known as: NEURONTIN One three times a day What changed:  how much to take how to take this when to take this   HYDROcodone-acetaminophen 10-325 MG tablet Commonly known as: NORCO Take 1 tablet by mouth every 8 (eight) hours as needed for moderate pain.   isosorbide mononitrate 60 MG 24 hr tablet Commonly known as: IMDUR Take 60 mg by mouth daily.   metoprolol tartrate 25 MG tablet Commonly known as: LOPRESSOR Take 25 mg by mouth 2 (two) times daily.   nitroGLYCERIN 0.4 MG SL tablet Commonly known as: NITROSTAT Place 1 tablet (0.4 mg total) under the tongue every 5 (five) minutes as needed for chest pain.   Omega-3 1000 MG Caps Take 1,000 mg by mouth daily.   pantoprazole 40 MG tablet Commonly known as: PROTONIX TAKE 1 TABLET BY MOUTH  TWICE A DAY (30 TO 60 MINUTES BEFORE 1ST AND LAST MEALS OF THE DAY)   polyethylene glycol powder 17 GM/SCOOP powder Commonly known as: GLYCOLAX/MIRALAX Take 8.5 g by mouth daily.   potassium chloride 10 MEQ tablet Commonly known as: KLOR-CON Take by mouth.   silodosin 8 MG Caps capsule Commonly known as: RAPAFLO TAKE 1 CAPSULE BY MOUTH EVERYDAY AT BEDTIME   Simethicone 180 MG Caps Take 1 capsule (180 mg total) by mouth 3 (three) times daily as needed. What changed: when to take this   VITA-C PO Take by mouth.   Vitamin D3 10 MCG (400 UNIT) Caps Take 400 Units by mouth daily.   vitamin E 200 UNIT capsule Take 200 Units by mouth daily.   ZINC 15 PO Take by mouth.        Signature:  Chesley Mires, MD [place] Pager - [phone] 07/21/2021, 9:17  AM

## 2021-07-21 NOTE — Patient Instructions (Signed)
Follow up as needed

## 2021-08-06 ENCOUNTER — Other Ambulatory Visit: Payer: Self-pay | Admitting: Urology

## 2021-08-10 DIAGNOSIS — R7301 Impaired fasting glucose: Secondary | ICD-10-CM | POA: Diagnosis not present

## 2021-08-10 DIAGNOSIS — E782 Mixed hyperlipidemia: Secondary | ICD-10-CM | POA: Diagnosis not present

## 2021-08-14 DIAGNOSIS — G4733 Obstructive sleep apnea (adult) (pediatric): Secondary | ICD-10-CM | POA: Diagnosis not present

## 2021-08-14 DIAGNOSIS — K8020 Calculus of gallbladder without cholecystitis without obstruction: Secondary | ICD-10-CM | POA: Diagnosis not present

## 2021-08-14 DIAGNOSIS — I480 Paroxysmal atrial fibrillation: Secondary | ICD-10-CM | POA: Diagnosis not present

## 2021-08-14 DIAGNOSIS — K219 Gastro-esophageal reflux disease without esophagitis: Secondary | ICD-10-CM | POA: Diagnosis not present

## 2021-08-14 DIAGNOSIS — R7301 Impaired fasting glucose: Secondary | ICD-10-CM | POA: Diagnosis not present

## 2021-08-14 DIAGNOSIS — E782 Mixed hyperlipidemia: Secondary | ICD-10-CM | POA: Diagnosis not present

## 2021-08-14 DIAGNOSIS — M5030 Other cervical disc degeneration, unspecified cervical region: Secondary | ICD-10-CM | POA: Diagnosis not present

## 2021-08-14 DIAGNOSIS — R0600 Dyspnea, unspecified: Secondary | ICD-10-CM | POA: Diagnosis not present

## 2021-08-14 DIAGNOSIS — I10 Essential (primary) hypertension: Secondary | ICD-10-CM | POA: Diagnosis not present

## 2021-08-14 DIAGNOSIS — Z0001 Encounter for general adult medical examination with abnormal findings: Secondary | ICD-10-CM | POA: Diagnosis not present

## 2021-08-14 DIAGNOSIS — I2510 Atherosclerotic heart disease of native coronary artery without angina pectoris: Secondary | ICD-10-CM | POA: Diagnosis not present

## 2021-08-16 ENCOUNTER — Other Ambulatory Visit: Payer: Self-pay | Admitting: Urology

## 2021-08-16 DIAGNOSIS — N138 Other obstructive and reflux uropathy: Secondary | ICD-10-CM

## 2021-09-13 ENCOUNTER — Ambulatory Visit: Payer: Medicare Other | Admitting: Urology

## 2021-09-13 ENCOUNTER — Encounter: Payer: Self-pay | Admitting: Urology

## 2021-09-13 ENCOUNTER — Other Ambulatory Visit: Payer: Self-pay

## 2021-09-13 VITALS — BP 129/70 | HR 49 | Temp 97.9°F | Ht 68.0 in | Wt 159.0 lb

## 2021-09-13 DIAGNOSIS — R351 Nocturia: Secondary | ICD-10-CM

## 2021-09-13 DIAGNOSIS — R3911 Hesitancy of micturition: Secondary | ICD-10-CM

## 2021-09-13 DIAGNOSIS — N401 Enlarged prostate with lower urinary tract symptoms: Secondary | ICD-10-CM

## 2021-09-13 DIAGNOSIS — N138 Other obstructive and reflux uropathy: Secondary | ICD-10-CM | POA: Diagnosis not present

## 2021-09-13 LAB — URINALYSIS, ROUTINE W REFLEX MICROSCOPIC
Bilirubin, UA: NEGATIVE
Glucose, UA: NEGATIVE
Ketones, UA: NEGATIVE
Leukocytes,UA: NEGATIVE
Nitrite, UA: NEGATIVE
Protein,UA: NEGATIVE
RBC, UA: NEGATIVE
Specific Gravity, UA: 1.025 (ref 1.005–1.030)
Urobilinogen, Ur: 0.2 mg/dL (ref 0.2–1.0)
pH, UA: 6 (ref 5.0–7.5)

## 2021-09-13 MED ORDER — SILODOSIN 8 MG PO CAPS: ORAL_CAPSULE | ORAL | 3 refills | Status: DC

## 2021-09-13 MED ORDER — FINASTERIDE 5 MG PO TABS: 5.0000 mg | ORAL_TABLET | Freq: Every day | ORAL | 3 refills | Status: DC

## 2021-09-13 NOTE — Progress Notes (Signed)
Urological Symptom Review post void residual=32 ML Patient is experiencing the following symptoms: Leakage of urine Stream starts and stops Trouble starting stream Weak stream Erection problems (male only)   Review of Systems  Gastrointestinal (upper)  : Indigestion/heartburn  Gastrointestinal (lower) : Diarrhea  Constitutional : Negative for symptoms  Skin: Negative for skin symptoms  Eyes: Negative for eye symptoms  Ear/Nose/Throat : Negative for Ear/Nose/Throat symptoms  Hematologic/Lymphatic: Negative for Hematologic/Lymphatic symptoms  Cardiovascular : Negative for cardiovascular symptoms  Respiratory : Negative for respiratory symptoms  Endocrine: Negative for endocrine symptoms  Musculoskeletal: Back pain Joint pain  Neurological: Negative for neurological symptoms  Psychologic: Negative for psychiatric symptoms

## 2021-09-13 NOTE — Progress Notes (Signed)
09/13/2021 9:31 AM   Christopher [name] 12-04-1946 [id number]  Referring provider: Celene Squibb, MD [address],  [place] [address]  Followup BPH with urinary hesitancy   HPI: Mr Steib is a 75yo here for followup for BPH with urinary hesitancy and nocturia. IPSS 14 QOL 3. Nocturia 1-2x. He has a strong urinary stream in the morning which worsens throughout the day. He develops urinary hesitancy by afternoon and then has to strain at night to urinate. He is currently on finasteride but has stopped the rapaflo. No dysuria or hematuria. He has a weak urinary stream. No other complaints today.    PMH: Past Medical History:  Diagnosis Date   A-fib [facility])    Arthritis    "all over my body" (06/17/2017)   BPH (benign prostatic hyperplasia)    takes Proscar daily   Chronic back pain    DDD; "upper and lower" (06/17/2017)   Constipation    takes Colace daily   DDD (degenerative disc disease), cervical    DVT (deep venous thrombosis) (HCC)    left peroneal vein 07/2017 (in setting of recent left TKA)   Dysrhythmia    history of Atrial fibrillation, no long has this   Enlarged prostate    GERD (gastroesophageal reflux disease)    takes Nexium daily   H/O hiatal hernia    History of colon polyps    benign   History of gastric ulcer    Insomnia    takes Xanax nightly   Joint capsule tear    left knee   Joint pain    Joint swelling    Macular degeneration    Muscle spasm of back    takes Valium daily as needed   PAF (paroxysmal atrial fibrillation) (HCC)    Peripheral neuropathy    takes Gabapentin daily   PONV (postoperative nausea and vomiting)    Sleep apnea    pt does not wear a cpap   Urinary hesitancy    Weakness    numbness and tingling    Surgical History: Past Surgical History:  Procedure Laterality Date   ANTERIOR CERVICAL DECOMP/DISCECTOMY FUSION  ~ 1991   "took bone from hip"   ANTERIOR CERVICAL DECOMP/DISCECTOMY FUSION  X 2   "put plate in"    ANTERIOR CERVICAL DISCECTOMY  <11/2005    5-6 and 4-5./notes 05/02/2011   APPENDECTOMY     BACK SURGERY     BIOPSY  05/30/2018   Procedure: BIOPSY;  Surgeon: [name], MD;  [facility];  Service: Endoscopy;;  esophagus   CARDIAC CATHETERIZATION     CARPAL TUNNEL RELEASE Bilateral 10/2005 - 11/2005   right-left /notes 05/02/2011   CATARACT EXTRACTION W/PHACO Right 05/24/2014   Procedure: CATARACT EXTRACTION PHACO AND INTRAOCULAR LENS PLACEMENT ([place]);  Surgeon: [name], MD;  Location: [facility];  Service: Ophthalmology;  Laterality: Right;  CDE 6.74   CATARACT EXTRACTION W/PHACO Left 06/17/2014   Procedure: CATARACT EXTRACTION PHACO AND INTRAOCULAR LENS PLACEMENT ([facility]);  Surgeon: [name], MD;  Location: [facility];  Service: Ophthalmology;  Laterality: Left;  CDE:4.65   COLONOSCOPY WITH ESOPHAGOGASTRODUODENOSCOPY (EGD) N/A 01/22/2013   Procedure: COLONOSCOPY WITH ESOPHAGOGASTRODUODENOSCOPY (EGD);  Surgeon: [name], MD;  [facility];  Service: Endoscopy;  Laterality: N/A;  140   COLONOSCOPY WITH PROPOFOL N/A 05/30/2018   Procedure: COLONOSCOPY WITH PROPOFOL;  Surgeon: [name], MD;  [facility];  Service: Endoscopy;  Laterality: N/A;  ESOPHAGOGASTRODUODENOSCOPY (EGD) WITH PROPOFOL N/A 05/30/2018   Procedure: ESOPHAGOGASTRODUODENOSCOPY (EGD) WITH PROPOFOL;  Surgeon: [name], MD;  [facility];  Service: Endoscopy;  Laterality: N/A;   EYE SURGERY Bilateral    "cleaned film w/laser; since my 1st cataract OR"   JOINT REPLACEMENT     LEFT HEART CATH AND CORONARY ANGIOGRAPHY N/A 02/16/2019   Procedure: LEFT HEART CATH AND CORONARY ANGIOGRAPHY;  Surgeon: Belva Crome, MD;  Location: [place] [facility];  Service: Cardiovascular;  Laterality: N/A;   POSTERIOR LUMBAR FUSION     "put a plate in"   QUADRICEPS TENDON REPAIR Left 07/01/2017   QUADRICEPS TENDON REPAIR Left 07/01/2017   Procedure: REPAIR QUADRICEP TENDON;  Surgeon: [name], MD;  Location: [place];  Service: Orthopedics;  Laterality: Left;   SHOULDER ARTHROSCOPY Bilateral    SPINAL CORD STIMULATOR INSERTION N/A 09/23/2015   Procedure: LUMBAR SPINAL CORD STIMULATOR INSERTION;  Surgeon: [name], MD;  Location: [place] [facility];  Service: Neurosurgery;  Laterality: N/A;  LUMBAR SPINAL CORD STIMULATOR INSERTION   TOTAL KNEE ARTHROPLASTY Left 06/17/2017   TOTAL KNEE ARTHROPLASTY Left 06/17/2017   Procedure: TOTAL KNEE ARTHROPLASTY;  Surgeon: [name], MD;  Location: [facility];  Service: Orthopedics;  Laterality: Left;   TOTAL KNEE ARTHROPLASTY Right 12/16/2017   Procedure: TOTAL KNEE ARTHROPLASTY;  Surgeon: [name], MD;  Location: [place];  Service: Orthopedics;  Laterality: Right;    Home Medications:  Allergies as of 09/13/2021       Reactions   Tylenol [acetaminophen]    Stomach pain    Morphine And Related Nausea And Vomiting   Propofol Nausea And Vomiting        Medication List        Accurate as of September 13, 2021  9:31 AM. If you have any questions, ask your nurse or doctor.          alfuzosin 10 MG 24 hr tablet Commonly known as: UROXATRAL TAKE 1 TABLET (10 MG TOTAL) BY MOUTH DAILY WITH BREAKFAST.   ALPRAZolam 1 MG tablet Commonly known as: XANAX Take 1 mg by mouth at bedtime.   amiodarone 200 MG tablet Commonly known as: PACERONE TAKE 1 TABLET BY MOUTH EVERY DAY   apixaban 5 MG Tabs tablet Commonly known as: ELIQUIS Take 1 tablet (5 mg total) by mouth 2 (two) times daily.   aspirin EC 81 MG tablet Take 1 tablet (81 mg total) by mouth daily.   atorvastatin 80 MG tablet Commonly known as: LIPITOR TAKE 1 TABLET BY MOUTH EVERY DAY   B-12 PO Take by mouth daily.   carvedilol 3.125 MG tablet Commonly known as: COREG Take 3.125 mg by mouth daily.   docusate sodium 250 MG capsule Commonly known as: COLACE Take 250 mg by mouth daily.   FIBER-CAPS PO Take 2 capsules by mouth daily.   finasteride 5 MG  tablet Commonly known as: PROSCAR TAKE 1 TABLET BY MOUTH EVERY DAY   furosemide 20 MG tablet Commonly known as: LASIX Take 1 tablet (20 mg total) by mouth daily.   gabapentin 400 MG capsule Commonly known as: NEURONTIN One three times a day What changed:  how much to take how to take this when to take this   HYDROcodone-acetaminophen 10-325 MG tablet Commonly known as: NORCO Take 1 tablet by mouth every 8 (eight) hours as needed for moderate pain.   isosorbide mononitrate 60 MG 24 hr tablet Commonly known as: IMDUR Take 60 mg by  mouth daily.   metoprolol tartrate 25 MG tablet Commonly known as: LOPRESSOR Take 25 mg by mouth 2 (two) times daily.   nitroGLYCERIN 0.4 MG SL tablet Commonly known as: NITROSTAT Place 1 tablet (0.4 mg total) under the tongue every 5 (five) minutes as needed for chest pain.   Omega-3 1000 MG Caps Take 1,000 mg by mouth daily.   pantoprazole 40 MG tablet Commonly known as: PROTONIX TAKE 1 TABLET BY MOUTH TWICE A DAY (30 TO 60 MINUTES BEFORE 1ST AND LAST MEALS OF THE DAY)   polyethylene glycol powder 17 GM/SCOOP powder Commonly known as: GLYCOLAX/MIRALAX Take 8.5 g by mouth daily.   potassium chloride 10 MEQ tablet Commonly known as: KLOR-CON Take by mouth.   silodosin 8 MG Caps capsule Commonly known as: RAPAFLO TAKE 1 CAPSULE BY MOUTH EVERYDAY AT BEDTIME   Simethicone 180 MG Caps Take 1 capsule (180 mg total) by mouth 3 (three) times daily as needed. What changed: when to take this   VITA-C PO Take by mouth.   Vitamin D3 10 MCG (400 UNIT) Caps Take 400 Units by mouth daily.   vitamin E 200 UNIT capsule Take 200 Units by mouth daily.   ZINC 15 PO Take by mouth.        Allergies:  Allergies  Allergen Reactions   Tylenol [Acetaminophen]     Stomach pain    Morphine And Related Nausea And Vomiting   Propofol Nausea And Vomiting    Family History: No family history on file.  Social History:  reports that he quit  smoking about 34 years ago. His smoking use included cigarettes. He has a 30.00 pack-year smoking history. He has quit using smokeless tobacco.  His smokeless tobacco use included chew. He reports current alcohol use of about 15.0 standard drinks per week. He reports that he does not use drugs.  ROS: All other review of systems were reviewed and are negative except what is noted above in HPI  Physical Exam: BP 129/70   Pulse (!) 49   Temp 97.9 F (36.6 C)   Ht 5\' 8"  (1.727 m)   Wt 159 lb (72.1 kg)   BMI 24.18 kg/m   Constitutional:  Alert and oriented, No acute distress. HEENT: [place] AT, moist mucus membranes.  Trachea midline, no masses. Cardiovascular: No clubbing, cyanosis, or edema. Respiratory: Normal respiratory effort, no increased work of breathing. GI: Abdomen is soft, nontender, nondistended, no abdominal masses GU: No CVA tenderness.  Lymph: No cervical or inguinal lymphadenopathy. Skin: No rashes, bruises or suspicious lesions. Neurologic: Grossly intact, no focal deficits, moving all 4 extremities. Psychiatric: Normal mood and affect.  Laboratory Data: Lab Results  Component Value Date   WBC 14.0 (H) 01/04/2021   HGB 12.1 (L) 01/04/2021   HCT 37.0 (L) 01/04/2021   MCV 94.1 01/04/2021   PLT 235 01/04/2021    Lab Results  Component Value Date   CREATININE 0.95 01/04/2021    No results found for: PSA  No results found for: TESTOSTERONE  Lab Results  Component Value Date   HGBA1C 5.6 12/31/2020    Urinalysis    Component Value Date/Time   COLORURINE YELLOW 03/07/2014 2103   APPEARANCEUR Clear 03/15/2021 1450   LABSPEC 1.014 03/07/2014 2103   PHURINE 7.5 03/07/2014 2103   GLUCOSEU Negative 03/15/2021 1450   HGBUR NEGATIVE 03/07/2014 2103   BILIRUBINUR Negative 03/15/2021 1450   KETONESUR NEGATIVE 03/07/2014 2103   PROTEINUR Negative 03/15/2021 [place] 03/07/2014 2103  UROBILINOGEN 0.2 03/07/2014 2103   NITRITE Negative 03/15/2021  1450   NITRITE NEGATIVE 03/07/2014 2103   LEUKOCYTESUR Negative 03/15/2021 1450    Lab Results  Component Value Date   LABMICR Comment 03/15/2021    Pertinent Imaging:  No results found for this or any previous visit.  No results found for this or any previous visit.  No results found for this or any previous visit.  No results found for this or any previous visit.  No results found for this or any previous visit.  No results found for this or any previous visit.  No results found for this or any previous visit.  Results for orders placed during the hospital encounter of 02/21/17  CT RENAL STONE STUDY  Narrative CLINICAL DATA:  Right flank pain for 5 days, history of appendectomy  EXAM: CT ABDOMEN AND PELVIS WITHOUT CONTRAST  TECHNIQUE: Multidetector CT imaging of the abdomen and pelvis was performed following the standard protocol without IV contrast.  COMPARISON:  None.  FINDINGS: Lower chest: Lung bases shows no acute findings. Partially visualized lower thoracic spinal canal stimulator wires  Hepatobiliary: Unenhanced liver shows no biliary ductal dilatation. Question tiny noncalcified gallstones or sludge within dependent gallbladder the largest measures 3 mm.  Pancreas: Unenhanced pancreas with normal appearance.  Spleen: Unenhanced spleen with normal appearance.  Adrenals/Urinary Tract: No adrenal gland mass. Unenhanced kidneys are symmetrical in size. No hydronephrosis or hydroureter. No nephrolithiasis. No calcified ureteral calculi. No calcified calculi are noted within urinary bladder. Mild thickening of urinary bladder wall. Mild cystitis or chronic inflammation cannot be excluded.  Stomach/Bowel: There is no small bowel obstruction. The study is limited without oral or IV contrast. There is some gas in distal small bowel loops without thickening of small bowel wall. Minimal ileus cannot be excluded. There is no small bowel obstruction.  No transition point in caliber of small bowel. No pericecal inflammation. The terminal ileum is unremarkable. The patient is status post appendectomy. Some colonic stool and gas noted within right colon. There is some colonic stool and gas within descending colon and sigmoid colon. No distal colonic obstruction. No definite evidence of colitis or diverticulitis.  Vascular/Lymphatic: Atherosclerotic calcifications of abdominal aorta and iliac arteries. No adenopathy is noted. No aortic aneurysm.  Reproductive: Mild enlarged prostate gland with indentation of urinary bladder base. Prostate gland measures 3.8 by 4.3 cm.  Other: There is a left inguinal scrotal canal hernia containing fat measures 1.6 cm without evidence of acute complication. Small nonspecific bilateral inguinal lymph nodes are noted. The largest left inguinal lymph node measures 6 mm short-axis. The largest right inguinal lymph node measures 7.5 mm short-axis. These are not pathologic by size criteria.  Musculoskeletal: No destructive bony lesions are noted. Sagittal images of the spine shows degenerative changes thoracolumbar spine. Posterior fusion noted lower lumbar spine at L4-L5 level with anatomic alignment.  IMPRESSION: 1. No nephrolithiasis. No hydronephrosis or hydroureter. Question tiny layering sludge or noncalcified gallstones within gallbladder the largest measures 3 mm. 2. No calcified calculi are noted within urinary bladder. Mild thickening of urinary bladder wall. Mild cystitis or chronic inflammation cannot be excluded. Clinical correlation is necessary. 3. Some gas noted within distal small bowel loops without significant small bowel distension. No transition point in caliber of small bowel. Mild ileus cannot be excluded. No definite evidence of small bowel obstruction. 4. No pericecal inflammation. The patient is status post appendectomy. Unremarkable terminal ileum. Moderate stool noted  in cecum and proximal right colon.  5. No calcified ureteral calculi are noted. 6. Mild enlarged prostate gland with indentation of the bladder base. 7. Small nonspecific bilateral inguinal lymph nodes are noted. There is a small left inguinal scrotal canal hernia containing fat measures 1.6 cm no evidence of acute complication.   Electronically Signed By: Lahoma Crocker M.D. On: 02/22/2017 09:02   Assessment & Plan:    1. Benign prostatic hyperplasia with urinary obstruction -continue finasteride and restart rapaflo 8mg  - Urinalysis, Routine w reflex microscopic - BLADDER SCAN AMB NON-IMAGING  2. Hesitancy of micturition -Rapaflo 8mg   3. Nocturia Rapaflo 8mg  qhs   No follow-ups on file.  Nicolette Bang, MD  [facility] [place]

## 2021-09-13 NOTE — Patient Instructions (Signed)
Benign Prostatic Hyperplasia Benign prostatic hyperplasia (BPH) is an enlarged prostate gland that is caused by the normal aging process and not by cancer. The prostate is a walnut-sized gland that is involved in the production of semen. It is located in front of the rectum and below the bladder. The bladder stores urine and the urethra is the tube that carries the urine out of the body. The prostate may get bigger as a man gets older. An enlarged prostate can press on the urethra. This can make it harder to pass urine. The build-up of urine in the bladder can cause infection. Back pressure and infection may progress to bladder damage and kidney (renal) failure. What are the causes? This condition is part of a normal aging process. However, not all men develop problems from this condition. If the prostate enlarges away from the urethra, urine flow will not be blocked. If it enlarges toward the urethra and compresses it, there will be problems passing urine. What increases the risk? This condition is more likely to develop in men over the age of 50 years. What are the signs or symptoms? Symptoms of this condition include: Getting up often during the night to urinate. Needing to urinate frequently during the day. Difficulty starting urine flow. Decrease in size and strength of your urine stream. Leaking (dribbling) after urinating. Inability to pass urine. This needs immediate treatment. Inability to completely empty your bladder. Pain when you pass urine. This is more common if there is also an infection. Urinary tract infection (UTI). How is this diagnosed? This condition is diagnosed based on your medical history, a physical exam, and your symptoms. Tests will also be done, such as: A post-void bladder scan. This measures any amount of urine that may remain in your bladder after you finish urinating. A digital rectal exam. In a rectal exam, your health care provider checks your prostate by  putting a lubricated, gloved finger into your rectum to feel the back of your prostate gland. This exam detects the size of your gland and any abnormal lumps or growths. An exam of your urine (urinalysis). A prostate specific antigen (PSA) screening. This is a blood test used to screen for prostate cancer. An ultrasound. This test uses sound waves to electronically produce a picture of your prostate gland. Your health care provider may refer you to a specialist in kidney and prostate diseases (urologist). How is this treated? Once symptoms begin, your health care provider will monitor your condition (active surveillance or watchful waiting). Treatment for this condition will depend on the severity of your condition. Treatment may include: Observation and yearly exams. This may be the only treatment needed if your condition and symptoms are mild. Medicines to relieve your symptoms, including: Medicines to shrink the prostate. Medicines to relax the muscle of the prostate. Surgery in severe cases. Surgery may include: Prostatectomy. In this procedure, the prostate tissue is removed completely through an open incision or with a laparoscope or robotics. Transurethral resection of the prostate (TURP). In this procedure, a tool is inserted through the opening at the tip of the penis (urethra). It is used to cut away tissue of the inner core of the prostate. The pieces are removed through the same opening of the penis. This removes the blockage. Transurethral incision (TUIP). In this procedure, small cuts are made in the prostate. This lessens the prostate's pressure on the urethra. Transurethral microwave thermotherapy (TUMT). This procedure uses microwaves to create heat. The heat destroys and removes a   small amount of prostate tissue. Transurethral needle ablation (TUNA). This procedure uses radio frequencies to destroy and remove a small amount of prostate tissue. Interstitial laser coagulation (ILC).  This procedure uses a laser to destroy and remove a small amount of prostate tissue. Transurethral electrovaporization (TUVP). This procedure uses electrodes to destroy and remove a small amount of prostate tissue. Prostatic urethral lift. This procedure inserts an implant to push the lobes of the prostate away from the urethra. Follow these instructions at home: Take over-the-counter and prescription medicines only as told by your health care provider. Monitor your symptoms for any changes. Contact your health care provider with any changes. Avoid drinking large amounts of liquid before going to bed or out in public. Avoid or reduce how much caffeine or alcohol you drink. Give yourself time when you urinate. Keep all follow-up visits as told by your health care provider. This is important. Contact a health care provider if: You have unexplained back pain. Your symptoms do not get better with treatment. You develop side effects from the medicine you are taking. Your urine becomes very dark or has a bad smell. Your lower abdomen becomes distended and you have trouble passing your urine. Get help right away if: You have a fever or chills. You suddenly cannot urinate. You feel lightheaded, or very dizzy, or you faint. There are large amounts of blood or clots in the urine. Your urinary problems become hard to manage. You develop moderate to severe low back or flank pain. The flank is the side of your body between the ribs and the hip. These symptoms may represent a serious problem that is an emergency. Do not wait to see if the symptoms will go away. Get medical help right away. Call your local emergency services (911 in the [place] Do not drive yourself to the hospital. Summary Benign prostatic hyperplasia (BPH) is an enlarged prostate that is caused by the normal aging process and not by cancer. An enlarged prostate can press on the urethra. This can make it hard to pass urine. This  condition is part of a normal aging process and is more likely to develop in men over the age of 50 years. Get help right away if you suddenly cannot urinate. This information is not intended to replace advice given to you by your health care provider. Make sure you discuss any questions you have with your health care provider. Document Revised: 03/15/2021 Document Reviewed: 08/11/2020 [employer] Patient Education  2022 [employer]  

## 2021-10-17 DIAGNOSIS — M961 Postlaminectomy syndrome, not elsewhere classified: Secondary | ICD-10-CM | POA: Diagnosis not present

## 2021-10-17 DIAGNOSIS — Z9689 Presence of other specified functional implants: Secondary | ICD-10-CM | POA: Diagnosis not present

## 2021-10-17 DIAGNOSIS — M25561 Pain in right knee: Secondary | ICD-10-CM | POA: Diagnosis not present

## 2021-10-17 DIAGNOSIS — M546 Pain in thoracic spine: Secondary | ICD-10-CM | POA: Diagnosis not present

## 2021-10-17 DIAGNOSIS — M5416 Radiculopathy, lumbar region: Secondary | ICD-10-CM | POA: Diagnosis not present

## 2021-11-20 DIAGNOSIS — M5416 Radiculopathy, lumbar region: Secondary | ICD-10-CM | POA: Diagnosis not present

## 2021-12-05 ENCOUNTER — Ambulatory Visit (HOSPITAL_COMMUNITY)
Admission: RE | Admit: 2021-12-05 | Discharge: 2021-12-05 | Disposition: A | Discharge status: Home / Self Care | Payer: Medicare Other | Source: Ambulatory Visit | Attending: Nurse Practitioner | Admitting: Nurse Practitioner

## 2021-12-05 ENCOUNTER — Other Ambulatory Visit: Payer: Self-pay

## 2021-12-05 ENCOUNTER — Other Ambulatory Visit (HOSPITAL_COMMUNITY): Payer: Self-pay | Admitting: Nurse Practitioner

## 2021-12-05 ENCOUNTER — Encounter (HOSPITAL_COMMUNITY): Payer: Self-pay

## 2021-12-05 DIAGNOSIS — M79671 Pain in right foot: Secondary | ICD-10-CM

## 2021-12-05 DIAGNOSIS — M7989 Other specified soft tissue disorders: Secondary | ICD-10-CM | POA: Diagnosis not present

## 2021-12-08 ENCOUNTER — Other Ambulatory Visit: Payer: Self-pay | Admitting: Interventional Cardiology

## 2021-12-15 DIAGNOSIS — E782 Mixed hyperlipidemia: Secondary | ICD-10-CM | POA: Diagnosis not present

## 2021-12-15 DIAGNOSIS — I10 Essential (primary) hypertension: Secondary | ICD-10-CM | POA: Diagnosis not present

## 2021-12-19 DIAGNOSIS — M546 Pain in thoracic spine: Secondary | ICD-10-CM | POA: Diagnosis not present

## 2021-12-19 DIAGNOSIS — M25561 Pain in right knee: Secondary | ICD-10-CM | POA: Diagnosis not present

## 2021-12-19 DIAGNOSIS — Z9689 Presence of other specified functional implants: Secondary | ICD-10-CM | POA: Diagnosis not present

## 2021-12-19 DIAGNOSIS — M961 Postlaminectomy syndrome, not elsewhere classified: Secondary | ICD-10-CM | POA: Diagnosis not present

## 2021-12-21 DIAGNOSIS — H5203 Hypermetropia, bilateral: Secondary | ICD-10-CM | POA: Diagnosis not present

## 2021-12-29 ENCOUNTER — Other Ambulatory Visit: Payer: Self-pay | Admitting: Neurosurgery

## 2021-12-29 DIAGNOSIS — M542 Cervicalgia: Secondary | ICD-10-CM | POA: Diagnosis not present

## 2021-12-29 DIAGNOSIS — M546 Pain in thoracic spine: Secondary | ICD-10-CM | POA: Diagnosis not present

## 2021-12-29 DIAGNOSIS — G8929 Other chronic pain: Secondary | ICD-10-CM | POA: Diagnosis not present

## 2021-12-29 DIAGNOSIS — M545 Low back pain, unspecified: Secondary | ICD-10-CM | POA: Diagnosis not present

## 2022-01-02 ENCOUNTER — Other Ambulatory Visit: Payer: Self-pay

## 2022-01-02 ENCOUNTER — Encounter (INDEPENDENT_AMBULATORY_CARE_PROVIDER_SITE_OTHER): Payer: Self-pay | Admitting: Gastroenterology

## 2022-01-02 ENCOUNTER — Ambulatory Visit (INDEPENDENT_AMBULATORY_CARE_PROVIDER_SITE_OTHER): Payer: Medicare Other | Admitting: Gastroenterology

## 2022-01-02 VITALS — BP 112/71 | HR 65 | Temp 98.2°F | Ht 68.0 in | Wt 156.8 lb

## 2022-01-02 DIAGNOSIS — R1013 Epigastric pain: Secondary | ICD-10-CM

## 2022-01-02 DIAGNOSIS — R110 Nausea: Secondary | ICD-10-CM | POA: Diagnosis not present

## 2022-01-02 DIAGNOSIS — K5901 Slow transit constipation: Secondary | ICD-10-CM

## 2022-01-02 MED ORDER — SUCRALFATE 1 G PO TABS: 1.0000 g | ORAL_TABLET | Freq: Three times a day (TID) | ORAL | 0 refills | Status: DC

## 2022-01-02 NOTE — H&P (View-Only) (Signed)
Referring Provider: Celene Squibb, MD Primary Care Physician:  Celene Squibb, MD Primary GI Physician: Jenetta Downer  Chief Complaint  Patient presents with   Abdominal Pain    Upper to mid abdominal pain per patient. He states no matter what he eats after eating it, it makes his abdomin hurt. No nausea, no vomiting, but does have some issues with Dysphagia when taking his medications. On pantoprazole 40 mg bid and is stable on this. He thinks he is due for a tcs. He was having issues with constipation,but taking miralax daily is of help.   HPI:   Christopher [name] is a 76 y.o. male with past medical history of GAVE, GERD, constipation, A fib on eliquis, CAD s/p CABG, DDD, DVT.   Patient presenting today for post prandial upper abdominal pain, dysphagia and constipation.   Patient last seen in office 02/13/21. Continued to have constipation at that time, despite 1 capful of miralax per day. He had chronic, recurrent episodes of abdominal cramping x multiple years. He was advised to increase miralax to 1 capful Q8H with considerations of switching to something like linzess if miralax did not help.  Patient reports epigastric pain that is constant, States he does not eat after 2pm in the day usually, unless it is a snack. He reports that eating a snack such as an apple, popsicle, or protein bar he has worsening epigastric pain, however, if he eats a regular meal he does not have epigastric pain as bad. He feels that acid reflux is well controlled on pantoprazole 40mg  BID, occasional breakthrough only if he eats something very acidic/tomato based. He denies any nausea or vomiting. Has some occasional early satiety. He has noticed that lately his medications do not seem to want to go down well. No issues with dysphagia with foods. Denies any diarrhea, states that constipation has improved since he is doing 1 capful of miralax per day, having 1 BM every day to other day. Denies any rectal bleeding or melena.  Takes occasional ibuprofen. States he is not currently drinking alcohol, as he typically does not drink in the winter time. He is not taking hydrocodone anymore, stopped this a few weeks ago. Weight is stable.  Notably, patient has had some elevation in LFTs previously, workup was negative for acute hepatitis, AIH, suspected fatty liver as well as alcohol intake was contributing, patient advised to decrease alcohol intake. Last LFTs 1 year ago were normal. US abdomen complete 08/2020 was without evidence of cirrhosis.   Last EGD:05/30/18 normal esophagus in the proximal and mid areas, with presence of possible short segment barretts esophagus up to 1 cm, (bx consistent with reflux changes, GAVE. Last colonoscopy:05/30/18 diverticulosis and external hemorrhoids  Recommendations:  Repeat colonoscopy 7 years  Past Medical History:  Diagnosis Date   A-fib [facility])    Arthritis    "all over my body" (06/17/2017)   BPH (benign prostatic hyperplasia)    takes Proscar daily   Chronic back pain    DDD; "upper and lower" (06/17/2017)   Constipation    takes Colace daily   DDD (degenerative disc disease), cervical    DVT (deep venous thrombosis) (HCC)    left peroneal vein 07/2017 (in setting of recent left TKA)   Dysrhythmia    history of Atrial fibrillation, no long has this   Enlarged prostate    GERD (gastroesophageal reflux disease)    takes Nexium daily   H/O hiatal hernia    History of colon  polyps    benign   History of gastric ulcer    Insomnia    takes Xanax nightly   Joint capsule tear    left knee   Joint pain    Joint swelling    Macular degeneration    Muscle spasm of back    takes Valium daily as needed   PAF (paroxysmal atrial fibrillation) (HCC)    Peripheral neuropathy    takes Gabapentin daily   PONV (postoperative nausea and vomiting)    Sleep apnea    pt does not wear a cpap   Urinary hesitancy    Weakness    numbness and tingling    Past Surgical History:   Procedure Laterality Date   ANTERIOR CERVICAL DECOMP/DISCECTOMY FUSION  ~ 1991   "took bone from hip"   ANTERIOR CERVICAL DECOMP/DISCECTOMY FUSION  X 2   "put plate in"   ANTERIOR CERVICAL DISCECTOMY  <11/2005    5-6 and 4-5./notes 05/02/2011   APPENDECTOMY     BACK SURGERY     BIOPSY  05/30/2018   Procedure: BIOPSY;  Surgeon: [name], MD;  [facility];  Service: Endoscopy;;  esophagus   CARDIAC CATHETERIZATION     CARPAL TUNNEL RELEASE Bilateral 10/2005 - 11/2005   right-left /notes 05/02/2011   CATARACT EXTRACTION W/PHACO Right 05/24/2014   Procedure: CATARACT EXTRACTION PHACO AND INTRAOCULAR LENS PLACEMENT (Center);  Surgeon: [name], MD;  Location: [facility];  Service: Ophthalmology;  Laterality: Right;  CDE 6.74   CATARACT EXTRACTION W/PHACO Left 06/17/2014   Procedure: CATARACT EXTRACTION PHACO AND INTRAOCULAR LENS PLACEMENT (IOC);  Surgeon: [name], MD;  Location: [facility];  Service: Ophthalmology;  Laterality: Left;  CDE:4.65   COLONOSCOPY WITH ESOPHAGOGASTRODUODENOSCOPY (EGD) N/A 01/22/2013   Procedure: COLONOSCOPY WITH ESOPHAGOGASTRODUODENOSCOPY (EGD);  Surgeon: [name], MD;  [facility];  Service: Endoscopy;  Laterality: N/A;  140   COLONOSCOPY WITH PROPOFOL N/A 05/30/2018   Procedure: COLONOSCOPY WITH PROPOFOL;  Surgeon: [name], MD;  [facility];  Service: Endoscopy;  Laterality: N/A;   ESOPHAGOGASTRODUODENOSCOPY (EGD) WITH PROPOFOL N/A 05/30/2018   Procedure: ESOPHAGOGASTRODUODENOSCOPY (EGD) WITH PROPOFOL;  Surgeon: [name], MD;  [facility];  Service: Endoscopy;  Laterality: N/A;   EYE SURGERY Bilateral    "cleaned film w/laser; since my 1st cataract OR"   JOINT REPLACEMENT     LEFT HEART CATH AND CORONARY ANGIOGRAPHY N/A 02/16/2019   Procedure: LEFT HEART CATH AND CORONARY ANGIOGRAPHY;  Surgeon: Belva Crome, MD;  Location: [place] [facility];  Service: Cardiovascular;  Laterality: N/A;   POSTERIOR  LUMBAR FUSION     "put a plate in"   QUADRICEPS TENDON REPAIR Left 07/01/2017   QUADRICEPS TENDON REPAIR Left 07/01/2017   Procedure: REPAIR QUADRICEP TENDON;  Surgeon: [name], MD;  Location: [place];  Service: Orthopedics;  Laterality: Left;   SHOULDER ARTHROSCOPY Bilateral    SPINAL CORD STIMULATOR INSERTION N/A 09/23/2015   Procedure: LUMBAR SPINAL CORD STIMULATOR INSERTION;  Surgeon: [name], MD;  Location: [place] [facility];  Service: Neurosurgery;  Laterality: N/A;  LUMBAR SPINAL CORD STIMULATOR INSERTION   TOTAL KNEE ARTHROPLASTY Left 06/17/2017   TOTAL KNEE ARTHROPLASTY Left 06/17/2017   Procedure: TOTAL KNEE ARTHROPLASTY;  Surgeon: [name], MD;  Location: [place];  Service: Orthopedics;  Laterality: Left;   TOTAL KNEE ARTHROPLASTY Right 12/16/2017   Procedure: TOTAL KNEE ARTHROPLASTY;  Surgeon: [name], MD;  Location: [place];  Service: Orthopedics;  Laterality: Right;    Current Outpatient Medications  Medication Sig Dispense Refill   alfuzosin (UROXATRAL) 10 MG 24 hr tablet TAKE 1 TABLET (10 MG TOTAL) BY MOUTH DAILY WITH BREAKFAST. 90 tablet 3   ALPRAZolam (XANAX) 1 MG tablet Take 1 mg by mouth at bedtime.  0   amiodarone (PACERONE) 200 MG tablet TAKE 1 TABLET BY MOUTH EVERY DAY 90 tablet 3   apixaban (ELIQUIS) 5 MG TABS tablet Take 1 tablet (5 mg total) by mouth 2 (two) times daily.     Ascorbic Acid (VITA-C PO) Take by mouth.     aspirin EC 81 MG tablet Take 1 tablet (81 mg total) by mouth daily. 90 tablet 3   atorvastatin (LIPITOR) 80 MG tablet TAKE 1 TABLET BY MOUTH EVERY DAY 30 tablet 0   Cholecalciferol (VITAMIN D3) 10 MCG (400 UNIT) CAPS Take 400 Units by mouth daily.     Cyanocobalamin (B-12 PO) Take by mouth daily.     docusate sodium (COLACE) 250 MG capsule Take 250 mg by mouth daily.     finasteride (PROSCAR) 5 MG tablet Take 1 tablet (5 mg total) by mouth daily. 90 tablet 3   furosemide (LASIX) 20 MG tablet Take 1 tablet (20 mg total) by mouth daily. 90 tablet  3   gabapentin (NEURONTIN) 400 MG capsule One three times a day (Patient taking differently: Take 400 mg by mouth 4 (four) times daily. One three times a day)     nitroGLYCERIN (NITROSTAT) 0.4 MG SL tablet Place 1 tablet (0.4 mg total) under the tongue every 5 (five) minutes as needed for chest pain. 25 tablet 5   Omega-3 1000 MG CAPS Take 1,000 mg by mouth daily.     pantoprazole (PROTONIX) 40 MG tablet TAKE 1 TABLET BY MOUTH TWICE A DAY (30 TO 60 MINUTES BEFORE 1ST AND LAST MEALS OF THE DAY) 180 tablet 0   polyethylene glycol powder (GLYCOLAX/MIRALAX) 17 GM/SCOOP powder Take 8.5 g by mouth daily. 255 g 0   silodosin (RAPAFLO) 8 MG CAPS capsule TAKE 1 CAPSULE BY MOUTH EVERYDAY AT BEDTIME 90 capsule 3   Simethicone 180 MG CAPS Take 1 capsule (180 mg total) by mouth 3 (three) times daily as needed. (Patient taking differently: Take 180 mg by mouth daily as needed.)  0   vitamin E 200 UNIT capsule Take 200 Units by mouth daily.     Zinc Sulfate (ZINC 15 PO) Take by mouth.     Calcium Polycarbophil (FIBER-CAPS PO) Take 2 capsules by mouth daily. (Patient not taking: Reported on 01/02/2022)     carvedilol (COREG) 3.125 MG tablet Take 3.125 mg by mouth daily.     HYDROcodone-acetaminophen (NORCO) 10-325 MG tablet Take 1 tablet by mouth every 8 (eight) hours as needed for moderate pain.  (Patient not taking: Reported on 01/02/2022)     isosorbide mononitrate (IMDUR) 60 MG 24 hr tablet Take 60 mg by mouth daily.     metoprolol tartrate (LOPRESSOR) 25 MG tablet Take 25 mg by mouth 2 (two) times daily.     potassium chloride (KLOR-CON) 10 MEQ tablet Take by mouth.     No current facility-administered medications for this visit.    Allergies as of 01/02/2022 - Review Complete 01/02/2022  Allergen Reaction Noted   Tylenol [acetaminophen]  12/21/2019   Morphine and related Nausea And Vomiting 01/06/2013   Propofol Nausea And Vomiting 05/14/2017    History reviewed. No pertinent family history.  Social  History  Socioeconomic History   Marital status: Single    Spouse name: Not on file   Number of children: Not on file   Years of education: Not on file   Highest education level: Not on file  Occupational History   Not on file  Tobacco Use   Smoking status: Former    Packs/day: 1.00    Years: 30.00    Pack years: 30.00    Types: Cigarettes    Quit date: 01/14/1987    Years since quitting: 34.9   Smokeless tobacco: Former    Types: Chew   Tobacco comments:    quit smoking  & chewing by 1987  Vaping Use   Vaping Use: Never used  Substance and Sexual Activity   Alcohol use: Yes    Alcohol/week: 15.0 standard drinks    Types: 3 Glasses of wine, 12 Cans of beer per week    Comment: 2-3 times weekly    Drug use: No   Sexual activity: Not Currently  Other Topics Concern   Not on file  Social History Narrative   Not on file   Social Determinants of Health   Financial Resource Strain: Not on file  Food Insecurity: Not on file  Transportation Needs: Not on file  Physical Activity: Not on file  Stress: Not on file  Social Connections: Not on file   Review of systems General: negative for malaise, night sweats, fever, chills, weight loss Neck: Negative for lumps, goiter, pain and significant neck swelling Resp: Negative for cough, wheezing, dyspnea at rest CV: Negative for chest pain, leg swelling, palpitations, orthopnea GI: denies melena, hematochezia, nausea, vomiting, diarrhea, constipation, odyonophagia, or unintentional weight loss. +epigastric pain +dysphagia of pills +early satiety +bloating MSK: Negative for joint pain or swelling, back pain, and muscle pain. Derm: Negative for itching or rash Psych: Denies depression, anxiety, memory loss, confusion. No homicidal or suicidal ideation.  Heme: Negative for prolonged bleeding, bruising easily, and swollen nodes. Endocrine: Negative for cold or heat intolerance, polyuria, polydipsia and goiter. Neuro: negative for  tremor, gait imbalance, syncope and seizures. The remainder of the review of systems is noncontributory.  Physical Exam: BP 112/71 (BP Location: Left Arm, Patient Position: Sitting, Cuff Size: Large)    Pulse 65    Temp 98.2 F (36.8 C) (Oral)    Ht 5\' 8"  (1.727 m)    Wt 156 lb 12.8 oz (71.1 kg)    BMI 23.84 kg/m  General:   Alert and oriented. No distress noted. Pleasant and cooperative.  Head:  Normocephalic and atraumatic. Eyes:  Conjuctiva clear without scleral icterus. Mouth:  Oral mucosa pink and moist. Good dentition. No lesions. Heart: Normal rate and rhythm, s1 and s2 heart sounds present.  Lungs: Clear lung sounds in all lobes. Respirations equal and unlabored. Abdomen:  +BS, soft, non-distended. TTP of mid epigastric region.  No rebound or guarding. No HSM or masses noted. Derm: No palmar erythema or jaundice Msk:  Symmetrical without gross deformities. Normal posture. Extremities:  Without edema. Neurologic:  Alert and  oriented x4 Psych:  Alert and cooperative. Normal mood and affect.  Invalid input(s): 6 MONTHS   ASSESSMENT: Christopher [name] is a 75 y.o. male presenting today with epigastric pain.  Epigastric pain and bloating has been ongoing for the past few months, worse after eating certain foods. He denies frequent NSAID use and is not currently drinking alcohol. Feels that GERD is well controlled on PPI BID, rare occasions of breakthrough reflux. Has some  dysphagia of pills but none with foods. Hx of GAVE and gastritis, we will proceed with EGD for further evaluation of his symptoms as last EGD was 05/2018, cannot rule out gastritis, PUD, duodenitis. Indications, risks and benefits of procedure discussed in detail with patient. Patient verbalized understanding and is in agreement to proceed with EGD at this time. He should continue with PPI BID, will add carafate 1g tablet AC & HS until EGD can be done. Patient will let me know if he develops any rectal bleeding, melena.    Constipation improved with daily use of miralax 1 capful, continue this with good result.  Patient is on eliquis, will need to be held for appropriate time prior to procedure.  PLAN:  Schedule EGD 2. Continue protonix 40mg  BID 3. Carafate 1g tab, dissolve in 1 oz water, mix and drink AC and HS 4. IB gard for bloating 5. Patient to let me know if he develops new or worsening GI symptoms 6. Continue to avoid NSAIDs and ETOH 7. Continue miralax for constipation  Follow Up: TBD  Kalee Broxton L. Alver Sorrow, MSN, APRN, AGNP-C Adult-Gerontology Nurse Practitioner [facility]

## 2022-01-02 NOTE — Patient Instructions (Addendum)
We will get you scheduled for EGD for further evaluation of your upper abdominal pain. I am sending carafate tablets 1g to your pharmacy, please dissolve 1 tablet in 1 oz of water, mix and drink slurry before meals and at bedtime, this will help to coat esophagus and hopefully provide some pain relief. Please avoid NSAIDs (advil, aleve, naproxen, goody powder, ibuprofen) as these can be very hard on your GI tract, causing inflammation, ulcers and damage to the lining of your GI tract.  Please let me know if you develop nausea, vomiting, rectal bleeding or black stools You can also try over the counter IB gard to help with bloating if you need to.

## 2022-01-02 NOTE — Progress Notes (Signed)
Referring Provider: Celene Squibb, MD Primary Care Physician:  Celene Squibb, MD Primary GI Physician: Jenetta Downer  Chief Complaint  Patient presents with   Abdominal Pain    Upper to mid abdominal pain per patient. He states no matter what he eats after eating it, it makes his abdomin hurt. No nausea, no vomiting, but does have some issues with Dysphagia when taking his medications. On pantoprazole 40 mg bid and is stable on this. He thinks he is due for a tcs. He was having issues with constipation,but taking miralax daily is of help.   HPI:   Christopher [name] is a 76 y.o. male with past medical history of GAVE, GERD, constipation, A fib on eliquis, CAD s/p CABG, DDD, DVT.   Patient presenting today for post prandial upper abdominal pain, dysphagia and constipation.   Patient last seen in office 02/13/21. Continued to have constipation at that time, despite 1 capful of miralax per day. He had chronic, recurrent episodes of abdominal cramping x multiple years. He was advised to increase miralax to 1 capful Q8H with considerations of switching to something like linzess if miralax did not help.  Patient reports epigastric pain that is constant, States he does not eat after 2pm in the day usually, unless it is a snack. He reports that eating a snack such as an apple, popsicle, or protein bar he has worsening epigastric pain, however, if he eats a regular meal he does not have epigastric pain as bad. He feels that acid reflux is well controlled on pantoprazole 40mg  BID, occasional breakthrough only if he eats something very acidic/tomato based. He denies any nausea or vomiting. Has some occasional early satiety. He has noticed that lately his medications do not seem to want to go down well. No issues with dysphagia with foods. Denies any diarrhea, states that constipation has improved since he is doing 1 capful of miralax per day, having 1 BM every day to other day. Denies any rectal bleeding or melena.  Takes occasional ibuprofen. States he is not currently drinking alcohol, as he typically does not drink in the winter time. He is not taking hydrocodone anymore, stopped this a few weeks ago. Weight is stable.  Notably, patient has had some elevation in LFTs previously, workup was negative for acute hepatitis, AIH, suspected fatty liver as well as alcohol intake was contributing, patient advised to decrease alcohol intake. Last LFTs 1 year ago were normal. US abdomen complete 08/2020 was without evidence of cirrhosis.   Last EGD:05/30/18 normal esophagus in the proximal and mid areas, with presence of possible short segment barretts esophagus up to 1 cm, (bx consistent with reflux changes, GAVE. Last colonoscopy:05/30/18 diverticulosis and external hemorrhoids  Recommendations:  Repeat colonoscopy 7 years  Past Medical History:  Diagnosis Date   A-fib [facility])    Arthritis    "all over my body" (06/17/2017)   BPH (benign prostatic hyperplasia)    takes Proscar daily   Chronic back pain    DDD; "upper and lower" (06/17/2017)   Constipation    takes Colace daily   DDD (degenerative disc disease), cervical    DVT (deep venous thrombosis) (HCC)    left peroneal vein 07/2017 (in setting of recent left TKA)   Dysrhythmia    history of Atrial fibrillation, no long has this   Enlarged prostate    GERD (gastroesophageal reflux disease)    takes Nexium daily   H/O hiatal hernia    History of colon  polyps    benign   History of gastric ulcer    Insomnia    takes Xanax nightly   Joint capsule tear    left knee   Joint pain    Joint swelling    Macular degeneration    Muscle spasm of back    takes Valium daily as needed   PAF (paroxysmal atrial fibrillation) (HCC)    Peripheral neuropathy    takes Gabapentin daily   PONV (postoperative nausea and vomiting)    Sleep apnea    pt does not wear a cpap   Urinary hesitancy    Weakness    numbness and tingling    Past Surgical History:   Procedure Laterality Date   ANTERIOR CERVICAL DECOMP/DISCECTOMY FUSION  ~ 1991   "took bone from hip"   ANTERIOR CERVICAL DECOMP/DISCECTOMY FUSION  X 2   "put plate in"   ANTERIOR CERVICAL DISCECTOMY  <11/2005    5-6 and 4-5./notes 05/02/2011   APPENDECTOMY     BACK SURGERY     BIOPSY  05/30/2018   Procedure: BIOPSY;  Surgeon: [name], MD;  [facility];  Service: Endoscopy;;  esophagus   CARDIAC CATHETERIZATION     CARPAL TUNNEL RELEASE Bilateral 10/2005 - 11/2005   right-left /notes 05/02/2011   CATARACT EXTRACTION W/PHACO Right 05/24/2014   Procedure: CATARACT EXTRACTION PHACO AND INTRAOCULAR LENS PLACEMENT ([place]);  Surgeon: [name], MD;  Location: [facility];  Service: Ophthalmology;  Laterality: Right;  CDE 6.74   CATARACT EXTRACTION W/PHACO Left 06/17/2014   Procedure: CATARACT EXTRACTION PHACO AND INTRAOCULAR LENS PLACEMENT ([facility]);  Surgeon: [name], MD;  Location: [facility];  Service: Ophthalmology;  Laterality: Left;  CDE:4.65   COLONOSCOPY WITH ESOPHAGOGASTRODUODENOSCOPY (EGD) N/A 01/22/2013   Procedure: COLONOSCOPY WITH ESOPHAGOGASTRODUODENOSCOPY (EGD);  Surgeon: [name], MD;  [facility];  Service: Endoscopy;  Laterality: N/A;  140   COLONOSCOPY WITH PROPOFOL N/A 05/30/2018   Procedure: COLONOSCOPY WITH PROPOFOL;  Surgeon: [name], MD;  [facility];  Service: Endoscopy;  Laterality: N/A;   ESOPHAGOGASTRODUODENOSCOPY (EGD) WITH PROPOFOL N/A 05/30/2018   Procedure: ESOPHAGOGASTRODUODENOSCOPY (EGD) WITH PROPOFOL;  Surgeon: [name], MD;  [facility];  Service: Endoscopy;  Laterality: N/A;   EYE SURGERY Bilateral    "cleaned film w/laser; since my 1st cataract OR"   JOINT REPLACEMENT     LEFT HEART CATH AND CORONARY ANGIOGRAPHY N/A 02/16/2019   Procedure: LEFT HEART CATH AND CORONARY ANGIOGRAPHY;  Surgeon: Belva Crome, MD;  Location: [place] [facility];  Service: Cardiovascular;  Laterality: N/A;   POSTERIOR  LUMBAR FUSION     "put a plate in"   QUADRICEPS TENDON REPAIR Left 07/01/2017   QUADRICEPS TENDON REPAIR Left 07/01/2017   Procedure: REPAIR QUADRICEP TENDON;  Surgeon: [name], MD;  Location: [place];  Service: Orthopedics;  Laterality: Left;   SHOULDER ARTHROSCOPY Bilateral    SPINAL CORD STIMULATOR INSERTION N/A 09/23/2015   Procedure: LUMBAR SPINAL CORD STIMULATOR INSERTION;  Surgeon: [name], MD;  Location: [place] [facility];  Service: Neurosurgery;  Laterality: N/A;  LUMBAR SPINAL CORD STIMULATOR INSERTION   TOTAL KNEE ARTHROPLASTY Left 06/17/2017   TOTAL KNEE ARTHROPLASTY Left 06/17/2017   Procedure: TOTAL KNEE ARTHROPLASTY;  Surgeon: [name], MD;  Location: [place];  Service: Orthopedics;  Laterality: Left;   TOTAL KNEE ARTHROPLASTY Right 12/16/2017   Procedure: TOTAL KNEE ARTHROPLASTY;  Surgeon: [name], MD;  Location: [place];  Service: Orthopedics;  Laterality: Right;    Current Outpatient Medications  Medication Sig Dispense Refill   alfuzosin (UROXATRAL) 10 MG 24 hr tablet TAKE 1 TABLET (10 MG TOTAL) BY MOUTH DAILY WITH BREAKFAST. 90 tablet 3   ALPRAZolam (XANAX) 1 MG tablet Take 1 mg by mouth at bedtime.  0   amiodarone (PACERONE) 200 MG tablet TAKE 1 TABLET BY MOUTH EVERY DAY 90 tablet 3   apixaban (ELIQUIS) 5 MG TABS tablet Take 1 tablet (5 mg total) by mouth 2 (two) times daily.     Ascorbic Acid (VITA-C PO) Take by mouth.     aspirin EC 81 MG tablet Take 1 tablet (81 mg total) by mouth daily. 90 tablet 3   atorvastatin (LIPITOR) 80 MG tablet TAKE 1 TABLET BY MOUTH EVERY DAY 30 tablet 0   Cholecalciferol (VITAMIN D3) 10 MCG (400 UNIT) CAPS Take 400 Units by mouth daily.     Cyanocobalamin (B-12 PO) Take by mouth daily.     docusate sodium (COLACE) 250 MG capsule Take 250 mg by mouth daily.     finasteride (PROSCAR) 5 MG tablet Take 1 tablet (5 mg total) by mouth daily. 90 tablet 3   furosemide (LASIX) 20 MG tablet Take 1 tablet (20 mg total) by mouth daily. 90 tablet  3   gabapentin (NEURONTIN) 400 MG capsule One three times a day (Patient taking differently: Take 400 mg by mouth 4 (four) times daily. One three times a day)     nitroGLYCERIN (NITROSTAT) 0.4 MG SL tablet Place 1 tablet (0.4 mg total) under the tongue every 5 (five) minutes as needed for chest pain. 25 tablet 5   Omega-3 1000 MG CAPS Take 1,000 mg by mouth daily.     pantoprazole (PROTONIX) 40 MG tablet TAKE 1 TABLET BY MOUTH TWICE A DAY (30 TO 60 MINUTES BEFORE 1ST AND LAST MEALS OF THE DAY) 180 tablet 0   polyethylene glycol powder (GLYCOLAX/MIRALAX) 17 GM/SCOOP powder Take 8.5 g by mouth daily. 255 g 0   silodosin (RAPAFLO) 8 MG CAPS capsule TAKE 1 CAPSULE BY MOUTH EVERYDAY AT BEDTIME 90 capsule 3   Simethicone 180 MG CAPS Take 1 capsule (180 mg total) by mouth 3 (three) times daily as needed. (Patient taking differently: Take 180 mg by mouth daily as needed.)  0   vitamin E 200 UNIT capsule Take 200 Units by mouth daily.     Zinc Sulfate (ZINC 15 PO) Take by mouth.     Calcium Polycarbophil (FIBER-CAPS PO) Take 2 capsules by mouth daily. (Patient not taking: Reported on 01/02/2022)     carvedilol (COREG) 3.125 MG tablet Take 3.125 mg by mouth daily.     HYDROcodone-acetaminophen (NORCO) 10-325 MG tablet Take 1 tablet by mouth every 8 (eight) hours as needed for moderate pain.  (Patient not taking: Reported on 01/02/2022)     isosorbide mononitrate (IMDUR) 60 MG 24 hr tablet Take 60 mg by mouth daily.     metoprolol tartrate (LOPRESSOR) 25 MG tablet Take 25 mg by mouth 2 (two) times daily.     potassium chloride (KLOR-CON) 10 MEQ tablet Take by mouth.     No current facility-administered medications for this visit.    Allergies as of 01/02/2022 - Review Complete 01/02/2022  Allergen Reaction Noted   Tylenol [acetaminophen]  12/21/2019   Morphine and related Nausea And Vomiting 01/06/2013   Propofol Nausea And Vomiting 05/14/2017    History reviewed. No pertinent family history.  Social  History  Socioeconomic History   Marital status: Single    Spouse name: Not on file   Number of children: Not on file   Years of education: Not on file   Highest education level: Not on file  Occupational History   Not on file  Tobacco Use   Smoking status: Former    Packs/day: 1.00    Years: 30.00    Pack years: 30.00    Types: Cigarettes    Quit date: 01/14/1987    Years since quitting: 34.9   Smokeless tobacco: Former    Types: Chew   Tobacco comments:    quit smoking  & chewing by 1987  Vaping Use   Vaping Use: Never used  Substance and Sexual Activity   Alcohol use: Yes    Alcohol/week: 15.0 standard drinks    Types: 3 Glasses of wine, 12 Cans of beer per week    Comment: 2-3 times weekly    Drug use: No   Sexual activity: Not Currently  Other Topics Concern   Not on file  Social History Narrative   Not on file   Social Determinants of Health   Financial Resource Strain: Not on file  Food Insecurity: Not on file  Transportation Needs: Not on file  Physical Activity: Not on file  Stress: Not on file  Social Connections: Not on file   Review of systems General: negative for malaise, night sweats, fever, chills, weight loss Neck: Negative for lumps, goiter, pain and significant neck swelling Resp: Negative for cough, wheezing, dyspnea at rest CV: Negative for chest pain, leg swelling, palpitations, orthopnea GI: denies melena, hematochezia, nausea, vomiting, diarrhea, constipation, odyonophagia, or unintentional weight loss. +epigastric pain +dysphagia of pills +early satiety +bloating MSK: Negative for joint pain or swelling, back pain, and muscle pain. Derm: Negative for itching or rash Psych: Denies depression, anxiety, memory loss, confusion. No homicidal or suicidal ideation.  Heme: Negative for prolonged bleeding, bruising easily, and swollen nodes. Endocrine: Negative for cold or heat intolerance, polyuria, polydipsia and goiter. Neuro: negative for  tremor, gait imbalance, syncope and seizures. The remainder of the review of systems is noncontributory.  Physical Exam: BP 112/71 (BP Location: Left Arm, Patient Position: Sitting, Cuff Size: Large)    Pulse 65    Temp 98.2 F (36.8 C) (Oral)    Ht 5\' 8"  (1.727 m)    Wt 156 lb 12.8 oz (71.1 kg)    BMI 23.84 kg/m  General:   Alert and oriented. No distress noted. Pleasant and cooperative.  Head:  Normocephalic and atraumatic. Eyes:  Conjuctiva clear without scleral icterus. Mouth:  Oral mucosa pink and moist. Good dentition. No lesions. Heart: Normal rate and rhythm, s1 and s2 heart sounds present.  Lungs: Clear lung sounds in all lobes. Respirations equal and unlabored. Abdomen:  +BS, soft, non-distended. TTP of mid epigastric region.  No rebound or guarding. No HSM or masses noted. Derm: No palmar erythema or jaundice Msk:  Symmetrical without gross deformities. Normal posture. Extremities:  Without edema. Neurologic:  Alert and  oriented x4 Psych:  Alert and cooperative. Normal mood and affect.  Invalid input(s): 6 MONTHS   ASSESSMENT: Christopher [name] is a 76 y.o. male presenting today with epigastric pain.  Epigastric pain and bloating has been ongoing for the past few months, worse after eating certain foods. He denies frequent NSAID use and is not currently drinking alcohol. Feels that GERD is well controlled on PPI BID, rare occasions of breakthrough reflux. Has some  dysphagia of pills but none with foods. Hx of GAVE and gastritis, we will proceed with EGD for further evaluation of his symptoms as last EGD was 05/2018, cannot rule out gastritis, PUD, duodenitis. Indications, risks and benefits of procedure discussed in detail with patient. Patient verbalized understanding and is in agreement to proceed with EGD at this time. He should continue with PPI BID, will add carafate 1g tablet AC & HS until EGD can be done. Patient will let me know if he develops any rectal bleeding, melena.    Constipation improved with daily use of miralax 1 capful, continue this with good result.  Patient is on eliquis, will need to be held for appropriate time prior to procedure.  PLAN:  Schedule EGD 2. Continue protonix 40mg  BID 3. Carafate 1g tab, dissolve in 1 oz water, mix and drink AC and HS 4. IB gard for bloating 5. Patient to let me know if he develops new or worsening GI symptoms 6. Continue to avoid NSAIDs and ETOH 7. Continue miralax for constipation  Follow Up: TBD  Aldean Suddeth L. Alver Sorrow, MSN, APRN, AGNP-C Adult-Gerontology Nurse Practitioner [facility]

## 2022-01-05 ENCOUNTER — Telehealth: Payer: Self-pay | Admitting: *Deleted

## 2022-01-05 ENCOUNTER — Telehealth: Payer: Self-pay | Admitting: Cardiovascular Disease

## 2022-01-05 ENCOUNTER — Encounter (INDEPENDENT_AMBULATORY_CARE_PROVIDER_SITE_OTHER): Payer: Self-pay

## 2022-01-05 ENCOUNTER — Other Ambulatory Visit (INDEPENDENT_AMBULATORY_CARE_PROVIDER_SITE_OTHER): Payer: Self-pay

## 2022-01-05 ENCOUNTER — Other Ambulatory Visit: Payer: Self-pay

## 2022-01-05 ENCOUNTER — Telehealth: Payer: Self-pay

## 2022-01-05 DIAGNOSIS — Z79899 Other long term (current) drug therapy: Secondary | ICD-10-CM

## 2022-01-05 DIAGNOSIS — G8929 Other chronic pain: Secondary | ICD-10-CM

## 2022-01-05 DIAGNOSIS — R140 Abdominal distension (gaseous): Secondary | ICD-10-CM

## 2022-01-05 NOTE — Telephone Encounter (Signed)
Patient with diagnosis of A Fib on Eliquis for anticoagulation.    Procedure:  UPPER ENDOSCOPY Date of procedure: 01/09/22   CHA2DS2-VASc Score = 3  his indicates a 3.2% annual risk of stroke. The patient's score is based upon: CHF History: 0 HTN History: 0 Diabetes History: 0 Stroke History: 0 Vascular Disease History: 1 Age Score: 2 Gender Score: 0   CrCl 66 mL/min Platelet count 261K  Per office protocol, patient can hold Eliquis for 2 days prior to procedure.

## 2022-01-05 NOTE — Telephone Encounter (Signed)
° °  Pre-operative Risk Assessment    Patient Name: Christopher [name]  DOB: 1946/04/29 MRN: [id number]      Request for Surgical Clearance    Procedure:   UPPER ENDOSCOPY  Date of Surgery:  Clearance 01/09/22                                 Surgeon:  DR. DANIEL CASTANEDA Surgeon's Group or Practice Name:  [facility] Phone number:  [phone] Fax number:  [phone]   Type of Clearance Requested:   - Medical  - Pharmacy:  Hold Apixaban (Eliquis) X'S 2 DAYS   Type of Anesthesia:  MAC   Additional requests/questions:      Astrid Divine   01/05/2022, 9:17 AM

## 2022-01-05 NOTE — Telephone Encounter (Signed)
Pt c/o medication issue:  1. Name of Medication:    2. How are you currently taking this medication (dosage and times per day)?    3. Are you having a reaction (difficulty breathing--STAT)? none  4. What is your medication issue? Patients states he having issues getting the medication but unsure of which medication it is. Please advise

## 2022-01-05 NOTE — Telephone Encounter (Signed)
Reviewed chart with patient - per last OV note. Patient to take finasteride and silodosin. Patient informed his is not taking to alfuzosin. Patient voiced understanding.

## 2022-01-05 NOTE — Telephone Encounter (Signed)
°  Prescribed  by Dr. [name] silodosin (RAPAFLO) 8 MG CAPS capsule  Patient was told by another doctor, he doesn't need to be taking these 2 medications together.  They are the same.  finasteride (PROSCAR) 5 MG tablet alfuzosin (UROXATRAL) 10 MG 24 hr tablet  Please advise.  Call back: [phone]  Thanks, Helene Kelp

## 2022-01-05 NOTE — Patient Instructions (Signed)
JAMAUL HEIST  01/05/2022     @PREFPERIOPPHARMACY @   Your procedure is scheduled on  01/09/2022.   Report to [facility] at  1300  (1:00) P.M.   Call this number if you have problems the morning of surgery:  [phone]   Remember:  Follow the diet instructions given to you by the office.      Your last dose of eliquis should be on 01/06/2022.   Take these medicines the morning of surgery with A SIP OF WATER        uroxatrol, amiodarone, carvedilol, proscar, gabapentin, imdur, metoprolol, protonix.     Do not wear jewelry, make-up or nail polish.  Do not wear lotions, powders, or perfumes, or deodorant.  Do not shave 48 hours prior to surgery.  Men may shave face and neck.  Do not bring valuables to the hospital.  [facility] is not responsible for any belongings or valuables.  Contacts, dentures or bridgework may not be worn into surgery.  Leave your suitcase in the car.  After surgery it may be brought to your room.  For patients admitted to the hospital, discharge time will be determined by your treatment team.  Patients discharged the day of surgery will not be allowed to drive home and must have someone with them for 24 hours.    Special instructions:   DO NOT smoke tobacco or vape for 24 hours before your procedure.  Please read over the following fact sheets that you were given. Anesthesia Post-op Instructions and Care and Recovery After Surgery      Upper Endoscopy, Adult, Care After This sheet gives you information about how to care for yourself after your procedure. Your health care provider may also give you more specific instructions. If you have problems or questions, contact your health care provider. What can I expect after the procedure? After the procedure, it is common to have: A sore throat. Mild stomach pain or discomfort. Bloating. Nausea. Follow these instructions at home:  Follow instructions from your health care provider about  what to eat or drink after your procedure. Return to your normal activities as told by your health care provider. Ask your health care provider what activities are safe for you. Take over-the-counter and prescription medicines only as told by your health care provider. If you were given a sedative during the procedure, it can affect you for several hours. Do not drive or operate machinery until your health care provider says that it is safe. Keep all follow-up visits as told by your health care provider. This is important. Contact a health care provider if you have: A sore throat that lasts longer than one day. Trouble swallowing. Get help right away if: You vomit blood or your vomit looks like coffee grounds. You have: A fever. Bloody, black, or tarry stools. A severe sore throat or you cannot swallow. Difficulty breathing. Severe pain in your chest or abdomen. Summary After the procedure, it is common to have a sore throat, mild stomach discomfort, bloating, and nausea. If you were given a sedative during the procedure, it can affect you for several hours. Do not drive or operate machinery until your health care provider says that it is safe. Follow instructions from your health care provider about what to eat or drink after your procedure. Return to your normal activities as told by your health care provider. This information is not intended to replace advice given to you by your  health care provider. Make sure you discuss any questions you have with your health care provider. Document Revised: 10/09/2019 Document Reviewed: 05/05/2018 Elsevier Patient Education  2022 [place] After This sheet gives you information about how to care for yourself after your procedure. Your health care provider may also give you more specific instructions. If you have problems or questions, contact your health care provider. What can I expect after the procedure? After  the procedure, it is common to have: Tiredness. Forgetfulness about what happened after the procedure. Impaired judgment for important decisions. Nausea or vomiting. Some difficulty with balance. Follow these instructions at home: For the time period you were told by your health care provider:   Rest as needed. Do not participate in activities where you could fall or become injured. Do not drive or use machinery. Do not drink alcohol. Do not take sleeping pills or medicines that cause drowsiness. Do not make important decisions or sign legal documents. Do not take care of children on your own. Eating and drinking Follow the diet that is recommended by your health care provider. Drink enough fluid to keep your urine pale yellow. If you vomit: Drink water, juice, or soup when you can drink without vomiting. Make sure you have little or no nausea before eating solid foods. General instructions Have a responsible adult stay with you for the time you are told. It is important to have someone help care for you until you are awake and alert. Take over-the-counter and prescription medicines only as told by your health care provider. If you have sleep apnea, surgery and certain medicines can increase your risk for breathing problems. Follow instructions from your health care provider about wearing your sleep device: Anytime you are sleeping, including during daytime naps. While taking prescription pain medicines, sleeping medicines, or medicines that make you drowsy. Avoid smoking. Keep all follow-up visits as told by your health care provider. This is important. Contact a health care provider if: You keep feeling nauseous or you keep vomiting. You feel light-headed. You are still sleepy or having trouble with balance after 24 hours. You develop a rash. You have a fever. You have redness or swelling around the IV site. Get help right away if: You have trouble breathing. You have  new-onset confusion at home. Summary For several hours after your procedure, you may feel tired. You may also be forgetful and have poor judgment. Have a responsible adult stay with you for the time you are told. It is important to have someone help care for you until you are awake and alert. Rest as told. Do not drive or operate machinery. Do not drink alcohol or take sleeping pills. Get help right away if you have trouble breathing, or if you suddenly become confused. This information is not intended to replace advice given to you by your health care provider. Make sure you discuss any questions you have with your health care provider. Document Revised: 08/18/2020 Document Reviewed: 11/05/2019 Elsevier Patient Education  2022 [employer].

## 2022-01-05 NOTE — Telephone Encounter (Signed)
° °  Primary Cardiologist: Jenkins Rouge, MD  Chart reviewed as part of pre-operative protocol coverage. Given past medical history and time since last visit, based on ACC/AHA guidelines, Christopher [name] would be at acceptable risk for the planned procedure without further cardiovascular testing.   He is able to complete greater than 4 METS of physical activity.  Patient with diagnosis of A Fib on Eliquis for anticoagulation.     Procedure:  UPPER ENDOSCOPY Date of procedure: 01/09/22     CHA2DS2-VASc Score = 3  his indicates a 3.2% annual risk of stroke. The patient's score is based upon: CHF History: 0 HTN History: 0 Diabetes History: 0 Stroke History: 0 Vascular Disease History: 1 Age Score: 2 Gender Score: 0     CrCl 66 mL/min Platelet count 261K   Per office protocol, patient can hold Eliquis for 2 days prior to procedure.   Patient was advised that if he develops new symptoms prior to surgery to contact our office to arrange a follow-up appointment.  He verbalized understanding.  I will route this recommendation to the requesting party via Epic fax function and remove from pre-op pool.  Please call with questions.  Jossie Ng. Mckell Riecke NP-C    01/05/2022, [address] Office [phone] Fax [phone]

## 2022-01-05 NOTE — Telephone Encounter (Signed)
Spoke with pt and there is some confusion with medications After speaking with pt appears pt has not been taking Amiodarone 200 mg every day and will check with pharmacy to see if there is a refill available After reviewing pt's records Pt had PFT done 10/28/20 and was instructed at that time to stop Amiodarone. Called pt back  and pt reviewed meds again and has been taking the Amiodarone . WIll forward to Dr Lovena Le for review and recommendations if pt should be taking Amiodarone or not./cy

## 2022-01-08 ENCOUNTER — Encounter (HOSPITAL_COMMUNITY)
Admission: RE | Admit: 2022-01-08 | Discharge: 2022-01-08 | Disposition: A | Discharge status: Home / Self Care | Payer: Medicare Other | Source: Ambulatory Visit | Attending: Gastroenterology | Admitting: Gastroenterology

## 2022-01-08 ENCOUNTER — Other Ambulatory Visit (INDEPENDENT_AMBULATORY_CARE_PROVIDER_SITE_OTHER): Payer: Self-pay

## 2022-01-08 ENCOUNTER — Ambulatory Visit
Admission: RE | Admit: 2022-01-08 | Discharge: 2022-01-08 | Disposition: A | Discharge status: Home / Self Care | Payer: Medicare Other | Source: Ambulatory Visit | Attending: Neurosurgery | Admitting: Neurosurgery

## 2022-01-08 ENCOUNTER — Other Ambulatory Visit: Payer: Self-pay

## 2022-01-08 ENCOUNTER — Encounter (HOSPITAL_COMMUNITY): Payer: Self-pay

## 2022-01-08 DIAGNOSIS — M5134 Other intervertebral disc degeneration, thoracic region: Secondary | ICD-10-CM | POA: Diagnosis not present

## 2022-01-08 DIAGNOSIS — M546 Pain in thoracic spine: Secondary | ICD-10-CM

## 2022-01-08 DIAGNOSIS — G8929 Other chronic pain: Secondary | ICD-10-CM | POA: Diagnosis not present

## 2022-01-08 DIAGNOSIS — Z01818 Encounter for other preprocedural examination: Secondary | ICD-10-CM | POA: Diagnosis not present

## 2022-01-08 DIAGNOSIS — R140 Abdominal distension (gaseous): Secondary | ICD-10-CM | POA: Diagnosis not present

## 2022-01-08 DIAGNOSIS — M545 Low back pain, unspecified: Secondary | ICD-10-CM

## 2022-01-08 DIAGNOSIS — M4326 Fusion of spine, lumbar region: Secondary | ICD-10-CM | POA: Diagnosis not present

## 2022-01-08 DIAGNOSIS — M542 Cervicalgia: Secondary | ICD-10-CM

## 2022-01-08 DIAGNOSIS — R1013 Epigastric pain: Secondary | ICD-10-CM | POA: Insufficient documentation

## 2022-01-08 DIAGNOSIS — M4322 Fusion of spine, cervical region: Secondary | ICD-10-CM | POA: Diagnosis not present

## 2022-01-08 LAB — BASIC METABOLIC PANEL
Anion gap: 9 (ref 5–15)
BUN: 17 mg/dL (ref 8–23)
CO2: 24 mmol/L (ref 22–32)
Calcium: 8.9 mg/dL (ref 8.9–10.3)
Chloride: 106 mmol/L (ref 98–111)
Creatinine, Ser: 0.89 mg/dL (ref 0.61–1.24)
GFR, Estimated: >60 mL/min (ref >60)
Glucose, Bld: 135 mg/dL — ABNORMAL HIGH (ref 70–99)
Potassium: 3.8 mmol/L (ref 3.5–5.1)
Sodium: 139 mmol/L (ref 135–145)

## 2022-01-08 MED ORDER — IOPAMIDOL (ISOVUE-M 300) INJECTION 61%
10.0000 mL | Freq: Once | INTRAMUSCULAR | Status: AC | PRN
  Administered 2022-01-08: 10 mL via INTRATHECAL

## 2022-01-08 MED ORDER — MEPERIDINE HCL 50 MG/ML IJ SOLN: 50.0000 mg | Freq: Once | INTRAMUSCULAR | Status: DC | PRN

## 2022-01-08 MED ORDER — ONDANSETRON HCL 4 MG/2ML IJ SOLN: 4.0000 mg | Freq: Once | INTRAMUSCULAR | Status: DC | PRN

## 2022-01-08 MED ORDER — DIAZEPAM 5 MG PO TABS
5.0000 mg | ORAL_TABLET | Freq: Once | ORAL | Status: AC
  Administered 2022-01-08: 5 mg via ORAL

## 2022-01-08 NOTE — Discharge Instructions (Signed)

## 2022-01-09 ENCOUNTER — Ambulatory Visit (HOSPITAL_COMMUNITY): Payer: Medicare Other | Admitting: Anesthesiology

## 2022-01-09 ENCOUNTER — Encounter (HOSPITAL_COMMUNITY)
Admission: RE | Disposition: A | Discharge status: Home / Self Care | Payer: Self-pay | Source: Home / Self Care | Attending: Gastroenterology

## 2022-01-09 ENCOUNTER — Other Ambulatory Visit (HOSPITAL_COMMUNITY)
Admission: RE | Admit: 2022-01-09 | Discharge: 2022-01-09 | Disposition: A | Discharge status: Home / Self Care | Payer: Medicare Other | Source: Ambulatory Visit | Attending: Cardiovascular Disease | Admitting: Cardiovascular Disease

## 2022-01-09 ENCOUNTER — Encounter (HOSPITAL_COMMUNITY): Payer: Self-pay | Admitting: Gastroenterology

## 2022-01-09 ENCOUNTER — Ambulatory Visit (HOSPITAL_COMMUNITY)
Admission: RE | Admit: 2022-01-09 | Discharge: 2022-01-09 | Disposition: A | Discharge status: Home / Self Care | Payer: Medicare Other | Attending: Gastroenterology | Admitting: Gastroenterology

## 2022-01-09 DIAGNOSIS — K31A19 Gastric intestinal metaplasia without dysplasia, unspecified site: Secondary | ICD-10-CM | POA: Diagnosis not present

## 2022-01-09 DIAGNOSIS — K319 Disease of stomach and duodenum, unspecified: Secondary | ICD-10-CM | POA: Insufficient documentation

## 2022-01-09 DIAGNOSIS — Z79899 Other long term (current) drug therapy: Secondary | ICD-10-CM | POA: Insufficient documentation

## 2022-01-09 DIAGNOSIS — Z7901 Long term (current) use of anticoagulants: Secondary | ICD-10-CM | POA: Diagnosis not present

## 2022-01-09 DIAGNOSIS — G8929 Other chronic pain: Secondary | ICD-10-CM

## 2022-01-09 DIAGNOSIS — R140 Abdominal distension (gaseous): Secondary | ICD-10-CM | POA: Insufficient documentation

## 2022-01-09 DIAGNOSIS — Z87891 Personal history of nicotine dependence: Secondary | ICD-10-CM | POA: Insufficient documentation

## 2022-01-09 DIAGNOSIS — I2510 Atherosclerotic heart disease of native coronary artery without angina pectoris: Secondary | ICD-10-CM | POA: Insufficient documentation

## 2022-01-09 DIAGNOSIS — K219 Gastro-esophageal reflux disease without esophagitis: Secondary | ICD-10-CM | POA: Insufficient documentation

## 2022-01-09 DIAGNOSIS — Z86718 Personal history of other venous thrombosis and embolism: Secondary | ICD-10-CM | POA: Insufficient documentation

## 2022-01-09 DIAGNOSIS — I480 Paroxysmal atrial fibrillation: Secondary | ICD-10-CM | POA: Diagnosis not present

## 2022-01-09 DIAGNOSIS — G4730 Sleep apnea, unspecified: Secondary | ICD-10-CM | POA: Diagnosis not present

## 2022-01-09 DIAGNOSIS — R1013 Epigastric pain: Secondary | ICD-10-CM | POA: Insufficient documentation

## 2022-01-09 DIAGNOSIS — Z951 Presence of aortocoronary bypass graft: Secondary | ICD-10-CM | POA: Insufficient documentation

## 2022-01-09 DIAGNOSIS — I25119 Atherosclerotic heart disease of native coronary artery with unspecified angina pectoris: Secondary | ICD-10-CM | POA: Diagnosis not present

## 2022-01-09 HISTORY — PX: BIOPSY: SHX5522

## 2022-01-09 HISTORY — PX: ESOPHAGOGASTRODUODENOSCOPY (EGD) WITH PROPOFOL: SHX5813

## 2022-01-09 LAB — TSH: TSH: 2.01 u[IU]/mL (ref 0.350–4.500)

## 2022-01-09 LAB — HEPATIC FUNCTION PANEL
ALT: 44 U/L (ref 0–44)
AST: 40 U/L (ref 15–41)
Albumin: 4.2 g/dL (ref 3.5–5.0)
Alkaline Phosphatase: 73 U/L (ref 38–126)
Bilirubin, Direct: 0.1 mg/dL (ref 0.0–0.2)
Indirect Bilirubin: 0.6 mg/dL (ref 0.3–0.9)
Total Bilirubin: 0.7 mg/dL (ref 0.3–1.2)
Total Protein: 7.1 g/dL (ref 6.5–8.1)

## 2022-01-09 LAB — SEDIMENTATION RATE: Sed Rate: 15 mm/hr (ref 0–16)

## 2022-01-09 SURGERY — ESOPHAGOGASTRODUODENOSCOPY (EGD) WITH PROPOFOL
Anesthesia: General

## 2022-01-09 MED ORDER — PROPOFOL 10 MG/ML IV BOLUS
INTRAVENOUS | Status: DC | PRN
Start: 2022-01-09 — End: 2022-01-09
  Administered 2022-01-09: 120 mg via INTRAVENOUS

## 2022-01-09 MED ORDER — LACTATED RINGERS IV SOLN: INTRAVENOUS | Status: DC

## 2022-01-09 MED ORDER — LIDOCAINE HCL (CARDIAC) PF 50 MG/5ML IV SOSY
PREFILLED_SYRINGE | INTRAVENOUS | Status: DC | PRN
  Administered 2022-01-09: 50 mg via INTRAVENOUS

## 2022-01-09 NOTE — Telephone Encounter (Signed)
I spoke with patient and he will continue Amiodarone for now. He will have lab work done today and he awaits appointment for PFT's.

## 2022-01-09 NOTE — Op Note (Addendum)
[facility] Patient Name: Christopher [name] Procedure Date: 01/09/2022 2:51 PM MRN: [id number] Date of Birth: Jan 29, 1946 Attending MD: Maylon Peppers ,  CSN: [id number] Age: 76 Admit Type: Outpatient Procedure:                Upper GI endoscopy Indications:              Epigastric abdominal pain, Abdominal bloating Providers:                Maylon Peppers, Hughie Closs, RN, Charlsie Quest.                            Theda Sers RN, RN, Randa Spike, Technician Referring MD:              Medicines:                Monitored Anesthesia Care Complications:            No immediate complications. Estimated Blood Loss:     Estimated blood loss: none. Procedure:                Pre-Anesthesia Assessment:                           - Prior to the procedure, a History and Physical                            was performed, and patient medications, allergies                            and sensitivities were reviewed. The patient's                            tolerance of previous anesthesia was reviewed.                           - The risks and benefits of the procedure and the                            sedation options and risks were discussed with the                            patient. All questions were answered and informed                            consent was obtained.                           After obtaining informed consent, the endoscope was                            passed under direct vision. Throughout the                            procedure, the patient's blood pressure, pulse, and                            oxygen  saturations were monitored continuously. The                            [id number]-H190 ([id number]) scope was introduced through the                            mouth, and advanced to the second part of duodenum.                            The upper GI endoscopy was accomplished without                            difficulty. The patient tolerated the procedure                             well. Scope In: 3:08:44 PM Scope Out: 3:14:14 PM Total Procedure Duration: 0 hours 5 minutes 30 seconds  Findings:      The examined esophagus was normal.      The entire examined stomach was normal. Biopsies were taken with a cold       forceps for Helicobacter pylori testing.      The examined duodenum was normal. Biopsies were taken with a cold       forceps for histology. Impression:               - Normal esophagus.                           - Normal stomach. Biopsied.                           - Normal examined duodenum. Biopsied. Moderate Sedation:      Per Anesthesia Care Recommendation:           - Discharge patient to home (ambulatory).                           - Resume previous diet.                           - Await pathology results.                           - If nromal biopsies, consider CT abdmen pelvis                            with IV con Procedure Code(s):        --- Professional ---                           [phone], Esophagogastroduodenoscopy, flexible,                            transoral; with biopsy, single or multiple Diagnosis Code(s):        --- Professional ---                           R10.13, Epigastric pain  R14.0, Abdominal distension (gaseous) CPT copyright 2019 [employer]. All rights reserved. The codes documented in this report are preliminary and upon coder review may  be revised to meet current compliance requirements. Maylon Peppers, MD Maylon Peppers,  01/09/2022 3:22:43 PM This report has been signed electronically. Number of Addenda: 0

## 2022-01-09 NOTE — Interval H&P Note (Signed)
History and Physical Interval Note:  01/09/2022 2:53 PM  Christopher [name]  has presented today for surgery, with the diagnosis of Epigastric pain Bloating.  The various methods of treatment have been discussed with the patient and family. After consideration of risks, benefits and other options for treatment, the patient has consented to  Procedure(s) with comments: ESOPHAGOGASTRODUODENOSCOPY (EGD) WITH PROPOFOL (N/A) - 305, moved up per Leigh Amm as a surgical intervention.  The patient's history has been reviewed, patient examined, no change in status, stable for surgery.  I have reviewed the patient's chart and labs.  Questions were answered to the patient's satisfaction.     [name]

## 2022-01-09 NOTE — Discharge Instructions (Signed)
You are being discharged to home.  Resume your previous diet.  We are waiting for your pathology results.  

## 2022-01-09 NOTE — Telephone Encounter (Signed)
Per Dr. Anders Grant amiodarone for now.  Get lab work:  TSH, sed rate and liver panel.  Schedule Pt for repeat PFT's with [facility].

## 2022-01-09 NOTE — Anesthesia Preprocedure Evaluation (Signed)
Anesthesia Evaluation  Patient identified by MRN, date of birth, ID band Patient awake    Reviewed: Allergy & Precautions, H&P , NPO status , Patient's Chart, lab work & pertinent test results, reviewed documented beta blocker date and time   History of Anesthesia Complications (+) PONV and history of anesthetic complications  Airway Mallampati: II  TM Distance: >3 FB Neck ROM: full    Dental no notable dental hx.    Pulmonary sleep apnea , former smoker,    Pulmonary exam normal breath sounds clear to auscultation       Cardiovascular Exercise Tolerance: Good + angina + CAD and + DOE  + dysrhythmias  Rhythm:regular Rate:Normal     Neuro/Psych  Neuromuscular disease negative psych ROS   GI/Hepatic Neg liver ROS, hiatal hernia, GERD  Medicated,  Endo/Other  negative endocrine ROS  Renal/GU negative Renal ROS  negative genitourinary   Musculoskeletal   Abdominal   Peds  Hematology negative hematology ROS (+)   Anesthesia Other Findings   Reproductive/Obstetrics negative OB ROS                             Anesthesia Physical Anesthesia Plan  ASA: 3  Anesthesia Plan: General   Post-op Pain Management:    Induction:   PONV Risk Score and Plan: Propofol infusion and Ondansetron  Airway Management Planned:   Additional Equipment:   Intra-op Plan:   Post-operative Plan:   Informed Consent: I have reviewed the patients History and Physical, chart, labs and discussed the procedure including the risks, benefits and alternatives for the proposed anesthesia with the patient or authorized representative who has indicated his/her understanding and acceptance.     Dental Advisory Given  Plan Discussed with: CRNA  Anesthesia Plan Comments:         Anesthesia Quick Evaluation

## 2022-01-09 NOTE — Anesthesia Procedure Notes (Signed)
Date/Time: 01/09/2022 3:03 PM Performed by: Vista Deck, CRNA Pre-anesthesia Checklist: Patient identified, Emergency Drugs available, Suction available, Timeout performed and Patient being monitored Patient Re-evaluated:Patient Re-evaluated prior to induction Oxygen Delivery Method: Nasal Cannula

## 2022-01-09 NOTE — Transfer of Care (Signed)
Immediate Anesthesia Transfer of Care Note  Patient: Christopher [name]  Procedure(s) Performed: ESOPHAGOGASTRODUODENOSCOPY (EGD) WITH PROPOFOL BIOPSY  Patient Location: Short Stay  Anesthesia Type:General  Level of Consciousness: awake and patient cooperative  Airway & Oxygen Therapy: Patient Spontanous Breathing  Post-op Assessment: Report given to RN and Post -op Vital signs reviewed and stable  Post vital signs: Reviewed and stable  Last Vitals:  Vitals Value Taken Time  BP 95/60 01/09/22 1522  Temp 36.4 C 01/09/22 1519  Pulse 45 01/09/22 1519  Resp 14 01/09/22 1519  SpO2 96 % 01/09/22 1522    Last Pain:  Vitals:   01/09/22 1522  TempSrc:   PainSc: 0-No pain         Complications: No notable events documented.

## 2022-01-09 NOTE — Telephone Encounter (Signed)
Left message for patient to return call.  Orders entered for lab work and PFT's

## 2022-01-10 NOTE — Anesthesia Postprocedure Evaluation (Signed)
Anesthesia Post Note  Patient: Christopher [name]  Procedure(s) Performed: ESOPHAGOGASTRODUODENOSCOPY (EGD) WITH PROPOFOL BIOPSY  Patient location during evaluation: Phase II Anesthesia Type: General Level of consciousness: awake Pain management: pain level controlled Vital Signs Assessment: post-procedure vital signs reviewed and stable Respiratory status: spontaneous breathing and respiratory function stable Cardiovascular status: blood pressure returned to baseline and stable Postop Assessment: no headache and no apparent nausea or vomiting Anesthetic complications: no Comments: Late entry   No notable events documented.   Last Vitals:  Vitals:   01/09/22 1519 01/09/22 1522  BP:  95/60  Pulse: (!) 45   Resp: 14   Temp: 36.4 C   SpO2:  96%    Last Pain:  Vitals:   01/09/22 1522  TempSrc:   PainSc: 0-No pain                 Louann Sjogren

## 2022-01-11 LAB — SURGICAL PATHOLOGY

## 2022-01-15 ENCOUNTER — Encounter (HOSPITAL_COMMUNITY): Payer: Self-pay | Admitting: Gastroenterology

## 2022-01-16 DIAGNOSIS — I10 Essential (primary) hypertension: Secondary | ICD-10-CM | POA: Diagnosis not present

## 2022-01-16 DIAGNOSIS — E782 Mixed hyperlipidemia: Secondary | ICD-10-CM | POA: Diagnosis not present

## 2022-01-23 ENCOUNTER — Other Ambulatory Visit: Payer: Self-pay | Admitting: Interventional Cardiology

## 2022-01-25 ENCOUNTER — Other Ambulatory Visit (INDEPENDENT_AMBULATORY_CARE_PROVIDER_SITE_OTHER): Payer: Self-pay

## 2022-01-25 DIAGNOSIS — G4700 Insomnia, unspecified: Secondary | ICD-10-CM | POA: Diagnosis not present

## 2022-01-25 DIAGNOSIS — K219 Gastro-esophageal reflux disease without esophagitis: Secondary | ICD-10-CM | POA: Diagnosis not present

## 2022-01-25 DIAGNOSIS — I2510 Atherosclerotic heart disease of native coronary artery without angina pectoris: Secondary | ICD-10-CM | POA: Diagnosis not present

## 2022-01-25 DIAGNOSIS — Z6823 Body mass index (BMI) 23.0-23.9, adult: Secondary | ICD-10-CM | POA: Diagnosis not present

## 2022-01-25 DIAGNOSIS — I4819 Other persistent atrial fibrillation: Secondary | ICD-10-CM | POA: Diagnosis not present

## 2022-01-25 DIAGNOSIS — R140 Abdominal distension (gaseous): Secondary | ICD-10-CM

## 2022-01-25 DIAGNOSIS — M159 Polyosteoarthritis, unspecified: Secondary | ICD-10-CM | POA: Diagnosis not present

## 2022-01-25 DIAGNOSIS — K5909 Other constipation: Secondary | ICD-10-CM | POA: Diagnosis not present

## 2022-01-25 DIAGNOSIS — M5136 Other intervertebral disc degeneration, lumbar region: Secondary | ICD-10-CM | POA: Diagnosis not present

## 2022-01-25 DIAGNOSIS — G8929 Other chronic pain: Secondary | ICD-10-CM

## 2022-01-25 DIAGNOSIS — R1013 Epigastric pain: Secondary | ICD-10-CM

## 2022-01-25 DIAGNOSIS — E782 Mixed hyperlipidemia: Secondary | ICD-10-CM | POA: Diagnosis not present

## 2022-01-30 ENCOUNTER — Other Ambulatory Visit (INDEPENDENT_AMBULATORY_CARE_PROVIDER_SITE_OTHER): Payer: Self-pay | Admitting: Gastroenterology

## 2022-01-30 DIAGNOSIS — I10 Essential (primary) hypertension: Secondary | ICD-10-CM | POA: Diagnosis not present

## 2022-01-30 DIAGNOSIS — M5416 Radiculopathy, lumbar region: Secondary | ICD-10-CM | POA: Diagnosis not present

## 2022-01-30 DIAGNOSIS — R2681 Unsteadiness on feet: Secondary | ICD-10-CM | POA: Diagnosis not present

## 2022-01-30 DIAGNOSIS — R251 Tremor, unspecified: Secondary | ICD-10-CM | POA: Diagnosis not present

## 2022-01-30 NOTE — Telephone Encounter (Signed)
Seen 01/02/22

## 2022-02-06 ENCOUNTER — Ambulatory Visit (HOSPITAL_COMMUNITY)
Admission: RE | Admit: 2022-02-06 | Discharge: 2022-02-06 | Disposition: A | Discharge status: Home / Self Care | Payer: Medicare Other | Source: Ambulatory Visit | Attending: Gastroenterology | Admitting: Gastroenterology

## 2022-02-06 ENCOUNTER — Other Ambulatory Visit: Payer: Self-pay

## 2022-02-06 DIAGNOSIS — G8929 Other chronic pain: Secondary | ICD-10-CM | POA: Diagnosis not present

## 2022-02-06 DIAGNOSIS — I700 Atherosclerosis of aorta: Secondary | ICD-10-CM | POA: Diagnosis not present

## 2022-02-06 DIAGNOSIS — K8020 Calculus of gallbladder without cholecystitis without obstruction: Secondary | ICD-10-CM | POA: Diagnosis not present

## 2022-02-06 DIAGNOSIS — R1013 Epigastric pain: Secondary | ICD-10-CM | POA: Insufficient documentation

## 2022-02-06 DIAGNOSIS — R140 Abdominal distension (gaseous): Secondary | ICD-10-CM | POA: Diagnosis not present

## 2022-02-06 DIAGNOSIS — R109 Unspecified abdominal pain: Secondary | ICD-10-CM | POA: Diagnosis not present

## 2022-02-06 MED ORDER — IOHEXOL 300 MG/ML  SOLN
100.0000 mL | Freq: Once | INTRAMUSCULAR | Status: AC | PRN
  Administered 2022-02-06: 100 mL via INTRAVENOUS

## 2022-02-07 ENCOUNTER — Other Ambulatory Visit (INDEPENDENT_AMBULATORY_CARE_PROVIDER_SITE_OTHER): Payer: Self-pay | Admitting: Gastroenterology

## 2022-02-07 DIAGNOSIS — K581 Irritable bowel syndrome with constipation: Secondary | ICD-10-CM

## 2022-02-07 MED ORDER — LINACLOTIDE 72 MCG PO CAPS: 72.0000 ug | ORAL_CAPSULE | Freq: Every day | ORAL | 3 refills | Status: DC

## 2022-02-16 ENCOUNTER — Other Ambulatory Visit: Payer: Self-pay | Admitting: Interventional Cardiology

## 2022-02-25 ENCOUNTER — Other Ambulatory Visit: Payer: Self-pay | Admitting: Cardiovascular Disease

## 2022-03-15 ENCOUNTER — Other Ambulatory Visit: Payer: Self-pay | Admitting: Cardiovascular Disease

## 2022-03-16 ENCOUNTER — Other Ambulatory Visit: Payer: Self-pay | Admitting: Neurosurgery

## 2022-03-16 ENCOUNTER — Encounter: Payer: Self-pay | Admitting: Urology

## 2022-03-16 ENCOUNTER — Ambulatory Visit: Payer: Medicare Other | Admitting: Urology

## 2022-03-16 VITALS — BP 99/58 | HR 55

## 2022-03-16 DIAGNOSIS — R351 Nocturia: Secondary | ICD-10-CM | POA: Diagnosis not present

## 2022-03-16 DIAGNOSIS — R3912 Poor urinary stream: Secondary | ICD-10-CM

## 2022-03-16 DIAGNOSIS — N401 Enlarged prostate with lower urinary tract symptoms: Secondary | ICD-10-CM | POA: Diagnosis not present

## 2022-03-16 DIAGNOSIS — N138 Other obstructive and reflux uropathy: Secondary | ICD-10-CM | POA: Diagnosis not present

## 2022-03-16 LAB — URINALYSIS, ROUTINE W REFLEX MICROSCOPIC
Bilirubin, UA: NEGATIVE
Glucose, UA: NEGATIVE
Ketones, UA: NEGATIVE
Leukocytes,UA: NEGATIVE
Nitrite, UA: NEGATIVE
Protein,UA: NEGATIVE
RBC, UA: NEGATIVE
Specific Gravity, UA: 1.025 (ref 1.005–1.030)
Urobilinogen, Ur: 0.2 mg/dL (ref 0.2–1.0)
pH, UA: 6 (ref 5.0–7.5)

## 2022-03-16 NOTE — Patient Instructions (Signed)
Prostatic Urethral Lift ?Prostatic urethral lift is a surgical procedure to treat symptoms of prostate gland enlargement that occurs with age (benign prostatic hypertrophy, BPH). ?The urethra passes between the two lobes of the prostate. The urethra is the part of the body that drains urine from the bladder. As the prostate enlarges, it can push on the urethra and cause problems with urinating. This procedure involves placing an implant that holds the prostate away from the urethra. The procedure is done using a thin device called a cystoscope. The device is inserted through the tip of the penis and moved up the urethra to the prostate. This is less invasive than other procedures that require an incision. ?You may have this procedure if: ?You have symptoms of BPH. ?Your prostate is not severely enlarged. ?Medicines to treat BPH are not working or not tolerated. ?You want to avoid possible sexual side effects from medicines or other procedures that are used to treat BPH. ?Tell a health care provider about: ?Any allergies you have. ?All medicines you are taking, including vitamins, herbs, eye drops, creams, and over-the-counter medicines. ?Any problems you or family members have had with anesthetic medicines. ?Any bleeding problems you have. ?Any surgeries you have had. ?Any medical conditions you have. ?What are the risks? ?Generally, this is a safe procedure. However, problems may occur, including: ?Bleeding. ?Infection. ?Leaking of urine (incontinence). ?Allergic reactions to medicines. ?Return of BPH symptoms after 2 years, requiring more treatment. ?What happens before the procedure? ?When to stop eating and drinking ?Follow instructions from your health care provider about what you may eat and drink before your procedure. These may include: ?8 hours before your procedure ?Stop eating most foods. Do not eat meat, fried foods, or fatty foods. ?Eat only light foods, such as toast or crackers. ?All liquids are okay  except energy drinks and alcohol. ?6 hours before your procedure ?Stop eating. ?Drink only clear liquids, such as water, clear fruit juice, black coffee, plain tea, and sports drinks. ?Do not drink energy drinks or alcohol. ?2 hours before your procedure ?Stop drinking all liquids. ?You may be allowed to take medicines with small sips of water. ?If you do not follow your health care provider's instructions, your procedure may be delayed or canceled. ?Medicines ?Ask your health care provider about: ?Changing or stopping your regular medicines. This is especially important if you are taking diabetes medicines or blood thinners. ?Taking medicines such as aspirin and ibuprofen. These medicines can thin your blood. Do not take these medicines unless your health care provider tells you to take them. ?Taking over-the-counter medicines, vitamins, herbs, and supplements. ?Surgery safety ?Ask your health care provider what steps will be taken to help prevent infection. These steps may include: ?Removing hair at the surgery site. ?Washing skin with a germ-killing soap. ?Taking antibiotic medicine. ?General instructions ?Do not use any products that contain nicotine or tobacco for at least 4 weeks before the procedure. These products include cigarettes, chewing tobacco, and vaping devices, such as e-cigarettes. If you need help quitting, ask your health care provider. ?If you will be going home right after the procedure, plan to have a responsible adult: ?Take you home from the hospital or clinic. You will not be allowed to drive. ?Care for you for the time you are told. ?What happens during the procedure? ?An IV may be inserted into one of your veins. ?You will be given one or more of the following: ?A medicine to help you relax (sedative). ?A medicine that is  injected into your urethra to numb the area (local anesthetic). ?A medicine to make you fall asleep (general anesthetic). ?A cystoscope will be inserted into your penis  and moved through your urethra to your prostate. ?A device will be inserted through the cystoscope and used to press the lobes of your prostate away from your urethra. ?Implants will be inserted through the device to hold the lobes of your prostate in the widened position. ?The device and cystoscope will be removed. ?The procedure may vary among health care providers and hospitals. ?What happens after the procedure? ?Your blood pressure, heart rate, breathing rate, and blood oxygen level be monitored until you leave the hospital or clinic. ?If you were given a sedative during the procedure, it can affect you for several hours. Do not drive or operate machinery until your health care provider says that it is safe. ?Summary ?Prostatic urethral lift is a surgical procedure to relieve symptoms of prostate gland enlargement that occurs with age (benign prostatic hypertrophy, BPH). ?The procedure is performed with a thin device called a cystoscope. This device is inserted through the tip of the penis and moved up the urethra to reach the prostate. This is less invasive than other procedures that require an incision. ?If you will be going home right after the procedure, plan to have a responsible adult take you home from the hospital or clinic. You will not be allowed to drive. ?This information is not intended to replace advice given to you by your health care provider. Make sure you discuss any questions you have with your health care provider. ?Document Revised: 06/30/2021 Document Reviewed: 06/30/2021 ?Elsevier Patient Education ? [employer]. ? ?

## 2022-03-16 NOTE — Progress Notes (Signed)
? ?03/16/2022 ?9:48 AM  ? ?Christopher [name] ?Sep 23, 1946 ?[id number] ? ?Referring provider: Celene Squibb, MD ?[address] ?[address] ?[place],  [place] [address] ? ?Followup BPH and nocturia ? ? ?HPI: ?Christopher [name] is a 76yo here for followup for BPH and weak urinary stream and nocturia. He remains on rapaflo '8mg'$  daily and finasteride '5mg'$  daily. IPSS 14 QOL 4. He is unhappy with his urination. He has a weak stream. He has urinary hesitancy. Nocturia 2-3x. NO other complaints today ? ? ?PMH: ?Past Medical History:  ?Diagnosis Date  ? A-fib ([place])   ? Arthritis   ? "all over my body" (06/17/2017)  ? BPH (benign prostatic hyperplasia)   ? takes Proscar daily  ? Chronic back pain   ? DDD; "upper and lower" (06/17/2017)  ? Constipation   ? takes Colace daily  ? DDD (degenerative disc disease), cervical   ? DVT (deep venous thrombosis) ([place])   ? left peroneal vein 07/2017 (in setting of recent left TKA)  ? Dysrhythmia   ? history of Atrial fibrillation, no long has this  ? Enlarged prostate   ? GERD (gastroesophageal reflux disease)   ? takes Nexium daily  ? H/O hiatal hernia   ? History of colon polyps   ? benign  ? History of gastric ulcer   ? Insomnia   ? takes Xanax nightly  ? Joint capsule tear   ? left knee  ? Joint pain   ? Joint swelling   ? Macular degeneration   ? Muscle spasm of back   ? takes Valium daily as needed  ? PAF (paroxysmal atrial fibrillation) ([place])   ? Peripheral neuropathy   ? takes Gabapentin daily  ? PONV (postoperative nausea and vomiting)   ? Sleep apnea   ? pt does not wear a cpap  ? Urinary hesitancy   ? Weakness   ? numbness and tingling  ? ? ?Surgical History: ?Past Surgical History:  ?Procedure Laterality Date  ? ANTERIOR CERVICAL DECOMP/DISCECTOMY FUSION  ~ 1991  ? "took bone from hip"  ? ANTERIOR CERVICAL DECOMP/DISCECTOMY FUSION  X 2  ? "put plate in"  ? ANTERIOR CERVICAL DISCECTOMY  <11/2005  ?  5-6 and 4-5./notes 05/02/2011  ? APPENDECTOMY    ? BACK SURGERY    ? BIOPSY  05/30/2018  ? Procedure: BIOPSY;   Surgeon: [name], MD;  [facility];  Service: Endoscopy;;  esophagus  ? BIOPSY  01/09/2022  ? Procedure: BIOPSY;  Surgeon: Harvel Quale, MD;  [facility];  Service: Gastroenterology;;  ? CARDIAC CATHETERIZATION    ? CARPAL TUNNEL RELEASE Bilateral 10/2005 - 11/2005  ? right-left [facility] 05/02/2011  ? CATARACT EXTRACTION W/PHACO Right 05/24/2014  ? Procedure: CATARACT EXTRACTION PHACO AND INTRAOCULAR LENS PLACEMENT (IOC);  Surgeon: [name], MD;  Location: [facility];  Service: Ophthalmology;  Laterality: Right;  CDE 6.74  ? CATARACT EXTRACTION W/PHACO Left 06/17/2014  ? Procedure: CATARACT EXTRACTION PHACO AND INTRAOCULAR LENS PLACEMENT (IOC);  Surgeon: [name], MD;  Location: [facility];  Service: Ophthalmology;  Laterality: Left;  CDE:4.65  ? COLONOSCOPY WITH ESOPHAGOGASTRODUODENOSCOPY (EGD) N/A 01/22/2013  ? Procedure: COLONOSCOPY WITH ESOPHAGOGASTRODUODENOSCOPY (EGD);  Surgeon: [name], MD;  [facility];  Service: Endoscopy;  Laterality: N/A;  140  ? COLONOSCOPY WITH PROPOFOL N/A 05/30/2018  ? diverticulosis and external hemorrhoids  ? ESOPHAGOGASTRODUODENOSCOPY (EGD) WITH PROPOFOL N/A 05/30/2018  ? normal esophagus, presence of possible barretts (bx consistent with  reflux changes, GAVE)  ? ESOPHAGOGASTRODUODENOSCOPY (EGD) WITH PROPOFOL N/A 01/09/2022  ? Procedure: ESOPHAGOGASTRODUODENOSCOPY (EGD) WITH PROPOFOL;  Surgeon: Harvel Quale, MD;  [facility];  Service: Gastroenterology;  Laterality: N/A;  305, moved up per Leigh Amm  ? EYE SURGERY Bilateral   ? "cleaned film w/laser; since my 1st cataract OR"  ? JOINT REPLACEMENT    ? LEFT HEART CATH AND CORONARY ANGIOGRAPHY N/A 02/16/2019  ? Procedure: LEFT HEART CATH AND CORONARY ANGIOGRAPHY;  Surgeon: Belva Crome, MD;  Location: [facility];  Service: Cardiovascular;  Laterality: N/A;  ? POSTERIOR LUMBAR FUSION    ? "put a plate in"  ? QUADRICEPS TENDON REPAIR Left  07/01/2017  ? QUADRICEPS TENDON REPAIR Left 07/01/2017  ? Procedure: REPAIR QUADRICEP TENDON;  Surgeon: [name], MD;  Location: [facility];  Service: Orthopedics;  Laterality: Left;  ? SHOULDER ARTHROSCOPY Bilateral   ? SPINAL CORD STIMULATOR INSERTION N/A 09/23/2015  ? Procedure: LUMBAR SPINAL CORD STIMULATOR INSERTION;  Surgeon: [name], MD;  Location: [facility];  Service: Neurosurgery;  Laterality: N/A;  LUMBAR SPINAL CORD STIMULATOR INSERTION  ? TOTAL KNEE ARTHROPLASTY Left 06/17/2017  ? TOTAL KNEE ARTHROPLASTY Left 06/17/2017  ? Procedure: TOTAL KNEE ARTHROPLASTY;  Surgeon: [name], MD;  Location: [facility];  Service: Orthopedics;  Laterality: Left;  ? TOTAL KNEE ARTHROPLASTY Right 12/16/2017  ? Procedure: TOTAL KNEE ARTHROPLASTY;  Surgeon: [name], MD;  Location: [facility];  Service: Orthopedics;  Laterality: Right;  ? ? ?Home Medications:  ?Allergies as of 03/16/2022   ? ?   Reactions  ? Tylenol [acetaminophen]   ? Stomach pain   ? Morphine And Related Nausea And Vomiting  ? Propofol Nausea And Vomiting  ? ?  ? ?  ?Medication List  ?  ? ?  ? Accurate as of March 16, 2022  9:48 AM. If you have any questions, ask your nurse or doctor.  ?  ?  ? ?  ? ?ALPRAZolam 1 MG tablet ?Commonly known as: Duanne Moron ?Take 1 mg by mouth at bedtime. ?  ?amiodarone 200 MG tablet ?Commonly known as: PACERONE ?TAKE 1 TABLET BY MOUTH EVERY DAY ?  ?apixaban 5 MG Tabs tablet ?Commonly known as: ELIQUIS ?Take 1 tablet (5 mg total) by mouth 2 (two) times daily. ?  ?aspirin EC 81 MG tablet ?Take 1 tablet (81 mg total) by mouth daily. ?  ?atorvastatin 80 MG tablet ?Commonly known as: LIPITOR ?TAKE 1 TABLET (80 MG TOTAL) BY MOUTH DAILY. PLEASE MAKE OVERDUE APPT WITH DR. Johnsie Cancel BEFORE ANYMORE REFILLS. [place] YOU 3RD ATTEMPT ?  ?B-12 PO ?Take 1 tablet by mouth daily. ?  ?docusate sodium 250 MG capsule ?Commonly known as: COLACE ?Take 250 mg by mouth daily. ?  ?FIBER-CAPS PO ?Take 2 capsules by mouth daily. ?  ?finasteride 5 MG  tablet ?Commonly known as: PROSCAR ?Take 1 tablet (5 mg total) by mouth daily. ?  ?furosemide 20 MG tablet ?Commonly known as: LASIX ?Take 1 tablet (20 mg total) by mouth daily. ?  ?gabapentin 400 MG capsule ?Commonly known as: NEURONTIN ?One three times a day ?What changed:  ?how much to take ?how to take this ?when to take this ?additional instructions ?  ?isosorbide mononitrate 60 MG 24 hr tablet ?Commonly known as: IMDUR ?Take 60 mg by mouth daily. ?  ?linaclotide 72 MCG capsule ?Commonly known as: Linzess ?Take 1 capsule (72 mcg total) by mouth daily before breakfast. ?  ?metoprolol tartrate 25 MG  tablet ?Commonly known as: LOPRESSOR ?Take 25 mg by mouth 2 (two) times daily. ?  ?nitroGLYCERIN 0.4 MG SL tablet ?Commonly known as: NITROSTAT ?Place 1 tablet (0.4 mg total) under the tongue every 5 (five) minutes as needed for chest pain. ?  ?Omega-3 1000 MG Caps ?Take 1,000 mg by mouth daily. ?  ?pantoprazole 40 MG tablet ?Commonly known as: PROTONIX ?TAKE 1 TABLET BY MOUTH TWICE A DAY (30 TO 60 MINUTES BEFORE 1ST AND LAST MEALS OF THE DAY) ?  ?polyethylene glycol powder 17 GM/SCOOP powder ?Commonly known as: GLYCOLAX/MIRALAX ?Take 8.5 g by mouth daily. ?What changed: how much to take ?  ?potassium chloride 10 MEQ tablet ?Commonly known as: KLOR-CON M ?Take 10 mEq by mouth daily. ?  ?silodosin 8 MG Caps capsule ?Commonly known as: RAPAFLO ?TAKE 1 CAPSULE BY MOUTH EVERYDAY AT BEDTIME ?  ?Simethicone 180 MG Caps ?Take 1 capsule (180 mg total) by mouth 3 (three) times daily as needed. ?  ?sucralfate 1 g tablet ?Commonly known as: CARAFATE ?TAKE 1 TABLET (1 G TOTAL) BY MOUTH 4 TIMES A DAY WITH MEALS AND AT BEDTIME ?  ?traMADol 50 MG tablet ?Commonly known as: ULTRAM ?Take 50-100 mg by mouth every 4 (four) hours as needed for pain. ?  ?Vitamin D3 10 MCG (400 UNIT) Caps ?Take 400 Units by mouth daily. ?  ?vitamin E 200 UNIT capsule ?Take 200 Units by mouth daily. ?  ?ZINC 15 PO ?Take 15 mg by mouth daily. ?  ? ?   ? ? ?Allergies:  ?Allergies  ?Allergen Reactions  ? Tylenol [Acetaminophen]   ?  Stomach pain   ? Morphine And Related Nausea And Vomiting  ? Propofol Nausea And Vomiting  ? ? ?Family History: ?No family history on file. ? ?Soc

## 2022-03-19 ENCOUNTER — Other Ambulatory Visit: Payer: Self-pay | Admitting: Neurosurgery

## 2022-03-19 ENCOUNTER — Telehealth: Payer: Self-pay

## 2022-03-19 NOTE — Telephone Encounter (Signed)
I spoke with Christopher [name]. We have discussed possible surgery dates and 04/16/2022 was agreed upon by all parties. Patient given information about surgery date, what to expect pre-operatively and post operatively.  ?  ?We discussed that a pre-op nurse will be calling to set up the pre-op visit that will take place prior to surgery. Informed patient that our office will communicate any additional care to be provided after surgery.  ? ?Pending clearance to hold Eliquis from Dr. Wende Neighbors sent on 03/19/2022 ?  ?Patients questions or concerns were discussed during our call. Advised to call our office should there be any additional information, questions or concerns that arise. Patient verbalized understanding.   ?

## 2022-03-20 NOTE — Progress Notes (Signed)
Surgical Instructions ? ? ? Your procedure is scheduled on Monday, April 17th, 2023. ? ? Report to [facility] Main Entrance "A" at 11:00 A.M., then check in with the Admitting office. ? Call this number if you have problems the morning of surgery: ? [phone] ? ? If you have any questions prior to your surgery date call [phone]: Open Monday-Friday 8am-4pm ? ? ? Remember: ? Do not eat after midnight the night before your surgery ? ?You may drink clear liquids until 10:00 the morning of your surgery.   ?Clear liquids allowed are: Water, Non-Citrus Juices (without pulp), Carbonated Beverages, Clear Tea, Black Coffee ONLY (NO MILK, CREAM OR POWDERED CREAMER of any kind), and Gatorade ?  ? Take these medicines the morning of surgery with A SIP OF WATER:  ? ?amiodarone (PACERONE)  ?atorvastatin (LIPITOR) ?finasteride (PROSCAR)  ?gabapentin (NEURONTIN)  ?pantoprazole (PROTONIX) ? ?If needed: ? ?nitroGLYCERIN (NITROSTAT)  ?Propylene Glycol (SYSTANE BALANCE ?Simethicone ?traMADol Veatrice Bourbon) ? ?Follow your surgeon's instructions on when to stop Aspirin and Eliquis.  If no instructions were given by your surgeon then you will need to call the office to get those instructions.    ? ?As of today, STOP taking any Aspirin (unless otherwise instructed by your surgeon) Aleve, Naproxen, Ibuprofen, Motrin, Advil, Goody's, BC's, all herbal medications, fish oil, and all vitamins. ? ? ? The day of surgery: ?         ?Do not wear jewelry  ?Do not wear lotions, powders, colognes, or deodorant. ?Men may shave face and neck. ?Do not bring valuables to the hospital. ? ? ?[facility] is not responsible for any belongings or valuables. .  ? ?Do NOT Smoke (Tobacco/Vaping)  24 hours prior to your procedure ? ?If you use a CPAP at night, you may bring your mask for your overnight stay. ?  ?Contacts, glasses, hearing aids, dentures or partials may not be worn into surgery, please bring cases for these belongings ?  ?For patients admitted to  the hospital, discharge time will be determined by your treatment team. ?  ?Patients discharged the day of surgery will not be allowed to drive home, and someone needs to stay with them for 24 hours. ? ? ?SURGICAL WAITING ROOM VISITATION ?Patients having surgery or a procedure in a hospital may have two support people. ?Children under the age of 82 must have an adult with them who is not the patient. ?They may stay in the waiting area during the procedure and may switch out with other visitors. If the patient needs to stay at the hospital during part of their recovery, the visitor guidelines for inpatient rooms apply. ? ?Please refer to the [facility] website for the visitor guidelines for Inpatients (after your surgery is over and you are in a regular room).  ? ? ?Special instructions:   ? ?Oral Hygiene is also important to reduce your risk of infection.  Remember - BRUSH YOUR TEETH THE MORNING OF SURGERY WITH YOUR REGULAR TOOTHPASTE ? ? ?[facility]- Preparing For Surgery ? ?Before surgery, you can play an important role. Because skin is not sterile, your skin needs to be as free of germs as possible. You can reduce the number of germs on your skin by washing with CHG (chlorahexidine gluconate) Soap before surgery.  CHG is an antiseptic cleaner which kills germs and bonds with the skin to continue killing germs even after washing.   ? ? ?Please do not use if you have an allergy to CHG or  antibacterial soaps. If your skin becomes reddened/irritated stop using the CHG.  ?Do not shave (including legs and underarms) for at least 48 hours prior to first CHG shower. It is OK to shave your face. ? ?Please follow these instructions carefully. ?  ? ? Shower the NIGHT BEFORE SURGERY and the MORNING OF SURGERY with CHG Soap.  ? If you chose to wash your hair, wash your hair first as usual with your normal shampoo. After you shampoo, rinse your hair and body thoroughly to remove the shampoo.  Then [employer] and genitals  (private parts) with your normal soap and rinse thoroughly to remove soap. ? ?After that Use CHG Soap as you would any other liquid soap. You can apply CHG directly to the skin and wash gently with a scrungie or a clean washcloth.  ? ?Apply the CHG Soap to your body ONLY FROM THE NECK DOWN.  Do not use on open wounds or open sores. Avoid contact with your eyes, ears, mouth and genitals (private parts). Wash Face and genitals (private parts)  with your normal soap.  ? ?Wash thoroughly, paying special attention to the area where your surgery will be performed. ? ?Thoroughly rinse your body with warm water from the neck down. ? ?DO NOT shower/wash with your normal soap after using and rinsing off the CHG Soap. ? ?Pat yourself dry with a CLEAN TOWEL. ? ?Wear CLEAN PAJAMAS to bed the night before surgery ? ?Place CLEAN SHEETS on your bed the night before your surgery ? ?DO NOT SLEEP WITH PETS. ? ? ?Day of Surgery: ? ?Take a shower with CHG soap. ?Wear Clean/Comfortable clothing the morning of surgery ?Do not apply any deodorants/lotions.   ?Remember to brush your teeth WITH YOUR REGULAR TOOTHPASTE. ? ? ? ?If you received a COVID test during your pre-op visit  it is requested that you wear a mask when out in public, stay away from anyone that may not be feeling well and notify your surgeon if you develop symptoms. If you have been in contact with anyone that has tested positive in the last 10 days please notify you surgeon. ? ?  ?Please read over the following fact sheets that you were given.   ?

## 2022-03-21 ENCOUNTER — Other Ambulatory Visit: Payer: Self-pay

## 2022-03-21 ENCOUNTER — Encounter (HOSPITAL_COMMUNITY): Payer: Self-pay

## 2022-03-21 ENCOUNTER — Encounter (HOSPITAL_COMMUNITY)
Admission: RE | Admit: 2022-03-21 | Discharge: 2022-03-21 | Disposition: A | Discharge status: Home / Self Care | Payer: Medicare Other | Source: Ambulatory Visit | Attending: Neurosurgery | Admitting: Neurosurgery

## 2022-03-21 VITALS — BP 126/67 | HR 57 | Temp 97.8°F | Resp 18 | Ht 68.0 in | Wt 159.9 lb

## 2022-03-21 DIAGNOSIS — Z01812 Encounter for preprocedural laboratory examination: Secondary | ICD-10-CM | POA: Insufficient documentation

## 2022-03-21 DIAGNOSIS — T85113S Breakdown (mechanical) of implanted electronic neurostimulator, generator, sequela: Secondary | ICD-10-CM | POA: Insufficient documentation

## 2022-03-21 DIAGNOSIS — I482 Chronic atrial fibrillation, unspecified: Secondary | ICD-10-CM | POA: Diagnosis not present

## 2022-03-21 DIAGNOSIS — Y828 Other medical devices associated with adverse incidents: Secondary | ICD-10-CM | POA: Diagnosis not present

## 2022-03-21 DIAGNOSIS — G8929 Other chronic pain: Secondary | ICD-10-CM | POA: Diagnosis not present

## 2022-03-21 DIAGNOSIS — G4733 Obstructive sleep apnea (adult) (pediatric): Secondary | ICD-10-CM | POA: Diagnosis not present

## 2022-03-21 DIAGNOSIS — K219 Gastro-esophageal reflux disease without esophagitis: Secondary | ICD-10-CM | POA: Insufficient documentation

## 2022-03-21 DIAGNOSIS — M549 Dorsalgia, unspecified: Secondary | ICD-10-CM | POA: Diagnosis not present

## 2022-03-21 DIAGNOSIS — I2510 Atherosclerotic heart disease of native coronary artery without angina pectoris: Secondary | ICD-10-CM | POA: Insufficient documentation

## 2022-03-21 DIAGNOSIS — Z87891 Personal history of nicotine dependence: Secondary | ICD-10-CM | POA: Diagnosis not present

## 2022-03-21 DIAGNOSIS — Z01818 Encounter for other preprocedural examination: Secondary | ICD-10-CM

## 2022-03-21 DIAGNOSIS — Z951 Presence of aortocoronary bypass graft: Secondary | ICD-10-CM | POA: Insufficient documentation

## 2022-03-21 DIAGNOSIS — Z86718 Personal history of other venous thrombosis and embolism: Secondary | ICD-10-CM | POA: Insufficient documentation

## 2022-03-21 HISTORY — DX: Atherosclerotic heart disease of native coronary artery without angina pectoris: I25.10

## 2022-03-21 LAB — CBC
HCT: 39.7 % (ref 39.0–52.0)
Hemoglobin: 13.1 g/dL (ref 13.0–17.0)
MCH: 31.1 pg (ref 26.0–34.0)
MCHC: 33 g/dL (ref 30.0–36.0)
MCV: 94.3 fL (ref 80.0–100.0)
Platelets: 243 10*3/uL (ref 150–400)
RBC: 4.21 MIL/uL — ABNORMAL LOW (ref 4.22–5.81)
RDW: 12.6 % (ref 11.5–15.5)
WBC: 5.7 10*3/uL (ref 4.0–10.5)
nRBC: 0 % (ref 0.0–0.2)

## 2022-03-21 LAB — BASIC METABOLIC PANEL
Anion gap: 7 (ref 5–15)
BUN: 11 mg/dL (ref 8–23)
CO2: 24 mmol/L (ref 22–32)
Calcium: 9.4 mg/dL (ref 8.9–10.3)
Chloride: 109 mmol/L (ref 98–111)
Creatinine, Ser: 0.96 mg/dL (ref 0.61–1.24)
GFR, Estimated: >60 mL/min (ref >60)
Glucose, Bld: 114 mg/dL — ABNORMAL HIGH (ref 70–99)
Potassium: 3.8 mmol/L (ref 3.5–5.1)
Sodium: 140 mmol/L (ref 135–145)

## 2022-03-21 LAB — SURGICAL PCR SCREEN
MRSA, PCR: NEGATIVE
Staphylococcus aureus: NEGATIVE

## 2022-03-21 NOTE — Progress Notes (Signed)
PCP - Collene Mares, PA ?Cardiologist - Dr. Jenkins Rouge ? ?PPM/ICD - denies ? ?Chest x-ray - 12/31/20 ?EKG - 01/08/22 ?Stress Test - 03/19/18 ?ECHO - 12/25/19 ?Cardiac Cath - 02/16/19 ? ?Sleep Study - app. 5 years ago per pt, OSA+ ?CPAP - no, pt doesn't tolerate ? ?DM- denies ? ?ASA/Blood Thinner Instructions: Pt instructed to call surgeon's office for instructions regarding ASA and Eliquis  ? ? ?ERAS Protcol - yes, no drink ? ? ?COVID TEST- n/a ? ? ?Anesthesia review: yes, cardiac hx ? ?Patient denies shortness of breath, fever, cough and chest pain at PAT appointment ? ? ?All instructions explained to the patient, with a verbal understanding of the material. Patient agrees to go over the instructions while at home for a better understanding. Patient also instructed to notify surgeon of any contact with COVID+ person or if he develops any symptoms. The opportunity to ask questions was provided. ?  ?

## 2022-03-22 ENCOUNTER — Encounter (HOSPITAL_COMMUNITY): Payer: Self-pay | Admitting: Vascular Surgery

## 2022-03-22 DIAGNOSIS — G250 Essential tremor: Secondary | ICD-10-CM | POA: Diagnosis not present

## 2022-03-22 DIAGNOSIS — M5030 Other cervical disc degeneration, unspecified cervical region: Secondary | ICD-10-CM | POA: Diagnosis not present

## 2022-03-22 DIAGNOSIS — K219 Gastro-esophageal reflux disease without esophagitis: Secondary | ICD-10-CM | POA: Diagnosis not present

## 2022-03-22 DIAGNOSIS — I2510 Atherosclerotic heart disease of native coronary artery without angina pectoris: Secondary | ICD-10-CM | POA: Diagnosis not present

## 2022-03-22 DIAGNOSIS — M5136 Other intervertebral disc degeneration, lumbar region: Secondary | ICD-10-CM | POA: Diagnosis not present

## 2022-03-22 DIAGNOSIS — G4700 Insomnia, unspecified: Secondary | ICD-10-CM | POA: Diagnosis not present

## 2022-03-22 DIAGNOSIS — I4819 Other persistent atrial fibrillation: Secondary | ICD-10-CM | POA: Diagnosis not present

## 2022-03-22 DIAGNOSIS — M159 Polyosteoarthritis, unspecified: Secondary | ICD-10-CM | POA: Diagnosis not present

## 2022-03-22 DIAGNOSIS — Z6824 Body mass index (BMI) 24.0-24.9, adult: Secondary | ICD-10-CM | POA: Diagnosis not present

## 2022-03-22 DIAGNOSIS — E782 Mixed hyperlipidemia: Secondary | ICD-10-CM | POA: Diagnosis not present

## 2022-03-22 NOTE — Anesthesia Preprocedure Evaluation (Deleted)
Anesthesia Evaluation  ? ? ?Airway ? ? ? ? ? ? ? Dental ?  ?Pulmonary ?former smoker,  ?  ? ? ? ? ? ? ? Cardiovascular ? ? ? ?  ?Neuro/Psych ?  ? GI/Hepatic ?  ?Endo/Other  ? ? Renal/GU ?  ? ?  ?Musculoskeletal ? ? Abdominal ?  ?Peds ? Hematology ?  ?Anesthesia Other Findings ? ? Reproductive/Obstetrics ? ?  ? ? ? ? ? ? ? ? ? ? ? ? ? ?  ?  ? ? ? ? ? ? ? ? ?Anesthesia Physical ?Anesthesia Plan ? ?ASA:  ? ?Anesthesia Plan:   ? ?Post-op Pain Management:   ? ?Induction:  ? ?PONV Risk Score and Plan:  ? ?Airway Management Planned:  ? ?Additional Equipment:  ? ?Intra-op Plan:  ? ?Post-operative Plan:  ? ?Informed Consent:  ? ?Plan Discussed with:  ? ?Anesthesia Plan Comments: (PAT note written 03/22/2022 by Myra Gianotti, PA-C. ?)  ? ? ? ? ? ? ?Anesthesia Quick Evaluation ? ?

## 2022-03-22 NOTE — Progress Notes (Signed)
Anesthesia Chart Review: ? Case: [id number] Date/Time: 04/02/22 1250  ? Procedure: REVISION OF  SPINAL CORD STIMULATOR - 3C  ? Anesthesia type: General  ? Pre-op diagnosis: MALFUNCTION OF SPINAL CORD STIMULATOR, SEQUELA  ? Location: [facility] OR ROOM 18 / [facility] OR  ? Surgeons: Newman Pies, MD  ? ?  ? ? ?DISCUSSION: Patient is a 76 year old male scheduled for revision of spinal cord stimulator on 04/02/2022 by Dr. Newman Pies.  He is also scheduled for cystoscopy with insertion of UroLift by Dr. Nicolette Bang at [facility] on 04/16/2022. ? ?History includes former smoker (quit 01/14/87), post-operative N/V, afib (diagnosed 07/2017), CAD (s/p CABG: LIMA-LAD, SVG-PDA-OM1 & LAA excision/oversew 09/24/19, Dr. Jomarie Longs at [facility]), DVT (left peroneal DVT 07/23/17, post left TKA), OSA (intolerant to CPAP), BPH, peripheral neuropathy, chronic back pain, GERD, hiatal hernia, spinal surgery (C5-7 ACDF 12/12/05; C3-4 ACDF 02/27/08; L4-5 fusion 03/05/14;[employer] Infin[employer] leads/Spectra SCS generaton spinal cord stimulator 09/23/15), arthritis (left TKA 06/17/17; right TKA 12/16/17).  ? ?S/p CABG 09/2019. Last cardiology visit with Dr. Lovena Le 03/14/21 for routine follow-up and pre-procedure evaluation for spinal injection. He was given permission by cardiology to hold Eliquis for that procedure, as well as for EGD in January 2023. Was able to achieve 4 METS at that time. Nikki at Dr. Adline Mango office is following up with PCP for patient's perioperative ASA/Eliquis instructions.  Denies chest pain, shortness of breath, cough, fever at PAT RN visit. ? ?Anesthesia team to evaluate on the day of surgery. ? ? ?VS: BP 126/67   Pulse (!) 57   Temp 36.6 ?C (Oral)   Resp 18   Ht '5\' 8"'$  (1.727 m)   Wt 72.5 kg   SpO2 97%   BMI 24.31 kg/m?  ? ? ?PROVIDERS: ?Rowan Blase, PA is documented as PCP. Surgeon has Delphina Cahill, MD listed.   ?Jenkins Rouge, MD is cardiologist ?Cristopher Peru, MD is EP. Next visit scheduled for 04/25/22.  ?Chesley Mires, MD is pulmonologist. As needed follow-up recommended on 07/21/21 as patient was intolerant to BiPAP and was using positional therapy.   ?Nicolette Bang, MD is urologist ? ? ?LABS: Labs reviewed: Acceptable for surgery. ?(all labs ordered are listed, but only abnormal results are displayed) ? ?Labs Reviewed  ?BASIC METABOLIC PANEL - Abnormal; Notable for the following components:  ?    Result Value  ? Glucose, Bld 114 (*)   ? All other components within normal limits  ?CBC - Abnormal; Notable for the following components:  ? RBC 4.21 (*)   ? All other components within normal limits  ?SURGICAL PCR SCREEN  ? ? ?OTHER:  ?EGD 01/09/22: ?Impression: ?- Normal esophagus. ?- Normal stomach. Biopsied. [Reactive gastropathy with very focal intestinal metaplasia. Changes compatible with early fundic gland polyp] ?- Normal examined duodenum. Biopsied. [Benign duodenal mucosa] ? ?PULMONARY TESTING (as outlined in 07/21/21 note by Dr. Halford Chessman): ?PFT 10/28/20 >> FEV1 2.25 (80%), FEV1% 80, TLC 5.30 (79%), DLCO 68% ?PSG 08/31/16 >> AHI 2, SpO2 low 85% ?PSG 10/20/19 >> AHI 17.6, SpO2 low 82% ? ? ?IMAGES: ?CT Abd/pelvis 02/06/22: ?IMPRESSION: ?1. Constipation and diverticulosis. No bowel obstruction or ?inflammatory change. ?2. Prostatomegaly with bladder impression and mild chronic bladder ?thickening, which could be muscular hypertrophy or chronic ?inflammation. ?3. The stomach distended with contrast which could be due to recent ?ingestion or impaired gastric emptying. Similar distention noted on ?prior study but with greater fold thickening at that time. ?4. Cholelithiasis without evidence of acute cholecystitis. ?5. Subcentimeter too  small to characterize hypodensity developed in ?the ventral aspect of the spleen, probable small cyst. ?6. Aortic atherosclerosis. ?7. Cardiomegaly with calcific CAD and interval sternotomy. ?8. Osteopenia, degenerative and postsurgical changes of the spine ?with neurostimulator wiring. ?  ? ?CT  C/T/L-spine 01/08/22: ?IMPRESSION: ?Cervical region: No change since 02/14/2021. C2-3 facet ?osteoarthritis worse on the left with 1 mm of anterolisthesis. No ?apparent neural compression. C7-T1 facet osteoarthritis worse on the ?right with 2 mm of anterolisthesis. Bilateral foraminal stenosis ?could possibly affect either C8 nerve. ?  ?Thoracic region: No change since 02/14/2021. Thoracic ?neurostimulator in place. Disc bulges and small disc protrusions ?throughout the thoracic region, but without visible neural ?compression or significant canal encroachment. ?  ?Lumbar region: S1 is lumbarized. Distant PLIF at L5-S1 continues to ?have a good appearance. Enlargement of a right posterolateral disc ?herniation at L3-4 with caudal migration in the right lateral recess ?which could cause right-sided neural compression, particularly of ?the right L4 nerve. Lesser degenerative changes at L1-2, L2-3, L4-5 ?and S1-2 without worsening or definite neural compression. These ?abnormalities could certainly however, contribute to regional pain ?  ? ?EKG: 01/08/22: ?Sinus bradycardia with 1st degree A-V block ?Incomplete right bundle branch block ?Inferior infarct , age undetermined ?Abnormal ECG ?Confirmed by Eleonore Chiquito ([id number]) on 01/08/2022 8:13:59 PM ? ? ?CV: ?Echo 12/25/19: ?IMPRESSIONS  ? 1. Left ventricular ejection fraction, by visual estimation, is 55 to  ?60%. The left ventricle has normal function. There is no left ventricular  ?hypertrophy.  ? 2. The left ventricle has no regional wall motion abnormalities.  ? 3. Global right ventricle has normal systolic function.The right  ?ventricular size is normal. No increase in right ventricular wall  ?thickness.  ? 4. Left atrial size was normal.  ? 5. Right atrial size was normal.  ? 6. The mitral valve is normal in structure. No evidence of mitral valve  ?regurgitation. No evidence of mitral stenosis.  ? 7. The tricuspid valve is normal in structure.  ? 8. The aortic valve is  normal in structure. Aortic valve regurgitation is  ?not visualized. No evidence of aortic valve sclerosis or stenosis.  ? 9. The pulmonic valve was normal in structure. Pulmonic valve  ?regurgitation is not visualized.  ?10. Normal pulmonary artery systolic pressure.  ?11. The inferior vena cava is normal in size with greater than 50%  ?respiratory variability, suggesting right atrial pressure of 3 mmHg.  ? ? ?Cardiac cath 02/16/19: ?Diffuse calcific coronary artery disease particularly affecting the left main and proximal to mid LAD and circumflex. ?Widely patent left main ?Eccentric 50 to 70% proximal LAD stenosis.  Mid eccentric 70 to 80% LAD.  Proximal to the mid LAD lesion the diagonal is moderate in size and contains diffuse 80 to 90% stenosis in the midsegment where the vessel diameter becomes very small. ?Circumflex gives origin to 3 obtuse marginals with both the first and second obtuse marginal containing proximal 50 to 60% stenosis. ?50% mid RCA stenosis. ?Left ventriculography reveals focal mid anterior wall hypokinesis in the distribution of the diagonal.  EF is 60%.  LVEDP is normal. ? ?Recommendations included: Isosorbide 30 mg added and statin therapy increased. If symptoms progressed, could consider orbital or rotational atherectomy of the proximal and mid LAD. Diagonal is not treatable. Ultimately referred for CABG.  ? ? ? ?Past Medical History:  ?Diagnosis Date  ? A-fib ([place])   ? Arthritis   ? "all over my body" (06/17/2017)  ? BPH (benign prostatic hyperplasia)   ?  takes Proscar daily  ? Chronic back pain   ? DDD; "upper and lower" (06/17/2017)  ? Constipation   ? takes Colace daily  ? Coronary artery disease   ? DDD (degenerative disc disease), cervical   ? DVT (deep venous thrombosis) ([place])   ? left peroneal vein 07/2017 (in setting of recent left TKA)  ? Dysrhythmia   ? history of Atrial fibrillation, no long has this  ? Enlarged prostate   ? GERD (gastroesophageal reflux disease)   ? takes Nexium  daily  ? H/O hiatal hernia   ? History of colon polyps   ? benign  ? History of gastric ulcer   ? Insomnia   ? takes Xanax nightly  ? Joint capsule tear   ? left knee  ? Joint pain   ? Joint swelling   ? Macular dege

## 2022-03-27 ENCOUNTER — Telehealth: Payer: Self-pay | Admitting: *Deleted

## 2022-03-27 NOTE — Telephone Encounter (Signed)
Primary Cardiologist:Peter Johnsie Cancel, MD ? ?Chart reviewed as part of pre-operative protocol coverage. Because of Christopher [name] past medical history and time since last visit, he/she will require a follow-up visit in order to better assess preoperative cardiovascular risk. ? ?Pre-op covering staff: ?- Patient has upcoming appointment with Dr. Lovena Le on 04/25/22.  ?- Please contact requesting surgeon's office via preferred method (i.e, phone, fax) to inform them of need for appointment prior to surgery. ? ?This message will also be routed to pharmacy pool and/or primary cardiologist for input on holding anticoagulant/antiplatelet agent as requested below so that this information is available at time of patient's appointment.  ? ?Emmaline Life, NP-C ? ?  ?03/27/2022, 4:40 PM ?[facility] ?[address], [address] ?Office [phone] Fax [phone] ? ?

## 2022-03-27 NOTE — Telephone Encounter (Signed)
? ?  Pre-operative Risk Assessment  ?  ?Patient Name: Christopher [name]  ?DOB: 16-Dec-1946 ?MRN: [id number]  ? ?  ? ?Request for Surgical Clearance   ? ?Procedure:   REVISION OF SPINAL CORD STIMULATOR ? ?Date of Surgery:  Clearance TBD                              ? ?Surgeon:  DR. Newman Pies ?Surgeon's Group or Practice Name:  [facility] ?Phone number:  [phone] ?Fax number:  [phone] ATTN: [id number] ?  ?Type of Clearance Requested:   ?- Medical  ?- Pharmacy:  Hold Aspirin and Apixaban (Eliquis)   ?  ?Type of Anesthesia:  General  ?  ?Additional requests/questions:   ? ?Signed, ?Julaine Hua   ?03/27/2022, 3:29 PM  ? ?

## 2022-03-27 NOTE — Telephone Encounter (Signed)
I called Dr. Arnoldo Morale office 4:58 pm 03/27/22. I pressed option 0 to s/w ask to please s/w Nikki. Phone was answered and then hung on me. I tried to call but I now had the recording the office is closed. I will have to call back in the AM to please confirm if procedure is of urgent matter or is this ok to wait until the pt see's Dr. Lovena Le 04/25/22 which pre op assessment can be done at that time. As my call was disconnected I will fax these notes to requesting office for reply as to urgency or if ok until appt 04/25/22 with cardiologist.  ?

## 2022-03-28 NOTE — Telephone Encounter (Signed)
See notes from pre op provider Christen Bame, NP. Pt has appt 04/25/22 with Dr. Lovena Le. I will send FYI to requesting office the pt has appt 04/25/22 ?

## 2022-03-28 NOTE — Telephone Encounter (Signed)
Patient with diagnosis of atrial fibrillation on Eliquis for anticoagulation.   ? ?Procedure: revision of spinal cord stimulator ?Date of procedure: TBD ? ? ?CHA2DS2-VASc Score = 3  ? This indicates a 3.2% annual risk of stroke. ?The patient's score is based upon: ?CHF History: 0 ?HTN History: 0 ?Diabetes History: 0 ?Stroke History: 0 ?Vascular Disease History: 1 ?Age Score: 2 ?Gender Score: 0 ? ?CrCl 65 ?Platelet count 243 ? ?Per office protocol, patient can hold Eliquis for 3 days prior to procedure.   ?Patient will not need bridging with Lovenox (enoxaparin) around procedure. ? ? ? ?

## 2022-03-30 ENCOUNTER — Telehealth: Payer: Self-pay | Admitting: *Deleted

## 2022-03-30 NOTE — Telephone Encounter (Signed)
? ?  Pre-operative Risk Assessment  ?  ?Patient Name: ROLAND LIPKE  ?DOB: [birth year] ?MRN: [id number]  ? ?  ? ?Request for Surgical Clearance   ? ?Procedure:   UROLIFT ? ?Date of Surgery:  Clearance 04/16/22                              ?   ?Surgeon:  DR. PATRICK MCKENZIE ?Surgeon's Group or Practice Name:  [facility] ?Phone number:  [phone] ?Fax number:  [phone] ATTNEstill Bamberg, RN ?  ?Type of Clearance Requested:   ?- Medical  ?- Pharmacy:  Hold Apixaban (Eliquis) x 3 DAYS PRIOR  and HOLD x 2 POST PROCEDURE (THOUGH I HAVE SENT A MESSAGE TO OFFICE TO CONFIRM THE 2 DAY HOLD S/P PROCEDURE) ?  ?Type of Anesthesia:  General  ?  ?Additional requests/questions:   ? ?Signed, ?Julaine Hua   ?03/30/2022, 12:46 PM  ? ?

## 2022-03-30 NOTE — Telephone Encounter (Signed)
Pt would like to be seen in [place] office for his pre op clearance. Pt has appt 04/03/22 with Bernerd Pho, [place]. Pt asked this will clear him for the spinal stimulator procedure as well as even though that one has been cancelled right now. I advised the pt that he is going to need to d/w the provider 04/03/22. Pt agreeable to plan of care. I assured the pt that I will send notes as FYI to Dr. [name] office that he has an appt 04/03/22. Pt thanked me for the call and the help.  ?

## 2022-03-30 NOTE — Telephone Encounter (Signed)
ADDENDUM: JUST AN UPDATE THAT I DID CONFIRM FOR ELIQUIS HOLD IS : ? ?HOLD ELIQUIS x 3 DAYS PRIOR AND HOLD x 2 DAYS S/P PROCEDURE.  ?

## 2022-04-02 ENCOUNTER — Ambulatory Visit (HOSPITAL_COMMUNITY): Admission: RE | Admit: 2022-04-02 | Payer: Medicare Other | Source: Home / Self Care | Admitting: Neurosurgery

## 2022-04-02 SURGERY — INSERTION, SPINAL CORD STIMULATOR, LUMBAR
Anesthesia: General

## 2022-04-02 NOTE — Telephone Encounter (Signed)
Attempted to reach the patient to make him aware that his appointment is on Wednesday 04/04/22 at 2:30pm. Advised the patient to contact the office with any questions or concerns.  ?

## 2022-04-03 NOTE — Progress Notes (Signed)
? ?Cardiology Office Note   ? ?Date:  04/04/2022  ? ?ID:  Christopher [name], DOB 1946/05/11, MRN [id number] ? ?PCP:  Redmond School, MD  ?Cardiologist: Jenkins Rouge, MD   ?EP: Dr. Lovena Le ? ?Chief Complaint  ?Patient presents with  ? Pre-op Exam  ? ? ?History of Present Illness:   ? ?Christopher [name] is a 76 y.o. male with past medical history of CAD (s/p CABG in 09/2019 with SVG-PDA-OM and LIMA-LAD with LAA excision), paroxysmal atrial fibrillation, OSA (intolerant to CPAP), HTN and HLD who presents to the office today for preoperative cardiac clearance. ? ?He was last examined by Dr. Lovena Le in 02/2021 reported having dyspnea on exertion but denied any associated chest pain or palpitations. He was continued on his current cardiac medications.  ? ?In the interim, the office received notification of the need for upcoming cardiac clearance in regards to revision of his spinal cord stimulator and he was cleared by pharmacy to hold Eliquis for 3 days prior to his procedure. It appears he also needs clearance in regards to cystoscopy with insertion of UroLift.   ? ?In talking with the patient today, he reports his activity has been limited secondary to his back pain. He is hopeful revision of his spinal cord stimulator will help with this. He still tries to stay active and does chores and yard work around his home. He denies any recent chest pain and reports his breathing has been stable. No recent orthopnea, PND or pitting edema. No recent palpitations. Says he was previously symptomatic when in atrial fibrillation and has not experienced any recent episodes. He remains on Eliquis for anticoagulation and denies any recent melena, hematochezia or hematuria.  ? ?Past Medical History:  ?Diagnosis Date  ? A-fib ([place])   ? Arthritis   ? "all over my body" (06/17/2017)  ? BPH (benign prostatic hyperplasia)   ? takes Proscar daily  ? CAD (coronary artery disease)   ? a. s/p CABG in 09/2019 with SVG-PDA-OM and LIMA-LAD with LAA excision   ? Chronic back pain   ? DDD; "upper and lower" (06/17/2017)  ? Constipation   ? takes Colace daily  ? Coronary artery disease   ? DDD (degenerative disc disease), cervical   ? DVT (deep venous thrombosis) ([place])   ? left peroneal vein 07/2017 (in setting of recent left TKA)  ? Dysrhythmia   ? history of Atrial fibrillation, no long has this  ? Enlarged prostate   ? GERD (gastroesophageal reflux disease)   ? takes Nexium daily  ? H/O hiatal hernia   ? History of colon polyps   ? benign  ? History of gastric ulcer   ? Insomnia   ? takes Xanax nightly  ? Joint capsule tear   ? left knee  ? Joint pain   ? Joint swelling   ? Macular degeneration   ? Muscle spasm of back   ? takes Valium daily as needed  ? PAF (paroxysmal atrial fibrillation) ([place])   ? Peripheral neuropathy   ? takes Gabapentin daily  ? PONV (postoperative nausea and vomiting)   ? Sleep apnea   ? pt does not wear a cpap  ? Urinary hesitancy   ? Weakness   ? numbness and tingling  ? ? ?Past Surgical History:  ?Procedure Laterality Date  ? ANTERIOR CERVICAL DECOMP/DISCECTOMY FUSION  ~ 1991  ? "took bone from hip"  ? ANTERIOR CERVICAL DECOMP/DISCECTOMY FUSION  X 2  ? "put plate in"  ?  ANTERIOR CERVICAL DISCECTOMY  <11/2005  ?  5-6 and 4-5./notes 05/02/2011  ? APPENDECTOMY    ? BACK SURGERY    ? BIOPSY  05/30/2018  ? Procedure: BIOPSY;  Surgeon: [name], MD;  [facility];  Service: Endoscopy;;  esophagus  ? BIOPSY  01/09/2022  ? Procedure: BIOPSY;  Surgeon: Montez Morita, Quillian Quince, MD;  [facility];  Service: Gastroenterology;;  ? CARDIAC CATHETERIZATION    ? 3-4 per pt  ? CARPAL TUNNEL RELEASE Bilateral 10/2005 - 11/2005  ? right-left Archie Endo 05/02/2011  ? CATARACT EXTRACTION W/PHACO Right 05/24/2014  ? Procedure: CATARACT EXTRACTION PHACO AND INTRAOCULAR LENS PLACEMENT (IOC);  Surgeon: [name], MD;  Location: [facility];  Service: Ophthalmology;  Laterality: Right;  CDE 6.74  ? CATARACT EXTRACTION W/PHACO Left 06/17/2014  ?  Procedure: CATARACT EXTRACTION PHACO AND INTRAOCULAR LENS PLACEMENT (IOC);  Surgeon: [name], MD;  Location: [facility];  Service: Ophthalmology;  Laterality: Left;  CDE:4.65  ? COLONOSCOPY WITH ESOPHAGOGASTRODUODENOSCOPY (EGD) N/A 01/22/2013  ? Procedure: COLONOSCOPY WITH ESOPHAGOGASTRODUODENOSCOPY (EGD);  Surgeon: [name], MD;  [facility];  Service: Endoscopy;  Laterality: N/A;  140  ? COLONOSCOPY WITH PROPOFOL N/A 05/30/2018  ? diverticulosis and external hemorrhoids  ? CORONARY ARTERY BYPASS GRAFT  2020  ? ESOPHAGOGASTRODUODENOSCOPY (EGD) WITH PROPOFOL N/A 05/30/2018  ? normal esophagus, presence of possible barretts (bx consistent with reflux changes, GAVE)  ? ESOPHAGOGASTRODUODENOSCOPY (EGD) WITH PROPOFOL N/A 01/09/2022  ? Procedure: ESOPHAGOGASTRODUODENOSCOPY (EGD) WITH PROPOFOL;  Surgeon: Harvel Quale, MD;  [facility];  Service: Gastroenterology;  Laterality: N/A;  305, moved up per Leigh Amm  ? EYE SURGERY Bilateral   ? "cleaned film w/laser; since my 1st cataract OR"  ? JOINT REPLACEMENT    ? LEFT HEART CATH AND CORONARY ANGIOGRAPHY N/A 02/16/2019  ? Procedure: LEFT HEART CATH AND CORONARY ANGIOGRAPHY;  Surgeon: Belva Crome, MD;  Location: [facility] CV LAB;  Service: Cardiovascular;  Laterality: N/A;  ? POSTERIOR LUMBAR FUSION    ? "put a plate in"  ? QUADRICEPS TENDON REPAIR Left 07/01/2017  ? QUADRICEPS TENDON REPAIR Left 07/01/2017  ? Procedure: REPAIR QUADRICEP TENDON;  Surgeon: [name], MD;  Location: [facility];  Service: Orthopedics;  Laterality: Left;  ? SHOULDER ARTHROSCOPY Bilateral   ? SPINAL CORD STIMULATOR INSERTION N/A 09/23/2015  ? Procedure: LUMBAR SPINAL CORD STIMULATOR INSERTION;  Surgeon: [name], MD;  Location: [facility];  Service: Neurosurgery;  Laterality: N/A;  LUMBAR SPINAL CORD STIMULATOR INSERTION  ? TOTAL KNEE ARTHROPLASTY Left 06/17/2017  ? TOTAL KNEE ARTHROPLASTY Left 06/17/2017  ? Procedure: TOTAL KNEE ARTHROPLASTY;   Surgeon: [name], MD;  Location: [facility];  Service: Orthopedics;  Laterality: Left;  ? TOTAL KNEE ARTHROPLASTY Right 12/16/2017  ? Procedure: TOTAL KNEE ARTHROPLASTY;  Surgeon: [name], MD;  Location: [facility];  Service: Orthopedics;  Laterality: Right;  ? ? ?Current Medications: ?Outpatient Medications Prior to Visit  ?Medication Sig Dispense Refill  ? ALPRAZolam (XANAX) 1 MG tablet Take 1 mg by mouth at bedtime.  0  ? amiodarone (PACERONE) 200 MG tablet TAKE 1 TABLET BY MOUTH EVERY DAY (Patient taking differently: Take 200 mg by mouth daily.) 90 tablet 3  ? apixaban (ELIQUIS) 5 MG TABS tablet Take 1 tablet (5 mg total) by mouth 2 (two) times daily.    ? aspirin EC 81 MG tablet Take 1 tablet (81 mg total) by mouth daily. 90 tablet 3  ? Calcium  Polycarbophil (FIBER-CAPS PO) Take 2 capsules by mouth daily.    ? Cholecalciferol (VITAMIN D3) 10 MCG (400 UNIT) CAPS Take 400 Units by mouth daily.    ? Cyanocobalamin (B-12 PO) Take 1 tablet by mouth daily.    ? docusate sodium (COLACE) 250 MG capsule Take 250 mg by mouth daily.    ? finasteride (PROSCAR) 5 MG tablet Take 1 tablet (5 mg total) by mouth daily. 90 tablet 3  ? furosemide (LASIX) 20 MG tablet Take 1 tablet (20 mg total) by mouth daily. 90 tablet 3  ? gabapentin (NEURONTIN) 400 MG capsule One three times a day (Patient taking differently: Take 400 mg by mouth 3 (three) times daily.)    ? linaclotide (LINZESS) 72 MCG capsule Take 1 capsule (72 mcg total) by mouth daily before breakfast. 90 capsule 3  ? nitroGLYCERIN (NITROSTAT) 0.4 MG SL tablet Place 1 tablet (0.4 mg total) under the tongue every 5 (five) minutes as needed for chest pain. 25 tablet 5  ? Omega-3 1000 MG CAPS Take 1,000 mg by mouth daily.    ? pantoprazole (PROTONIX) 40 MG tablet TAKE 1 TABLET BY MOUTH TWICE A DAY (30 TO 60 MINUTES BEFORE 1ST AND LAST MEALS OF THE DAY) (Patient taking differently: 40 mg 2 (two) times daily. (30 TO 60 MINUTES BEFORE 1ST AND LAST MEALS OF THE DAY)) 180 tablet 0   ? potassium chloride (KLOR-CON) 10 MEQ tablet Take 10 mEq by mouth daily.    ? Propylene Glycol (SYSTANE BALANCE) 0.6 % SOLN Place 1 drop into both eyes daily as needed (dry eyes).    ? silodosin (RAPAFLO)

## 2022-04-03 NOTE — Telephone Encounter (Signed)
I called the pt confirmed his appt Wed 04/04/22 2:30 with Bernerd Pho, PAC in [place] office . I stated to the pt that I was just double checking myself as I had noted in the notes the wrong date. Pt thanked me for the call and that we will see him tomorrow 04/04/22.   ?

## 2022-04-04 ENCOUNTER — Ambulatory Visit (INDEPENDENT_AMBULATORY_CARE_PROVIDER_SITE_OTHER): Payer: Medicare Other | Admitting: Student

## 2022-04-04 ENCOUNTER — Encounter: Payer: Self-pay | Admitting: Student

## 2022-04-04 VITALS — BP 110/58 | HR 56 | Ht 68.0 in | Wt 160.2 lb

## 2022-04-04 DIAGNOSIS — R600 Localized edema: Secondary | ICD-10-CM

## 2022-04-04 DIAGNOSIS — Z79899 Other long term (current) drug therapy: Secondary | ICD-10-CM

## 2022-04-04 DIAGNOSIS — Z01810 Encounter for preprocedural cardiovascular examination: Secondary | ICD-10-CM

## 2022-04-04 DIAGNOSIS — I480 Paroxysmal atrial fibrillation: Secondary | ICD-10-CM

## 2022-04-04 DIAGNOSIS — I25810 Atherosclerosis of coronary artery bypass graft(s) without angina pectoris: Secondary | ICD-10-CM | POA: Diagnosis not present

## 2022-04-04 MED ORDER — ATORVASTATIN CALCIUM 80 MG PO TABS
80.0000 mg | ORAL_TABLET | Freq: Every day | ORAL | 3 refills | Status: DC
Start: 2022-04-04 — End: 2023-02-11

## 2022-04-04 NOTE — Telephone Encounter (Signed)
Patient with diagnosis of atrial fibrillation on Eliquis for anticoagulation.   ? ?Procedure: urolift ?Date of procedure: 04/16/22 ? ? ?CHA2DS2-VASc Score = 3  ? This indicates a 3.2% annual risk of stroke. ?The patient's score is based upon: ?CHF History: 0 ?HTN History: 0 ?Diabetes History: 0 ?Stroke History: 0 ?Vascular Disease History: 1 ?Age Score: 2 ?Gender Score: 0 ?  ?CrCl 65 ?Platelet count 243 ? ?Per office protocol, patient can hold Eliquis for 3 days prior to procedure.  For the full 5 day hold, will need to have MD approval.   ?  ? ? ? ? ?

## 2022-04-04 NOTE — Patient Instructions (Signed)
Medication Instructions:  ? ?Hold Eliquis after dose on April 27 until surgery  ? ?*If you need a refill on your cardiac medications before your next appointment, please call your pharmacy* ? ? ?Lab Work: ?NONE  ? ?If you have labs (blood work) drawn today and your tests are completely normal, you will receive your results only by: ?MyChart Message (if you have MyChart) OR ?A paper copy in the mail ?If you have any lab test that is abnormal or we need to change your treatment, we will call you to review the results. ? ? ?Testing/Procedures: ?Your physician has recommended that you have a pulmonary function test. Pulmonary Function Tests are a group of tests that measure how well air moves in and out of your lungs. ? ? ?Follow-Up: ?At [facility], you and your health needs are our priority.  As part of our continuing mission to provide you with exceptional heart care, we have created designated Provider Care Teams.  These Care Teams include your primary Cardiologist (physician) and Advanced Practice Providers (APPs -  Physician Assistants and Nurse Practitioners) who all work together to provide you with the care you need, when you need it. ? ?We recommend signing up for the patient portal called "MyChart".  Sign up information is provided on this After Visit Summary.  MyChart is used to connect with patients for Virtual Visits (Telemedicine).  Patients are able to view lab/test results, encounter notes, upcoming appointments, etc.  Non-urgent messages can be sent to your provider as well.   ?To learn more about what you can do with MyChart, go to [email or web address].   ? ?Your next appointment:   ? As scheduled  ? ?The format for your next appointment:   ?In Person ? ?Provider:   ?Cristopher Peru, MD  ? ? ?Other Instructions ?Thank you for choosing [facility]! ? ? ? ?Important Information About [facility] ? ? ? ? ?  ?

## 2022-04-05 ENCOUNTER — Other Ambulatory Visit: Payer: Self-pay | Admitting: Neurosurgery

## 2022-04-05 NOTE — Telephone Encounter (Addendum)
? ? ?  Reviewed with Dr. Lovena Le who approved of the patient holding Eliquis for 3 days prior to his procedure and 2 days afterwards. Called the patient who voiced understanding of this. I have sent a note to Dr. [name] (Urology) to verify if the patient needs to hold ASA as well for his cystoscopy and will follow-up on his reply once available. ? ?Signed, ?Erma Heritage, PA-C ?04/05/2022, 1:23 PM ? ? ?Did hear back from Urology that he can remain on ASA prior to his cystoscopy. This was communicated with the patient who verbalized understanding.  ? ?Signed, ?Erma Heritage, PA-C ?04/05/2022, 2:13 PM ?Pager: [phone] ? ?

## 2022-04-05 NOTE — Telephone Encounter (Signed)
Pt seen in clinic by B. Strader PA-C who was awaiting these recs before communicating her final recommendations to surgeon. Will forward to PA that this is pending MD approval (routed to Dr. Lovena Le below), otherwise no separate input needed from preop team at this time so will remove from preop box. ?

## 2022-04-08 ENCOUNTER — Other Ambulatory Visit: Payer: Self-pay | Admitting: Internal Medicine

## 2022-04-09 ENCOUNTER — Telehealth: Payer: Self-pay

## 2022-04-09 NOTE — Telephone Encounter (Signed)
Reviewed cardiology noted regarding Eliquis hold. ? ?Patient called and reviewed with patient that he will take last dose on 04/27 and hold until after surgery. Patient voiced understanding.  ?

## 2022-04-10 ENCOUNTER — Other Ambulatory Visit (HOSPITAL_COMMUNITY): Payer: Self-pay | Admitting: *Deleted

## 2022-04-10 ENCOUNTER — Ambulatory Visit (HOSPITAL_COMMUNITY)
Admission: RE | Admit: 2022-04-10 | Discharge: 2022-04-10 | Disposition: A | Discharge status: Home / Self Care | Payer: Medicare Other | Source: Ambulatory Visit | Attending: Student | Admitting: Student

## 2022-04-10 DIAGNOSIS — Z79899 Other long term (current) drug therapy: Secondary | ICD-10-CM | POA: Insufficient documentation

## 2022-04-10 LAB — PULMONARY FUNCTION TEST
DL/VA % pred: 84 %
DL/VA: 3.36 ml/min/mmHg/L
DLCO unc % pred: 63 %
DLCO unc: 14.84 ml/min/mmHg
FEF 25-75 Post: 2.48 L/sec
FEF 25-75 Pre: 1.71 L/sec
FEF2575-%Change-Post: 44 %
FEF2575-%Pred-Post: 127 %
FEF2575-%Pred-Pre: 87 %
FEV1-%Change-Post: 5 %
FEV1-%Pred-Post: 85 %
FEV1-%Pred-Pre: 80 %
FEV1-Post: 2.34 L
FEV1-Pre: 2.21 L
FEV1FVC-%Change-Post: 5 %
FEV1FVC-%Pred-Pre: 103 %
FEV6-%Change-Post: 1 %
FEV6-%Pred-Post: 82 %
FEV6-%Pred-Pre: 81 %
FEV6-Post: 2.96 L
FEV6-Pre: 2.91 L
FEV6FVC-%Change-Post: 1 %
FEV6FVC-%Pred-Post: 107 %
FEV6FVC-%Pred-Pre: 105 %
FVC-%Change-Post: 0 %
FVC-%Pred-Post: 77 %
FVC-%Pred-Pre: 76 %
FVC-Post: 2.96 L
FVC-Pre: 2.94 L
Post FEV1/FVC ratio: 79 %
Post FEV6/FVC ratio: 100 %
Pre FEV1/FVC ratio: 75 %
Pre FEV6/FVC Ratio: 99 %
RV % pred: 115 %
RV: 2.88 L
TLC % pred: 88 %
TLC: 5.87 L

## 2022-04-10 MED ORDER — ALBUTEROL SULFATE (2.5 MG/3ML) 0.083% IN NEBU
2.5000 mg | INHALATION_SOLUTION | Freq: Once | RESPIRATORY_TRACT | Status: AC
  Administered 2022-04-10: 2.5 mg via RESPIRATORY_TRACT

## 2022-04-10 NOTE — Patient Instructions (Signed)
? Your procedure is scheduled on: 04/16/2022 ? Report to [facility] at 8:30    AM. ? Call this number if you have problems the morning of surgery: [phone] ? ? Remember: ? ? Do not Eat or Drink after midnight  ? ?      No Smoking the morning of surgery ? : ? Take these medicines the morning of surgery with A SIP OF WATER: Amiodarone, Gabapentin, Finasteride, pantoprazole, and tramadol if needed ? ?            Hold Eliquis as instructed by Dr Natividad Brood office ? ? Do not wear jewelry, make-up or nail polish. ? Do not wear lotions, powders, or perfumes. You may wear deodorant. ? Do not shave 48 hours prior to surgery. Men may shave face and neck. ? Do not bring valuables to the hospital. ? Contacts, dentures or bridgework may not be worn into surgery. ? Leave suitcase in the car. After surgery it may be brought to your room. ? For patients admitted to the hospital, checkout time is 11:00 AM the day of discharge. ? ? Patients discharged the day of surgery will not be allowed to drive home. ?  ? Special Instructions: Shower using CHG night before surgery and shower the day of surgery use CHG.  Use special wash - you have one bottle of CHG for all showers.  You should use approximately 1/2 of the bottle for each shower.  ?How to Use Chlorhexidine for Bathing ?Chlorhexidine gluconate (CHG) is a germ-killing (antiseptic) solution that is used to clean the skin. It can get rid of the bacteria that normally live on the skin and can keep them away for about 24 hours. To clean your skin with CHG, you may be given: ?A CHG solution to use in the shower or as part of a sponge bath. ?A prepackaged cloth that contains CHG. ?Cleaning your skin with CHG may help lower the risk for infection: ?While you are staying in the intensive care unit of the hospital. ?If you have a vascular access, such as a central line, to provide short-term or long-term access to your veins. ?If you have a catheter to drain urine from your  bladder. ?If you are on a ventilator. A ventilator is a machine that helps you breathe by moving air in and out of your lungs. ?After surgery. ?What are the risks? ?Risks of using CHG include: ?A skin reaction. ?Hearing loss, if CHG gets in your ears and you have a perforated eardrum. ?Eye injury, if CHG gets in your eyes and is not rinsed out. ?The CHG product catching fire. ?Make sure that you avoid smoking and flames after applying CHG to your skin. ?Do not use CHG: ?If you have a chlorhexidine allergy or have previously reacted to chlorhexidine. ?On babies younger than 4 months of age. ?How to use CHG solution ?Use CHG only as told by your health care provider, and follow the instructions on the label. ?Use the full amount of CHG as directed. Usually, this is one bottle. ?During a shower ?Follow these steps when using CHG solution during a shower (unless your health care provider gives you different instructions): ?Start the shower. ?Use your normal soap and shampoo to wash your face and hair. ?Turn off the shower or move out of the shower stream. ?Pour the CHG onto a clean washcloth. Do not use any type of brush or rough-edged sponge. ?Starting at your neck, lather your body down to your toes.  Make sure you follow these instructions: ?If you will be having surgery, pay special attention to the part of your body where you will be having surgery. Scrub this area for at least 1 minute. ?Do not use CHG on your head or face. If the solution gets into your ears or eyes, rinse them well with water. ?Avoid your genital area. ?Avoid any areas of skin that have broken skin, cuts, or scrapes. ?Scrub your back and under your arms. Make sure to wash skin folds. ?Let the lather sit on your skin for 1-2 minutes or as long as told by your health care provider. ?Thoroughly rinse your entire body in the shower. Make sure that all body creases and crevices are rinsed well. ?Dry off with a clean towel. Do not put any substances on  your body afterward--such as powder, lotion, or perfume--unless you are told to do so by your health care provider. Only use lotions that are recommended by the manufacturer. ?Put on clean clothes or pajamas. ?If it is the night before your surgery, sleep in clean sheets. ? ?During a sponge bath ?Follow these steps when using CHG solution during a sponge bath (unless your health care provider gives you different instructions): ?Use your normal soap and shampoo to wash your face and hair. ?Pour the CHG onto a clean washcloth. ?Starting at your neck, lather your body down to your toes. Make sure you follow these instructions: ?If you will be having surgery, pay special attention to the part of your body where you will be having surgery. Scrub this area for at least 1 minute. ?Do not use CHG on your head or face. If the solution gets into your ears or eyes, rinse them well with water. ?Avoid your genital area. ?Avoid any areas of skin that have broken skin, cuts, or scrapes. ?Scrub your back and under your arms. Make sure to wash skin folds. ?Let the lather sit on your skin for 1-2 minutes or as long as told by your health care provider. ?Using a different clean, wet washcloth, thoroughly rinse your entire body. Make sure that all body creases and crevices are rinsed well. ?Dry off with a clean towel. Do not put any substances on your body afterward--such as powder, lotion, or perfume--unless you are told to do so by your health care provider. Only use lotions that are recommended by the manufacturer. ?Put on clean clothes or pajamas. ?If it is the night before your surgery, sleep in clean sheets. ?How to use CHG prepackaged cloths ?Only use CHG cloths as told by your health care provider, and follow the instructions on the label. ?Use the CHG cloth on clean, dry skin. ?Do not use the CHG cloth on your head or face unless your health care provider tells you to. ?When washing with the CHG cloth: ?Avoid your genital  area. ?Avoid any areas of skin that have broken skin, cuts, or scrapes. ?Before surgery ?Follow these steps when using a CHG cloth to clean before surgery (unless your health care provider gives you different instructions): ?Using the CHG cloth, vigorously scrub the part of your body where you will be having surgery. Scrub using a back-and-forth motion for 3 minutes. The area on your body should be completely wet with CHG when you are done scrubbing. ?Do not rinse. Discard the cloth and let the area air-dry. Do not put any substances on the area afterward, such as powder, lotion, or perfume. ?Put on clean clothes or pajamas. ?If it is  the night before your surgery, sleep in clean sheets. ? ?For general bathing ?Follow these steps when using CHG cloths for general bathing (unless your health care provider gives you different instructions). ?Use a separate CHG cloth for each area of your body. Make sure you wash between any folds of skin and between your fingers and toes. Wash your body in the following order, switching to a new cloth after each step: ?The front of your neck, shoulders, and chest. ?Both of your arms, under your arms, and your hands. ?Your stomach and groin area, avoiding the genitals. ?Your right leg and foot. ?Your left leg and foot. ?The back of your neck, your back, and your buttocks. ?Do not rinse. Discard the cloth and let the area air-dry. Do not put any substances on your body afterward--such as powder, lotion, or perfume--unless you are told to do so by your health care provider. Only use lotions that are recommended by the manufacturer. ?Put on clean clothes or pajamas. ?Contact a health care provider if: ?Your skin gets irritated after scrubbing. ?You have questions about using your solution or cloth. ?You swallow any chlorhexidine. Call your local poison control center (1-(701) [phone] in the [place] ?Get help right away if: ?Your eyes itch badly, or they become very red or swollen. ?Your  skin itches badly and is red or swollen. ?Your hearing changes. ?You have trouble seeing. ?You have swelling or tingling in your mouth or throat. ?You have trouble breathing. ?These symptoms may represent

## 2022-04-11 ENCOUNTER — Encounter (HOSPITAL_COMMUNITY)
Admission: RE | Admit: 2022-04-11 | Discharge: 2022-04-11 | Disposition: A | Discharge status: Home / Self Care | Payer: Medicare Other | Source: Ambulatory Visit | Attending: Urology | Admitting: Urology

## 2022-04-12 ENCOUNTER — Other Ambulatory Visit: Payer: Self-pay

## 2022-04-12 ENCOUNTER — Encounter (HOSPITAL_COMMUNITY): Payer: Self-pay

## 2022-04-16 ENCOUNTER — Ambulatory Visit (HOSPITAL_BASED_OUTPATIENT_CLINIC_OR_DEPARTMENT_OTHER): Payer: Medicare Other | Admitting: Anesthesiology

## 2022-04-16 ENCOUNTER — Encounter (HOSPITAL_COMMUNITY): Payer: Self-pay | Admitting: Urology

## 2022-04-16 ENCOUNTER — Ambulatory Visit (HOSPITAL_COMMUNITY)
Admission: RE | Admit: 2022-04-16 | Discharge: 2022-04-16 | Disposition: A | Discharge status: Home / Self Care | Payer: Medicare Other | Attending: Urology | Admitting: Urology

## 2022-04-16 ENCOUNTER — Ambulatory Visit (HOSPITAL_COMMUNITY): Payer: Medicare Other | Admitting: Anesthesiology

## 2022-04-16 ENCOUNTER — Encounter (HOSPITAL_COMMUNITY)
Admission: RE | Disposition: A | Discharge status: Home / Self Care | Payer: Self-pay | Source: Home / Self Care | Attending: Urology

## 2022-04-16 DIAGNOSIS — I2510 Atherosclerotic heart disease of native coronary artery without angina pectoris: Secondary | ICD-10-CM | POA: Diagnosis not present

## 2022-04-16 DIAGNOSIS — Z87891 Personal history of nicotine dependence: Secondary | ICD-10-CM | POA: Diagnosis not present

## 2022-04-16 DIAGNOSIS — G4730 Sleep apnea, unspecified: Secondary | ICD-10-CM | POA: Insufficient documentation

## 2022-04-16 DIAGNOSIS — N401 Enlarged prostate with lower urinary tract symptoms: Secondary | ICD-10-CM | POA: Insufficient documentation

## 2022-04-16 DIAGNOSIS — K449 Diaphragmatic hernia without obstruction or gangrene: Secondary | ICD-10-CM | POA: Insufficient documentation

## 2022-04-16 DIAGNOSIS — R3912 Poor urinary stream: Secondary | ICD-10-CM | POA: Diagnosis not present

## 2022-04-16 DIAGNOSIS — G709 Myoneural disorder, unspecified: Secondary | ICD-10-CM | POA: Diagnosis not present

## 2022-04-16 DIAGNOSIS — N400 Enlarged prostate without lower urinary tract symptoms: Secondary | ICD-10-CM | POA: Diagnosis not present

## 2022-04-16 DIAGNOSIS — N138 Other obstructive and reflux uropathy: Secondary | ICD-10-CM | POA: Insufficient documentation

## 2022-04-16 DIAGNOSIS — N320 Bladder-neck obstruction: Secondary | ICD-10-CM | POA: Diagnosis not present

## 2022-04-16 DIAGNOSIS — I4891 Unspecified atrial fibrillation: Secondary | ICD-10-CM

## 2022-04-16 DIAGNOSIS — Z951 Presence of aortocoronary bypass graft: Secondary | ICD-10-CM | POA: Insufficient documentation

## 2022-04-16 HISTORY — PX: CYSTOSCOPY WITH INSERTION OF UROLIFT: SHX6678

## 2022-04-16 SURGERY — CYSTOSCOPY WITH INSERTION OF UROLIFT
Anesthesia: General | Site: Bladder

## 2022-04-16 MED ORDER — PROPOFOL 10 MG/ML IV BOLUS
INTRAVENOUS | Status: AC
  Filled 2022-04-16: qty 20

## 2022-04-16 MED ORDER — LACTATED RINGERS IV SOLN
INTRAVENOUS | Status: DC
Start: 2022-04-16 — End: 2022-04-16

## 2022-04-16 MED ORDER — CHLORHEXIDINE GLUCONATE 0.12 % MT SOLN: 15.0000 mL | Freq: Once | OROMUCOSAL | Status: DC

## 2022-04-16 MED ORDER — PROPOFOL 10 MG/ML IV BOLUS
INTRAVENOUS | Status: DC | PRN
  Administered 2022-04-16: 170 mg via INTRAVENOUS
  Administered 2022-04-16: 30 mg via INTRAVENOUS

## 2022-04-16 MED ORDER — FENTANYL CITRATE (PF) 100 MCG/2ML IJ SOLN
INTRAMUSCULAR | Status: DC | PRN
  Administered 2022-04-16 (×2): 25 ug via INTRAVENOUS

## 2022-04-16 MED ORDER — ONDANSETRON HCL 4 MG/2ML IJ SOLN: 4.0000 mg | Freq: Once | INTRAMUSCULAR | Status: DC | PRN

## 2022-04-16 MED ORDER — LACTATED RINGERS IV SOLN: INTRAVENOUS | Status: DC

## 2022-04-16 MED ORDER — ORAL CARE MOUTH RINSE: 15.0000 mL | Freq: Once | OROMUCOSAL | Status: DC

## 2022-04-16 MED ORDER — ONDANSETRON HCL 4 MG/2ML IJ SOLN
INTRAMUSCULAR | Status: AC
  Filled 2022-04-16: qty 2

## 2022-04-16 MED ORDER — FENTANYL CITRATE (PF) 100 MCG/2ML IJ SOLN
INTRAMUSCULAR | Status: AC
  Filled 2022-04-16: qty 2

## 2022-04-16 MED ORDER — GLYCOPYRROLATE 0.2 MG/ML IJ SOLN
INTRAMUSCULAR | Status: DC | PRN
  Administered 2022-04-16: .2 mg via INTRAVENOUS

## 2022-04-16 MED ORDER — GLYCOPYRROLATE PF 0.2 MG/ML IJ SOSY
PREFILLED_SYRINGE | INTRAMUSCULAR | Status: AC
  Filled 2022-04-16: qty 1

## 2022-04-16 MED ORDER — CHLORHEXIDINE GLUCONATE 0.12 % MT SOLN
15.0000 mL | Freq: Once | OROMUCOSAL | Status: AC
  Administered 2022-04-16: 15 mL via OROMUCOSAL

## 2022-04-16 MED ORDER — ONDANSETRON HCL 4 MG/2ML IJ SOLN
INTRAMUSCULAR | Status: DC | PRN
  Administered 2022-04-16: 4 mg via INTRAVENOUS

## 2022-04-16 MED ORDER — CEFAZOLIN SODIUM-DEXTROSE 2-4 GM/100ML-% IV SOLN
2.0000 g | INTRAVENOUS | Status: AC
  Administered 2022-04-16: 2 g via INTRAVENOUS
  Filled 2022-04-16: qty 100

## 2022-04-16 MED ORDER — FENTANYL CITRATE PF 50 MCG/ML IJ SOSY: 25.0000 ug | PREFILLED_SYRINGE | INTRAMUSCULAR | Status: DC | PRN

## 2022-04-16 MED ORDER — ORAL CARE MOUTH RINSE: 15.0000 mL | Freq: Once | OROMUCOSAL | Status: AC

## 2022-04-16 MED ORDER — TRAMADOL HCL 50 MG PO TABS: 50.0000 mg | ORAL_TABLET | Freq: Four times a day (QID) | ORAL | 0 refills | Status: DC | PRN

## 2022-04-16 MED ORDER — WATER FOR IRRIGATION, STERILE IR SOLN
Status: DC | PRN
  Administered 2022-04-16: 500 mL

## 2022-04-16 SURGICAL SUPPLY — 25 items
BAG DRAIN URO TABLE W/ADPT NS (BAG) ×2 IMPLANT
BAG DRN 8 ADPR NS SKTRN CSTL (BAG) ×1
BAG HAMPER (MISCELLANEOUS) ×2 IMPLANT
CATH FOLEY 2WAY SLVR  5CC 20FR (CATHETERS) ×2
CATH FOLEY 2WAY SLVR 5CC 20FR (CATHETERS) IMPLANT
CLOTH BEACON ORANGE TIMEOUT ST (SAFETY) ×2 IMPLANT
GLOVE BIO SURGEON STRL SZ8 (GLOVE) ×2 IMPLANT
GLOVE BIOGEL PI IND STRL 6.5 (GLOVE) IMPLANT
GLOVE BIOGEL PI IND STRL 7.0 (GLOVE) ×2 IMPLANT
GLOVE BIOGEL PI INDICATOR 6.5 (GLOVE) ×1
GLOVE BIOGEL PI INDICATOR 7.0 (GLOVE) ×1
GLOVE SS BIOGEL STRL SZ 6.5 (GLOVE) IMPLANT
GLOVE SUPERSENSE BIOGEL SZ 6.5 (GLOVE) ×1
GOWN STRL REUS W/TWL LRG LVL3 (GOWN DISPOSABLE) ×2 IMPLANT
GOWN STRL REUS W/TWL XL LVL3 (GOWN DISPOSABLE) ×2 IMPLANT
KIT TURNOVER CYSTO (KITS) ×2 IMPLANT
MANIFOLD NEPTUNE II (INSTRUMENTS) ×2 IMPLANT
PACK CYSTO (CUSTOM PROCEDURE TRAY) ×2 IMPLANT
PAD ARMBOARD 7.5X6 YLW CONV (MISCELLANEOUS) ×2 IMPLANT
SYSTEM UROLIFT (Male Continence) ×7 IMPLANT
TOWEL OR 17X26 4PK STRL BLUE (TOWEL DISPOSABLE) ×2 IMPLANT
TRAY FOLEY W/BAG SLVR 16FR (SET/KITS/TRAYS/PACK) ×2
TRAY FOLEY W/BAG SLVR 16FR ST (SET/KITS/TRAYS/PACK) ×1 IMPLANT
WATER STERILE IRR 3000ML UROMA (IV SOLUTION) ×2 IMPLANT
WATER STERILE IRR 500ML POUR (IV SOLUTION) ×2 IMPLANT

## 2022-04-16 NOTE — Transfer of Care (Signed)
Immediate Anesthesia Transfer of Care Note ? ?Patient: Christopher [name] ? ?Procedure(s) Performed: CYSTOSCOPY WITH INSERTION OF UROLIFT (Bladder) ? ?Patient Location: PACU ? ?Anesthesia Type:General ? ?Level of Consciousness: drowsy and patient cooperative ? ?Airway & Oxygen Therapy: Patient Spontanous Breathing and Patient connected to nasal cannula oxygen ? ?Post-op Assessment: Report given to RN and Post -op Vital signs reviewed and stable ? ?Post vital signs: Reviewed and stable ? ?Last Vitals:  ?Vitals Value Taken Time  ?BP 102/58 04/16/22 1115  ?Temp 97.7 1116  ?Pulse 53 04/16/22 1116  ?Resp 11 04/16/22 1116  ?SpO2 93 % 04/16/22 1116  ?Vitals shown include unvalidated device data. ? ?Last Pain:  ?Vitals:  ? 04/16/22 0927  ?TempSrc: Oral  ?PainSc: 4   ?   ? ?Patients Stated Pain Goal: 6 (04/16/22 2820) ? ?Complications: No notable events documented. ?

## 2022-04-16 NOTE — Anesthesia Procedure Notes (Signed)
Procedure Name: LMA Insertion ?Date/Time: 04/16/2022 10:43 AM ?Performed by: Vista Deck, CRNA ?Pre-anesthesia Checklist: Patient identified, Patient being monitored, Emergency Drugs available, Timeout performed and Suction available ?Patient Re-evaluated:Patient Re-evaluated prior to induction ?Oxygen Delivery Method: Circle System Utilized ?Preoxygenation: Pre-oxygenation with 100% oxygen ?Induction Type: IV induction ?Ventilation: Mask ventilation without difficulty ?LMA: LMA inserted ?LMA Size: 4.0 ?Number of attempts: 1 ?Placement Confirmation: positive ETCO2 and breath sounds checked- equal and bilateral ?Tube secured with: Tape ?Dental Injury: Teeth and Oropharynx as per pre-operative assessment  ? ? ? ? ?

## 2022-04-16 NOTE — H&P (Signed)
Urology Admission H&P ? ?Chief Complaint: difficulty urinating ? ?History of Present Illness: Mr Heap is a 76yo here for Urolift for BPH and weak urinary stream and nocturia. He remains on rapaflo '8mg'$  daily and finasteride '5mg'$  daily. IPSS 14 QOL 4. He is unhappy with his urination. He has a weak stream. He has urinary hesitancy. Nocturia 2-3x.  ? ?Past Medical History:  ?Diagnosis Date  ? A-fib ([place])   ? Arthritis   ? "all over my body" (06/17/2017)  ? BPH (benign prostatic hyperplasia)   ? takes Proscar daily  ? CAD (coronary artery disease)   ? a. s/p CABG in 09/2019 with SVG-PDA-OM and LIMA-LAD with LAA excision  ? Chronic back pain   ? DDD; "upper and lower" (06/17/2017)  ? Constipation   ? takes Colace daily  ? Coronary artery disease   ? DDD (degenerative disc disease), cervical   ? DVT (deep venous thrombosis) ([place])   ? left peroneal vein 07/2017 (in setting of recent left TKA)  ? Dysrhythmia   ? history of Atrial fibrillation, no long has this  ? Enlarged prostate   ? GERD (gastroesophageal reflux disease)   ? takes Nexium daily  ? H/O hiatal hernia   ? History of colon polyps   ? benign  ? History of gastric ulcer   ? Insomnia   ? takes Xanax nightly  ? Joint capsule tear   ? left knee  ? Joint pain   ? Joint swelling   ? Macular degeneration   ? Muscle spasm of back   ? takes Valium daily as needed  ? PAF (paroxysmal atrial fibrillation) ([place])   ? Peripheral neuropathy   ? takes Gabapentin daily  ? PONV (postoperative nausea and vomiting)   ? Sleep apnea   ? pt does not wear a cpap  ? Urinary hesitancy   ? Weakness   ? numbness and tingling  ? ?Past Surgical History:  ?Procedure Laterality Date  ? ANTERIOR CERVICAL DECOMP/DISCECTOMY FUSION  ~ 1991  ? "took bone from hip"  ? ANTERIOR CERVICAL DECOMP/DISCECTOMY FUSION  X 2  ? "put plate in"  ? ANTERIOR CERVICAL DISCECTOMY  <11/2005  ?  5-6 and 4-5./notes 05/02/2011  ? APPENDECTOMY    ? BACK SURGERY    ? BIOPSY  05/30/2018  ? Procedure: BIOPSY;  Surgeon: [name], MD;  [facility];  Service: Endoscopy;;  esophagus  ? BIOPSY  01/09/2022  ? Procedure: BIOPSY;  Surgeon: Montez Morita, Quillian Quince, MD;  [facility]: [facility];  Service: Gastroenterology;;  ? CARDIAC CATHETERIZATION    ? 3-4 per pt  ? CARPAL TUNNEL RELEASE Bilateral 10/2005 - 11/2005  ? right-left [facility] 05/02/2011  ? CATARACT EXTRACTION W/PHACO Right 05/24/2014  ? Procedure: CATARACT EXTRACTION PHACO AND INTRAOCULAR LENS PLACEMENT (IOC);  Surgeon: [name], MD;  Location: [facility];  Service: Ophthalmology;  Laterality: Right;  CDE 6.74  ? CATARACT EXTRACTION W/PHACO Left 06/17/2014  ? Procedure: CATARACT EXTRACTION PHACO AND INTRAOCULAR LENS PLACEMENT (IOC);  Surgeon: [name], MD;  Location: [facility];  Service: Ophthalmology;  Laterality: Left;  CDE:4.65  ? COLONOSCOPY WITH ESOPHAGOGASTRODUODENOSCOPY (EGD) N/A 01/22/2013  ? Procedure: COLONOSCOPY WITH ESOPHAGOGASTRODUODENOSCOPY (EGD);  Surgeon: [name], MD;  [facility];  Service: Endoscopy;  Laterality: N/A;  140  ? COLONOSCOPY WITH PROPOFOL N/A 05/30/2018  ? diverticulosis and external hemorrhoids  ? CORONARY ARTERY BYPASS GRAFT  2020  ? ESOPHAGOGASTRODUODENOSCOPY (EGD) WITH PROPOFOL N/A 05/30/2018  ?  normal esophagus, presence of possible barretts (bx consistent with reflux changes, GAVE)  ? ESOPHAGOGASTRODUODENOSCOPY (EGD) WITH PROPOFOL N/A 01/09/2022  ? Procedure: ESOPHAGOGASTRODUODENOSCOPY (EGD) WITH PROPOFOL;  Surgeon: Harvel Quale, MD;  [facility];  Service: Gastroenterology;  Laterality: N/A;  305, moved up per Leigh Amm  ? EYE SURGERY Bilateral   ? "cleaned film w/laser; since my 1st cataract OR"  ? JOINT REPLACEMENT    ? LEFT HEART CATH AND CORONARY ANGIOGRAPHY N/A 02/16/2019  ? Procedure: LEFT HEART CATH AND CORONARY ANGIOGRAPHY;  Surgeon: Belva Crome, MD;  Location: [facility];  Service: Cardiovascular;  Laterality: N/A;  ? POSTERIOR LUMBAR FUSION    ? "put a plate in"  ?  QUADRICEPS TENDON REPAIR Left 07/01/2017  ? QUADRICEPS TENDON REPAIR Left 07/01/2017  ? Procedure: REPAIR QUADRICEP TENDON;  Surgeon: [name], MD;  Location: [facility];  Service: Orthopedics;  Laterality: Left;  ? SHOULDER ARTHROSCOPY Bilateral   ? SPINAL CORD STIMULATOR INSERTION N/A 09/23/2015  ? Procedure: LUMBAR SPINAL CORD STIMULATOR INSERTION;  Surgeon: [name], MD;  Location: [facility];  Service: Neurosurgery;  Laterality: N/A;  LUMBAR SPINAL CORD STIMULATOR INSERTION  ? TOTAL KNEE ARTHROPLASTY Left 06/17/2017  ? TOTAL KNEE ARTHROPLASTY Left 06/17/2017  ? Procedure: TOTAL KNEE ARTHROPLASTY;  Surgeon: [name], MD;  Location: [facility];  Service: Orthopedics;  Laterality: Left;  ? TOTAL KNEE ARTHROPLASTY Right 12/16/2017  ? Procedure: TOTAL KNEE ARTHROPLASTY;  Surgeon: [name], MD;  Location: [facility];  Service: Orthopedics;  Laterality: Right;  ? ? ?Home Medications:  ?Current Facility-Administered Medications  ?Medication Dose Route Frequency Provider Last Rate Last Admin  ? ceFAZolin (ANCEF) IVPB 2g/100 mL premix  2 g Intravenous 30 min Pre-Op Awa Bachicha, Candee Furbish, MD      ? ?Allergies:  ?Allergies  ?Allergen Reactions  ? Tylenol [Acetaminophen]   ?  Stomach pain   ? Morphine And Related Nausea And Vomiting  ? Propofol Nausea And Vomiting  ? ? ?No family history on file. ?Social History:  reports that he quit smoking about 35 years ago. His smoking use included cigarettes. He has a 30.00 pack-year smoking history. He has quit using smokeless tobacco.  His smokeless tobacco use included chew. He reports current alcohol use. He reports that he does not use drugs. ? ?Review of Systems  ?Genitourinary:  Positive for difficulty urinating.  ?All other systems reviewed and are negative. ? ?Physical Exam:  ?Vital signs in last 24 hours: ?Temp:  [97.6 ?F (36.4 ?C)] 97.6 ?F (36.4 ?C) (05/01 3474) ?Pulse Rate:  [43] 43 (05/01 0927) ?Resp:  [18] 18 (05/01 0927) ?BP: (141)/(63) 141/63 (05/01 2595) ?SpO2:  [96  %] 96 % (05/01 0927) ?Physical Exam ?Constitutional:   ?   Appearance: Normal appearance.  ?HENT:  ?   Head: Normocephalic and atraumatic.  ?   Nose: Nose normal. No congestion.  ?   Mouth/Throat:  ?   Mouth: Mucous membranes are dry.  ?Eyes:  ?   Extraocular Movements: Extraocular movements intact.  ?   Pupils: Pupils are equal, round, and reactive to light.  ?Cardiovascular:  ?   Rate and Rhythm: Normal rate and regular rhythm.  ?Pulmonary:  ?   Effort: Pulmonary effort is normal. No respiratory distress.  ?Abdominal:  ?   General: Abdomen is flat. There is no distension.  ?Musculoskeletal:     ?   General: Normal range of motion.  ?   Cervical back: Normal range of  motion and neck supple.  ?Skin: ?   General: Skin is warm and dry.  ?Neurological:  ?   General: No focal deficit present.  ?   Mental Status: He is alert and oriented to person, place, and time.  ?Psychiatric:     ?   Mood and Affect: Mood normal.     ?   Behavior: Behavior normal.     ?   Thought Content: Thought content normal.     ?   Judgment: Judgment normal.  ? ? ?Laboratory Data:  ?No results found for this or any previous visit (from the past 24 hour(s)). ?No results found for this or any previous visit (from the past 240 hour(s)). ?Creatinine: ?No results for input(s): CREATININE in the last 168 hours. ?Baseline Creatinine: unknown ? ?Impression/Assessment:  ?76yo with BPH with a weak urinary stream ? ?Plan:  ?We discussed the management of his BPH including continued medical therapy, Rezum, Urolift, TURP and simple prostatectomy. After discussing the options the patient has elected to proceed with Urolift. Risks/benefits/alternatives discussed. ? ? ?Nicolette Bang ?04/16/2022, 10:04 AM  ? ? ? ? ? ?

## 2022-04-16 NOTE — Anesthesia Postprocedure Evaluation (Signed)
Anesthesia Post Note ? ?Patient: Christopher [name] ? ?Procedure(s) Performed: CYSTOSCOPY WITH INSERTION OF UROLIFT (Bladder) ? ?Patient location during evaluation: Phase II ?Anesthesia Type: General ?Level of consciousness: awake ?Pain management: pain level controlled ?Vital Signs Assessment: post-procedure vital signs reviewed and stable ?Respiratory status: spontaneous breathing and respiratory function stable ?Cardiovascular status: blood pressure returned to baseline and stable ?Postop Assessment: no headache and no apparent nausea or vomiting ?Anesthetic complications: no ?Comments: Late entry ? ? ?No notable events documented. ? ? ?Last Vitals:  ?Vitals:  ? 04/16/22 1145 04/16/22 1155  ?BP:  113/63  ?Pulse: (!) 47 (!) 52  ?Resp: 15 18  ?Temp:  36.4 ?C  ?SpO2: 93% 100%  ?  ?Last Pain:  ?Vitals:  ? 04/16/22 1155  ?TempSrc: Oral  ?PainSc: 0-No pain  ? ? ?  ?  ?  ?  ?  ?  ? ?Louann Sjogren ? ? ? ? ?

## 2022-04-16 NOTE — Anesthesia Preprocedure Evaluation (Signed)
Anesthesia Evaluation  ?Patient identified by MRN, date of birth, ID band ?Patient awake ? ? ? ?Reviewed: ?Allergy & Precautions, H&P , NPO status , Patient's Chart, lab work & pertinent test results, reviewed documented beta blocker date and time  ? ?History of Anesthesia Complications ?(+) PONV and history of anesthetic complications ? ?Airway ?Mallampati: II ? ?TM Distance: >3 FB ?Neck ROM: full ? ? ? Dental ?no notable dental hx. ? ?  ?Pulmonary ?sleep apnea , former smoker,  ?  ?Pulmonary exam normal ?breath sounds clear to auscultation ? ? ? ? ? ? Cardiovascular ?Exercise Tolerance: Good ?+ CAD and + CABG  ?+ dysrhythmias Atrial Fibrillation  ?Rhythm:irregular Rate:Normal ? ? ?  ?Neuro/Psych ? Neuromuscular disease negative psych ROS  ? GI/Hepatic ?Neg liver ROS, hiatal hernia, GERD  Medicated,  ?Endo/Other  ?negative endocrine ROS ? Renal/GU ?negative Renal ROS  ?negative genitourinary ?  ?Musculoskeletal ? ? Abdominal ?  ?Peds ? Hematology ?negative hematology ROS ?(+)   ?Anesthesia Other Findings ? ? Reproductive/Obstetrics ?negative OB ROS ? ?  ? ? ? ? ? ? ? ? ? ? ? ? ? ?  ?  ? ? ? ? ? ? ? ? ?Anesthesia Physical ?Anesthesia Plan ? ?ASA: 3 ? ?Anesthesia Plan: General and General LMA  ? ?Post-op Pain Management:   ? ?Induction:  ? ?PONV Risk Score and Plan: Ondansetron ? ?Airway Management Planned:  ? ?Additional Equipment:  ? ?Intra-op Plan:  ? ?Post-operative Plan:  ? ?Informed Consent: I have reviewed the patients History and Physical, chart, labs and discussed the procedure including the risks, benefits and alternatives for the proposed anesthesia with the patient or authorized representative who has indicated his/her understanding and acceptance.  ? ? ? ?Dental Advisory Given ? ?Plan Discussed with: CRNA ? ?Anesthesia Plan Comments:   ? ? ? ? ? ? ?Anesthesia Quick Evaluation ? ?

## 2022-04-16 NOTE — Op Note (Signed)
? ?  PREOPERATIVE DIAGNOSIS:  Benign prostatic hypertrophy with bladder ?outlet obstruction. ? ?POSTOPERATIVE DIAGNOSIS:  Benign prostatic hypertrophy with bladder ?outlet obstruction. ? ?PROCEDURE:  Cystoscopy with implantation of UroLift devices, 5 implants. ? ?SURGEON:  Nicolette Bang, M.D. ? ?ANESTHESIA:  General ? ?ANTIBIOTICS: ancef ? ?SPECIMEN:  None. ? ?DRAINS:  A 20-French Foley catheter. ? ?BLOOD LOSS:  Minimal. ? ?COMPLICATIONS:  None. ? ?INDICATIONS: The Patient is an 76 year old male with BPH and ?bladder outlet obstruction.  He has failed medical therapy and has ?elected UroLift for definitive treatment. ? ?FINDINGS OF PROCEDURE:  He was taken to the operating room where a ?genral anesthetic was induced.  He was placed in ?lithotomy position and was fitted with PAS hose.  His perineum and ?genitalia were prepped with chlorhexidine, and he was draped in usual ?sterile fashion. ? ?Cystoscopy was performed using the UroLift scope and 0 degree lens. ?Examination revealed a normal urethra.  The external sphincter was ?intact.  Prostatic urethra was approximately 4 cm in length with lateral ?lobe enlargement. There was also little bit of bladder neck elevation. ?Inspection of bladder revealed mild-to-moderate trabeculation with no ?tumors, stones, or inflammation.  No cellules or diverticula were noted. ?Ureteral orifices were in their normal anatomic position effluxing clear ?urine. ? ?After initial cystoscopy, the visual obturator was replaced with the ?first UroLift device.  This was turned to the 9 o'clock position and ?pulled back to the veru and then slightly advanced.  Pressure was ?then applied to the right lateral lobe and the UroLift device was ?deployed. ? ?The second UroLift device was then inserted and applied to the left ?lateral lobe at 3 o'clock and deployed in the mid prostatic urethra. ?After this, there was still some apparent obstruction closer to the ?bladder neck.  So a second and  third level of UroLift your left device was applied ?between the mid urethra and the proximal urethra providing further ?patency to the prostatic urethra.  At this point, there was mild ?bleeding so it was ?thought that a Foley catheter was indicated.  The scope was removed and ?a 16-French Foley catheter was inserted without difficulty. The balloon ?was filled with 10 mL sterile fluid, and the catheter was placed to ?straight drainage. ? ?COMPLICATIONS: None  ? ?CONDITION: Stable, extubated, transferred to PACU ? ?PLAN: The patient will be discharged home and followup in 2 days for a voiding trial.  ? ?

## 2022-04-18 ENCOUNTER — Ambulatory Visit: Payer: Medicare Other | Admitting: Physician Assistant

## 2022-04-18 ENCOUNTER — Encounter (HOSPITAL_COMMUNITY): Payer: Self-pay | Admitting: Urology

## 2022-04-18 VITALS — BP 143/74 | HR 56 | Ht 68.0 in | Wt 160.0 lb

## 2022-04-18 DIAGNOSIS — N401 Enlarged prostate with lower urinary tract symptoms: Secondary | ICD-10-CM | POA: Diagnosis not present

## 2022-04-18 DIAGNOSIS — N138 Other obstructive and reflux uropathy: Secondary | ICD-10-CM

## 2022-04-18 NOTE — Progress Notes (Signed)
Fill and Pull Catheter Removal ? ?Patient is present today for a catheter removal.  Patient was cleaned and prepped in a sterile fashion 13m of sterile water/ saline was instilled into the bladder when the patient felt the urge to urinate. 131mof water was then drained from the balloon.  A 20FR foley cath was removed from the bladder no complications were noted .  Patient as then given some time to void on their own.  Patient can void  19063mn their own after some time.  Patient tolerated well. ? ?Performed by: KouLevi AlandMA ? ?Follow up/ Additional notes: Follow up as scheduled.   ? ? ? ?

## 2022-04-18 NOTE — Progress Notes (Signed)
? ?Assessment: ?1. Benign prostatic hyperplasia with urinary obstruction ?  ? ?Plan: ?Voiding trial successful today.  Follow-up in 2 weeks for UA and PVR and with Dr. [name] in 2 months for the same.  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  Postop care for UroLift information discussed and given in written form. ? ?Chief Complaint: ?No chief complaint on file. ? ? ?HPI: ?Christopher [name] is a 76 y.o. male who presents for continued evaluation of BPH with obstruction.  UroLift: Date of surgery 04/16/2022.  The patient is 2 days status post UroLift and states he has been doing well except for persistent urgency and distal penile burning.  No gross hematuria.  He has resumed his Eliquis. ? ? ?Portions of the above documentation were copied from a prior visit for review purposes only. ? ?Allergies: ?Allergies  ?Allergen Reactions  ? Tylenol [Acetaminophen]   ?  Stomach pain   ? Morphine And Related Nausea And Vomiting  ? Propofol Nausea And Vomiting  ? ? ?PMH: ?Past Medical History:  ?Diagnosis Date  ? A-fib ([place])   ? Arthritis   ? "all over my body" (06/17/2017)  ? BPH (benign prostatic hyperplasia)   ? takes Proscar daily  ? CAD (coronary artery disease)   ? a. s/p CABG in 09/2019 with SVG-PDA-OM and LIMA-LAD with LAA excision  ? Chronic back pain   ? DDD; "upper and lower" (06/17/2017)  ? Constipation   ? takes Colace daily  ? Coronary artery disease   ? DDD (degenerative disc disease), cervical   ? DVT (deep venous thrombosis) ([place])   ? left peroneal vein 07/2017 (in setting of recent left TKA)  ? Dysrhythmia   ? history of Atrial fibrillation, no long has this  ? Enlarged prostate   ? GERD (gastroesophageal reflux disease)   ? takes Nexium daily  ? H/O hiatal hernia   ? History of colon polyps   ? benign  ? History of gastric ulcer   ? Insomnia   ? takes Xanax nightly  ? Joint capsule tear   ? left  knee  ? Joint pain   ? Joint swelling   ? Macular degeneration   ? Muscle spasm of back   ? takes Valium daily as needed  ? PAF (paroxysmal atrial fibrillation) ([place])   ? Peripheral neuropathy   ? takes Gabapentin daily  ? PONV (postoperative nausea and vomiting)   ? Sleep apnea   ? pt does not wear a cpap  ? Urinary hesitancy   ? Weakness   ? numbness and tingling  ? ? ?PSH: ?Past Surgical History:  ?Procedure Laterality Date  ? ANTERIOR CERVICAL DECOMP/DISCECTOMY FUSION  ~ 1991  ? "took bone from hip"  ? ANTERIOR CERVICAL DECOMP/DISCECTOMY FUSION  X 2  ? "put plate in"  ? ANTERIOR CERVICAL DISCECTOMY  <11/2005  ?  5-6 and 4-5./notes 05/02/2011  ? APPENDECTOMY    ? BACK SURGERY    ? BIOPSY  05/30/2018  ? Procedure: BIOPSY;  Surgeon: [name], MD;  [facility];  Service: Endoscopy;;  esophagus  ? BIOPSY  01/09/2022  ? Procedure: BIOPSY;  Surgeon: Montez Morita, Quillian Quince, MD;  [facility];  Service: Gastroenterology;;  ? CARDIAC CATHETERIZATION    ? 3-4 per pt  ? CARPAL TUNNEL RELEASE Bilateral 10/2005 - 11/2005  ? right-left [facility] Endo 05/02/2011  ? CATARACT EXTRACTION  W/PHACO Right 05/24/2014  ? Procedure: CATARACT EXTRACTION PHACO AND INTRAOCULAR LENS PLACEMENT (IOC);  Surgeon: [name], MD;  Location: [facility];  Service: Ophthalmology;  Laterality: Right;  CDE 6.74  ? CATARACT EXTRACTION W/PHACO Left 06/17/2014  ? Procedure: CATARACT EXTRACTION PHACO AND INTRAOCULAR LENS PLACEMENT (IOC);  Surgeon: [name], MD;  Location: [facility];  Service: Ophthalmology;  Laterality: Left;  CDE:4.65  ? COLONOSCOPY WITH ESOPHAGOGASTRODUODENOSCOPY (EGD) N/A 01/22/2013  ? Procedure: COLONOSCOPY WITH ESOPHAGOGASTRODUODENOSCOPY (EGD);  Surgeon: [name], MD;  [facility];  Service: Endoscopy;  Laterality: N/A;  140  ? COLONOSCOPY WITH PROPOFOL N/A 05/30/2018  ? diverticulosis and external hemorrhoids  ? CORONARY ARTERY BYPASS GRAFT  2020  ? ESOPHAGOGASTRODUODENOSCOPY (EGD) WITH PROPOFOL  N/A 05/30/2018  ? normal esophagus, presence of possible barretts (bx consistent with reflux changes, GAVE)  ? ESOPHAGOGASTRODUODENOSCOPY (EGD) WITH PROPOFOL N/A 01/09/2022  ? Procedure: ESOPHAGOGASTRODUODENOSCOPY (EGD) WITH PROPOFOL;  Surgeon: Harvel Quale, MD;  [facility];  Service: Gastroenterology;  Laterality: N/A;  305, moved up per Leigh Amm  ? EYE SURGERY Bilateral   ? "cleaned film w/laser; since my 1st cataract OR"  ? JOINT REPLACEMENT    ? LEFT HEART CATH AND CORONARY ANGIOGRAPHY N/A 02/16/2019  ? Procedure: LEFT HEART CATH AND CORONARY ANGIOGRAPHY;  Surgeon: Belva Crome, MD;  Location: [facility];  Service: Cardiovascular;  Laterality: N/A;  ? POSTERIOR LUMBAR FUSION    ? "put a plate in"  ? QUADRICEPS TENDON REPAIR Left 07/01/2017  ? QUADRICEPS TENDON REPAIR Left 07/01/2017  ? Procedure: REPAIR QUADRICEP TENDON;  Surgeon: [name], MD;  Location: [facility];  Service: Orthopedics;  Laterality: Left;  ? SHOULDER ARTHROSCOPY Bilateral   ? SPINAL CORD STIMULATOR INSERTION N/A 09/23/2015  ? Procedure: LUMBAR SPINAL CORD STIMULATOR INSERTION;  Surgeon: [name], MD;  Location: [facility];  Service: Neurosurgery;  Laterality: N/A;  LUMBAR SPINAL CORD STIMULATOR INSERTION  ? TOTAL KNEE ARTHROPLASTY Left 06/17/2017  ? TOTAL KNEE ARTHROPLASTY Left 06/17/2017  ? Procedure: TOTAL KNEE ARTHROPLASTY;  Surgeon: [name], MD;  Location: [facility];  Service: Orthopedics;  Laterality: Left;  ? TOTAL KNEE ARTHROPLASTY Right 12/16/2017  ? Procedure: TOTAL KNEE ARTHROPLASTY;  Surgeon: [name], MD;  Location: [facility];  Service: Orthopedics;  Laterality: Right;  ? ? ?SH: ?Social History  ? ?Tobacco Use  ? Smoking status: Former  ?  Packs/day: 1.00  ?  Years: 30.00  ?  Pack years: 30.00  ?  Types: Cigarettes  ?  Quit date: 01/14/1987  ?  Years since quitting: 35.2  ? Smokeless tobacco: Former  ?  Types: Chew  ? Tobacco comments:  ?  quit smoking  & chewing by 1987  ?Vaping Use  ?  Vaping Use: Never used  ?Substance Use Topics  ? Alcohol use: Yes  ?  Comment: 2-3 beers per night  ? Drug use: No  ? ? ?ROS: ?See HPI ? ?PE: ?BP (!) 143/74   Pulse (!) 56   Ht '5\' 8"'$  (1.727 m)   Wt 160 lb (72.6 kg)   BMI 24.33 kg/m?  ?GENERAL APPEARANCE:  Well appearing, well developed, well nourished, NAD ?HEENT:  Atraumatic, normocephalic ?NECK:  Supple. Trachea midline ?ABDOMEN:  Soft, non-tender, no masses ?EXTREMITIES:  Without clubbing, cyanosis, or edema ?NEUROLOGIC:  Alert and oriented x 3 ?MENTAL STATUS:  appropriate ?SKIN:  Warm, dry, and intact ? ? ?Results: ?Laboratory Data: ?Lab Results  ?Component Value Date  ?  WBC 5.7 03/21/2022  ? HGB 13.1 03/21/2022  ? HCT 39.7 03/21/2022  ? MCV 94.3 03/21/2022  ? PLT 243 03/21/2022  ? ? ?Lab Results  ?Component Value Date  ? CREATININE 0.96 03/21/2022  ? ?Lab Results  ?Component Value Date  ? HGBA1C 5.6 12/31/2020  ? ? ?Urinalysis ?   ?Component Value Date/Time  ? [place] YELLOW 03/07/2014 2103  ? APPEARANCEUR Clear 03/16/2022 1045  ? LABSPEC 1.014 03/07/2014 2103  ? PHURINE 7.5 03/07/2014 2103  ? GLUCOSEU Negative 03/16/2022 1045  ? [place] NEGATIVE 03/07/2014 2103  ? BILIRUBINUR Negative 03/16/2022 1045  ? [place] NEGATIVE 03/07/2014 2103  ? PROTEINUR Negative 03/16/2022 1045  ? [place] NEGATIVE 03/07/2014 2103  ? UROBILINOGEN 0.2 03/07/2014 2103  ? NITRITE Negative 03/16/2022 1045  ? NITRITE NEGATIVE 03/07/2014 2103  ? LEUKOCYTESUR Negative 03/16/2022 1045  ? ? ?Lab Results  ?Component Value Date  ? LABMICR Comment 03/16/2022  ? ? ?Pertinent Imaging: ? ?Results for orders placed during the hospital encounter of 02/21/17 ? ?CT RENAL STONE STUDY ? ?Narrative ?CLINICAL DATA:  Right flank pain for 5 days, history of appendectomy ? ?EXAM: ?CT ABDOMEN AND PELVIS WITHOUT CONTRAST ? ?TECHNIQUE: ?Multidetector CT imaging of the abdomen and pelvis was performed ?following the standard protocol without IV contrast. ? ?COMPARISON:  None. ? ?FINDINGS: ?Lower chest:  Lung bases shows no acute findings. Partially ?visualized lower thoracic spinal canal stimulator wires ? ?Hepatobiliary: Unenhanced liver shows no biliary ductal dilatation. ?Question tiny noncalcified gal

## 2022-04-19 ENCOUNTER — Telehealth: Payer: Self-pay

## 2022-04-19 MED ORDER — CIPROFLOXACIN HCL 500 MG PO TABS: 500.0000 mg | ORAL_TABLET | Freq: Two times a day (BID) | ORAL | 0 refills | Status: DC

## 2022-04-19 NOTE — Telephone Encounter (Signed)
Wife called and left a voicemail 04-19-2022. ? ?Needing a call back asap. ?Patient is having chills and temp of 101.7 ?Had a UroLift done on Monday. ? ?Call back:   ?[phone] ?[phone] ? ?Thanks, ?Helene Kelp ? ? ?

## 2022-04-19 NOTE — Telephone Encounter (Signed)
Pharmacy called and cipro interacts with amiodarone, so I switched it to cefdinir 300 mg po bid x 7 days (leslie at [facility] said cefdinir does not interact).  ?

## 2022-04-19 NOTE — Telephone Encounter (Signed)
Returned call. Patient states that he took IBU and his temp was currently 99.  He states he no longer had the chills the way he did. Patient states he was sick on his stomach earlier but that had passed. Symptoms reviewed with Dr. Felipa Eth.  ? ?Per Dr. Felipa Eth patient to start cipro 500 bid x 7 days.  ?If fevers persist to got to ER for evaluation. ? ?Rx sent to pharmacy.  ?Pt instructed to pick up abt tonight and start taking.  ?Patient voiced understanding on going to ER if symptoms worsen or fever persist. ?

## 2022-04-23 ENCOUNTER — Encounter (HOSPITAL_COMMUNITY): Payer: Self-pay | Admitting: Neurosurgery

## 2022-04-23 ENCOUNTER — Other Ambulatory Visit: Payer: Self-pay

## 2022-04-23 DIAGNOSIS — E876 Hypokalemia: Secondary | ICD-10-CM | POA: Diagnosis not present

## 2022-04-23 NOTE — Progress Notes (Signed)
Spoke with Christopher [name] and his girlfriend, Phineas Semen for pre-op call. Christopher [name] has hx of CAD with CABG in 2020. Denies any recent chest pain or shortness of breath. Christopher [name] states he has hx of A-fib. Dr Lovena Le is his Electrophysiologist and Dr. Johnsie Cancel is his cardiologist. He states he was told to hold his Eliquis 3 days prior to surgery but was not told to stop his Aspirin. Last day of Eliquis was 04/22/22 PM dose. Christopher [name] states he has been told he was pre-diabetic. Last A1C in Epic was 5.6 on 12/31/20. ? ?Shower instructions given to Christopher [name] and girlfriend. ? ?Chart sent to Anesthesia PA ?

## 2022-04-24 NOTE — Progress Notes (Signed)
Anesthesia Chart Review: ?Same day workup ? ?Follows with cardiology for history of CAD (s/p CABG in 09/2019 with SVG-PDA-OM and LIMA-LAD with LAA excision), paroxysmal atrial fibrillation on amiodarone and Eliquis, OSA (intolerant to CPAP), HTN and HLD.  Last seen by Bernerd Pho, PA-C 04/04/2022 for preop evaluation.  Per note, "- While his activity has been more limited secondary to back pain, he denies any recent anginal symptoms and he is able to perform more than 4 METS of activity. His RCRI Risk is overall low at 0.9% risk of a major cardiac event and would not anticipate further cardiac testing prior to his upcoming surgeries.  - For revision of his spinal cord stimulator, it was recommended he hold both ASA and Eliquis for 3 days and this was approved. For his cystoscopy, it was recommended by Urology to see if he could hold Eliquis for 3 days prior and 2 days after the procedure and I reviewed with Pharmacy who sent this to Dr. Lovena Le as they must have his approval to hold beyond 3 days. Will communicate recommendations with the patient when I hear back and I will also verify with Urology if they were needing him to hold ASA as well (hopefully this can be continued)." ? ?Patient will need day of surgery labs and evaluation. ? ?EKG 04/05/2022: Sinus bradycardia with first-degree AV block.  Rate 56.  Possible inferior infarct, age undetermined. ? ?TTE 12/25/2019: ? 1. Left ventricular ejection fraction, by visual estimation, is 55 to  ?60%. The left ventricle has normal function. There is no left ventricular  ?hypertrophy.  ? 2. The left ventricle has no regional wall motion abnormalities.  ? 3. Global right ventricle has normal systolic function.The right  ?ventricular size is normal. No increase in right ventricular wall  ?thickness.  ? 4. Left atrial size was normal.  ? 5. Right atrial size was normal.  ? 6. The mitral valve is normal in structure. No evidence of mitral valve  ?regurgitation. No evidence  of mitral stenosis.  ? 7. The tricuspid valve is normal in structure.  ? 8. The aortic valve is normal in structure. Aortic valve regurgitation is  ?not visualized. No evidence of aortic valve sclerosis or stenosis.  ? 9. The pulmonic valve was normal in structure. Pulmonic valve  ?regurgitation is not visualized.  ?10. Normal pulmonary artery systolic pressure.  ?11. The inferior vena cava is normal in size with greater than 50%  ?respiratory variability, suggesting right atrial pressure of 3 mmHg.  ? ? ?Karoline Caldwell, PA-C ?[facility]/Anesthesiology ?Phone [phone] ?04/24/2022 12:49 PM ? ?

## 2022-04-24 NOTE — Anesthesia Preprocedure Evaluation (Addendum)
Anesthesia Evaluation  ?Patient identified by MRN, date of birth, ID band ?Patient awake ? ? ? ?Reviewed: ?Allergy & Precautions, NPO status , Patient's Chart, lab work & pertinent test results ? ?History of Anesthesia Complications ?(+) PONV and history of anesthetic complications ? ?Airway ?Mallampati: II ? ?TM Distance: >3 FB ?Neck ROM: Full ? ? ? Dental ?no notable dental hx. ? ?  ?Pulmonary ?neg pulmonary ROS, former smoker,  ?  ?Pulmonary exam normal ?breath sounds clear to auscultation ? ? ? ? ? ? Cardiovascular ?+ CAD, + CABG and + DOE  ?Normal cardiovascular exam+ dysrhythmias Atrial Fibrillation  ?Rhythm:Regular Rate:Normal ? ? ?  ?Neuro/Psych ?negative neurological ROS ? negative psych ROS  ? GI/Hepatic ?Neg liver ROS, GERD  ,  ?Endo/Other  ?negative endocrine ROS ? Renal/GU ?negative Renal ROS  ?negative genitourinary ?  ?Musculoskeletal ?negative musculoskeletal ROS ?(+)  ? Abdominal ?  ?Peds ?negative pediatric ROS ?(+)  Hematology ?negative hematology ROS ?(+)   ?Anesthesia Other Findings ? ? Reproductive/Obstetrics ?negative OB ROS ? ?  ? ? ? ? ? ? ? ? ? ? ? ? ? ?  ?  ? ? ? ? ? ? ? ?Anesthesia Physical ?Anesthesia Plan ? ?ASA: 3 ? ?Anesthesia Plan: General  ? ?Post-op Pain Management: Minimal or no pain anticipated  ? ?Induction: Intravenous ? ?PONV Risk Score and Plan: 3 and Ondansetron, Dexamethasone and Treatment may vary due to age or medical condition ? ?Airway Management Planned: Oral ETT ? ?Additional Equipment:  ? ?Intra-op Plan:  ? ?Post-operative Plan: Extubation in OR ? ?Informed Consent: I have reviewed the patients History and Physical, chart, labs and discussed the procedure including the risks, benefits and alternatives for the proposed anesthesia with the patient or authorized representative who has indicated his/her understanding and acceptance.  ? ? ? ?Dental advisory given ? ?Plan Discussed with: CRNA and Surgeon ? ?Anesthesia Plan Comments: (PAT  note by Karoline Caldwell, PA-C: ? ?Follows with cardiology for history of CAD (s/p CABG in 09/2019 with SVG-PDA-OM and LIMA-LAD with LAA excision), paroxysmal?atrial fibrillation on amiodarone and Eliquis, OSA (intolerant to CPAP), HTN and HLD.  Last seen by Bernerd Pho, PA-C 04/04/2022 for preop evaluation.  Per note, "- While his activity has been more limited secondary to back pain, he denies any recent anginal symptoms and he is able to perform more than 4 METS of activity. His RCRI Risk is overall low at 0.9% risk of a major cardiac event and would not anticipate further cardiac testing prior to his upcoming surgeries.  - For revision of his spinal cord stimulator, it was recommended he hold both ASA and Eliquis for 3 days and this was approved. For his cystoscopy, it was recommended by Urology to see if he could hold Eliquis for 3 days prior and 2 days after the procedure and I reviewed with Pharmacy who sent this to Dr. Lovena Le as they must have his approval to hold beyond 3 days. Will communicate recommendations with the patient when I hear back and I will also verify with Urology if they were needing him to hold ASA as well (hopefully this can be continued)." ? ?Patient will need day of surgery labs and evaluation. ? ?EKG 04/05/2022: Sinus bradycardia with first-degree AV block.  Rate 56.  Possible inferior infarct, age undetermined. ? ?TTE 12/25/2019: ??1. Left ventricular ejection fraction, by visual estimation, is 55 to  ?60%. The left ventricle has normal function. There is no left ventricular  ?hypertrophy.  ??2. The  left ventricle has no regional wall motion abnormalities.  ??3. Global right ventricle has normal systolic function.The right  ?ventricular size is normal. No increase in right ventricular wall  ?thickness.  ??4. Left atrial size was normal.  ??5. Right atrial size was normal.  ??6. The mitral valve is normal in structure. No evidence of mitral valve  ?regurgitation. No evidence of mitral  stenosis.  ??7. The tricuspid valve is normal in structure.  ??8. The aortic valve is normal in structure. Aortic valve regurgitation is  ?not visualized. No evidence of aortic valve sclerosis or stenosis.  ??9. The pulmonic valve was normal in structure. Pulmonic valve  ?regurgitation is not visualized.  ?10. Normal pulmonary artery systolic pressure.  ?11. The inferior vena cava is normal in size with greater than 50%  ?respiratory variability, suggesting right atrial pressure of 3 mmHg.  ? ?)  ? ? ? ? ? ?Anesthesia Quick Evaluation ? ?

## 2022-04-25 ENCOUNTER — Ambulatory Visit: Payer: Medicare Other | Admitting: Internal Medicine

## 2022-04-25 ENCOUNTER — Encounter (HOSPITAL_COMMUNITY)
Admission: RE | Disposition: A | Discharge status: Home / Self Care | Payer: Self-pay | Source: Home / Self Care | Attending: Neurosurgery

## 2022-04-25 ENCOUNTER — Other Ambulatory Visit: Payer: Self-pay

## 2022-04-25 ENCOUNTER — Ambulatory Visit (HOSPITAL_COMMUNITY): Payer: Medicare Other | Admitting: Certified Registered"

## 2022-04-25 ENCOUNTER — Ambulatory Visit (HOSPITAL_BASED_OUTPATIENT_CLINIC_OR_DEPARTMENT_OTHER): Payer: Medicare Other | Admitting: Certified Registered"

## 2022-04-25 ENCOUNTER — Ambulatory Visit (HOSPITAL_COMMUNITY): Payer: Medicare Other

## 2022-04-25 ENCOUNTER — Encounter (HOSPITAL_COMMUNITY): Payer: Self-pay | Admitting: Neurosurgery

## 2022-04-25 ENCOUNTER — Ambulatory Visit (HOSPITAL_COMMUNITY)
Admission: RE | Admit: 2022-04-25 | Discharge: 2022-04-25 | Disposition: A | Discharge status: Home / Self Care | Payer: Medicare Other | Attending: Neurosurgery | Admitting: Neurosurgery

## 2022-04-25 DIAGNOSIS — I4891 Unspecified atrial fibrillation: Secondary | ICD-10-CM | POA: Diagnosis not present

## 2022-04-25 DIAGNOSIS — I480 Paroxysmal atrial fibrillation: Secondary | ICD-10-CM | POA: Insufficient documentation

## 2022-04-25 DIAGNOSIS — R2689 Other abnormalities of gait and mobility: Secondary | ICD-10-CM | POA: Diagnosis not present

## 2022-04-25 DIAGNOSIS — G8929 Other chronic pain: Secondary | ICD-10-CM

## 2022-04-25 DIAGNOSIS — K219 Gastro-esophageal reflux disease without esophagitis: Secondary | ICD-10-CM | POA: Insufficient documentation

## 2022-04-25 DIAGNOSIS — T85192A Other mechanical complication of implanted electronic neurostimulator (electrode) of spinal cord, initial encounter: Secondary | ICD-10-CM | POA: Diagnosis present

## 2022-04-25 DIAGNOSIS — Z951 Presence of aortocoronary bypass graft: Secondary | ICD-10-CM | POA: Insufficient documentation

## 2022-04-25 DIAGNOSIS — Y831 Surgical operation with implant of artificial internal device as the cause of abnormal reaction of the patient, or of later complication, without mention of misadventure at the time of the procedure: Secondary | ICD-10-CM | POA: Diagnosis not present

## 2022-04-25 DIAGNOSIS — I2510 Atherosclerotic heart disease of native coronary artery without angina pectoris: Secondary | ICD-10-CM | POA: Diagnosis not present

## 2022-04-25 DIAGNOSIS — T85128A Displacement of other implanted electronic stimulator of nervous system, initial encounter: Secondary | ICD-10-CM | POA: Diagnosis not present

## 2022-04-25 DIAGNOSIS — Z87891 Personal history of nicotine dependence: Secondary | ICD-10-CM | POA: Diagnosis not present

## 2022-04-25 DIAGNOSIS — T85113A Breakdown (mechanical) of implanted electronic neurostimulator, generator, initial encounter: Secondary | ICD-10-CM | POA: Diagnosis not present

## 2022-04-25 DIAGNOSIS — M545 Low back pain, unspecified: Secondary | ICD-10-CM | POA: Insufficient documentation

## 2022-04-25 DIAGNOSIS — M549 Dorsalgia, unspecified: Secondary | ICD-10-CM

## 2022-04-25 HISTORY — PX: SPINAL CORD STIMULATOR INSERTION: SHX5378

## 2022-04-25 HISTORY — DX: Prediabetes: R73.03

## 2022-04-25 LAB — BASIC METABOLIC PANEL
Anion gap: 7 (ref 5–15)
BUN: 11 mg/dL (ref 8–23)
CO2: 24 mmol/L (ref 22–32)
Calcium: 8.9 mg/dL (ref 8.9–10.3)
Chloride: 112 mmol/L — ABNORMAL HIGH (ref 98–111)
Creatinine, Ser: 0.8 mg/dL (ref 0.61–1.24)
GFR, Estimated: >60 mL/min (ref >60)
Glucose, Bld: 118 mg/dL — ABNORMAL HIGH (ref 70–99)
Potassium: 3.7 mmol/L (ref 3.5–5.1)
Sodium: 143 mmol/L (ref 135–145)

## 2022-04-25 LAB — SURGICAL PCR SCREEN
MRSA, PCR: NEGATIVE
Staphylococcus aureus: NEGATIVE

## 2022-04-25 LAB — CBC
HCT: 36.3 % — ABNORMAL LOW (ref 39.0–52.0)
Hemoglobin: 12 g/dL — ABNORMAL LOW (ref 13.0–17.0)
MCH: 31.1 pg (ref 26.0–34.0)
MCHC: 33.1 g/dL (ref 30.0–36.0)
MCV: 94 fL (ref 80.0–100.0)
Platelets: 293 10*3/uL (ref 150–400)
RBC: 3.86 MIL/uL — ABNORMAL LOW (ref 4.22–5.81)
RDW: 13.2 % (ref 11.5–15.5)
WBC: 8.5 10*3/uL (ref 4.0–10.5)
nRBC: 0 % (ref 0.0–0.2)

## 2022-04-25 SURGERY — INSERTION, SPINAL CORD STIMULATOR, LUMBAR
Anesthesia: General

## 2022-04-25 MED ORDER — OXYCODONE HCL 5 MG PO TABS: 5.0000 mg | ORAL_TABLET | ORAL | Status: DC | PRN

## 2022-04-25 MED ORDER — EPHEDRINE SULFATE (PRESSORS) 50 MG/ML IJ SOLN
INTRAMUSCULAR | Status: DC | PRN
  Administered 2022-04-25 (×2): 5 mg via INTRAVENOUS
  Administered 2022-04-25: 10 mg via INTRAVENOUS

## 2022-04-25 MED ORDER — HYDROMORPHONE HCL 1 MG/ML IJ SOLN
INTRAMUSCULAR | Status: AC
  Filled 2022-04-25: qty 1

## 2022-04-25 MED ORDER — ONDANSETRON HCL 4 MG/2ML IJ SOLN: 4.0000 mg | Freq: Once | INTRAMUSCULAR | Status: DC | PRN

## 2022-04-25 MED ORDER — PROPOFOL 10 MG/ML IV BOLUS
INTRAVENOUS | Status: DC | PRN
  Administered 2022-04-25: 100 mg via INTRAVENOUS

## 2022-04-25 MED ORDER — 0.9 % SODIUM CHLORIDE (POUR BTL) OPTIME
TOPICAL | Status: DC | PRN
  Administered 2022-04-25: 1000 mL

## 2022-04-25 MED ORDER — NITROGLYCERIN 0.4 MG SL SUBL: 0.4000 mg | SUBLINGUAL_TABLET | SUBLINGUAL | Status: DC | PRN

## 2022-04-25 MED ORDER — FUROSEMIDE 20 MG PO TABS
20.0000 mg | ORAL_TABLET | Freq: Every day | ORAL | Status: DC
  Administered 2022-04-25: 20 mg via ORAL
  Filled 2022-04-25: qty 1

## 2022-04-25 MED ORDER — PROPOFOL 10 MG/ML IV BOLUS
INTRAVENOUS | Status: AC
  Filled 2022-04-25: qty 20

## 2022-04-25 MED ORDER — SODIUM CHLORIDE 0.9% FLUSH
3.0000 mL | Freq: Two times a day (BID) | INTRAVENOUS | Status: DC
  Administered 2022-04-25: 3 mL via INTRAVENOUS

## 2022-04-25 MED ORDER — ONDANSETRON HCL 4 MG/2ML IJ SOLN
INTRAMUSCULAR | Status: DC | PRN
  Administered 2022-04-25: 4 mg via INTRAVENOUS

## 2022-04-25 MED ORDER — BACITRACIN ZINC 500 UNIT/GM EX OINT
TOPICAL_OINTMENT | CUTANEOUS | Status: AC
  Filled 2022-04-25: qty 28.35

## 2022-04-25 MED ORDER — GABAPENTIN 400 MG PO CAPS
400.0000 mg | ORAL_CAPSULE | Freq: Three times a day (TID) | ORAL | Status: DC
  Administered 2022-04-25: 400 mg via ORAL
  Filled 2022-04-25: qty 1

## 2022-04-25 MED ORDER — LACTATED RINGERS IV SOLN: INTRAVENOUS | Status: DC

## 2022-04-25 MED ORDER — TAMSULOSIN HCL 0.4 MG PO CAPS
0.4000 mg | ORAL_CAPSULE | Freq: Every day | ORAL | Status: DC
Start: 2022-04-25 — End: 2022-04-25
  Administered 2022-04-25: 0.4 mg via ORAL
  Filled 2022-04-25: qty 1

## 2022-04-25 MED ORDER — CEFAZOLIN SODIUM-DEXTROSE 2-4 GM/100ML-% IV SOLN: 2.0000 g | Freq: Three times a day (TID) | INTRAVENOUS | Status: DC

## 2022-04-25 MED ORDER — LINACLOTIDE 72 MCG PO CAPS
72.0000 ug | ORAL_CAPSULE | Freq: Every day | ORAL | Status: DC
  Filled 2022-04-25: qty 1

## 2022-04-25 MED ORDER — SUGAMMADEX SODIUM 200 MG/2ML IV SOLN
INTRAVENOUS | Status: DC | PRN
  Administered 2022-04-25: 150 mg via INTRAVENOUS

## 2022-04-25 MED ORDER — PHENOL 1.4 % MT LIQD: 1.0000 | OROMUCOSAL | Status: DC | PRN

## 2022-04-25 MED ORDER — ATORVASTATIN CALCIUM 80 MG PO TABS
80.0000 mg | ORAL_TABLET | Freq: Every day | ORAL | Status: DC
  Administered 2022-04-25: 80 mg via ORAL
  Filled 2022-04-25: qty 1

## 2022-04-25 MED ORDER — APIXABAN 5 MG PO TABS: 5.0000 mg | ORAL_TABLET | Freq: Two times a day (BID) | ORAL | Status: AC

## 2022-04-25 MED ORDER — MENTHOL 3 MG MT LOZG: 1.0000 | LOZENGE | OROMUCOSAL | Status: DC | PRN

## 2022-04-25 MED ORDER — BUPIVACAINE-EPINEPHRINE (PF) 0.5% -1:200000 IJ SOLN
INTRAMUSCULAR | Status: DC | PRN
  Administered 2022-04-25: 10 mL via PERINEURAL

## 2022-04-25 MED ORDER — ONDANSETRON HCL 4 MG PO TABS: 4.0000 mg | ORAL_TABLET | Freq: Four times a day (QID) | ORAL | Status: DC | PRN

## 2022-04-25 MED ORDER — ONDANSETRON HCL 4 MG/2ML IJ SOLN: 4.0000 mg | Freq: Four times a day (QID) | INTRAMUSCULAR | Status: DC | PRN

## 2022-04-25 MED ORDER — ALPRAZOLAM 0.5 MG PO TABS: 1.0000 mg | ORAL_TABLET | Freq: Every day | ORAL | Status: DC

## 2022-04-25 MED ORDER — LACTATED RINGERS IV SOLN: INTRAVENOUS | Status: DC | PRN

## 2022-04-25 MED ORDER — POTASSIUM CHLORIDE CRYS ER 10 MEQ PO TBCR
10.0000 meq | EXTENDED_RELEASE_TABLET | Freq: Every day | ORAL | Status: DC
  Administered 2022-04-25: 10 meq via ORAL
  Filled 2022-04-25: qty 1

## 2022-04-25 MED ORDER — OXYCODONE HCL 5 MG PO TABS: 10.0000 mg | ORAL_TABLET | ORAL | Status: DC | PRN

## 2022-04-25 MED ORDER — OXYCODONE HCL 5 MG PO TABS: 5.0000 mg | ORAL_TABLET | Freq: Once | ORAL | Status: DC | PRN

## 2022-04-25 MED ORDER — SODIUM CHLORIDE 0.9 % IV SOLN
250.0000 mL | INTRAVENOUS | Status: DC
  Administered 2022-04-25: 250 mL via INTRAVENOUS

## 2022-04-25 MED ORDER — PANTOPRAZOLE SODIUM 40 MG PO TBEC
40.0000 mg | DELAYED_RELEASE_TABLET | Freq: Two times a day (BID) | ORAL | Status: DC
  Administered 2022-04-25: 40 mg via ORAL
  Filled 2022-04-25: qty 1

## 2022-04-25 MED ORDER — AMIODARONE HCL 200 MG PO TABS
200.0000 mg | ORAL_TABLET | Freq: Every day | ORAL | Status: DC
  Filled 2022-04-25: qty 1

## 2022-04-25 MED ORDER — FENTANYL CITRATE (PF) 250 MCG/5ML IJ SOLN
INTRAMUSCULAR | Status: DC | PRN
Start: 2022-04-25 — End: 2022-04-25
  Administered 2022-04-25: 50 ug via INTRAVENOUS

## 2022-04-25 MED ORDER — BUPIVACAINE-EPINEPHRINE 0.5% -1:200000 IJ SOLN
INTRAMUSCULAR | Status: AC
  Filled 2022-04-25: qty 1

## 2022-04-25 MED ORDER — LIDOCAINE 2% (20 MG/ML) 5 ML SYRINGE
INTRAMUSCULAR | Status: DC | PRN
  Administered 2022-04-25: 100 mg via INTRAVENOUS

## 2022-04-25 MED ORDER — TRAMADOL HCL 50 MG PO TABS: 50.0000 mg | ORAL_TABLET | ORAL | Status: DC | PRN

## 2022-04-25 MED ORDER — SODIUM CHLORIDE 0.9% FLUSH: 3.0000 mL | INTRAVENOUS | Status: DC | PRN

## 2022-04-25 MED ORDER — FENTANYL CITRATE (PF) 250 MCG/5ML IJ SOLN
INTRAMUSCULAR | Status: AC
  Filled 2022-04-25: qty 5

## 2022-04-25 MED ORDER — PROPYLENE GLYCOL 0.6 % OP SOLN: 1.0000 [drp] | Freq: Every day | OPHTHALMIC | Status: DC | PRN

## 2022-04-25 MED ORDER — THROMBIN 5000 UNITS EX SOLR
CUTANEOUS | Status: AC
  Filled 2022-04-25: qty 5000

## 2022-04-25 MED ORDER — OXYCODONE HCL 5 MG/5ML PO SOLN: 5.0000 mg | Freq: Once | ORAL | Status: DC | PRN

## 2022-04-25 MED ORDER — ORAL CARE MOUTH RINSE: 15.0000 mL | Freq: Once | OROMUCOSAL | Status: AC

## 2022-04-25 MED ORDER — ROCURONIUM BROMIDE 10 MG/ML (PF) SYRINGE
PREFILLED_SYRINGE | INTRAVENOUS | Status: DC | PRN
  Administered 2022-04-25: 70 mg via INTRAVENOUS
  Administered 2022-04-25: 10 mg via INTRAVENOUS

## 2022-04-25 MED ORDER — GELATIN ABSORBABLE MT POWD
OROMUCOSAL | Status: DC | PRN
  Administered 2022-04-25: 5 mL via TOPICAL

## 2022-04-25 MED ORDER — FINASTERIDE 5 MG PO TABS
5.0000 mg | ORAL_TABLET | Freq: Every day | ORAL | Status: DC
Start: 2022-04-25 — End: 2022-04-25
  Administered 2022-04-25: 5 mg via ORAL
  Filled 2022-04-25: qty 1

## 2022-04-25 MED ORDER — CEFAZOLIN SODIUM-DEXTROSE 2-3 GM-%(50ML) IV SOLR
INTRAVENOUS | Status: DC | PRN
  Administered 2022-04-25: 2 g via INTRAVENOUS

## 2022-04-25 MED ORDER — HYDROMORPHONE HCL 1 MG/ML IJ SOLN
0.2500 mg | INTRAMUSCULAR | Status: DC | PRN
  Administered 2022-04-25: 0.25 mg via INTRAVENOUS

## 2022-04-25 MED ORDER — CHLORHEXIDINE GLUCONATE 0.12 % MT SOLN
15.0000 mL | Freq: Once | OROMUCOSAL | Status: AC
  Administered 2022-04-25: 15 mL via OROMUCOSAL
  Filled 2022-04-25: qty 15

## 2022-04-25 MED ORDER — TRAMADOL HCL 50 MG PO TABS: 50.0000 mg | ORAL_TABLET | ORAL | 0 refills | Status: AC | PRN

## 2022-04-25 MED ORDER — PHENYLEPHRINE HCL-NACL 20-0.9 MG/250ML-% IV SOLN
INTRAVENOUS | Status: DC | PRN
  Administered 2022-04-25: 15 ug/min via INTRAVENOUS

## 2022-04-25 MED ORDER — DOCUSATE SODIUM 100 MG PO CAPS: 200.0000 mg | ORAL_CAPSULE | Freq: Every day | ORAL | Status: DC

## 2022-04-25 MED ORDER — VANCOMYCIN HCL 1000 MG IV SOLR
INTRAVENOUS | Status: AC
  Filled 2022-04-25: qty 20

## 2022-04-25 SURGICAL SUPPLY — 57 items
BAG COUNTER SPONGE SURGICOUNT (BAG) ×2 IMPLANT
BENZOIN TINCTURE PRP APPL 2/3 (GAUZE/BANDAGES/DRESSINGS) ×2 IMPLANT
BLADE CLIPPER SURG (BLADE) IMPLANT
BUR MATCHSTICK NEURO 3.0 LAGG (BURR) ×1 IMPLANT
BUR PRECISION FLUTE 6.0 (BURR) ×2 IMPLANT
CARTRIDGE OIL MAESTRO DRILL (MISCELLANEOUS) ×1 IMPLANT
CONTROL REMOTE FREELINK ALPHA (NEUROSURGERY SUPPLIES) ×1 IMPLANT
DERMABOND ADVANCED (GAUZE/BANDAGES/DRESSINGS) ×2
DERMABOND ADVANCED .7 DNX12 (GAUZE/BANDAGES/DRESSINGS) IMPLANT
DEVICE DISSECT PLASMABLAD 3.0S (MISCELLANEOUS) IMPLANT
DIFFUSER DRILL AIR PNEUMATIC (MISCELLANEOUS) ×1 IMPLANT
DRAPE C-ARM 42X72 X-RAY (DRAPES) ×2 IMPLANT
DRAPE INCISE IOBAN 85X60 (DRAPES) IMPLANT
DRAPE LAPAROTOMY 100X72X124 (DRAPES) ×2 IMPLANT
DRAPE SURG 17X23 STRL (DRAPES) ×4 IMPLANT
DRSG OPSITE POSTOP 4X6 (GAUZE/BANDAGES/DRESSINGS) ×1 IMPLANT
ELECT BLADE 4.0 EZ CLEAN MEGAD (MISCELLANEOUS) ×2
ELECT REM PT RETURN 9FT ADLT (ELECTROSURGICAL) ×2
ELECTRODE BLDE 4.0 EZ CLN MEGD (MISCELLANEOUS) ×1 IMPLANT
ELECTRODE REM PT RTRN 9FT ADLT (ELECTROSURGICAL) ×1 IMPLANT
GAUZE 4X4 16PLY ~~LOC~~+RFID DBL (SPONGE) IMPLANT
GLOVE BIO SURGEON STRL SZ8 (GLOVE) ×2 IMPLANT
GLOVE BIO SURGEON STRL SZ8.5 (GLOVE) ×2 IMPLANT
GLOVE EXAM NITRILE XL STR (GLOVE) IMPLANT
GLOVE SURG POLYISO LF SZ6 (GLOVE) ×2 IMPLANT
GLOVE SURG SS PI 6.5 STRL IVOR (GLOVE) ×3 IMPLANT
GLOVE SURG UNDER POLY LF SZ6.5 (GLOVE) ×2 IMPLANT
GOWN STRL REUS W/ TWL LRG LVL3 (GOWN DISPOSABLE) IMPLANT
GOWN STRL REUS W/ TWL XL LVL3 (GOWN DISPOSABLE) IMPLANT
GOWN STRL REUS W/TWL 2XL LVL3 (GOWN DISPOSABLE) IMPLANT
GOWN STRL REUS W/TWL LRG LVL3 (GOWN DISPOSABLE) ×4
GOWN STRL REUS W/TWL XL LVL3 (GOWN DISPOSABLE) ×2
HEMOSTAT POWDER KIT SURGIFOAM (HEMOSTASIS) ×2 IMPLANT
KIT BASIN OR (CUSTOM PROCEDURE TRAY) ×2 IMPLANT
KIT CHARGING (KITS) ×1
KIT CHARGING PRECISION NEURO (KITS) IMPLANT
KIT IPG ALPHA WAVEWRITER (Stimulator) ×1 IMPLANT
KIT TURNOVER KIT B (KITS) ×2 IMPLANT
NEEDLE HYPO 22GX1.5 SAFETY (NEEDLE) ×2 IMPLANT
NS IRRIG 1000ML POUR BTL (IV SOLUTION) ×2 IMPLANT
OIL CARTRIDGE MAESTRO DRILL (MISCELLANEOUS)
PACK LAMINECTOMY NEURO (CUSTOM PROCEDURE TRAY) ×2 IMPLANT
PAD ARMBOARD 7.5X6 YLW CONV (MISCELLANEOUS) ×2 IMPLANT
PLASMABLADE 3.0S (MISCELLANEOUS) ×2
SPONGE T-LAP 4X18 ~~LOC~~+RFID (SPONGE) IMPLANT
STAPLER SKIN PROX WIDE 3.9 (STAPLE) IMPLANT
STRIP CLOSURE SKIN 1/2X4 (GAUZE/BANDAGES/DRESSINGS) ×3 IMPLANT
SUT SILK 0 (SUTURE) ×2
SUT SILK 0 MO-6 18XCR BRD 8 (SUTURE) IMPLANT
SUT SILK 0 TIES 10X30 (SUTURE) IMPLANT
SUT SILK 2 0 PERMA HAND 18 BK (SUTURE) ×2 IMPLANT
SUT VIC AB 1 CT1 18XBRD ANBCTR (SUTURE) ×1 IMPLANT
SUT VIC AB 1 CT1 8-18 (SUTURE) ×2
SUT VIC AB 2-0 CP2 18 (SUTURE) ×3 IMPLANT
TOWEL GREEN STERILE (TOWEL DISPOSABLE) ×2 IMPLANT
TOWEL GREEN STERILE FF (TOWEL DISPOSABLE) ×2 IMPLANT
WATER STERILE IRR 1000ML POUR (IV SOLUTION) ×2 IMPLANT

## 2022-04-25 NOTE — Evaluation (Signed)
Occupational Therapy Evaluation ?Patient Details ?Name: Christopher [name] ?MRN: [id number] ?DOB: Aug 26, 1946 ?Today's Date: 04/25/2022 ? ? ?History of Present Illness The patient is a 76 year old white male who underwent evision/replacement of [employer] spinal cord stimulator that was placed years ago by another physician. PMHx: A-fib, BPH, CAD, CAD, multiple back surgeries  ? ?Clinical Impression ?  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 Upon evaluation pt demonstrated mod I bed mobility and transfers, and supervision ADLs. He required increased time, and moved a little guarded for pain management due to expected surgical pain - no AD needed, no LOB. Pt has great recall of spinal precautions from his previous surgeries. He is likely at his functional baseline. Pt does not require further OT acutely, or post acute. Recommend d/c home with support fo family.  ?   ? ?Recommendations for follow up therapy are one component of a multi-disciplinary discharge planning process, led by the attending physician.  Recommendations may be updated based on patient status, additional functional criteria and insurance authorization.  ? ?Follow Up Recommendations ? No OT follow up  ?  ?Assistance Recommended at Discharge Intermittent Supervision/Assistance  ?Patient can return home with the following A little help with walking and/or transfers;A little help with bathing/dressing/bathroom;Assist for transportation ? ?  ?Functional Status Assessment ? Patient has had a recent decline in their functional status and demonstrates the ability to make significant improvements in function in a reasonable and predictable amount of time.  ?Equipment Recommendations ? None recommended by OT  ?  ?Recommendations for Other Services   ? ? ?  ?Precautions / Restrictions Precautions ?Precautions: Fall;Back ?Precaution Booklet Issued: No ?Precaution Comments:  reviewed for comfort ?Restrictions ?Weight Bearing Restrictions: No  ? ?  ? ?Mobility Bed Mobility ?Overal bed mobility: Needs Assistance ?Bed Mobility: Rolling, Sidelying to Sit, Sit to Sidelying ?Rolling: Modified independent (Device/Increase time) ?Sidelying to sit: Modified independent (Device/Increase time) ?  ?  ?Sit to sidelying: Modified independent (Device/Increase time) ?  ?  ? ?Transfers ?Overall transfer level: Needs assistance ?Equipment used: None ?Transfers: Sit to/from Stand ?Sit to Stand: Modified independent (Device/Increase time) ?  ?  ?  ?  ?  ?  ?  ? ?  ?Balance Overall balance assessment: No apparent balance deficits (not formally assessed) ?  ?  ?  ?  ?  ?  ?  ?  ?  ?  ?  ?  ?  ?  ?  ?  ?  ?  ?   ? ?ADL either performed or assessed with clinical judgement  ? ?ADL Overall ADL's : Needs assistance/impaired ?  ?  ?  ?  ?  ?  ?  ?  ?  ?  ?  ?  ?  ?  ?  ?  ?  ?  ?Functional mobility during ADLs: Supervision/safety ?General ADL Comments: Supervision for all ADLs this date, pt moving a little slow and guarded due to expected back pain s/p surgery. no AD needed, no LOB. Reviewed compensatory techniques for comfort to maintain back precautions  ? ? ? ?Vision Baseline Vision/History: 1 Wears glasses ?Ability to See in Adequate Light: 0 Adequate ?Patient Visual Report: No change from baseline ?Vision Assessment?: No apparent visual deficits  ?   ?Perception   ?  ?Praxis   ?  ? ?Pertinent Vitals/Pain Pain Assessment ?Pain Assessment: Faces ?Faces Pain Scale: Hurts little more ?Pain Location:  sx site ?Pain Descriptors / Indicators: Discomfort, Grimacing ?Pain Intervention(s): Limited activity within patient's tolerance, Monitored during session  ? ? ? ?Hand Dominance Right ?  ?Extremity/Trunk Assessment Upper Extremity Assessment ?Upper Extremity Assessment: Overall WFL for tasks assessed ?  ?Lower Extremity Assessment ?Lower Extremity Assessment: Overall WFL for tasks assessed ?  ?Cervical / Trunk  Assessment ?Cervical / Trunk Assessment: Back Surgery ?  ?Communication Communication ?Communication: No difficulties ?  ?Cognition Arousal/Alertness: Awake/alert ?Behavior During Therapy: [facility] for tasks assessed/performed ?Overall Cognitive Status: Within Functional Limits for tasks assessed ?  ?  ?  ?  ?  ?  ?  ?  ?  ?  ?  ?  ?  ?  ?  ?  ?  ?  ?  ?General Comments  VSS on RA, wife present and supportive ? ?  ?Exercises   ?  ?Shoulder Instructions    ? ? ?Home Living Family/patient expects to be discharged to:: Private residence ?Living Arrangements: Spouse/significant other ?Available Help at Discharge: Family ?Type of Home: House ?Home Access: Ramped entrance ?  ?  ?Home Layout: One level ?  ?  ?Bathroom Shower/Tub: Tub/shower unit;Walk-in shower ?  ?Bathroom Toilet: Handicapped height ?  ?  ?Home Equipment: Conservation officer, nature (2 wheels);Cane - single point ?  ?  ?  ? ?  ?Prior Functioning/Environment Prior Level of Function : Independent/Modified Independent;Working/employed;Driving ?  ?  ?  ?  ?  ?  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  ? ?  ?  ?OT Problem List: Decreased activity tolerance;Impaired balance (sitting and/or standing);Decreased knowledge of precautions;Pain ?  ?   ?OT Treatment/Interventions:    ?  ?OT Goals(Current goals can be found in the care plan section) Acute Rehab OT Goals ?Patient Stated Goal: home ?OT Goal Formulation: With patient ?Time For Goal Achievement: 05/09/22 ?Potential to Achieve Goals: Good  ?OT Frequency:   ?  ? ?Co-evaluation   ?  ?  ?  ?  ? ?  ?AM-PAC OT "6 Clicks" Daily Activity     ?Outcome Measure Help from another person eating meals?: None ?Help from another person taking care of personal grooming?: None ?Help from another person toileting, which includes using toliet, bedpan, or urinal?: None ?Help from another person bathing (including washing, rinsing, drying)?: None ?Help from another person to put  on and taking off regular upper body clothing?: None ?Help from another person to put on and taking off regular lower body clothing?: None ?6 Click Score: 24 ?  ?End of Session Nurse Communication: Mobility status ? ?Activity Tolerance: Patient tolerated treatment well ?Patient left: in bed;with call bell/phone within reach;with family/visitor present ? ?OT Visit Diagnosis: Pain  ?              ?Time: 3614-4315 ?OT Time Calculation (min): 16 min ?Charges:  OT General Charges ?$OT Visit: 1 Visit ?OT Evaluation ?$OT Eval Low Complexity: 1 Low ? ? ?Lean Fayson A Eldar Robitaille ?04/25/2022, 12:54 PM ?

## 2022-04-25 NOTE — Op Note (Signed)
Brief history: The patient is a 76 year old white male with a history of prior back surgeries and chronic back pain.  He had a spinal cord stimulator placed by another physician years ago.  The battery is malfunctioning.  We discussed the various treatment options.  He has decided proceed with a replacement/revision of his spinal cord stimulator. ? ?Preop diagnosis: Chronic back pain, spinal cord stimulator malfunction ? ?Postop diagnosis: The same ? ?Procedure: Revision/replacement of [employer] spinal cord stimulator ? ?Surgeon: Dr. Earle Gell ? ?Assistant: Arnetha Massy, NP ? ?Anesthesia: General endotracheal ? ?Estimated blood loss: Minimal ? ?Specimens: None ? ?Drains: None ? ?Complications: None ? ?Description of procedure: The patient was brought to the operating room by the anesthesia team.  General endotracheal anesthesia was induced.  He was turned to the prone position on the [place] frame.  His thoracolumbar region was then prepared with Betadine scrub and Betadine solution.  Sterile drapes were applied.  I then injected the area to be incised with Marcaine with epinephrine solution.  I made a midline incision over the patient's prominent spinal cord stimulator lead.  We used the plasma knife to expose the leads.  I freed up the leads and tacked the tiedown device to the fascia using a figure-of-eight 0 silk suture.  I then reapproximated patient's subcutaneous tissue with interrupted 2-0 Vicryl.  I reapproximated the skin with Steri-Strips and benzoin. ? ?I then made a second incision medial to the patient's spinal cord stimulator in his left flank.  I used the plasma knife to expose the stimulator.  We then removed it and remove the leads and connected them to the new spinal cord stimulator.  We confirmed via telemetry and the leads were functioning properly.  We then replaced the spinal cord stimulator in the subcutaneous pocket placing the wires behind the stimulator.  I then reapproximated  the subcutaneous tissue with interrupted 2-0 Vicryl suture.  The skin was reapproximated with Steri-Strips and benzoin.  The wounds were then coated with bacitracin ointment.  Sterile dressings were applied to the wounds. ? ?By report all sponge, instrument, and needle counts were correct at the end this case. ?

## 2022-04-25 NOTE — Anesthesia Procedure Notes (Signed)
Procedure Name: Intubation ?Date/Time: 04/25/2022 8:49 AM ?Performed by: Glynda Jaeger, CRNA ?Pre-anesthesia Checklist: Patient identified, Patient being monitored, Timeout performed, Emergency Drugs available and Suction available ?Patient Re-evaluated:Patient Re-evaluated prior to induction ?Oxygen Delivery Method: Circle System Utilized ?Preoxygenation: Pre-oxygenation with 100% oxygen ?Induction Type: IV induction ?Ventilation: Mask ventilation without difficulty ?Laryngoscope Size: Mac, 3 and 4 ?Grade View: Grade I ?Tube type: Oral ?Tube size: 7.5 mm ?Number of attempts: 1 ?Airway Equipment and Method: Stylet ?Placement Confirmation: ETT inserted through vocal cords under direct vision, positive ETCO2 and breath sounds checked- equal and bilateral ?Secured at: 23 cm ?Tube secured with: Tape ?Dental Injury: Teeth and Oropharynx as per pre-operative assessment  ? ? ? ? ?

## 2022-04-25 NOTE — Plan of Care (Signed)
?  Problem: Safety: ?Goal: Ability to remain free from injury will improve ?Outcome: Completed/Met ?  ?Problem: Education: ?Goal: Ability to verbalize activity precautions or restrictions will improve ?Outcome: Completed/Met ?Goal: Knowledge of the prescribed therapeutic regimen will improve ?Outcome: Completed/Met ?Goal: Understanding of discharge needs will improve ?Outcome: Completed/Met ?  ?Problem: Activity: ?Goal: Ability to avoid complications of mobility impairment will improve ?Outcome: Completed/Met ?Goal: Ability to tolerate increased activity will improve ?Outcome: Completed/Met ?Goal: Will remain free from falls ?Outcome: Completed/Met ?  ?Problem: Clinical Measurements: ?Goal: Ability to maintain clinical measurements within normal limits will improve ?Outcome: Completed/Met ?Goal: Postoperative complications will be avoided or minimized ?Outcome: Completed/Met ?Goal: Diagnostic test results will improve ?Outcome: Completed/Met ?  ?Problem: Pain Management: ?Goal: Pain level will decrease ?Outcome: Completed/Met ?  ?Problem: Bladder/Genitourinary: ?Goal: Urinary functional status for postoperative course will improve ?Outcome: Completed/Met ?  ?

## 2022-04-25 NOTE — Discharge Summary (Signed)
Physician Discharge Summary  ? ? ? ?Providing Compassionate, Quality Care - Together ? ? ?Patient ID: ?Christopher [name] ?MRN: [id number] ?DOB/AGE: [birth year] 76 y.o. ? ?Admit date: 04/25/2022 ?Discharge date: 04/25/2022 ? ?Admission Diagnoses: Malfunction of spinal cord stimulator ? ?Discharge Diagnoses:  ?Principal Problem: ?  Malfunction of spinal cord stimulator ([place]) ? ? ?Discharged Condition: good ? ?Hospital Course: Patient underwent revision/replacement of the SCS battery by Dr. Arnoldo Morale on 04/25/2022. He was admitted to 3C05 for observation following recovery from anesthesia in the PACU. His postoperative course has been uncomplicated. He is ambulating independently and without difficulty. He is tolerating a normal diet. He is not having any bowel or bladder dysfunction. His pain is well-controlled with oral pain medication. He is ready for discharge home. ? ? ?Consults: None ? ?Significant Diagnostic Studies: labs:  ?Results for orders placed or performed during the hospital encounter of 04/25/22 (from the past 24 hour(s))  ?Surgical pcr screen     Status: None  ? Collection Time: 04/25/22  6:46 AM  ? Specimen: Nasal Mucosa; Nasal Swab  ?Result Value Ref Range  ? MRSA, PCR NEGATIVE NEGATIVE  ? Staphylococcus aureus NEGATIVE NEGATIVE  ?Basic metabolic panel per protocol     Status: Abnormal  ? Collection Time: 04/25/22  7:14 AM  ?Result Value Ref Range  ? Sodium 143 135 - 145 mmol/L  ? Potassium 3.7 3.5 - 5.1 mmol/L  ? Chloride 112 (H) 98 - 111 mmol/L  ? CO2 24 22 - 32 mmol/L  ? Glucose, Bld 118 (H) 70 - 99 mg/dL  ? BUN 11 8 - 23 mg/dL  ? Creatinine, Ser 0.80 0.61 - 1.24 mg/dL  ? Calcium 8.9 8.9 - 10.3 mg/dL  ? GFR, Estimated >60 >60 mL/min  ? Anion gap 7 5 - 15  ?CBC per protocol     Status: Abnormal  ? Collection Time: 04/25/22  7:14 AM  ?Result Value Ref Range  ? WBC 8.5 4.0 - 10.5 K/uL  ? RBC 3.86 (L) 4.22 - 5.81 MIL/uL  ? Hemoglobin 12.0 (L) 13.0 - 17.0 g/dL  ? HCT 36.3 (L) 39.0 - 52.0 %  ? MCV 94.0 80.0 - 100.0  fL  ? MCH 31.1 26.0 - 34.0 pg  ? MCHC 33.1 30.0 - 36.0 g/dL  ? RDW 13.2 11.5 - 15.5 %  ? Platelets 293 150 - 400 K/uL  ? nRBC 0.0 0.0 - 0.2 %  ? ? ? ?Treatments: surgery: Revision/replacement of [employer] spinal cord stimulator ? ?Discharge Exam: ?Blood pressure 128/64, pulse (!) 52, temperature 97.6 ?F (36.4 ?C), temperature source Oral, resp. rate 18, height '5\' 8"'$  (1.727 m), weight 72.6 kg, SpO2 95 %. ? ?Alert and oriented x 4 ?PERRLA ?CN II-XII grossly intact ?MAE, Strength and sensation intact ?Incisions are covered with Honeycomb dressings and Steri Strips; Dressings are clean, dry, and intact ? ? ?Disposition: Discharge disposition: 01-Home or Self Care ? ? ? ? ? ? ?Discharge Instructions   ? ? Call MD for:  difficulty breathing, headache or visual disturbances   Complete by: As directed ?  ? Call MD for:  extreme fatigue   Complete by: As directed ?  ? Call MD for:  hives   Complete by: As directed ?  ? Call MD for:  persistant dizziness or light-headedness   Complete by: As directed ?  ? Call MD for:  persistant nausea and vomiting   Complete by: As directed ?  ? Call MD for:  redness, tenderness, or  signs of infection (pain, swelling, redness, odor or green/yellow discharge around incision site)   Complete by: As directed ?  ? Call MD for:  severe uncontrolled pain   Complete by: As directed ?  ? Call MD for:  temperature >100.4   Complete by: As directed ?  ? Diet - low sodium heart healthy   Complete by: As directed ?  ? Discharge instructions   Complete by: As directed ?  ? Call [phone] for a followup appointment. Take a stool softener while you are using pain medications.  ? Driving Restrictions   Complete by: As directed ?  ? Do not drive for 2 weeks.  ? Increase activity slowly   Complete by: As directed ?  ? Lifting restrictions   Complete by: As directed ?  ? Do not lift more than 5 pounds. No excessive bending or twisting.  ? May shower / Bathe   Complete by: As directed ?  ? Remove  the dressing for 3 days after surgery.  You may shower, but leave the incision alone.  ? Remove dressing in 48 hours   Complete by: As directed ?  ? ?  ? ?Allergies as of 04/25/2022   ? ?   Reactions  ? Tylenol [acetaminophen]   ? Stomach pain   ? Morphine And Related Nausea And Vomiting  ? Propofol Nausea And Vomiting  ? ?  ? ?  ?Medication List  ?  ? ?TAKE these medications   ? ?ALPRAZolam 1 MG tablet ?Commonly known as: Duanne Moron ?Take 1 mg by mouth at bedtime. ?  ?amiodarone 200 MG tablet ?Commonly known as: PACERONE ?TAKE 1 TABLET BY MOUTH EVERY DAY ?  ?apixaban 5 MG Tabs tablet ?Commonly known as: ELIQUIS ?Take 1 tablet (5 mg total) by mouth 2 (two) times daily. *Restart 04/28/2022* ?What changed: additional instructions ?  ?aspirin EC 81 MG tablet ?Take 1 tablet (81 mg total) by mouth daily. ?  ?atorvastatin 80 MG tablet ?Commonly known as: LIPITOR ?Take 1 tablet (80 mg total) by mouth daily. ?  ?B-12 PO ?Take 1 tablet by mouth daily. ?  ?ciprofloxacin 500 MG tablet ?Commonly known as: CIPRO ?Take 1 tablet (500 mg total) by mouth 2 (two) times daily. ?  ?docusate sodium 250 MG capsule ?Commonly known as: COLACE ?Take 250 mg by mouth daily. ?  ?FIBER-CAPS PO ?Take 2 capsules by mouth daily. ?  ?finasteride 5 MG tablet ?Commonly known as: PROSCAR ?Take 1 tablet (5 mg total) by mouth daily. ?  ?furosemide 20 MG tablet ?Commonly known as: LASIX ?Take 1 tablet (20 mg total) by mouth daily. ?  ?gabapentin 400 MG capsule ?Commonly known as: NEURONTIN ?One three times a day ?What changed:  ?how much to take ?how to take this ?when to take this ?additional instructions ?  ?linaclotide 72 MCG capsule ?Commonly known as: Linzess ?Take 1 capsule (72 mcg total) by mouth daily before breakfast. ?  ?nitroGLYCERIN 0.4 MG SL tablet ?Commonly known as: NITROSTAT ?Place 1 tablet (0.4 mg total) under the tongue every 5 (five) minutes as needed for chest pain. ?  ?Omega-3 1000 MG Caps ?Take 1,000 mg by mouth daily. ?  ?pantoprazole 40  MG tablet ?Commonly known as: PROTONIX ?TAKE 1 TABLET BY MOUTH TWICE A DAY (30 TO 60 MINUTES BEFORE 1ST AND LAST MEALS OF THE DAY) ?What changed:  ?how much to take ?when to take this ?additional instructions ?  ?potassium chloride 10 MEQ tablet ?Commonly known as: KLOR-CON M ?  Take 10 mEq by mouth daily. ?  ?silodosin 8 MG Caps capsule ?Commonly known as: RAPAFLO ?TAKE 1 CAPSULE BY MOUTH EVERYDAY AT BEDTIME ?What changed:  ?how much to take ?how to take this ?when to take this ?additional instructions ?  ?Simethicone 180 MG Caps ?Take 1 capsule (180 mg total) by mouth 3 (three) times daily as needed. ?What changed: reasons to take this ?  ?Systane Balance 0.6 % Soln ?Generic drug: Propylene Glycol ?Place 1 drop into both eyes daily as needed (dry eyes). ?  ?traMADol 50 MG tablet ?Commonly known as: ULTRAM ?Take 1-2 tablets (50-100 mg total) by mouth every 4 (four) hours as needed. ?What changed:  ?reasons to take this ?Another medication with the same name was removed. Continue taking this medication, and follow the directions you see here. ?  ?Vitamin D3 10 MCG (400 UNIT) Caps ?Take 400 Units by mouth daily. ?  ?vitamin E 200 UNIT capsule ?Take 200 Units by mouth daily. ?  ?ZINC 15 PO ?Take 15 mg by mouth daily. ?  ? ?  ? ? Follow-up Information   ? ? Newman Pies, MD. Daphane Shepherd on 05/11/2022.   ?Specialty: Neurosurgery ?Why: First post op appointment is 05/11/2022 at 8:30 AM. ?Contact information: ?[address] ?[address] ?[place] [address] ?[phone] ? ? ?  ?  ? ?  ?  ? ?  ? ? ?Signed: ?Viona Gilmore, DNP, AGNP-C ?Nurse Practitioner ? ?[facility] ?[address], [place], [place] [address] ?P: [phone]    F: [phone] ? ?04/25/2022, 2:26 PM ? ?

## 2022-04-25 NOTE — Anesthesia Postprocedure Evaluation (Signed)
Anesthesia Post Note ? ?Patient: Christopher [name] ? ?Procedure(s) Performed: REVISION OF  SPINAL CORD STIMULATOR ? ?  ? ?Patient location during evaluation: PACU ?Anesthesia Type: General ?Level of consciousness: awake and alert ?Pain management: pain level controlled ?Vital Signs Assessment: post-procedure vital signs reviewed and stable ?Respiratory status: spontaneous breathing, nonlabored ventilation, respiratory function stable and patient connected to nasal cannula oxygen ?Cardiovascular status: blood pressure returned to baseline and stable ?Postop Assessment: no apparent nausea or vomiting ?Anesthetic complications: no ? ? ?No notable events documented. ? ?Last Vitals:  ?Vitals:  ? 04/25/22 1108 04/25/22 1123  ?BP: (!) 116/59 116/60  ?Pulse: (!) 54 (!) 51  ?Resp: 18 16  ?Temp:    ?SpO2: 94% 95%  ?  ?Last Pain:  ?Vitals:  ? 04/25/22 1053  ?TempSrc:   ?PainSc: 6   ? ? ?  ?  ?  ?  ?  ?  ? ?[place] Hagger S ? ? ? ? ?

## 2022-04-25 NOTE — Progress Notes (Signed)
Patient alert and oriented, mae's well, voiding adequate amount of urine, swallowing without difficulty, no c/o pain at time of discharge. Patient discharged home with family. Script and discharged instructions given to patient. Patient and family stated understanding of instructions given. Patient has an appointment with Dr. Jenkins   

## 2022-04-25 NOTE — Transfer of Care (Signed)
Immediate Anesthesia Transfer of Care Note ? ?Patient: Christopher [name] ? ?Procedure(s) Performed: REVISION OF  SPINAL CORD STIMULATOR ? ?Patient Location: PACU ? ?Anesthesia Type:General ? ?Level of Consciousness: awake, alert , oriented, patient cooperative and responds to stimulation ? ?Airway & Oxygen Therapy: Patient Spontanous Breathing ? ?Post-op Assessment: Report given to RN and Post -op Vital signs reviewed and stable ? ?Post vital signs: Reviewed and stable ? ?Last Vitals:  ?Vitals Value Taken Time  ?BP 130/57 04/25/22 1023  ?Temp    ?Pulse 61 04/25/22 1022  ?Resp 6 04/25/22 1022  ?SpO2 98 % 04/25/22 1022  ?Vitals shown include unvalidated device data. ? ?Last Pain:  ?Vitals:  ? 04/25/22 0717  ?TempSrc:   ?PainSc: 0-No pain  ?   ? ?  ? ?Complications: No notable events documented. ?

## 2022-04-25 NOTE — Discharge Instructions (Addendum)
**  Restart Eliquis on 04/28/2022** ? ?Wound Care ?Keep incision covered and dry for two days.    ?Do not put any creams, lotions, or ointments on incision. ?Leave steri-strips on back.  They will fall off by themselves. ?You are fine to shower. Let water run over incision and pat dry. ? ?Activity ?Walk each and every day, increasing distance each day. ?No lifting greater than 5 lbs. ?No driving for 2 weeks; may ride as a passenger locally. ? ?Diet ?Resume your normal diet. ?  ?Return to Work ?Will be discussed at your follow up appointment. ? ?Call Your Doctor If Any of These Occur ?Redness, drainage, or swelling at the wound.  ?Temperature greater than 101 degrees. ?Severe pain not relieved by pain medication. ?Incision starts to come apart. ? ?Follow Up Appt ?Call [phone] today for appointment in 2-3 weeks if you don't already have one or for any problems. ?

## 2022-04-25 NOTE — Progress Notes (Signed)
PT Cancellation Note ? ?Patient Details ?Name: Christopher [name] ?MRN: [id number] ?DOB: October 17, 1946 ? ? ?Cancelled Treatment:    Reason Eval/Treat Not Completed: PT screened, no needs identified, will sign off (OT reports pt back to baseline). ? ?Wyona Almas, PT, DPT ?Acute Rehabilitation Services ?Pager [phone] ?Office [phone] ? ? ? ?Christopher [name] ?04/25/2022, 12:13 PM ?

## 2022-04-25 NOTE — H&P (Signed)
Subjective: ?The patient is a 76 year old white male anticoagulated for atrial fibrillation who has had a spinal cord stimulator placed by another physician.  The battery is failing.  The patient desires to have it revised/replaced. ? ?Past Medical History:  ?Diagnosis Date  ? A-fib ([place])   ? Arthritis   ? "all over my body" (06/17/2017)  ? BPH (benign prostatic hyperplasia)   ? takes Proscar daily  ? CAD (coronary artery disease)   ? a. s/p CABG in 09/2019 with SVG-PDA-OM and LIMA-LAD with LAA excision  ? Chronic back pain   ? DDD; "upper and lower" (06/17/2017)  ? Constipation   ? takes Colace daily  ? Coronary artery disease   ? DDD (degenerative disc disease), cervical   ? DVT (deep venous thrombosis) ([place])   ? left peroneal vein 07/2017 (in setting of recent left TKA)  ? Dysrhythmia   ? history of Atrial fibrillation, no long has this  ? Enlarged prostate   ? GERD (gastroesophageal reflux disease)   ? takes Nexium daily  ? H/O hiatal hernia   ? History of colon polyps   ? benign  ? History of gastric ulcer   ? Insomnia   ? takes Xanax nightly  ? Joint capsule tear   ? left knee  ? Joint pain   ? Joint swelling   ? Macular degeneration   ? Muscle spasm of back   ? takes Valium daily as needed  ? PAF (paroxysmal atrial fibrillation) ([place])   ? Peripheral neuropathy   ? takes Gabapentin daily  ? PONV (postoperative nausea and vomiting)   ? Pre-diabetes   ? Sleep apnea   ? pt does not wear a cpap  ? Urinary hesitancy   ? Weakness   ? numbness and tingling  ?  ?Past Surgical History:  ?Procedure Laterality Date  ? ANTERIOR CERVICAL DECOMP/DISCECTOMY FUSION  ~ 1991  ? "took bone from hip"  ? ANTERIOR CERVICAL DECOMP/DISCECTOMY FUSION  X 2  ? "put plate in"  ? ANTERIOR CERVICAL DISCECTOMY  <11/2005  ?  5-6 and 4-5./notes 05/02/2011  ? APPENDECTOMY    ? BACK SURGERY    ? BIOPSY  05/30/2018  ? Procedure: BIOPSY;  Surgeon: [name], MD;  [facility];  Service: Endoscopy;;  esophagus  ? BIOPSY  01/09/2022  ?  Procedure: BIOPSY;  Surgeon: Montez Morita, Quillian Quince, MD;  [facility];  Service: Gastroenterology;;  ? CARDIAC CATHETERIZATION    ? 3-4 per pt  ? CARPAL TUNNEL RELEASE Bilateral 10/2005 - 11/2005  ? right-left Archie Endo 05/02/2011  ? CATARACT EXTRACTION W/PHACO Right 05/24/2014  ? Procedure: CATARACT EXTRACTION PHACO AND INTRAOCULAR LENS PLACEMENT (IOC);  Surgeon: [name], MD;  [facility];  Service: Ophthalmology;  Laterality: Right;  CDE 6.74  ? CATARACT EXTRACTION W/PHACO Left 06/17/2014  ? Procedure: CATARACT EXTRACTION PHACO AND INTRAOCULAR LENS PLACEMENT (IOC);  Surgeon: [name], MD;  [facility];  Service: Ophthalmology;  Laterality: Left;  CDE:4.65  ? COLONOSCOPY WITH ESOPHAGOGASTRODUODENOSCOPY (EGD) N/A 01/22/2013  ? Procedure: COLONOSCOPY WITH ESOPHAGOGASTRODUODENOSCOPY (EGD);  Surgeon: [name], MD;  [facility];  Service: Endoscopy;  Laterality: N/A;  140  ? COLONOSCOPY WITH PROPOFOL N/A 05/30/2018  ? diverticulosis and external hemorrhoids  ? CORONARY ARTERY BYPASS GRAFT  2020  ? CYSTOSCOPY WITH INSERTION OF UROLIFT N/A 04/16/2022  ? Procedure: CYSTOSCOPY WITH INSERTION OF UROLIFT;  Surgeon: Cleon Gustin, MD;  [facility];  Service:  Urology;  Laterality: N/A;  ? ESOPHAGOGASTRODUODENOSCOPY (EGD) WITH PROPOFOL N/A 05/30/2018  ? normal esophagus, presence of possible barretts (bx consistent with reflux changes, GAVE)  ? ESOPHAGOGASTRODUODENOSCOPY (EGD) WITH PROPOFOL N/A 01/09/2022  ? Procedure: ESOPHAGOGASTRODUODENOSCOPY (EGD) WITH PROPOFOL;  Surgeon: Harvel Quale, MD;  [facility];  Service: Gastroenterology;  Laterality: N/A;  305, moved up per Leigh Amm  ? EYE SURGERY Bilateral   ? "cleaned film w/laser; since my 1st cataract OR"  ? JOINT REPLACEMENT    ? LEFT HEART CATH AND CORONARY ANGIOGRAPHY N/A 02/16/2019  ? Procedure: LEFT HEART CATH AND CORONARY ANGIOGRAPHY;  Surgeon: Belva Crome, MD;  Location: [facility];  Service: Cardiovascular;  Laterality: N/A;  ? POSTERIOR LUMBAR FUSION    ? "put a plate in"  ? QUADRICEPS TENDON REPAIR Left 07/01/2017  ? QUADRICEPS TENDON REPAIR Left 07/01/2017  ? Procedure: REPAIR QUADRICEP TENDON;  Surgeon: [name], MD;  Location: [facility];  Service: Orthopedics;  Laterality: Left;  ? SHOULDER ARTHROSCOPY Bilateral   ? SPINAL CORD STIMULATOR INSERTION N/A 09/23/2015  ? Procedure: LUMBAR SPINAL CORD STIMULATOR INSERTION;  Surgeon: [name], MD;  Location: [facility];  Service: Neurosurgery;  Laterality: N/A;  LUMBAR SPINAL CORD STIMULATOR INSERTION  ? TOTAL KNEE ARTHROPLASTY Left 06/17/2017  ? TOTAL KNEE ARTHROPLASTY Left 06/17/2017  ? Procedure: TOTAL KNEE ARTHROPLASTY;  Surgeon: [name], MD;  Location: [facility];  Service: Orthopedics;  Laterality: Left;  ? TOTAL KNEE ARTHROPLASTY Right 12/16/2017  ? Procedure: TOTAL KNEE ARTHROPLASTY;  Surgeon: [name], MD;  Location: [facility];  Service: Orthopedics;  Laterality: Right;  ?  ?Allergies  ?Allergen Reactions  ? Tylenol [Acetaminophen]   ?  Stomach pain   ? Morphine And Related Nausea And Vomiting  ? Propofol Nausea And Vomiting  ?  ?Social History  ? ?Tobacco Use  ? Smoking status: Former  ?  Packs/day: 1.00  ?  Years: 30.00  ?  Pack years: 30.00  ?  Types: Cigarettes  ?  Quit date: 01/14/1987  ?  Years since quitting: 35.3  ? Smokeless tobacco: Former  ?  Types: Chew  ? Tobacco comments:  ?  quit smoking  & chewing by 1987  ?Substance Use Topics  ? Alcohol use: Yes  ?  Comment: 2-3 beers per night  ?  ?History reviewed. No pertinent family history. ?Prior to Admission medications   ?Medication Sig Start Date End Date Taking? Authorizing Provider  ?ALPRAZolam (XANAX) 1 MG tablet Take 1 mg by mouth at bedtime. 09/27/17  Yes [provider]  ?amiodarone (PACERONE) 200 MG tablet TAKE 1 TABLET BY MOUTH EVERY DAY 04/10/22  Yes Evans Lance, MD  ?apixaban (ELIQUIS) 5 MG TABS tablet Take 1 tablet (5 mg total) by mouth 2 (two)  times daily. 05/31/18  Yes Rehman, Mechele Dawley, MD  ?aspirin EC 81 MG tablet Take 1 tablet (81 mg total) by mouth daily. 02/11/19  Yes Weaver, Scott T, PA-C  ?atorvastatin (LIPITOR) 80 MG tablet Take 1 tablet (80 mg total) by mouth daily. 04/04/22  Yes Strader, Fransisco Hertz, PA-C  ?Calcium Polycarbophil (FIBER-CAPS PO) Take 2 capsules by mouth daily.   Yes [provider]  ?Cholecalciferol (VITAMIN D3) 10 MCG (400 UNIT) CAPS Take 400 Units by mouth daily.   Yes [provider]  ?ciprofloxacin (CIPRO) 500 MG tablet Take 1 tablet (500 mg total) by mouth 2 (two) times daily. 04/19/22  Yes Stoneking, Reece Leader., MD  ?[place] Pare (  B-12 PO) Take 1 tablet by mouth daily.   Yes [provider]  ?docusate sodium (COLACE) 250 MG capsule Take 250 mg by mouth daily.   Yes [provider]  ?finasteride (PROSCAR) 5 MG tablet Take 1 tablet (5 mg total) by mouth daily. 09/13/21  Yes McKenzie, Candee Furbish, MD  ?furosemide (LASIX) 20 MG tablet Take 1 tablet (20 mg total) by mouth daily. 01/02/21  Yes Belva Crome, MD  ?gabapentin (NEURONTIN) 400 MG capsule One three times a day ?Patient taking differently: Take 400 mg by mouth 3 (three) times daily. 01/05/19  Yes Tanda Rockers, MD  ?linaclotide Rolan Lipa) 72 MCG capsule Take 1 capsule (72 mcg total) by mouth daily before breakfast. 02/07/22  Yes Montez Morita, Quillian Quince, MD  ?Omega-3 1000 MG CAPS Take 1,000 mg by mouth daily.   Yes [provider]  ?pantoprazole (PROTONIX) 40 MG tablet TAKE 1 TABLET BY MOUTH TWICE A DAY (30 TO 60 MINUTES BEFORE 1ST AND LAST MEALS OF THE DAY) ?Patient taking differently: 40 mg 2 (two) times daily. (30 TO 60 MINUTES BEFORE 1ST AND LAST MEALS OF THE DAY) 10/20/19  Yes Tanda Rockers, MD  ?potassium chloride (KLOR-CON) 10 MEQ tablet Take 10 mEq by mouth daily. 09/29/19  Yes [provider]  ?Propylene Glycol (SYSTANE BALANCE) 0.6 % SOLN Place 1 drop into both eyes daily as needed (dry eyes).   Yes [provider]  ?silodosin (RAPAFLO) 8 MG CAPS capsule TAKE 1 CAPSULE BY MOUTH EVERYDAY AT BEDTIME ?Patient taking differently: Take 8 mg by mouth at bedtime. 09/13/21  Yes McKenzie, Candee Furbish, MD  ?[facility] Shape

## 2022-04-26 ENCOUNTER — Encounter (HOSPITAL_COMMUNITY): Payer: Self-pay | Admitting: Neurosurgery

## 2022-05-01 ENCOUNTER — Ambulatory Visit (INDEPENDENT_AMBULATORY_CARE_PROVIDER_SITE_OTHER): Payer: Medicare Other | Admitting: Physician Assistant

## 2022-05-01 VITALS — BP 117/63 | HR 58 | Ht 68.0 in | Wt 157.0 lb

## 2022-05-01 DIAGNOSIS — N138 Other obstructive and reflux uropathy: Secondary | ICD-10-CM | POA: Diagnosis not present

## 2022-05-01 DIAGNOSIS — N401 Enlarged prostate with lower urinary tract symptoms: Secondary | ICD-10-CM | POA: Diagnosis not present

## 2022-05-01 LAB — BLADDER SCAN AMB NON-IMAGING: Scan Result: 31

## 2022-05-01 NOTE — Progress Notes (Signed)
post void residual =36m ?

## 2022-05-01 NOTE — Progress Notes (Signed)
? ?Assessment: ?1. Benign prostatic hyperplasia with urinary obstruction ?- BLADDER SCAN AMB NON-IMAGING ?- Urinalysis, Routine w reflex microscopic ?  ? ?Plan: ?Keep scheduled follow-up with Dr. [name] in 2 months for UA and PVR.  He may slightly resume increase in physical activities as his back pain allows, but is cautioned against heavy labor and riding his lawnmower for another 2 weeks. Will continue Rapaflo and finasteride until FU. ? ?Chief Complaint: ?No chief complaint on file. ? ? ?HPI: ?Christopher [name] is a 76 y.o. male who presents for continued evaluation of   BPH with obstruction.  He presents today for PVR and urinalysis status post UroLift procedure performed on 04/16/2022.  He is 2 weeks postop now and his only complaints are new onset of intermittency over the past several days as well as 1 episode of nocturia which she has never had.  Gross hematuria at the end of the stream occurred a few times last week, but has since resolved.  No burning, dysuria.  He continues taking finasteride and Rapaflo.  Patient has been somewhat inactive since his last visit following nerve stimulator change in his low back. ?UA = clear ?PVR = 30 mL ?IPSS=7, QOL= 3 ? ?04/18/22 ?Christopher [name] is a 76 y.o. male who presents for continued evaluation of BPH with obstruction.  UroLift: Date of surgery 04/16/2022.  The patient is 2 days status post UroLift and states he has been doing well except for persistent urgency and distal penile burning.  No gross hematuria.  He has resumed his Eliquis. Voiding trial successful today.  Follow-up In 2 weeks for UA and PVR and with Dr. [name] in 2 months for the same.  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  Postop care for UroLift information discussed and given in written form. ?  ?Portions of the above documentation were copied from a prior visit for review  purposes only. ? ?Allergies: ?Allergies  ?Allergen Reactions  ? Tylenol [Acetaminophen]   ?  Stomach pain   ? Morphine And Related Nausea And Vomiting  ? Propofol Nausea And Vomiting  ? ? ?PMH: ?Past Medical History:  ?Diagnosis Date  ? A-fib ([place])   ? Arthritis   ? "all over my body" (06/17/2017)  ? BPH (benign prostatic hyperplasia)   ? takes Proscar daily  ? CAD (coronary artery disease)   ? a. s/p CABG in 09/2019 with SVG-PDA-OM and LIMA-LAD with LAA excision  ? Chronic back pain   ? DDD; "upper and lower" (06/17/2017)  ? Constipation   ? takes Colace daily  ? Coronary artery disease   ? DDD (degenerative disc disease), cervical   ? DVT (deep venous thrombosis) ([place])   ? left peroneal vein 07/2017 (in setting of recent left TKA)  ? Dysrhythmia   ? history of Atrial fibrillation, no long has this  ? Enlarged prostate   ? GERD (gastroesophageal reflux disease)   ? takes Nexium daily  ? H/O hiatal hernia   ? History of colon polyps   ? benign  ? History of gastric ulcer   ? Insomnia   ? takes Xanax nightly  ? Joint capsule tear   ? left knee  ? Joint pain   ? Joint swelling   ? Macular degeneration   ? Muscle spasm of back   ? takes Valium daily as needed  ? PAF (paroxysmal atrial fibrillation) ([place])   ?  Peripheral neuropathy   ? takes Gabapentin daily  ? PONV (postoperative nausea and vomiting)   ? Pre-diabetes   ? Sleep apnea   ? pt does not wear a cpap  ? Urinary hesitancy   ? Weakness   ? numbness and tingling  ? ? ?PSH: ?Past Surgical History:  ?Procedure Laterality Date  ? ANTERIOR CERVICAL DECOMP/DISCECTOMY FUSION  ~ 1991  ? "took bone from hip"  ? ANTERIOR CERVICAL DECOMP/DISCECTOMY FUSION  X 2  ? "put plate in"  ? ANTERIOR CERVICAL DISCECTOMY  <11/2005  ?  5-6 and 4-5./notes 05/02/2011  ? APPENDECTOMY    ? BACK SURGERY    ? BIOPSY  05/30/2018  ? Procedure: BIOPSY;  Surgeon: [name], MD;  [facility];  Service: Endoscopy;;  esophagus  ? BIOPSY  01/09/2022  ? Procedure: BIOPSY;  Surgeon:  Montez Morita, Quillian Quince, MD;  [facility];  Service: Gastroenterology;;  ? CARDIAC CATHETERIZATION    ? 3-4 per pt  ? CARPAL TUNNEL RELEASE Bilateral 10/2005 - 11/2005  ? right-left Archie Endo 05/02/2011  ? CATARACT EXTRACTION W/PHACO Right 05/24/2014  ? Procedure: CATARACT EXTRACTION PHACO AND INTRAOCULAR LENS PLACEMENT (IOC);  Surgeon: [name], MD;  [facility];  Service: Ophthalmology;  Laterality: Right;  CDE 6.74  ? CATARACT EXTRACTION W/PHACO Left 06/17/2014  ? Procedure: CATARACT EXTRACTION PHACO AND INTRAOCULAR LENS PLACEMENT (IOC);  Surgeon: [name], MD;  [facility];  Service: Ophthalmology;  Laterality: Left;  CDE:4.65  ? COLONOSCOPY WITH ESOPHAGOGASTRODUODENOSCOPY (EGD) N/A 01/22/2013  ? Procedure: COLONOSCOPY WITH ESOPHAGOGASTRODUODENOSCOPY (EGD);  Surgeon: [name], MD;  [facility];  Service: Endoscopy;  Laterality: N/A;  140  ? COLONOSCOPY WITH PROPOFOL N/A 05/30/2018  ? diverticulosis and external hemorrhoids  ? CORONARY ARTERY BYPASS GRAFT  2020  ? CYSTOSCOPY WITH INSERTION OF UROLIFT N/A 04/16/2022  ? Procedure: CYSTOSCOPY WITH INSERTION OF UROLIFT;  Surgeon: Cleon Gustin, MD;  [facility];  Service: Urology;  Laterality: N/A;  ? ESOPHAGOGASTRODUODENOSCOPY (EGD) WITH PROPOFOL N/A 05/30/2018  ? normal esophagus, presence of possible barretts (bx consistent with reflux changes, GAVE)  ? ESOPHAGOGASTRODUODENOSCOPY (EGD) WITH PROPOFOL N/A 01/09/2022  ? Procedure: ESOPHAGOGASTRODUODENOSCOPY (EGD) WITH PROPOFOL;  Surgeon: Harvel Quale, MD;  [facility];  Service: Gastroenterology;  Laterality: N/A;  305, moved up per Leigh Amm  ? EYE SURGERY Bilateral   ? "cleaned film w/laser; since my 1st cataract OR"  ? JOINT REPLACEMENT    ? LEFT HEART CATH AND CORONARY ANGIOGRAPHY N/A 02/16/2019  ? Procedure: LEFT HEART CATH AND CORONARY ANGIOGRAPHY;  Surgeon: Belva Crome, MD;  Location: [facility];  Service: Cardiovascular;   Laterality: N/A;  ? POSTERIOR LUMBAR FUSION    ? "put a plate in"  ? QUADRICEPS TENDON REPAIR Left 07/01/2017  ? QUADRICEPS TENDON REPAIR Left 07/01/2017  ? Procedure: REPAIR QUADRICEP TENDON;  Surgeon: [name], MD;  Location: [facility];  Service: Orthopedics;  Laterality: Left;  ? SHOULDER ARTHROSCOPY Bilateral   ? SPINAL CORD STIMULATOR INSERTION N/A 09/23/2015  ? Procedure: LUMBAR SPINAL CORD STIMULATOR INSERTION;  Surgeon: [name], MD;  Location: [facility];  Service: Neurosurgery;  Laterality: N/A;  LUMBAR SPINAL CORD STIMULATOR INSERTION  ? SPINAL CORD STIMULATOR INSERTION N/A 04/25/2022  ? Procedure: REVISION OF  SPINAL CORD STIMULATOR;  Surgeon: Newman Pies, MD;  Location: [facility];  Service: Neurosurgery;  Laterality: N/A;  ? TOTAL KNEE ARTHROPLASTY Left 06/17/2017  ? TOTAL  KNEE ARTHROPLASTY Left 06/17/2017  ? Procedure: TOTAL KNEE ARTHROPLASTY;  Surgeon: [name], MD;  Location: [facility];  Service: Orthopedics;  Laterality: Left;  ? TOTAL KNEE ARTHROPLASTY Right 12/16/2017  ? Procedure: TOTAL KNEE ARTHROPLASTY;  Surgeon: [name], MD;  Location: [facility];  Service: Orthopedics;  Laterality: Right;  ? ? ?SH: ?Social History  ? ?Tobacco Use  ? Smoking status: Former  ?  Packs/day: 1.00  ?  Years: 30.00  ?  Pack years: 30.00  ?  Types: Cigarettes  ?  Quit date: 01/14/1987  ?  Years since quitting: 35.3  ? Smokeless tobacco: Former  ?  Types: Chew  ? Tobacco comments:  ?  quit smoking  & chewing by 1987  ?Vaping Use  ? Vaping Use: Never used  ?Substance Use Topics  ? Alcohol use: Yes  ?  Comment: 2-3 beers per night  ? Drug use: No  ? ? ?ROS: ?Constitutional:  Negative for fever, chills, weight loss ?CV: Negative for chest pain ?Respiratory:  Negative for shortness of breath, wheezing, sleep apnea, frequent cough ?GI:  Negative for nausea, vomiting, bloody stool, GERD ? ?PE: ?BP 117/63   Pulse (!) 58   Ht '5\' 8"'$  (1.727 m)   Wt 157 lb (71.2 kg)   BMI 23.87 kg/m?  ?GENERAL APPEARANCE:  Well  appearing, well developed, well nourished, NAD ?HEENT:  Atraumatic, normocephalic ?NECK:  Supple. Trachea midline ?ABDOMEN:  Soft, non-tender, no masses ?EXTREMITIES: Moves extremities well, gait is shuffling and cautious. ?

## 2022-05-02 LAB — URINALYSIS, ROUTINE W REFLEX MICROSCOPIC
Bilirubin, UA: NEGATIVE
Glucose, UA: NEGATIVE
Ketones, UA: NEGATIVE
Leukocytes,UA: NEGATIVE
Nitrite, UA: NEGATIVE
Protein,UA: NEGATIVE
RBC, UA: NEGATIVE
Specific Gravity, UA: 1.02 (ref 1.005–1.030)
Urobilinogen, Ur: 2 mg/dL — ABNORMAL HIGH (ref 0.2–1.0)
pH, UA: 6 (ref 5.0–7.5)

## 2022-05-09 ENCOUNTER — Encounter: Payer: Self-pay | Admitting: Internal Medicine

## 2022-05-09 ENCOUNTER — Ambulatory Visit: Payer: Medicare Other | Admitting: Internal Medicine

## 2022-05-09 VITALS — BP 118/62 | HR 57 | Ht 68.0 in | Wt 153.4 lb

## 2022-05-09 DIAGNOSIS — I480 Paroxysmal atrial fibrillation: Secondary | ICD-10-CM | POA: Diagnosis not present

## 2022-05-09 NOTE — Progress Notes (Signed)
HPI Christopher [name] returns for followup. He is a pleasant 76 yo man with CAD, s/p CABG, HTN, PAF, on amiodarone. He has not had syncope or palpitations. He was bradycardic. He has sleep apnea but was intolerant and is not using his CPAP. No chest pain. No edema. He is s/p spinal stimulator. He has non-exertional chest pain. Main complaint is back pain.  Allergies  Allergen Reactions   Tylenol [Acetaminophen]     Stomach pain    Morphine And Related Nausea And Vomiting   Propofol Nausea And Vomiting     Current Outpatient Medications  Medication Sig Dispense Refill   ALPRAZolam (XANAX) 1 MG tablet Take 1 mg by mouth at bedtime.  0   amiodarone (PACERONE) 200 MG tablet TAKE 1 TABLET BY MOUTH EVERY DAY 90 tablet 3   apixaban (ELIQUIS) 5 MG TABS tablet Take 1 tablet (5 mg total) by mouth 2 (two) times daily. *Restart 04/28/2022* 60 tablet    aspirin EC 81 MG tablet Take 1 tablet (81 mg total) by mouth daily. 90 tablet 3   atorvastatin (LIPITOR) 80 MG tablet Take 1 tablet (80 mg total) by mouth daily. 90 tablet 3   Calcium Polycarbophil (FIBER-CAPS PO) Take 2 capsules by mouth daily.     Cholecalciferol (VITAMIN D3) 10 MCG (400 UNIT) CAPS Take 400 Units by mouth daily.     Cyanocobalamin (B-12 PO) Take 1 tablet by mouth daily.     docusate sodium (COLACE) 250 MG capsule Take 250 mg by mouth daily.     finasteride (PROSCAR) 5 MG tablet Take 1 tablet (5 mg total) by mouth daily. 90 tablet 3   furosemide (LASIX) 20 MG tablet Take 1 tablet (20 mg total) by mouth daily. 90 tablet 3   gabapentin (NEURONTIN) 400 MG capsule One three times a day (Patient taking differently: Take 400 mg by mouth 3 (three) times daily.)     linaclotide (LINZESS) 72 MCG capsule Take 1 capsule (72 mcg total) by mouth daily before breakfast. 90 capsule 3   nitroGLYCERIN (NITROSTAT) 0.4 MG SL tablet Place 1 tablet (0.4 mg total) under the tongue every 5 (five) minutes as needed for chest pain. 25 tablet 5   Omega-3 1000  MG CAPS Take 1,000 mg by mouth daily.     pantoprazole (PROTONIX) 40 MG tablet TAKE 1 TABLET BY MOUTH TWICE A DAY (30 TO 60 MINUTES BEFORE 1ST AND LAST MEALS OF THE DAY) (Patient taking differently: 40 mg 2 (two) times daily. (30 TO 60 MINUTES BEFORE 1ST AND LAST MEALS OF THE DAY)) 180 tablet 0   potassium chloride (KLOR-CON) 10 MEQ tablet Take 10 mEq by mouth daily.     Propylene Glycol (SYSTANE BALANCE) 0.6 % SOLN Place 1 drop into both eyes daily as needed (dry eyes).     silodosin (RAPAFLO) 8 MG CAPS capsule TAKE 1 CAPSULE BY MOUTH EVERYDAY AT BEDTIME (Patient taking differently: Take 8 mg by mouth at bedtime.) 90 capsule 3   Simethicone 180 MG CAPS Take 1 capsule (180 mg total) by mouth 3 (three) times daily as needed. (Patient taking differently: Take 180 mg by mouth 3 (three) times daily as needed (bloating).)  0   vitamin E 200 UNIT capsule Take 200 Units by mouth daily.     Zinc Sulfate (ZINC 15 PO) Take 15 mg by mouth daily.     traMADol (ULTRAM) 50 MG tablet Take 1-2 tablets (50-100 mg total) by mouth every 4 (four) hours  as needed. (Patient not taking: Reported on 05/09/2022) 48 tablet 0   No current facility-administered medications for this visit.     Past Medical History:  Diagnosis Date   A-fib [facility])    Arthritis    "all over my body" (06/17/2017)   BPH (benign prostatic hyperplasia)    takes Proscar daily   CAD (coronary artery disease)    a. s/p CABG in 09/2019 with SVG-PDA-OM and LIMA-LAD with LAA excision   Chronic back pain    DDD; "upper and lower" (06/17/2017)   Constipation    takes Colace daily   Coronary artery disease    DDD (degenerative disc disease), cervical    DVT (deep venous thrombosis) (HCC)    left peroneal vein 07/2017 (in setting of recent left TKA)   Dysrhythmia    history of Atrial fibrillation, no long has this   Enlarged prostate    GERD (gastroesophageal reflux disease)    takes Nexium daily   H/O hiatal hernia    History of colon polyps     benign   History of gastric ulcer    Insomnia    takes Xanax nightly   Joint capsule tear    left knee   Joint pain    Joint swelling    Macular degeneration    Muscle spasm of back    takes Valium daily as needed   PAF (paroxysmal atrial fibrillation) (HCC)    Peripheral neuropathy    takes Gabapentin daily   PONV (postoperative nausea and vomiting)    Pre-diabetes    Sleep apnea    pt does not wear a cpap   Urinary hesitancy    Weakness    numbness and tingling    ROS:   All systems reviewed and negative except as noted in the HPI.   Past Surgical History:  Procedure Laterality Date   ANTERIOR CERVICAL DECOMP/DISCECTOMY FUSION  ~ 1991   "took bone from hip"   ANTERIOR CERVICAL DECOMP/DISCECTOMY FUSION  X 2   "put plate in"   ANTERIOR CERVICAL DISCECTOMY  <11/2005    5-6 and 4-5./notes 05/02/2011   APPENDECTOMY     BACK SURGERY     BIOPSY  05/30/2018   Procedure: BIOPSY;  Surgeon: [name], MD;  [facility];  Service: Endoscopy;;  esophagus   BIOPSY  01/09/2022   Procedure: BIOPSY;  Surgeon: Harvel Quale, MD;  [facility];  Service: Gastroenterology;;   CARDIAC CATHETERIZATION     3-4 per pt   CARPAL TUNNEL RELEASE Bilateral 10/2005 - 11/2005   right-left /notes 05/02/2011   CATARACT EXTRACTION W/PHACO Right 05/24/2014   Procedure: CATARACT EXTRACTION PHACO AND INTRAOCULAR LENS PLACEMENT ([place]);  Surgeon: [name], MD;  Location: [facility];  Service: Ophthalmology;  Laterality: Right;  CDE 6.74   CATARACT EXTRACTION W/PHACO Left 06/17/2014   Procedure: CATARACT EXTRACTION PHACO AND INTRAOCULAR LENS PLACEMENT ([facility]);  Surgeon: [name], MD;  Location: [facility];  Service: Ophthalmology;  Laterality: Left;  CDE:4.65   COLONOSCOPY WITH ESOPHAGOGASTRODUODENOSCOPY (EGD) N/A 01/22/2013   Procedure: COLONOSCOPY WITH ESOPHAGOGASTRODUODENOSCOPY (EGD);  Surgeon: [name], MD;  [facility];  Service: Endoscopy;   Laterality: N/A;  140   COLONOSCOPY WITH PROPOFOL N/A 05/30/2018   diverticulosis and external hemorrhoids   CORONARY ARTERY BYPASS GRAFT  2020   CYSTOSCOPY WITH INSERTION OF UROLIFT N/A 04/16/2022   Procedure: CYSTOSCOPY WITH INSERTION OF UROLIFT;  Surgeon: Cleon Gustin, MD;  Location:  [facility];  Service: Urology;  Laterality: N/A;   ESOPHAGOGASTRODUODENOSCOPY (EGD) WITH PROPOFOL N/A 05/30/2018   normal esophagus, presence of possible barretts (bx consistent with reflux changes, GAVE)   ESOPHAGOGASTRODUODENOSCOPY (EGD) WITH PROPOFOL N/A 01/09/2022   Procedure: ESOPHAGOGASTRODUODENOSCOPY (EGD) WITH PROPOFOL;  Surgeon: Harvel Quale, MD;  [facility];  Service: Gastroenterology;  Laterality: N/A;  305, moved up per Leigh Amm   EYE SURGERY Bilateral    "cleaned film w/laser; since my 1st cataract OR"   JOINT REPLACEMENT     LEFT HEART CATH AND CORONARY ANGIOGRAPHY N/A 02/16/2019   Procedure: LEFT HEART CATH AND CORONARY ANGIOGRAPHY;  Surgeon: Belva Crome, MD;  Location: [place] [facility];  Service: Cardiovascular;  Laterality: N/A;   POSTERIOR LUMBAR FUSION     "put a plate in"   QUADRICEPS TENDON REPAIR Left 07/01/2017   QUADRICEPS TENDON REPAIR Left 07/01/2017   Procedure: REPAIR QUADRICEP TENDON;  Surgeon: [name], MD;  Location: [place];  Service: Orthopedics;  Laterality: Left;   SHOULDER ARTHROSCOPY Bilateral    SPINAL CORD STIMULATOR INSERTION N/A 09/23/2015   Procedure: LUMBAR SPINAL CORD STIMULATOR INSERTION;  Surgeon: [name], MD;  Location: [place] [facility];  Service: Neurosurgery;  Laterality: N/A;  LUMBAR SPINAL CORD STIMULATOR INSERTION   SPINAL CORD STIMULATOR INSERTION N/A 04/25/2022   Procedure: REVISION OF  SPINAL CORD STIMULATOR;  Surgeon: Newman Pies, MD;  Location: [place];  Service: Neurosurgery;  Laterality: N/A;   TOTAL KNEE ARTHROPLASTY Left 06/17/2017   TOTAL KNEE ARTHROPLASTY Left 06/17/2017   Procedure: TOTAL KNEE ARTHROPLASTY;   Surgeon: [name], MD;  Location: [place];  Service: Orthopedics;  Laterality: Left;   TOTAL KNEE ARTHROPLASTY Right 12/16/2017   Procedure: TOTAL KNEE ARTHROPLASTY;  Surgeon: [name], MD;  Location: [place];  Service: Orthopedics;  Laterality: Right;     History reviewed. No pertinent family history.   Social History   Socioeconomic History   Marital status: Divorced    Spouse name: Not on file   Number of children: 3   Years of education: Not on file   Highest education level: Not on file  Occupational History   Not on file  Tobacco Use   Smoking status: Former    Packs/day: 1.00    Years: 30.00    Pack years: 30.00    Types: Cigarettes    Quit date: 01/14/1987    Years since quitting: 35.3   Smokeless tobacco: Former    Types: Chew   Tobacco comments:    quit smoking  & chewing by 1987  Vaping Use   Vaping Use: Never used  Substance and Sexual Activity   Alcohol use: Yes    Comment: 2-3 beers per night   Drug use: No   Sexual activity: Not Currently  Other Topics Concern   Not on file  Social History Narrative   Not on file   Social Determinants of Health   Financial Resource Strain: Not on file  Food Insecurity: Not on file  Transportation Needs: Not on file  Physical Activity: Not on file  Stress: Not on file  Social Connections: Not on file  Intimate Partner Violence: Not on file     BP 118/62   Pulse (!) 57   Ht '5\' 8"'$  (1.727 m)   Wt 153 lb 6.4 oz (69.6 kg)   SpO2 98%   BMI 23.32 kg/m   Physical Exam:  Well appearing NAD HEENT: Unremarkable Neck:  No JVD, no  thyromegally Lymphatics:  No adenopathy Back:  No CVA tenderness Lungs:  Clear with no wheezes HEART:  Regular rate rhythm, no murmurs, no rubs, no clicks Abd:  soft, positive bowel sounds, no organomegally, no rebound, no guarding Ext:  2 plus pulses, no edema, no cyanosis, no clubbing Skin:  No rashes no nodules Neuro:  CN II through XII intact, motor grossly  intact   Assess/Plan:  1. PAF - he is maintaining NSR. He will continue his amiodarone. 2. CAD - he denies anginal symptoms. We will follow. His chest pain is non-cardiac 3. Sleep apnea - he has been prescribed CPAP but has not been tolerant.  4. Weight gain - he has struggled with his diet but is now doing time restricted eating by fasting after 3 in the afternoon.   Carleene Overlie Christopher Welty,MD

## 2022-05-09 NOTE — Patient Instructions (Signed)
Medication Instructions:  Your physician recommends that you continue on your current medications as directed. Please refer to the Current Medication list given to you today.  *If you need a refill on your cardiac medications before your next appointment, please call your pharmacy*   Lab Work: NONE   If you have labs (blood work) drawn today and your tests are completely normal, you will receive your results only by: MyChart Message (if you have MyChart) OR A paper copy in the mail If you have any lab test that is abnormal or we need to change your treatment, we will call you to review the results.   Testing/Procedures: NONE    Follow-Up: At [facility], you and your health needs are our priority.  As part of our continuing mission to provide you with exceptional heart care, we have created designated Provider Care Teams.  These Care Teams include your primary Cardiologist (physician) and Advanced Practice Providers (APPs -  Physician Assistants and Nurse Practitioners) who all work together to provide you with the care you need, when you need it.  We recommend signing up for the patient portal called "MyChart".  Sign up information is provided on this After Visit Summary.  MyChart is used to connect with patients for Virtual Visits (Telemedicine).  Patients are able to view lab/test results, encounter notes, upcoming appointments, etc.  Non-urgent messages can be sent to your provider as well.   To learn more about what you can do with MyChart, go to [email or web address].    Your next appointment:   1 year(s)  The format for your next appointment:   In Person  Provider:   Gregg Taylor, MD    Other Instructions Thank you for choosing [facility]!    Important Information About Sugar       

## 2022-05-16 ENCOUNTER — Telehealth: Payer: Self-pay

## 2022-05-16 ENCOUNTER — Other Ambulatory Visit (INDEPENDENT_AMBULATORY_CARE_PROVIDER_SITE_OTHER): Payer: Self-pay | Admitting: Gastroenterology

## 2022-05-16 DIAGNOSIS — K581 Irritable bowel syndrome with constipation: Secondary | ICD-10-CM

## 2022-05-16 MED ORDER — LUBIPROSTONE 24 MCG PO CAPS: 24.0000 ug | ORAL_CAPSULE | Freq: Two times a day (BID) | ORAL | 3 refills | Status: DC

## 2022-05-16 NOTE — Telephone Encounter (Signed)
I called and spoke with the patient he is aware of all and aware if any issues to call us back.

## 2022-05-16 NOTE — Telephone Encounter (Signed)
Patient called today stating his Linzess is now over $400.00 and he can no longer afford. Please advise and change to an alternative. Send to [place]. Thanks,

## 2022-05-16 NOTE — Telephone Encounter (Signed)
Thanks for the update, will send Amitiza 24 mcg BID.

## 2022-05-17 DIAGNOSIS — Z6824 Body mass index (BMI) 24.0-24.9, adult: Secondary | ICD-10-CM | POA: Diagnosis not present

## 2022-05-17 DIAGNOSIS — G4700 Insomnia, unspecified: Secondary | ICD-10-CM | POA: Diagnosis not present

## 2022-05-25 ENCOUNTER — Ambulatory Visit: Payer: Medicare Other | Admitting: Physician Assistant

## 2022-05-25 VITALS — BP 145/70 | HR 54 | Ht 68.0 in | Wt 170.0 lb

## 2022-05-25 DIAGNOSIS — R3915 Urgency of urination: Secondary | ICD-10-CM

## 2022-05-25 DIAGNOSIS — R3912 Poor urinary stream: Secondary | ICD-10-CM | POA: Diagnosis not present

## 2022-05-25 DIAGNOSIS — R3913 Splitting of urinary stream: Secondary | ICD-10-CM

## 2022-05-25 DIAGNOSIS — N3000 Acute cystitis without hematuria: Secondary | ICD-10-CM

## 2022-05-25 DIAGNOSIS — N401 Enlarged prostate with lower urinary tract symptoms: Secondary | ICD-10-CM

## 2022-05-25 DIAGNOSIS — N138 Other obstructive and reflux uropathy: Secondary | ICD-10-CM

## 2022-05-25 LAB — MICROSCOPIC EXAMINATION
Renal Epithel, UA: NONE SEEN /hpf
WBC, UA: >30 /hpf — AB (ref 0–5)

## 2022-05-25 LAB — URINALYSIS, ROUTINE W REFLEX MICROSCOPIC
Bilirubin, UA: NEGATIVE
Glucose, UA: NEGATIVE
Ketones, UA: NEGATIVE
Nitrite, UA: NEGATIVE
Protein,UA: NEGATIVE
Specific Gravity, UA: 1.01 (ref 1.005–1.030)
Urobilinogen, Ur: 0.2 mg/dL (ref 0.2–1.0)
pH, UA: 7 (ref 5.0–7.5)

## 2022-05-25 LAB — BLADDER SCAN AMB NON-IMAGING: Scan Result: 49

## 2022-05-25 MED ORDER — NITROFURANTOIN MONOHYD MACRO 100 MG PO CAPS: 100.0000 mg | ORAL_CAPSULE | Freq: Two times a day (BID) | ORAL | 0 refills | Status: AC

## 2022-05-25 NOTE — Progress Notes (Signed)
post void residual =59m

## 2022-05-25 NOTE — Progress Notes (Signed)
Assessment: 1. Weak urinary stream - BLADDER SCAN AMB NON-IMAGING - Urinalysis, Routine w reflex microscopic  2. Urgency of urination  3. Splitting of urinary stream  4. Acute cystitis without hematuria - Urine Culture  5. Benign prostatic hyperplasia with urinary obstruction    Plan: Urine culture ordered and will begin bid for 7 days.  Macrobid sent to the patient's pharmacy adjust therapy if indicated by culture results.  If symptoms not resolving, will follow-up for cystoscopic exam with Dr. [name] in 2 weeks, otherwise will keep already scheduled post UroLift follow-up with Dr. [name] for UA and PVR. Will continue Rapaflo and finasteride until that visit.  Chief Complaint: No chief complaint on file.   HPI: Christopher [name] is a 76 y.o. male who presents for continued evaluation of BPH with obstruction.  UroLift performed on 04/16/2022 and he continues Rapaflo and finasteride.  Today, he has  new complaint of sudden onset of nocturia x1 night with 4-5 voids, significant urgency and some incontinence. This occurred 1 week ago. Since that time, he has had no further nocturia, but continues to have significant urgency, frequency, splitting of stream, dribbling.  He reports having to sit in order to void because his stream will spray around the restroom.  No gross hematuria.  No dysuria, burning.  No fever, nausea, abdominal pain.  Increasingly worsening symptoms of weakness of urinary stream,.  UA= PVR=95m   05/01/22 Christopher [name] a 76y.o. male who presents for continued evaluation of   BPH with obstruction.  He presents today for PVR and urinalysis status post UroLift procedure performed on 04/16/2022.  He is 2 weeks postop now and his only complaints are new onset of intermittency over the past several days as well as 1 episode of nocturia which she has never had.  Gross hematuria at the end of the stream occurred a few times last week, but has since resolved.  No burning,  dysuria.  He continues taking finasteride and Rapaflo.  Patient has been somewhat inactive since his last visit following nerve stimulator change in his low back. UA = clear PVR = 30 mL IPSS=7, QOL= 3   04/18/22 Christopher [name] a 76y.o. male who presents for continued evaluation of BPH with obstruction.  UroLift: Date of surgery 04/16/2022.  The patient is 2 days status post UroLift and states he has been doing well except for persistent urgency and distal penile burning.  No gross hematuria.  He has resumed his Eliquis. Voiding trial successful today.  Follow-up In 2 weeks for UA and PVR and with Dr. MAlyson Inglesin 2 months for the same.  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  Postop care for UroLift information discussed and given in written form. Portions of the above documentation were copied from a prior visit for review purposes only.  Allergies: Allergies  Allergen Reactions   Tylenol [Acetaminophen]     Stomach pain    Morphine And Related Nausea And Vomiting   Propofol Nausea And Vomiting    PMH: Past Medical History:  Diagnosis Date   [place]-fib ([place]    Arthritis    "all over my body" (06/17/2017)   BPH (benign prostatic hyperplasia)    takes Proscar daily   CAD (coronary artery disease)    a. s/p CABG in 09/2019 with SVG-PDA-OM and LIMA-LAD with LAA excision   Chronic back pain  DDD; "upper and lower" (06/17/2017)   Constipation    takes Colace daily   Coronary artery disease    DDD (degenerative disc disease), cervical    DVT (deep venous thrombosis) (HCC)    left peroneal vein 07/2017 (in setting of recent left TKA)   Dysrhythmia    history of Atrial fibrillation, no long has this   Enlarged prostate    GERD (gastroesophageal reflux disease)    takes Nexium daily   H/O hiatal hernia    History of colon polyps    benign   History of gastric ulcer     Insomnia    takes Xanax nightly   Joint capsule tear    left knee   Joint pain    Joint swelling    Macular degeneration    Muscle spasm of back    takes Valium daily as needed   PAF (paroxysmal atrial fibrillation) (HCC)    Peripheral neuropathy    takes Gabapentin daily   PONV (postoperative nausea and vomiting)    Pre-diabetes    Sleep apnea    pt does not wear a cpap   Urinary hesitancy    Weakness    numbness and tingling    PSH: Past Surgical History:  Procedure Laterality Date   ANTERIOR CERVICAL DECOMP/DISCECTOMY FUSION  ~ 1991   "took bone from hip"   ANTERIOR CERVICAL DECOMP/DISCECTOMY FUSION  X 2   "put plate in"   ANTERIOR CERVICAL DISCECTOMY  <11/2005    5-6 and 4-5./notes 05/02/2011   APPENDECTOMY     BACK SURGERY     BIOPSY  05/30/2018   Procedure: BIOPSY;  Surgeon: [name], MD;  [facility];  Service: Endoscopy;;  esophagus   BIOPSY  01/09/2022   Procedure: BIOPSY;  Surgeon: Harvel Quale, MD;  [facility];  Service: Gastroenterology;;   CARDIAC CATHETERIZATION     3-4 per pt   CARPAL TUNNEL RELEASE Bilateral 10/2005 - 11/2005   right-left /notes 05/02/2011   CATARACT EXTRACTION W/PHACO Right 05/24/2014   Procedure: CATARACT EXTRACTION PHACO AND INTRAOCULAR LENS PLACEMENT ([place]);  Surgeon: [name], MD;  Location: [facility];  Service: Ophthalmology;  Laterality: Right;  CDE 6.74   CATARACT EXTRACTION W/PHACO Left 06/17/2014   Procedure: CATARACT EXTRACTION PHACO AND INTRAOCULAR LENS PLACEMENT ([facility]);  Surgeon: [name], MD;  Location: [facility];  Service: Ophthalmology;  Laterality: Left;  CDE:4.65   COLONOSCOPY WITH ESOPHAGOGASTRODUODENOSCOPY (EGD) N/A 01/22/2013   Procedure: COLONOSCOPY WITH ESOPHAGOGASTRODUODENOSCOPY (EGD);  Surgeon: [name], MD;  [facility];  Service: Endoscopy;  Laterality: N/A;  140   COLONOSCOPY WITH PROPOFOL N/A 05/30/2018   diverticulosis and external hemorrhoids    CORONARY ARTERY BYPASS GRAFT  2020   CYSTOSCOPY WITH INSERTION OF UROLIFT N/A 04/16/2022   Procedure: CYSTOSCOPY WITH INSERTION OF UROLIFT;  Surgeon: Cleon Gustin, MD;  Location: [facility];  Service: Urology;  Laterality: N/A;   ESOPHAGOGASTRODUODENOSCOPY (EGD) WITH PROPOFOL N/A 05/30/2018   normal esophagus, presence of possible barretts (bx consistent with reflux changes, GAVE)   ESOPHAGOGASTRODUODENOSCOPY (EGD) WITH PROPOFOL N/A 01/09/2022   Procedure: ESOPHAGOGASTRODUODENOSCOPY (EGD) WITH PROPOFOL;  Surgeon: Harvel Quale, MD;  [facility];  Service: Gastroenterology;  Laterality: N/A;  305, moved up per Leigh Amm   EYE SURGERY Bilateral    "cleaned film w/laser; since my 1st cataract OR"   JOINT REPLACEMENT     LEFT HEART CATH AND CORONARY ANGIOGRAPHY N/A 02/16/2019  Procedure: LEFT HEART CATH AND CORONARY ANGIOGRAPHY;  Surgeon: Belva Crome, MD;  Location: [facility];  Service: Cardiovascular;  Laterality: N/A;   POSTERIOR LUMBAR FUSION     "put a plate in"   QUADRICEPS TENDON REPAIR Left 07/01/2017   QUADRICEPS TENDON REPAIR Left 07/01/2017   Procedure: REPAIR QUADRICEP TENDON;  Surgeon: [name], MD;  Location: [place];  Service: Orthopedics;  Laterality: Left;   SHOULDER ARTHROSCOPY Bilateral    SPINAL CORD STIMULATOR INSERTION N/A 09/23/2015   Procedure: LUMBAR SPINAL CORD STIMULATOR INSERTION;  Surgeon: [name], MD;  Location: [place] [facility];  Service: Neurosurgery;  Laterality: N/A;  LUMBAR SPINAL CORD STIMULATOR INSERTION   SPINAL CORD STIMULATOR INSERTION N/A 04/25/2022   Procedure: REVISION OF  SPINAL CORD STIMULATOR;  Surgeon: Newman Pies, MD;  Location: [place];  Service: Neurosurgery;  Laterality: N/A;   TOTAL KNEE ARTHROPLASTY Left 06/17/2017   TOTAL KNEE ARTHROPLASTY Left 06/17/2017   Procedure: TOTAL KNEE ARTHROPLASTY;  Surgeon: [name], MD;  Location: [place];  Service: Orthopedics;  Laterality: Left;   TOTAL KNEE  ARTHROPLASTY Right 12/16/2017   Procedure: TOTAL KNEE ARTHROPLASTY;  Surgeon: [name], MD;  Location: [place];  Service: Orthopedics;  Laterality: Right;    SH: Social History   Tobacco Use   Smoking status: Former    Packs/day: 1.00    Years: 30.00    Total pack years: 30.00    Types: Cigarettes    Quit date: 01/14/1987    Years since quitting: 35.3   Smokeless tobacco: Former    Types: Chew   Tobacco comments:    quit smoking  & chewing by 1987  Vaping Use   Vaping Use: Never used  Substance Use Topics   Alcohol use: Yes    Comment: 2-3 beers per night   Drug use: No    ROS: See HPI  PE: BP (!) 145/70   Pulse (!) 54   Ht '5\' 8"'$  (1.727 m)   Wt 170 lb (77.1 kg)   BMI 25.85 kg/m  GENERAL APPEARANCE:  Well appearing, well developed, well nourished, NAD HEENT:  Atraumatic, normocephalic NECK:  Supple. Trachea midline ABDOMEN:  Soft, non-tender, no masses NEUROLOGIC:  Alert and oriented x 3, normal gait MENTAL STATUS:  appropriate BACK:  Non-tender to palpation, No CVAT SKIN:  Warm, dry, and intact   Results: Laboratory Data: Lab Results  Component Value Date   WBC 8.5 04/25/2022   HGB 12.0 (L) 04/25/2022   HCT 36.3 (L) 04/25/2022   MCV 94.0 04/25/2022   PLT 293 04/25/2022    Lab Results  Component Value Date   CREATININE 0.80 04/25/2022    Lab Results  Component Value Date   HGBA1C 5.6 12/31/2020    Urinalysis    Component Value Date/Time   COLORURINE YELLOW 03/07/2014 2103   APPEARANCEUR Clear 05/01/2022 1204   LABSPEC 1.014 03/07/2014 2103   PHURINE 7.5 03/07/2014 2103   GLUCOSEU Negative 05/01/2022 1204   HGBUR NEGATIVE 03/07/2014 2103   BILIRUBINUR Negative 05/01/2022 1204   KETONESUR NEGATIVE 03/07/2014 2103   PROTEINUR Negative 05/01/2022 1204   PROTEINUR NEGATIVE 03/07/2014 2103   UROBILINOGEN 0.2 03/07/2014 2103   NITRITE Negative 05/01/2022 1204   NITRITE NEGATIVE 03/07/2014 2103   LEUKOCYTESUR Negative 05/01/2022 1204     Lab Results  Component Value Date   LABMICR Comment 05/01/2022    Pertinent Imaging: No results found for this or any previous visit.  No results found for this  or any previous visit.  No results found for this or any previous visit.  No results found for this or any previous visit.  No results found for this or any previous visit.  No results found for this or any previous visit.  No results found for this or any previous visit.  Results for orders placed during the hospital encounter of 02/21/17  CT RENAL STONE STUDY  Narrative CLINICAL DATA:  Right flank pain for 5 days, history of appendectomy  EXAM: CT ABDOMEN AND PELVIS WITHOUT CONTRAST  TECHNIQUE: Multidetector CT imaging of the abdomen and pelvis was performed following the standard protocol without IV contrast.  COMPARISON:  None.  FINDINGS: Lower chest: Lung bases shows no acute findings. Partially visualized lower thoracic spinal canal stimulator wires  Hepatobiliary: Unenhanced liver shows no biliary ductal dilatation. Question tiny noncalcified gallstones or sludge within dependent gallbladder the largest measures 3 mm.  Pancreas: Unenhanced pancreas with normal appearance.  Spleen: Unenhanced spleen with normal appearance.  Adrenals/Urinary Tract: No adrenal gland mass. Unenhanced kidneys are symmetrical in size. No hydronephrosis or hydroureter. No nephrolithiasis. No calcified ureteral calculi. No calcified calculi are noted within urinary bladder. Mild thickening of urinary bladder wall. Mild cystitis or chronic inflammation cannot be excluded.  Stomach/Bowel: There is no small bowel obstruction. The study is limited without oral or IV contrast. There is some gas in distal small bowel loops without thickening of small bowel wall. Minimal ileus cannot be excluded. There is no small bowel obstruction. No transition point in caliber of small bowel. No pericecal inflammation. The terminal  ileum is unremarkable. The patient is status post appendectomy. Some colonic stool and gas noted within right colon. There is some colonic stool and gas within descending colon and sigmoid colon. No distal colonic obstruction. No definite evidence of colitis or diverticulitis.  Vascular/Lymphatic: Atherosclerotic calcifications of abdominal aorta and iliac arteries. No adenopathy is noted. No aortic aneurysm.  Reproductive: Mild enlarged prostate gland with indentation of urinary bladder base. Prostate gland measures 3.8 by 4.3 cm.  Other: There is a left inguinal scrotal canal hernia containing fat measures 1.6 cm without evidence of acute complication. Small nonspecific bilateral inguinal lymph nodes are noted. The largest left inguinal lymph node measures 6 mm short-axis. The largest right inguinal lymph node measures 7.5 mm short-axis. These are not pathologic by size criteria.  Musculoskeletal: No destructive bony lesions are noted. Sagittal images of the spine shows degenerative changes thoracolumbar spine. Posterior fusion noted lower lumbar spine at L4-L5 level with anatomic alignment.  IMPRESSION: 1. No nephrolithiasis. No hydronephrosis or hydroureter. Question tiny layering sludge or noncalcified gallstones within gallbladder the largest measures 3 mm. 2. No calcified calculi are noted within urinary bladder. Mild thickening of urinary bladder wall. Mild cystitis or chronic inflammation cannot be excluded. Clinical correlation is necessary. 3. Some gas noted within distal small bowel loops without significant small bowel distension. No transition point in caliber of small bowel. Mild ileus cannot be excluded. No definite evidence of small bowel obstruction. 4. No pericecal inflammation. The patient is status post appendectomy. Unremarkable terminal ileum. Moderate stool noted in cecum and proximal right colon. 5. No calcified ureteral calculi are noted. 6. Mild  enlarged prostate gland with indentation of the bladder base. 7. Small nonspecific bilateral inguinal lymph nodes are noted. There is a small left inguinal scrotal canal hernia containing fat measures 1.6 cm no evidence of acute complication.   Electronically Signed By: Lahoma Crocker M.D. On: 02/22/2017 09:02  No results found for this or any previous visit (from the past 24 hour(s)).

## 2022-05-29 DIAGNOSIS — R6889 Other general symptoms and signs: Secondary | ICD-10-CM | POA: Diagnosis not present

## 2022-05-29 DIAGNOSIS — Z6823 Body mass index (BMI) 23.0-23.9, adult: Secondary | ICD-10-CM | POA: Diagnosis not present

## 2022-05-29 DIAGNOSIS — D649 Anemia, unspecified: Secondary | ICD-10-CM | POA: Diagnosis not present

## 2022-05-30 DIAGNOSIS — R6889 Other general symptoms and signs: Secondary | ICD-10-CM | POA: Diagnosis not present

## 2022-05-30 DIAGNOSIS — Z0001 Encounter for general adult medical examination with abnormal findings: Secondary | ICD-10-CM | POA: Diagnosis not present

## 2022-05-30 DIAGNOSIS — D649 Anemia, unspecified: Secondary | ICD-10-CM | POA: Diagnosis not present

## 2022-05-30 DIAGNOSIS — E559 Vitamin D deficiency, unspecified: Secondary | ICD-10-CM | POA: Diagnosis not present

## 2022-05-30 LAB — URINE CULTURE

## 2022-06-07 ENCOUNTER — Ambulatory Visit: Payer: Medicare Other | Admitting: Physician Assistant

## 2022-06-13 ENCOUNTER — Other Ambulatory Visit: Payer: Self-pay | Admitting: Internal Medicine

## 2022-06-13 ENCOUNTER — Other Ambulatory Visit (HOSPITAL_COMMUNITY): Payer: Self-pay | Admitting: Internal Medicine

## 2022-06-13 DIAGNOSIS — R748 Abnormal levels of other serum enzymes: Secondary | ICD-10-CM

## 2022-06-17 ENCOUNTER — Other Ambulatory Visit: Payer: Self-pay | Admitting: Urology

## 2022-06-26 ENCOUNTER — Ambulatory Visit (HOSPITAL_COMMUNITY)
Admission: RE | Admit: 2022-06-26 | Discharge: 2022-06-26 | Disposition: A | Discharge status: Home / Self Care | Payer: Medicare Other | Source: Ambulatory Visit | Attending: Internal Medicine | Admitting: Internal Medicine

## 2022-06-26 DIAGNOSIS — R748 Abnormal levels of other serum enzymes: Secondary | ICD-10-CM | POA: Diagnosis not present

## 2022-06-27 ENCOUNTER — Ambulatory Visit: Payer: Medicare Other | Admitting: Urology

## 2022-06-27 ENCOUNTER — Encounter: Payer: Self-pay | Admitting: Urology

## 2022-06-27 VITALS — BP 115/64 | HR 64

## 2022-06-27 DIAGNOSIS — N3000 Acute cystitis without hematuria: Secondary | ICD-10-CM

## 2022-06-27 DIAGNOSIS — N401 Enlarged prostate with lower urinary tract symptoms: Secondary | ICD-10-CM | POA: Diagnosis not present

## 2022-06-27 DIAGNOSIS — R351 Nocturia: Secondary | ICD-10-CM

## 2022-06-27 LAB — BLADDER SCAN AMB NON-IMAGING: Scan Result: 14

## 2022-06-27 LAB — URINALYSIS, ROUTINE W REFLEX MICROSCOPIC
Bilirubin, UA: NEGATIVE
Glucose, UA: NEGATIVE
Nitrite, UA: POSITIVE — AB
Specific Gravity, UA: 1.015 (ref 1.005–1.030)
Urobilinogen, Ur: 0.2 mg/dL (ref 0.2–1.0)
pH, UA: 5.5 (ref 5.0–7.5)

## 2022-06-27 LAB — MICROSCOPIC EXAMINATION
Renal Epithel, UA: NONE SEEN /hpf
WBC, UA: >30 /hpf — AB (ref 0–5)

## 2022-06-27 MED ORDER — SULFAMETHOXAZOLE-TRIMETHOPRIM 800-160 MG PO TABS
1.0000 | ORAL_TABLET | Freq: Two times a day (BID) | ORAL | 0 refills | Status: DC
Start: 2022-06-27 — End: 2022-08-24

## 2022-06-27 NOTE — Progress Notes (Signed)
post void residual= 14

## 2022-06-27 NOTE — Patient Instructions (Signed)

## 2022-06-27 NOTE — Progress Notes (Signed)
06/27/2022 10:51 AM   Christopher [name] Aug 05, 1946 [id number]  Referring provider: Redmond School, MD [address] [place],  [place] [address]  Followup BPH   HPI: Christopher [name] is a 76yo here for followup for BPH and a weak urinary stream. UA is concerning for infection. He had gross hematuria 3-4 weeks ago. He has had foul smelling urine for the past week. He notes his urine stream is weaker over the past week. He has straining to urinate. Nocturia 1-2x.    PMH: Past Medical History:  Diagnosis Date   A-fib [facility])    Arthritis    "all over my body" (06/17/2017)   BPH (benign prostatic hyperplasia)    takes Proscar daily   CAD (coronary artery disease)    a. s/p CABG in 09/2019 with SVG-PDA-OM and LIMA-LAD with LAA excision   Chronic back pain    DDD; "upper and lower" (06/17/2017)   Constipation    takes Colace daily   Coronary artery disease    DDD (degenerative disc disease), cervical    DVT (deep venous thrombosis) (HCC)    left peroneal vein 07/2017 (in setting of recent left TKA)   Dysrhythmia    history of Atrial fibrillation, no long has this   Enlarged prostate    GERD (gastroesophageal reflux disease)    takes Nexium daily   H/O hiatal hernia    History of colon polyps    benign   History of gastric ulcer    Insomnia    takes Xanax nightly   Joint capsule tear    left knee   Joint pain    Joint swelling    Macular degeneration    Muscle spasm of back    takes Valium daily as needed   PAF (paroxysmal atrial fibrillation) (HCC)    Peripheral neuropathy    takes Gabapentin daily   PONV (postoperative nausea and vomiting)    Pre-diabetes    Sleep apnea    pt does not wear a cpap   Urinary hesitancy    Weakness    numbness and tingling    Surgical History: Past Surgical History:  Procedure Laterality Date   ANTERIOR CERVICAL DECOMP/DISCECTOMY FUSION  ~ 1991   "took bone from hip"   ANTERIOR CERVICAL DECOMP/DISCECTOMY FUSION  X 2   "put plate in"    ANTERIOR CERVICAL DISCECTOMY  <11/2005    5-6 and 4-5./notes 05/02/2011   APPENDECTOMY     BACK SURGERY     BIOPSY  05/30/2018   Procedure: BIOPSY;  Surgeon: [name], MD;  [facility];  Service: Endoscopy;;  esophagus   BIOPSY  01/09/2022   Procedure: BIOPSY;  Surgeon: Harvel Quale, MD;  [facility];  Service: Gastroenterology;;   CARDIAC CATHETERIZATION     3-4 per pt   CARPAL TUNNEL RELEASE Bilateral 10/2005 - 11/2005   right-left /notes 05/02/2011   CATARACT EXTRACTION W/PHACO Right 05/24/2014   Procedure: CATARACT EXTRACTION PHACO AND INTRAOCULAR LENS PLACEMENT ([place]);  Surgeon: [name], MD;  Location: [facility];  Service: Ophthalmology;  Laterality: Right;  CDE 6.74   CATARACT EXTRACTION W/PHACO Left 06/17/2014   Procedure: CATARACT EXTRACTION PHACO AND INTRAOCULAR LENS PLACEMENT ([facility]);  Surgeon: [name], MD;  Location: [facility];  Service: Ophthalmology;  Laterality: Left;  CDE:4.65   COLONOSCOPY WITH ESOPHAGOGASTRODUODENOSCOPY (EGD) N/A 01/22/2013   Procedure: COLONOSCOPY WITH ESOPHAGOGASTRODUODENOSCOPY (EGD);  Surgeon: [name], MD;  [facility];  Service: Endoscopy;  Laterality: N/A;  140   COLONOSCOPY WITH PROPOFOL N/A 05/30/2018   diverticulosis and external hemorrhoids   CORONARY ARTERY BYPASS GRAFT  2020   CYSTOSCOPY WITH INSERTION OF UROLIFT N/A 04/16/2022   Procedure: CYSTOSCOPY WITH INSERTION OF UROLIFT;  Surgeon: Cleon Gustin, MD;  Location: [facility];  Service: Urology;  Laterality: N/A;   ESOPHAGOGASTRODUODENOSCOPY (EGD) WITH PROPOFOL N/A 05/30/2018   normal esophagus, presence of possible barretts (bx consistent with reflux changes, GAVE)   ESOPHAGOGASTRODUODENOSCOPY (EGD) WITH PROPOFOL N/A 01/09/2022   Procedure: ESOPHAGOGASTRODUODENOSCOPY (EGD) WITH PROPOFOL;  Surgeon: Harvel Quale, MD;  [facility];  Service: Gastroenterology;  Laterality: N/A;  305, moved up per Leigh Amm   EYE  SURGERY Bilateral    "cleaned film w/laser; since my 1st cataract OR"   JOINT REPLACEMENT     LEFT HEART CATH AND CORONARY ANGIOGRAPHY N/A 02/16/2019   Procedure: LEFT HEART CATH AND CORONARY ANGIOGRAPHY;  Surgeon: Belva Crome, MD;  Location: [place] [facility];  Service: Cardiovascular;  Laterality: N/A;   POSTERIOR LUMBAR FUSION     "put a plate in"   QUADRICEPS TENDON REPAIR Left 07/01/2017   QUADRICEPS TENDON REPAIR Left 07/01/2017   Procedure: REPAIR QUADRICEP TENDON;  Surgeon: [name], MD;  Location: [place];  Service: Orthopedics;  Laterality: Left;   SHOULDER ARTHROSCOPY Bilateral    SPINAL CORD STIMULATOR INSERTION N/A 09/23/2015   Procedure: LUMBAR SPINAL CORD STIMULATOR INSERTION;  Surgeon: [name], MD;  Location: [place] [facility];  Service: Neurosurgery;  Laterality: N/A;  LUMBAR SPINAL CORD STIMULATOR INSERTION   SPINAL CORD STIMULATOR INSERTION N/A 04/25/2022   Procedure: REVISION OF  SPINAL CORD STIMULATOR;  Surgeon: Newman Pies, MD;  Location: [place];  Service: Neurosurgery;  Laterality: N/A;   TOTAL KNEE ARTHROPLASTY Left 06/17/2017   TOTAL KNEE ARTHROPLASTY Left 06/17/2017   Procedure: TOTAL KNEE ARTHROPLASTY;  Surgeon: [name], MD;  Location: [place];  Service: Orthopedics;  Laterality: Left;   TOTAL KNEE ARTHROPLASTY Right 12/16/2017   Procedure: TOTAL KNEE ARTHROPLASTY;  Surgeon: [name], MD;  Location: [place];  Service: Orthopedics;  Laterality: Right;    Home Medications:  Allergies as of 06/27/2022       Reactions   Tylenol [acetaminophen]    Stomach pain    Morphine And Related Nausea And Vomiting   Propofol Nausea And Vomiting        Medication List        Accurate as of June 27, 2022 10:51 AM. If you have any questions, ask your nurse or doctor.          ALPRAZolam 1 MG tablet Commonly known as: XANAX Take 1 mg by mouth at bedtime.   amiodarone 200 MG tablet Commonly known as: PACERONE TAKE 1 TABLET BY MOUTH EVERY DAY    apixaban 5 MG Tabs tablet Commonly known as: ELIQUIS Take 1 tablet (5 mg total) by mouth 2 (two) times daily. *Restart 04/28/2022*   aspirin EC 81 MG tablet Take 1 tablet (81 mg total) by mouth daily.   atorvastatin 80 MG tablet Commonly known as: LIPITOR Take 1 tablet (80 mg total) by mouth daily.   B-12 PO Take 1 tablet by mouth daily.   docusate sodium 250 MG capsule Commonly known as: COLACE Take 250 mg by mouth daily.   FIBER-CAPS PO Take 2 capsules by mouth daily.   finasteride 5 MG tablet Commonly known as: PROSCAR Take 1 tablet (5 mg total) by mouth daily.  furosemide 20 MG tablet Commonly known as: LASIX Take 1 tablet (20 mg total) by mouth daily.   gabapentin 400 MG capsule Commonly known as: NEURONTIN One three times a day What changed:  how much to take how to take this when to take this additional instructions   lubiprostone 24 MCG capsule Commonly known as: Amitiza Take 1 capsule (24 mcg total) by mouth 2 (two) times daily with a meal.   nitroGLYCERIN 0.4 MG SL tablet Commonly known as: NITROSTAT Place 1 tablet (0.4 mg total) under the tongue every 5 (five) minutes as needed for chest pain.   Omega-3 1000 MG Caps Take 1,000 mg by mouth daily.   pantoprazole 40 MG tablet Commonly known as: PROTONIX TAKE 1 TABLET BY MOUTH TWICE A DAY (30 TO 60 MINUTES BEFORE 1ST AND LAST MEALS OF THE DAY) What changed:  how much to take when to take this additional instructions   potassium chloride 10 MEQ tablet Commonly known as: KLOR-CON M Take 10 mEq by mouth daily.   silodosin 8 MG Caps capsule Commonly known as: RAPAFLO Take 1 capsule (8 mg total) by mouth at bedtime.   Simethicone 180 MG Caps Take 1 capsule (180 mg total) by mouth 3 (three) times daily as needed. What changed: reasons to take this   Systane Balance 0.6 % Soln Generic drug: Propylene Glycol Place 1 drop into both eyes daily as needed (dry eyes).   traMADol 50 MG  tablet Commonly known as: ULTRAM Take 1-2 tablets (50-100 mg total) by mouth every 4 (four) hours as needed.   Vitamin D3 10 MCG (400 UNIT) Caps Take 400 Units by mouth daily.   vitamin E 200 UNIT capsule Take 200 Units by mouth daily.   ZINC 15 PO Take 15 mg by mouth daily.        Allergies:  Allergies  Allergen Reactions   Tylenol [Acetaminophen]     Stomach pain    Morphine And Related Nausea And Vomiting   Propofol Nausea And Vomiting    Family History: No family history on file.  Social History:  reports that he quit smoking about 35 years ago. His smoking use included cigarettes. He has a 30.00 pack-year smoking history. He has quit using smokeless tobacco.  His smokeless tobacco use included chew. He reports current alcohol use. He reports that he does not use drugs.  ROS: All other review of systems were reviewed and are negative except what is noted above in HPI  Physical Exam: BP 115/64   Pulse 64   Constitutional:  Alert and oriented, No acute distress. HEENT: [place] AT, moist mucus membranes.  Trachea midline, no masses. Cardiovascular: No clubbing, cyanosis, or edema. Respiratory: Normal respiratory effort, no increased work of breathing. GI: Abdomen is soft, nontender, nondistended, no abdominal masses GU: No CVA tenderness.  Lymph: No cervical or inguinal lymphadenopathy. Skin: No rashes, bruises or suspicious lesions. Neurologic: Grossly intact, no focal deficits, moving all 4 extremities. Psychiatric: Normal mood and affect.  Laboratory Data: Lab Results  Component Value Date   WBC 8.5 04/25/2022   HGB 12.0 (L) 04/25/2022   HCT 36.3 (L) 04/25/2022   MCV 94.0 04/25/2022   PLT 293 04/25/2022    Lab Results  Component Value Date   CREATININE 0.80 04/25/2022    No results found for: "PSA"  No results found for: "TESTOSTERONE"  Lab Results  Component Value Date   HGBA1C 5.6 12/31/2020    Urinalysis    Component Value Date/Time  COLORURINE YELLOW 03/07/2014 2103   APPEARANCEUR Clear 05/25/2022 1224   LABSPEC 1.014 03/07/2014 2103   PHURINE 7.5 03/07/2014 2103   GLUCOSEU Negative 05/25/2022 1224   HGBUR NEGATIVE 03/07/2014 2103   BILIRUBINUR Negative 05/25/2022 [place] 03/07/2014 2103   PROTEINUR Negative 05/25/2022 [place] 03/07/2014 2103   UROBILINOGEN 0.2 03/07/2014 2103   NITRITE Negative 05/25/2022 1224   NITRITE NEGATIVE 03/07/2014 2103   LEUKOCYTESUR 2+ (A) 05/25/2022 1224    Lab Results  Component Value Date   LABMICR See below: 05/25/2022   WBCUA >30 (A) 05/25/2022   LABEPIT 0-10 05/25/2022   BACTERIA Moderate (A) 05/25/2022    Pertinent Imaging:  No results found for this or any previous visit.  No results found for this or any previous visit.  No results found for this or any previous visit.  No results found for this or any previous visit.  No results found for this or any previous visit.  No results found for this or any previous visit.  No results found for this or any previous visit.  Results for orders placed during the hospital encounter of 02/21/17  CT RENAL STONE STUDY  Narrative CLINICAL DATA:  Right flank pain for 5 days, history of appendectomy  EXAM: CT ABDOMEN AND PELVIS WITHOUT CONTRAST  TECHNIQUE: Multidetector CT imaging of the abdomen and pelvis was performed following the standard protocol without IV contrast.  COMPARISON:  None.  FINDINGS: Lower chest: Lung bases shows no acute findings. Partially visualized lower thoracic spinal canal stimulator wires  Hepatobiliary: Unenhanced liver shows no biliary ductal dilatation. Question tiny noncalcified gallstones or sludge within dependent gallbladder the largest measures 3 mm.  Pancreas: Unenhanced pancreas with normal appearance.  Spleen: Unenhanced spleen with normal appearance.  Adrenals/Urinary Tract: No adrenal gland mass. Unenhanced kidneys are symmetrical  in size. No hydronephrosis or hydroureter. No nephrolithiasis. No calcified ureteral calculi. No calcified calculi are noted within urinary bladder. Mild thickening of urinary bladder wall. Mild cystitis or chronic inflammation cannot be excluded.  Stomach/Bowel: There is no small bowel obstruction. The study is limited without oral or IV contrast. There is some gas in distal small bowel loops without thickening of small bowel wall. Minimal ileus cannot be excluded. There is no small bowel obstruction. No transition point in caliber of small bowel. No pericecal inflammation. The terminal ileum is unremarkable. The patient is status post appendectomy. Some colonic stool and gas noted within right colon. There is some colonic stool and gas within descending colon and sigmoid colon. No distal colonic obstruction. No definite evidence of colitis or diverticulitis.  Vascular/Lymphatic: Atherosclerotic calcifications of abdominal aorta and iliac arteries. No adenopathy is noted. No aortic aneurysm.  Reproductive: Mild enlarged prostate gland with indentation of urinary bladder base. Prostate gland measures 3.8 by 4.3 cm.  Other: There is a left inguinal scrotal canal hernia containing fat measures 1.6 cm without evidence of acute complication. Small nonspecific bilateral inguinal lymph nodes are noted. The largest left inguinal lymph node measures 6 mm short-axis. The largest right inguinal lymph node measures 7.5 mm short-axis. These are not pathologic by size criteria.  Musculoskeletal: No destructive bony lesions are noted. Sagittal images of the spine shows degenerative changes thoracolumbar spine. Posterior fusion noted lower lumbar spine at L4-L5 level with anatomic alignment.  IMPRESSION: 1. No nephrolithiasis. No hydronephrosis or hydroureter. Question tiny layering sludge or noncalcified gallstones within gallbladder the largest measures 3 mm. 2. No calcified calculi are  noted within urinary bladder. Mild thickening of urinary bladder wall. Mild cystitis or chronic inflammation cannot be excluded. Clinical correlation is necessary. 3. Some gas noted within distal small bowel loops without significant small bowel distension. No transition point in caliber of small bowel. Mild ileus cannot be excluded. No definite evidence of small bowel obstruction. 4. No pericecal inflammation. The patient is status post appendectomy. Unremarkable terminal ileum. Moderate stool noted in cecum and proximal right colon. 5. No calcified ureteral calculi are noted. 6. Mild enlarged prostate gland with indentation of the bladder base. 7. Small nonspecific bilateral inguinal lymph nodes are noted. There is a small left inguinal scrotal canal hernia containing fat measures 1.6 cm no evidence of acute complication.   Electronically Signed By: Lahoma Crocker M.D. On: 02/22/2017 09:02   Assessment & Plan:    1. BPh with Nocturia -Urine for culture. Bactrim DS BID for 21 days -RTC 4 weeks for cystoscopy - BLADDER SCAN AMB NON-IMAGING - Urinalysis, Routine w reflex microscopic   No follow-ups on file.  Nicolette Bang, MD  [facility] [place]

## 2022-06-29 ENCOUNTER — Telehealth: Payer: Self-pay

## 2022-06-29 LAB — URINE CULTURE

## 2022-06-29 MED ORDER — NITROFURANTOIN MONOHYD MACRO 100 MG PO CAPS: 100.0000 mg | ORAL_CAPSULE | Freq: Two times a day (BID) | ORAL | 0 refills | Status: DC

## 2022-06-29 NOTE — Telephone Encounter (Signed)
Antonietta Jewel reviewed 07/12 culture in office and gave verbal to order Macrobid '100mg'$  and to stop Bactrim DS.  Rx sent to pharmacy and patient aware.

## 2022-07-04 ENCOUNTER — Other Ambulatory Visit: Payer: Self-pay | Admitting: Urology

## 2022-07-13 DIAGNOSIS — R748 Abnormal levels of other serum enzymes: Secondary | ICD-10-CM | POA: Diagnosis not present

## 2022-07-13 DIAGNOSIS — K5909 Other constipation: Secondary | ICD-10-CM | POA: Diagnosis not present

## 2022-07-13 DIAGNOSIS — R7309 Other abnormal glucose: Secondary | ICD-10-CM | POA: Diagnosis not present

## 2022-07-21 DIAGNOSIS — L282 Other prurigo: Secondary | ICD-10-CM | POA: Diagnosis not present

## 2022-07-29 ENCOUNTER — Other Ambulatory Visit: Payer: Self-pay | Admitting: Urology

## 2022-08-01 ENCOUNTER — Ambulatory Visit: Payer: Medicare Other | Admitting: Urology

## 2022-08-01 VITALS — BP 119/71 | HR 62

## 2022-08-01 DIAGNOSIS — R351 Nocturia: Secondary | ICD-10-CM | POA: Diagnosis not present

## 2022-08-01 DIAGNOSIS — N138 Other obstructive and reflux uropathy: Secondary | ICD-10-CM

## 2022-08-01 DIAGNOSIS — N401 Enlarged prostate with lower urinary tract symptoms: Secondary | ICD-10-CM

## 2022-08-01 MED ORDER — CIPROFLOXACIN HCL 500 MG PO TABS
500.0000 mg | ORAL_TABLET | Freq: Once | ORAL | Status: AC
  Administered 2022-08-01: 500 mg via ORAL

## 2022-08-01 NOTE — Progress Notes (Signed)
   08/01/22  CC: nocturia  HPI: Mr Pizzo is a 76yo here for cystoscopy for continued LUTS after Urolift.  Blood pressure 119/71, pulse 62. NED. A&Ox3.   No respiratory distress   Abd soft, NT, ND Normal phallus with bilateral descended testicles  Cystoscopy Procedure Note  Patient identification was confirmed, informed consent was obtained, and patient was prepped using Betadine solution.  Lidocaine jelly was administered per urethral meatus.     Pre-Procedure: - Inspection reveals a normal caliber ureteral meatus.  Procedure: The flexible cystoscope was introduced without difficulty - Fossa navicularis stricture noted and urethra dilated from 8 french to 16 french with sequential dilators. - No urethral strictures/lesions are present. - Enlarged prostate mild erythema of the lateral lobes - Normal bladder neck - Bilateral ureteral orifices identified - Bladder mucosa  reveals no ulcers, tumors, or lesions - No bladder stones - No trabeculation     Post-Procedure: - Patient tolerated the procedure well  Assessment/ Plan: Patient to perform self urethral dilation 3 times per week. RTC 3-4 weeks  No follow-ups on file.  Nicolette Bang, MD

## 2022-08-01 NOTE — Patient Instructions (Signed)
You will self cath on Monday, Wednesday and Friday's. You will do this only once a day on Monday, Wednesday and Friday until you come back to see Dr. [name]

## 2022-08-02 LAB — URINALYSIS, ROUTINE W REFLEX MICROSCOPIC
Bilirubin, UA: NEGATIVE
Glucose, UA: NEGATIVE
Ketones, UA: NEGATIVE
Nitrite, UA: NEGATIVE
Protein,UA: NEGATIVE
RBC, UA: NEGATIVE
Specific Gravity, UA: 1.015 (ref 1.005–1.030)
Urobilinogen, Ur: 0.2 mg/dL (ref 0.2–1.0)
pH, UA: 5.5 (ref 5.0–7.5)

## 2022-08-02 LAB — MICROSCOPIC EXAMINATION: WBC, UA: >30 /hpf — AB (ref 0–5)

## 2022-08-07 ENCOUNTER — Encounter: Payer: Self-pay | Admitting: Urology

## 2022-08-17 DIAGNOSIS — R6889 Other general symptoms and signs: Secondary | ICD-10-CM | POA: Diagnosis not present

## 2022-08-17 DIAGNOSIS — Z6823 Body mass index (BMI) 23.0-23.9, adult: Secondary | ICD-10-CM | POA: Diagnosis not present

## 2022-08-17 DIAGNOSIS — R946 Abnormal results of thyroid function studies: Secondary | ICD-10-CM | POA: Diagnosis not present

## 2022-08-17 DIAGNOSIS — G4700 Insomnia, unspecified: Secondary | ICD-10-CM | POA: Diagnosis not present

## 2022-08-17 DIAGNOSIS — D649 Anemia, unspecified: Secondary | ICD-10-CM | POA: Diagnosis not present

## 2022-08-17 DIAGNOSIS — E782 Mixed hyperlipidemia: Secondary | ICD-10-CM | POA: Diagnosis not present

## 2022-08-17 DIAGNOSIS — R748 Abnormal levels of other serum enzymes: Secondary | ICD-10-CM | POA: Diagnosis not present

## 2022-08-17 DIAGNOSIS — R7309 Other abnormal glucose: Secondary | ICD-10-CM | POA: Diagnosis not present

## 2022-08-19 ENCOUNTER — Emergency Department (HOSPITAL_COMMUNITY): Payer: Medicare Other

## 2022-08-19 ENCOUNTER — Inpatient Hospital Stay (HOSPITAL_COMMUNITY)
Admission: EM | Admit: 2022-08-19 | Discharge: 2022-08-24 | DRG: 698 | Disposition: A | Discharge status: Home / Self Care | Payer: Medicare Other | Attending: Internal Medicine | Admitting: Internal Medicine

## 2022-08-19 ENCOUNTER — Inpatient Hospital Stay (HOSPITAL_COMMUNITY): Payer: Medicare Other

## 2022-08-19 ENCOUNTER — Encounter (HOSPITAL_COMMUNITY): Payer: Self-pay | Admitting: *Deleted

## 2022-08-19 ENCOUNTER — Other Ambulatory Visit: Payer: Self-pay

## 2022-08-19 DIAGNOSIS — T83518A Infection and inflammatory reaction due to other urinary catheter, initial encounter: Principal | ICD-10-CM | POA: Diagnosis present

## 2022-08-19 DIAGNOSIS — Z981 Arthrodesis status: Secondary | ICD-10-CM

## 2022-08-19 DIAGNOSIS — I2510 Atherosclerotic heart disease of native coronary artery without angina pectoris: Secondary | ICD-10-CM | POA: Diagnosis not present

## 2022-08-19 DIAGNOSIS — I10 Essential (primary) hypertension: Secondary | ICD-10-CM | POA: Diagnosis present

## 2022-08-19 DIAGNOSIS — Z87891 Personal history of nicotine dependence: Secondary | ICD-10-CM

## 2022-08-19 DIAGNOSIS — K810 Acute cholecystitis: Secondary | ICD-10-CM | POA: Diagnosis present

## 2022-08-19 DIAGNOSIS — Z7901 Long term (current) use of anticoagulants: Secondary | ICD-10-CM | POA: Diagnosis not present

## 2022-08-19 DIAGNOSIS — R001 Bradycardia, unspecified: Secondary | ICD-10-CM | POA: Diagnosis not present

## 2022-08-19 DIAGNOSIS — E872 Acidosis, unspecified: Secondary | ICD-10-CM | POA: Diagnosis present

## 2022-08-19 DIAGNOSIS — Z0189 Encounter for other specified special examinations: Secondary | ICD-10-CM | POA: Diagnosis not present

## 2022-08-19 DIAGNOSIS — K8020 Calculus of gallbladder without cholecystitis without obstruction: Secondary | ICD-10-CM

## 2022-08-19 DIAGNOSIS — I25119 Atherosclerotic heart disease of native coronary artery with unspecified angina pectoris: Secondary | ICD-10-CM | POA: Diagnosis present

## 2022-08-19 DIAGNOSIS — R6520 Severe sepsis without septic shock: Secondary | ICD-10-CM

## 2022-08-19 DIAGNOSIS — Z9842 Cataract extraction status, left eye: Secondary | ICD-10-CM

## 2022-08-19 DIAGNOSIS — Z20822 Contact with and (suspected) exposure to covid-19: Secondary | ICD-10-CM | POA: Diagnosis not present

## 2022-08-19 DIAGNOSIS — Y846 Urinary catheterization as the cause of abnormal reaction of the patient, or of later complication, without mention of misadventure at the time of the procedure: Secondary | ICD-10-CM | POA: Diagnosis present

## 2022-08-19 DIAGNOSIS — E785 Hyperlipidemia, unspecified: Secondary | ICD-10-CM | POA: Diagnosis not present

## 2022-08-19 DIAGNOSIS — N368 Other specified disorders of urethra: Secondary | ICD-10-CM | POA: Diagnosis present

## 2022-08-19 DIAGNOSIS — G8929 Other chronic pain: Secondary | ICD-10-CM | POA: Diagnosis present

## 2022-08-19 DIAGNOSIS — K8012 Calculus of gallbladder with acute and chronic cholecystitis without obstruction: Secondary | ICD-10-CM | POA: Diagnosis present

## 2022-08-19 DIAGNOSIS — A4181 Sepsis due to Enterococcus: Secondary | ICD-10-CM | POA: Diagnosis not present

## 2022-08-19 DIAGNOSIS — N3001 Acute cystitis with hematuria: Secondary | ICD-10-CM | POA: Diagnosis not present

## 2022-08-19 DIAGNOSIS — R7303 Prediabetes: Secondary | ICD-10-CM | POA: Diagnosis present

## 2022-08-19 DIAGNOSIS — G4700 Insomnia, unspecified: Secondary | ICD-10-CM | POA: Diagnosis present

## 2022-08-19 DIAGNOSIS — I482 Chronic atrial fibrillation, unspecified: Secondary | ICD-10-CM | POA: Diagnosis not present

## 2022-08-19 DIAGNOSIS — D6832 Hemorrhagic disorder due to extrinsic circulating anticoagulants: Secondary | ICD-10-CM | POA: Diagnosis present

## 2022-08-19 DIAGNOSIS — N179 Acute kidney failure, unspecified: Secondary | ICD-10-CM | POA: Diagnosis not present

## 2022-08-19 DIAGNOSIS — Z8711 Personal history of peptic ulcer disease: Secondary | ICD-10-CM

## 2022-08-19 DIAGNOSIS — R319 Hematuria, unspecified: Secondary | ICD-10-CM | POA: Diagnosis present

## 2022-08-19 DIAGNOSIS — Z951 Presence of aortocoronary bypass graft: Secondary | ICD-10-CM

## 2022-08-19 DIAGNOSIS — N400 Enlarged prostate without lower urinary tract symptoms: Secondary | ICD-10-CM | POA: Diagnosis present

## 2022-08-19 DIAGNOSIS — I480 Paroxysmal atrial fibrillation: Secondary | ICD-10-CM | POA: Diagnosis not present

## 2022-08-19 DIAGNOSIS — A419 Sepsis, unspecified organism: Secondary | ICD-10-CM | POA: Diagnosis not present

## 2022-08-19 DIAGNOSIS — Z79899 Other long term (current) drug therapy: Secondary | ICD-10-CM

## 2022-08-19 DIAGNOSIS — Z8719 Personal history of other diseases of the digestive system: Secondary | ICD-10-CM

## 2022-08-19 DIAGNOSIS — M549 Dorsalgia, unspecified: Secondary | ICD-10-CM | POA: Diagnosis present

## 2022-08-19 DIAGNOSIS — R7881 Bacteremia: Secondary | ICD-10-CM | POA: Diagnosis present

## 2022-08-19 DIAGNOSIS — G4733 Obstructive sleep apnea (adult) (pediatric): Secondary | ICD-10-CM | POA: Diagnosis not present

## 2022-08-19 DIAGNOSIS — K219 Gastro-esophageal reflux disease without esophagitis: Secondary | ICD-10-CM | POA: Diagnosis present

## 2022-08-19 DIAGNOSIS — G629 Polyneuropathy, unspecified: Secondary | ICD-10-CM | POA: Diagnosis present

## 2022-08-19 DIAGNOSIS — Z96653 Presence of artificial knee joint, bilateral: Secondary | ICD-10-CM | POA: Diagnosis present

## 2022-08-19 DIAGNOSIS — H3530 Unspecified macular degeneration: Secondary | ICD-10-CM | POA: Diagnosis present

## 2022-08-19 DIAGNOSIS — Z961 Presence of intraocular lens: Secondary | ICD-10-CM | POA: Diagnosis present

## 2022-08-19 DIAGNOSIS — N320 Bladder-neck obstruction: Secondary | ICD-10-CM

## 2022-08-19 DIAGNOSIS — R509 Fever, unspecified: Secondary | ICD-10-CM | POA: Diagnosis not present

## 2022-08-19 DIAGNOSIS — Z01810 Encounter for preprocedural cardiovascular examination: Secondary | ICD-10-CM | POA: Diagnosis not present

## 2022-08-19 DIAGNOSIS — N12 Tubulo-interstitial nephritis, not specified as acute or chronic: Secondary | ICD-10-CM | POA: Diagnosis not present

## 2022-08-19 DIAGNOSIS — Z9841 Cataract extraction status, right eye: Secondary | ICD-10-CM

## 2022-08-19 DIAGNOSIS — Z7982 Long term (current) use of aspirin: Secondary | ICD-10-CM

## 2022-08-19 DIAGNOSIS — K828 Other specified diseases of gallbladder: Secondary | ICD-10-CM | POA: Diagnosis not present

## 2022-08-19 DIAGNOSIS — R0600 Dyspnea, unspecified: Secondary | ICD-10-CM | POA: Diagnosis not present

## 2022-08-19 DIAGNOSIS — R000 Tachycardia, unspecified: Secondary | ICD-10-CM | POA: Diagnosis not present

## 2022-08-19 DIAGNOSIS — N390 Urinary tract infection, site not specified: Secondary | ICD-10-CM | POA: Diagnosis present

## 2022-08-19 DIAGNOSIS — R188 Other ascites: Secondary | ICD-10-CM | POA: Diagnosis not present

## 2022-08-19 DIAGNOSIS — R0902 Hypoxemia: Secondary | ICD-10-CM | POA: Diagnosis not present

## 2022-08-19 DIAGNOSIS — K6389 Other specified diseases of intestine: Secondary | ICD-10-CM | POA: Diagnosis not present

## 2022-08-19 DIAGNOSIS — R7401 Elevation of levels of liver transaminase levels: Secondary | ICD-10-CM | POA: Diagnosis present

## 2022-08-19 DIAGNOSIS — J9811 Atelectasis: Secondary | ICD-10-CM | POA: Diagnosis not present

## 2022-08-19 DIAGNOSIS — I4891 Unspecified atrial fibrillation: Secondary | ICD-10-CM | POA: Diagnosis not present

## 2022-08-19 LAB — COMPREHENSIVE METABOLIC PANEL
ALT: 69 U/L — ABNORMAL HIGH (ref 0–44)
AST: 65 U/L — ABNORMAL HIGH (ref 15–41)
Albumin: 3.7 g/dL (ref 3.5–5.0)
Alkaline Phosphatase: 126 U/L (ref 38–126)
Anion gap: 10 (ref 5–15)
BUN: 12 mg/dL (ref 8–23)
CO2: 23 mmol/L (ref 22–32)
Calcium: 9 mg/dL (ref 8.9–10.3)
Chloride: 107 mmol/L (ref 98–111)
Creatinine, Ser: 1.07 mg/dL (ref 0.61–1.24)
GFR, Estimated: >60 mL/min (ref >60)
Glucose, Bld: 100 mg/dL — ABNORMAL HIGH (ref 70–99)
Potassium: 3.9 mmol/L (ref 3.5–5.1)
Sodium: 140 mmol/L (ref 135–145)
Total Bilirubin: 1.2 mg/dL (ref 0.3–1.2)
Total Protein: 6.9 g/dL (ref 6.5–8.1)

## 2022-08-19 LAB — CBC WITH DIFFERENTIAL/PLATELET
Abs Immature Granulocytes: 0.05 10*3/uL (ref 0.00–0.07)
Basophils Absolute: 0 10*3/uL (ref 0.0–0.1)
Basophils Relative: 0 %
Eosinophils Absolute: 0 10*3/uL (ref 0.0–0.5)
Eosinophils Relative: 0 %
HCT: 42.6 % (ref 39.0–52.0)
Hemoglobin: 13.6 g/dL (ref 13.0–17.0)
Immature Granulocytes: 1 %
Lymphocytes Relative: 11 %
Lymphs Abs: 1.1 10*3/uL (ref 0.7–4.0)
MCH: 29.4 pg (ref 26.0–34.0)
MCHC: 31.9 g/dL (ref 30.0–36.0)
MCV: 92.2 fL (ref 80.0–100.0)
Monocytes Absolute: 0.1 10*3/uL (ref 0.1–1.0)
Monocytes Relative: 1 %
Neutro Abs: 8.6 10*3/uL — ABNORMAL HIGH (ref 1.7–7.7)
Neutrophils Relative %: 87 %
Platelets: 232 10*3/uL (ref 150–400)
RBC: 4.62 MIL/uL (ref 4.22–5.81)
RDW: 14.2 % (ref 11.5–15.5)
WBC: 9.8 10*3/uL (ref 4.0–10.5)
nRBC: 0 % (ref 0.0–0.2)

## 2022-08-19 LAB — URINALYSIS, ROUTINE W REFLEX MICROSCOPIC
Bilirubin Urine: NEGATIVE
Glucose, UA: NEGATIVE mg/dL
Ketones, ur: NEGATIVE mg/dL
Nitrite: NEGATIVE
Protein, ur: NEGATIVE mg/dL
RBC / HPF: >50 RBC/hpf — ABNORMAL HIGH (ref 0–5)
Specific Gravity, Urine: 1.014 (ref 1.005–1.030)
pH: 5 (ref 5.0–8.0)

## 2022-08-19 LAB — LACTIC ACID, PLASMA
Lactic Acid, Venous: 4.9 mmol/L (ref 0.5–1.9)
Lactic Acid, Venous: 3.2 mmol/L (ref 0.5–1.9)
Lactic Acid, Venous: 1.9 mmol/L (ref 0.5–1.9)

## 2022-08-19 LAB — PROTIME-INR
INR: 1.2 (ref 0.8–1.2)
Prothrombin Time: 15.3 seconds — ABNORMAL HIGH (ref 11.4–15.2)

## 2022-08-19 LAB — RESP PANEL BY RT-PCR (FLU A&B, COVID) ARPGX2
Influenza A by PCR: NEGATIVE
Influenza B by PCR: NEGATIVE
SARS Coronavirus 2 by RT PCR: NEGATIVE

## 2022-08-19 LAB — MAGNESIUM: Magnesium: 1.5 mg/dL — ABNORMAL LOW (ref 1.7–2.4)

## 2022-08-19 MED ORDER — VANCOMYCIN HCL IN DEXTROSE 1-5 GM/200ML-% IV SOLN: 1000.0000 mg | Freq: Once | INTRAVENOUS | Status: DC

## 2022-08-19 MED ORDER — ONDANSETRON HCL 4 MG/2ML IJ SOLN
4.0000 mg | Freq: Four times a day (QID) | INTRAMUSCULAR | Status: DC | PRN
  Administered 2022-08-19: 4 mg via INTRAVENOUS
  Filled 2022-08-19: qty 2

## 2022-08-19 MED ORDER — MAGNESIUM SULFATE 2 GM/50ML IV SOLN
2.0000 g | Freq: Once | INTRAVENOUS | Status: AC
  Administered 2022-08-19: 2 g via INTRAVENOUS
  Filled 2022-08-19: qty 50

## 2022-08-19 MED ORDER — SODIUM CHLORIDE 0.9 % IV SOLN: 2.0000 g | Freq: Two times a day (BID) | INTRAVENOUS | Status: DC

## 2022-08-19 MED ORDER — AMIODARONE HCL 200 MG PO TABS: 200.0000 mg | ORAL_TABLET | Freq: Every day | ORAL | Status: DC

## 2022-08-19 MED ORDER — GABAPENTIN 400 MG PO CAPS
400.0000 mg | ORAL_CAPSULE | Freq: Three times a day (TID) | ORAL | Status: DC
  Administered 2022-08-19: 400 mg via ORAL
  Filled 2022-08-19: qty 1

## 2022-08-19 MED ORDER — CEFEPIME HCL 2 G IV SOLR
2.0000 g | Freq: Once | INTRAVENOUS | Status: AC
  Administered 2022-08-19: 2 g via INTRAVENOUS
  Filled 2022-08-19: qty 12.5

## 2022-08-19 MED ORDER — SODIUM CHLORIDE 0.9 % IV BOLUS
500.0000 mL | Freq: Once | INTRAVENOUS | Status: AC
  Administered 2022-08-20: 500 mL via INTRAVENOUS

## 2022-08-19 MED ORDER — SODIUM CHLORIDE 0.9 % IV SOLN
2.0000 g | INTRAVENOUS | Status: DC
  Administered 2022-08-19: 2 g via INTRAVENOUS
  Filled 2022-08-19: qty 20

## 2022-08-19 MED ORDER — PANTOPRAZOLE SODIUM 40 MG PO TBEC
40.0000 mg | DELAYED_RELEASE_TABLET | Freq: Two times a day (BID) | ORAL | Status: DC
  Administered 2022-08-19 – 2022-08-24 (×10): 40 mg via ORAL
  Filled 2022-08-19 (×10): qty 1

## 2022-08-19 MED ORDER — LACTATED RINGERS IV BOLUS (SEPSIS)
250.0000 mL | Freq: Once | INTRAVENOUS | Status: AC
  Administered 2022-08-19: 250 mL via INTRAVENOUS

## 2022-08-19 MED ORDER — ACETAMINOPHEN 325 MG PO TABS
650.0000 mg | ORAL_TABLET | Freq: Once | ORAL | Status: DC
  Filled 2022-08-19: qty 2

## 2022-08-19 MED ORDER — ALPRAZOLAM 0.5 MG PO TABS
1.0000 mg | ORAL_TABLET | Freq: Every day | ORAL | Status: DC
  Administered 2022-08-19 – 2022-08-23 (×5): 1 mg via ORAL
  Filled 2022-08-19: qty 2
  Filled 2022-08-19: qty 1
  Filled 2022-08-19 (×3): qty 2

## 2022-08-19 MED ORDER — ACETAMINOPHEN 650 MG RE SUPP: 650.0000 mg | Freq: Four times a day (QID) | RECTAL | Status: DC | PRN

## 2022-08-19 MED ORDER — POLYETHYLENE GLYCOL 3350 17 G PO PACK
17.0000 g | PACK | Freq: Two times a day (BID) | ORAL | Status: DC
  Administered 2022-08-19 – 2022-08-20 (×2): 17 g via ORAL
  Filled 2022-08-19 (×7): qty 1

## 2022-08-19 MED ORDER — TAMSULOSIN HCL 0.4 MG PO CAPS
0.4000 mg | ORAL_CAPSULE | Freq: Every day | ORAL | Status: DC
  Administered 2022-08-19 – 2022-08-23 (×5): 0.4 mg via ORAL
  Filled 2022-08-19 (×5): qty 1

## 2022-08-19 MED ORDER — LACTATED RINGERS IV SOLN: INTRAVENOUS | Status: DC

## 2022-08-19 MED ORDER — IBUPROFEN 400 MG PO TABS
600.0000 mg | ORAL_TABLET | Freq: Once | ORAL | Status: AC
  Administered 2022-08-19: 600 mg via ORAL
  Filled 2022-08-19: qty 2

## 2022-08-19 MED ORDER — LACTATED RINGERS IV BOLUS (SEPSIS)
1000.0000 mL | Freq: Once | INTRAVENOUS | Status: AC
  Administered 2022-08-19: 1000 mL via INTRAVENOUS

## 2022-08-19 MED ORDER — FINASTERIDE 5 MG PO TABS
5.0000 mg | ORAL_TABLET | Freq: Every day | ORAL | Status: DC
  Administered 2022-08-20 – 2022-08-24 (×5): 5 mg via ORAL
  Filled 2022-08-19 (×5): qty 1

## 2022-08-19 MED ORDER — VANCOMYCIN HCL 1250 MG/250ML IV SOLN
1250.0000 mg | INTRAVENOUS | Status: DC
Start: 2022-08-20 — End: 2022-08-19

## 2022-08-19 MED ORDER — IOHEXOL 350 MG/ML SOLN
100.0000 mL | Freq: Once | INTRAVENOUS | Status: AC | PRN
  Administered 2022-08-19: 100 mL via INTRAVENOUS

## 2022-08-19 MED ORDER — LUBIPROSTONE 24 MCG PO CAPS
24.0000 ug | ORAL_CAPSULE | Freq: Two times a day (BID) | ORAL | Status: DC
  Administered 2022-08-19 – 2022-08-24 (×10): 24 ug via ORAL
  Filled 2022-08-19 (×10): qty 1

## 2022-08-19 MED ORDER — PROMETHAZINE HCL 12.5 MG PO TABS: 12.5000 mg | ORAL_TABLET | Freq: Four times a day (QID) | ORAL | Status: DC | PRN

## 2022-08-19 MED ORDER — ATORVASTATIN CALCIUM 40 MG PO TABS
80.0000 mg | ORAL_TABLET | Freq: Every day | ORAL | Status: DC
  Administered 2022-08-20 – 2022-08-24 (×5): 80 mg via ORAL
  Filled 2022-08-19 (×5): qty 2

## 2022-08-19 MED ORDER — ACETAMINOPHEN 325 MG PO TABS: 650.0000 mg | ORAL_TABLET | Freq: Four times a day (QID) | ORAL | Status: DC | PRN

## 2022-08-19 MED ORDER — METRONIDAZOLE 500 MG/100ML IV SOLN
500.0000 mg | Freq: Once | INTRAVENOUS | Status: AC
Start: 2022-08-19 — End: 2022-08-19
  Administered 2022-08-19: 500 mg via INTRAVENOUS
  Filled 2022-08-19: qty 100

## 2022-08-19 MED ORDER — VANCOMYCIN HCL 1500 MG/300ML IV SOLN
1500.0000 mg | Freq: Once | INTRAVENOUS | Status: AC
  Administered 2022-08-19: 1500 mg via INTRAVENOUS
  Filled 2022-08-19: qty 300

## 2022-08-19 NOTE — ED Notes (Signed)
EDP at [facility]. Family at [facility]. Pt alert, NAD, calm, interactive. VSS.

## 2022-08-19 NOTE — Assessment & Plan Note (Addendum)
CT abdomen and pelvis with contrast - elongated urinary bladder which extends to the level of the umbilicus, can be seen with urinary retention. - Urology consultation appreciated -Inserted [place] foley catheter -Resume silodosin -d/c home with foley>>discussed with Dr. [name] who will see him in office next week (08/29/22)

## 2022-08-19 NOTE — Progress Notes (Signed)
Attempted to insert a 24 F and 20 F 3 way catheter for possible bladder irrigation and it was meeting resistance. Attempted a 31 F foley and it wouldn't go in without meeting resistance. Notified MD to try a coude foley. She stated she would put in order and if it unsuccessful to let her know and she will contact urology. Will let oncoming nurse know.

## 2022-08-19 NOTE — Assessment & Plan Note (Addendum)
Febrile to 103.   With significant lactic acidosis 4.9 > 3.2.  Likely urinary source of enterococcus infection -continue supportive measures -Follow-up blood and urine cultures -lactic acid peaked 4.9 -sepsis physiology resolved with IVF and IV abx

## 2022-08-19 NOTE — Assessment & Plan Note (Addendum)
UA suggestive with leukocytes, 21-50 WBCs.  Patient has been self cathing every other day.  He is status post UroLift procedure 8/16 by Dr. [name].  He had done well after this procedure until 2 days ago when he had difficulty and pain during self cath, and today gross hematuria with clots. -CT abdomen and pelvis-showing distended urinary bladder suggesting urinary retention, distended gallbladder with stones -IV ceftriaxone 2 g initially started>>Unasyn>>amoxil -Follow-up urine cultures--E. faecalis

## 2022-08-19 NOTE — Assessment & Plan Note (Addendum)
Mag- 1.5. QTc at 450. He is on amiodarone but being held initially - Replete

## 2022-08-19 NOTE — ED Triage Notes (Signed)
Pt with emesis started today, recent surgery on penis-has bleeding today after self cath. Fever 103 in triage, wife denies fever at home. Surgery a month ago

## 2022-08-19 NOTE — ED Notes (Signed)
Dr. Doren Custard notified of possible code sepsis

## 2022-08-19 NOTE — Assessment & Plan Note (Signed)
Heart rate ranging from 56-105.  On Eliquis, and he believes he is on amiodarone. -Hold Eliquis -Resume amiodarone

## 2022-08-19 NOTE — Progress Notes (Signed)
Pharmacy Antibiotic Note  Christopher [name] is a 76 y.o. male admitted on 08/19/2022 with  unknown source of infection .  Pharmacy has been consulted for vancomycin and cefepime dosing.  Plan: Vancomycin 1500 mg IV x 1 dose. Vancomycin 1250 mg IV every 24 hours Cefepime 2000 mg IV every 12 hours. Monitor labs, c/s, and vanco level as indicated.  Height: '5\' 8"'$  (172.7 cm) Weight: 69.9 kg (154 lb) IBW/kg (Calculated) : 68.4  Temp (24hrs), Avg:103 F (39.4 C), Min:103 F (39.4 C), Max:103 F (39.4 C)  Recent Labs  Lab 08/19/22 1202 08/19/22 1208  WBC  --  9.8  CREATININE  --  1.07  LATICACIDVEN 4.9*  --     Estimated Creatinine Clearance: 55.9 mL/min (by C-G formula based on SCr of 1.07 mg/dL).    Allergies  Allergen Reactions   Tylenol [Acetaminophen]     Stomach pain    Morphine And Related Nausea And Vomiting   Propofol Nausea And Vomiting    Antimicrobials this admission: Vanco 9/3 >> Cefepime 9/3 >> Flagyl 9/3  Microbiology results: 9/3 BCx: pending 9/3 UCx: pending    Thank you for allowing pharmacy to be a part of this patient's care.  Margot Ables, PharmD Clinical Pharmacist 08/19/2022 1:04 PM

## 2022-08-19 NOTE — H&P (Addendum)
History and Physical    Christopher [name] WIO:[id number] DOB: October 20, 1946 DOA: 08/19/2022  PCP: [facility], [facility]   Patient coming from: Home  I have personally briefly reviewed patient's old medical records in [place]  Chief Complaint: Blood in urine  HPI: Christopher [name] is a 76 y.o. male with medical history significant for gastric ulcer, coronary artery disease, BPH, atrial fibrillation on chronic anticoagulation. Patient presented to the ED with complaints of bleeding from his urethra today after self cath.  Patient had a UroLift procedure- 08/01/22 by Dr. [name].  Since then he has been having to self cath every other day, on the days he does not self cath, he reports normal urine.  Reports 2 days ago, he was having difficulty inserting the Foley catheter and he had pain but he was able to pass a true.  Yesterday he had he believes normal amount of urine, without blood.  Today he had a little bit of pain again, when he was self cathing, but subsequently started seeing blood, with clots.  He reports he tried milking his penis, get out the blood, and suddenly a jet of blood came out from his penis onto the wall with blood clots.  He reports some pain in his lower abdomen prior to this, but after the blood came out, he had relief. Reports 2 episodes of vomiting today, he otherwise denies difficulty breathing, no cough, no diarrhea.,  No pain or redness of his extremities. He is on Eliquis and has been compliant, his last dose of Eliquis was this morning  ED Course: Febrile to 103.  Heart rate 57-105.  Respirate rate 15-31.  Blood pressure systolic 38-453.  Lactic acidosis 4.9 > 3.2.  WBC 9.8.  UA with small leukocytes blood, 21-50 WBCs. 2.25 L sepsis fluid bolus given, maintenance of LR at 150.  Portable chest x-ray unremarkable.  Renal ultrasound could not be obtained so CT was ordered  CT abdomen and pelvis with contrast-distended gallbladder with gallstones, RUQ  ultrasound recommended.  Moderate volume of stool, elongated urinary bladder which extends to the level of the umbilicus, seen with urinary retention, no bladder wall thickening.  Cefepime vancomycin and metronidazole started.   Review of Systems: As per HPI all other systems reviewed and negative.  Past Medical History:  Diagnosis Date   A-fib [facility])    Arthritis    "all over my body" (06/17/2017)   BPH (benign prostatic hyperplasia)    takes Proscar daily   CAD (coronary artery disease)    a. s/p CABG in 09/2019 with SVG-PDA-OM and LIMA-LAD with LAA excision   Chronic back pain    DDD; "upper and lower" (06/17/2017)   Constipation    takes Colace daily   Coronary artery disease    DDD (degenerative disc disease), cervical    DVT (deep venous thrombosis) (HCC)    left peroneal vein 07/2017 (in setting of recent left TKA)   Dysrhythmia    history of Atrial fibrillation, no long has this   Enlarged prostate    GERD (gastroesophageal reflux disease)    takes Nexium daily   H/O hiatal hernia    History of colon polyps    benign   History of gastric ulcer    Insomnia    takes Xanax nightly   Joint capsule tear    left knee   Joint pain    Joint swelling    Macular degeneration    Muscle spasm of back  takes Valium daily as needed   PAF (paroxysmal atrial fibrillation) (HCC)    Peripheral neuropathy    takes Gabapentin daily   PONV (postoperative nausea and vomiting)    Pre-diabetes    Sleep apnea    pt does not wear a cpap   Urinary hesitancy    Weakness    numbness and tingling    Past Surgical History:  Procedure Laterality Date   ANTERIOR CERVICAL DECOMP/DISCECTOMY FUSION  ~ 1991   "took bone from hip"   ANTERIOR CERVICAL DECOMP/DISCECTOMY FUSION  X 2   "put plate in"   ANTERIOR CERVICAL DISCECTOMY  <11/2005    5-6 and 4-5./notes 05/02/2011   APPENDECTOMY     BACK SURGERY     BIOPSY  05/30/2018   Procedure: BIOPSY;  Surgeon: [name], MD;  [facility];  Service: Endoscopy;;  esophagus   BIOPSY  01/09/2022   Procedure: BIOPSY;  Surgeon: Harvel Quale, MD;  [facility];  Service: Gastroenterology;;   CARDIAC CATHETERIZATION     3-4 per pt   CARPAL TUNNEL RELEASE Bilateral 10/2005 - 11/2005   right-left /notes 05/02/2011   CATARACT EXTRACTION W/PHACO Right 05/24/2014   Procedure: CATARACT EXTRACTION PHACO AND INTRAOCULAR LENS PLACEMENT ([place]);  Surgeon: [name], MD;  Location: [facility];  Service: Ophthalmology;  Laterality: Right;  CDE 6.74   CATARACT EXTRACTION W/PHACO Left 06/17/2014   Procedure: CATARACT EXTRACTION PHACO AND INTRAOCULAR LENS PLACEMENT ([facility]);  Surgeon: [name], MD;  Location: [facility];  Service: Ophthalmology;  Laterality: Left;  CDE:4.65   COLONOSCOPY WITH ESOPHAGOGASTRODUODENOSCOPY (EGD) N/A 01/22/2013   Procedure: COLONOSCOPY WITH ESOPHAGOGASTRODUODENOSCOPY (EGD);  Surgeon: [name], MD;  [facility];  Service: Endoscopy;  Laterality: N/A;  140   COLONOSCOPY WITH PROPOFOL N/A 05/30/2018   diverticulosis and external hemorrhoids   CORONARY ARTERY BYPASS GRAFT  2020   CYSTOSCOPY WITH INSERTION OF UROLIFT N/A 04/16/2022   Procedure: CYSTOSCOPY WITH INSERTION OF UROLIFT;  Surgeon: Cleon Gustin, MD;  [facility]: [facility];  Service: Urology;  Laterality: N/A;   ESOPHAGOGASTRODUODENOSCOPY (EGD) WITH PROPOFOL N/A 05/30/2018   normal esophagus, presence of possible barretts (bx consistent with reflux changes, GAVE)   ESOPHAGOGASTRODUODENOSCOPY (EGD) WITH PROPOFOL N/A 01/09/2022   Procedure: ESOPHAGOGASTRODUODENOSCOPY (EGD) WITH PROPOFOL;  Surgeon: Harvel Quale, MD;  [facility];  Service: Gastroenterology;  Laterality: N/A;  305, moved up per Leigh Amm   EYE SURGERY Bilateral    "cleaned film w/laser; since my 1st cataract OR"   JOINT REPLACEMENT     LEFT HEART CATH AND CORONARY ANGIOGRAPHY N/A 02/16/2019   Procedure: LEFT HEART CATH AND CORONARY  ANGIOGRAPHY;  Surgeon: Belva Crome, MD;  Location: [place] CV [facility];  Service: Cardiovascular;  Laterality: N/A;   POSTERIOR LUMBAR FUSION     "put a plate in"   QUADRICEPS TENDON REPAIR Left 07/01/2017   QUADRICEPS TENDON REPAIR Left 07/01/2017   Procedure: REPAIR QUADRICEP TENDON;  Surgeon: [name], MD;  Location: [place];  Service: Orthopedics;  Laterality: Left;   SHOULDER ARTHROSCOPY Bilateral    SPINAL CORD STIMULATOR INSERTION N/A 09/23/2015   Procedure: LUMBAR SPINAL CORD STIMULATOR INSERTION;  Surgeon: [name], MD;  Location: [place] [facility];  Service: Neurosurgery;  Laterality: N/A;  LUMBAR SPINAL CORD STIMULATOR INSERTION   SPINAL CORD STIMULATOR INSERTION N/A 04/25/2022   Procedure: REVISION OF  SPINAL CORD STIMULATOR;  Surgeon: Newman Pies, MD;  Location: [place];  Service:  Neurosurgery;  Laterality: N/A;   TOTAL KNEE ARTHROPLASTY Left 06/17/2017   TOTAL KNEE ARTHROPLASTY Left 06/17/2017   Procedure: TOTAL KNEE ARTHROPLASTY;  Surgeon: [name], MD;  Location: [place];  Service: Orthopedics;  Laterality: Left;   TOTAL KNEE ARTHROPLASTY Right 12/16/2017   Procedure: TOTAL KNEE ARTHROPLASTY;  Surgeon: [name], MD;  Location: [place];  Service: Orthopedics;  Laterality: Right;     reports that he quit smoking about 35 years ago. His smoking use included cigarettes. He has a 30.00 pack-year smoking history. He has quit using smokeless tobacco.  His smokeless tobacco use included chew. He reports current alcohol use. He reports that he does not use drugs.  Allergies  Allergen Reactions   Tylenol [Acetaminophen] Other (See Comments)    Stomach pain    Morphine And Related Nausea And Vomiting   Propofol Nausea And Vomiting    History reviewed. No pertinent family history.  Prior to Admission medications   Medication Sig Start Date End Date Taking? Authorizing Provider  ALPRAZolam Duanne Moron) 1 MG tablet Take 1 mg by mouth at bedtime. 09/27/17  Yes [provider]  amiodarone (PACERONE) 200 MG tablet TAKE 1 TABLET BY MOUTH EVERY DAY 04/10/22  Yes Evans Lance, MD  apixaban (ELIQUIS) 5 MG TABS tablet Take 1 tablet (5 mg total) by mouth 2 (two) times daily. *Restart 04/28/2022* 04/25/22  Yes Viona Gilmore D, NP  aspirin EC 81 MG tablet Take 1 tablet (81 mg total) by mouth daily. 02/11/19  Yes Weaver, Scott T, PA-C  atorvastatin (LIPITOR) 80 MG tablet Take 1 tablet (80 mg total) by mouth daily. 04/04/22  Yes Strader, [place] M, PA-C  Calcium Polycarbophil (FIBER-CAPS PO) Take 2 capsules by mouth daily.   Yes [provider]  Cholecalciferol (VITAMIN D3) 10 MCG (400 UNIT) CAPS Take 400 Units by mouth daily.   Yes [provider]  Cyanocobalamin (B-12 PO) Take 1 tablet by mouth daily.   Yes [provider]  docusate sodium (COLACE) 250 MG capsule Take 250 mg by mouth daily.   Yes [provider]  finasteride (PROSCAR) 5 MG tablet TAKE 1 TABLET (5 MG TOTAL) BY MOUTH DAILY. 08/09/22  Yes McKenzie, Candee Furbish, MD  furosemide (LASIX) 20 MG tablet Take 1 tablet (20 mg total) by mouth daily. 01/02/21  Yes Belva Crome, MD  gabapentin (NEURONTIN) 400 MG capsule One three times a day Patient taking differently: Take 400 mg by mouth 3 (three) times daily. 01/05/19  Yes Tanda Rockers, MD  lubiprostone (AMITIZA) 24 MCG capsule Take 1 capsule (24 mcg total) by mouth 2 (two) times daily with a meal. 05/16/22  Yes Montez Morita, Quillian Quince, MD  nitroGLYCERIN (NITROSTAT) 0.4 MG SL tablet Place 1 tablet (0.4 mg total) under the tongue every 5 (five) minutes as needed for chest pain. 08/26/19  Yes Belva Crome, MD  Omega-3 1000 MG CAPS Take 1,000 mg by mouth daily.   Yes [provider]  pantoprazole (PROTONIX) 40 MG tablet TAKE 1 TABLET BY MOUTH TWICE A DAY (30 TO 60 MINUTES BEFORE 1ST AND LAST MEALS OF THE DAY) Patient taking differently: 40 mg 2 (two) times daily. (30 TO 60 MINUTES BEFORE 1ST AND LAST MEALS OF THE DAY)  10/20/19  Yes Tanda Rockers, MD  potassium chloride (KLOR-CON) 10 MEQ tablet Take 10 mEq by mouth daily. 09/29/19  Yes [provider]  Propylene Glycol (SYSTANE BALANCE) 0.6 % SOLN Place 1 drop into both eyes daily  as needed (dry eyes).   Yes [provider]  silodosin (RAPAFLO) 8 MG CAPS capsule Take 1 capsule (8 mg total) by mouth at bedtime. 06/20/22  Yes McKenzie, Candee Furbish, MD  Simethicone 180 MG CAPS Take 1 capsule (180 mg total) by mouth 3 (three) times daily as needed. Patient taking differently: Take 180 mg by mouth 3 (three) times daily as needed (bloating). 08/18/19  Yes Rehman, Mechele Dawley, MD  traMADol (ULTRAM) 50 MG tablet Take 1-2 tablets (50-100 mg total) by mouth every 4 (four) hours as needed. 04/25/22  Yes Viona Gilmore D, NP  vitamin E 200 UNIT capsule Take 200 Units by mouth daily.   Yes [provider]  Zinc Sulfate (ZINC 15 PO) Take 15 mg by mouth daily.   Yes [provider]  nitrofurantoin, macrocrystal-monohydrate, (MACROBID) 100 MG capsule Take 1 capsule (100 mg total) by mouth every 12 (twelve) hours. Patient not taking: Reported on 08/19/2022 06/29/22   Summerlin, Berneice Heinrich, PA-C  sulfamethoxazole-trimethoprim (BACTRIM DS) 800-160 MG tablet Take 1 tablet by mouth every 12 (twelve) hours. 06/27/22   Cleon Gustin, MD    Physical Exam: Vitals:   08/19/22 1545 08/19/22 1600 08/19/22 1630 08/19/22 1700  BP:      Pulse: 94 (!) 104 83 83  Resp:    (!) 21  Temp:      TempSrc:      SpO2:    95%  Weight:      Height:        Constitutional: NAD, calm, comfortable Vitals:   08/19/22 1545 08/19/22 1600 08/19/22 1630 08/19/22 1700  BP:      Pulse: 94 (!) 104 83 83  Resp:    (!) 21  Temp:      TempSrc:      SpO2:    95%  Weight:      Height:       Eyes: PERRL, lids and conjunctivae normal ENMT: Mucous membranes are moist.   Neck: normal, supple, no masses, no thyromegaly Respiratory: clear to auscultation bilaterally,  no wheezing, no crackles.  Cardiovascular: Regular rate and rhythm, no murmurs / rubs / gallops. No extremity edema.  Lower extremities warm. Abdomen: no tenderness, no masses palpated. No hepatosplenomegaly. Bowel sounds positive.  Musculoskeletal: no clubbing / cyanosis. No joint deformity upper and lower extremities.  Skin: no rashes, lesions, ulcers. No induration Neurologic: No apparent cranial violence moving extremities spontaneously.  Psychiatric: Normal judgment and insight. Alert and oriented x 3. Normal mood.   Labs on Admission: I have personally reviewed following labs and imaging studies  CBC: Recent Labs  Lab 08/19/22 1208  WBC 9.8  NEUTROABS 8.6*  HGB 13.6  HCT 42.6  MCV 92.2  PLT 974   Basic Metabolic Panel: Recent Labs  Lab 08/19/22 1208 08/19/22 1212  NA 140  --   K 3.9  --   CL 107  --   CO2 23  --   GLUCOSE 100*  --   BUN 12  --   CREATININE 1.07  --   CALCIUM 9.0  --   MG  --  1.5*   GFR: Estimated Creatinine Clearance: 55.9 mL/min (by C-G formula based on SCr of 1.07 mg/dL). Liver Function Tests: Recent Labs  Lab 08/19/22 1208  AST 65*  ALT 69*  ALKPHOS 126  BILITOT 1.2  PROT 6.9  ALBUMIN 3.7   Coagulation Profile: Recent Labs  Lab 08/19/22 1208  INR 1.2   Urine analysis:  Component Value Date/Time   COLORURINE YELLOW 08/19/2022 1356   APPEARANCEUR HAZY (A) 08/19/2022 1356   APPEARANCEUR Cloudy (A) 08/01/2022 1133   LABSPEC 1.014 08/19/2022 1356   PHURINE 5.0 08/19/2022 1356   GLUCOSEU NEGATIVE 08/19/2022 1356   HGBUR LARGE (A) 08/19/2022 1356   BILIRUBINUR NEGATIVE 08/19/2022 1356   BILIRUBINUR Negative 08/01/2022 1133   KETONESUR NEGATIVE 08/19/2022 1356   PROTEINUR NEGATIVE 08/19/2022 1356   UROBILINOGEN 0.2 03/07/2014 2103   NITRITE NEGATIVE 08/19/2022 1356   LEUKOCYTESUR SMALL (A) 08/19/2022 1356    Radiological Exams on Admission: CT ABDOMEN PELVIS W CONTRAST  Result Date: 08/19/2022 CLINICAL DATA:  Sepsis.  Vomiting. [age] year. Patient reports surgery 1 month ago on penis. EXAM: CT ABDOMEN AND PELVIS WITH CONTRAST TECHNIQUE: Multidetector CT imaging of the abdomen and pelvis was performed using the standard protocol following bolus administration of intravenous contrast. RADIATION DOSE REDUCTION: This exam was performed according to the departmental dose-optimization program which includes automated exposure control, adjustment of the mA and/or kV according to patient size and/or use of iterative reconstruction technique. CONTRAST:  150m OMNIPAQUE IOHEXOL 350 MG/ML SOLN COMPARISON:  Abdominopelvic CT 02/06/2022 FINDINGS: Lower chest: Dependent atelectasis with trace bilateral pleural thickening, no significant effusion. Cardiomegaly with coronary artery calcifications. Prior median sternotomy. Hepatobiliary: There is no focal hepatic lesion. Gallbladder is distended. Patient motion artifact through the gallbladder limits assessment for pericholecystic inflammation. There is no definite wall thickening. 15 mm rounded high-density in the gallbladder likely represent gallstones, with compared with 06/26/2022 abdominal ultrasound. There is diffuse periportal edema. No biliary dilatation. Pancreas: No ductal dilatation or inflammation. Spleen: Tiny subcentimeter hypodensity in the lower spleen, nonspecific. Spleen is normal in size. Adrenals/Urinary Tract: Normal adrenal glands. Mild prominence of the right renal collecting system without frank hydronephrosis. Mild thinning of bilateral renal parenchyma calcification in the right renal hilum is felt to be vascular rather than nonobstructing stone. Elongated urinary bladder which extends to the level of the umbilicus. No bladder wall thickening. Stomach/Bowel: No bowel obstruction or inflammatory change. Moderate volume of colonic stool. There is stool distending the rectum. No rectal or colonic wall thickening. Stomach is decompressed. Appendix not visualized.  Vascular/Lymphatic: Aortic and branch atherosclerosis. No aortic aneurysm. Patent portal vein. Prominent left inguinal node measures 10 mm short axis. This is slightly more prominent than on prior exam. Reproductive: Surgical clips in the prostate which is prominent size. No evidence of prostatic fluid collection. The included portion of the penis and urethra are unremarkable. Other: No ascites. No free air. Tiny fat containing umbilical hernia. Minimal fat in the left inguinal canal. Musculoskeletal: Postsurgical change in the lumbar spine. Spinal stimulator in place. No focal bone lesions. IMPRESSION: 1. Distended gallbladder with gallstones. Patient motion artifact through the gallbladder limits assessment for pericholecystic inflammation. Consider right upper quadrant ultrasound for further evaluation as clinically indicated. 2. Moderate volume of colonic stool with stool distending the rectum, suggesting constipation. 3. Elongated urinary bladder which extends to the level of the umbilicus, can be seen with urinary retention. No bladder wall thickening. 4. Prominent left inguinal node is slightly more prominent than on prior exam, likely reactive. 5. Additional chronic findings as described. Aortic Atherosclerosis (ICD10-I70.0). Electronically Signed   By: MKeith RakeM.D.   On: 08/19/2022 16:44   DG Chest Port 1 View  Result Date: 08/19/2022 CLINICAL DATA:  Fever.  Recent surgery. EXAM: PORTABLE CHEST 1 VIEW COMPARISON:  01/01/2021. FINDINGS: Stable changes from prior cardiac surgery. No mediastinal or hilar masses.  Clear lungs.  No pleural effusion or pneumothorax. Skeletal structures are grossly intact. IMPRESSION: No active disease. Electronically Signed   By: Lajean Manes M.D.   On: 08/19/2022 12:30    EKG: Independently reviewed.  Artifacts present likely from spinal stimulator.  EKG showing sinus rhythm rate 59, QTc prolonged at 509.  No Significant change from  prior.  Assessment/Plan Principal Problem:   Severe sepsis (HCC) Active Problems:   Coronary artery disease involving native coronary artery of native heart with angina pectoris (HCC)   Atrial fibrillation, chronic (HCC)   Urinary tract infection   Bladder outlet obstruction  Assessment and Plan: * Severe sepsis (HCC) Febrile to 103.  Heart rate ranging from 56-105, blood pressure systolic now soft, ranging from 25-366 systolic.  With significant lactic acidosis 4.9 > 3.2.  Likely urinary source. -2.25 L bolus given, continue LR 100cc/hr x 20hrs -Follow-up blood and urine cultures -Trend lactic acid  Hypomagnesemia Mag- 1.5. QTc prolonged at 503. He is on amiodarone. - Replete  Bladder outlet obstruction CT abdomen and pelvis with contrast - elongated urinary bladder which extends to the level of the umbilicus, can be seen with urinary retention. - Urology consultation in the morning -Insert Foley catheter -Resume silodosin  Urinary tract infection UA suggestive with leukocytes, 21-50 WBCs.  Patient has been self cathing every other day.  He is status post UroLift procedure 8/16 by Dr. [name].  He had done well after this procedure until 2 days ago when he had difficulty and pain during self cath, and today gross hematuria with clots. -Recent urine cultures-pansensitive Enterococcus faecalis and Klebsiella oxytoca -CT abdomen and pelvis-showing distended urinary bladder suggesting urinary retention, distended gallbladder with stones -Mild liver enzyme elevation, AST 65, ALT 69, will obtain follow-up RUQ ultrasound -IV ceftriaxone 2 g daily -Follow-up urine cultures  Atrial fibrillation, chronic (HCC) Heart rate ranging from 56-105.  On Eliquis, and he believes he is on amiodarone. -Hold Eliquis -Resume amiodarone   DVT prophylaxis: SCDS Code Status: Full Code Family Communication: Son Hydrographic surveyor at bedside. Disposition Plan: ~/> 2 days Consults called:  Urology Admission status: Inpt tele  I certify that at the point of admission it is my clinical judgment that the patient will require inpatient hospital care spanning beyond 2 midnights from the point of admission due to high intensity of service, high risk for further deterioration and high frequency of surveillance required.   Author: Bethena Roys, MD 08/19/2022 5:46 PM  For on call review [email or web address].

## 2022-08-19 NOTE — ED Provider Notes (Signed)
[facility] EMERGENCY DEPARTMENT Provider Note   CSN: [id number] Arrival date & time: 08/19/22  1143     History  Chief Complaint  Patient presents with   Emesis   Fever    JAVOHN BASEY is a 76 y.o. male.   Emesis Associated symptoms: chills and fever   Patient presents for chills, rigors, nausea, vomiting, and hematuria.  Onset of symptoms was today.  He reportedly woke up in his normal state of health but developed symptoms later in the morning.  Patient's medical history includes arthritis, atrial fibrillation, PVD, BPH, GERD, CAD, prediabetes.  He underwent a recent UroLift procedure.  He has been doing self cath at home.  This morning, he had gross hematuria on self cath.  He currently denies any nausea.  He endorses pain in his back which she describes as chronic.  Temperature was checked at home but found to be normal.  Patient is febrile on arrival in the ED.     Home Medications Prior to Admission medications   Medication Sig Start Date End Date Taking? Authorizing Provider  ALPRAZolam Duanne Moron) 1 MG tablet Take 1 mg by mouth at bedtime. 09/27/17   [provider]  amiodarone (PACERONE) 200 MG tablet TAKE 1 TABLET BY MOUTH EVERY DAY 04/10/22   Evans Lance, MD  apixaban (ELIQUIS) 5 MG TABS tablet Take 1 tablet (5 mg total) by mouth 2 (two) times daily. *Restart 04/28/2022* 04/25/22   Viona Gilmore D, NP  aspirin EC 81 MG tablet Take 1 tablet (81 mg total) by mouth daily. 02/11/19   Richardson Dopp T, PA-C  atorvastatin (LIPITOR) 80 MG tablet Take 1 tablet (80 mg total) by mouth daily. 04/04/22   Strader, Fransisco Hertz, PA-C  Calcium Polycarbophil (FIBER-CAPS PO) Take 2 capsules by mouth daily.    [provider]  Cholecalciferol (VITAMIN D3) 10 MCG (400 UNIT) CAPS Take 400 Units by mouth daily.    [provider]  Cyanocobalamin (B-12 PO) Take 1 tablet by mouth daily.    [provider]  docusate sodium (COLACE) 250 MG capsule Take 250 mg by  mouth daily.    [provider]  finasteride (PROSCAR) 5 MG tablet TAKE 1 TABLET (5 MG TOTAL) BY MOUTH DAILY. 08/09/22   McKenzie, Candee Furbish, MD  furosemide (LASIX) 20 MG tablet Take 1 tablet (20 mg total) by mouth daily. 01/02/21   Belva Crome, MD  gabapentin (NEURONTIN) 400 MG capsule One three times a day Patient taking differently: Take 400 mg by mouth 3 (three) times daily. 01/05/19   Tanda Rockers, MD  lubiprostone (AMITIZA) 24 MCG capsule Take 1 capsule (24 mcg total) by mouth 2 (two) times daily with a meal. 05/16/22   Montez Morita, Quillian Quince, MD  nitrofurantoin, macrocrystal-monohydrate, (MACROBID) 100 MG capsule Take 1 capsule (100 mg total) by mouth every 12 (twelve) hours. 06/29/22   Summerlin, Berneice Heinrich, PA-C  nitroGLYCERIN (NITROSTAT) 0.4 MG SL tablet Place 1 tablet (0.4 mg total) under the tongue every 5 (five) minutes as needed for chest pain. 08/26/19   Belva Crome, MD  Omega-3 1000 MG CAPS Take 1,000 mg by mouth daily.    [provider]  pantoprazole (PROTONIX) 40 MG tablet TAKE 1 TABLET BY MOUTH TWICE A DAY (30 TO 60 MINUTES BEFORE 1ST AND LAST MEALS OF THE DAY) Patient taking differently: 40 mg 2 (two) times daily. (30 TO 60 MINUTES BEFORE 1ST AND LAST MEALS OF THE DAY) 10/20/19   Wert,  Christena Deem, MD  potassium chloride (KLOR-CON) 10 MEQ tablet Take 10 mEq by mouth daily. 09/29/19   [provider]  Propylene Glycol (SYSTANE BALANCE) 0.6 % SOLN Place 1 drop into both eyes daily as needed (dry eyes).    [provider]  silodosin (RAPAFLO) 8 MG CAPS capsule Take 1 capsule (8 mg total) by mouth at bedtime. 06/20/22   McKenzie, Candee Furbish, MD  Simethicone 180 MG CAPS Take 1 capsule (180 mg total) by mouth 3 (three) times daily as needed. Patient taking differently: Take 180 mg by mouth 3 (three) times daily as needed (bloating). 08/18/19   Rehman, Mechele Dawley, MD  sulfamethoxazole-trimethoprim (BACTRIM DS) 800-160 MG tablet Take 1 tablet by mouth  every 12 (twelve) hours. 06/27/22   McKenzie, Candee Furbish, MD  traMADol (ULTRAM) 50 MG tablet Take 1-2 tablets (50-100 mg total) by mouth every 4 (four) hours as needed. 04/25/22   Viona Gilmore D, NP  vitamin E 200 UNIT capsule Take 200 Units by mouth daily.    [provider]  Zinc Sulfate (ZINC 15 PO) Take 15 mg by mouth daily.    [provider]      Allergies    Tylenol [acetaminophen], Morphine and related, and Propofol    Review of Systems   Review of Systems  Constitutional:  Positive for chills, fatigue and fever.  Gastrointestinal:  Positive for nausea and vomiting.  Genitourinary:  Positive for hematuria.  All other systems reviewed and are negative.   Physical Exam Updated Vital Signs BP (!) 107/45   Pulse (!) 57   Temp (!) 100.8 F (38.2 C) (Oral)   Resp (!) 21   Ht '5\' 8"'$  (1.727 m)   Wt 69.9 kg   SpO2 93%   BMI 23.42 kg/m  Physical Exam Vitals and nursing note reviewed.  Constitutional:      General: He is not in acute distress.    Appearance: He is well-developed and normal weight. He is ill-appearing. He is not toxic-appearing or diaphoretic.  HENT:     Head: Normocephalic and atraumatic.     Right Ear: External ear normal.     Left Ear: External ear normal.     Nose: Nose normal.     Mouth/Throat:     Mouth: Mucous membranes are moist.     Pharynx: Oropharynx is clear.  Eyes:     Extraocular Movements: Extraocular movements intact.     Conjunctiva/sclera: Conjunctivae normal.  Cardiovascular:     Rate and Rhythm: Regular rhythm. Tachycardia present.     Heart sounds: No murmur heard. Pulmonary:     Effort: Pulmonary effort is normal. Tachypnea present. No respiratory distress.     Breath sounds: No wheezing, rhonchi or rales.  Abdominal:     General: There is no distension.     Palpations: Abdomen is soft.     Tenderness: There is abdominal tenderness.  Musculoskeletal:        General: No swelling. Normal range of motion.      Cervical back: Normal range of motion and neck supple.     Right lower leg: No edema.     Left lower leg: No edema.  Skin:    General: Skin is warm and dry.     Coloration: Skin is not jaundiced or pale.  Neurological:     General: No focal deficit present.     Mental Status: He is alert and oriented to person, place, and time.  Cranial Nerves: No cranial nerve deficit.     Sensory: No sensory deficit.     Motor: No weakness.     Coordination: Coordination normal.  Psychiatric:        Mood and Affect: Mood normal.        Behavior: Behavior normal.        Thought Content: Thought content normal.        Judgment: Judgment normal.     ED Results / Procedures / Treatments   Labs (all labs ordered are listed, but only abnormal results are displayed) Labs Reviewed  COMPREHENSIVE METABOLIC PANEL - Abnormal; Notable for the following components:      Result Value   Glucose, Bld 100 (*)    AST 65 (*)    ALT 69 (*)    All other components within normal limits  LACTIC ACID, PLASMA - Abnormal; Notable for the following components:   Lactic Acid, Venous 4.9 (*)    All other components within normal limits  LACTIC ACID, PLASMA - Abnormal; Notable for the following components:   Lactic Acid, Venous 3.2 (*)    All other components within normal limits  CBC WITH DIFFERENTIAL/PLATELET - Abnormal; Notable for the following components:   Neutro Abs 8.6 (*)    All other components within normal limits  PROTIME-INR - Abnormal; Notable for the following components:   Prothrombin Time 15.3 (*)    All other components within normal limits  URINALYSIS, ROUTINE W REFLEX MICROSCOPIC - Abnormal; Notable for the following components:   APPearance HAZY (*)    Hgb urine dipstick LARGE (*)    Leukocytes,Ua SMALL (*)    RBC / HPF >50 (*)    Bacteria, UA RARE (*)    All other components within normal limits  MAGNESIUM - Abnormal; Notable for the following components:   Magnesium 1.5 (*)    All  other components within normal limits  RESP PANEL BY RT-PCR (FLU A&B, COVID) ARPGX2  CULTURE, BLOOD (ROUTINE X 2)  CULTURE, BLOOD (ROUTINE X 2)  URINE CULTURE    EKG EKG Interpretation  Date/Time:  Sunday August 19 2022 12:22:47 EDT Ventricular Rate:  59 PR Interval:    QRS Duration: 92 QT Interval:  503 QTC Calculation: 503 R Axis:   -72 Text Interpretation: Sinus rhythm Left axis deviation Low voltage, precordial leads RSR' in V1 or V2, right VCD or RVH Prolonged QT interval Artifact in lead(s) I II aVR aVL aVF Confirmed by Godfrey Pick [phone]) on 08/19/2022 12:40:50 PM  Radiology DG Chest Port 1 View  Result Date: 08/19/2022 CLINICAL DATA:  Fever.  Recent surgery. EXAM: PORTABLE CHEST 1 VIEW COMPARISON:  01/01/2021. FINDINGS: Stable changes from prior cardiac surgery. No mediastinal or hilar masses. Clear lungs.  No pleural effusion or pneumothorax. Skeletal structures are grossly intact. IMPRESSION: No active disease. Electronically Signed   By: Lajean Manes M.D.   On: 08/19/2022 12:30    Procedures Procedures    Medications Ordered in ED Medications  lactated ringers infusion ( Intravenous New Bag/Given 08/19/22 1259)  ondansetron (ZOFRAN) injection 4 mg (4 mg Intravenous Given 08/19/22 1232)  ceFEPIme (MAXIPIME) 2 g in sodium chloride 0.9 % 100 mL IVPB (has no administration in time range)  vancomycin (VANCOREADY) IVPB 1250 mg/250 mL (has no administration in time range)  lactated ringers bolus 1,000 mL (0 mLs Intravenous Stopped 08/19/22 1259)  lactated ringers bolus 1,000 mL (0 mLs Intravenous Stopped 08/19/22 1350)    And  lactated ringers bolus  250 mL (0 mLs Intravenous Stopped 08/19/22 1350)  ceFEPIme (MAXIPIME) 2 g in sodium chloride 0.9 % 100 mL IVPB (0 g Intravenous Stopped 08/19/22 1257)  metroNIDAZOLE (FLAGYL) IVPB 500 mg (0 mg Intravenous Stopped 08/19/22 1337)  vancomycin (VANCOREADY) IVPB 1500 mg/300 mL (0 mg Intravenous Stopped 08/19/22 1506)  magnesium sulfate IVPB 2 g 50  mL (0 g Intravenous Stopped 08/19/22 1350)  ibuprofen (ADVIL) tablet 600 mg (600 mg Oral Given 08/19/22 1334)  iohexol (OMNIPAQUE) 350 MG/ML injection 100 mL (100 mLs Intravenous Contrast Given 08/19/22 1617)    ED Course/ Medical Decision Making/ A&P                           Medical Decision Making Amount and/or Complexity of Data Reviewed Labs: ordered. Radiology: ordered. ECG/medicine tests: ordered.  Risk OTC drugs. Prescription drug management. Decision regarding hospitalization.   This patient presents to the ED for concern of chills, generalized weakness, nausea, vomiting, and hematuria, this involves an extensive number of treatment options, and is a complaint that carries with it a high risk of complications and morbidity.  The differential diagnosis includes sepsis, nephrolithiasis, UTI, cholecystitis, enteritis   Co morbidities that complicate the patient evaluation  arthritis, atrial fibrillation, PVD, BPH, GERD, CAD, prediabetes   Additional history obtained:  Additional history obtained from patient's wife External records from outside source obtained and reviewed including EMR   Lab Tests:  I Ordered, and personally interpreted labs.  The pertinent results include: Although no leukocytosis is present, patient does have a neutrophilia.  Lactic acid is elevated consistent with sepsis.  He has normal electrolytes and mild elevation in transaminases.  Urinalysis consistent with infection.   Imaging Studies ordered:  I ordered imaging studies including chest x-ray I independently visualized and interpreted imaging which showed no acute findings I agree with the radiologist interpretation   Cardiac Monitoring: / EKG:  The patient was maintained on a cardiac monitor.  I personally viewed and interpreted the cardiac monitored which showed an underlying rhythm of: Sinus rhythm  Problem List / ED Course / Critical interventions / Medication management  Patient is  76 year old male presenting for onset of chills, rigors, nausea, vomiting, and generalized weakness today.  On arrival in the ED, he is febrile, tachycardic, and tachypneic.  Presentation is consistent with sepsis.  On exam, he does have some generalized abdominal tenderness.  Patient was initiated on IV fluids and broad-spectrum antibiotics.  Diagnostic work-up was initiated.  Work-up included CT scans of chest, abdomen, and pelvis.  CT reported that they would not be able to perform these until patient was able to turn off his spinal stimulator.  Patient coordinated with family to obtain remote control from home.  Patient reports that he has been utilizing self cath at home.  He is able to void without catheter as well.  This morning, he had onset of hematuria.  Urinalysis does show evidence of infection.  I suspect urosepsis.  Patient remained hemodynamically stable while in the ED.  He was admitted to hospitalist for further management. I ordered medication including IV fluids and broad-spectrum antibiotics for sepsis; Zofran for nausea; Motrin for antipyresis Reevaluation of the patient after these medicines showed that the patient improved I have reviewed the patients home medicines and have made adjustments as needed   Social Determinants of Health:  Has access to outpatient care   CRITICAL CARE Performed by: Godfrey Pick   Total critical care time:  33 minutes  Critical care time was exclusive of separately billable procedures and treating other patients.  Critical care was necessary to treat or prevent imminent or life-threatening deterioration.  Critical care was time spent personally by me on the following activities: development of treatment plan with patient and/or surrogate as well as nursing, discussions with consultants, evaluation of patient's response to treatment, examination of patient, obtaining history from patient or surrogate, ordering and performing treatments and  interventions, ordering and review of laboratory studies, ordering and review of radiographic studies, pulse oximetry and re-evaluation of patient's condition.          Final Clinical Impression(s) / ED Diagnoses Final diagnoses:  Sepsis without acute organ dysfunction, due to unspecified organism [facility])  Pyelonephritis    Rx / DC Orders ED Discharge Orders     None         Godfrey Pick, MD 08/19/22 1635

## 2022-08-20 ENCOUNTER — Inpatient Hospital Stay (HOSPITAL_COMMUNITY): Payer: Medicare Other

## 2022-08-20 DIAGNOSIS — R7881 Bacteremia: Secondary | ICD-10-CM | POA: Diagnosis not present

## 2022-08-20 DIAGNOSIS — A419 Sepsis, unspecified organism: Secondary | ICD-10-CM | POA: Diagnosis not present

## 2022-08-20 DIAGNOSIS — Z0189 Encounter for other specified special examinations: Secondary | ICD-10-CM

## 2022-08-20 DIAGNOSIS — R6520 Severe sepsis without septic shock: Secondary | ICD-10-CM | POA: Diagnosis not present

## 2022-08-20 DIAGNOSIS — I482 Chronic atrial fibrillation, unspecified: Secondary | ICD-10-CM | POA: Diagnosis not present

## 2022-08-20 DIAGNOSIS — R001 Bradycardia, unspecified: Secondary | ICD-10-CM | POA: Diagnosis present

## 2022-08-20 DIAGNOSIS — K810 Acute cholecystitis: Secondary | ICD-10-CM

## 2022-08-20 DIAGNOSIS — N320 Bladder-neck obstruction: Secondary | ICD-10-CM | POA: Diagnosis not present

## 2022-08-20 LAB — BLOOD CULTURE ID PANEL (REFLEXED) - BCID2

## 2022-08-20 LAB — CBC
HCT: 33 % — ABNORMAL LOW (ref 39.0–52.0)
Hemoglobin: 10.6 g/dL — ABNORMAL LOW (ref 13.0–17.0)
MCH: 29.5 pg (ref 26.0–34.0)
MCHC: 32.1 g/dL (ref 30.0–36.0)
MCV: 91.9 fL (ref 80.0–100.0)
Platelets: 150 10*3/uL (ref 150–400)
RBC: 3.59 MIL/uL — ABNORMAL LOW (ref 4.22–5.81)
RDW: 14.6 % (ref 11.5–15.5)
WBC: 11.8 10*3/uL — ABNORMAL HIGH (ref 4.0–10.5)
nRBC: 0 % (ref 0.0–0.2)

## 2022-08-20 LAB — BASIC METABOLIC PANEL
Anion gap: 3 — ABNORMAL LOW (ref 5–15)
BUN: 17 mg/dL (ref 8–23)
CO2: 24 mmol/L (ref 22–32)
Calcium: 8 mg/dL — ABNORMAL LOW (ref 8.9–10.3)
Chloride: 111 mmol/L (ref 98–111)
Creatinine, Ser: 1.28 mg/dL — ABNORMAL HIGH (ref 0.61–1.24)
GFR, Estimated: 58 mL/min — ABNORMAL LOW (ref >60)
Glucose, Bld: 125 mg/dL — ABNORMAL HIGH (ref 70–99)
Potassium: 4 mmol/L (ref 3.5–5.1)
Sodium: 138 mmol/L (ref 135–145)

## 2022-08-20 LAB — HEPATIC FUNCTION PANEL
ALT: 73 U/L — ABNORMAL HIGH (ref 0–44)
AST: 71 U/L — ABNORMAL HIGH (ref 15–41)
Albumin: 2.7 g/dL — ABNORMAL LOW (ref 3.5–5.0)
Alkaline Phosphatase: 99 U/L (ref 38–126)
Bilirubin, Direct: 0.2 mg/dL (ref 0.0–0.2)
Indirect Bilirubin: 0.5 mg/dL (ref 0.3–0.9)
Total Bilirubin: 0.7 mg/dL (ref 0.3–1.2)
Total Protein: 5.3 g/dL — ABNORMAL LOW (ref 6.5–8.1)

## 2022-08-20 LAB — MAGNESIUM: Magnesium: 1.9 mg/dL (ref 1.7–2.4)

## 2022-08-20 LAB — MRSA NEXT GEN BY PCR, NASAL: MRSA by PCR Next Gen: NOT DETECTED

## 2022-08-20 MED ORDER — IBUPROFEN 400 MG PO TABS
400.0000 mg | ORAL_TABLET | Freq: Three times a day (TID) | ORAL | Status: DC | PRN
  Administered 2022-08-21: 400 mg via ORAL
  Filled 2022-08-20: qty 1

## 2022-08-20 MED ORDER — ACETAMINOPHEN 325 MG PO TABS
650.0000 mg | ORAL_TABLET | Freq: Four times a day (QID) | ORAL | Status: DC | PRN
  Filled 2022-08-20: qty 2

## 2022-08-20 MED ORDER — GABAPENTIN 100 MG PO CAPS
200.0000 mg | ORAL_CAPSULE | Freq: Three times a day (TID) | ORAL | Status: DC
  Administered 2022-08-22 – 2022-08-24 (×7): 200 mg via ORAL
  Filled 2022-08-20 (×12): qty 2

## 2022-08-20 MED ORDER — LACTATED RINGERS IV SOLN: INTRAVENOUS | Status: DC

## 2022-08-20 MED ORDER — CHLORHEXIDINE GLUCONATE CLOTH 2 % EX PADS
6.0000 | MEDICATED_PAD | Freq: Every day | CUTANEOUS | Status: DC
  Administered 2022-08-20 – 2022-08-24 (×5): 6 via TOPICAL

## 2022-08-20 MED ORDER — OXYBUTYNIN CHLORIDE ER 5 MG PO TB24
10.0000 mg | ORAL_TABLET | Freq: Every day | ORAL | Status: DC | PRN
  Administered 2022-08-23: 10 mg via ORAL
  Filled 2022-08-20: qty 2

## 2022-08-20 MED ORDER — SODIUM CHLORIDE 0.9 % IV SOLN
2.0000 g | Freq: Four times a day (QID) | INTRAVENOUS | Status: DC
  Filled 2022-08-20 (×5): qty 2000

## 2022-08-20 MED ORDER — METRONIDAZOLE 500 MG/100ML IV SOLN: 500.0000 mg | Freq: Two times a day (BID) | INTRAVENOUS | Status: DC

## 2022-08-20 MED ORDER — LACTATED RINGERS IV BOLUS
500.0000 mL | Freq: Once | INTRAVENOUS | Status: AC
  Administered 2022-08-20: 500 mL via INTRAVENOUS

## 2022-08-20 MED ORDER — IBUPROFEN 600 MG PO TABS
600.0000 mg | ORAL_TABLET | Freq: Once | ORAL | Status: AC
Start: 2022-08-20 — End: 2022-08-20
  Administered 2022-08-20: 600 mg via ORAL
  Filled 2022-08-20: qty 1

## 2022-08-20 MED ORDER — SODIUM CHLORIDE 0.9 % IV SOLN
3.0000 g | Freq: Four times a day (QID) | INTRAVENOUS | Status: DC
  Administered 2022-08-20 – 2022-08-22 (×8): 3 g via INTRAVENOUS
  Filled 2022-08-20 (×7): qty 8
  Filled 2022-08-20: qty 3
  Filled 2022-08-20 (×2): qty 8
  Filled 2022-08-20: qty 3
  Filled 2022-08-20 (×2): qty 8

## 2022-08-20 NOTE — Consult Note (Signed)
Urology Consult   History of Present Illness: Christopher [name] is a 76 y.o. who initially presented to the ED yesterday c/o episodes of hematuria and N/V. He was found to be febrile with low BP in the ED. A foley catheter placed yesterday evening. Christopher [name] was given cefepime in the ED yesterday and now is on rocephin. He was transferred to the ICU due to low BP and HR; since transfer these parameters have improved  I reviewed the CT scan from yesterday, which shows a distended bladder and urolift brackets in the prostate consistent with his recent history. Otherwise GU tract unremarkable. The study was notable for a distended gallbladder; RUQ [place] completed this morning and is suspicious for cholecystitis.  UA yesterday on admit positive for RBCs, small number of WBC. Blood cultures are prelim positive for gram + cocci. WBC trended upwards this AM  At the time of my exam, Christopher [name] feels OK. He endorses some bladder spasms overnight.   Past Medical History:  Diagnosis Date   A-fib [facility])    Arthritis    "all over my body" (06/17/2017)   BPH (benign prostatic hyperplasia)    takes Proscar daily   CAD (coronary artery disease)    a. s/p CABG in 09/2019 with SVG-PDA-OM and LIMA-LAD with LAA excision   Chronic back pain    DDD; "upper and lower" (06/17/2017)   Constipation    takes Colace daily   Coronary artery disease    DDD (degenerative disc disease), cervical    DVT (deep venous thrombosis) (HCC)    left peroneal vein 07/2017 (in setting of recent left TKA)   Dysrhythmia    history of Atrial fibrillation, no long has this   Enlarged prostate    GERD (gastroesophageal reflux disease)    takes Nexium daily   H/O hiatal hernia    History of colon polyps    benign   History of gastric ulcer    Insomnia    takes Xanax nightly   Joint capsule tear    left knee   Joint pain    Joint swelling    Macular degeneration    Muscle spasm of back    takes Valium daily as needed   PAF (paroxysmal  atrial fibrillation) (HCC)    Peripheral neuropathy    takes Gabapentin daily   PONV (postoperative nausea and vomiting)    Pre-diabetes    Sleep apnea    pt does not wear a cpap   Urinary hesitancy    Weakness    numbness and tingling    Past Surgical History:  Procedure Laterality Date   ANTERIOR CERVICAL DECOMP/DISCECTOMY FUSION  ~ 1991   "took bone from hip"   ANTERIOR CERVICAL DECOMP/DISCECTOMY FUSION  X 2   "put plate in"   ANTERIOR CERVICAL DISCECTOMY  <11/2005    5-6 and 4-5./notes 05/02/2011   APPENDECTOMY     BACK SURGERY     BIOPSY  05/30/2018   Procedure: BIOPSY;  Surgeon: [name], MD;  [facility];  Service: Endoscopy;;  esophagus   BIOPSY  01/09/2022   Procedure: BIOPSY;  Surgeon: Harvel Quale, MD;  [facility];  Service: Gastroenterology;;   CARDIAC CATHETERIZATION     3-4 per pt   CARPAL TUNNEL RELEASE Bilateral 10/2005 - 11/2005   right-left /notes 05/02/2011   CATARACT EXTRACTION W/PHACO Right 05/24/2014   Procedure: CATARACT EXTRACTION PHACO AND INTRAOCULAR LENS PLACEMENT ([place]);  Surgeon: [name], MD;  Location: [facility];  Service: Ophthalmology;  Laterality: Right;  CDE 6.74   CATARACT EXTRACTION W/PHACO Left 06/17/2014   Procedure: CATARACT EXTRACTION PHACO AND INTRAOCULAR LENS PLACEMENT (IOC);  Surgeon: [name], MD;  Location: [facility];  Service: Ophthalmology;  Laterality: Left;  CDE:4.65   COLONOSCOPY WITH ESOPHAGOGASTRODUODENOSCOPY (EGD) N/A 01/22/2013   Procedure: COLONOSCOPY WITH ESOPHAGOGASTRODUODENOSCOPY (EGD);  Surgeon: [name], MD;  [facility];  Service: Endoscopy;  Laterality: N/A;  140   COLONOSCOPY WITH PROPOFOL N/A 05/30/2018   diverticulosis and external hemorrhoids   CORONARY ARTERY BYPASS GRAFT  2020   CYSTOSCOPY WITH INSERTION OF UROLIFT N/A 04/16/2022   Procedure: CYSTOSCOPY WITH INSERTION OF UROLIFT;  Surgeon: Cleon Gustin, MD;  Location: [facility];  Service: Urology;   Laterality: N/A;   ESOPHAGOGASTRODUODENOSCOPY (EGD) WITH PROPOFOL N/A 05/30/2018   normal esophagus, presence of possible barretts (bx consistent with reflux changes, GAVE)   ESOPHAGOGASTRODUODENOSCOPY (EGD) WITH PROPOFOL N/A 01/09/2022   Procedure: ESOPHAGOGASTRODUODENOSCOPY (EGD) WITH PROPOFOL;  Surgeon: Harvel Quale, MD;  [facility];  Service: Gastroenterology;  Laterality: N/A;  305, moved up per Leigh Amm   EYE SURGERY Bilateral    "cleaned film w/laser; since my 1st cataract OR"   JOINT REPLACEMENT     LEFT HEART CATH AND CORONARY ANGIOGRAPHY N/A 02/16/2019   Procedure: LEFT HEART CATH AND CORONARY ANGIOGRAPHY;  Surgeon: Belva Crome, MD;  Location: [place] [facility];  Service: Cardiovascular;  Laterality: N/A;   POSTERIOR LUMBAR FUSION     "put a plate in"   QUADRICEPS TENDON REPAIR Left 07/01/2017   QUADRICEPS TENDON REPAIR Left 07/01/2017   Procedure: REPAIR QUADRICEP TENDON;  Surgeon: [name], MD;  Location: [place];  Service: Orthopedics;  Laterality: Left;   SHOULDER ARTHROSCOPY Bilateral    SPINAL CORD STIMULATOR INSERTION N/A 09/23/2015   Procedure: LUMBAR SPINAL CORD STIMULATOR INSERTION;  Surgeon: [name], MD;  Location: [place] [facility];  Service: Neurosurgery;  Laterality: N/A;  LUMBAR SPINAL CORD STIMULATOR INSERTION   SPINAL CORD STIMULATOR INSERTION N/A 04/25/2022   Procedure: REVISION OF  SPINAL CORD STIMULATOR;  Surgeon: Newman Pies, MD;  Location: [place];  Service: Neurosurgery;  Laterality: N/A;   TOTAL KNEE ARTHROPLASTY Left 06/17/2017   TOTAL KNEE ARTHROPLASTY Left 06/17/2017   Procedure: TOTAL KNEE ARTHROPLASTY;  Surgeon: [name], MD;  Location: [place];  Service: Orthopedics;  Laterality: Left;   TOTAL KNEE ARTHROPLASTY Right 12/16/2017   Procedure: TOTAL KNEE ARTHROPLASTY;  Surgeon: [name], MD;  Location: [place];  Service: Orthopedics;  Laterality: Right;    Current Hospital Medications:  Home Meds:  No current  facility-administered medications on file prior to encounter.   Current Outpatient Medications on File Prior to Encounter  Medication Sig Dispense Refill   ALPRAZolam (XANAX) 1 MG tablet Take 1 mg by mouth at bedtime.  0   amiodarone (PACERONE) 200 MG tablet TAKE 1 TABLET BY MOUTH EVERY DAY (Patient taking differently: Take 200 mg by mouth daily.) 90 tablet 3   apixaban (ELIQUIS) 5 MG TABS tablet Take 1 tablet (5 mg total) by mouth 2 (two) times daily. *Restart 04/28/2022* (Patient taking differently: Take 5 mg by mouth 2 (two) times daily.) 60 tablet    aspirin EC 81 MG tablet Take 1 tablet (81 mg total) by mouth daily. 90 tablet 3   atorvastatin (LIPITOR) 80 MG tablet Take 1 tablet (80 mg total) by mouth daily. 90 tablet 3   Calcium Polycarbophil (FIBER-CAPS  PO) Take 2 capsules by mouth daily.     Cholecalciferol (VITAMIN D3) 10 MCG (400 UNIT) CAPS Take 400 Units by mouth daily.     Cyanocobalamin (B-12 PO) Take 1 tablet by mouth daily.     docusate sodium (COLACE) 250 MG capsule Take 250 mg by mouth daily.     finasteride (PROSCAR) 5 MG tablet TAKE 1 TABLET (5 MG TOTAL) BY MOUTH DAILY. 90 tablet 3   furosemide (LASIX) 20 MG tablet Take 1 tablet (20 mg total) by mouth daily. 90 tablet 3   gabapentin (NEURONTIN) 400 MG capsule One three times a day (Patient taking differently: Take 400 mg by mouth 3 (three) times daily.)     lubiprostone (AMITIZA) 24 MCG capsule Take 1 capsule (24 mcg total) by mouth 2 (two) times daily with a meal. 180 capsule 3   nitroGLYCERIN (NITROSTAT) 0.4 MG SL tablet Place 1 tablet (0.4 mg total) under the tongue every 5 (five) minutes as needed for chest pain. 25 tablet 5   Omega-3 1000 MG CAPS Take 1,000 mg by mouth daily.     pantoprazole (PROTONIX) 40 MG tablet TAKE 1 TABLET BY MOUTH TWICE A DAY (30 TO 60 MINUTES BEFORE 1ST AND LAST MEALS OF THE DAY) (Patient taking differently: 40 mg 2 (two) times daily. (30 TO 60 MINUTES BEFORE 1ST AND LAST MEALS OF THE DAY)) 180  tablet 0   potassium chloride (KLOR-CON) 10 MEQ tablet Take 10 mEq by mouth daily.     Propylene Glycol (SYSTANE BALANCE) 0.6 % SOLN Place 1 drop into both eyes daily as needed (dry eyes).     silodosin (RAPAFLO) 8 MG CAPS capsule Take 1 capsule (8 mg total) by mouth at bedtime. 30 capsule 11   Simethicone 180 MG CAPS Take 1 capsule (180 mg total) by mouth 3 (three) times daily as needed. (Patient taking differently: Take 180 mg by mouth 3 (three) times daily as needed (bloating).)  0   traMADol (ULTRAM) 50 MG tablet Take 1-2 tablets (50-100 mg total) by mouth every 4 (four) hours as needed. 48 tablet 0   vitamin E 200 UNIT capsule Take 200 Units by mouth daily.     Zinc Sulfate (ZINC 15 PO) Take 15 mg by mouth daily.     nitrofurantoin, macrocrystal-monohydrate, (MACROBID) 100 MG capsule Take 1 capsule (100 mg total) by mouth every 12 (twelve) hours. (Patient not taking: Reported on 08/19/2022) 14 capsule 0   sulfamethoxazole-trimethoprim (BACTRIM DS) 800-160 MG tablet Take 1 tablet by mouth every 12 (twelve) hours. 42 tablet 0     Scheduled Meds:  ALPRAZolam  1 mg Oral QHS   amiodarone  200 mg Oral Daily   atorvastatin  80 mg Oral Daily   finasteride  5 mg Oral Daily   gabapentin  200 mg Oral TID   lubiprostone  24 mcg Oral BID WC   pantoprazole  40 mg Oral BID   polyethylene glycol  17 g Oral BID   tamsulosin  0.4 mg Oral QPC supper   Continuous Infusions:  cefTRIAXone (ROCEPHIN)  IV Stopped (08/20/22 0034)   lactated ringers     PRN Meds:.acetaminophen, promethazine  Allergies:  Allergies  Allergen Reactions   Tylenol [Acetaminophen] Other (See Comments)    Stomach pain    Morphine And Related Nausea And Vomiting   Propofol Nausea And Vomiting    History reviewed. No pertinent family history.  Social History:  reports that he quit smoking about 35 years ago. His smoking  use included cigarettes. He has a 30.00 pack-year smoking history. He has quit using smokeless tobacco.   His smokeless tobacco use included chew. He reports current alcohol use. He reports that he does not use drugs.  ROS: A complete review of systems was performed.  All systems are negative except for pertinent findings as noted.  Physical Exam:  Vital signs in last 24 hours: Temp:  [97.7 F (36.5 C)-103 F (39.4 C)] 101.2 F (38.4 C) (09/04 0709) Pulse Rate:  [38-105] 38 (09/04 0736) Resp:  [12-31] 18 (09/04 0736) BP: (73-145)/(37-124) 109/47 (09/04 0736) SpO2:  [86 %-98 %] 94 % (09/04 0709) FiO2 (%):  [100 %] 100 % (09/03 1715) Weight:  [69.9 kg-74.8 kg] 74.8 kg (09/04 0626) Constitutional:  Alert and oriented, No acute distress Cardiovascular: Regular rate and rhythm Respiratory: Normal respiratory effort, Lungs clear bilaterally GI: Abdomen is soft, nontender, nondistended, no abdominal masses GU: No CVA tenderness; foley in place draining clear yellow urine Neurologic: Grossly intact, no focal deficits Psychiatric: Normal mood and affect  Laboratory Data:  Recent Labs    08/19/22 1208 08/20/22 0453  WBC 9.8 11.8*  HGB 13.6 10.6*  HCT 42.6 33.0*  PLT 232 150    Recent Labs    08/19/22 1208 08/20/22 0453  NA 140 138  K 3.9 4.0  CL 107 111  GLUCOSE 100* 125*  BUN 12 17  CALCIUM 9.0 8.0*  CREATININE 1.07 1.28*     Results for orders placed or performed during the hospital encounter of 08/19/22 (from the past 24 hour(s))  Lactic acid, plasma     Status: Abnormal   Collection Time: 08/19/22 12:02 PM  Result Value Ref Range   Lactic Acid, Venous 4.9 (HH) 0.5 - 1.9 mmol/L  Resp Panel by RT-PCR (Flu A&B, Covid) Anterior Nasal Swab     Status: None   Collection Time: 08/19/22 12:02 PM   Specimen: Anterior Nasal Swab  Result Value Ref Range   SARS Coronavirus 2 by RT PCR NEGATIVE NEGATIVE   Influenza A by PCR NEGATIVE NEGATIVE   Influenza B by PCR NEGATIVE NEGATIVE  Culture, blood (Routine x 2)     Status: None (Preliminary result)   Collection Time: 08/19/22  12:07 PM   Specimen: Right Antecubital; Blood  Result Value Ref Range   Specimen Description      RIGHT ANTECUBITAL BOTTLES DRAWN AEROBIC AND ANAEROBIC   Special Requests      Blood Culture adequate volume Performed at [facility], [address], [place], [place] [address]    Culture PENDING    Report Status PENDING   Comprehensive metabolic panel     Status: Abnormal   Collection Time: 08/19/22 12:08 PM  Result Value Ref Range   Sodium 140 135 - 145 mmol/L   Potassium 3.9 3.5 - 5.1 mmol/L   Chloride 107 98 - 111 mmol/L   CO2 23 22 - 32 mmol/L   Glucose, Bld 100 (H) 70 - 99 mg/dL   BUN 12 8 - 23 mg/dL   Creatinine, Ser 1.07 0.61 - 1.24 mg/dL   Calcium 9.0 8.9 - 10.3 mg/dL   Total Protein 6.9 6.5 - 8.1 g/dL   Albumin 3.7 3.5 - 5.0 g/dL   AST 65 (H) 15 - 41 U/L   ALT 69 (H) 0 - 44 U/L   Alkaline Phosphatase 126 38 - 126 U/L   Total Bilirubin 1.2 0.3 - 1.2 mg/dL   GFR, Estimated >60 >60 mL/min   Anion gap  10 5 - 15  CBC with Differential     Status: Abnormal   Collection Time: 08/19/22 12:08 PM  Result Value Ref Range   WBC 9.8 4.0 - 10.5 K/uL   RBC 4.62 4.22 - 5.81 MIL/uL   Hemoglobin 13.6 13.0 - 17.0 g/dL   HCT 42.6 39.0 - 52.0 %   MCV 92.2 80.0 - 100.0 fL   MCH 29.4 26.0 - 34.0 pg   MCHC 31.9 30.0 - 36.0 g/dL   RDW 14.2 11.5 - 15.5 %   Platelets 232 150 - 400 K/uL   nRBC 0.0 0.0 - 0.2 %   Neutrophils Relative % 87 %   Neutro Abs 8.6 (H) 1.7 - 7.7 K/uL   Lymphocytes Relative 11 %   Lymphs Abs 1.1 0.7 - 4.0 K/uL   Monocytes Relative 1 %   Monocytes Absolute 0.1 0.1 - 1.0 K/uL   Eosinophils Relative 0 %   Eosinophils Absolute 0.0 0.0 - 0.5 K/uL   Basophils Relative 0 %   Basophils Absolute 0.0 0.0 - 0.1 K/uL   Immature Granulocytes 1 %   Abs Immature Granulocytes 0.05 0.00 - 0.07 K/uL  Protime-INR     Status: Abnormal   Collection Time: 08/19/22 12:08 PM  Result Value Ref Range   Prothrombin Time 15.3 (H) 11.4 - 15.2 seconds   INR 1.2 0.8 - 1.2  Culture,  blood (Routine x 2)     Status: None (Preliminary result)   Collection Time: 08/19/22 12:08 PM   Specimen: Left Antecubital; Blood  Result Value Ref Range   Specimen Description      LEFT ANTECUBITAL BOTTLES DRAWN AEROBIC AND ANAEROBIC   Special Requests      Blood Culture adequate volume Performed at [facility], [address], [place], [place] [address]    Culture PENDING    Report Status PENDING   Magnesium     Status: Abnormal   Collection Time: 08/19/22 12:12 PM  Result Value Ref Range   Magnesium 1.5 (L) 1.7 - 2.4 mg/dL  Lactic acid, plasma     Status: Abnormal   Collection Time: 08/19/22  1:28 PM  Result Value Ref Range   Lactic Acid, Venous 3.2 (HH) 0.5 - 1.9 mmol/L  Urinalysis, Routine w reflex microscopic     Status: Abnormal   Collection Time: 08/19/22  1:56 PM  Result Value Ref Range   Color, Urine YELLOW YELLOW   APPearance HAZY (A) CLEAR   Specific Gravity, Urine 1.014 1.005 - 1.030   pH 5.0 5.0 - 8.0   Glucose, UA NEGATIVE NEGATIVE mg/dL   Hgb urine dipstick LARGE (A) NEGATIVE   Bilirubin Urine NEGATIVE NEGATIVE   Ketones, ur NEGATIVE NEGATIVE mg/dL   Protein, ur NEGATIVE NEGATIVE mg/dL   Nitrite NEGATIVE NEGATIVE   Leukocytes,Ua SMALL (A) NEGATIVE   RBC / HPF >50 (H) 0 - 5 RBC/hpf   WBC, UA 21-50 0 - 5 WBC/hpf   Bacteria, UA RARE (A) NONE SEEN   Squamous Epithelial / LPF 0-5 0 - 5   Mucus PRESENT   Lactic acid, plasma     Status: None   Collection Time: 08/19/22  7:18 PM  Result Value Ref Range   Lactic Acid, Venous 1.9 0.5 - 1.9 mmol/L  Basic metabolic panel     Status: Abnormal   Collection Time: 08/20/22  4:53 AM  Result Value Ref Range   Sodium 138 135 - 145 mmol/L   Potassium 4.0 3.5 - 5.1 mmol/L  Chloride 111 98 - 111 mmol/L   CO2 24 22 - 32 mmol/L   Glucose, Bld 125 (H) 70 - 99 mg/dL   BUN 17 8 - 23 mg/dL   Creatinine, Ser 1.28 (H) 0.61 - 1.24 mg/dL   Calcium 8.0 (L) 8.9 - 10.3 mg/dL   GFR, Estimated 58 (L) >60 mL/min   Anion gap 3 (L) 5  - 15  CBC     Status: Abnormal   Collection Time: 08/20/22  4:53 AM  Result Value Ref Range   WBC 11.8 (H) 4.0 - 10.5 K/uL   RBC 3.59 (L) 4.22 - 5.81 MIL/uL   Hemoglobin 10.6 (L) 13.0 - 17.0 g/dL   HCT 33.0 (L) 39.0 - 52.0 %   MCV 91.9 80.0 - 100.0 fL   MCH 29.5 26.0 - 34.0 pg   MCHC 32.1 30.0 - 36.0 g/dL   RDW 14.6 11.5 - 15.5 %   Platelets 150 150 - 400 K/uL   nRBC 0.0 0.0 - 0.2 %  Magnesium     Status: None   Collection Time: 08/20/22  4:53 AM  Result Value Ref Range   Magnesium 1.9 1.7 - 2.4 mg/dL   Recent Results (from the past 240 hour(s))  Resp Panel by RT-PCR (Flu A&B, Covid) Anterior Nasal Swab     Status: None   Collection Time: 08/19/22 12:02 PM   Specimen: Anterior Nasal Swab  Result Value Ref Range Status   SARS Coronavirus 2 by RT PCR NEGATIVE NEGATIVE Final    Comment: (NOTE) SARS-CoV-2 target nucleic acids are NOT DETECTED.  The SARS-CoV-2 RNA is generally detectable in upper respiratory specimens during the acute phase of infection. The lowest concentration of SARS-CoV-2 viral copies this assay can detect is 138 copies/mL. A negative result does not preclude SARS-Cov-2 infection and should not be used as the sole basis for treatment or other patient management decisions. A negative result may occur with  improper specimen collection/handling, submission of specimen other than nasopharyngeal swab, presence of viral mutation(s) within the areas targeted by this assay, and inadequate number of viral copies(<138 copies/mL). A negative result must be combined with clinical observations, patient history, and epidemiological information. The expected result is Negative.  Fact Sheet for Patients:  EntrepreneurPulse.com.au  Fact Sheet for Healthcare Providers:  IncredibleEmployment.be  This test is no t yet approved or cleared by the [place] FDA and  has been authorized for detection and/or diagnosis of SARS-CoV-2  by FDA under an Emergency Use Authorization (EUA). This EUA will remain  in effect (meaning this test can be used) for the duration of the COVID-19 declaration under Section 564(b)(1) of the Act, 21 U.S.C.section 360bbb-3(b)(1), unless the authorization is terminated  or revoked sooner.       Influenza A by PCR NEGATIVE NEGATIVE Final   Influenza B by PCR NEGATIVE NEGATIVE Final    Comment: (NOTE) The Xpert Xpress SARS-CoV-2/FLU/RSV plus assay is intended as an aid in the diagnosis of influenza from Nasopharyngeal swab specimens and should not be used as a sole basis for treatment. Nasal washings and aspirates are unacceptable for Xpert Xpress SARS-CoV-2/FLU/RSV testing.  Fact Sheet for Patients: EntrepreneurPulse.com.au  Fact Sheet for Healthcare Providers: IncredibleEmployment.be  This test is not yet approved or cleared by the [place] FDA and has been authorized for detection and/or diagnosis of SARS-CoV-2 by FDA under an Emergency Use Authorization (EUA). This EUA will remain in effect (meaning this test can be used) for the duration of the COVID-19  declaration under Section 564(b)(1) of the Act, 21 U.S.C. section 360bbb-3(b)(1), unless the authorization is terminated or revoked.  Performed at [facility], [address] [place], [place] [address]   Culture, blood (Routine x 2)     Status: None (Preliminary result)   Collection Time: 08/19/22 12:07 PM   Specimen: Right Antecubital; Blood  Result Value Ref Range Status   Specimen Description   Final    RIGHT ANTECUBITAL BOTTLES DRAWN AEROBIC AND ANAEROBIC   Special Requests   Final    Blood Culture adequate volume Performed at [facility], [address], [place], [place] [address]    Culture PENDING  Incomplete   Report Status PENDING  Incomplete  Culture, blood (Routine x 2)     Status: None (Preliminary result)   Collection Time: 08/19/22 12:08 PM   Specimen: Left  Antecubital; Blood  Result Value Ref Range Status   Specimen Description   Final    LEFT ANTECUBITAL BOTTLES DRAWN AEROBIC AND ANAEROBIC   Special Requests   Final    Blood Culture adequate volume Performed at [facility], [address] [place], [place] [address]    Culture PENDING  Incomplete   Report Status PENDING  Incomplete    Renal Function: Recent Labs    08/19/22 1208 08/20/22 0453  CREATININE 1.07 1.28*   Estimated Creatinine Clearance: 46.8 mL/min (A) (by C-G formula based on SCr of 1.28 mg/dL (H)).  Radiologic Imaging: CT ABDOMEN PELVIS W CONTRAST  Result Date: 08/19/2022 CLINICAL DATA:  Sepsis. Vomiting. [age] year. Patient reports surgery 1 month ago on penis. EXAM: CT ABDOMEN AND PELVIS WITH CONTRAST TECHNIQUE: Multidetector CT imaging of the abdomen and pelvis was performed using the standard protocol following bolus administration of intravenous contrast. RADIATION DOSE REDUCTION: This exam was performed according to the departmental dose-optimization program which includes automated exposure control, adjustment of the mA and/or kV according to patient size and/or use of iterative reconstruction technique. CONTRAST:  180m OMNIPAQUE IOHEXOL 350 MG/ML SOLN COMPARISON:  Abdominopelvic CT 02/06/2022 FINDINGS: Lower chest: Dependent atelectasis with trace bilateral pleural thickening, no significant effusion. Cardiomegaly with coronary artery calcifications. Prior median sternotomy. Hepatobiliary: There is no focal hepatic lesion. Gallbladder is distended. Patient motion artifact through the gallbladder limits assessment for pericholecystic inflammation. There is no definite wall thickening. 15 mm rounded high-density in the gallbladder likely represent gallstones, with compared with 06/26/2022 abdominal ultrasound. There is diffuse periportal edema. No biliary dilatation. Pancreas: No ductal dilatation or inflammation. Spleen: Tiny subcentimeter hypodensity in the lower spleen,  nonspecific. Spleen is normal in size. Adrenals/Urinary Tract: Normal adrenal glands. Mild prominence of the right renal collecting system without frank hydronephrosis. Mild thinning of bilateral renal parenchyma calcification in the right renal hilum is felt to be vascular rather than nonobstructing stone. Elongated urinary bladder which extends to the level of the umbilicus. No bladder wall thickening. Stomach/Bowel: No bowel obstruction or inflammatory change. Moderate volume of colonic stool. There is stool distending the rectum. No rectal or colonic wall thickening. Stomach is decompressed. Appendix not visualized. Vascular/Lymphatic: Aortic and branch atherosclerosis. No aortic aneurysm. Patent portal vein. Prominent left inguinal node measures 10 mm short axis. This is slightly more prominent than on prior exam. Reproductive: Surgical clips in the prostate which is prominent size. No evidence of prostatic fluid collection. The included portion of the penis and urethra are unremarkable. Other: No ascites. No free air. Tiny fat containing umbilical hernia. Minimal fat in the left inguinal canal. Musculoskeletal: Postsurgical change in  the lumbar spine. Spinal stimulator in place. No focal bone lesions. IMPRESSION: 1. Distended gallbladder with gallstones. Patient motion artifact through the gallbladder limits assessment for pericholecystic inflammation. Consider right upper quadrant ultrasound for further evaluation as clinically indicated. 2. Moderate volume of colonic stool with stool distending the rectum, suggesting constipation. 3. Elongated urinary bladder which extends to the level of the umbilicus, can be seen with urinary retention. No bladder wall thickening. 4. Prominent left inguinal node is slightly more prominent than on prior exam, likely reactive. 5. Additional chronic findings as described. Aortic Atherosclerosis (ICD10-I70.0). Electronically Signed   By: Keith Rake M.D.   On: 08/19/2022  16:44   DG Chest Port 1 View  Result Date: 08/19/2022 CLINICAL DATA:  Fever.  Recent surgery. EXAM: PORTABLE CHEST 1 VIEW COMPARISON:  01/01/2021. FINDINGS: Stable changes from prior cardiac surgery. No mediastinal or hilar masses. Clear lungs.  No pleural effusion or pneumothorax. Skeletal structures are grossly intact. IMPRESSION: No active disease. Electronically Signed   By: Lajean Manes M.D.   On: 08/19/2022 12:30    I independently reviewed the above imaging studies.  Impression/Recommendation 76 yo M with sepsis, origin unclear at this time although possible GU vs cholecystitis. Foley placed for urinary retention/incomplete emptying. Blood cultures prelim positive; s/p urolift several weeks ago -continue foley catheter -f/u culture results.  -following  Donald Pore MD 08/20/2022, 7:41 AM  [facility]  Pager: [phone]

## 2022-08-20 NOTE — Progress Notes (Signed)
Nurse called about patients spo2 in 80's. Patient's spo2 82-85% on 2lpm cann when RT arrived to room increased o2 to 4lpm cann with no success of increaaseing. Patoent placed on 8lpm HFNC spo2 slowing increasing 90% when RT left room . RT will continue to monitor

## 2022-08-20 NOTE — Progress Notes (Signed)
PHARMACY - PHYSICIAN COMMUNICATION CRITICAL VALUE ALERT - BLOOD CULTURE IDENTIFICATION (BCID)  Christopher [name] is an 76 y.o. male who presented to [place] on 08/19/2022=  Assessment:  BCID enterococcus faecalis  Name of physician (or Provider) Contacted: Dr. Wynetta Emery  Current antibiotics: CTX + Flagyl  Changes to prescribed antibiotics recommended:  Recommendations accepted by provider- Changing ceftriaxone and flagyl to Unasyn  Results for orders placed or performed during the hospital encounter of 08/19/22  Blood Culture ID Panel (Reflexed) (Collected: 08/19/2022 12:08 PM)  Result Value Ref Range   Enterococcus faecalis DETECTED (A) NOT DETECTED   Enterococcus Faecium NOT DETECTED NOT DETECTED   Listeria monocytogenes NOT DETECTED NOT DETECTED   Staphylococcus species NOT DETECTED NOT DETECTED   Staphylococcus aureus (BCID) NOT DETECTED NOT DETECTED   Staphylococcus epidermidis NOT DETECTED NOT DETECTED   Staphylococcus lugdunensis NOT DETECTED NOT DETECTED   Streptococcus species NOT DETECTED NOT DETECTED   Streptococcus agalactiae NOT DETECTED NOT DETECTED   Streptococcus pneumoniae NOT DETECTED NOT DETECTED   Streptococcus pyogenes NOT DETECTED NOT DETECTED   A.calcoaceticus-baumannii NOT DETECTED NOT DETECTED   Bacteroides fragilis NOT DETECTED NOT DETECTED   Enterobacterales NOT DETECTED NOT DETECTED   Enterobacter cloacae complex NOT DETECTED NOT DETECTED   Escherichia coli NOT DETECTED NOT DETECTED   Klebsiella aerogenes NOT DETECTED NOT DETECTED   Klebsiella oxytoca NOT DETECTED NOT DETECTED   Klebsiella pneumoniae NOT DETECTED NOT DETECTED   Proteus species NOT DETECTED NOT DETECTED   Salmonella species NOT DETECTED NOT DETECTED   Serratia marcescens NOT DETECTED NOT DETECTED   Haemophilus influenzae NOT DETECTED NOT DETECTED   Neisseria meningitidis NOT DETECTED NOT DETECTED   Pseudomonas aeruginosa NOT DETECTED NOT DETECTED   Stenotrophomonas maltophilia NOT  DETECTED NOT DETECTED   Candida albicans NOT DETECTED NOT DETECTED   Candida auris NOT DETECTED NOT DETECTED   Candida glabrata NOT DETECTED NOT DETECTED   Candida krusei NOT DETECTED NOT DETECTED   Candida parapsilosis NOT DETECTED NOT DETECTED   Candida tropicalis NOT DETECTED NOT DETECTED   Cryptococcus neoformans/gattii NOT DETECTED NOT DETECTED   Vancomycin resistance NOT DETECTED NOT DETECTED    Margot Ables, PharmD Clinical Pharmacist 08/20/2022 12:47 PM

## 2022-08-20 NOTE — Hospital Course (Addendum)
76 y.o. male with medical history significant for gastric ulcer, coronary artery disease, BPH, atrial fibrillation on chronic anticoagulation. Patient presented to the ED with complaints of bleeding from his urethra today after self cath.  Patient had a UroLift procedure- 08/01/22 by Dr. [name].  Since then he has been having to self cath every other day, on the days he does not self cath, he reports normal urine.  Reports 2 days ago, he was having difficulty inserting the Foley catheter and he had pain but he was able to pass a true.  Yesterday he had he believes normal amount of urine, without blood.  Today he had a little bit of pain again, when he was self cathing, but subsequently started seeing blood, with clots.  He reports he tried milking his penis, get out the blood, and suddenly a jet of blood came out from his penis onto the wall with blood clots.  He reports some pain in his lower abdomen prior to this, but after the blood came out, he had relief. Reports 2 episodes of vomiting today, he otherwise denies difficulty breathing, no cough, no diarrhea.,  No pain or redness of his extremities. He is on Eliquis and has been compliant, his last dose of Eliquis was this morning   ED Course: Febrile to 103.  Heart rate 57-105.  Respirate rate 15-31.  Blood pressure systolic 62-035.  Lactic acidosis 4.9 > 3.2.  WBC 9.8.  UA with small leukocytes blood, 21-50 WBCs. 2.25 L sepsis fluid bolus given, maintenance of LR at 150.  Portable chest x-ray unremarkable.  Renal ultrasound could not be obtained so CT was ordered   CT abdomen and pelvis with contrast-distended gallbladder with gallstones, RUQ ultrasound recommended.  Moderate volume of stool, elongated urinary bladder which extends to the level of the umbilicus, seen with urinary retention, no bladder wall thickening.   Cefepime vancomycin and metronidazole started. Once cultures returned to reveal E. Faecalis, antibiotics were changed to Unasyn  which was then de-escalated to amoxil.

## 2022-08-20 NOTE — Assessment & Plan Note (Addendum)
--   discussed with surgery and we will get a HIDA scan today -- last dose of apixaban was 9/3 in AM -- clear liquid diet for now -- follow up LFTs -- continue IV antibiotic (amp/sulbactam) -- awaiting TTE for preoperative evaluation -- requesting cardiology consult for preoperative evaluation per surgery request

## 2022-08-20 NOTE — Progress Notes (Signed)
  Echocardiogram 2D Echocardiogram has been performed.  Christopher [name] 08/20/2022, 3:51 PM

## 2022-08-20 NOTE — TOC Progression Note (Signed)
Transition of Care [facility]) - Progression Note    Patient Details  Name: Christopher [name] MRN: [id number] Date of Birth: 1946-11-27  Transition of Care [facility]) CM/SW Contact  Salome Arnt, [place] Phone Number: 08/20/2022, 10:10 AM  Clinical Narrative:  Transition of Care [facility]) Screening Note   Patient Details  Name: Christopher [name] Date of Birth: [birth year]   Transition of Care [facility]) CM/SW Contact:    Salome Arnt, [place] Phone Number: 08/20/2022, 10:10 AM    Transition of Care Department [facility]) has reviewed patient and no TOC needs have been identified at this time. We will continue to monitor patient advancement through interdisciplinary progression rounds. If new patient transition needs arise, please place a TOC consult.          Barriers to Discharge: Continued Medical Work up  Expected Discharge Plan and Services                                                 Social Determinants of Health (SDOH) Interventions    Readmission Risk Interventions     No data to display

## 2022-08-20 NOTE — Assessment & Plan Note (Addendum)
--   source = urine -- Appreciate ID consult -- 08/21/23 TTE EF 55-60%, no WMA, G2DD, mild TR, no veg -- if repeated BCs positive then would need a TEE per Dr. Gale Journey -- continue IV antibiotics>>changed to po amoxil 9/7 -- 9/4 blood cult--NGTD -- d/c home with 10 more days amoxil

## 2022-08-20 NOTE — Progress Notes (Signed)
Per tele pt heart rate brady in 30's, vs obtained and bp 73/58 and pulse 76. Asia Zierle-Ghosh, DO notified with new orders given.

## 2022-08-20 NOTE — Progress Notes (Signed)
[place] foley inserted with no difficulty x 2 Rns at bedside. Clear yellow urine noted draining into foley tubing. Jenetta Downer, MD made aware of insertion of 38 F Coude foley.

## 2022-08-20 NOTE — Consult Note (Signed)
[facility] for Infectious Disease    Date of Admission:  08/19/2022     Reason for Consult: e faecalis bacteremia    Referring Provider: Willeen Cass    Abx: 9/4-c amp-sulb       9/03 vanc, cefepime, metronidazole   Assessment: 76 yo male pmh bilateral knee replacement, lumbar spine surgery/hardware, recent urolift 8/16, and having to straight cath subsequently admitted for 1 day of acute onset malaise, shaking chill along with 2 days of some bleeding in his urine in settin gof being on eliquis for afib  9/3 bcx 2 of 2 set e faecalis Ucx in process  He has high burden e faecalis bacteremia on admission No sign of uti otherwise  I do believe the source of e faecalis bacteremia is urinary  His denova score is rather low and his story is consistent with something of a "lower risk" for valve seeding    I am awared of his u/s and ct finding of the pericystic changes but he has no symptoms there in terms of tenderness/n/v or abd pain to suggest cholecystitis. His lft is mildly elevated ?sepsis related    Plan: Repeat blood cx, obtain tte If concern for cholecystitis could consider hida scan  Can continue amp-sulb for now; and perhaps narrow to just ampicillin once urine cx result (if there are gram negative while he is bleeding in gu area can continue for 3 days amp-sulb to prevent translocation into blood stream) If obvious concerning finding on tte or repeat bcx is still positive for e faecalis, it would be reasonable to proceed with tee But if both are negative/unconcerning will plan to treat with 2 weeks of abx, and he'll need ID clinic f/u 2-3 weeks after finish abx course to get surveillance bcx and reexamine back/knee Oral linezolid vs high dose amoxicillin potentially could be used if it is sensitive Discussed with primary team    I verified that I was speaking with the correct person using two identifiers. Due to the COVID-19  Pandemic/patient at a different hospital, this service was provided via telemedicine using audio/visual media.   The patient was located at [employer] [facility] The provider was located in [place] [facility] The patient did consent to this visit and is aware of charges through their insurance as well as the limitations of evaluation and management by telemedicine. Other persons participating in this telemedicine service were -- Fredric Mare, icu nurse for him     I spent 75 minute reviewing data/chart, and coordinating care and >50% direct face to face time providing counseling/discussing diagnostics/treatment plan with patient      ------------------------------------------------ Principal Problem:   Severe sepsis [facility]) Active Problems:   Coronary artery disease involving native coronary artery of native heart with angina pectoris ([place])   Atrial fibrillation, chronic (HCC)   Urinary tract infection   Bladder outlet obstruction   Hypomagnesemia   Bacteremia   Cholecystitis, acute    HPI: Christopher [name] is a 76 y.o. male admitted with 1 day shaking chill and 2 days gu bleeding found to have sepsis/e faecalis bacteremia  Chart reviewed Televideo consult with patient and his icu nurse Fredric Mare    Patient reports doing well since recent urolift 8/16. He straight cath since procedures. 2 days prior to straight cath noticed some gu bleeding no pain. He is on eliquis for afib  The day of presentation he noticed acute onset subjective fever/chill and some  weakness and this was his main reason for presentation   At [facility] He  has fever to 103 and elevated lactate.  Ua 21-50 wbc, some blood Ct abd distended gall bladder with stone otherwise no acute process Started on empiric abx Bcx returns 2 of 2 set e faecalis by bcid Prior urine culture (06/2022 and 05/2022 had e faecalis and kleb oxytoca)  Abx changed today to amox-clav  He is getting oob with some assistance  He is  feeling better with supportive care/abx  He has chronic low grade bilateral R>L knee pain and prior bilateral knee arthroplasty. He has prior lumbar back surgery with some plate there. He reports no acute change in these area  He has no other intravascular device/pace maker  Denies n/v/dairrhea/dysuria/rash Denies headache  Urology evaluated patient and had placed foley  History reviewed. No pertinent family history.  Social History   Tobacco Use   Smoking status: Former    Packs/day: 1.00    Years: 30.00    Total pack years: 30.00    Types: Cigarettes    Quit date: 01/14/1987    Years since quitting: 35.6   Smokeless tobacco: Former    Types: Chew   Tobacco comments:    quit smoking  & chewing by 1987  Vaping Use   Vaping Use: Never used  Substance Use Topics   Alcohol use: Yes    Comment: 2-3 beers per night   Drug use: No    Allergies  Allergen Reactions   Tylenol [Acetaminophen] Other (See Comments)    Stomach pain    Morphine And Related Nausea And Vomiting   Propofol Nausea And Vomiting    Review of Systems: ROS All Other ROS was negative, except mentioned above   Past Medical History:  Diagnosis Date   A-fib ([place])    Arthritis    "all over my body" (06/17/2017)   BPH (benign prostatic hyperplasia)    takes Proscar daily   CAD (coronary artery disease)    a. s/p CABG in 09/2019 with SVG-PDA-OM and LIMA-LAD with LAA excision   Chronic back pain    DDD; "upper and lower" (06/17/2017)   Constipation    takes Colace daily   Coronary artery disease    DDD (degenerative disc disease), cervical    DVT (deep venous thrombosis) (HCC)    left peroneal vein 07/2017 (in setting of recent left TKA)   Dysrhythmia    history of Atrial fibrillation, no long has this   Enlarged prostate    GERD (gastroesophageal reflux disease)    takes Nexium daily   H/O hiatal hernia    History of colon polyps    benign   History of gastric ulcer    Insomnia    takes Xanax  nightly   Joint capsule tear    left knee   Joint pain    Joint swelling    Macular degeneration    Muscle spasm of back    takes Valium daily as needed   PAF (paroxysmal atrial fibrillation) (HCC)    Peripheral neuropathy    takes Gabapentin daily   PONV (postoperative nausea and vomiting)    Pre-diabetes    Sleep apnea    pt does not wear a cpap   Urinary hesitancy    Weakness    numbness and tingling       Scheduled Meds:  ALPRAZolam  1 mg Oral QHS   atorvastatin  80 mg Oral Daily   Chlorhexidine  Gluconate Cloth  6 each Topical Daily   finasteride  5 mg Oral Daily   gabapentin  200 mg Oral TID   lubiprostone  24 mcg Oral BID WC   pantoprazole  40 mg Oral BID   polyethylene glycol  17 g Oral BID   tamsulosin  0.4 mg Oral QPC supper   Continuous Infusions:  ampicillin-sulbactam (UNASYN) IV     lactated ringers 125 mL/hr at 08/20/22 0944   PRN Meds:.acetaminophen, ibuprofen, promethazine   OBJECTIVE: Blood pressure (!) 103/42, pulse (!) 48, temperature (!) 97.5 F (36.4 C), temperature source Oral, resp. rate (!) 21, height '5\' 8"'$  (1.727 m), weight 75.3 kg, SpO2 94 %.  Physical Exam  Per video review/nurse assistance, and chart review  General/constitutional: no distress, pleasant, conversant HEENT: Normocephalic CV: rrr no mrg Lungs: clear to auscultation, normal respiratory effort Abd: Soft, Nontender Ext: no edema Skin: No Rash Neuro: nonfocal MSK: no peripheral joint swelling/tenderness/warmth; back spines nontender   Foley in place   Lab Results Lab Results  Component Value Date   WBC 11.8 (H) 08/20/2022   HGB 10.6 (L) 08/20/2022   HCT 33.0 (L) 08/20/2022   MCV 91.9 08/20/2022   PLT 150 08/20/2022    Lab Results  Component Value Date   CREATININE 1.28 (H) 08/20/2022   BUN 17 08/20/2022   NA 138 08/20/2022   K 4.0 08/20/2022   CL 111 08/20/2022   CO2 24 08/20/2022    Lab Results  Component Value Date   ALT 73 (H) 08/20/2022   AST  71 (H) 08/20/2022   ALKPHOS 99 08/20/2022   BILITOT 0.7 08/20/2022      Microbiology: Recent Results (from the past 240 hour(s))  Resp Panel by RT-PCR (Flu A&B, Covid) Anterior Nasal Swab     Status: None   Collection Time: 08/19/22 12:02 PM   Specimen: Anterior Nasal Swab  Result Value Ref Range Status   SARS Coronavirus 2 by RT PCR NEGATIVE NEGATIVE Final    Comment: (NOTE) SARS-CoV-2 target nucleic acids are NOT DETECTED.  The SARS-CoV-2 RNA is generally detectable in upper respiratory specimens during the acute phase of infection. The lowest concentration of SARS-CoV-2 viral copies this assay can detect is 138 copies/mL. A negative result does not preclude SARS-Cov-2 infection and should not be used as the sole basis for treatment or other patient management decisions. A negative result may occur with  improper specimen collection/handling, submission of specimen other than nasopharyngeal swab, presence of viral mutation(s) within the areas targeted by this assay, and inadequate number of viral copies(<138 copies/mL). A negative result must be combined with clinical observations, patient history, and epidemiological information. The expected result is Negative.  Fact Sheet for Patients:  EntrepreneurPulse.com.au  Fact Sheet for Healthcare Providers:  IncredibleEmployment.be  This test is no t yet approved or cleared by the [place] FDA and  has been authorized for detection and/or diagnosis of SARS-CoV-2 by FDA under an Emergency Use Authorization (EUA). This EUA will remain  in effect (meaning this test can be used) for the duration of the COVID-19 declaration under Section 564(b)(1) of the Act, 21 U.S.C.section 360bbb-3(b)(1), unless the authorization is terminated  or revoked sooner.       Influenza A by PCR NEGATIVE NEGATIVE Final   Influenza B by PCR NEGATIVE NEGATIVE Final    Comment: (NOTE) The Xpert Xpress  SARS-CoV-2/FLU/RSV plus assay is intended as an aid in the diagnosis of influenza from Nasopharyngeal swab specimens and should not  be used as a sole basis for treatment. Nasal washings and aspirates are unacceptable for Xpert Xpress SARS-CoV-2/FLU/RSV testing.  Fact Sheet for Patients: EntrepreneurPulse.com.au  Fact Sheet for Healthcare Providers: IncredibleEmployment.be  This test is not yet approved or cleared by the [place] FDA and has been authorized for detection and/or diagnosis of SARS-CoV-2 by FDA under an Emergency Use Authorization (EUA). This EUA will remain in effect (meaning this test can be used) for the duration of the COVID-19 declaration under Section 564(b)(1) of the Act, 21 U.S.C. section 360bbb-3(b)(1), unless the authorization is terminated or revoked.  Performed at [facility], [address] [place], [place] [address]   Culture, blood (Routine x 2)     Status: None (Preliminary result)   Collection Time: 08/19/22 12:07 PM   Specimen: Right Antecubital; Blood  Result Value Ref Range Status   Specimen Description   Final    RIGHT ANTECUBITAL BOTTLES DRAWN AEROBIC AND ANAEROBIC Performed at [facility], [address], [place], [place] [address]    Special Requests   Final    Blood Culture adequate volume Performed at [facility], [address], [place], [place] [address]    Culture  Setup Time   Final    GRAM POSITIVE COCCI Gram Stain Report Called to,Read Back By and Verified With: M. McClelland @ [id number] BY STEPHTR 08/20/22 AEROBIC BOTTLE ONLY CRITICAL VALUE NOTED.  VALUE IS CONSISTENT WITH PREVIOUSLY REPORTED AND CALLED VALUE. Performed at [facility], [place] [address], [place], [place] [address]    Culture GRAM POSITIVE COCCI  Final   Report Status PENDING  Incomplete  Culture, blood (Routine x 2)     Status: None (Preliminary result)   Collection Time: 08/19/22 12:08 PM   Specimen: Left Antecubital; Blood   Result Value Ref Range Status   Specimen Description   Final    LEFT ANTECUBITAL BOTTLES DRAWN AEROBIC AND ANAEROBIC Performed at [facility], [address] [place], [place] [address]    Special Requests   Final    Blood Culture adequate volume Performed at [facility], [address], [place],  [address]    Culture  Setup Time   Final    GRAM POSITIVE COCCI Gram Stain Report Called to,Read Back By and Verified With: M. FAULK @ 0812 BY STEPHTR 08/20/22 AEROBIC BOTTLE ONLY CRITICAL RESULT CALLED TO, READ BACK BY AND VERIFIED WITH: Cloyde Reams [id number] AT 1207 BY CM Performed at [facility], [place] [address], [place],  [address]    Culture GRAM POSITIVE COCCI  Final   Report Status PENDING  Incomplete  Blood Culture ID Panel (Reflexed)     Status: Abnormal   Collection Time: 08/19/22 12:08 PM  Result Value Ref Range Status   Enterococcus faecalis DETECTED (A) NOT DETECTED Final    Comment: CRITICAL RESULT CALLED TO, READ BACK BY AND VERIFIED WITH: PHARMD S HURTH [id number] AT 1207 BY CM    Enterococcus Faecium NOT DETECTED NOT DETECTED Final   Listeria monocytogenes NOT DETECTED NOT DETECTED Final   Staphylococcus species NOT DETECTED NOT DETECTED Final   Staphylococcus aureus (BCID) NOT DETECTED NOT DETECTED Final   Staphylococcus epidermidis NOT DETECTED NOT DETECTED Final   Staphylococcus lugdunensis NOT DETECTED NOT DETECTED Final   Streptococcus species NOT DETECTED NOT DETECTED Final   Streptococcus agalactiae NOT DETECTED NOT DETECTED Final   Streptococcus pneumoniae NOT DETECTED NOT DETECTED Final   Streptococcus pyogenes NOT DETECTED NOT DETECTED Final   A.calcoaceticus-baumannii  NOT DETECTED NOT DETECTED Final   Bacteroides fragilis NOT DETECTED NOT DETECTED Final   Enterobacterales NOT DETECTED NOT DETECTED Final   Enterobacter cloacae complex NOT DETECTED NOT DETECTED Final   Escherichia coli NOT DETECTED NOT DETECTED Final   Klebsiella aerogenes  NOT DETECTED NOT DETECTED Final   Klebsiella oxytoca NOT DETECTED NOT DETECTED Final   Klebsiella pneumoniae NOT DETECTED NOT DETECTED Final   Proteus species NOT DETECTED NOT DETECTED Final   Salmonella species NOT DETECTED NOT DETECTED Final   Serratia marcescens NOT DETECTED NOT DETECTED Final   Haemophilus influenzae NOT DETECTED NOT DETECTED Final   Neisseria meningitidis NOT DETECTED NOT DETECTED Final   Pseudomonas aeruginosa NOT DETECTED NOT DETECTED Final   Stenotrophomonas maltophilia NOT DETECTED NOT DETECTED Final   Candida albicans NOT DETECTED NOT DETECTED Final   Candida auris NOT DETECTED NOT DETECTED Final   Candida glabrata NOT DETECTED NOT DETECTED Final   Candida krusei NOT DETECTED NOT DETECTED Final   Candida parapsilosis NOT DETECTED NOT DETECTED Final   Candida tropicalis NOT DETECTED NOT DETECTED Final   Cryptococcus neoformans/gattii NOT DETECTED NOT DETECTED Final   Vancomycin resistance NOT DETECTED NOT DETECTED Final    Comment: Performed at [facility], [address] [place], [place] [address]     Serology:    Imaging: If present, new imagings (plain films, ct scans, and mri) have been personally visualized and interpreted; radiology reports have been reviewed. Decision making incorporated into the Impression / Recommendations.  9/3 ct abd pelv 1. Distended gallbladder with gallstones. Patient motion artifact through the gallbladder limits assessment for pericholecystic inflammation. Consider right upper quadrant ultrasound for further evaluation as clinically indicated. 2. Moderate volume of colonic stool with stool distending the rectum, suggesting constipation. 3. Elongated urinary bladder which extends to the level of the umbilicus, can be seen with urinary retention. No bladder wall thickening. 4. Prominent left inguinal node is slightly more prominent than on prior exam, likely reactive. 5. Additional chronic findings as  described.  9/4 liver u/s Cholelithiasis is noted with severe gallbladder wall thickening and pericholecystic fluid highly concerning for acute cholecystitis. These results will be called to the ordering clinician or representative by the Radiologist Assistant, and communication documented in the PACS or zVision Dashboard.  Jabier Mutton, [place] [facility] for Infect[facility] [place] [phone] pager    08/20/2022, 1:07 PM

## 2022-08-20 NOTE — Progress Notes (Addendum)
PROGRESS NOTE   Christopher [name]  ZOX:[id number] DOB: 09/26/1946 DOA: 08/19/2022 PCP: [facility]   Chief Complaint  Patient presents with   Emesis   Fever   Level of care: Stepdown  Brief Admission History:  76 y.o. male with medical history significant for gastric ulcer, coronary artery disease, BPH, atrial fibrillation on chronic anticoagulation. Patient presented to the ED with complaints of bleeding from his urethra today after self cath.  Patient had a UroLift procedure- 08/01/22 by Dr. [name].  Since then he has been having to self cath every other day, on the days he does not self cath, he reports normal urine.  Reports 2 days ago, he was having difficulty inserting the Foley catheter and he had pain but he was able to pass a true.  Yesterday he had he believes normal amount of urine, without blood.  Today he had a little bit of pain again, when he was self cathing, but subsequently started seeing blood, with clots.  He reports he tried milking his penis, get out the blood, and suddenly a jet of blood came out from his penis onto the wall with blood clots.  He reports some pain in his lower abdomen prior to this, but after the blood came out, he had relief. Reports 2 episodes of vomiting today, he otherwise denies difficulty breathing, no cough, no diarrhea.,  No pain or redness of his extremities. He is on Eliquis and has been compliant, his last dose of Eliquis was this morning   ED Course: Febrile to 103.  Heart rate 57-105.  Respirate rate 15-31.  Blood pressure systolic 81-191.  Lactic acidosis 4.9 > 3.2.  WBC 9.8.  UA with small leukocytes blood, 21-50 WBCs. 2.25 L sepsis fluid bolus given, maintenance of LR at 150.  Portable chest x-ray unremarkable.  Renal ultrasound could not be obtained so CT was ordered   CT abdomen and pelvis with contrast-distended gallbladder with gallstones, RUQ ultrasound recommended.  Moderate volume of stool, elongated urinary  bladder which extends to the level of the umbilicus, seen with urinary retention, no bladder wall thickening.   Cefepime vancomycin and metronidazole started.   Assessment and Plan: * Severe sepsis (HCC) Febrile to 103.   With significant lactic acidosis 4.9 > 3.2.  Likely urinary source versus gallbladder. -continue supportive measures -Follow-up blood and urine cultures -Trend lactic acid  Cholecystitis, acute -- discussed with surgery -- last dose of apixaban was 9/3 in AM -- clear liquid diet for now -- check LFTs -- continue broad spectrum antibiotics coverage -- check TTE for preoperative evaluation -- requesting cardiology consult for preoperative evaluation  Bacteremia -- continue IV antibiotics and follow cultures  -- continue supportive measures  Bradycardia -- HR as low as 30 up to mid 40s -- pt asymptomatic -- we have held amiodarone as a result of the bradycardia -- checking TTE and requesting cardiology consult  Hypomagnesemia Mag- 1.5. QTc prolonged at 503. He is on amiodarone but being held. - Replete  Bladder outlet obstruction CT abdomen and pelvis with contrast - elongated urinary bladder which extends to the level of the umbilicus, can be seen with urinary retention. - Urology consultation appreciated -Inserted [place] foley catheter -Resume silodosin  Urinary tract infection UA suggestive with leukocytes, 21-50 WBCs.  Patient has been self cathing every other day.  He is status post UroLift procedure 8/16 by Dr. [name].  He had done well after this procedure until 2 days ago when he had  difficulty and pain during self cath, and today gross hematuria with clots. -Recent urine cultures-pansensitive Enterococcus faecalis and Klebsiella oxytoca -CT abdomen and pelvis-showing distended urinary bladder suggesting urinary retention, distended gallbladder with stones -Mild liver enzyme elevation, AST 65, ALT 69, will obtain follow-up RUQ ultrasound -IV  ceftriaxone 2 g daily -Follow-up urine cultures  Atrial fibrillation, chronic (HCC) Heart rate has been very low down to 30-45 range. On Eliquis, and he believes he is on amiodarone. -Hold Eliquis -Hold amiodarone  Coronary artery disease involving native coronary artery of native heart with angina pectoris [facility]) --requesting cardiology consult for preoperative evaluation  DVT prophylaxis: SCDs Code Status: Full  Family Communication: wife, daughter updated 9/4 Disposition: Status is: Inpatient Remains inpatient appropriate because: IV antibiotics, IV fluids,    Consultants:  Surgery Urology cardiology Procedures:   Antimicrobials:  Ceftriaxone 9/3>> Metronidazole 9/3>> Vancomycin 9/3-9/4  Subjective: Awake, alert, cooperative, NAD  Objective: Vitals:   08/21/22 0300 08/21/22 0400 08/21/22 0500 08/21/22 0600  BP: (!) 123/45 128/88 (!) 116/36 (!) 113/42  Pulse: (!) 59 (!) 58 (!) 55 (!) 52  Resp: 20 (!) 27 13 (!) 25  Temp:  98 F (36.7 C)    TempSrc:  Oral    SpO2: 91% 95% 94% 92%  Weight:   75.2 kg   Height:        Intake/Output Summary (Last 24 hours) at 08/21/2022 0848 Last data filed at 08/20/2022 1835 Gross per 24 hour  Intake 591.24 ml  Output 650 ml  Net -58.76 ml   Filed Weights   08/20/22 0626 08/20/22 0802 08/21/22 0500  Weight: 74.8 kg 75.3 kg 75.2 kg   Examination:  General exam: Appears calm and comfortable, no apparent distress.   Respiratory system: mild tachypnea, no rales heard, no wheezing. Respiratory effort normal. Cardiovascular system: bradycardic rate, normal S1 & S2 heard.  No pedal edema. Gastrointestinal system: Abdomen is nondistended, soft and tender RUQ. No organomegaly or masses felt. Normal bowel sounds heard. Central nervous system: Alert and oriented. No focal neurological deficits. Extremities: Symmetric 5 x 5 power. Skin: No rashes, lesions or ulcers. Psychiatry: Judgement and insight appear normal. Mood & affect  appropriate.   Data Reviewed: I have personally reviewed following labs and imaging studies  CBC: Recent Labs  Lab 08/19/22 1208 08/20/22 0453 08/21/22 0442  WBC 9.8 11.8* 7.6  NEUTROABS 8.6*  --  5.7  HGB 13.6 10.6* 10.3*  HCT 42.6 33.0* 32.2*  MCV 92.2 91.9 92.0  PLT 232 150 130*    Basic Metabolic Panel: Recent Labs  Lab 08/19/22 1208 08/19/22 1212 08/20/22 0453 08/21/22 0442  NA 140  --  138 140  K 3.9  --  4.0 3.5  CL 107  --  111 111  CO2 23  --  24 25  GLUCOSE 100*  --  125* 121*  BUN 12  --  17 12  CREATININE 1.07  --  1.28* 0.78  CALCIUM 9.0  --  8.0* 8.1*  MG  --  1.5* 1.9 1.9    CBG: No results for input(s): "GLUCAP" in the last 168 hours.  Recent Results (from the past 240 hour(s))  Resp Panel by RT-PCR (Flu A&B, Covid) Anterior Nasal Swab     Status: None   Collection Time: 08/19/22 12:02 PM   Specimen: Anterior Nasal Swab  Result Value Ref Range Status   SARS Coronavirus 2 by RT PCR NEGATIVE NEGATIVE Final    Comment: (NOTE) SARS-CoV-2 target nucleic acids are  NOT DETECTED.  The SARS-CoV-2 RNA is generally detectable in upper respiratory specimens during the acute phase of infection. The lowest concentration of SARS-CoV-2 viral copies this assay can detect is 138 copies/mL. A negative result does not preclude SARS-Cov-2 infection and should not be used as the sole basis for treatment or other patient management decisions. A negative result may occur with  improper specimen collection/handling, submission of specimen other than nasopharyngeal swab, presence of viral mutation(s) within the areas targeted by this assay, and inadequate number of viral copies(<138 copies/mL). A negative result must be combined with clinical observations, patient history, and epidemiological information. The expected result is Negative.  Fact Sheet for Patients:  EntrepreneurPulse.com.au  Fact Sheet for Healthcare Providers:   IncredibleEmployment.be  This test is no t yet approved or cleared by the [place] FDA and  has been authorized for detection and/or diagnosis of SARS-CoV-2 by FDA under an Emergency Use Authorization (EUA). This EUA will remain  in effect (meaning this test can be used) for the duration of the COVID-19 declaration under Section 564(b)(1) of the Act, 21 U.S.C.section 360bbb-3(b)(1), unless the authorization is terminated  or revoked sooner.       Influenza A by PCR NEGATIVE NEGATIVE Final   Influenza B by PCR NEGATIVE NEGATIVE Final    Comment: (NOTE) The Xpert Xpress SARS-CoV-2/FLU/RSV plus assay is intended as an aid in the diagnosis of influenza from Nasopharyngeal swab specimens and should not be used as a sole basis for treatment. Nasal washings and aspirates are unacceptable for Xpert Xpress SARS-CoV-2/FLU/RSV testing.  Fact Sheet for Patients: EntrepreneurPulse.com.au  Fact Sheet for Healthcare Providers: IncredibleEmployment.be  This test is not yet approved or cleared by the [place] FDA and has been authorized for detection and/or diagnosis of SARS-CoV-2 by FDA under an Emergency Use Authorization (EUA). This EUA will remain in effect (meaning this test can be used) for the duration of the COVID-19 declaration under Section 564(b)(1) of the Act, 21 U.S.C. section 360bbb-3(b)(1), unless the authorization is terminated or revoked.  Performed at [facility], [address], [place], [place] [address]   Culture, blood (Routine x 2)     Status: None (Preliminary result)   Collection Time: 08/19/22 12:07 PM   Specimen: Right Antecubital; Blood  Result Value Ref Range Status   Specimen Description   Final    RIGHT ANTECUBITAL BOTTLES DRAWN AEROBIC AND ANAEROBIC Performed at [facility], [address], [place], [place] [address]    Special Requests   Final    Blood Culture adequate volume Performed at  [facility], [address] [place], [place] [address]    Culture  Setup Time   Final    GRAM POSITIVE COCCI Gram Stain Report Called to,Read Back By and Verified With: M. [place] @ 8101 BY STEPHTR 08/20/22 AEROBIC BOTTLE ONLY CRITICAL VALUE NOTED.  VALUE IS CONSISTENT WITH PREVIOUSLY REPORTED AND CALLED VALUE. Performed at [facility], [place], [place], [place] [address]    Culture GRAM POSITIVE COCCI  Final   Report Status PENDING  Incomplete  Culture, blood (Routine x 2)     Status: None (Preliminary result)   Collection Time: 08/19/22 12:08 PM   Specimen: Left Antecubital; Blood  Result Value Ref Range Status   Specimen Description   Final    LEFT ANTECUBITAL BOTTLES DRAWN AEROBIC AND ANAEROBIC Performed at [facility], [address], [place], [place] [address]    Special Requests   Final  Blood Culture adequate volume Performed at [facility], [address], [place], [place] [address]    Culture  Setup Time   Final    GRAM POSITIVE COCCI Gram Stain Report Called to,Read Back By and Verified With: M. [place] @ 0812 BY STEPHTR 08/20/22 AEROBIC BOTTLE ONLY CRITICAL RESULT CALLED TO, READ BACK BY AND VERIFIED WITH: PHARMD S [place] [id number] AT 1207 BY CM GRAM POSITIVE COCCI ANAEROBIC BOTTLE ONLY CRITICAL VALUE NOTED.  VALUE IS CONSISTENT WITH PREVIOUSLY REPORTED AND CALLED VALUE. Performed at [facility], [address], [place], [place] [address]    Culture [place]  Final   Report Status PENDING  Incomplete  Blood Culture ID Panel (Reflexed)     Status: Abnormal   Collection Time: 08/19/22 12:08 PM  Result Value Ref Range Status   Enterococcus faecalis DETECTED (A) NOT DETECTED Final    Comment: CRITICAL RESULT CALLED TO, READ BACK BY AND VERIFIED WITH: Cloyde Reams [id number] AT 1207 BY CM    Enterococcus Faecium NOT DETECTED NOT DETECTED Final   Listeria monocytogenes NOT DETECTED NOT DETECTED Final   Staphylococcus species NOT DETECTED NOT DETECTED  Final   Staphylococcus aureus (BCID) NOT DETECTED NOT DETECTED Final   Staphylococcus epidermidis NOT DETECTED NOT DETECTED Final   Staphylococcus lugdunensis NOT DETECTED NOT DETECTED Final   Streptococcus species NOT DETECTED NOT DETECTED Final   Streptococcus agalactiae NOT DETECTED NOT DETECTED Final   Streptococcus pneumoniae NOT DETECTED NOT DETECTED Final   Streptococcus pyogenes NOT DETECTED NOT DETECTED Final   A.calcoaceticus-baumannii NOT DETECTED NOT DETECTED Final   Bacteroides fragilis NOT DETECTED NOT DETECTED Final   Enterobacterales NOT DETECTED NOT DETECTED Final   Enterobacter cloacae complex NOT DETECTED NOT DETECTED Final   Escherichia coli NOT DETECTED NOT DETECTED Final   Klebsiella aerogenes NOT DETECTED NOT DETECTED Final   Klebsiella oxytoca NOT DETECTED NOT DETECTED Final   Klebsiella pneumoniae NOT DETECTED NOT DETECTED Final   Proteus species NOT DETECTED NOT DETECTED Final   Salmonella species NOT DETECTED NOT DETECTED Final   Serratia marcescens NOT DETECTED NOT DETECTED Final   Haemophilus influenzae NOT DETECTED NOT DETECTED Final   Neisseria meningitidis NOT DETECTED NOT DETECTED Final   Pseudomonas aeruginosa NOT DETECTED NOT DETECTED Final   Stenotrophomonas maltophilia NOT DETECTED NOT DETECTED Final   Candida albicans NOT DETECTED NOT DETECTED Final   Candida auris NOT DETECTED NOT DETECTED Final   Candida glabrata NOT DETECTED NOT DETECTED Final   Candida krusei NOT DETECTED NOT DETECTED Final   Candida parapsilosis NOT DETECTED NOT DETECTED Final   Candida tropicalis NOT DETECTED NOT DETECTED Final   Cryptococcus neoformans/gattii NOT DETECTED NOT DETECTED Final   Vancomycin resistance NOT DETECTED NOT DETECTED Final    Comment: Performed at [facility], [address], [place], [place] [address]  Culture, blood (Routine X 2) w Reflex to ID Panel     Status: None (Preliminary result)   Collection Time: 08/20/22  3:54 PM   Specimen:  BLOOD LEFT HAND  Result Value Ref Range Status   Specimen Description   Final    BLOOD LEFT HAND BOTTLES DRAWN AEROBIC AND ANAEROBIC   Special Requests   Final    Blood Culture adequate volume Performed at [facility], [address] [place], [place] [address]    Culture PENDING  Incomplete   Report Status PENDING  Incomplete  Culture, blood (Routine X 2) w Reflex to ID Panel     Status:  None (Preliminary result)   Collection Time: 08/20/22  3:54 PM   Specimen: BLOOD RIGHT HAND  Result Value Ref Range Status   Specimen Description   Final    BLOOD RIGHT HAND BOTTLES DRAWN AEROBIC AND ANAEROBIC   Special Requests   Final    Blood Culture adequate volume Performed at [facility], [address], [place], [place] [address]    Culture PENDING  Incomplete   Report Status PENDING  Incomplete  MRSA Next Gen by PCR, Nasal     Status: None   Collection Time: 08/20/22  4:46 PM   Specimen: Nasal Mucosa; Nasal Swab  Result Value Ref Range Status   MRSA by PCR Next Gen NOT DETECTED NOT DETECTED Final    Comment: (NOTE) The GeneXpert MRSA Assay (FDA approved for NASAL specimens only), is one component of a comprehensive MRSA colonization surveillance program. It is not intended to diagnose MRSA infection nor to guide or monitor treatment for MRSA infections. Test performance is not FDA approved in patients less than 61 years old. Performed at [facility], [address], [place],  [address]      Radiology Studies: US Abdomen Limited RUQ (LIVER/GB)  Result Date: 08/20/2022 CLINICAL DATA:  Cholelithiasis. EXAM: ULTRASOUND ABDOMEN LIMITED RIGHT UPPER QUADRANT COMPARISON:  CT scan of August 19, 2022. Ultrasound of June 26, 2022. FINDINGS: Gallbladder: Cholelithiasis is noted with severe gallbladder wall thickening measuring 13 mm. No sonographic Murphy's sign is noted. Moderate amount of pericholecystic fluid is noted. Common bile duct: Diameter: 4 mm which is within normal limits.  Liver: No focal lesion identified. Within normal limits in parenchymal echogenicity. Portal vein is patent on color Doppler imaging with normal direction of blood flow towards the liver. Other: Minimal ascites is noted. IMPRESSION: Cholelithiasis is noted with severe gallbladder wall thickening and pericholecystic fluid highly concerning for acute cholecystitis. These results will be called to the ordering clinician or representative by the Radiologist Assistant, and communication documented in the PACS or zVision Dashboard. Electronically Signed   By: Marijo Conception M.D.   On: 08/20/2022 10:48   DG CHEST PORT 1 VIEW  Result Date: 08/20/2022 CLINICAL DATA:  Difficulty breathing.  Hypoxia. EXAM: PORTABLE CHEST 1 VIEW COMPARISON:  August 19, 2022. FINDINGS: Stable cardiomegaly. Sternotomy wires are noted. Mild bibasilar subsegmental atelectasis is noted. Bony thorax is unremarkable. IMPRESSION: Mild bibasilar subsegmental atelectasis. Electronically Signed   By: Marijo Conception M.D.   On: 08/20/2022 09:53   CT ABDOMEN PELVIS W CONTRAST  Result Date: 08/19/2022 CLINICAL DATA:  Sepsis. Vomiting. [age] year. Patient reports surgery 1 month ago on penis. EXAM: CT ABDOMEN AND PELVIS WITH CONTRAST TECHNIQUE: Multidetector CT imaging of the abdomen and pelvis was performed using the standard protocol following bolus administration of intravenous contrast. RADIATION DOSE REDUCTION: This exam was performed according to the departmental dose-optimization program which includes automated exposure control, adjustment of the mA and/or kV according to patient size and/or use of iterative reconstruction technique. CONTRAST:  169m OMNIPAQUE IOHEXOL 350 MG/ML SOLN COMPARISON:  Abdominopelvic CT 02/06/2022 FINDINGS: Lower chest: Dependent atelectasis with trace bilateral pleural thickening, no significant effusion. Cardiomegaly with coronary artery calcifications. Prior median sternotomy. Hepatobiliary: There is no focal  hepatic lesion. Gallbladder is distended. Patient motion artifact through the gallbladder limits assessment for pericholecystic inflammation. There is no definite wall thickening. 15 mm rounded high-density in the gallbladder likely represent gallstones, with compared with 06/26/2022 abdominal ultrasound. There is diffuse periportal edema. No biliary dilatation. Pancreas: No  ductal dilatation or inflammation. Spleen: Tiny subcentimeter hypodensity in the lower spleen, nonspecific. Spleen is normal in size. Adrenals/Urinary Tract: Normal adrenal glands. Mild prominence of the right renal collecting system without frank hydronephrosis. Mild thinning of bilateral renal parenchyma calcification in the right renal hilum is felt to be vascular rather than nonobstructing stone. Elongated urinary bladder which extends to the level of the umbilicus. No bladder wall thickening. Stomach/Bowel: No bowel obstruction or inflammatory change. Moderate volume of colonic stool. There is stool distending the rectum. No rectal or colonic wall thickening. Stomach is decompressed. Appendix not visualized. Vascular/Lymphatic: Aortic and branch atherosclerosis. No aortic aneurysm. Patent portal vein. Prominent left inguinal node measures 10 mm short axis. This is slightly more prominent than on prior exam. Reproductive: Surgical clips in the prostate which is prominent size. No evidence of prostatic fluid collection. The included portion of the penis and urethra are unremarkable. Other: No ascites. No free air. Tiny fat containing umbilical hernia. Minimal fat in the left inguinal canal. Musculoskeletal: Postsurgical change in the lumbar spine. Spinal stimulator in place. No focal bone lesions. IMPRESSION: 1. Distended gallbladder with gallstones. Patient motion artifact through the gallbladder limits assessment for pericholecystic inflammation. Consider right upper quadrant ultrasound for further evaluation as clinically indicated. 2.  Moderate volume of colonic stool with stool distending the rectum, suggesting constipation. 3. Elongated urinary bladder which extends to the level of the umbilicus, can be seen with urinary retention. No bladder wall thickening. 4. Prominent left inguinal node is slightly more prominent than on prior exam, likely reactive. 5. Additional chronic findings as described. Aortic Atherosclerosis (ICD10-I70.0). Electronically Signed   By: Keith Rake M.D.   On: 08/19/2022 16:44   DG Chest Port 1 View  Result Date: 08/19/2022 CLINICAL DATA:  Fever.  Recent surgery. EXAM: PORTABLE CHEST 1 VIEW COMPARISON:  01/01/2021. FINDINGS: Stable changes from prior cardiac surgery. No mediastinal or hilar masses. Clear lungs.  No pleural effusion or pneumothorax. Skeletal structures are grossly intact. IMPRESSION: No active disease. Electronically Signed   By: Lajean Manes M.D.   On: 08/19/2022 12:30    Scheduled Meds:  ALPRAZolam  1 mg Oral QHS   atorvastatin  80 mg Oral Daily   Chlorhexidine Gluconate Cloth  6 each Topical Daily   finasteride  5 mg Oral Daily   gabapentin  200 mg Oral TID   lubiprostone  24 mcg Oral BID WC   pantoprazole  40 mg Oral BID   polyethylene glycol  17 g Oral BID   tamsulosin  0.4 mg Oral QPC supper   Continuous Infusions:  ampicillin-sulbactam (UNASYN) IV 3 g (08/21/22 0200)   lactated ringers Stopped (08/20/22 1710)     LOS: 2 days   Critical Care Procedure Note Authorized and Performed by: Murvin Natal MD  Total Critical Care time:  46 mins Due to a high probability of clinically significant, life threatening deterioration, the patient required my highest level of preparedness to intervene emergently and I personally spent this critical care time directly and personally managing the patient.  This critical care time included obtaining a history; examining the patient, pulse oximetry; ordering and review of studies; arranging urgent treatment with development of a management  plan; evaluation of patient's response of treatment; frequent reassessment; and discussions with other providers.  This critical care time was performed to assess and manage the high probability of imminent and life threatening deterioration that could result in multi-organ failure.  It was exclusive of separately billable procedures and  treating other patients and teaching time.    Irwin Brakeman, MD How to contact the [facility] Attending or Consulting provider [place] or covering provider during after hours [place], for this patient?  Check the care team in [facility] and look for a) attending/consulting [facility] provider listed and b) the [facility] team listed Log into [email or web address] and use [place]'s universal password to access. If you do not have the password, please contact the hospital operator. Locate the [facility] provider you are looking for under [facility] and page to a number that you can be directly reached. If you still have difficulty reaching the provider, please page the [facility] (Director on Call) for the Hospitalists listed on amion for assistance.  08/21/2022, 8:48 AM

## 2022-08-20 NOTE — Progress Notes (Signed)
   08/19/22 2338  Vitals  BP (!) 73/58  MAP (mmHg) (!) 63  BP Method Automatic  Pulse Rate 76  MEWS COLOR  MEWS Score Color Yellow  MEWS Score  MEWS Temp 0  MEWS Systolic 2  MEWS Pulse 0  MEWS RR 0  MEWS LOC 0  MEWS Score 2  Provider Notification  Provider Name/Title Asia Zierle-Ghosh, DO  Date Provider Notified 08/19/22  Notification Reason Change in status  Provider response See new orders  Date of Provider Response 08/19/22  Time of Provider Response [phone]

## 2022-08-20 NOTE — Progress Notes (Signed)
   08/20/22 0612  Assess: MEWS Score  Temp (!) 102 F (38.9 C)  BP (!) 98/44  MAP (mmHg) (!) 59  Pulse Rate (!) 49  SpO2 (!) 86 %  O2 Device Room Air  Assess: MEWS Score  MEWS Temp 2  MEWS Systolic 1  MEWS Pulse 1  MEWS RR 0  MEWS LOC 0  MEWS Score 4  MEWS Score Color Red  Treat  MEWS Interventions Escalated (See documentation below);Administered scheduled meds/treatments (Asia Zierle-Ghosh, DO notified)  Pain Scale 0-10  Pain Score 0  Take Vital Signs  Increase Vital Sign Frequency  Red: Q 1hr X 4 then Q 4hr X 4, if remains red, continue Q 4hrs  Escalate  MEWS: Escalate Red: discuss with charge nurse/RN and provider, consider discussing with RRT  Notify: Charge Nurse/RN  Name of Charge Nurse/RN Notified Deeann Dowse, RN  Date Charge Nurse/RN Notified 08/20/22  Time Charge Nurse/RN Notified [id number]  Notify: Provider  Provider Name/Title Carlynn Purl, DO  Date Provider Notified 08/20/22  Time Provider Notified [phone]  Method of Notification Call (secure chat)  Notification Reason Change in status  Provider response See new orders  Date of Provider Response 08/20/22  Assess: SIRS CRITERIA  SIRS Temperature  1  SIRS Pulse 0  SIRS Respirations  0  SIRS WBC 1  SIRS Score Sum  2

## 2022-08-20 NOTE — Assessment & Plan Note (Addendum)
--  no chest pain -personally reviewed EKG--no concerning STT changes

## 2022-08-20 NOTE — Progress Notes (Signed)
Pt has temp of 101.7 and he refuses Tylenol states medication makes his stomach hurt bad.  BP 98/44 and pulse 49. O2 77% on room air, RN placed pt on O2 at 2 lpm via n/c and O2 up to 84% RT notified. [place] Zierle-Ghosh-DO notified with new orders given.

## 2022-08-20 NOTE — Progress Notes (Signed)
   08/20/22 0113  Assess: MEWS Score  Temp 99.6 F (37.6 C)  BP (!) 81/47  MAP (mmHg) (!) 59  Pulse Rate (!) 49  Resp 18  SpO2 92 %  Assess: MEWS Score  MEWS Temp 0  MEWS Systolic 1  MEWS Pulse 1  MEWS RR 0  MEWS LOC 0  MEWS Score 2  MEWS Score Color Yellow  Treat  MEWS Interventions Administered scheduled meds/treatments;Escalated (See documentation below)  Pain Scale 0-10  Pain Score 0  Take Vital Signs  Increase Vital Sign Frequency  Yellow: Q 2hr X 2 then Q 4hr X 2, if remains yellow, continue Q 4hrs  Notify: Provider  Provider Name/Title Carlynn Purl, DO  Date Provider Notified 08/20/22  Notification Reason Change in status  Provider response See new orders  Date of Provider Response 08/20/22  Time of Provider Response 0020  Document  Patient Outcome Stabilized after interventions  Assess: SIRS CRITERIA  SIRS Temperature  0  SIRS Pulse 0  SIRS Respirations  0  SIRS WBC 1  SIRS Score Sum  1   Asia Zierle-Ghosh, DO notified and new orders given and administered.

## 2022-08-21 ENCOUNTER — Inpatient Hospital Stay (HOSPITAL_COMMUNITY): Payer: Medicare Other

## 2022-08-21 DIAGNOSIS — N320 Bladder-neck obstruction: Secondary | ICD-10-CM | POA: Diagnosis not present

## 2022-08-21 DIAGNOSIS — N179 Acute kidney failure, unspecified: Secondary | ICD-10-CM | POA: Diagnosis present

## 2022-08-21 DIAGNOSIS — A419 Sepsis, unspecified organism: Secondary | ICD-10-CM | POA: Diagnosis not present

## 2022-08-21 DIAGNOSIS — Z01810 Encounter for preprocedural cardiovascular examination: Secondary | ICD-10-CM

## 2022-08-21 DIAGNOSIS — I4891 Unspecified atrial fibrillation: Secondary | ICD-10-CM

## 2022-08-21 DIAGNOSIS — G4733 Obstructive sleep apnea (adult) (pediatric): Secondary | ICD-10-CM

## 2022-08-21 DIAGNOSIS — R001 Bradycardia, unspecified: Secondary | ICD-10-CM

## 2022-08-21 DIAGNOSIS — R7881 Bacteremia: Secondary | ICD-10-CM | POA: Diagnosis not present

## 2022-08-21 DIAGNOSIS — I482 Chronic atrial fibrillation, unspecified: Secondary | ICD-10-CM | POA: Diagnosis not present

## 2022-08-21 DIAGNOSIS — I2510 Atherosclerotic heart disease of native coronary artery without angina pectoris: Secondary | ICD-10-CM

## 2022-08-21 LAB — CBC WITH DIFFERENTIAL/PLATELET
Abs Immature Granulocytes: 0.03 10*3/uL (ref 0.00–0.07)
Basophils Absolute: 0 10*3/uL (ref 0.0–0.1)
Basophils Relative: 0 %
Eosinophils Absolute: 0 10*3/uL (ref 0.0–0.5)
Eosinophils Relative: 0 %
HCT: 32.2 % — ABNORMAL LOW (ref 39.0–52.0)
Hemoglobin: 10.3 g/dL — ABNORMAL LOW (ref 13.0–17.0)
Immature Granulocytes: 0 %
Lymphocytes Relative: 14 %
Lymphs Abs: 1 10*3/uL (ref 0.7–4.0)
MCH: 29.4 pg (ref 26.0–34.0)
MCHC: 32 g/dL (ref 30.0–36.0)
MCV: 92 fL (ref 80.0–100.0)
Monocytes Absolute: 0.8 10*3/uL (ref 0.1–1.0)
Monocytes Relative: 10 %
Neutro Abs: 5.7 10*3/uL (ref 1.7–7.7)
Neutrophils Relative %: 76 %
Platelets: 130 10*3/uL — ABNORMAL LOW (ref 150–400)
RBC: 3.5 MIL/uL — ABNORMAL LOW (ref 4.22–5.81)
RDW: 14.6 % (ref 11.5–15.5)
WBC: 7.6 10*3/uL (ref 4.0–10.5)
nRBC: 0 % (ref 0.0–0.2)

## 2022-08-21 LAB — COMPREHENSIVE METABOLIC PANEL
ALT: 79 U/L — ABNORMAL HIGH (ref 0–44)
AST: 71 U/L — ABNORMAL HIGH (ref 15–41)
Albumin: 2.7 g/dL — ABNORMAL LOW (ref 3.5–5.0)
Alkaline Phosphatase: 92 U/L (ref 38–126)
Anion gap: 4 — ABNORMAL LOW (ref 5–15)
BUN: 12 mg/dL (ref 8–23)
CO2: 25 mmol/L (ref 22–32)
Calcium: 8.1 mg/dL — ABNORMAL LOW (ref 8.9–10.3)
Chloride: 111 mmol/L (ref 98–111)
Creatinine, Ser: 0.78 mg/dL (ref 0.61–1.24)
GFR, Estimated: >60 mL/min (ref >60)
Glucose, Bld: 121 mg/dL — ABNORMAL HIGH (ref 70–99)
Potassium: 3.5 mmol/L (ref 3.5–5.1)
Sodium: 140 mmol/L (ref 135–145)
Total Bilirubin: 0.9 mg/dL (ref 0.3–1.2)
Total Protein: 5.2 g/dL — ABNORMAL LOW (ref 6.5–8.1)

## 2022-08-21 LAB — MAGNESIUM: Magnesium: 1.9 mg/dL (ref 1.7–2.4)

## 2022-08-21 LAB — CULTURE, BLOOD (ROUTINE X 2): Special Requests: ADEQUATE

## 2022-08-21 MED ORDER — SINCALIDE 5 MCG IJ SOLR
INTRAMUSCULAR | Status: AC
  Filled 2022-08-21: qty 5

## 2022-08-21 MED ORDER — STERILE WATER FOR INJECTION IJ SOLN
INTRAMUSCULAR | Status: AC
  Filled 2022-08-21: qty 10

## 2022-08-21 MED ORDER — TECHNETIUM TC 99M MEBROFENIN IV KIT
5.0000 | PACK | Freq: Once | INTRAVENOUS | Status: AC | PRN
  Administered 2022-08-21: 5.5 via INTRAVENOUS

## 2022-08-21 MED ORDER — AMIODARONE HCL 200 MG PO TABS
200.0000 mg | ORAL_TABLET | Freq: Every day | ORAL | Status: DC
  Administered 2022-08-21 – 2022-08-24 (×4): 200 mg via ORAL
  Filled 2022-08-21 (×4): qty 1

## 2022-08-21 NOTE — Consult Note (Signed)
Cardiology Consultation   Patient ID: Christopher [name] MRN: [id number]; DOB: 1946-12-04   Admission date: 08/19/2022  PCP:  [facility]ll[facility], [place] [facility] Cardiologist:  Jenkins Rouge, MD  Electrophysiologist:  Cristopher Peru, MD       Chief Complaint:  Preoperative risk management  Patient Profile:   Christopher [name] is a 76 y.o. male with CAD (CABG 2020 LIMA to LAD, SVG to PDA-OM) & HLD, PAF (SR in May of this year, s/p LAA exclusion with CABG; no f/u TEE or f/u imaging) on chronic amiodaorne and with Qtc prolongation, OSA intolerant to CPAP, HTN  who is being seen 08/21/2022 for the evaluation of preoperative risk stratification.  History of Present Illness:   Christopher [name] has been doing well and was able to tolerate a UroLift Procedure 08/01/22.  Since then he has reported a need to self cath every other day.    Two days ago he was having pain with self cath.  Start to have blood then blood clots.  Complicated by nausea and vomiting.  No fevers, chills night sweats.  Came in due to bleeding bout found to have sepsis with lacate 4.9; tachypnea, and fever to 103.  Started on high dose antibiotics.  But found to have enterococcus bacteremia.  Also found to have cholecystitis.    Today patient notes that on admission HR was 140.  No symptoms.  No chest pain.  He has new RUQ pain.  No urinate symptoms this AM.  No SOB.  No palpitations.  Swallowing issues with capsules only.  Past Medical History:  Diagnosis Date   A-fib [facility])    Arthritis    "all over my body" (06/17/2017)   BPH (benign prostatic hyperplasia)    takes Proscar daily   CAD (coronary artery disease)    a. s/p CABG in 09/2019 with SVG-PDA-OM and LIMA-LAD with LAA excision   Chronic back pain    DDD; "upper and lower" (06/17/2017)   Constipation    takes Colace daily   Coronary artery disease    DDD (degenerative disc disease), cervical    DVT (deep venous thrombosis) (HCC)    left peroneal  vein 07/2017 (in setting of recent left TKA)   Dysrhythmia    history of Atrial fibrillation, no long has this   Enlarged prostate    GERD (gastroesophageal reflux disease)    takes Nexium daily   H/O hiatal hernia    History of colon polyps    benign   History of gastric ulcer    Insomnia    takes Xanax nightly   Joint capsule tear    left knee   Joint pain    Joint swelling    Macular degeneration    Muscle spasm of back    takes Valium daily as needed   PAF (paroxysmal atrial fibrillation) (HCC)    Peripheral neuropathy    takes Gabapentin daily   PONV (postoperative nausea and vomiting)    Pre-diabetes    Sleep apnea    pt does not wear a cpap   Urinary hesitancy    Weakness    numbness and tingling    Past Surgical History:  Procedure Laterality Date   ANTERIOR CERVICAL DECOMP/DISCECTOMY FUSION  ~ 1991   "took bone from hip"   ANTERIOR CERVICAL DECOMP/DISCECTOMY FUSION  X 2   "put plate in"   ANTERIOR CERVICAL DISCECTOMY  <11/2005    5-6 and 4-5./notes 05/02/2011  APPENDECTOMY     BACK SURGERY     BIOPSY  05/30/2018   Procedure: BIOPSY;  Surgeon: [name], MD;  [facility];  Service: Endoscopy;;  esophagus   BIOPSY  01/09/2022   Procedure: BIOPSY;  Surgeon: Harvel Quale, MD;  [facility];  Service: Gastroenterology;;   CARDIAC CATHETERIZATION     3-4 per pt   CARPAL TUNNEL RELEASE Bilateral 10/2005 - 11/2005   right-left /notes 05/02/2011   CATARACT EXTRACTION W/PHACO Right 05/24/2014   Procedure: CATARACT EXTRACTION PHACO AND INTRAOCULAR LENS PLACEMENT ([place]);  Surgeon: [name], MD;  Location: [facility];  Service: Ophthalmology;  Laterality: Right;  CDE 6.74   CATARACT EXTRACTION W/PHACO Left 06/17/2014   Procedure: CATARACT EXTRACTION PHACO AND INTRAOCULAR LENS PLACEMENT ([facility]);  Surgeon: [name], MD;  Location: [facility];  Service: Ophthalmology;  Laterality: Left;  CDE:4.65   COLONOSCOPY WITH  ESOPHAGOGASTRODUODENOSCOPY (EGD) N/A 01/22/2013   Procedure: COLONOSCOPY WITH ESOPHAGOGASTRODUODENOSCOPY (EGD);  Surgeon: [name], MD;  [facility];  Service: Endoscopy;  Laterality: N/A;  140   COLONOSCOPY WITH PROPOFOL N/A 05/30/2018   diverticulosis and external hemorrhoids   CORONARY ARTERY BYPASS GRAFT  2020   CYSTOSCOPY WITH INSERTION OF UROLIFT N/A 04/16/2022   Procedure: CYSTOSCOPY WITH INSERTION OF UROLIFT;  Surgeon: Cleon Gustin, MD;  [facility]: [facility];  Service: Urology;  Laterality: N/A;   ESOPHAGOGASTRODUODENOSCOPY (EGD) WITH PROPOFOL N/A 05/30/2018   normal esophagus, presence of possible barretts (bx consistent with reflux changes, GAVE)   ESOPHAGOGASTRODUODENOSCOPY (EGD) WITH PROPOFOL N/A 01/09/2022   Procedure: ESOPHAGOGASTRODUODENOSCOPY (EGD) WITH PROPOFOL;  Surgeon: Harvel Quale, MD;  [facility];  Service: Gastroenterology;  Laterality: N/A;  305, moved up per Leigh Amm   EYE SURGERY Bilateral    "cleaned film w/laser; since my 1st cataract OR"   JOINT REPLACEMENT     LEFT HEART CATH AND CORONARY ANGIOGRAPHY N/A 02/16/2019   Procedure: LEFT HEART CATH AND CORONARY ANGIOGRAPHY;  Surgeon: Belva Crome, MD;  Location: [place] [facility];  Service: Cardiovascular;  Laterality: N/A;   POSTERIOR LUMBAR FUSION     "put a plate in"   QUADRICEPS TENDON REPAIR Left 07/01/2017   QUADRICEPS TENDON REPAIR Left 07/01/2017   Procedure: REPAIR QUADRICEP TENDON;  Surgeon: [name], MD;  Location: [place];  Service: Orthopedics;  Laterality: Left;   SHOULDER ARTHROSCOPY Bilateral    SPINAL CORD STIMULATOR INSERTION N/A 09/23/2015   Procedure: LUMBAR SPINAL CORD STIMULATOR INSERTION;  Surgeon: [name], MD;  Location: [place] [facility];  Service: Neurosurgery;  Laterality: N/A;  LUMBAR SPINAL CORD STIMULATOR INSERTION   SPINAL CORD STIMULATOR INSERTION N/A 04/25/2022   Procedure: REVISION OF  SPINAL CORD STIMULATOR;  Surgeon: Newman Pies, MD;  Location: [place];  Service: Neurosurgery;  Laterality: N/A;   TOTAL KNEE ARTHROPLASTY Left 06/17/2017   TOTAL KNEE ARTHROPLASTY Left 06/17/2017   Procedure: TOTAL KNEE ARTHROPLASTY;  Surgeon: [name], MD;  Location: [place];  Service: Orthopedics;  Laterality: Left;   TOTAL KNEE ARTHROPLASTY Right 12/16/2017   Procedure: TOTAL KNEE ARTHROPLASTY;  Surgeon: [name], MD;  Location: [place];  Service: Orthopedics;  Laterality: Right;     Medications Prior to Admission: Prior to Admission medications   Medication Sig Start Date End Date Taking? Authorizing Provider  ALPRAZolam Duanne Moron) 1 MG tablet Take 1 mg by mouth at bedtime. 09/27/17  Yes [provider]  amiodarone (PACERONE) 200 MG tablet TAKE 1  TABLET BY MOUTH EVERY DAY Patient taking differently: Take 200 mg by mouth daily. 04/10/22  Yes Evans Lance, MD  apixaban (ELIQUIS) 5 MG TABS tablet Take 1 tablet (5 mg total) by mouth 2 (two) times daily. *Restart 04/28/2022* Patient taking differently: Take 5 mg by mouth 2 (two) times daily. 04/25/22  Yes Viona Gilmore D, NP  aspirin EC 81 MG tablet Take 1 tablet (81 mg total) by mouth daily. 02/11/19  Yes Weaver, Scott T, PA-C  atorvastatin (LIPITOR) 80 MG tablet Take 1 tablet (80 mg total) by mouth daily. 04/04/22  Yes Strader, [place] M, PA-C  Calcium Polycarbophil (FIBER-CAPS PO) Take 2 capsules by mouth daily.   Yes [provider]  Cholecalciferol (VITAMIN D3) 10 MCG (400 UNIT) CAPS Take 400 Units by mouth daily.   Yes [provider]  Cyanocobalamin (B-12 PO) Take 1 tablet by mouth daily.   Yes [provider]  docusate sodium (COLACE) 250 MG capsule Take 250 mg by mouth daily.   Yes [provider]  finasteride (PROSCAR) 5 MG tablet TAKE 1 TABLET (5 MG TOTAL) BY MOUTH DAILY. 08/09/22  Yes McKenzie, Candee Furbish, MD  furosemide (LASIX) 20 MG tablet Take 1 tablet (20 mg total) by mouth daily. 01/02/21  Yes Belva Crome, MD   gabapentin (NEURONTIN) 400 MG capsule One three times a day Patient taking differently: Take 400 mg by mouth 3 (three) times daily. 01/05/19  Yes Tanda Rockers, MD  lubiprostone (AMITIZA) 24 MCG capsule Take 1 capsule (24 mcg total) by mouth 2 (two) times daily with a meal. 05/16/22  Yes Montez Morita, Quillian Quince, MD  nitroGLYCERIN (NITROSTAT) 0.4 MG SL tablet Place 1 tablet (0.4 mg total) under the tongue every 5 (five) minutes as needed for chest pain. 08/26/19  Yes Belva Crome, MD  Omega-3 1000 MG CAPS Take 1,000 mg by mouth daily.   Yes [provider]  pantoprazole (PROTONIX) 40 MG tablet TAKE 1 TABLET BY MOUTH TWICE A DAY (30 TO 60 MINUTES BEFORE 1ST AND LAST MEALS OF THE DAY) Patient taking differently: 40 mg 2 (two) times daily. (30 TO 60 MINUTES BEFORE 1ST AND LAST MEALS OF THE DAY) 10/20/19  Yes Tanda Rockers, MD  potassium chloride (KLOR-CON) 10 MEQ tablet Take 10 mEq by mouth daily. 09/29/19  Yes [provider]  Propylene Glycol (SYSTANE BALANCE) 0.6 % SOLN Place 1 drop into both eyes daily as needed (dry eyes).   Yes [provider]  silodosin (RAPAFLO) 8 MG CAPS capsule Take 1 capsule (8 mg total) by mouth at bedtime. 06/20/22  Yes McKenzie, Candee Furbish, MD  Simethicone 180 MG CAPS Take 1 capsule (180 mg total) by mouth 3 (three) times daily as needed. Patient taking differently: Take 180 mg by mouth 3 (three) times daily as needed (bloating). 08/18/19  Yes Rehman, Mechele Dawley, MD  traMADol (ULTRAM) 50 MG tablet Take 1-2 tablets (50-100 mg total) by mouth every 4 (four) hours as needed. 04/25/22  Yes Viona Gilmore D, NP  vitamin E 200 UNIT capsule Take 200 Units by mouth daily.   Yes [provider]  Zinc Sulfate (ZINC 15 PO) Take 15 mg by mouth daily.   Yes [provider]  nitrofurantoin, macrocrystal-monohydrate, (MACROBID) 100 MG capsule Take 1 capsule (100 mg total) by mouth every 12 (twelve) hours. Patient not taking: Reported on 08/19/2022  06/29/22   Summerlin, Berneice Heinrich, PA-C  sulfamethoxazole-trimethoprim (BACTRIM DS) 800-160 MG tablet Take 1 tablet by  mouth every 12 (twelve) hours. 06/27/22   McKenzie, Candee Furbish, MD     Allergies:    Allergies  Allergen Reactions   Tylenol [Acetaminophen] Other (See Comments)    Stomach pain    Morphine And Related Nausea And Vomiting   Propofol Nausea And Vomiting    Social History:   Social History   Socioeconomic History   Marital status: Divorced    Spouse name: Not on file   Number of children: 3   Years of education: Not on file   Highest education level: Not on file  Occupational History   Not on file  Tobacco Use   Smoking status: Former    Packs/day: 1.00    Years: 30.00    Total pack years: 30.00    Types: Cigarettes    Quit date: 01/14/1987    Years since quitting: 35.6   Smokeless tobacco: Former    Types: Chew   Tobacco comments:    quit smoking  & chewing by 1987  Vaping Use   Vaping Use: Never used  Substance and Sexual Activity   Alcohol use: Yes    Comment: 2-3 beers per night   Drug use: No   Sexual activity: Not Currently  Other Topics Concern   Not on file  Social History Narrative   Not on file   Social Determinants of Health   Financial Resource Strain: Not on file  Food Insecurity: Not on file  Transportation Needs: Not on file  Physical Activity: Not on file  Stress: Not on file  Social Connections: Not on file  Intimate Partner Violence: Not on file    Family History:   No Family history of CAD.  ROS:  Please see the history of present illness.  All other ROS reviewed and negative.     Physical Exam/Data:   Vitals:   08/21/22 0300 08/21/22 0400 08/21/22 0500 08/21/22 0600  BP: (!) 123/45 128/88 (!) 116/36 (!) 113/42  Pulse: (!) 59 (!) 58 (!) 55 (!) 52  Resp: 20 (!) 27 13 (!) 25  Temp:  98 F (36.7 C)    TempSrc:  Oral    SpO2: 91% 95% 94% 92%  Weight:   75.2 kg   Height:        Intake/Output Summary (Last 24  hours) at 08/21/2022 0903 Last data filed at 08/20/2022 1835 Gross per 24 hour  Intake 591.24 ml  Output 650 ml  Net -58.76 ml      08/21/2022    5:00 AM 08/20/2022    8:02 AM 08/20/2022    6:26 AM  Last 3 Weights  Weight (lbs) 165 lb 12.6 oz 166 lb 0.1 oz 164 lb 14.5 oz  Weight (kg) 75.2 kg 75.3 kg 74.8 kg     Body mass index is 25.21 kg/m.  General:  Elderly Male mild distress HEENT: Bilateral Frank's Sign Neck: no JVD Vascular: No carotid bruits; Distal pulses 2+ bilaterally   Cardiac:  normal S1, S2; RRR; no murmur  Lungs:  clear to auscultation bilaterally, no wheezing, rhonchi or rales  Abd: soft, nontender, no hepatomegaly  Ext: no edema Musculoskeletal:  No deformities, BUE and BLE strength normal and equal Skin: warm and dry  Neuro:  CNs 2-12 intact, no focal abnormalities noted Psych:  Normal affect    EKG:  The ECG that was done was personally reviewed and demonstrates SR with significant artifact Tele: SR with 1st HB  Relevant CV Studies:  LEFT HEART CATH AND  CORONARY ANGIOGRAPHY 02/16/2019  Narrative  Diffuse calcific coronary artery disease particularly affecting the left main and proximal to mid LAD and circumflex.  Widely patent left main  Eccentric 50 to 70% proximal LAD stenosis.  Mid eccentric 70 to 80% LAD.  Proximal to the mid LAD lesion the diagonal is moderate in size and contains diffuse 80 to 90% stenosis in the midsegment where the vessel diameter becomes very small.  Circumflex gives origin to 3 obtuse marginals with both the first and second obtuse marginal containing proximal 50 to 60% stenosis.  50% mid RCA stenosis.  Left ventriculography reveals focal mid anterior wall hypokinesis in the distribution of the diagonal.  EF is 60%.  LVEDP is normal.  RECOMMENDATIONS:   Symptoms are atypical, some of which I believe may be ischemic in origin and others possibly musculoskeletal.  This is primarily based upon the duration of the symptoms which  can last up to hours in a very localized left precordial position.  Add isosorbide 30 mg and increase the intensity of statin therapy.  Doubling her nitroglycerin for episodes of chest pain to gauge whether or not the symptoms are truly ischemic.  If symptoms progress, orbital or rotational atherectomy of the proximal and mid LAD would be considered.  Diagonal is not treatable.  Generally speaking, if possible to manage this patient medically and will probably be safer since he requires apixaban therapy chronically and if stent will need double antithrombotic therapy with either Plavix or Brilinta added to that regimen.  This would significantly increase bleeding risk.     ECHO COMPLETE WO IMAGING ENHANCING AGENT 12/25/2019  Narrative ECHOCARDIOGRAM REPORT    Patient Name:   SHRIHAAN PORZIO Date of Exam: 12/25/2019 Medical Rec #:  [id number]     Height:       68.0 in Accession #:    [id number]    Weight:       154.0 lb Date of Birth:  1946-08-15     BSA:          1.83 m Patient Age:    5 years      BP:           146/84 mmHg Patient Gender: M             HR:           58 bpm. Exam Location:  [facility]  Procedure: 2D Echo, Cardiac Doppler and Color Doppler  Indications:    Z95.1 (ICD-10-CM) - Hx of CABG  History:        Patient has prior history of Echocardiogram examinations, most recent 07/23/2017. CAD, Prior CABG, Arrythmias:Atrial Fibrillation; Risk Factors:Former Smoker. DOE (dyspnea on exertion).  Sonographer:    Alvino Chapel RCS Referring Phys: [place]   1. Left ventricular ejection fraction, by visual estimation, is 55 to 60%. The left ventricle has normal function. There is no left ventricular hypertrophy. 2. The left ventricle has no regional wall motion abnormalities. 3. Global right ventricle has normal systolic function.The right ventricular size is normal. No increase in right ventricular wall thickness. 4. Left atrial size was normal. 5.  Right atrial size was normal. 6. The mitral valve is normal in structure. No evidence of mitral valve regurgitation. No evidence of mitral stenosis. 7. The tricuspid valve is normal in structure. 8. The aortic valve is normal in structure. Aortic valve regurgitation is not visualized. No evidence of aortic valve sclerosis or stenosis. 9. The pulmonic valve  was normal in structure. Pulmonic valve regurgitation is not visualized. 10. Normal pulmonary artery systolic pressure. 11. The inferior vena cava is normal in size with greater than 50% respiratory variability, suggesting right atrial pressure of 3 mmHg.   Laboratory Data:  High Sensitivity Troponin:  No results for input(s): "TROPONINIHS" in the last 720 hours.    Chemistry Recent Labs  Lab 08/20/22 0453 08/21/22 0442  NA 138 140  K 4.0 3.5  CL 111 111  CO2 24 25  GLUCOSE 125* 121*  BUN 17 12  CREATININE 1.28* 0.78  CALCIUM 8.0* 8.1*  MG 1.9 1.9  GFRNONAA 58* >60  ANIONGAP 3* 4*    Recent Labs  Lab 08/20/22 1120 08/21/22 0442  PROT 5.3* 5.2*  ALBUMIN 2.7* 2.7*  AST 71* 71*  ALT 73* 79*  ALKPHOS 99 92  BILITOT 0.7 0.9   Lipids No results for input(s): "CHOL", "TRIG", "HDL", "LABVLDL", "LDLCALC", "CHOLHDL" in the last 168 hours. Hematology Recent Labs  Lab 08/20/22 0453 08/21/22 0442  WBC 11.8* 7.6  RBC 3.59* 3.50*  HGB 10.6* 10.3*  HCT 33.0* 32.2*  MCV 91.9 92.0  MCH 29.5 29.4  MCHC 32.1 32.0  RDW 14.6 14.6  PLT 150 130*   Thyroid No results for input(s): "TSH", "FREET4" in the last 168 hours. BNPNo results for input(s): "BNP", "PROBNP" in the last 168 hours.  DDimer No results for input(s): "DDIMER" in the last 168 hours.   Radiology/Studies:  US Abdomen Limited RUQ (LIVER/GB)  Result Date: 08/20/2022 CLINICAL DATA:  Cholelithiasis. EXAM: ULTRASOUND ABDOMEN LIMITED RIGHT UPPER QUADRANT COMPARISON:  CT scan of August 19, 2022. Ultrasound of June 26, 2022. FINDINGS: Gallbladder: Cholelithiasis is  noted with severe gallbladder wall thickening measuring 13 mm. No sonographic Murphy's sign is noted. Moderate amount of pericholecystic fluid is noted. Common bile duct: Diameter: 4 mm which is within normal limits. Liver: No focal lesion identified. Within normal limits in parenchymal echogenicity. Portal vein is patent on color Doppler imaging with normal direction of blood flow towards the liver. Other: Minimal ascites is noted. IMPRESSION: Cholelithiasis is noted with severe gallbladder wall thickening and pericholecystic fluid highly concerning for acute cholecystitis. These results will be called to the ordering clinician or representative by the Radiologist Assistant, and communication documented in the PACS or zVision Dashboard. Electronically Signed   By: Marijo Conception M.D.   On: 08/20/2022 10:48     Assessment and Plan:   Preoperative Risk Assessment  - The Revised Cardiac Risk Index = 1 due to CAD which equates to 0.9%: low risk of perioperative myocardial infarction, pulmonary edema, ventricular fibrillation, cardiac arrest, or complete heart block.  - he tolerated recent surgery in August well without cardiac complications - reasonable for surgery if indicated  CAD (CABG 2020 LIMA to LAD, SVG to PDA-OM)  HLD Urethral bleeding - no present indication for both DOAC AND ASA - both held presently - if needed, due to bleeding can be on ASA 81 mg PO daily first - continue home statin  PAF (SR in May of this year, s/p LAA exclusion with CABG; no f/u TEE or f/u imaging) - CHADSVASC 3 Prolonged Qtc with EKG artifact - repeat EKG, goal would be to return amiodarone as he will likely need this if he were to have surgery Bacteremia  - if TEE is needed (see ID recommendations) this would be reasonable from a cardiac standpoint and we would evaluation the LAA excision as well (based on this may stop  DOAC)  OSA intolerant to CPAP HTN  - no changes in therapy  Risk Assessment/Risk  Scores:    CHA2DS2-VASc Score = 3   This indicates a 3.2% annual risk of stroke. The patient's score is based upon: CHF History: 0 HTN History: 0 Diabetes History: 0 Stroke History: 0 Vascular Disease History: 1 Age Score: 2 Gender Score: 0       For questions or updates, please contact [place] Please consult [email or web address] for contact info under     Rudean Haskell, MD [facility]  [address] [place], [place] [address] [phone]  10:08 AM

## 2022-08-21 NOTE — Progress Notes (Signed)
EKG done per patient nurse request. EKG done and result showed to Dr Wynetta Emery. Patient denies any chest pain or discomfort and denies any shortness of breath. Dr Wynetta Emery aware. Patient nurse also made aware.

## 2022-08-21 NOTE — Progress Notes (Signed)
PROGRESS NOTE   Christopher [name]  ZOX:[id number] DOB: 03/04/1946 DOA: 08/19/2022 PCP: [facility]   Chief Complaint  Patient presents with   Emesis   Fever   Level of care: Stepdown  Brief Admission History:  76 y.o. male with medical history significant for gastric ulcer, coronary artery disease, BPH, atrial fibrillation on chronic anticoagulation. Patient presented to the ED with complaints of bleeding from his urethra today after self cath.  Patient had a UroLift procedure- 08/01/22 by Dr. [name].  Since then he has been having to self cath every other day, on the days he does not self cath, he reports normal urine.  Reports 2 days ago, he was having difficulty inserting the Foley catheter and he had pain but he was able to pass a true.  Yesterday he had he believes normal amount of urine, without blood.  Today he had a little bit of pain again, when he was self cathing, but subsequently started seeing blood, with clots.  He reports he tried milking his penis, get out the blood, and suddenly a jet of blood came out from his penis onto the wall with blood clots.  He reports some pain in his lower abdomen prior to this, but after the blood came out, he had relief. Reports 2 episodes of vomiting today, he otherwise denies difficulty breathing, no cough, no diarrhea.,  No pain or redness of his extremities. He is on Eliquis and has been compliant, his last dose of Eliquis was this morning   ED Course: Febrile to 103.  Heart rate 57-105.  Respirate rate 15-31.  Blood pressure systolic 81-191.  Lactic acidosis 4.9 > 3.2.  WBC 9.8.  UA with small leukocytes blood, 21-50 WBCs. 2.25 L sepsis fluid bolus given, maintenance of LR at 150.  Portable chest x-ray unremarkable.  Renal ultrasound could not be obtained so CT was ordered   CT abdomen and pelvis with contrast-distended gallbladder with gallstones, RUQ ultrasound recommended.  Moderate volume of stool, elongated urinary  bladder which extends to the level of the umbilicus, seen with urinary retention, no bladder wall thickening.   Cefepime vancomycin and metronidazole started.   Assessment and Plan: * Severe sepsis due to Enterococcus Febrile to 103.   With significant lactic acidosis 4.9 > 3.2.  Likely urinary source of enterococcus infection -continue supportive measures -Follow-up blood and urine cultures -Trend lactic acid -sepsis physiology resolving with aggressive treatments and supportive measures  Cholecystitis, acute -- discussed with surgery and we will get a HIDA scan today -- last dose of apixaban was 9/3 in AM -- clear liquid diet for now -- follow up LFTs -- continue IV antibiotic (amp/sulbactam) -- awaiting TTE for preoperative evaluation -- requesting cardiology consult for preoperative evaluation per surgery request  Enterococcus Bacteremia -- Appreciate ID consult and recommendations -- plan is to follow TTE and repeated BCs from 9/4 -- if repeated BCs positive then would need a TEE per Dr. Gale Journey -- continue IV antibiotics and follow cultures  -- continue supportive measures  Bradycardia -- HR as low as 30 up to mid 40s -- pt asymptomatic -- we have held amiodarone as a result of the bradycardia -- checking TTE and requesting cardiology consult  Hypomagnesemia Mag- 1.5. QTc prolonged at 503. He is on amiodarone but being held. - Replete  Bladder outlet obstruction CT abdomen and pelvis with contrast - elongated urinary bladder which extends to the level of the umbilicus, can be seen with urinary retention. -  Urology consultation appreciated -Inserted [place] foley catheter -Resume silodosin  Urinary tract infection UA suggestive with leukocytes, 21-50 WBCs.  Patient has been self cathing every other day.  He is status post UroLift procedure 8/16 by Dr. [name].  He had done well after this procedure until 2 days ago when he had difficulty and pain during self cath, and  today gross hematuria with clots. -Recent urine cultures-pansensitive Enterococcus faecalis and Klebsiella oxytoca -CT abdomen and pelvis-showing distended urinary bladder suggesting urinary retention, distended gallbladder with stones -Mild liver enzyme elevation, AST 65, ALT 69, will obtain follow-up RUQ ultrasound -IV ceftriaxone 2 g daily -Follow-up urine cultures  Atrial fibrillation, chronic (HCC) Heart rate has been very low down to 30-45 range. On Eliquis, and he believes he is on amiodarone. -Hold Eliquis -Hold amiodarone  Coronary artery disease involving native coronary artery of native heart with angina pectoris [facility]) --requesting cardiology consult for preoperative evaluation  DVT prophylaxis: SCDs Code Status: Full  Family Communication: wife, daughter updated 9/4 Disposition: Status is: Inpatient Remains inpatient appropriate because: IV antibiotics, IV fluids,    Consultants:  Surgery Urology Cardiology ID Procedures:   Antimicrobials:  Ceftriaxone 9/3>>9/4 Metronidazole 9/3>>9/4 Vancomycin 9/3-9/4  Ampicillin/sulbactam 9/4>>  Subjective: Pt says that he really feels "terrible" he has malaise, fatigue, nausea, he feels like he needs to empty bladder  Objective: Vitals:   08/21/22 0300 08/21/22 0400 08/21/22 0500 08/21/22 0600  BP: (!) 123/45 128/88 (!) 116/36 (!) 113/42  Pulse: (!) 59 (!) 58 (!) 55 (!) 52  Resp: 20 (!) 27 13 (!) 25  Temp:  98 F (36.7 C)    TempSrc:  Oral    SpO2: 91% 95% 94% 92%  Weight:   75.2 kg   Height:        Intake/Output Summary (Last 24 hours) at 08/21/2022 0857 Last data filed at 08/20/2022 1835 Gross per 24 hour  Intake 591.24 ml  Output 650 ml  Net -58.76 ml   Filed Weights   08/20/22 0626 08/20/22 0802 08/21/22 0500  Weight: 74.8 kg 75.3 kg 75.2 kg   Examination:  General exam: Appears calm and comfortable, no apparent distress.   Respiratory system: mild tachypnea, no rales heard, no wheezing. Respiratory  effort normal. Cardiovascular system: bradycardic rate, normal S1 & S2 heard.  No pedal edema. Gastrointestinal system: Abdomen is nondistended, soft and tender RUQ. No organomegaly or masses felt. Normal bowel sounds heard. Central nervous system: Alert and oriented. No focal neurological deficits. Extremities: Symmetric 5 x 5 power. Skin: No rashes, lesions or ulcers. Psychiatry: Judgement and insight appear normal. Mood & affect appropriate.   Data Reviewed: I have personally reviewed following labs and imaging studies  CBC: Recent Labs  Lab 08/19/22 1208 08/20/22 0453 08/21/22 0442  WBC 9.8 11.8* 7.6  NEUTROABS 8.6*  --  5.7  HGB 13.6 10.6* 10.3*  HCT 42.6 33.0* 32.2*  MCV 92.2 91.9 92.0  PLT 232 150 130*    Basic Metabolic Panel: Recent Labs  Lab 08/19/22 1208 08/19/22 1212 08/20/22 0453 08/21/22 0442  NA 140  --  138 140  K 3.9  --  4.0 3.5  CL 107  --  111 111  CO2 23  --  24 25  GLUCOSE 100*  --  125* 121*  BUN 12  --  17 12  CREATININE 1.07  --  1.28* 0.78  CALCIUM 9.0  --  8.0* 8.1*  MG  --  1.5* 1.9 1.9    CBG: No  results for input(s): "GLUCAP" in the last 168 hours.  Recent Results (from the past 240 hour(s))  Resp Panel by RT-PCR (Flu A&B, Covid) Anterior Nasal Swab     Status: None   Collection Time: 08/19/22 12:02 PM   Specimen: Anterior Nasal Swab  Result Value Ref Range Status   SARS Coronavirus 2 by RT PCR NEGATIVE NEGATIVE Final    Comment: (NOTE) SARS-CoV-2 target nucleic acids are NOT DETECTED.  The SARS-CoV-2 RNA is generally detectable in upper respiratory specimens during the acute phase of infection. The lowest concentration of SARS-CoV-2 viral copies this assay can detect is 138 copies/mL. A negative result does not preclude SARS-Cov-2 infection and should not be used as the sole basis for treatment or other patient management decisions. A negative result may occur with  improper specimen collection/handling, submission of specimen  other than nasopharyngeal swab, presence of viral mutation(s) within the areas targeted by this assay, and inadequate number of viral copies(<138 copies/mL). A negative result must be combined with clinical observations, patient history, and epidemiological information. The expected result is Negative.  Fact Sheet for Patients:  EntrepreneurPulse.com.au  Fact Sheet for Healthcare Providers:  IncredibleEmployment.be  This test is no t yet approved or cleared by the [place] FDA and  has been authorized for detection and/or diagnosis of SARS-CoV-2 by FDA under an Emergency Use Authorization (EUA). This EUA will remain  in effect (meaning this test can be used) for the duration of the COVID-19 declaration under Section 564(b)(1) of the Act, 21 U.S.C.section 360bbb-3(b)(1), unless the authorization is terminated  or revoked sooner.       Influenza A by PCR NEGATIVE NEGATIVE Final   Influenza B by PCR NEGATIVE NEGATIVE Final    Comment: (NOTE) The Xpert Xpress SARS-CoV-2/FLU/RSV plus assay is intended as an aid in the diagnosis of influenza from Nasopharyngeal swab specimens and should not be used as a sole basis for treatment. Nasal washings and aspirates are unacceptable for Xpert Xpress SARS-CoV-2/FLU/RSV testing.  Fact Sheet for Patients: EntrepreneurPulse.com.au  Fact Sheet for Healthcare Providers: IncredibleEmployment.be  This test is not yet approved or cleared by the [place] FDA and has been authorized for detection and/or diagnosis of SARS-CoV-2 by FDA under an Emergency Use Authorization (EUA). This EUA will remain in effect (meaning this test can be used) for the duration of the COVID-19 declaration under Section 564(b)(1) of the Act, 21 U.S.C. section 360bbb-3(b)(1), unless the authorization is terminated or revoked.  Performed at [facility], [address] [place], [place]  [address]   Culture, blood (Routine x 2)     Status: None (Preliminary result)   Collection Time: 08/19/22 12:07 PM   Specimen: Right Antecubital; Blood  Result Value Ref Range Status   Specimen Description   Final    RIGHT ANTECUBITAL BOTTLES DRAWN AEROBIC AND ANAEROBIC Performed at [facility], [address] [place], [place] [address]    Special Requests   Final    Blood Culture adequate volume Performed at [facility], [address], [place], [place] [address]    Culture  Setup Time   Final    GRAM POSITIVE COCCI Gram Stain Report Called to,Read Back By and Verified With: M. [place] @ 6010 BY STEPHTR 08/20/22 AEROBIC BOTTLE ONLY CRITICAL VALUE NOTED.  VALUE IS CONSISTENT WITH PREVIOUSLY REPORTED AND CALLED VALUE. Performed at [facility], [place] [address] [place], [place] [address]    Culture GRAM POSITIVE COCCI  Final   Report Status PENDING  Incomplete  Culture, blood (Routine x 2)     Status: None (Preliminary result)   Collection Time: 08/19/22 12:08 PM   Specimen: Left Antecubital; Blood  Result Value Ref Range Status   Specimen Description   Final    LEFT ANTECUBITAL BOTTLES DRAWN AEROBIC AND ANAEROBIC Performed at [facility], [address], [place], [place] [address]    Special Requests   Final    Blood Culture adequate volume Performed at [facility], [address], [place], [place] [address]    Culture  Setup Time   Final    GRAM POSITIVE COCCI Gram Stain Report Called to,Read Back By and Verified With: M. FAULK @ 0812 BY STEPHTR 08/20/22 AEROBIC BOTTLE ONLY CRITICAL RESULT CALLED TO, READ BACK BY AND VERIFIED WITH: PHARMD S [place] [id number] AT 1207 BY CM GRAM POSITIVE COCCI ANAEROBIC BOTTLE ONLY CRITICAL VALUE NOTED.  VALUE IS CONSISTENT WITH PREVIOUSLY REPORTED AND CALLED VALUE. Performed at [facility], [address], [place], [place] [address]    Culture [place]  Final   Report Status PENDING  Incomplete  Blood Culture ID Panel (Reflexed)      Status: Abnormal   Collection Time: 08/19/22 12:08 PM  Result Value Ref Range Status   Enterococcus faecalis DETECTED (A) NOT DETECTED Final    Comment: CRITICAL RESULT CALLED TO, READ BACK BY AND VERIFIED WITH: Cloyde Reams [id number] AT 1207 BY CM    Enterococcus Faecium NOT DETECTED NOT DETECTED Final   Listeria monocytogenes NOT DETECTED NOT DETECTED Final   Staphylococcus species NOT DETECTED NOT DETECTED Final   Staphylococcus aureus (BCID) NOT DETECTED NOT DETECTED Final   Staphylococcus epidermidis NOT DETECTED NOT DETECTED Final   Staphylococcus lugdunensis NOT DETECTED NOT DETECTED Final   Streptococcus species NOT DETECTED NOT DETECTED Final   Streptococcus agalactiae NOT DETECTED NOT DETECTED Final   Streptococcus pneumoniae NOT DETECTED NOT DETECTED Final   Streptococcus pyogenes NOT DETECTED NOT DETECTED Final   A.calcoaceticus-baumannii NOT DETECTED NOT DETECTED Final   Bacteroides fragilis NOT DETECTED NOT DETECTED Final   Enterobacterales NOT DETECTED NOT DETECTED Final   Enterobacter cloacae complex NOT DETECTED NOT DETECTED Final   Escherichia coli NOT DETECTED NOT DETECTED Final   Klebsiella aerogenes NOT DETECTED NOT DETECTED Final   Klebsiella oxytoca NOT DETECTED NOT DETECTED Final   Klebsiella pneumoniae NOT DETECTED NOT DETECTED Final   Proteus species NOT DETECTED NOT DETECTED Final   Salmonella species NOT DETECTED NOT DETECTED Final   Serratia marcescens NOT DETECTED NOT DETECTED Final   Haemophilus influenzae NOT DETECTED NOT DETECTED Final   Neisseria meningitidis NOT DETECTED NOT DETECTED Final   Pseudomonas aeruginosa NOT DETECTED NOT DETECTED Final   Stenotrophomonas maltophilia NOT DETECTED NOT DETECTED Final   Candida albicans NOT DETECTED NOT DETECTED Final   Candida auris NOT DETECTED NOT DETECTED Final   Candida glabrata NOT DETECTED NOT DETECTED Final   Candida krusei NOT DETECTED NOT DETECTED Final   Candida parapsilosis NOT DETECTED NOT  DETECTED Final   Candida tropicalis NOT DETECTED NOT DETECTED Final   Cryptococcus neoformans/gattii NOT DETECTED NOT DETECTED Final   Vancomycin resistance NOT DETECTED NOT DETECTED Final    Comment: Performed at [facility], [address], [place], [place] [address]  Culture, blood (Routine X 2) w Reflex to ID Panel     Status: None (Preliminary result)   Collection Time: 08/20/22  3:54 PM   Specimen: BLOOD LEFT HAND  Result Value Ref Range Status  Specimen Description   Final    BLOOD LEFT HAND BOTTLES DRAWN AEROBIC AND ANAEROBIC   Special Requests   Final    Blood Culture adequate volume Performed at [facility], [address] [place], [place] [address]    Culture PENDING  Incomplete   Report Status PENDING  Incomplete  Culture, blood (Routine X 2) w Reflex to ID Panel     Status: None (Preliminary result)   Collection Time: 08/20/22  3:54 PM   Specimen: BLOOD RIGHT HAND  Result Value Ref Range Status   Specimen Description   Final    BLOOD RIGHT HAND BOTTLES DRAWN AEROBIC AND ANAEROBIC   Special Requests   Final    Blood Culture adequate volume Performed at [facility], [address], [place], [place] [address]    Culture PENDING  Incomplete   Report Status PENDING  Incomplete  MRSA Next Gen by PCR, Nasal     Status: None   Collection Time: 08/20/22  4:46 PM   Specimen: Nasal Mucosa; Nasal Swab  Result Value Ref Range Status   MRSA by PCR Next Gen NOT DETECTED NOT DETECTED Final    Comment: (NOTE) The GeneXpert MRSA Assay (FDA approved for NASAL specimens only), is one component of a comprehensive MRSA colonization surveillance program. It is not intended to diagnose MRSA infection nor to guide or monitor treatment for MRSA infections. Test performance is not FDA approved in patients less than 48 years old. Performed at [facility], [address], [place], [place] [address]      Radiology Studies: US Abdomen Limited RUQ (LIVER/GB)  Result Date:  08/20/2022 CLINICAL DATA:  Cholelithiasis. EXAM: ULTRASOUND ABDOMEN LIMITED RIGHT UPPER QUADRANT COMPARISON:  CT scan of August 19, 2022. Ultrasound of June 26, 2022. FINDINGS: Gallbladder: Cholelithiasis is noted with severe gallbladder wall thickening measuring 13 mm. No sonographic Murphy's sign is noted. Moderate amount of pericholecystic fluid is noted. Common bile duct: Diameter: 4 mm which is within normal limits. Liver: No focal lesion identified. Within normal limits in parenchymal echogenicity. Portal vein is patent on color Doppler imaging with normal direction of blood flow towards the liver. Other: Minimal ascites is noted. IMPRESSION: Cholelithiasis is noted with severe gallbladder wall thickening and pericholecystic fluid highly concerning for acute cholecystitis. These results will be called to the ordering clinician or representative by the Radiologist Assistant, and communication documented in the PACS or zVision Dashboard. Electronically Signed   By: Marijo Conception M.D.   On: 08/20/2022 10:48   DG CHEST PORT 1 VIEW  Result Date: 08/20/2022 CLINICAL DATA:  Difficulty breathing.  Hypoxia. EXAM: PORTABLE CHEST 1 VIEW COMPARISON:  August 19, 2022. FINDINGS: Stable cardiomegaly. Sternotomy wires are noted. Mild bibasilar subsegmental atelectasis is noted. Bony thorax is unremarkable. IMPRESSION: Mild bibasilar subsegmental atelectasis. Electronically Signed   By: Marijo Conception M.D.   On: 08/20/2022 09:53   CT ABDOMEN PELVIS W CONTRAST  Result Date: 08/19/2022 CLINICAL DATA:  Sepsis. Vomiting. [age] year. Patient reports surgery 1 month ago on penis. EXAM: CT ABDOMEN AND PELVIS WITH CONTRAST TECHNIQUE: Multidetector CT imaging of the abdomen and pelvis was performed using the standard protocol following bolus administration of intravenous contrast. RADIATION DOSE REDUCTION: This exam was performed according to the departmental dose-optimization program which includes automated exposure  control, adjustment of the mA and/or kV according to patient size and/or use of iterative reconstruction technique. CONTRAST:  148m OMNIPAQUE IOHEXOL 350 MG/ML SOLN COMPARISON:  Abdominopelvic CT 02/06/2022 FINDINGS: Lower chest:  Dependent atelectasis with trace bilateral pleural thickening, no significant effusion. Cardiomegaly with coronary artery calcifications. Prior median sternotomy. Hepatobiliary: There is no focal hepatic lesion. Gallbladder is distended. Patient motion artifact through the gallbladder limits assessment for pericholecystic inflammation. There is no definite wall thickening. 15 mm rounded high-density in the gallbladder likely represent gallstones, with compared with 06/26/2022 abdominal ultrasound. There is diffuse periportal edema. No biliary dilatation. Pancreas: No ductal dilatation or inflammation. Spleen: Tiny subcentimeter hypodensity in the lower spleen, nonspecific. Spleen is normal in size. Adrenals/Urinary Tract: Normal adrenal glands. Mild prominence of the right renal collecting system without frank hydronephrosis. Mild thinning of bilateral renal parenchyma calcification in the right renal hilum is felt to be vascular rather than nonobstructing stone. Elongated urinary bladder which extends to the level of the umbilicus. No bladder wall thickening. Stomach/Bowel: No bowel obstruction or inflammatory change. Moderate volume of colonic stool. There is stool distending the rectum. No rectal or colonic wall thickening. Stomach is decompressed. Appendix not visualized. Vascular/Lymphatic: Aortic and branch atherosclerosis. No aortic aneurysm. Patent portal vein. Prominent left inguinal node measures 10 mm short axis. This is slightly more prominent than on prior exam. Reproductive: Surgical clips in the prostate which is prominent size. No evidence of prostatic fluid collection. The included portion of the penis and urethra are unremarkable. Other: No ascites. No free air. Tiny fat  containing umbilical hernia. Minimal fat in the left inguinal canal. Musculoskeletal: Postsurgical change in the lumbar spine. Spinal stimulator in place. No focal bone lesions. IMPRESSION: 1. Distended gallbladder with gallstones. Patient motion artifact through the gallbladder limits assessment for pericholecystic inflammation. Consider right upper quadrant ultrasound for further evaluation as clinically indicated. 2. Moderate volume of colonic stool with stool distending the rectum, suggesting constipation. 3. Elongated urinary bladder which extends to the level of the umbilicus, can be seen with urinary retention. No bladder wall thickening. 4. Prominent left inguinal node is slightly more prominent than on prior exam, likely reactive. 5. Additional chronic findings as described. Aortic Atherosclerosis (ICD10-I70.0). Electronically Signed   By: Keith Rake M.D.   On: 08/19/2022 16:44   DG Chest Port 1 View  Result Date: 08/19/2022 CLINICAL DATA:  Fever.  Recent surgery. EXAM: PORTABLE CHEST 1 VIEW COMPARISON:  01/01/2021. FINDINGS: Stable changes from prior cardiac surgery. No mediastinal or hilar masses. Clear lungs.  No pleural effusion or pneumothorax. Skeletal structures are grossly intact. IMPRESSION: No active disease. Electronically Signed   By: Lajean Manes M.D.   On: 08/19/2022 12:30    Scheduled Meds:  ALPRAZolam  1 mg Oral QHS   atorvastatin  80 mg Oral Daily   Chlorhexidine Gluconate Cloth  6 each Topical Daily   finasteride  5 mg Oral Daily   gabapentin  200 mg Oral TID   lubiprostone  24 mcg Oral BID WC   pantoprazole  40 mg Oral BID   polyethylene glycol  17 g Oral BID   tamsulosin  0.4 mg Oral QPC supper   Continuous Infusions:  ampicillin-sulbactam (UNASYN) IV 3 g (08/21/22 0200)   lactated ringers Stopped (08/20/22 1710)     LOS: 2 days    Irwin Brakeman, MD How to contact the [facility] Attending or Consulting provider [place] or covering provider during after hours  [place], for this patient?  Check the care team in [facility] and look for a) attending/consulting [facility] provider listed and b) the [facility] team listed Log into [email or web address] and use [place]'s universal password to access. If  you do not have the password, please contact the hospital operator. Locate the [facility] provider you are looking for under [facility] and page to a number that you can be directly reached. If you still have difficulty reaching the provider, please page the [facility] (Director on Call) for the Hospitalists listed on amion for assistance.  08/21/2022, 8:57 AM

## 2022-08-21 NOTE — Assessment & Plan Note (Addendum)
--   HR as low as low 40s (primarily) when asleep - HR low 50s when awake -- pt asymptomatic -- we have held amiodarone as a result of the bradycardia>>restarted 9/5 -- HR remained in low 50s--discussed with cardiology 08/24/22 --ok to remain on current dose amiodarone.  They will arrange outpt follow up

## 2022-08-21 NOTE — Consult Note (Signed)
Reason for Consult: Cholecystitis, cholelithiasis Referring Physician: Dr. Sandi Carne is an 76 y.o. male.  HPI: Patient is a 76 year old white male who was admitted on 08/19/2022 with fever, chills, and generalized malaise.  He also was complaining about hematuria.  He is on Eliquis for atrial fibrillation.  He was admitted to the ICU with sepsis.  He had 2 to blood cultures positive for Enterococcus faecalis.  He also developed right upper quadrant and epigastric pain.  He has had mild transaminitis.  CT scan of the abdomen pelvis revealed cholelithiasis.  Ultrasound the gallbladder revealed cholelithiasis with a thickened gallbladder wall and pericholecystic fluid consistent with acute cholecystitis.  Common bile duct within normal limits.  He states this developed the day he entered the hospital.  His Eliquis has been stopped.  Infectious disease consultation feels that this may be secondary to a urinary source.  Currently, the patient still complains of some epigastric and right upper quadrant abdominal discomfort.  He has been tolerating a clear liquid diet.  He never has had an episode like this in the past.  Past Medical History:  Diagnosis Date   A-fib [facility])    Arthritis    "all over my body" (06/17/2017)   BPH (benign prostatic hyperplasia)    takes Proscar daily   CAD (coronary artery disease)    a. s/p CABG in 09/2019 with SVG-PDA-OM and LIMA-LAD with LAA excision   Chronic back pain    DDD; "upper and lower" (06/17/2017)   Constipation    takes Colace daily   Coronary artery disease    DDD (degenerative disc disease), cervical    DVT (deep venous thrombosis) (HCC)    left peroneal vein 07/2017 (in setting of recent left TKA)   Dysrhythmia    history of Atrial fibrillation, no long has this   Enlarged prostate    GERD (gastroesophageal reflux disease)    takes Nexium daily   H/O hiatal hernia    History of colon polyps    benign   History of gastric ulcer    Insomnia     takes Xanax nightly   Joint capsule tear    left knee   Joint pain    Joint swelling    Macular degeneration    Muscle spasm of back    takes Valium daily as needed   PAF (paroxysmal atrial fibrillation) (HCC)    Peripheral neuropathy    takes Gabapentin daily   PONV (postoperative nausea and vomiting)    Pre-diabetes    Sleep apnea    pt does not wear a cpap   Urinary hesitancy    Weakness    numbness and tingling    Past Surgical History:  Procedure Laterality Date   ANTERIOR CERVICAL DECOMP/DISCECTOMY FUSION  ~ 1991   "took bone from hip"   ANTERIOR CERVICAL DECOMP/DISCECTOMY FUSION  X 2   "put plate in"   ANTERIOR CERVICAL DISCECTOMY  <11/2005    5-6 and 4-5./notes 05/02/2011   APPENDECTOMY     BACK SURGERY     BIOPSY  05/30/2018   Procedure: BIOPSY;  Surgeon: [name], MD;  [facility];  Service: Endoscopy;;  esophagus   BIOPSY  01/09/2022   Procedure: BIOPSY;  Surgeon: Harvel Quale, MD;  [facility];  Service: Gastroenterology;;   CARDIAC CATHETERIZATION     3-4 per pt   CARPAL TUNNEL RELEASE Bilateral 10/2005 - 11/2005   right-left [facility] 05/02/2011  CATARACT EXTRACTION W/PHACO Right 05/24/2014   Procedure: CATARACT EXTRACTION PHACO AND INTRAOCULAR LENS PLACEMENT (IOC);  Surgeon: [name], MD;  Location: [facility];  Service: Ophthalmology;  Laterality: Right;  CDE 6.74   CATARACT EXTRACTION W/PHACO Left 06/17/2014   Procedure: CATARACT EXTRACTION PHACO AND INTRAOCULAR LENS PLACEMENT (IOC);  Surgeon: [name], MD;  Location: [facility];  Service: Ophthalmology;  Laterality: Left;  CDE:4.65   COLONOSCOPY WITH ESOPHAGOGASTRODUODENOSCOPY (EGD) N/A 01/22/2013   Procedure: COLONOSCOPY WITH ESOPHAGOGASTRODUODENOSCOPY (EGD);  Surgeon: [name], MD;  [facility];  Service: Endoscopy;  Laterality: N/A;  140   COLONOSCOPY WITH PROPOFOL N/A 05/30/2018   diverticulosis and external hemorrhoids   CORONARY ARTERY  BYPASS GRAFT  2020   CYSTOSCOPY WITH INSERTION OF UROLIFT N/A 04/16/2022   Procedure: CYSTOSCOPY WITH INSERTION OF UROLIFT;  Surgeon: Cleon Gustin, MD;  Location: [facility];  Service: Urology;  Laterality: N/A;   ESOPHAGOGASTRODUODENOSCOPY (EGD) WITH PROPOFOL N/A 05/30/2018   normal esophagus, presence of possible barretts (bx consistent with reflux changes, GAVE)   ESOPHAGOGASTRODUODENOSCOPY (EGD) WITH PROPOFOL N/A 01/09/2022   Procedure: ESOPHAGOGASTRODUODENOSCOPY (EGD) WITH PROPOFOL;  Surgeon: Harvel Quale, MD;  [facility];  Service: Gastroenterology;  Laterality: N/A;  305, moved up per Leigh Amm   EYE SURGERY Bilateral    "cleaned film w/laser; since my 1st cataract OR"   JOINT REPLACEMENT     LEFT HEART CATH AND CORONARY ANGIOGRAPHY N/A 02/16/2019   Procedure: LEFT HEART CATH AND CORONARY ANGIOGRAPHY;  Surgeon: Belva Crome, MD;  Location: [place] [facility];  Service: Cardiovascular;  Laterality: N/A;   POSTERIOR LUMBAR FUSION     "put a plate in"   QUADRICEPS TENDON REPAIR Left 07/01/2017   QUADRICEPS TENDON REPAIR Left 07/01/2017   Procedure: REPAIR QUADRICEP TENDON;  Surgeon: [name], MD;  Location: [place];  Service: Orthopedics;  Laterality: Left;   SHOULDER ARTHROSCOPY Bilateral    SPINAL CORD STIMULATOR INSERTION N/A 09/23/2015   Procedure: LUMBAR SPINAL CORD STIMULATOR INSERTION;  Surgeon: [name], MD;  Location: [place] [facility];  Service: Neurosurgery;  Laterality: N/A;  LUMBAR SPINAL CORD STIMULATOR INSERTION   SPINAL CORD STIMULATOR INSERTION N/A 04/25/2022   Procedure: REVISION OF  SPINAL CORD STIMULATOR;  Surgeon: Newman Pies, MD;  Location: [place];  Service: Neurosurgery;  Laterality: N/A;   TOTAL KNEE ARTHROPLASTY Left 06/17/2017   TOTAL KNEE ARTHROPLASTY Left 06/17/2017   Procedure: TOTAL KNEE ARTHROPLASTY;  Surgeon: [name], MD;  Location: [place];  Service: Orthopedics;  Laterality: Left;   TOTAL KNEE ARTHROPLASTY Right  12/16/2017   Procedure: TOTAL KNEE ARTHROPLASTY;  Surgeon: [name], MD;  Location: [place];  Service: Orthopedics;  Laterality: Right;    History reviewed. No pertinent family history.  Social History:  reports that he quit smoking about 35 years ago. His smoking use included cigarettes. He has a 30.00 pack-year smoking history. He has quit using smokeless tobacco.  His smokeless tobacco use included chew. He reports current alcohol use. He reports that he does not use drugs.  Allergies:  Allergies  Allergen Reactions   Tylenol [Acetaminophen] Other (See Comments)    Stomach pain    Morphine And Related Nausea And Vomiting   Propofol Nausea And Vomiting    Medications: I have reviewed the patient's current medications.  Results for orders placed or performed during the hospital encounter of 08/19/22 (from the past 48 hour(s))  Lactic acid, plasma     Status: Abnormal  Collection Time: 08/19/22 12:02 PM  Result Value Ref Range   Lactic Acid, Venous 4.9 (HH) 0.5 - 1.9 mmol/L    Comment: CRITICAL RESULT CALLED TO, READ BACK BY AND VERIFIED WITH: JAMIE LONG '@1237'$  08/19/22 BY GMCGEEHON. Performed at [facility], [address], [place], [place] [address]   Resp Panel by RT-PCR (Flu A&B, Covid) Anterior Nasal Swab     Status: None   Collection Time: 08/19/22 12:02 PM   Specimen: Anterior Nasal Swab  Result Value Ref Range   SARS Coronavirus 2 by RT PCR NEGATIVE NEGATIVE    Comment: (NOTE) SARS-CoV-2 target nucleic acids are NOT DETECTED.  The SARS-CoV-2 RNA is generally detectable in upper respiratory specimens during the acute phase of infection. The lowest concentration of SARS-CoV-2 viral copies this assay can detect is 138 copies/mL. A negative result does not preclude SARS-Cov-2 infection and should not be used as the sole basis for treatment or other patient management decisions. A negative result may occur with  improper specimen collection/handling, submission of  specimen other than nasopharyngeal swab, presence of viral mutation(s) within the areas targeted by this assay, and inadequate number of viral copies(<138 copies/mL). A negative result must be combined with clinical observations, patient history, and epidemiological information. The expected result is Negative.  Fact Sheet for Patients:  EntrepreneurPulse.com.au  Fact Sheet for Healthcare Providers:  IncredibleEmployment.be  This test is no t yet approved or cleared by the [place] FDA and  has been authorized for detection and/or diagnosis of SARS-CoV-2 by FDA under an Emergency Use Authorization (EUA). This EUA will remain  in effect (meaning this test can be used) for the duration of the COVID-19 declaration under Section 564(b)(1) of the Act, 21 U.S.C.section 360bbb-3(b)(1), unless the authorization is terminated  or revoked sooner.       Influenza A by PCR NEGATIVE NEGATIVE   Influenza B by PCR NEGATIVE NEGATIVE    Comment: (NOTE) The Xpert Xpress SARS-CoV-2/FLU/RSV plus assay is intended as an aid in the diagnosis of influenza from Nasopharyngeal swab specimens and should not be used as a sole basis for treatment. Nasal washings and aspirates are unacceptable for Xpert Xpress SARS-CoV-2/FLU/RSV testing.  Fact Sheet for Patients: EntrepreneurPulse.com.au  Fact Sheet for Healthcare Providers: IncredibleEmployment.be  This test is not yet approved or cleared by the [place] FDA and has been authorized for detection and/or diagnosis of SARS-CoV-2 by FDA under an Emergency Use Authorization (EUA). This EUA will remain in effect (meaning this test can be used) for the duration of the COVID-19 declaration under Section 564(b)(1) of the Act, 21 U.S.C. section 360bbb-3(b)(1), unless the authorization is terminated or revoked.  Performed at [facility], [address], [place], [place]  [address]   Culture, blood (Routine x 2)     Status: None (Preliminary result)   Collection Time: 08/19/22 12:07 PM   Specimen: Right Antecubital; Blood  Result Value Ref Range   Specimen Description      RIGHT ANTECUBITAL BOTTLES DRAWN AEROBIC AND ANAEROBIC Performed at [facility], [address] [place], [place] [address]    Special Requests      Blood Culture adequate volume Performed at [facility], [address] [place],  [address]    Culture  Setup Time      GRAM POSITIVE COCCI Gram Stain Report Called to,Read Back By and Verified With: M. [place] @ 4656 BY STEPHTR 08/20/22 AEROBIC BOTTLE ONLY CRITICAL VALUE NOTED.  VALUE IS CONSISTENT WITH PREVIOUSLY REPORTED AND CALLED  VALUE. Performed at [facility], [place] [address] [place], [place] [address]    Culture GRAM POSITIVE COCCI    Report Status PENDING   Comprehensive metabolic panel     Status: Abnormal   Collection Time: 08/19/22 12:08 PM  Result Value Ref Range   Sodium 140 135 - 145 mmol/L   Potassium 3.9 3.5 - 5.1 mmol/L   Chloride 107 98 - 111 mmol/L   CO2 23 22 - 32 mmol/L   Glucose, Bld 100 (H) 70 - 99 mg/dL    Comment: Glucose reference range applies only to samples taken after fasting for at least 8 hours.   BUN 12 8 - 23 mg/dL   Creatinine, Ser 1.07 0.61 - 1.24 mg/dL   Calcium 9.0 8.9 - 10.3 mg/dL   Total Protein 6.9 6.5 - 8.1 g/dL   Albumin 3.7 3.5 - 5.0 g/dL   AST 65 (H) 15 - 41 U/L   ALT 69 (H) 0 - 44 U/L   Alkaline Phosphatase 126 38 - 126 U/L   Total Bilirubin 1.2 0.3 - 1.2 mg/dL   GFR, Estimated >60 >60 mL/min    Comment: (NOTE) Calculated using the CKD-EPI Creatinine Equation (2021)    Anion gap 10 5 - 15    Comment: Performed at [facility], [address], [place], [place] [address]  CBC with Differential     Status: Abnormal   Collection Time: 08/19/22 12:08 PM  Result Value Ref Range   WBC 9.8 4.0 - 10.5 K/uL   RBC 4.62 4.22 - 5.81 MIL/uL   Hemoglobin 13.6 13.0 - 17.0 g/dL    HCT 42.6 39.0 - 52.0 %   MCV 92.2 80.0 - 100.0 fL   MCH 29.4 26.0 - 34.0 pg   MCHC 31.9 30.0 - 36.0 g/dL   RDW 14.2 11.5 - 15.5 %   Platelets 232 150 - 400 K/uL   nRBC 0.0 0.0 - 0.2 %   Neutrophils Relative % 87 %   Neutro Abs 8.6 (H) 1.7 - 7.7 K/uL   Lymphocytes Relative 11 %   Lymphs Abs 1.1 0.7 - 4.0 K/uL   Monocytes Relative 1 %   Monocytes Absolute 0.1 0.1 - 1.0 K/uL   Eosinophils Relative 0 %   Eosinophils Absolute 0.0 0.0 - 0.5 K/uL   Basophils Relative 0 %   Basophils Absolute 0.0 0.0 - 0.1 K/uL   Immature Granulocytes 1 %   Abs Immature Granulocytes 0.05 0.00 - 0.07 K/uL    Comment: Performed at [facility], [address], [place], [place] [address]  Protime-INR     Status: Abnormal   Collection Time: 08/19/22 12:08 PM  Result Value Ref Range   Prothrombin Time 15.3 (H) 11.4 - 15.2 seconds   INR 1.2 0.8 - 1.2    Comment: (NOTE) INR goal varies based on device and disease states. Performed at [facility], [address], [place], [place] [address]   Culture, blood (Routine x 2)     Status: None (Preliminary result)   Collection Time: 08/19/22 12:08 PM   Specimen: Left Antecubital; Blood  Result Value Ref Range   Specimen Description      LEFT ANTECUBITAL BOTTLES DRAWN AEROBIC AND ANAEROBIC Performed at [facility], [address] [place], [place] [address]    Special Requests      Blood Culture adequate volume Performed at [facility], [address] [place], [place] [address]    Culture  Setup Time  GRAM POSITIVE COCCI Gram Stain Report Called to,Read Back By and Verified With: M. FAULK @ 0812 BY STEPHTR 08/20/22 AEROBIC BOTTLE ONLY CRITICAL RESULT CALLED TO, READ BACK BY AND VERIFIED WITH: PHARMD S [place] [id number] AT 1207 BY CM GRAM POSITIVE COCCI ANAEROBIC BOTTLE ONLY CRITICAL VALUE NOTED.  VALUE IS CONSISTENT WITH PREVIOUSLY REPORTED AND CALLED VALUE. Performed at [facility], [address] [place], [place] [address]    Culture [place]     Report Status PENDING   Blood Culture ID Panel (Reflexed)     Status: Abnormal   Collection Time: 08/19/22 12:08 PM  Result Value Ref Range   Enterococcus faecalis DETECTED (A) NOT DETECTED    Comment: CRITICAL RESULT CALLED TO, READ BACK BY AND VERIFIED WITH: Cloyde Reams [id number] AT 1207 BY CM    Enterococcus Faecium NOT DETECTED NOT DETECTED   Listeria monocytogenes NOT DETECTED NOT DETECTED   Staphylococcus species NOT DETECTED NOT DETECTED   Staphylococcus aureus (BCID) NOT DETECTED NOT DETECTED   Staphylococcus epidermidis NOT DETECTED NOT DETECTED   Staphylococcus lugdunensis NOT DETECTED NOT DETECTED   Streptococcus species NOT DETECTED NOT DETECTED   Streptococcus agalactiae NOT DETECTED NOT DETECTED   Streptococcus pneumoniae NOT DETECTED NOT DETECTED   Streptococcus pyogenes NOT DETECTED NOT DETECTED   A.calcoaceticus-baumannii NOT DETECTED NOT DETECTED   Bacteroides fragilis NOT DETECTED NOT DETECTED   Enterobacterales NOT DETECTED NOT DETECTED   Enterobacter cloacae complex NOT DETECTED NOT DETECTED   Escherichia coli NOT DETECTED NOT DETECTED   Klebsiella aerogenes NOT DETECTED NOT DETECTED   Klebsiella oxytoca NOT DETECTED NOT DETECTED   Klebsiella pneumoniae NOT DETECTED NOT DETECTED   Proteus species NOT DETECTED NOT DETECTED   Salmonella species NOT DETECTED NOT DETECTED   Serratia marcescens NOT DETECTED NOT DETECTED   Haemophilus influenzae NOT DETECTED NOT DETECTED   Neisseria meningitidis NOT DETECTED NOT DETECTED   Pseudomonas aeruginosa NOT DETECTED NOT DETECTED   Stenotrophomonas maltophilia NOT DETECTED NOT DETECTED   Candida albicans NOT DETECTED NOT DETECTED   Candida auris NOT DETECTED NOT DETECTED   Candida glabrata NOT DETECTED NOT DETECTED   Candida krusei NOT DETECTED NOT DETECTED   Candida parapsilosis NOT DETECTED NOT DETECTED   Candida tropicalis NOT DETECTED NOT DETECTED   Cryptococcus neoformans/gattii NOT DETECTED NOT DETECTED    Vancomycin resistance NOT DETECTED NOT DETECTED    Comment: Performed at [facility], [place] [address], [place], [place] [address]  Magnesium     Status: Abnormal   Collection Time: 08/19/22 12:12 PM  Result Value Ref Range   Magnesium 1.5 (L) 1.7 - 2.4 mg/dL    Comment: Performed at [facility], [address], [place], [place] [address]  Lactic acid, plasma     Status: Abnormal   Collection Time: 08/19/22  1:28 PM  Result Value Ref Range   Lactic Acid, Venous 3.2 (HH) 0.5 - 1.9 mmol/L    Comment: CRITICAL RESULT CALLED TO, READ BACK BY AND VERIFIED WITH: [place] '@1403'$  08/19/22 BY GMCGEEHON. Performed at [facility], [address] [place], [place] [address]   Urinalysis, Routine w reflex microscopic     Status: Abnormal   Collection Time: 08/19/22  1:56 PM  Result Value Ref Range   Color, Urine YELLOW YELLOW   APPearance HAZY (A) CLEAR   Specific Gravity, Urine 1.014 1.005 - 1.030   pH 5.0 5.0 - 8.0   Glucose, UA NEGATIVE NEGATIVE mg/dL   Hgb urine dipstick LARGE (A)  NEGATIVE   Bilirubin Urine NEGATIVE NEGATIVE   Ketones, ur NEGATIVE NEGATIVE mg/dL   Protein, ur NEGATIVE NEGATIVE mg/dL   Nitrite NEGATIVE NEGATIVE   Leukocytes,Ua SMALL (A) NEGATIVE   RBC / HPF >50 (H) 0 - 5 RBC/hpf   WBC, UA 21-50 0 - 5 WBC/hpf   Bacteria, UA RARE (A) NONE SEEN   Squamous Epithelial / LPF 0-5 0 - 5   Mucus PRESENT     Comment: Performed at [facility], [address], [place], [place] [address]  Lactic acid, plasma     Status: None   Collection Time: 08/19/22  7:18 PM  Result Value Ref Range   Lactic Acid, Venous 1.9 0.5 - 1.9 mmol/L    Comment: Performed at [facility], [address] [place], [place] [address]  Basic metabolic panel     Status: Abnormal   Collection Time: 08/20/22  4:53 AM  Result Value Ref Range   Sodium 138 135 - 145 mmol/L   Potassium 4.0 3.5 - 5.1 mmol/L   Chloride 111 98 - 111 mmol/L   CO2 24 22 - 32 mmol/L   Glucose, Bld 125 (H) 70 - 99 mg/dL     Comment: Glucose reference range applies only to samples taken after fasting for at least 8 hours.   BUN 17 8 - 23 mg/dL   Creatinine, Ser 1.28 (H) 0.61 - 1.24 mg/dL   Calcium 8.0 (L) 8.9 - 10.3 mg/dL   GFR, Estimated 58 (L) >60 mL/min    Comment: (NOTE) Calculated using the CKD-EPI Creatinine Equation (2021)    Anion gap 3 (L) 5 - 15    Comment: Performed at [facility], [address], [place], [place] [address]  CBC     Status: Abnormal   Collection Time: 08/20/22  4:53 AM  Result Value Ref Range   WBC 11.8 (H) 4.0 - 10.5 K/uL   RBC 3.59 (L) 4.22 - 5.81 MIL/uL   Hemoglobin 10.6 (L) 13.0 - 17.0 g/dL    Comment: DELTA CHECK NOTED   HCT 33.0 (L) 39.0 - 52.0 %   MCV 91.9 80.0 - 100.0 fL   MCH 29.5 26.0 - 34.0 pg   MCHC 32.1 30.0 - 36.0 g/dL   RDW 14.6 11.5 - 15.5 %   Platelets 150 150 - 400 K/uL   nRBC 0.0 0.0 - 0.2 %    Comment: Performed at [facility], [address], [place], [place] [address]  Magnesium     Status: None   Collection Time: 08/20/22  4:53 AM  Result Value Ref Range   Magnesium 1.9 1.7 - 2.4 mg/dL    Comment: Performed at [facility], [address], [place], [place] [address]  Hepatic function panel     Status: Abnormal   Collection Time: 08/20/22 11:20 AM  Result Value Ref Range   Total Protein 5.3 (L) 6.5 - 8.1 g/dL   Albumin 2.7 (L) 3.5 - 5.0 g/dL   AST 71 (H) 15 - 41 U/L   ALT 73 (H) 0 - 44 U/L   Alkaline Phosphatase 99 38 - 126 U/L   Total Bilirubin 0.7 0.3 - 1.2 mg/dL   Bilirubin, Direct 0.2 0.0 - 0.2 mg/dL   Indirect Bilirubin 0.5 0.3 - 0.9 mg/dL    Comment: Performed at [facility], [address], [place], [place] [address]  Culture, blood (Routine X 2) w Reflex to ID Panel     Status: None (Preliminary result)   Collection Time: 08/20/22  3:54 PM   Specimen: BLOOD LEFT HAND  Result Value Ref Range   Specimen Description      BLOOD LEFT HAND BOTTLES DRAWN AEROBIC AND ANAEROBIC   Special Requests      Blood Culture adequate volume Performed  at [facility], [address] [place], [place] [address]    Culture PENDING    Report Status PENDING   Culture, blood (Routine X 2) w Reflex to ID Panel     Status: None (Preliminary result)   Collection Time: 08/20/22  3:54 PM   Specimen: BLOOD RIGHT HAND  Result Value Ref Range   Specimen Description      BLOOD RIGHT HAND BOTTLES DRAWN AEROBIC AND ANAEROBIC   Special Requests      Blood Culture adequate volume Performed at [facility], [address] [place], [place] [address]    Culture PENDING    Report Status PENDING   MRSA Next Gen by PCR, Nasal     Status: None   Collection Time: 08/20/22  4:46 PM   Specimen: Nasal Mucosa; Nasal Swab  Result Value Ref Range   MRSA by PCR Next Gen NOT DETECTED NOT DETECTED    Comment: (NOTE) The GeneXpert MRSA Assay (FDA approved for NASAL specimens only), is one component of a comprehensive MRSA colonization surveillance program. It is not intended to diagnose MRSA infection nor to guide or monitor treatment for MRSA infections. Test performance is not FDA approved in patients less than 6 years old. Performed at [facility], [address], [place], [place] [address]   CBC with Differential/Platelet     Status: Abnormal   Collection Time: 08/21/22  4:42 AM  Result Value Ref Range   WBC 7.6 4.0 - 10.5 K/uL   RBC 3.50 (L) 4.22 - 5.81 MIL/uL   Hemoglobin 10.3 (L) 13.0 - 17.0 g/dL   HCT 32.2 (L) 39.0 - 52.0 %   MCV 92.0 80.0 - 100.0 fL   MCH 29.4 26.0 - 34.0 pg   MCHC 32.0 30.0 - 36.0 g/dL   RDW 14.6 11.5 - 15.5 %   Platelets 130 (L) 150 - 400 K/uL   nRBC 0.0 0.0 - 0.2 %   Neutrophils Relative % 76 %   Neutro Abs 5.7 1.7 - 7.7 K/uL   Lymphocytes Relative 14 %   Lymphs Abs 1.0 0.7 - 4.0 K/uL   Monocytes Relative 10 %   Monocytes Absolute 0.8 0.1 - 1.0 K/uL   Eosinophils Relative 0 %   Eosinophils Absolute 0.0 0.0 - 0.5 K/uL   Basophils Relative 0 %   Basophils Absolute 0.0 0.0 - 0.1 K/uL   Immature Granulocytes 0 %   Abs  Immature Granulocytes 0.03 0.00 - 0.07 K/uL    Comment: Performed at [facility], [address], [place],  [address]  Comprehensive metabolic panel     Status: Abnormal   Collection Time: 08/21/22  4:42 AM  Result Value Ref Range   Sodium 140 135 - 145 mmol/L   Potassium 3.5 3.5 - 5.1 mmol/L   Chloride 111 98 - 111 mmol/L   CO2 25 22 - 32 mmol/L   Glucose, Bld 121 (H) 70 - 99 mg/dL    Comment: Glucose reference range applies only to samples taken after fasting for at least 8 hours.   BUN 12 8 - 23 mg/dL   Creatinine, Ser 0.78 0.61 - 1.24 mg/dL   Calcium 8.1 (L) 8.9 - 10.3 mg/dL   Total Protein 5.2 (L)  6.5 - 8.1 g/dL   Albumin 2.7 (L) 3.5 - 5.0 g/dL   AST 71 (H) 15 - 41 U/L   ALT 79 (H) 0 - 44 U/L   Alkaline Phosphatase 92 38 - 126 U/L   Total Bilirubin 0.9 0.3 - 1.2 mg/dL   GFR, Estimated >60 >60 mL/min    Comment: (NOTE) Calculated using the CKD-EPI Creatinine Equation (2021)    Anion gap 4 (L) 5 - 15    Comment: Performed at [facility], [address], [place], [place] [address]  Magnesium     Status: None   Collection Time: 08/21/22  4:42 AM  Result Value Ref Range   Magnesium 1.9 1.7 - 2.4 mg/dL    Comment: Performed at [facility], [address], [place], [place] [address]    US Abdomen Limited RUQ (LIVER/GB)  Result Date: 08/20/2022 CLINICAL DATA:  Cholelithiasis. EXAM: ULTRASOUND ABDOMEN LIMITED RIGHT UPPER QUADRANT COMPARISON:  CT scan of August 19, 2022. Ultrasound of June 26, 2022. FINDINGS: Gallbladder: Cholelithiasis is noted with severe gallbladder wall thickening measuring 13 mm. No sonographic Murphy's sign is noted. Moderate amount of pericholecystic fluid is noted. Common bile duct: Diameter: 4 mm which is within normal limits. Liver: No focal lesion identified. Within normal limits in parenchymal echogenicity. Portal vein is patent on color Doppler imaging with normal direction of blood flow towards the liver. Other: Minimal ascites is noted.  IMPRESSION: Cholelithiasis is noted with severe gallbladder wall thickening and pericholecystic fluid highly concerning for acute cholecystitis. These results will be called to the ordering clinician or representative by the Radiologist Assistant, and communication documented in the PACS or zVision Dashboard. Electronically Signed   By: Marijo Conception M.D.   On: 08/20/2022 10:48   DG CHEST PORT 1 VIEW  Result Date: 08/20/2022 CLINICAL DATA:  Difficulty breathing.  Hypoxia. EXAM: PORTABLE CHEST 1 VIEW COMPARISON:  August 19, 2022. FINDINGS: Stable cardiomegaly. Sternotomy wires are noted. Mild bibasilar subsegmental atelectasis is noted. Bony thorax is unremarkable. IMPRESSION: Mild bibasilar subsegmental atelectasis. Electronically Signed   By: Marijo Conception M.D.   On: 08/20/2022 09:53   CT ABDOMEN PELVIS W CONTRAST  Result Date: 08/19/2022 CLINICAL DATA:  Sepsis. Vomiting. [age] year. Patient reports surgery 1 month ago on penis. EXAM: CT ABDOMEN AND PELVIS WITH CONTRAST TECHNIQUE: Multidetector CT imaging of the abdomen and pelvis was performed using the standard protocol following bolus administration of intravenous contrast. RADIATION DOSE REDUCTION: This exam was performed according to the departmental dose-optimization program which includes automated exposure control, adjustment of the mA and/or kV according to patient size and/or use of iterative reconstruction technique. CONTRAST:  131m OMNIPAQUE IOHEXOL 350 MG/ML SOLN COMPARISON:  Abdominopelvic CT 02/06/2022 FINDINGS: Lower chest: Dependent atelectasis with trace bilateral pleural thickening, no significant effusion. Cardiomegaly with coronary artery calcifications. Prior median sternotomy. Hepatobiliary: There is no focal hepatic lesion. Gallbladder is distended. Patient motion artifact through the gallbladder limits assessment for pericholecystic inflammation. There is no definite wall thickening. 15 mm rounded high-density in the  gallbladder likely represent gallstones, with compared with 06/26/2022 abdominal ultrasound. There is diffuse periportal edema. No biliary dilatation. Pancreas: No ductal dilatation or inflammation. Spleen: Tiny subcentimeter hypodensity in the lower spleen, nonspecific. Spleen is normal in size. Adrenals/Urinary Tract: Normal adrenal glands. Mild prominence of the right renal collecting system without frank hydronephrosis. Mild thinning of bilateral renal parenchyma calcification in the right renal hilum is felt to be vascular rather than nonobstructing stone. Elongated urinary bladder which  extends to the level of the umbilicus. No bladder wall thickening. Stomach/Bowel: No bowel obstruction or inflammatory change. Moderate volume of colonic stool. There is stool distending the rectum. No rectal or colonic wall thickening. Stomach is decompressed. Appendix not visualized. Vascular/Lymphatic: Aortic and branch atherosclerosis. No aortic aneurysm. Patent portal vein. Prominent left inguinal node measures 10 mm short axis. This is slightly more prominent than on prior exam. Reproductive: Surgical clips in the prostate which is prominent size. No evidence of prostatic fluid collection. The included portion of the penis and urethra are unremarkable. Other: No ascites. No free air. Tiny fat containing umbilical hernia. Minimal fat in the left inguinal canal. Musculoskeletal: Postsurgical change in the lumbar spine. Spinal stimulator in place. No focal bone lesions. IMPRESSION: 1. Distended gallbladder with gallstones. Patient motion artifact through the gallbladder limits assessment for pericholecystic inflammation. Consider right upper quadrant ultrasound for further evaluation as clinically indicated. 2. Moderate volume of colonic stool with stool distending the rectum, suggesting constipation. 3. Elongated urinary bladder which extends to the level of the umbilicus, can be seen with urinary retention. No bladder wall  thickening. 4. Prominent left inguinal node is slightly more prominent than on prior exam, likely reactive. 5. Additional chronic findings as described. Aortic Atherosclerosis (ICD10-I70.0). Electronically Signed   By: Keith Rake M.D.   On: 08/19/2022 16:44   DG Chest Port 1 View  Result Date: 08/19/2022 CLINICAL DATA:  Fever.  Recent surgery. EXAM: PORTABLE CHEST 1 VIEW COMPARISON:  01/01/2021. FINDINGS: Stable changes from prior cardiac surgery. No mediastinal or hilar masses. Clear lungs.  No pleural effusion or pneumothorax. Skeletal structures are grossly intact. IMPRESSION: No active disease. Electronically Signed   By: Lajean Manes M.D.   On: 08/19/2022 12:30    ROS:  Pertinent items are noted in HPI.  Blood pressure (!) 113/42, pulse (!) 52, temperature 98 F (36.7 C), temperature source Oral, resp. rate (!) 25, height '5\' 8"'$  (1.727 m), weight 75.2 kg, SpO2 92 %. Physical Exam: Pleasant white male in no acute distress Head is normocephalic, atraumatic Eyes are without scleral icterus Lungs clear to auscultation with good breath sounds bilaterally Heart examination reveals a bradycardic regular rhythm. Abdomen is soft with tenderness in the epigastric and right upper quadrant regions.  No masses are noted.  No rigidity is noted.  Assessment/Plan: Impression: Enterococcus bacteremia, probable urinary source.  Does have symptoms of cholecystitis secondary to cholelithiasis.  Do not know whether this is a primary or secondary event.  Cardiology consultation pending.  No need for acute surgical intervention at this time.  Will get HIDA scan to fully assess.  Options during this admission include cholecystectomy or percutaneous cholecystostomy tube.  Would continue holding Eliquis.  Continue Unasyn as ordered.  Discussed with Dr. Wynetta Emery.  Aviva Signs 08/21/2022, 8:34 AM

## 2022-08-21 NOTE — Progress Notes (Signed)
    [facility]   Reason for visit: Follow up on bacteremia  Interval History: repeat blood cultures sent, ngtd; HIDA scan normal.    Physical Exam: Constitutional:  Vitals:   08/21/22 1515 08/21/22 1540  BP: 128/75 128/75  Pulse: 82 (!) 121  Resp: (!) 24 18  Temp:  98.9 F (37.2 C)  SpO2: 93% 94%   patient appears in NAD  Impression: bacteremia.  Repeat culture no growth so far.  On amp/sulbactam with concern for cholecystitis as well but normal.  LFTs stable.    Plan: 1.  No changes, continue with amp/sulbactam.  If blood cultures remain negative, will plan on two weeks of oral amoxicillin, high dose.

## 2022-08-22 ENCOUNTER — Ambulatory Visit: Payer: Medicare Other | Admitting: Urology

## 2022-08-22 DIAGNOSIS — N320 Bladder-neck obstruction: Secondary | ICD-10-CM | POA: Diagnosis not present

## 2022-08-22 DIAGNOSIS — A419 Sepsis, unspecified organism: Secondary | ICD-10-CM

## 2022-08-22 DIAGNOSIS — K8020 Calculus of gallbladder without cholecystitis without obstruction: Secondary | ICD-10-CM

## 2022-08-22 DIAGNOSIS — R7881 Bacteremia: Secondary | ICD-10-CM | POA: Diagnosis not present

## 2022-08-22 DIAGNOSIS — I480 Paroxysmal atrial fibrillation: Secondary | ICD-10-CM

## 2022-08-22 LAB — ECHOCARDIOGRAM COMPLETE
AR max vel: 2.97 cm2
AV Area VTI: 2.71 cm2
AV Area mean vel: 2.77 cm2
AV Mean grad: 3 mmHg
AV Peak grad: 6.1 mmHg
Ao pk vel: 1.24 m/s
Area-P 1/2: 3.06 cm2
Calc EF: 61.4 %
Height: 68 in
S' Lateral: 2.8 cm
Single Plane A2C EF: 63.1 %
Single Plane A4C EF: 61.2 %
Weight: 2656.1 oz

## 2022-08-22 LAB — CBC WITH DIFFERENTIAL/PLATELET
Abs Immature Granulocytes: 0.02 10*3/uL (ref 0.00–0.07)
Basophils Absolute: 0 10*3/uL (ref 0.0–0.1)
Basophils Relative: 0 %
Eosinophils Absolute: 0.1 10*3/uL (ref 0.0–0.5)
Eosinophils Relative: 1 %
HCT: 31.2 % — ABNORMAL LOW (ref 39.0–52.0)
Hemoglobin: 10.2 g/dL — ABNORMAL LOW (ref 13.0–17.0)
Immature Granulocytes: 0 %
Lymphocytes Relative: 19 %
Lymphs Abs: 1.2 10*3/uL (ref 0.7–4.0)
MCH: 29.4 pg (ref 26.0–34.0)
MCHC: 32.7 g/dL (ref 30.0–36.0)
MCV: 89.9 fL (ref 80.0–100.0)
Monocytes Absolute: 0.7 10*3/uL (ref 0.1–1.0)
Monocytes Relative: 11 %
Neutro Abs: 4.4 10*3/uL (ref 1.7–7.7)
Neutrophils Relative %: 69 %
Platelets: 139 10*3/uL — ABNORMAL LOW (ref 150–400)
RBC: 3.47 MIL/uL — ABNORMAL LOW (ref 4.22–5.81)
RDW: 14.3 % (ref 11.5–15.5)
WBC: 6.5 10*3/uL (ref 4.0–10.5)
nRBC: 0 % (ref 0.0–0.2)

## 2022-08-22 LAB — COMPREHENSIVE METABOLIC PANEL
ALT: 62 U/L — ABNORMAL HIGH (ref 0–44)
AST: 49 U/L — ABNORMAL HIGH (ref 15–41)
Albumin: 2.5 g/dL — ABNORMAL LOW (ref 3.5–5.0)
Alkaline Phosphatase: 85 U/L (ref 38–126)
Anion gap: 4 — ABNORMAL LOW (ref 5–15)
BUN: 6 mg/dL — ABNORMAL LOW (ref 8–23)
CO2: 27 mmol/L (ref 22–32)
Calcium: 8 mg/dL — ABNORMAL LOW (ref 8.9–10.3)
Chloride: 108 mmol/L (ref 98–111)
Creatinine, Ser: 0.6 mg/dL — ABNORMAL LOW (ref 0.61–1.24)
GFR, Estimated: >60 mL/min (ref >60)
Glucose, Bld: 95 mg/dL (ref 70–99)
Potassium: 3.1 mmol/L — ABNORMAL LOW (ref 3.5–5.1)
Sodium: 139 mmol/L (ref 135–145)
Total Bilirubin: 1 mg/dL (ref 0.3–1.2)
Total Protein: 5 g/dL — ABNORMAL LOW (ref 6.5–8.1)

## 2022-08-22 LAB — URINE CULTURE: Culture: >=100000 — AB

## 2022-08-22 LAB — CULTURE, BLOOD (ROUTINE X 2): Special Requests: ADEQUATE

## 2022-08-22 MED ORDER — ORAL CARE MOUTH RINSE
15.0000 mL | OROMUCOSAL | Status: DC | PRN
Start: 2022-08-22 — End: 2022-08-24

## 2022-08-22 MED ORDER — AMOXICILLIN 250 MG PO CAPS
1000.0000 mg | ORAL_CAPSULE | Freq: Three times a day (TID) | ORAL | Status: DC
  Administered 2022-08-22 – 2022-08-24 (×6): 1000 mg via ORAL
  Filled 2022-08-22 (×6): qty 4

## 2022-08-22 MED ORDER — POTASSIUM CHLORIDE CRYS ER 20 MEQ PO TBCR
40.0000 meq | EXTENDED_RELEASE_TABLET | Freq: Once | ORAL | Status: AC
Start: 2022-08-22 — End: 2022-08-22
  Administered 2022-08-22: 40 meq via ORAL
  Filled 2022-08-22: qty 2

## 2022-08-22 NOTE — Progress Notes (Signed)
    [name]   Reason for visit: Follow up on bacteremia  Interval History: repeat cultures remain ngtd  Physical Exam: Constitutional:  Vitals:   08/22/22 1300 08/22/22 1400  BP: (!) 153/71 (!) 136/55  Pulse:  (!) 50  Resp: 15 (!) 23  Temp:    SpO2:  97%   patient appears in NAD  Impression: no changes.    Plan: 1.  Will go ahead and transition him to oral amoxicillin.    ID team will sign off.

## 2022-08-22 NOTE — Progress Notes (Signed)
Subjective: Patient sitting up eating.  Denies any significant right upper quadrant abdominal pain.  Objective: Vital signs in last 24 hours: Temp:  [98.4 F (36.9 C)-99.5 F (37.5 C)] 98.7 F (37.1 C) (09/06 0000) Pulse Rate:  [47-121] 100 (09/06 0800) Resp:  [11-28] 28 (09/06 0800) BP: (95-149)/(40-76) 149/76 (09/06 0800) SpO2:  [80 %-98 %] 80 % (09/06 0800) Weight:  [79 kg] 79 kg (09/06 0600) Last BM Date : 08/19/22  Intake/Output from previous day: 09/05 0701 - 09/06 0700 In: 709.4 [IV Piggyback:709.4] Out: 700 [Urine:700] Intake/Output this shift: Total I/O In: 580 [P.O.:480; IV Piggyback:100] Out: -   General appearance: alert, cooperative, and no distress GI: soft, non-tender; bowel sounds normal; no masses,  no organomegaly  Lab Results:  Recent Labs    08/21/22 0442 08/22/22 0429  WBC 7.6 6.5  HGB 10.3* 10.2*  HCT 32.2* 31.2*  PLT 130* 139*   BMET Recent Labs    08/21/22 0442 08/22/22 0429  NA 140 139  K 3.5 3.1*  CL 111 108  CO2 25 27  GLUCOSE 121* 95  BUN 12 6*  CREATININE 0.78 0.60*  CALCIUM 8.1* 8.0*   PT/INR Recent Labs    08/19/22 1208  LABPROT 15.3*  INR 1.2    Studies/Results: ECHOCARDIOGRAM COMPLETE  Result Date: 08/22/2022    ECHOCARDIOGRAM REPORT   Patient Name:   [name] Date of Exam: 08/20/2022 Medical Rec #:  [id number]     Height:       68.0 in Accession #:    [id number]    Weight:       166.0 lb Date of Birth:  02-01-1946     BSA:          1.888 m Patient Age:    76 years      BP:           103/42 mmHg Patient Gender: M             HR:           45 bpm. Exam Location:  [facility] Procedure: 2D Echo, Cardiac Doppler and Color Doppler Indications:    Preoperative evaluation  History:        Patient has prior history of Echocardiogram examinations, most                 recent 12/25/2019. CAD, Arrythmias:Atrial Fibrillation; Risk                 Factors:Sleep Apnea and GERD.  Sonographer:    Bernadene Person RDCS Referring Phys:  [place]  1. Left ventricular ejection fraction, by estimation, is 55 to 60%. The left ventricle has normal function. The left ventricle has no regional wall motion abnormalities. Left ventricular diastolic parameters are consistent with Grade II diastolic dysfunction (pseudonormalization). Elevated left atrial pressure.  2. Right ventricular systolic function is normal. The right ventricular size is mildly enlarged. There is mildly elevated pulmonary artery systolic pressure.  3. Left atrial size was moderately dilated.  4. Right atrial size was moderately dilated.  5. The mitral valve is normal in structure. No evidence of mitral valve regurgitation. No evidence of mitral stenosis.  6. The aortic valve has an indeterminant number of cusps. Aortic valve regurgitation is not visualized. No aortic stenosis is present.  7. The inferior vena cava is dilated in size with <50% respiratory variability, suggesting right atrial pressure of 15 mmHg. FINDINGS  Left Ventricle: Left ventricular ejection fraction,  by estimation, is 55 to 60%. The left ventricle has normal function. The left ventricle has no regional wall motion abnormalities. The left ventricular internal cavity size was normal in size. There is  no left ventricular hypertrophy. Left ventricular diastolic parameters are consistent with Grade II diastolic dysfunction (pseudonormalization). Elevated left atrial pressure. Right Ventricle: The right ventricular size is mildly enlarged. Right vetricular wall thickness was not well visualized. Right ventricular systolic function is normal. There is mildly elevated pulmonary artery systolic pressure. The tricuspid regurgitant  velocity is 2.64 m/s, and with an assumed right atrial pressure of 15 mmHg, the estimated right ventricular systolic pressure is 03.8 mmHg. Left Atrium: Left atrial size was moderately dilated. Right Atrium: Right atrial size was moderately dilated. Pericardium: There  is no evidence of pericardial effusion. Mitral Valve: The mitral valve is normal in structure. No evidence of mitral valve regurgitation. No evidence of mitral valve stenosis. Tricuspid Valve: The tricuspid valve is not well visualized. Tricuspid valve regurgitation is mild . No evidence of tricuspid stenosis. Aortic Valve: The aortic valve has an indeterminant number of cusps. Aortic valve regurgitation is not visualized. No aortic stenosis is present. Aortic valve mean gradient measures 3.0 mmHg. Aortic valve peak gradient measures 6.1 mmHg. Aortic valve area, by VTI measures 2.71 cm. Pulmonic Valve: The pulmonic valve was not well visualized. Pulmonic valve regurgitation is mild. No evidence of pulmonic stenosis. Aorta: The aortic root is normal in size and structure. Venous: The inferior vena cava is dilated in size with less than 50% respiratory variability, suggesting right atrial pressure of 15 mmHg. IAS/Shunts: The interatrial septum was not well visualized.  LEFT VENTRICLE PLAX 2D LVIDd:         5.00 cm      Diastology LVIDs:         2.80 cm      LV e' medial:    5.25 cm/s LV PW:         0.90 cm      LV E/e' medial:  22.3 LV IVS:        1.00 cm      LV e' lateral:   7.50 cm/s LVOT diam:     2.10 cm      LV E/e' lateral: 15.6 LV SV:         90 LV SV Index:   48 LVOT Area:     3.46 cm  LV Volumes (MOD) LV vol d, MOD A2C: 96.1 ml LV vol d, MOD A4C: 106.0 ml LV vol s, MOD A2C: 35.5 ml LV vol s, MOD A4C: 41.1 ml LV SV MOD A2C:     60.6 ml LV SV MOD A4C:     106.0 ml LV SV MOD BP:      61.8 ml RIGHT VENTRICLE RV S prime:     8.93 cm/s TAPSE (M-mode): 1.4 cm LEFT ATRIUM             Index        RIGHT ATRIUM           Index LA diam:        4.70 cm 2.49 cm/m   RA Area:     27.10 cm LA Vol (A2C):   84.8 ml 44.91 ml/m  RA Volume:   92.40 ml  48.94 ml/m LA Vol (A4C):   84.6 ml 44.81 ml/m LA Biplane Vol: 87.3 ml 46.24 ml/m  AORTIC VALVE  PULMONIC VALVE AV Area (Vmax):    2.97 cm     PR End  Diast Vel: 4.08 msec AV Area (Vmean):   2.77 cm AV Area (VTI):     2.71 cm AV Vmax:           123.79 cm/s AV Vmean:          82.428 cm/s AV VTI:            0.331 m AV Peak Grad:      6.1 mmHg AV Mean Grad:      3.0 mmHg LVOT Vmax:         106.00 cm/s LVOT Vmean:        66.000 cm/s LVOT VTI:          0.259 m LVOT/AV VTI ratio: 0.78  AORTA Ao Root diam: 3.10 cm Ao Asc diam:  3.40 cm MITRAL VALVE                TRICUSPID VALVE MV Area (PHT): 3.06 cm     TR Peak grad:   27.9 mmHg MV Decel Time: 248 msec     TR Vmax:        264.00 cm/s MV E velocity: 117.00 cm/s MV A velocity: 26.20 cm/s   SHUNTS MV E/A ratio:  4.47         Systemic VTI:  0.26 m                             Systemic Diam: 2.10 cm Carlyle Dolly MD Electronically signed by Carlyle Dolly MD Signature Date/Time: 08/22/2022/8:23:43 AM    Final    NM Hepato W/EF  Result Date: 08/21/2022 CLINICAL DATA:  Cholelithiasis question cholecystitis EXAM: NUCLEAR MEDICINE HEPATOBILIARY IMAGING WITH GALLBLADDER EF TECHNIQUE: Sequential images of the abdomen were obtained out to 60 minutes following intravenous administration of radiopharmaceutical. After slow intravenous infusion of 1.5 micrograms Cholecystokinin, gallbladder ejection fraction was determined. RADIOPHARMACEUTICALS:  5.5 mCi Tc-74mCholetec IV COMPARISON:  Ultrasound abdomen 08/20/2022 FINDINGS: Normal tracer extraction from bloodstream indicating normal hepatocellular function. Normal excretion of tracer into biliary tree. Gallbladder visualized at 18 min. Small bowel visualized at 15 min. No hepatic retention of tracer. Subjectively normal emptying of tracer from gallbladder following CCK administration. Calculated gallbladder ejection fraction is 99%, normal. Patient reported no symptoms following CCK administration. Normal gallbladder ejection fraction following CCK stimulation is greater than 40% at 1 hour. IMPRESSION: Normal exam. Electronically Signed   By: MLavonia DanaM.D.   On: 08/21/2022  14:37    Anti-infectives: Anti-infectives (From admission, onward)    Start     Dose/Rate Route Frequency Ordered Stop   08/20/22 1400  Ampicillin-Sulbactam (UNASYN) 3 g in sodium chloride 0.9 % 100 mL IVPB        3 g 200 mL/hr over 30 Minutes Intravenous Every 6 hours 08/20/22 1250     08/20/22 1330  ampicillin (OMNIPEN) 2 g in sodium chloride 0.9 % 100 mL IVPB  Status:  Discontinued        2 g 300 mL/hr over 20 Minutes Intravenous Every 6 hours 08/20/22 1233 08/20/22 1250   08/20/22 1300  vancomycin (VANCOREADY) IVPB 1250 mg/250 mL  Status:  Discontinued        1,250 mg 166.7 mL/hr over 90 Minutes Intravenous Every 24 hours 08/19/22 1302 08/19/22 1740   08/20/22 1200  metroNIDAZOLE (FLAGYL) IVPB 500 mg  Status:  Discontinued  500 mg 100 mL/hr over 60 Minutes Intravenous Every 12 hours 08/20/22 1112 08/20/22 1250   08/20/22 0000  ceFEPIme (MAXIPIME) 2 g in sodium chloride 0.9 % 100 mL IVPB  Status:  Discontinued        2 g 200 mL/hr over 30 Minutes Intravenous Every 12 hours 08/19/22 1300 08/19/22 1740   08/20/22 0000  cefTRIAXone (ROCEPHIN) 2 g in sodium chloride 0.9 % 100 mL IVPB  Status:  Discontinued        2 g 200 mL/hr over 30 Minutes Intravenous Every 24 hours 08/19/22 1740 08/20/22 1233   08/19/22 1230  vancomycin (VANCOREADY) IVPB 1500 mg/300 mL        1,500 mg 150 mL/hr over 120 Minutes Intravenous  Once 08/19/22 1213 08/19/22 1506   08/19/22 1215  ceFEPIme (MAXIPIME) 2 g in sodium chloride 0.9 % 100 mL IVPB        2 g 200 mL/hr over 30 Minutes Intravenous  Once 08/19/22 1211 08/19/22 1257   08/19/22 1215  metroNIDAZOLE (FLAGYL) IVPB 500 mg        500 mg 100 mL/hr over 60 Minutes Intravenous  Once 08/19/22 1211 08/19/22 1337   08/19/22 1215  vancomycin (VANCOCIN) IVPB 1000 mg/200 mL premix  Status:  Discontinued        1,000 mg 200 mL/hr over 60 Minutes Intravenous  Once 08/19/22 1211 08/19/22 1213       Assessment/Plan: Impression: Enterococcus bacteremia.   HIDA scan shows no evidence of acute cholecystitis.  Cystic duct patent.  Ejection fraction normal.  I doubt the gallbladder was the source.  His ultrasound report may be a result of the bacteremia from the urosepsis.  No need for cholecystectomy or cholecystostomy tube placement. Plan: Patient has already been advanced on his diet.  May restart p.o. Eliquis from the surgery standpoint.  We will sign off.  Please call me if I be of further assistance.  LOS: 3 days    Aviva Signs 08/22/2022

## 2022-08-22 NOTE — Progress Notes (Signed)
PROGRESS NOTE  Christopher [name] GGY:[id number] DOB: 1946-02-14 DOA: 08/19/2022 PCP: [name], MD  Brief History:  76 y.o. male with medical history significant for gastric ulcer, coronary artery disease, BPH, atrial fibrillation on chronic anticoagulation. Patient presented to the ED with complaints of bleeding from his urethra today after self cath.  Patient had a UroLift procedure- 08/01/22 by Dr. [name].  Since then he has been having to self cath every other day, on the days he does not self cath, he reports normal urine.  Reports 2 days ago, he was having difficulty inserting the Foley catheter and he had pain but he was able to pass a true.  Yesterday he had he believes normal amount of urine, without blood.  Today he had a little bit of pain again, when he was self cathing, but subsequently started seeing blood, with clots.  He reports he tried milking his penis, get out the blood, and suddenly a jet of blood came out from his penis onto the wall with blood clots.  He reports some pain in his lower abdomen prior to this, but after the blood came out, he had relief. Reports 2 episodes of vomiting today, he otherwise denies difficulty breathing, no cough, no diarrhea.,  No pain or redness of his extremities. He is on Eliquis and has been compliant, his last dose of Eliquis was this morning   ED Course: Febrile to 103.  Heart rate 57-105.  Respirate rate 15-31.  Blood pressure systolic 03-500.  Lactic acidosis 4.9 > 3.2.  WBC 9.8.  UA with small leukocytes blood, 21-50 WBCs. 2.25 L sepsis fluid bolus given, maintenance of LR at 150.  Portable chest x-ray unremarkable.  Renal ultrasound could not be obtained so CT was ordered   CT abdomen and pelvis with contrast-distended gallbladder with gallstones, RUQ ultrasound recommended.  Moderate volume of stool, elongated urinary bladder which extends to the level of the umbilicus, seen with urinary retention, no bladder wall thickening.    Cefepime vancomycin and metronidazole started.     Assessment and Plan: * Severe sepsis due to Enterococcus Febrile to 103.   With significant lactic acidosis 4.9 > 3.2.  Likely urinary source of enterococcus infection -continue supportive measures -Follow-up blood and urine cultures -Trend lactic acid -sepsis physiology resolved with aggressive treatments and supportive measures  Enterococcus Bacteremia -- source = urine -- Appreciate ID consult -- 08/21/23 TTE EF 55-60%, no WMA, G2DD, mild TR, no veg -- if repeated BCs positive then would need a TEE per Dr. Gale Journey -- continue IV antibiotics and follow cultures  -- 9/4 blood cult--NGTD  Urinary tract infection UA suggestive with leukocytes, 21-50 WBCs.  Patient has been self cathing every other day.  He is status post UroLift procedure 8/16 by Dr. [name].  He had done well after this procedure until 2 days ago when he had difficulty and pain during self cath, and today gross hematuria with clots. -Recent urine cultures-pansensitive Enterococcus faecalis and Klebsiella oxytoca -CT abdomen and pelvis-showing distended urinary bladder suggesting urinary retention, distended gallbladder with stones -Mild liver enzyme elevation, AST 65, ALT 69, will obtain follow-up RUQ ultrasound -IV ceftriaxone 2 g daily -Follow-up urine cultures  Bladder outlet obstruction CT abdomen and pelvis with contrast - elongated urinary bladder which extends to the level of the umbilicus, can be seen with urinary retention. - Urology consultation appreciated -Inserted [place] foley catheter -Resume silodosin  Calculus of gallbladder without cholecystitis  without obstruction RUQ ultrasound--Cholelithiasis is noted with severe gallbladder wall thickening and pericholecystic fluid HIDA--neg -appreciate general surgery--discussed with Dr. Arnoldo Morale  Bradycardia -- HR as low as 30 up to mid 40s (primarily) when asleep -- pt asymptomatic -- we have held  amiodarone as a result of the bradycardia>>restarted 9/5  Hypomagnesemia Mag- 1.5. QTc at 450. He is on amiodarone but being held initially - Replete  Coronary artery disease involving native coronary artery of native heart with angina pectoris ([place]) --no chest pain -personally reviewed EKG--no concerning STT changes     Family Communication:   spouse updated at bedside 9/6  Consultants:  gen surgery; cardiology  Code Status:  FULL  DVT Prophylaxis:  apixaban on hold   Procedures: As Listed in Progress Note Above  Antibiotics: Unasyn 9/4>>      Subjective: Patient denies fevers, chills, headache, chest pain, dyspnea, nausea, vomiting, diarrhea, abdominal pain, dysuria, hematuria, hematochezia, and melena.   Objective: Vitals:   08/22/22 0700 08/22/22 0800 08/22/22 1048 08/22/22 1148  BP: (!) 126/47 (!) 149/76 (!) 129/50   Pulse: (!) 49 100 (!) 58   Resp: (!) 21 (!) 28 (!) 27   Temp:    99.2 F (37.3 C)  TempSrc:    Oral  SpO2: 94% (!) 80% 94%   Weight:      Height:        Intake/Output Summary (Last 24 hours) at 08/22/2022 1254 Last data filed at 08/22/2022 1002 Gross per 24 hour  Intake 1289.39 ml  Output 700 ml  Net 589.39 ml   Weight change: 3.7 kg Exam:  General:  Pt is alert, follows commands appropriately, not in acute distress HEENT: No icterus, No thrush, No neck mass, [place]/AT Cardiovascular: RRR, S1/S2, no rubs, no gallops Respiratory: CTA bilaterally, no wheezing, no crackles, no rhonchi Abdomen: Soft/+BS, non tender, non distended, no guarding Extremities: No edema, No lymphangitis, No petechiae, No rashes, no synovitis   Data Reviewed: I have personally reviewed following labs and imaging studies Basic Metabolic Panel: Recent Labs  Lab 08/19/22 1208 08/19/22 1212 08/20/22 0453 08/21/22 0442 08/22/22 0429  NA 140  --  138 140 139  K 3.9  --  4.0 3.5 3.1*  CL 107  --  111 111 108  CO2 23  --  '24 25 27  '$ GLUCOSE 100*  --  125* 121*  95  BUN 12  --  17 12 6*  CREATININE 1.07  --  1.28* 0.78 0.60*  CALCIUM 9.0  --  8.0* 8.1* 8.0*  MG  --  1.5* 1.9 1.9  --    Liver Function Tests: Recent Labs  Lab 08/19/22 1208 08/20/22 1120 08/21/22 0442 08/22/22 0429  AST 65* 71* 71* 49*  ALT 69* 73* 79* 62*  ALKPHOS 126 99 92 85  BILITOT 1.2 0.7 0.9 1.0  PROT 6.9 5.3* 5.2* 5.0*  ALBUMIN 3.7 2.7* 2.7* 2.5*   No results for input(s): "LIPASE", "AMYLASE" in the last 168 hours. No results for input(s): "AMMONIA" in the last 168 hours. Coagulation Profile: Recent Labs  Lab 08/19/22 1208  INR 1.2   CBC: Recent Labs  Lab 08/19/22 1208 08/20/22 0453 08/21/22 0442 08/22/22 0429  WBC 9.8 11.8* 7.6 6.5  NEUTROABS 8.6*  --  5.7 4.4  HGB 13.6 10.6* 10.3* 10.2*  HCT 42.6 33.0* 32.2* 31.2*  MCV 92.2 91.9 92.0 89.9  PLT 232 150 130* 139*   Cardiac Enzymes: No results for input(s): "CKTOTAL", "CKMB", "CKMBINDEX", "TROPONINI" in the  last 168 hours. BNP: Invalid input(s): "POCBNP" CBG: No results for input(s): "GLUCAP" in the last 168 hours. HbA1C: No results for input(s): "HGBA1C" in the last 72 hours. Urine analysis:    Component Value Date/Time   COLORURINE YELLOW 08/19/2022 1356   APPEARANCEUR HAZY (A) 08/19/2022 1356   APPEARANCEUR Cloudy (A) 08/01/2022 1133   LABSPEC 1.014 08/19/2022 1356   PHURINE 5.0 08/19/2022 1356   GLUCOSEU NEGATIVE 08/19/2022 1356   HGBUR LARGE (A) 08/19/2022 1356   BILIRUBINUR NEGATIVE 08/19/2022 1356   BILIRUBINUR Negative 08/01/2022 1133   KETONESUR NEGATIVE 08/19/2022 1356   PROTEINUR NEGATIVE 08/19/2022 1356   UROBILINOGEN 0.2 03/07/2014 2103   NITRITE NEGATIVE 08/19/2022 1356   LEUKOCYTESUR SMALL (A) 08/19/2022 1356   Sepsis Labs: '@LABRCNTIP'$ (procalcitonin:4,lacticidven:4) ) Recent Results (from the past 240 hour(s))  Resp Panel by RT-PCR (Flu A&B, Covid) Anterior Nasal Swab     Status: None   Collection Time: 08/19/22 12:02 PM   Specimen: Anterior Nasal Swab  Result Value  Ref Range Status   SARS Coronavirus 2 by RT PCR NEGATIVE NEGATIVE Final    Comment: (NOTE) SARS-CoV-2 target nucleic acids are NOT DETECTED.  The SARS-CoV-2 RNA is generally detectable in upper respiratory specimens during the acute phase of infection. The lowest concentration of SARS-CoV-2 viral copies this assay can detect is 138 copies/mL. A negative result does not preclude SARS-Cov-2 infection and should not be used as the sole basis for treatment or other patient management decisions. A negative result may occur with  improper specimen collection/handling, submission of specimen other than nasopharyngeal swab, presence of viral mutation(s) within the areas targeted by this assay, and inadequate number of viral copies(<138 copies/mL). A negative result must be combined with clinical observations, patient history, and epidemiological information. The expected result is Negative.  Fact Sheet for Patients:  EntrepreneurPulse.com.au  Fact Sheet for Healthcare Providers:  IncredibleEmployment.be  This test is no t yet approved or cleared by the [place] FDA and  has been authorized for detection and/or diagnosis of SARS-CoV-2 by FDA under an Emergency Use Authorization (EUA). This EUA will remain  in effect (meaning this test can be used) for the duration of the COVID-19 declaration under Section 564(b)(1) of the Act, 21 U.S.C.section 360bbb-3(b)(1), unless the authorization is terminated  or revoked sooner.       Influenza A by PCR NEGATIVE NEGATIVE Final   Influenza B by PCR NEGATIVE NEGATIVE Final    Comment: (NOTE) The Xpert Xpress SARS-CoV-2/FLU/RSV plus assay is intended as an aid in the diagnosis of influenza from Nasopharyngeal swab specimens and should not be used as a sole basis for treatment. Nasal washings and aspirates are unacceptable for Xpert Xpress SARS-CoV-2/FLU/RSV testing.  Fact Sheet for  Patients: EntrepreneurPulse.com.au  Fact Sheet for Healthcare Providers: IncredibleEmployment.be  This test is not yet approved or cleared by the [place] FDA and has been authorized for detection and/or diagnosis of SARS-CoV-2 by FDA under an Emergency Use Authorization (EUA). This EUA will remain in effect (meaning this test can be used) for the duration of the COVID-19 declaration under Section 564(b)(1) of the Act, 21 U.S.C. section 360bbb-3(b)(1), unless the authorization is terminated or revoked.  Performed at [facility], [address], [place], [place] [address]   Culture, blood (Routine x 2)     Status: Abnormal   Collection Time: 08/19/22 12:07 PM   Specimen: Right Antecubital; Blood  Result Value Ref Range Status   Specimen Description   Final    RIGHT  ANTECUBITAL BOTTLES DRAWN AEROBIC AND ANAEROBIC Performed at [facility], [address] [place], [place] [address]    Special Requests   Final    Blood Culture adequate volume Performed at [facility], [address], [place], [place] [address]    Culture  Setup Time   Final    GRAM POSITIVE COCCI Gram Stain Report Called to,Read Back By and Verified With: M. Sutherland @ 2683 BY STEPHTR 08/20/22 AEROBIC BOTTLE ONLY CRITICAL VALUE NOTED.  VALUE IS CONSISTENT WITH PREVIOUSLY REPORTED AND CALLED VALUE.    Culture (A)  Final    ENTEROCOCCUS FAECALIS SUSCEPTIBILITIES PERFORMED ON PREVIOUS CULTURE WITHIN THE LAST 5 DAYS. Performed at [facility], [place] [address] [place], [place] [address]    Report Status 08/21/2022 FINAL  Final  Culture, blood (Routine x 2)     Status: Abnormal   Collection Time: 08/19/22 12:08 PM   Specimen: Left Antecubital; Blood  Result Value Ref Range Status   Specimen Description   Final    LEFT ANTECUBITAL BOTTLES DRAWN AEROBIC AND ANAEROBIC Performed at [facility], [address] [place], [place] [address]    Special Requests   Final    Blood  Culture adequate volume Performed at [facility], [address] [place], [place] [address]    Culture  Setup Time   Final    GRAM POSITIVE COCCI Gram Stain Report Called to,Read Back By and Verified With: M. FAULK @ 0812 BY STEPHTR 08/20/22 AEROBIC BOTTLE ONLY CRITICAL RESULT CALLED TO, READ BACK BY AND VERIFIED WITH: PHARMD S [place] [id number] AT 1207 BY CM GRAM POSITIVE COCCI ANAEROBIC BOTTLE ONLY CRITICAL VALUE NOTED.  VALUE IS CONSISTENT WITH PREVIOUSLY REPORTED AND CALLED VALUE. Performed at [facility], [address] [place], [place] [address]    Culture ENTEROCOCCUS FAECALIS (A)  Final   Report Status 08/22/2022 FINAL  Final   Organism ID, Bacteria ENTEROCOCCUS FAECALIS  Final      Susceptibility   Enterococcus faecalis - MIC*    AMPICILLIN <=2 SENSITIVE Sensitive     VANCOMYCIN 1 SENSITIVE Sensitive     GENTAMICIN SYNERGY SENSITIVE Sensitive     * ENTEROCOCCUS FAECALIS  Blood Culture ID Panel (Reflexed)     Status: Abnormal   Collection Time: 08/19/22 12:08 PM  Result Value Ref Range Status   Enterococcus faecalis DETECTED (A) NOT DETECTED Final    Comment: CRITICAL RESULT CALLED TO, READ BACK BY AND VERIFIED WITH: PHARMD S [place] [id number] AT 1207 BY CM    Enterococcus Faecium NOT DETECTED NOT DETECTED Final   Listeria monocytogenes NOT DETECTED NOT DETECTED Final   Staphylococcus species NOT DETECTED NOT DETECTED Final   Staphylococcus aureus (BCID) NOT DETECTED NOT DETECTED Final   Staphylococcus epidermidis NOT DETECTED NOT DETECTED Final   Staphylococcus lugdunensis NOT DETECTED NOT DETECTED Final   Streptococcus species NOT DETECTED NOT DETECTED Final   Streptococcus agalactiae NOT DETECTED NOT DETECTED Final   Streptococcus pneumoniae NOT DETECTED NOT DETECTED Final   Streptococcus pyogenes NOT DETECTED NOT DETECTED Final   A.calcoaceticus-baumannii NOT DETECTED NOT DETECTED Final   Bacteroides fragilis NOT DETECTED NOT DETECTED Final   Enterobacterales NOT DETECTED  NOT DETECTED Final   Enterobacter cloacae complex NOT DETECTED NOT DETECTED Final   Escherichia coli NOT DETECTED NOT DETECTED Final   Klebsiella aerogenes NOT DETECTED NOT DETECTED Final   Klebsiella oxytoca NOT DETECTED NOT DETECTED Final   Klebsiella pneumoniae NOT DETECTED NOT DETECTED Final   Proteus species NOT DETECTED  NOT DETECTED Final   Salmonella species NOT DETECTED NOT DETECTED Final   Serratia marcescens NOT DETECTED NOT DETECTED Final   Haemophilus influenzae NOT DETECTED NOT DETECTED Final   Neisseria meningitidis NOT DETECTED NOT DETECTED Final   Pseudomonas aeruginosa NOT DETECTED NOT DETECTED Final   Stenotrophomonas maltophilia NOT DETECTED NOT DETECTED Final   Candida albicans NOT DETECTED NOT DETECTED Final   Candida auris NOT DETECTED NOT DETECTED Final   Candida glabrata NOT DETECTED NOT DETECTED Final   Candida krusei NOT DETECTED NOT DETECTED Final   Candida parapsilosis NOT DETECTED NOT DETECTED Final   Candida tropicalis NOT DETECTED NOT DETECTED Final   Cryptococcus neoformans/gattii NOT DETECTED NOT DETECTED Final   Vancomycin resistance NOT DETECTED NOT DETECTED Final    Comment: Performed at [facility], [address] [place], [place] [address]  Urine Culture     Status: Abnormal   Collection Time: 08/19/22  1:56 PM   Specimen: In/Out Cath Urine  Result Value Ref Range Status   Specimen Description   Final    IN/OUT CATH URINE Performed at [facility], [address] [place], [place] [address]    Special Requests   Final    NONE Performed at [facility], [address], [place], [place] [address]    Culture >=100,000 COLONIES/mL ENTEROCOCCUS FAECALIS (A)  Final   Report Status 08/22/2022 FINAL  Final   Organism ID, Bacteria ENTEROCOCCUS FAECALIS (A)  Final      Susceptibility   Enterococcus faecalis - MIC*    AMPICILLIN <=2 SENSITIVE Sensitive     NITROFURANTOIN <=16 SENSITIVE Sensitive     VANCOMYCIN 1 SENSITIVE Sensitive     *  >=100,000 COLONIES/mL ENTEROCOCCUS FAECALIS  Culture, blood (Routine X 2) w Reflex to ID Panel     Status: None (Preliminary result)   Collection Time: 08/20/22  3:54 PM   Specimen: BLOOD LEFT HAND  Result Value Ref Range Status   Specimen Description   Final    BLOOD LEFT HAND BOTTLES DRAWN AEROBIC AND ANAEROBIC   Special Requests Blood Culture adequate volume  Final   Culture   Final    NO GROWTH 2 DAYS Performed at [facility], [address] [place], [place] [address]    Report Status PENDING  Incomplete  Culture, blood (Routine X 2) w Reflex to ID Panel     Status: None (Preliminary result)   Collection Time: 08/20/22  3:54 PM   Specimen: BLOOD RIGHT HAND  Result Value Ref Range Status   Specimen Description   Final    BLOOD RIGHT HAND BOTTLES DRAWN AEROBIC AND ANAEROBIC   Special Requests Blood Culture adequate volume  Final   Culture   Final    NO GROWTH 2 DAYS Performed at [facility], [address] [place], [place] [address]    Report Status PENDING  Incomplete  MRSA Next Gen by PCR, Nasal     Status: None   Collection Time: 08/20/22  4:46 PM   Specimen: Nasal Mucosa; Nasal Swab  Result Value Ref Range Status   MRSA by PCR Next Gen NOT DETECTED NOT DETECTED Final    Comment: (NOTE) The GeneXpert MRSA Assay (FDA approved for NASAL specimens only), is one component of a comprehensive MRSA colonization surveillance program. It is not intended to diagnose MRSA infection nor to guide or monitor treatment for MRSA infections. Test performance is not FDA approved in patients less than 33 years old. Performed at [facility],  [address] [place], [place] [address]      Scheduled Meds:  ALPRAZolam  1 mg Oral QHS   amiodarone  200 mg Oral Daily   amoxicillin  1,000 mg Oral Q8H   atorvastatin  80 mg Oral Daily   Chlorhexidine Gluconate Cloth  6 each Topical Daily   finasteride  5 mg Oral Daily   gabapentin  200 mg Oral TID   lubiprostone  24 mcg Oral BID WC    pantoprazole  40 mg Oral BID   polyethylene glycol  17 g Oral BID   tamsulosin  0.4 mg Oral QPC supper   Continuous Infusions:  lactated ringers Stopped (08/20/22 1710)    Procedures/Studies: ECHOCARDIOGRAM COMPLETE  Result Date: 08/22/2022    ECHOCARDIOGRAM REPORT   Patient Name:   TRELLIS VANOVERBEKE Date of Exam: 08/20/2022 Medical Rec #:  [id number]     Height:       68.0 in Accession #:    [id number]    Weight:       166.0 lb Date of Birth:  [birth year]     BSA:          1.888 m Patient Age:    82 years      BP:           103/42 mmHg Patient Gender: M             HR:           45 bpm. Exam Location:  [facility] Procedure: 2D Echo, Cardiac Doppler and Color Doppler Indications:    Preoperative evaluation  History:        Patient has prior history of Echocardiogram examinations, most                 recent 12/25/2019. CAD, Arrythmias:Atrial Fibrillation; Risk                 Factors:Sleep Apnea and GERD.  Sonographer:    Bernadene Person RDCS Referring Phys: [place]  1. Left ventricular ejection fraction, by estimation, is 55 to 60%. The left ventricle has normal function. The left ventricle has no regional wall motion abnormalities. Left ventricular diastolic parameters are consistent with Grade II diastolic dysfunction (pseudonormalization). Elevated left atrial pressure.  2. Right ventricular systolic function is normal. The right ventricular size is mildly enlarged. There is mildly elevated pulmonary artery systolic pressure.  3. Left atrial size was moderately dilated.  4. Right atrial size was moderately dilated.  5. The mitral valve is normal in structure. No evidence of mitral valve regurgitation. No evidence of mitral stenosis.  6. The aortic valve has an indeterminant number of cusps. Aortic valve regurgitation is not visualized. No aortic stenosis is present.  7. The inferior vena cava is dilated in size with <50% respiratory variability, suggesting right atrial pressure of 15  mmHg. FINDINGS  Left Ventricle: Left ventricular ejection fraction, by estimation, is 55 to 60%. The left ventricle has normal function. The left ventricle has no regional wall motion abnormalities. The left ventricular internal cavity size was normal in size. There is  no left ventricular hypertrophy. Left ventricular diastolic parameters are consistent with Grade II diastolic dysfunction (pseudonormalization). Elevated left atrial pressure. Right Ventricle: The right ventricular size is mildly enlarged. Right vetricular wall thickness was not well visualized. Right ventricular systolic function is normal. There is mildly elevated pulmonary artery systolic pressure. The tricuspid regurgitant  velocity is 2.64 m/s, and with an assumed right atrial  pressure of 15 mmHg, the estimated right ventricular systolic pressure is 69.6 mmHg. Left Atrium: Left atrial size was moderately dilated. Right Atrium: Right atrial size was moderately dilated. Pericardium: There is no evidence of pericardial effusion. Mitral Valve: The mitral valve is normal in structure. No evidence of mitral valve regurgitation. No evidence of mitral valve stenosis. Tricuspid Valve: The tricuspid valve is not well visualized. Tricuspid valve regurgitation is mild . No evidence of tricuspid stenosis. Aortic Valve: The aortic valve has an indeterminant number of cusps. Aortic valve regurgitation is not visualized. No aortic stenosis is present. Aortic valve mean gradient measures 3.0 mmHg. Aortic valve peak gradient measures 6.1 mmHg. Aortic valve area, by VTI measures 2.71 cm. Pulmonic Valve: The pulmonic valve was not well visualized. Pulmonic valve regurgitation is mild. No evidence of pulmonic stenosis. Aorta: The aortic root is normal in size and structure. Venous: The inferior vena cava is dilated in size with less than 50% respiratory variability, suggesting right atrial pressure of 15 mmHg. IAS/Shunts: The interatrial septum was not well  visualized.  LEFT VENTRICLE PLAX 2D LVIDd:         5.00 cm      Diastology LVIDs:         2.80 cm      LV e' medial:    5.25 cm/s LV PW:         0.90 cm      LV E/e' medial:  22.3 LV IVS:        1.00 cm      LV e' lateral:   7.50 cm/s LVOT diam:     2.10 cm      LV E/e' lateral: 15.6 LV SV:         90 LV SV Index:   48 LVOT Area:     3.46 cm  LV Volumes (MOD) LV vol d, MOD A2C: 96.1 ml LV vol d, MOD A4C: 106.0 ml LV vol s, MOD A2C: 35.5 ml LV vol s, MOD A4C: 41.1 ml LV SV MOD A2C:     60.6 ml LV SV MOD A4C:     106.0 ml LV SV MOD BP:      61.8 ml RIGHT VENTRICLE RV S prime:     8.93 cm/s TAPSE (M-mode): 1.4 cm LEFT ATRIUM             Index        RIGHT ATRIUM           Index LA diam:        4.70 cm 2.49 cm/m   RA Area:     27.10 cm LA Vol (A2C):   84.8 ml 44.91 ml/m  RA Volume:   92.40 ml  48.94 ml/m LA Vol (A4C):   84.6 ml 44.81 ml/m LA Biplane Vol: 87.3 ml 46.24 ml/m  AORTIC VALVE                    PULMONIC VALVE AV Area (Vmax):    2.97 cm     PR End Diast Vel: 4.08 msec AV Area (Vmean):   2.77 cm AV Area (VTI):     2.71 cm AV Vmax:           123.79 cm/s AV Vmean:          82.428 cm/s AV VTI:            0.331 m AV Peak Grad:      6.1 mmHg AV Mean Grad:  3.0 mmHg LVOT Vmax:         106.00 cm/s LVOT Vmean:        66.000 cm/s LVOT VTI:          0.259 m LVOT/AV VTI ratio: 0.78  AORTA Ao Root diam: 3.10 cm Ao Asc diam:  3.40 cm MITRAL VALVE                TRICUSPID VALVE MV Area (PHT): 3.06 cm     TR Peak grad:   27.9 mmHg MV Decel Time: 248 msec     TR Vmax:        264.00 cm/s MV E velocity: 117.00 cm/s MV A velocity: 26.20 cm/s   SHUNTS MV E/A ratio:  4.47         Systemic VTI:  0.26 m                             Systemic Diam: 2.10 cm Carlyle Dolly MD Electronically signed by Carlyle Dolly MD Signature Date/Time: 08/22/2022/8:23:43 AM    Final    NM Hepato W/EF  Result Date: 08/21/2022 CLINICAL DATA:  Cholelithiasis question cholecystitis EXAM: NUCLEAR MEDICINE HEPATOBILIARY IMAGING WITH  GALLBLADDER EF TECHNIQUE: Sequential images of the abdomen were obtained out to 60 minutes following intravenous administration of radiopharmaceutical. After slow intravenous infusion of 1.5 micrograms Cholecystokinin, gallbladder ejection fraction was determined. RADIOPHARMACEUTICALS:  5.5 mCi Tc-52mCholetec IV COMPARISON:  Ultrasound abdomen 08/20/2022 FINDINGS: Normal tracer extraction from bloodstream indicating normal hepatocellular function. Normal excretion of tracer into biliary tree. Gallbladder visualized at 18 min. Small bowel visualized at 15 min. No hepatic retention of tracer. Subjectively normal emptying of tracer from gallbladder following CCK administration. Calculated gallbladder ejection fraction is 99%, normal. Patient reported no symptoms following CCK administration. Normal gallbladder ejection fraction following CCK stimulation is greater than 40% at 1 hour. IMPRESSION: Normal exam. Electronically Signed   By: MLavonia DanaM.D.   On: 08/21/2022 14:37   UK[place]Abdomen Limited RUQ (LIVER/GB)  Result Date: 08/20/2022 CLINICAL DATA:  Cholelithiasis. EXAM: ULTRASOUND ABDOMEN LIMITED RIGHT UPPER QUADRANT COMPARISON:  CT scan of August 19, 2022. Ultrasound of June 26, 2022. FINDINGS: Gallbladder: Cholelithiasis is noted with severe gallbladder wall thickening measuring 13 mm. No sonographic Murphy's sign is noted. Moderate amount of pericholecystic fluid is noted. Common bile duct: Diameter: 4 mm which is within normal limits. Liver: No focal lesion identified. Within normal limits in parenchymal echogenicity. Portal vein is patent on color Doppler imaging with normal direction of blood flow towards the liver. Other: Minimal ascites is noted. IMPRESSION: Cholelithiasis is noted with severe gallbladder wall thickening and pericholecystic fluid highly concerning for acute cholecystitis. These results will be called to the ordering clinician or representative by the Radiologist Assistant, and  communication documented in the PACS or zVision Dashboard. Electronically Signed   By: JMarijo ConceptionM.D.   On: 08/20/2022 10:48   DG CHEST PORT 1 VIEW  Result Date: 08/20/2022 CLINICAL DATA:  Difficulty breathing.  Hypoxia. EXAM: PORTABLE CHEST 1 VIEW COMPARISON:  August 19, 2022. FINDINGS: Stable cardiomegaly. Sternotomy wires are noted. Mild bibasilar subsegmental atelectasis is noted. Bony thorax is unremarkable. IMPRESSION: Mild bibasilar subsegmental atelectasis. Electronically Signed   By: JMarijo ConceptionM.D.   On: 08/20/2022 09:53   CT ABDOMEN PELVIS W CONTRAST  Result Date: 08/19/2022 CLINICAL DATA:  Sepsis. Vomiting. [age] year. Patient reports surgery 1 month ago on penis. EXAM: CT  ABDOMEN AND PELVIS WITH CONTRAST TECHNIQUE: Multidetector CT imaging of the abdomen and pelvis was performed using the standard protocol following bolus administration of intravenous contrast. RADIATION DOSE REDUCTION: This exam was performed according to the departmental dose-optimization program which includes automated exposure control, adjustment of the mA and/or kV according to patient size and/or use of iterative reconstruction technique. CONTRAST:  153m OMNIPAQUE IOHEXOL 350 MG/ML SOLN COMPARISON:  Abdominopelvic CT 02/06/2022 FINDINGS: Lower chest: Dependent atelectasis with trace bilateral pleural thickening, no significant effusion. Cardiomegaly with coronary artery calcifications. Prior median sternotomy. Hepatobiliary: There is no focal hepatic lesion. Gallbladder is distended. Patient motion artifact through the gallbladder limits assessment for pericholecystic inflammation. There is no definite wall thickening. 15 mm rounded high-density in the gallbladder likely represent gallstones, with compared with 06/26/2022 abdominal ultrasound. There is diffuse periportal edema. No biliary dilatation. Pancreas: No ductal dilatation or inflammation. Spleen: Tiny subcentimeter hypodensity in the lower spleen,  nonspecific. Spleen is normal in size. Adrenals/Urinary Tract: Normal adrenal glands. Mild prominence of the right renal collecting system without frank hydronephrosis. Mild thinning of bilateral renal parenchyma calcification in the right renal hilum is felt to be vascular rather than nonobstructing stone. Elongated urinary bladder which extends to the level of the umbilicus. No bladder wall thickening. Stomach/Bowel: No bowel obstruction or inflammatory change. Moderate volume of colonic stool. There is stool distending the rectum. No rectal or colonic wall thickening. Stomach is decompressed. Appendix not visualized. Vascular/Lymphatic: Aortic and branch atherosclerosis. No aortic aneurysm. Patent portal vein. Prominent left inguinal node measures 10 mm short axis. This is slightly more prominent than on prior exam. Reproductive: Surgical clips in the prostate which is prominent size. No evidence of prostatic fluid collection. The included portion of the penis and urethra are unremarkable. Other: No ascites. No free air. Tiny fat containing umbilical hernia. Minimal fat in the left inguinal canal. Musculoskeletal: Postsurgical change in the lumbar spine. Spinal stimulator in place. No focal bone lesions. IMPRESSION: 1. Distended gallbladder with gallstones. Patient motion artifact through the gallbladder limits assessment for pericholecystic inflammation. Consider right upper quadrant ultrasound for further evaluation as clinically indicated. 2. Moderate volume of colonic stool with stool distending the rectum, suggesting constipation. 3. Elongated urinary bladder which extends to the level of the umbilicus, can be seen with urinary retention. No bladder wall thickening. 4. Prominent left inguinal node is slightly more prominent than on prior exam, likely reactive. 5. Additional chronic findings as described. Aortic Atherosclerosis (ICD10-I70.0). Electronically Signed   By: MKeith RakeM.D.   On: 08/19/2022  16:44   DG Chest Port 1 View  Result Date: 08/19/2022 CLINICAL DATA:  Fever.  Recent surgery. EXAM: PORTABLE CHEST 1 VIEW COMPARISON:  01/01/2021. FINDINGS: Stable changes from prior cardiac surgery. No mediastinal or hilar masses. Clear lungs.  No pleural effusion or pneumothorax. Skeletal structures are grossly intact. IMPRESSION: No active disease. Electronically Signed   By: DLajean ManesM.D.   On: 08/19/2022 12:30    DOrson Eva DO  [facility]  If 7PM-7AM, please contact night-coverage [email or web address] Password [email or web address] 08/22/2022, 12:54 PM   LOS: 3 days

## 2022-08-22 NOTE — Progress Notes (Signed)
   Consult note reviewed, please refer to original consult note in regards to preoperative cardiac evaluation.  Patient history of afib, has been in and out of afib this admit. He is on amio at home, had been held initially on admit due to low HRs now back on. EKG computer read with long QTc however inaccurate, mnaual around 430m. Tele in and out of afib, rates controlled when in it. Continue oral amio. Restart eliquis when able from primary team standpoint. No additional cardiac recs at this time, we will sign off intpatient care   JCarlyle DollyMD   For questions or updates, please contact CNorlinaPlease consult [email or web address] for contact info under        Signed, BCarlyle Dolly MD  08/22/2022, 8:06 AM

## 2022-08-22 NOTE — Assessment & Plan Note (Addendum)
RUQ ultrasound--Cholelithiasis is noted with severe gallbladder wall thickening and pericholecystic fluid HIDA--neg -appreciate general surgery--discussed with Dr. Arnoldo Morale Diet advanced which patient tolerated

## 2022-08-23 DIAGNOSIS — R7881 Bacteremia: Secondary | ICD-10-CM | POA: Diagnosis not present

## 2022-08-23 DIAGNOSIS — K8020 Calculus of gallbladder without cholecystitis without obstruction: Secondary | ICD-10-CM | POA: Diagnosis not present

## 2022-08-23 DIAGNOSIS — A419 Sepsis, unspecified organism: Secondary | ICD-10-CM | POA: Diagnosis not present

## 2022-08-23 DIAGNOSIS — N320 Bladder-neck obstruction: Secondary | ICD-10-CM | POA: Diagnosis not present

## 2022-08-23 LAB — COMPREHENSIVE METABOLIC PANEL
ALT: 51 U/L — ABNORMAL HIGH (ref 0–44)
AST: 37 U/L (ref 15–41)
Albumin: 2.5 g/dL — ABNORMAL LOW (ref 3.5–5.0)
Alkaline Phosphatase: 79 U/L (ref 38–126)
Anion gap: 4 — ABNORMAL LOW (ref 5–15)
BUN: 5 mg/dL — ABNORMAL LOW (ref 8–23)
CO2: 28 mmol/L (ref 22–32)
Calcium: 8.3 mg/dL — ABNORMAL LOW (ref 8.9–10.3)
Chloride: 107 mmol/L (ref 98–111)
Creatinine, Ser: 0.67 mg/dL (ref 0.61–1.24)
GFR, Estimated: >60 mL/min (ref >60)
Glucose, Bld: 129 mg/dL — ABNORMAL HIGH (ref 70–99)
Potassium: 3.3 mmol/L — ABNORMAL LOW (ref 3.5–5.1)
Sodium: 139 mmol/L (ref 135–145)
Total Bilirubin: 1 mg/dL (ref 0.3–1.2)
Total Protein: 5.3 g/dL — ABNORMAL LOW (ref 6.5–8.1)

## 2022-08-23 LAB — MAGNESIUM: Magnesium: 1.8 mg/dL (ref 1.7–2.4)

## 2022-08-23 LAB — CBC WITH DIFFERENTIAL/PLATELET
Abs Immature Granulocytes: 0.03 10*3/uL (ref 0.00–0.07)
Basophils Absolute: 0 10*3/uL (ref 0.0–0.1)
Basophils Relative: 0 %
Eosinophils Absolute: 0.1 10*3/uL (ref 0.0–0.5)
Eosinophils Relative: 2 %
HCT: 31.1 % — ABNORMAL LOW (ref 39.0–52.0)
Hemoglobin: 10.3 g/dL — ABNORMAL LOW (ref 13.0–17.0)
Immature Granulocytes: 1 %
Lymphocytes Relative: 22 %
Lymphs Abs: 1.3 10*3/uL (ref 0.7–4.0)
MCH: 29.8 pg (ref 26.0–34.0)
MCHC: 33.1 g/dL (ref 30.0–36.0)
MCV: 89.9 fL (ref 80.0–100.0)
Monocytes Absolute: 0.6 10*3/uL (ref 0.1–1.0)
Monocytes Relative: 11 %
Neutro Abs: 3.6 10*3/uL (ref 1.7–7.7)
Neutrophils Relative %: 64 %
Platelets: 157 10*3/uL (ref 150–400)
RBC: 3.46 MIL/uL — ABNORMAL LOW (ref 4.22–5.81)
RDW: 14 % (ref 11.5–15.5)
WBC: 5.7 10*3/uL (ref 4.0–10.5)
nRBC: 0 % (ref 0.0–0.2)

## 2022-08-23 MED ORDER — POTASSIUM CHLORIDE CRYS ER 20 MEQ PO TBCR
40.0000 meq | EXTENDED_RELEASE_TABLET | Freq: Once | ORAL | Status: AC
Start: 2022-08-23 — End: 2022-08-23
  Administered 2022-08-23: 40 meq via ORAL
  Filled 2022-08-23: qty 2

## 2022-08-23 NOTE — Evaluation (Signed)
Physical Therapy Evaluation Patient Details Name: Christopher [name] MRN: [id number] DOB: 1946/03/29 Today's Date: 08/23/2022  History of Present Illness  Christopher [name] is a 76 y.o. male with medical history significant for gastric ulcer, coronary artery disease, BPH, atrial fibrillation on chronic anticoagulation.  Patient presented to the ED with complaints of bleeding from his urethra today after self cath.  Patient had a UroLift procedure- 08/01/22 by Dr. [name].  Since then he has been having to self cath every other day, on the days he does not self cath, he reports normal urine.   Reports 2 days ago, he was having difficulty inserting the Foley catheter and he had pain but he was able to pass a true.  Yesterday he had he believes normal amount of urine, without blood.  Today he had a little bit of pain again, when he was self cathing, but subsequently started seeing blood, with clots.  He reports he tried milking his penis, get out the blood, and suddenly a jet of blood came out from his penis onto the wall with blood clots.  He reports some pain in his lower abdomen prior to this, but after the blood came out, he had relief.  Reports 2 episodes of vomiting today, he otherwise denies difficulty breathing, no cough, no diarrhea.,  No pain or redness of his extremities.  He is on Eliquis and has been compliant, his last dose of Eliquis was this morning   Clinical Impression  Patient functioning near baseline for functional mobility and gait demonstrating good return for ambulation in room and hallways without loss of balance.  Patient ambulated on room air with SpO2 at 92% and tolerated sitting up in chair with SpO2 increasing to 95% while on room air - RN notified.  Plan:  Patient discharged from physical therapy to care of nursing for ambulation daily as tolerated for length of stay.         Recommendations for follow up therapy are one component of a multi-disciplinary discharge planning process, led  by the attending physician.  Recommendations may be updated based on patient status, additional functional criteria and insurance authorization.  Follow Up Recommendations No PT follow up      Assistance Recommended at Discharge PRN  Patient can return home with the following  Other (comment) (near baseline)    Equipment Recommendations None recommended by PT  Recommendations for Other Services       Functional Status Assessment Patient has not had a recent decline in their functional status     Precautions / Restrictions Precautions Precautions: None Restrictions Weight Bearing Restrictions: No      Mobility  Bed Mobility Overal bed mobility: Needs Assistance Bed Mobility: Supine to Sit     Supine to sit: Supervision, Modified independent (Device/Increase time)     General bed mobility comments: increased time, labored movement    Transfers Overall transfer level: Modified independent                      Ambulation/Gait Ambulation/Gait assistance: Modified independent (Device/Increase time) Gait Distance (Feet): 150 Feet Assistive device: None Gait Pattern/deviations: WFL(Within Functional Limits) Gait velocity: slightly decreased     General Gait Details: grossly WFL with good return for ambulation in room and hallways without loss of balance, on room air with SpO2 dropping from 96% to 89%, but recovered above 92% walking back to room  Stairs            Wheelchair Mobility  Modified Rankin (Stroke Patients Only)       Balance Overall balance assessment: No apparent balance deficits (not formally assessed)                                           Pertinent Vitals/Pain Pain Assessment Pain Assessment: Faces Faces Pain Scale: Hurts a little bit Pain Location: low back Pain Descriptors / Indicators: Sore Pain Intervention(s): Limited activity within patient's tolerance, Monitored during session, Repositioned     Home Living Family/patient expects to be discharged to:: Private residence Living Arrangements: Spouse/significant other (girl friend) Available Help at Discharge: Family;Available PRN/intermittently Type of Home: House Home Access: Ramped entrance     Alternate Level Stairs-Number of Steps: 13 steps to basement Home Layout: One level;Able to live on main level with bedroom/bathroom;Full bath on main level;Two level Home Equipment: Conservation officer, nature (2 wheels);Cane - single point;Shower seat;BSC/3in1      Prior Function Prior Level of Function : Independent/Modified Independent             Mobility Comments: Hydrographic surveyor, drives ADLs Comments: Independent     Hand Dominance   Dominant Hand: Right    Extremity/Trunk Assessment   Upper Extremity Assessment Upper Extremity Assessment: Overall WFL for tasks assessed    Lower Extremity Assessment Lower Extremity Assessment: Overall WFL for tasks assessed    Cervical / Trunk Assessment Cervical / Trunk Assessment: Normal  Communication   Communication: No difficulties  Cognition Arousal/Alertness: Awake/alert Behavior During Therapy: WFL for tasks assessed/performed Overall Cognitive Status: Within Functional Limits for tasks assessed                                          General Comments      Exercises     Assessment/Plan    PT Assessment Patient does not need any further PT services  PT Problem List         PT Treatment Interventions      PT Goals (Current goals can be found in the Care Plan section)  Acute Rehab PT Goals Patient Stated Goal: return home with family to assist PT Goal Formulation: With patient/family Time For Goal Achievement: 08/23/22 Potential to Achieve Goals: Good    Frequency       Co-evaluation               AM-PAC PT "6 Clicks" Mobility  Outcome Measure Help needed turning from your back to your side while in a flat bed without using  bedrails?: None Help needed moving from lying on your back to sitting on the side of a flat bed without using bedrails?: None Help needed moving to and from a bed to a chair (including a wheelchair)?: None Help needed standing up from a chair using your arms (e.g., wheelchair or bedside chair)?: None Help needed to walk in hospital room?: None Help needed climbing 3-5 steps with a railing? : A Little 6 Click Score: 23    End of Session   Activity Tolerance: Patient tolerated treatment well Patient left: in chair;with call bell/phone within reach;with family/visitor present Nurse Communication: Mobility status PT Visit Diagnosis: Unsteadiness on feet (R26.81);Other abnormalities of gait and mobility (R26.89);Muscle weakness (generalized) (M62.81)    Time: 1020-1040 PT Time Calculation (min) (ACUTE ONLY): 20 min  Charges:   PT Evaluation $PT Eval Moderate Complexity: 1 Mod PT Treatments $Therapeutic Activity: 8-22 mins        12:18 PM, 08/23/22 Lonell Grandchild, MPT Physical Therapist with [facility] [phone] office [phone] mobile phone

## 2022-08-23 NOTE — Discharge Summary (Addendum)
Physician Discharge Summary   Patient: Christopher [name] MRN: [id number] DOB: [birth year]  Admit date:     08/19/2022  Discharge date: 08/24/2022  Discharge Physician: Shanon Brow Brittini Brubeck   PCP: [name], MD   Recommendations at discharge:   Please follow up with primary care provider within 1-2 weeks  Please repeat BMP and CBC in one week    Hospital Course: 76 y.o. male with medical history significant for gastric ulcer, coronary artery disease, BPH, atrial fibrillation on chronic anticoagulation. Patient presented to the ED with complaints of bleeding from his urethra today after self cath.  Patient had a UroLift procedure- 08/01/22 by Dr. [name].  Since then he has been having to self cath every other day, on the days he does not self cath, he reports normal urine.  Reports 2 days ago, he was having difficulty inserting the Foley catheter and he had pain but he was able to pass a true.  Yesterday he had he believes normal amount of urine, without blood.  Today he had a little bit of pain again, when he was self cathing, but subsequently started seeing blood, with clots.  He reports he tried milking his penis, get out the blood, and suddenly a jet of blood came out from his penis onto the wall with blood clots.  He reports some pain in his lower abdomen prior to this, but after the blood came out, he had relief. Reports 2 episodes of vomiting today, he otherwise denies difficulty breathing, no cough, no diarrhea.,  No pain or redness of his extremities. He is on Eliquis and has been compliant, his last dose of Eliquis was this morning   ED Course: Febrile to 103.  Heart rate 57-105.  Respirate rate 15-31.  Blood pressure systolic 32-122.  Lactic acidosis 4.9 > 3.2.  WBC 9.8.  UA with small leukocytes blood, 21-50 WBCs. 2.25 L sepsis fluid bolus given, maintenance of LR at 150.  Portable chest x-ray unremarkable.  Renal ultrasound could not be obtained so CT was ordered   CT abdomen and pelvis with  contrast-distended gallbladder with gallstones, RUQ ultrasound recommended.  Moderate volume of stool, elongated urinary bladder which extends to the level of the umbilicus, seen with urinary retention, no bladder wall thickening.   Cefepime vancomycin and metronidazole started. Once cultures returned to reveal E. Faecalis, antibiotics were changed to Unasyn which was then de-escalated to amoxil.  Assessment and Plan: * Severe sepsis due to Enterococcus Febrile to 103.   With significant lactic acidosis 4.9 > 3.2.  Likely urinary source of enterococcus infection -continue supportive measures -Follow-up blood and urine cultures -lactic acid peaked 4.9 -sepsis physiology resolved with IVF and IV abx  Enterococcus Bacteremia -- source = urine -- Appreciate ID consult -- 08/21/23 TTE EF 55-60%, no WMA, G2DD, mild TR, no veg -- if repeated BCs positive then would need a TEE per Dr. Gale Journey -- continue IV antibiotics>>changed to po amoxil 9/7 -- 9/4 blood cult--NGTD -- d/c home with 10 more days amoxil  Urinary tract infection UA suggestive with leukocytes, 21-50 WBCs.  Patient has been self cathing every other day.  He is status post UroLift procedure 8/16 by Dr. [name].  He had done well after this procedure until 2 days ago when he had difficulty and pain during self cath, and today gross hematuria with clots. -CT abdomen and pelvis-showing distended urinary bladder suggesting urinary retention, distended gallbladder with stones -IV ceftriaxone 2 g initially started>>Unasyn>>amoxil -Follow-up urine cultures--E. faecalis  Bladder outlet obstruction CT abdomen and pelvis with contrast - elongated urinary bladder which extends to the level of the umbilicus, can be seen with urinary retention. - Urology consultation appreciated -Inserted [place] foley catheter -Resume silodosin -d/c home with foley>>discussed with Dr. [name] who will see him in office next week (08/29/22)  Calculus of  gallbladder without cholecystitis without obstruction RUQ ultrasound--Cholelithiasis is noted with severe gallbladder wall thickening and pericholecystic fluid HIDA--neg -appreciate general surgery--discussed with Dr. Arnoldo Morale Diet advanced which patient tolerated  Paroxysmal atrial fibrillation ([place]) Initially bradycardic and amio stopped HR improved>>amio restarted AC held due to hematuria Restart apixaban now with hematuria resolved  Bradycardia -- HR as low as low 40s (primarily) when asleep - HR low 50s when awake -- pt asymptomatic -- we have held amiodarone as a result of the bradycardia>>restarted 9/5 -- HR remained in low 50s--discussed with cardiology 08/24/22 --ok to remain on current dose amiodarone.  They will arrange outpt follow up  Hypomagnesemia Mag- 1.5. QTc at 450. He is on amiodarone but being held initially - Replete  Coronary artery disease involving native coronary artery of native heart with angina pectoris ([place]) --no chest pain -personally reviewed EKG--no concerning STT changes         Consultants: ID, cardiology, urology Procedures performed: none  Disposition: Home Diet recommendation:  Cardiac diet DISCHARGE MEDICATION: Allergies as of 08/24/2022       Reactions   Tylenol [acetaminophen] Other (See Comments)   Stomach pain    Morphine And Related Nausea And Vomiting   Propofol Nausea And Vomiting        Medication List     STOP taking these medications    nitrofurantoin (macrocrystal-monohydrate) 100 MG capsule Commonly known as: MACROBID   Simethicone 180 MG Caps   sulfamethoxazole-trimethoprim 800-160 MG tablet Commonly known as: BACTRIM DS       TAKE these medications    ALPRAZolam 1 MG tablet Commonly known as: XANAX Take 1 mg by mouth at bedtime.   amiodarone 200 MG tablet Commonly known as: PACERONE TAKE 1 TABLET BY MOUTH EVERY DAY   amoxicillin 500 MG capsule Commonly known as: AMOXIL Take 2 capsules (1,000  mg total) by mouth every 8 (eight) hours.   apixaban 5 MG Tabs tablet Commonly known as: ELIQUIS Take 1 tablet (5 mg total) by mouth 2 (two) times daily. *Restart 04/28/2022* What changed: additional instructions   aspirin EC 81 MG tablet Take 1 tablet (81 mg total) by mouth daily.   atorvastatin 80 MG tablet Commonly known as: LIPITOR Take 1 tablet (80 mg total) by mouth daily.   B-12 PO Take 1 tablet by mouth daily.   docusate sodium 250 MG capsule Commonly known as: COLACE Take 250 mg by mouth daily.   FIBER-CAPS PO Take 2 capsules by mouth daily.   finasteride 5 MG tablet Commonly known as: PROSCAR TAKE 1 TABLET (5 MG TOTAL) BY MOUTH DAILY.   furosemide 20 MG tablet Commonly known as: LASIX Take 1 tablet (20 mg total) by mouth daily.   gabapentin 400 MG capsule Commonly known as: NEURONTIN One three times a day What changed:  how much to take how to take this when to take this additional instructions   lubiprostone 24 MCG capsule Commonly known as: Amitiza Take 1 capsule (24 mcg total) by mouth 2 (two) times daily with a meal.   nitroGLYCERIN 0.4 MG SL tablet Commonly known as: NITROSTAT Place 1 tablet (0.4 mg total) under the tongue every 5 (  five) minutes as needed for chest pain.   Omega-3 1000 MG Caps Take 1,000 mg by mouth daily.   pantoprazole 40 MG tablet Commonly known as: PROTONIX TAKE 1 TABLET BY MOUTH TWICE A DAY (30 TO 60 MINUTES BEFORE 1ST AND LAST MEALS OF THE DAY) What changed:  how much to take when to take this additional instructions   potassium chloride 10 MEQ tablet Commonly known as: KLOR-CON M Take 10 mEq by mouth daily.   silodosin 8 MG Caps capsule Commonly known as: RAPAFLO Take 1 capsule (8 mg total) by mouth at bedtime.   Systane Balance 0.6 % Soln Generic drug: Propylene Glycol Place 1 drop into both eyes daily as needed (dry eyes).   traMADol 50 MG tablet Commonly known as: ULTRAM Take 1-2 tablets (50-100 mg  total) by mouth every 4 (four) hours as needed.   Vitamin D3 10 MCG (400 UNIT) Caps Take 400 Units by mouth daily.   vitamin E 200 UNIT capsule Take 200 Units by mouth daily.   ZINC 15 PO Take 15 mg by mouth daily.        Follow-up Information     Erma Heritage, PA-C Follow up.   Specialties: Physician Assistant, Cardiology Why: [facility] at [facility] - Cardiology follow-up on Tuesday Sep 25, 2022 at 3:30 PM (Arrive by 3:15 PM) Contact information: [address] [place] [address] [phone]                Discharge Exam: Filed Weights   08/20/22 0802 08/21/22 0500 08/22/22 0600  Weight: 75.3 kg 75.2 kg 79 kg   HEENT:  [place]/AT, No thrush, no icterus CV:  RRR, no rub, no S3, no S4 Lung:  bibasilar rales. No wheeze Abd:  soft/+BS, NT Ext:  No edema, no lymphangitis, no synovitis, no rash   Condition at discharge: stable  The results of significant diagnostics from this hospitalization (including imaging, microbiology, ancillary and laboratory) are listed below for reference.   Imaging Studies: ECHOCARDIOGRAM COMPLETE  Result Date: 08/22/2022    ECHOCARDIOGRAM REPORT   Patient Name:   Christopher [name] Date of Exam: 08/20/2022 Medical Rec #:  [id number]     Height:       68.0 in Accession #:    [id number]    Weight:       166.0 lb Date of Birth:  Oct 10, 1946     BSA:          1.888 m Patient Age:    68 years      BP:           103/42 mmHg Patient Gender: M             HR:           45 bpm. Exam Location:  [facility] Procedure: 2D Echo, Cardiac Doppler and Color Doppler Indications:    Preoperative evaluation  History:        Patient has prior history of Echocardiogram examinations, most                 recent 12/25/2019. CAD, Arrythmias:Atrial Fibrillation; Risk                 Factors:Sleep Apnea and GERD.  Sonographer:    Bernadene Person RDCS Referring Phys: [place]  1. Left ventricular ejection fraction, by estimation,  is 55 to 60%. The left ventricle has normal function. The left ventricle has no regional  wall motion abnormalities. Left ventricular diastolic parameters are consistent with Grade II diastolic dysfunction (pseudonormalization). Elevated left atrial pressure.  2. Right ventricular systolic function is normal. The right ventricular size is mildly enlarged. There is mildly elevated pulmonary artery systolic pressure.  3. Left atrial size was moderately dilated.  4. Right atrial size was moderately dilated.  5. The mitral valve is normal in structure. No evidence of mitral valve regurgitation. No evidence of mitral stenosis.  6. The aortic valve has an indeterminant number of cusps. Aortic valve regurgitation is not visualized. No aortic stenosis is present.  7. The inferior vena cava is dilated in size with <50% respiratory variability, suggesting right atrial pressure of 15 mmHg. FINDINGS  Left Ventricle: Left ventricular ejection fraction, by estimation, is 55 to 60%. The left ventricle has normal function. The left ventricle has no regional wall motion abnormalities. The left ventricular internal cavity size was normal in size. There is  no left ventricular hypertrophy. Left ventricular diastolic parameters are consistent with Grade II diastolic dysfunction (pseudonormalization). Elevated left atrial pressure. Right Ventricle: The right ventricular size is mildly enlarged. Right vetricular wall thickness was not well visualized. Right ventricular systolic function is normal. There is mildly elevated pulmonary artery systolic pressure. The tricuspid regurgitant  velocity is 2.64 m/s, and with an assumed right atrial pressure of 15 mmHg, the estimated right ventricular systolic pressure is 29.5 mmHg. Left Atrium: Left atrial size was moderately dilated. Right Atrium: Right atrial size was moderately dilated. Pericardium: There is no evidence of pericardial effusion. Mitral Valve: The mitral valve is normal in  structure. No evidence of mitral valve regurgitation. No evidence of mitral valve stenosis. Tricuspid Valve: The tricuspid valve is not well visualized. Tricuspid valve regurgitation is mild . No evidence of tricuspid stenosis. Aortic Valve: The aortic valve has an indeterminant number of cusps. Aortic valve regurgitation is not visualized. No aortic stenosis is present. Aortic valve mean gradient measures 3.0 mmHg. Aortic valve peak gradient measures 6.1 mmHg. Aortic valve area, by VTI measures 2.71 cm. Pulmonic Valve: The pulmonic valve was not well visualized. Pulmonic valve regurgitation is mild. No evidence of pulmonic stenosis. Aorta: The aortic root is normal in size and structure. Venous: The inferior vena cava is dilated in size with less than 50% respiratory variability, suggesting right atrial pressure of 15 mmHg. IAS/Shunts: The interatrial septum was not well visualized.  LEFT VENTRICLE PLAX 2D LVIDd:         5.00 cm      Diastology LVIDs:         2.80 cm      LV e' medial:    5.25 cm/s LV PW:         0.90 cm      LV E/e' medial:  22.3 LV IVS:        1.00 cm      LV e' lateral:   7.50 cm/s LVOT diam:     2.10 cm      LV E/e' lateral: 15.6 LV SV:         90 LV SV Index:   48 LVOT Area:     3.46 cm  LV Volumes (MOD) LV vol d, MOD A2C: 96.1 ml LV vol d, MOD A4C: 106.0 ml LV vol s, MOD A2C: 35.5 ml LV vol s, MOD A4C: 41.1 ml LV SV MOD A2C:     60.6 ml LV SV MOD A4C:     106.0 ml LV SV MOD BP:  61.8 ml RIGHT VENTRICLE RV S prime:     8.93 cm/s TAPSE (M-mode): 1.4 cm LEFT ATRIUM             Index        RIGHT ATRIUM           Index LA diam:        4.70 cm 2.49 cm/m   RA Area:     27.10 cm LA Vol (A2C):   84.8 ml 44.91 ml/m  RA Volume:   92.40 ml  48.94 ml/m LA Vol (A4C):   84.6 ml 44.81 ml/m LA Biplane Vol: 87.3 ml 46.24 ml/m  AORTIC VALVE                    PULMONIC VALVE AV Area (Vmax):    2.97 cm     PR End Diast Vel: 4.08 msec AV Area (Vmean):   2.77 cm AV Area (VTI):     2.71 cm AV Vmax:            123.79 cm/s AV Vmean:          82.428 cm/s AV VTI:            0.331 m AV Peak Grad:      6.1 mmHg AV Mean Grad:      3.0 mmHg LVOT Vmax:         106.00 cm/s LVOT Vmean:        66.000 cm/s LVOT VTI:          0.259 m LVOT/AV VTI ratio: 0.78  AORTA Ao Root diam: 3.10 cm Ao Asc diam:  3.40 cm MITRAL VALVE                TRICUSPID VALVE MV Area (PHT): 3.06 cm     TR Peak grad:   27.9 mmHg MV Decel Time: 248 msec     TR Vmax:        264.00 cm/s MV E velocity: 117.00 cm/s MV A velocity: 26.20 cm/s   SHUNTS MV E/A ratio:  4.47         Systemic VTI:  0.26 m                             Systemic Diam: 2.10 cm Carlyle Dolly MD Electronically signed by Carlyle Dolly MD Signature Date/Time: 08/22/2022/8:23:43 AM    Final    NM Hepato W/EF  Result Date: 08/21/2022 CLINICAL DATA:  Cholelithiasis question cholecystitis EXAM: NUCLEAR MEDICINE HEPATOBILIARY IMAGING WITH GALLBLADDER EF TECHNIQUE: Sequential images of the abdomen were obtained out to 60 minutes following intravenous administration of radiopharmaceutical. After slow intravenous infusion of 1.5 micrograms Cholecystokinin, gallbladder ejection fraction was determined. RADIOPHARMACEUTICALS:  5.5 mCi Tc-37mCholetec IV COMPARISON:  Ultrasound abdomen 08/20/2022 FINDINGS: Normal tracer extraction from bloodstream indicating normal hepatocellular function. Normal excretion of tracer into biliary tree. Gallbladder visualized at 18 min. Small bowel visualized at 15 min. No hepatic retention of tracer. Subjectively normal emptying of tracer from gallbladder following CCK administration. Calculated gallbladder ejection fraction is 99%, normal. Patient reported no symptoms following CCK administration. Normal gallbladder ejection fraction following CCK stimulation is greater than 40% at 1 hour. IMPRESSION: Normal exam. Electronically Signed   By: MLavonia DanaM.D.   On: 08/21/2022 14:37   [place]Abdomen Limited RUQ (LIVER/GB)  Result Date: 08/20/2022 CLINICAL DATA:   Cholelithiasis. EXAM: ULTRASOUND ABDOMEN LIMITED RIGHT UPPER QUADRANT COMPARISON:  CT scan  of August 19, 2022. Ultrasound of June 26, 2022. FINDINGS: Gallbladder: Cholelithiasis is noted with severe gallbladder wall thickening measuring 13 mm. No sonographic Murphy's sign is noted. Moderate amount of pericholecystic fluid is noted. Common bile duct: Diameter: 4 mm which is within normal limits. Liver: No focal lesion identified. Within normal limits in parenchymal echogenicity. Portal vein is patent on color Doppler imaging with normal direction of blood flow towards the liver. Other: Minimal ascites is noted. IMPRESSION: Cholelithiasis is noted with severe gallbladder wall thickening and pericholecystic fluid highly concerning for acute cholecystitis. These results will be called to the ordering clinician or representative by the Radiologist Assistant, and communication documented in the PACS or zVision Dashboard. Electronically Signed   By: Marijo Conception M.D.   On: 08/20/2022 10:48   DG CHEST PORT 1 VIEW  Result Date: 08/20/2022 CLINICAL DATA:  Difficulty breathing.  Hypoxia. EXAM: PORTABLE CHEST 1 VIEW COMPARISON:  August 19, 2022. FINDINGS: Stable cardiomegaly. Sternotomy wires are noted. Mild bibasilar subsegmental atelectasis is noted. Bony thorax is unremarkable. IMPRESSION: Mild bibasilar subsegmental atelectasis. Electronically Signed   By: Marijo Conception M.D.   On: 08/20/2022 09:53   CT ABDOMEN PELVIS W CONTRAST  Result Date: 08/19/2022 CLINICAL DATA:  Sepsis. Vomiting. [age] year. Patient reports surgery 1 month ago on penis. EXAM: CT ABDOMEN AND PELVIS WITH CONTRAST TECHNIQUE: Multidetector CT imaging of the abdomen and pelvis was performed using the standard protocol following bolus administration of intravenous contrast. RADIATION DOSE REDUCTION: This exam was performed according to the departmental dose-optimization program which includes automated exposure control, adjustment of the mA  and/or kV according to patient size and/or use of iterative reconstruction technique. CONTRAST:  148m OMNIPAQUE IOHEXOL 350 MG/ML SOLN COMPARISON:  Abdominopelvic CT 02/06/2022 FINDINGS: Lower chest: Dependent atelectasis with trace bilateral pleural thickening, no significant effusion. Cardiomegaly with coronary artery calcifications. Prior median sternotomy. Hepatobiliary: There is no focal hepatic lesion. Gallbladder is distended. Patient motion artifact through the gallbladder limits assessment for pericholecystic inflammation. There is no definite wall thickening. 15 mm rounded high-density in the gallbladder likely represent gallstones, with compared with 06/26/2022 abdominal ultrasound. There is diffuse periportal edema. No biliary dilatation. Pancreas: No ductal dilatation or inflammation. Spleen: Tiny subcentimeter hypodensity in the lower spleen, nonspecific. Spleen is normal in size. Adrenals/Urinary Tract: Normal adrenal glands. Mild prominence of the right renal collecting system without frank hydronephrosis. Mild thinning of bilateral renal parenchyma calcification in the right renal hilum is felt to be vascular rather than nonobstructing stone. Elongated urinary bladder which extends to the level of the umbilicus. No bladder wall thickening. Stomach/Bowel: No bowel obstruction or inflammatory change. Moderate volume of colonic stool. There is stool distending the rectum. No rectal or colonic wall thickening. Stomach is decompressed. Appendix not visualized. Vascular/Lymphatic: Aortic and branch atherosclerosis. No aortic aneurysm. Patent portal vein. Prominent left inguinal node measures 10 mm short axis. This is slightly more prominent than on prior exam. Reproductive: Surgical clips in the prostate which is prominent size. No evidence of prostatic fluid collection. The included portion of the penis and urethra are unremarkable. Other: No ascites. No free air. Tiny fat containing umbilical hernia.  Minimal fat in the left inguinal canal. Musculoskeletal: Postsurgical change in the lumbar spine. Spinal stimulator in place. No focal bone lesions. IMPRESSION: 1. Distended gallbladder with gallstones. Patient motion artifact through the gallbladder limits assessment for pericholecystic inflammation. Consider right upper quadrant ultrasound for further evaluation as clinically indicated. 2. Moderate volume of colonic stool with  stool distending the rectum, suggesting constipation. 3. Elongated urinary bladder which extends to the level of the umbilicus, can be seen with urinary retention. No bladder wall thickening. 4. Prominent left inguinal node is slightly more prominent than on prior exam, likely reactive. 5. Additional chronic findings as described. Aortic Atherosclerosis (ICD10-I70.0). Electronically Signed   By: Keith Rake M.D.   On: 08/19/2022 16:44   DG Chest Port 1 View  Result Date: 08/19/2022 CLINICAL DATA:  Fever.  Recent surgery. EXAM: PORTABLE CHEST 1 VIEW COMPARISON:  01/01/2021. FINDINGS: Stable changes from prior cardiac surgery. No mediastinal or hilar masses. Clear lungs.  No pleural effusion or pneumothorax. Skeletal structures are grossly intact. IMPRESSION: No active disease. Electronically Signed   By: Lajean Manes M.D.   On: 08/19/2022 12:30    Microbiology: Results for orders placed or performed during the hospital encounter of 08/19/22  Resp Panel by RT-PCR (Flu A&B, Covid) Anterior Nasal Swab     Status: None   Collection Time: 08/19/22 12:02 PM   Specimen: Anterior Nasal Swab  Result Value Ref Range Status   SARS Coronavirus 2 by RT PCR NEGATIVE NEGATIVE Final    Comment: (NOTE) SARS-CoV-2 target nucleic acids are NOT DETECTED.  The SARS-CoV-2 RNA is generally detectable in upper respiratory specimens during the acute phase of infection. The lowest concentration of SARS-CoV-2 viral copies this assay can detect is 138 copies/mL. A negative result does not  preclude SARS-Cov-2 infection and should not be used as the sole basis for treatment or other patient management decisions. A negative result may occur with  improper specimen collection/handling, submission of specimen other than nasopharyngeal swab, presence of viral mutation(s) within the areas targeted by this assay, and inadequate number of viral copies(<138 copies/mL). A negative result must be combined with clinical observations, patient history, and epidemiological information. The expected result is Negative.  Fact Sheet for Patients:  EntrepreneurPulse.com.au  Fact Sheet for Healthcare Providers:  IncredibleEmployment.be  This test is no t yet approved or cleared by the [place] FDA and  has been authorized for detection and/or diagnosis of SARS-CoV-2 by FDA under an Emergency Use Authorization (EUA). This EUA will remain  in effect (meaning this test can be used) for the duration of the COVID-19 declaration under Section 564(b)(1) of the Act, 21 U.S.C.section 360bbb-3(b)(1), unless the authorization is terminated  or revoked sooner.       Influenza A by PCR NEGATIVE NEGATIVE Final   Influenza B by PCR NEGATIVE NEGATIVE Final    Comment: (NOTE) The Xpert Xpress SARS-CoV-2/FLU/RSV plus assay is intended as an aid in the diagnosis of influenza from Nasopharyngeal swab specimens and should not be used as a sole basis for treatment. Nasal washings and aspirates are unacceptable for Xpert Xpress SARS-CoV-2/FLU/RSV testing.  Fact Sheet for Patients: EntrepreneurPulse.com.au  Fact Sheet for Healthcare Providers: IncredibleEmployment.be  This test is not yet approved or cleared by the [place] FDA and has been authorized for detection and/or diagnosis of SARS-CoV-2 by FDA under an Emergency Use Authorization (EUA). This EUA will remain in effect (meaning this test can be used) for the  duration of the COVID-19 declaration under Section 564(b)(1) of the Act, 21 U.S.C. section 360bbb-3(b)(1), unless the authorization is terminated or revoked.  Performed at [facility], [address], [place], [place] [address]   Culture, blood (Routine x 2)     Status: Abnormal   Collection Time: 08/19/22 12:07 PM   Specimen: Right Antecubital; Blood  Result Value Ref  Range Status   Specimen Description   Final    RIGHT ANTECUBITAL BOTTLES DRAWN AEROBIC AND ANAEROBIC Performed at [facility], [address] [place], [place] [address]    Special Requests   Final    Blood Culture adequate volume Performed at [facility], [address], [place], [place] [address]    Culture  Setup Time   Final    GRAM POSITIVE COCCI Gram Stain Report Called to,Read Back By and Verified With: M. [place] @ [id number] BY STEPHTR 08/20/22 AEROBIC BOTTLE ONLY CRITICAL VALUE NOTED.  VALUE IS CONSISTENT WITH PREVIOUSLY REPORTED AND CALLED VALUE.    Culture (A)  Final    ENTEROCOCCUS FAECALIS SUSCEPTIBILITIES PERFORMED ON PREVIOUS CULTURE WITHIN THE LAST 5 DAYS. Performed at [facility], [place] [address], [place], [place] [address]    Report Status 08/21/2022 FINAL  Final  Culture, blood (Routine x 2)     Status: Abnormal   Collection Time: 08/19/22 12:08 PM   Specimen: Left Antecubital; Blood  Result Value Ref Range Status   Specimen Description   Final    LEFT ANTECUBITAL BOTTLES DRAWN AEROBIC AND ANAEROBIC Performed at [facility], [address] [place], [place] [address]    Special Requests   Final    Blood Culture adequate volume Performed at [facility], [address], [place], [place] [address]    Culture  Setup Time   Final    GRAM POSITIVE COCCI Gram Stain Report Called to,Read Back By and Verified With: M. FAULK @ 0812 BY STEPHTR 08/20/22 AEROBIC BOTTLE ONLY CRITICAL RESULT CALLED TO, READ BACK BY AND VERIFIED WITH: PHARMD S [place] [id number] AT 1207 BY CM GRAM POSITIVE COCCI ANAEROBIC  BOTTLE ONLY CRITICAL VALUE NOTED.  VALUE IS CONSISTENT WITH PREVIOUSLY REPORTED AND CALLED VALUE. Performed at [facility], [address] [place], [place] [address]    Culture ENTEROCOCCUS FAECALIS (A)  Final   Report Status 08/22/2022 FINAL  Final   Organism ID, Bacteria ENTEROCOCCUS FAECALIS  Final      Susceptibility   Enterococcus faecalis - MIC*    AMPICILLIN <=2 SENSITIVE Sensitive     VANCOMYCIN 1 SENSITIVE Sensitive     GENTAMICIN SYNERGY SENSITIVE Sensitive     * ENTEROCOCCUS FAECALIS  Blood Culture ID Panel (Reflexed)     Status: Abnormal   Collection Time: 08/19/22 12:08 PM  Result Value Ref Range Status   Enterococcus faecalis DETECTED (A) NOT DETECTED Final    Comment: CRITICAL RESULT CALLED TO, READ BACK BY AND VERIFIED WITH: PHARMD S HURTH [id number] AT 1207 BY CM    Enterococcus Faecium NOT DETECTED NOT DETECTED Final   Listeria monocytogenes NOT DETECTED NOT DETECTED Final   Staphylococcus species NOT DETECTED NOT DETECTED Final   Staphylococcus aureus (BCID) NOT DETECTED NOT DETECTED Final   Staphylococcus epidermidis NOT DETECTED NOT DETECTED Final   Staphylococcus lugdunensis NOT DETECTED NOT DETECTED Final   Streptococcus species NOT DETECTED NOT DETECTED Final   Streptococcus agalactiae NOT DETECTED NOT DETECTED Final   Streptococcus pneumoniae NOT DETECTED NOT DETECTED Final   Streptococcus pyogenes NOT DETECTED NOT DETECTED Final   A.calcoaceticus-baumannii NOT DETECTED NOT DETECTED Final   Bacteroides fragilis NOT DETECTED NOT DETECTED Final   Enterobacterales NOT DETECTED NOT DETECTED Final   Enterobacter cloacae complex NOT DETECTED NOT DETECTED Final   Escherichia coli NOT DETECTED NOT DETECTED Final   Klebsiella aerogenes NOT DETECTED NOT DETECTED Final   Klebsiella oxytoca NOT DETECTED NOT DETECTED Final   Klebsiella  pneumoniae NOT DETECTED NOT DETECTED Final   Proteus species NOT DETECTED NOT DETECTED Final   Salmonella species NOT DETECTED NOT  DETECTED Final   Serratia marcescens NOT DETECTED NOT DETECTED Final   Haemophilus influenzae NOT DETECTED NOT DETECTED Final   Neisseria meningitidis NOT DETECTED NOT DETECTED Final   Pseudomonas aeruginosa NOT DETECTED NOT DETECTED Final   Stenotrophomonas maltophilia NOT DETECTED NOT DETECTED Final   Candida albicans NOT DETECTED NOT DETECTED Final   Candida auris NOT DETECTED NOT DETECTED Final   Candida glabrata NOT DETECTED NOT DETECTED Final   Candida krusei NOT DETECTED NOT DETECTED Final   Candida parapsilosis NOT DETECTED NOT DETECTED Final   Candida tropicalis NOT DETECTED NOT DETECTED Final   Cryptococcus neoformans/gattii NOT DETECTED NOT DETECTED Final   Vancomycin resistance NOT DETECTED NOT DETECTED Final    Comment: Performed at [facility], [place] [address], [place], [place] [address]  Urine Culture     Status: Abnormal   Collection Time: 08/19/22  1:56 PM   Specimen: In/Out Cath Urine  Result Value Ref Range Status   Specimen Description   Final    IN/OUT CATH URINE Performed at [facility], [address] [place], [place] [address]    Special Requests   Final    NONE Performed at [facility], [address] [place], [place] [address]    Culture >=100,000 COLONIES/mL ENTEROCOCCUS FAECALIS (A)  Final   Report Status 08/22/2022 FINAL  Final   Organism ID, Bacteria ENTEROCOCCUS FAECALIS (A)  Final      Susceptibility   Enterococcus faecalis - MIC*    AMPICILLIN <=2 SENSITIVE Sensitive     NITROFURANTOIN <=16 SENSITIVE Sensitive     VANCOMYCIN 1 SENSITIVE Sensitive     * >=100,000 COLONIES/mL ENTEROCOCCUS FAECALIS  Culture, blood (Routine X 2) w Reflex to ID Panel     Status: None (Preliminary result)   Collection Time: 08/20/22  3:54 PM   Specimen: BLOOD LEFT HAND  Result Value Ref Range Status   Specimen Description   Final    BLOOD LEFT HAND BOTTLES DRAWN AEROBIC AND ANAEROBIC   Special Requests Blood Culture adequate volume  Final   Culture   Final     NO GROWTH 3 DAYS Performed at [facility], [address] [place], [place] [address]    Report Status PENDING  Incomplete  Culture, blood (Routine X 2) w Reflex to ID Panel     Status: None (Preliminary result)   Collection Time: 08/20/22  3:54 PM   Specimen: BLOOD RIGHT HAND  Result Value Ref Range Status   Specimen Description   Final    BLOOD RIGHT HAND BOTTLES DRAWN AEROBIC AND ANAEROBIC   Special Requests Blood Culture adequate volume  Final   Culture   Final    NO GROWTH 3 DAYS Performed at [facility], [address], [place], [place] [address]    Report Status PENDING  Incomplete  MRSA Next Gen by PCR, Nasal     Status: None   Collection Time: 08/20/22  4:46 PM   Specimen: Nasal Mucosa; Nasal Swab  Result Value Ref Range Status   MRSA by PCR Next Gen NOT DETECTED NOT DETECTED Final    Comment: (NOTE) The GeneXpert MRSA Assay (FDA approved for NASAL specimens only), is one component of a comprehensive MRSA colonization surveillance program. It is not intended to diagnose MRSA infection nor to guide or monitor treatment for MRSA infections. Test performance is not FDA  approved in patients less than 82 years old. Performed at [facility], [address], [place], [place] [address]     Labs: CBC: Recent Labs  Lab 08/19/22 1208 08/20/22 0453 08/21/22 0442 08/22/22 0429 08/23/22 0525  WBC 9.8 11.8* 7.6 6.5 5.7  NEUTROABS 8.6*  --  5.7 4.4 3.6  HGB 13.6 10.6* 10.3* 10.2* 10.3*  HCT 42.6 33.0* 32.2* 31.2* 31.1*  MCV 92.2 91.9 92.0 89.9 89.9  PLT 232 150 130* 139* 681   Basic Metabolic Panel: Recent Labs  Lab 08/19/22 1212 08/20/22 0453 08/21/22 0442 08/22/22 0429 08/23/22 0525 08/24/22 0444  NA  --  138 140 139 139 139  K  --  4.0 3.5 3.1* 3.3* 3.7  CL  --  111 111 108 107 111  CO2  --  '24 25 27 28 24  '$ GLUCOSE  --  125* 121* 95 129* 97  BUN  --  17 12 6* 5* <5*  CREATININE  --  1.28* 0.78 0.60* 0.67 0.72  CALCIUM  --  8.0* 8.1* 8.0* 8.3* 8.4*  MG  1.5* 1.9 1.9  --  1.8  --    Liver Function Tests: Recent Labs  Lab 08/20/22 1120 08/21/22 0442 08/22/22 0429 08/23/22 0525 08/24/22 0444  AST 71* 71* 49* 37 34  ALT 73* 79* 62* 51* 45*  ALKPHOS 99 92 85 79 82  BILITOT 0.7 0.9 1.0 1.0 1.0  PROT 5.3* 5.2* 5.0* 5.3* 5.2*  ALBUMIN 2.7* 2.7* 2.5* 2.5* 2.4*   CBG: No results for input(s): "GLUCAP" in the last 168 hours.  Discharge time spent: greater than 30 minutes.  Signed: Orson Eva, MD [facility] 08/24/2022

## 2022-08-23 NOTE — Progress Notes (Signed)
PROGRESS NOTE  Christopher [name] FAO:[id number] DOB: Nov 26, 1946 DOA: 08/19/2022 PCP: [name], MD  Brief History:  76 y.o. male with medical history significant for gastric ulcer, coronary artery disease, BPH, atrial fibrillation on chronic anticoagulation. Patient presented to the ED with complaints of bleeding from his urethra today after self cath.  Patient had a UroLift procedure- 08/01/22 by Dr. [name].  Since then he has been having to self cath every other day, on the days he does not self cath, he reports normal urine.  Reports 2 days ago, he was having difficulty inserting the Foley catheter and he had pain but he was able to pass a true.  Yesterday he had he believes normal amount of urine, without blood.  Today he had a little bit of pain again, when he was self cathing, but subsequently started seeing blood, with clots.  He reports he tried milking his penis, get out the blood, and suddenly a jet of blood came out from his penis onto the wall with blood clots.  He reports some pain in his lower abdomen prior to this, but after the blood came out, he had relief. Reports 2 episodes of vomiting today, he otherwise denies difficulty breathing, no cough, no diarrhea.,  No pain or redness of his extremities. He is on Eliquis and has been compliant, his last dose of Eliquis was this morning   ED Course: Febrile to 103.  Heart rate 57-105.  Respirate rate 15-31.  Blood pressure systolic 69-629.  Lactic acidosis 4.9 > 3.2.  WBC 9.8.  UA with small leukocytes blood, 21-50 WBCs. 2.25 L sepsis fluid bolus given, maintenance of LR at 150.  Portable chest x-ray unremarkable.  Renal ultrasound could not be obtained so CT was ordered   CT abdomen and pelvis with contrast-distended gallbladder with gallstones, RUQ ultrasound recommended.  Moderate volume of stool, elongated urinary bladder which extends to the level of the umbilicus, seen with urinary retention, no bladder wall thickening.    Cefepime vancomycin and metronidazole started. Once cultures returned to reveal E. Faecalis, antibiotics were changed to Unasyn which was then de-escalated to amoxil.     Assessment and Plan: * Severe sepsis due to Enterococcus Febrile to 103.   With significant lactic acidosis 4.9 > 3.2.  Likely urinary source of enterococcus infection -continue supportive measures -Follow-up blood and urine cultures -lactic acid peaked 4.9 -sepsis physiology resolved with IVF and IV abx  Enterococcus Bacteremia -- source = urine -- Appreciate ID consult -- 08/21/23 TTE EF 55-60%, no WMA, G2DD, mild TR, no veg -- if repeated BCs positive then would need a TEE per Dr. Gale Journey -- continue IV antibiotics>>changed to po amoxil 9/7 -- 9/4 blood cult--NGTD  Urinary tract infection UA suggestive with leukocytes, 21-50 WBCs.  Patient has been self cathing every other day.  He is status post UroLift procedure 8/16 by Dr. [name].  He had done well after this procedure until 2 days ago when he had difficulty and pain during self cath, and today gross hematuria with clots. -CT abdomen and pelvis-showing distended urinary bladder suggesting urinary retention, distended gallbladder with stones -IV ceftriaxone 2 g daily -Follow-up urine cultures--E. faecalis  Bladder outlet obstruction CT abdomen and pelvis with contrast - elongated urinary bladder which extends to the level of the umbilicus, can be seen with urinary retention. - Urology consultation appreciated -Inserted [place] foley catheter -Resume silodosin  Calculus of gallbladder without cholecystitis without obstruction  RUQ ultrasound--Cholelithiasis is noted with severe gallbladder wall thickening and pericholecystic fluid HIDA--neg -appreciate general surgery--discussed with Dr. Arnoldo Morale  Bradycardia -- HR as low as 30 up to mid 40s (primarily) when asleep -- pt asymptomatic -- we have held amiodarone as a result of the bradycardia>>restarted  9/5  Hypomagnesemia Mag- 1.5. QTc at 450. He is on amiodarone but being held initially - Replete  Coronary artery disease involving native coronary artery of native heart with angina pectoris ([place]) --no chest pain -personally reviewed EKG--no concerning STT changes  Family Communication:   spouse updated at bedside 9/6   Consultants:  gen surgery; cardiology   Code Status:  FULL   DVT Prophylaxis:  apixaban on hold     Family Communication:   spouse updated at bedside 9/7   Consultants:  gen surgery; cardiology   Code Status:  FULL   DVT Prophylaxis:  apixaban on hold     Procedures: As Listed in Progress Note Above   Antibiotics: Unasyn 9/4>>9/6 Amoxil 9/6>>        Subjective: Patient denies fevers, chills, headache, chest pain, dyspnea, nausea, vomiting, diarrhea, abdominal pain, dysuria, hematuria, hematochezia, and melena.   Objective: Vitals:   08/23/22 0500 08/23/22 0600 08/23/22 0700 08/23/22 0800  BP: (!) 117/54 126/61 128/70 92/60  Pulse: (!) 56 74 86 83  Resp: 17 (!) 24 18 (!) 22  Temp:  99.1 F (37.3 C)  98.7 F (37.1 C)  TempSrc:  Oral    SpO2: 91% 95% 95% 95%  Weight:      Height:        Intake/Output Summary (Last 24 hours) at 08/23/2022 1346 Last data filed at 08/23/2022 0840 Gross per 24 hour  Intake 857.66 ml  Output 1900 ml  Net -1042.34 ml   Weight change:  Exam:  General:  Pt is alert, follows commands appropriately, not in acute distress HEENT: No icterus, No thrush, No neck mass, [place]/AT Cardiovascular: IRRR, S1/S2, no rubs, no gallops Respiratory: bibasilar rales. No wheeze Abdomen: Soft/+BS, non tender, non distended, no guarding Extremities: No edema, No lymphangitis, No petechiae, No rashes, no synovitis   Data Reviewed: I have personally reviewed following labs and imaging studies Basic Metabolic Panel: Recent Labs  Lab 08/19/22 1208 08/19/22 1212 08/20/22 0453 08/21/22 0442 08/22/22 0429 08/23/22 0525  NA 140   --  138 140 139 139  K 3.9  --  4.0 3.5 3.1* 3.3*  CL 107  --  111 111 108 107  CO2 23  --  '24 25 27 28  '$ GLUCOSE 100*  --  125* 121* 95 129*  BUN 12  --  17 12 6* 5*  CREATININE 1.07  --  1.28* 0.78 0.60* 0.67  CALCIUM 9.0  --  8.0* 8.1* 8.0* 8.3*  MG  --  1.5* 1.9 1.9  --  1.8   Liver Function Tests: Recent Labs  Lab 08/19/22 1208 08/20/22 1120 08/21/22 0442 08/22/22 0429 08/23/22 0525  AST 65* 71* 71* 49* 37  ALT 69* 73* 79* 62* 51*  ALKPHOS 126 99 92 85 79  BILITOT 1.2 0.7 0.9 1.0 1.0  PROT 6.9 5.3* 5.2* 5.0* 5.3*  ALBUMIN 3.7 2.7* 2.7* 2.5* 2.5*   No results for input(s): "LIPASE", "AMYLASE" in the last 168 hours. No results for input(s): "AMMONIA" in the last 168 hours. Coagulation Profile: Recent Labs  Lab 08/19/22 1208  INR 1.2   CBC: Recent Labs  Lab 08/19/22 1208 08/20/22 0453 08/21/22 0442 08/22/22 0429 08/23/22  0525  WBC 9.8 11.8* 7.6 6.5 5.7  NEUTROABS 8.6*  --  5.7 4.4 3.6  HGB 13.6 10.6* 10.3* 10.2* 10.3*  HCT 42.6 33.0* 32.2* 31.2* 31.1*  MCV 92.2 91.9 92.0 89.9 89.9  PLT 232 150 130* 139* 157   Cardiac Enzymes: No results for input(s): "CKTOTAL", "CKMB", "CKMBINDEX", "TROPONINI" in the last 168 hours. BNP: Invalid input(s): "POCBNP" CBG: No results for input(s): "GLUCAP" in the last 168 hours. HbA1C: No results for input(s): "HGBA1C" in the last 72 hours. Urine analysis:    Component Value Date/Time   COLORURINE YELLOW 08/19/2022 1356   APPEARANCEUR HAZY (A) 08/19/2022 1356   APPEARANCEUR Cloudy (A) 08/01/2022 1133   LABSPEC 1.014 08/19/2022 1356   PHURINE 5.0 08/19/2022 1356   GLUCOSEU NEGATIVE 08/19/2022 1356   HGBUR LARGE (A) 08/19/2022 1356   BILIRUBINUR NEGATIVE 08/19/2022 1356   BILIRUBINUR Negative 08/01/2022 1133   KETONESUR NEGATIVE 08/19/2022 1356   PROTEINUR NEGATIVE 08/19/2022 1356   UROBILINOGEN 0.2 03/07/2014 2103   NITRITE NEGATIVE 08/19/2022 1356   LEUKOCYTESUR SMALL (A) 08/19/2022 1356   Sepsis  Labs: '@LABRCNTIP'$ (procalcitonin:4,lacticidven:4) ) Recent Results (from the past 240 hour(s))  Resp Panel by RT-PCR (Flu A&B, Covid) Anterior Nasal Swab     Status: None   Collection Time: 08/19/22 12:02 PM   Specimen: Anterior Nasal Swab  Result Value Ref Range Status   SARS Coronavirus 2 by RT PCR NEGATIVE NEGATIVE Final    Comment: (NOTE) SARS-CoV-2 target nucleic acids are NOT DETECTED.  The SARS-CoV-2 RNA is generally detectable in upper respiratory specimens during the acute phase of infection. The lowest concentration of SARS-CoV-2 viral copies this assay can detect is 138 copies/mL. A negative result does not preclude SARS-Cov-2 infection and should not be used as the sole basis for treatment or other patient management decisions. A negative result may occur with  improper specimen collection/handling, submission of specimen other than nasopharyngeal swab, presence of viral mutation(s) within the areas targeted by this assay, and inadequate number of viral copies(<138 copies/mL). A negative result must be combined with clinical observations, patient history, and epidemiological information. The expected result is Negative.  Fact Sheet for Patients:  EntrepreneurPulse.com.au  Fact Sheet for Healthcare Providers:  IncredibleEmployment.be  This test is no t yet approved or cleared by the [place] FDA and  has been authorized for detection and/or diagnosis of SARS-CoV-2 by FDA under an Emergency Use Authorization (EUA). This EUA will remain  in effect (meaning this test can be used) for the duration of the COVID-19 declaration under Section 564(b)(1) of the Act, 21 U.S.C.section 360bbb-3(b)(1), unless the authorization is terminated  or revoked sooner.       Influenza A by PCR NEGATIVE NEGATIVE Final   Influenza B by PCR NEGATIVE NEGATIVE Final    Comment: (NOTE) The Xpert Xpress SARS-CoV-2/FLU/RSV plus assay is intended as an  aid in the diagnosis of influenza from Nasopharyngeal swab specimens and should not be used as a sole basis for treatment. Nasal washings and aspirates are unacceptable for Xpert Xpress SARS-CoV-2/FLU/RSV testing.  Fact Sheet for Patients: EntrepreneurPulse.com.au  Fact Sheet for Healthcare Providers: IncredibleEmployment.be  This test is not yet approved or cleared by the [place] FDA and has been authorized for detection and/or diagnosis of SARS-CoV-2 by FDA under an Emergency Use Authorization (EUA). This EUA will remain in effect (meaning this test can be used) for the duration of the COVID-19 declaration under Section 564(b)(1) of the Act, 21 U.S.C. section 360bbb-3(b)(1), unless the authorization  is terminated or revoked.  Performed at [facility], [address] [place], [place] [address]   Culture, blood (Routine x 2)     Status: Abnormal   Collection Time: 08/19/22 12:07 PM   Specimen: Right Antecubital; Blood  Result Value Ref Range Status   Specimen Description   Final    RIGHT ANTECUBITAL BOTTLES DRAWN AEROBIC AND ANAEROBIC Performed at [facility], [address], [place], [place] [address]    Special Requests   Final    Blood Culture adequate volume Performed at [facility], [address], [place], [place] [address]    Culture  Setup Time   Final    GRAM POSITIVE COCCI Gram Stain Report Called to,Read Back By and Verified With: M. [place] @ 9767 BY STEPHTR 08/20/22 AEROBIC BOTTLE ONLY CRITICAL VALUE NOTED.  VALUE IS CONSISTENT WITH PREVIOUSLY REPORTED AND CALLED VALUE.    Culture (A)  Final    ENTEROCOCCUS FAECALIS SUSCEPTIBILITIES PERFORMED ON PREVIOUS CULTURE WITHIN THE LAST 5 DAYS. Performed at [facility], [place] [address] [place], [place] [address]    Report Status 08/21/2022 FINAL  Final  Culture, blood (Routine x 2)     Status: Abnormal   Collection Time: 08/19/22 12:08 PM   Specimen: Left Antecubital;  Blood  Result Value Ref Range Status   Specimen Description   Final    LEFT ANTECUBITAL BOTTLES DRAWN AEROBIC AND ANAEROBIC Performed at [facility], [address] [place], [place] [address]    Special Requests   Final    Blood Culture adequate volume Performed at [facility], [address], [place], [place] [address]    Culture  Setup Time   Final    GRAM POSITIVE COCCI Gram Stain Report Called to,Read Back By and Verified With: M. FAULK @ 0812 BY STEPHTR 08/20/22 AEROBIC BOTTLE ONLY CRITICAL RESULT CALLED TO, READ BACK BY AND VERIFIED WITH: PHARMD S Weakley [id number] AT 1207 BY CM GRAM POSITIVE COCCI ANAEROBIC BOTTLE ONLY CRITICAL VALUE NOTED.  VALUE IS CONSISTENT WITH PREVIOUSLY REPORTED AND CALLED VALUE. Performed at [facility], [address], [place], [place] [address]    Culture ENTEROCOCCUS FAECALIS (A)  Final   Report Status 08/22/2022 FINAL  Final   Organism ID, Bacteria ENTEROCOCCUS FAECALIS  Final      Susceptibility   Enterococcus faecalis - MIC*    AMPICILLIN <=2 SENSITIVE Sensitive     VANCOMYCIN 1 SENSITIVE Sensitive     GENTAMICIN SYNERGY SENSITIVE Sensitive     * ENTEROCOCCUS FAECALIS  Blood Culture ID Panel (Reflexed)     Status: Abnormal   Collection Time: 08/19/22 12:08 PM  Result Value Ref Range Status   Enterococcus faecalis DETECTED (A) NOT DETECTED Final    Comment: CRITICAL RESULT CALLED TO, READ BACK BY AND VERIFIED WITH: PHARMD S HURTH [id number] AT 1207 BY CM    Enterococcus Faecium NOT DETECTED NOT DETECTED Final   Listeria monocytogenes NOT DETECTED NOT DETECTED Final   Staphylococcus species NOT DETECTED NOT DETECTED Final   Staphylococcus aureus (BCID) NOT DETECTED NOT DETECTED Final   Staphylococcus epidermidis NOT DETECTED NOT DETECTED Final   Staphylococcus lugdunensis NOT DETECTED NOT DETECTED Final   Streptococcus species NOT DETECTED NOT DETECTED Final   Streptococcus agalactiae NOT DETECTED NOT DETECTED Final   Streptococcus pneumoniae NOT  DETECTED NOT DETECTED Final   Streptococcus pyogenes NOT DETECTED NOT DETECTED Final   A.calcoaceticus-baumannii NOT DETECTED NOT DETECTED Final   Bacteroides fragilis NOT DETECTED NOT DETECTED Final  Enterobacterales NOT DETECTED NOT DETECTED Final   Enterobacter cloacae complex NOT DETECTED NOT DETECTED Final   Escherichia coli NOT DETECTED NOT DETECTED Final   Klebsiella aerogenes NOT DETECTED NOT DETECTED Final   Klebsiella oxytoca NOT DETECTED NOT DETECTED Final   Klebsiella pneumoniae NOT DETECTED NOT DETECTED Final   Proteus species NOT DETECTED NOT DETECTED Final   Salmonella species NOT DETECTED NOT DETECTED Final   Serratia marcescens NOT DETECTED NOT DETECTED Final   Haemophilus influenzae NOT DETECTED NOT DETECTED Final   Neisseria meningitidis NOT DETECTED NOT DETECTED Final   Pseudomonas aeruginosa NOT DETECTED NOT DETECTED Final   Stenotrophomonas maltophilia NOT DETECTED NOT DETECTED Final   Candida albicans NOT DETECTED NOT DETECTED Final   Candida auris NOT DETECTED NOT DETECTED Final   Candida glabrata NOT DETECTED NOT DETECTED Final   Candida krusei NOT DETECTED NOT DETECTED Final   Candida parapsilosis NOT DETECTED NOT DETECTED Final   Candida tropicalis NOT DETECTED NOT DETECTED Final   Cryptococcus neoformans/gattii NOT DETECTED NOT DETECTED Final   Vancomycin resistance NOT DETECTED NOT DETECTED Final    Comment: Performed at [facility], [place] [address] [place], [place] [address]  Urine Culture     Status: Abnormal   Collection Time: 08/19/22  1:56 PM   Specimen: In/Out Cath Urine  Result Value Ref Range Status   Specimen Description   Final    IN/OUT CATH URINE Performed at [facility], [address] [place], [place] [address]    Special Requests   Final    NONE Performed at [facility], [address] [place], [place] [address]    Culture >=100,000 COLONIES/mL ENTEROCOCCUS FAECALIS (A)  Final   Report Status 08/22/2022 FINAL  Final    Organism ID, Bacteria ENTEROCOCCUS FAECALIS (A)  Final      Susceptibility   Enterococcus faecalis - MIC*    AMPICILLIN <=2 SENSITIVE Sensitive     NITROFURANTOIN <=16 SENSITIVE Sensitive     VANCOMYCIN 1 SENSITIVE Sensitive     * >=100,000 COLONIES/mL ENTEROCOCCUS FAECALIS  Culture, blood (Routine X 2) w Reflex to ID Panel     Status: None (Preliminary result)   Collection Time: 08/20/22  3:54 PM   Specimen: BLOOD LEFT HAND  Result Value Ref Range Status   Specimen Description   Final    BLOOD LEFT HAND BOTTLES DRAWN AEROBIC AND ANAEROBIC   Special Requests Blood Culture adequate volume  Final   Culture   Final    NO GROWTH 3 DAYS Performed at [facility], [address], [place], [place] [address]    Report Status PENDING  Incomplete  Culture, blood (Routine X 2) w Reflex to ID Panel     Status: None (Preliminary result)   Collection Time: 08/20/22  3:54 PM   Specimen: BLOOD RIGHT HAND  Result Value Ref Range Status   Specimen Description   Final    BLOOD RIGHT HAND BOTTLES DRAWN AEROBIC AND ANAEROBIC   Special Requests Blood Culture adequate volume  Final   Culture   Final    NO GROWTH 3 DAYS Performed at [facility], [address], [place], [place] [address]    Report Status PENDING  Incomplete  MRSA Next Gen by PCR, Nasal     Status: None   Collection Time: 08/20/22  4:46 PM   Specimen: Nasal Mucosa; Nasal Swab  Result Value Ref Range Status   MRSA by PCR Next Gen NOT DETECTED NOT DETECTED Final  Comment: (NOTE) The GeneXpert MRSA Assay (FDA approved for NASAL specimens only), is one component of a comprehensive MRSA colonization surveillance program. It is not intended to diagnose MRSA infection nor to guide or monitor treatment for MRSA infections. Test performance is not FDA approved in patients less than 2 years old. Performed at [facility], [address], [place], [place] [address]      Scheduled Meds:  ALPRAZolam  1 mg Oral QHS   amiodarone  200 mg  Oral Daily   amoxicillin  1,000 mg Oral Q8H   atorvastatin  80 mg Oral Daily   Chlorhexidine Gluconate Cloth  6 each Topical Daily   finasteride  5 mg Oral Daily   gabapentin  200 mg Oral TID   lubiprostone  24 mcg Oral BID WC   pantoprazole  40 mg Oral BID   polyethylene glycol  17 g Oral BID   tamsulosin  0.4 mg Oral QPC supper   Continuous Infusions:  lactated ringers 125 mL/hr at 08/22/22 1643    Procedures/Studies: ECHOCARDIOGRAM COMPLETE  Result Date: 08/22/2022    ECHOCARDIOGRAM REPORT   Patient Name:   ZAKARIAH URWIN Date of Exam: 08/20/2022 Medical Rec #:  [id number]     Height:       68.0 in Accession #:    [id number]    Weight:       166.0 lb Date of Birth:  [birth year]     BSA:          1.888 m Patient Age:    5 years      BP:           103/42 mmHg Patient Gender: M             HR:           45 bpm. Exam Location:  [facility] Procedure: 2D Echo, Cardiac Doppler and Color Doppler Indications:    Preoperative evaluation  History:        Patient has prior history of Echocardiogram examinations, most                 recent 12/25/2019. CAD, Arrythmias:Atrial Fibrillation; Risk                 Factors:Sleep Apnea and GERD.  Sonographer:    Bernadene Person RDCS Referring Phys: [place]  1. Left ventricular ejection fraction, by estimation, is 55 to 60%. The left ventricle has normal function. The left ventricle has no regional wall motion abnormalities. Left ventricular diastolic parameters are consistent with Grade II diastolic dysfunction (pseudonormalization). Elevated left atrial pressure.  2. Right ventricular systolic function is normal. The right ventricular size is mildly enlarged. There is mildly elevated pulmonary artery systolic pressure.  3. Left atrial size was moderately dilated.  4. Right atrial size was moderately dilated.  5. The mitral valve is normal in structure. No evidence of mitral valve regurgitation. No evidence of mitral stenosis.  6. The aortic  valve has an indeterminant number of cusps. Aortic valve regurgitation is not visualized. No aortic stenosis is present.  7. The inferior vena cava is dilated in size with <50% respiratory variability, suggesting right atrial pressure of 15 mmHg. FINDINGS  Left Ventricle: Left ventricular ejection fraction, by estimation, is 55 to 60%. The left ventricle has normal function. The left ventricle has no regional wall motion abnormalities. The left ventricular internal cavity size was normal in size. There is  no left ventricular hypertrophy. Left ventricular  diastolic parameters are consistent with Grade II diastolic dysfunction (pseudonormalization). Elevated left atrial pressure. Right Ventricle: The right ventricular size is mildly enlarged. Right vetricular wall thickness was not well visualized. Right ventricular systolic function is normal. There is mildly elevated pulmonary artery systolic pressure. The tricuspid regurgitant  velocity is 2.64 m/s, and with an assumed right atrial pressure of 15 mmHg, the estimated right ventricular systolic pressure is 16.1 mmHg. Left Atrium: Left atrial size was moderately dilated. Right Atrium: Right atrial size was moderately dilated. Pericardium: There is no evidence of pericardial effusion. Mitral Valve: The mitral valve is normal in structure. No evidence of mitral valve regurgitation. No evidence of mitral valve stenosis. Tricuspid Valve: The tricuspid valve is not well visualized. Tricuspid valve regurgitation is mild . No evidence of tricuspid stenosis. Aortic Valve: The aortic valve has an indeterminant number of cusps. Aortic valve regurgitation is not visualized. No aortic stenosis is present. Aortic valve mean gradient measures 3.0 mmHg. Aortic valve peak gradient measures 6.1 mmHg. Aortic valve area, by VTI measures 2.71 cm. Pulmonic Valve: The pulmonic valve was not well visualized. Pulmonic valve regurgitation is mild. No evidence of pulmonic stenosis. Aorta:  The aortic root is normal in size and structure. Venous: The inferior vena cava is dilated in size with less than 50% respiratory variability, suggesting right atrial pressure of 15 mmHg. IAS/Shunts: The interatrial septum was not well visualized.  LEFT VENTRICLE PLAX 2D LVIDd:         5.00 cm      Diastology LVIDs:         2.80 cm      LV e' medial:    5.25 cm/s LV PW:         0.90 cm      LV E/e' medial:  22.3 LV IVS:        1.00 cm      LV e' lateral:   7.50 cm/s LVOT diam:     2.10 cm      LV E/e' lateral: 15.6 LV SV:         90 LV SV Index:   48 LVOT Area:     3.46 cm  LV Volumes (MOD) LV vol d, MOD A2C: 96.1 ml LV vol d, MOD A4C: 106.0 ml LV vol s, MOD A2C: 35.5 ml LV vol s, MOD A4C: 41.1 ml LV SV MOD A2C:     60.6 ml LV SV MOD A4C:     106.0 ml LV SV MOD BP:      61.8 ml RIGHT VENTRICLE RV S prime:     8.93 cm/s TAPSE (M-mode): 1.4 cm LEFT ATRIUM             Index        RIGHT ATRIUM           Index LA diam:        4.70 cm 2.49 cm/m   RA Area:     27.10 cm LA Vol (A2C):   84.8 ml 44.91 ml/m  RA Volume:   92.40 ml  48.94 ml/m LA Vol (A4C):   84.6 ml 44.81 ml/m LA Biplane Vol: 87.3 ml 46.24 ml/m  AORTIC VALVE                    PULMONIC VALVE AV Area (Vmax):    2.97 cm     PR End Diast Vel: 4.08 msec AV Area (Vmean):   2.77 cm AV Area (VTI):  2.71 cm AV Vmax:           123.79 cm/s AV Vmean:          82.428 cm/s AV VTI:            0.331 m AV Peak Grad:      6.1 mmHg AV Mean Grad:      3.0 mmHg LVOT Vmax:         106.00 cm/s LVOT Vmean:        66.000 cm/s LVOT VTI:          0.259 m LVOT/AV VTI ratio: 0.78  AORTA Ao Root diam: 3.10 cm Ao Asc diam:  3.40 cm MITRAL VALVE                TRICUSPID VALVE MV Area (PHT): 3.06 cm     TR Peak grad:   27.9 mmHg MV Decel Time: 248 msec     TR Vmax:        264.00 cm/s MV E velocity: 117.00 cm/s MV A velocity: 26.20 cm/s   SHUNTS MV E/A ratio:  4.47         Systemic VTI:  0.26 m                             Systemic Diam: 2.10 cm Carlyle Dolly MD Electronically  signed by Carlyle Dolly MD Signature Date/Time: 08/22/2022/8:23:43 AM    Final    NM Hepato W/EF  Result Date: 08/21/2022 CLINICAL DATA:  Cholelithiasis question cholecystitis EXAM: NUCLEAR MEDICINE HEPATOBILIARY IMAGING WITH GALLBLADDER EF TECHNIQUE: Sequential images of the abdomen were obtained out to 60 minutes following intravenous administration of radiopharmaceutical. After slow intravenous infusion of 1.5 micrograms Cholecystokinin, gallbladder ejection fraction was determined. RADIOPHARMACEUTICALS:  5.5 mCi Tc-32mCholetec IV COMPARISON:  Ultrasound abdomen 08/20/2022 FINDINGS: Normal tracer extraction from bloodstream indicating normal hepatocellular function. Normal excretion of tracer into biliary tree. Gallbladder visualized at 18 min. Small bowel visualized at 15 min. No hepatic retention of tracer. Subjectively normal emptying of tracer from gallbladder following CCK administration. Calculated gallbladder ejection fraction is 99%, normal. Patient reported no symptoms following CCK administration. Normal gallbladder ejection fraction following CCK stimulation is greater than 40% at 1 hour. IMPRESSION: Normal exam. Electronically Signed   By: MLavonia DanaM.D.   On: 08/21/2022 14:37   [place]Abdomen Limited RUQ (LIVER/GB)  Result Date: 08/20/2022 CLINICAL DATA:  Cholelithiasis. EXAM: ULTRASOUND ABDOMEN LIMITED RIGHT UPPER QUADRANT COMPARISON:  CT scan of August 19, 2022. Ultrasound of June 26, 2022. FINDINGS: Gallbladder: Cholelithiasis is noted with severe gallbladder wall thickening measuring 13 mm. No sonographic Murphy's sign is noted. Moderate amount of pericholecystic fluid is noted. Common bile duct: Diameter: 4 mm which is within normal limits. Liver: No focal lesion identified. Within normal limits in parenchymal echogenicity. Portal vein is patent on color Doppler imaging with normal direction of blood flow towards the liver. Other: Minimal ascites is noted. IMPRESSION: Cholelithiasis is  noted with severe gallbladder wall thickening and pericholecystic fluid highly concerning for acute cholecystitis. These results will be called to the ordering clinician or representative by the Radiologist Assistant, and communication documented in the PACS or zVision Dashboard. Electronically Signed   By: JMarijo ConceptionM.D.   On: 08/20/2022 10:48   DG CHEST PORT 1 VIEW  Result Date: 08/20/2022 CLINICAL DATA:  Difficulty breathing.  Hypoxia. EXAM: PORTABLE CHEST 1 VIEW COMPARISON:  August 19, 2022. FINDINGS: Stable cardiomegaly.  Sternotomy wires are noted. Mild bibasilar subsegmental atelectasis is noted. Bony thorax is unremarkable. IMPRESSION: Mild bibasilar subsegmental atelectasis. Electronically Signed   By: Marijo Conception M.D.   On: 08/20/2022 09:53   CT ABDOMEN PELVIS W CONTRAST  Result Date: 08/19/2022 CLINICAL DATA:  Sepsis. Vomiting. [age] year. Patient reports surgery 1 month ago on penis. EXAM: CT ABDOMEN AND PELVIS WITH CONTRAST TECHNIQUE: Multidetector CT imaging of the abdomen and pelvis was performed using the standard protocol following bolus administration of intravenous contrast. RADIATION DOSE REDUCTION: This exam was performed according to the departmental dose-optimization program which includes automated exposure control, adjustment of the mA and/or kV according to patient size and/or use of iterative reconstruction technique. CONTRAST:  141m OMNIPAQUE IOHEXOL 350 MG/ML SOLN COMPARISON:  Abdominopelvic CT 02/06/2022 FINDINGS: Lower chest: Dependent atelectasis with trace bilateral pleural thickening, no significant effusion. Cardiomegaly with coronary artery calcifications. Prior median sternotomy. Hepatobiliary: There is no focal hepatic lesion. Gallbladder is distended. Patient motion artifact through the gallbladder limits assessment for pericholecystic inflammation. There is no definite wall thickening. 15 mm rounded high-density in the gallbladder likely represent gallstones,  with compared with 06/26/2022 abdominal ultrasound. There is diffuse periportal edema. No biliary dilatation. Pancreas: No ductal dilatation or inflammation. Spleen: Tiny subcentimeter hypodensity in the lower spleen, nonspecific. Spleen is normal in size. Adrenals/Urinary Tract: Normal adrenal glands. Mild prominence of the right renal collecting system without frank hydronephrosis. Mild thinning of bilateral renal parenchyma calcification in the right renal hilum is felt to be vascular rather than nonobstructing stone. Elongated urinary bladder which extends to the level of the umbilicus. No bladder wall thickening. Stomach/Bowel: No bowel obstruction or inflammatory change. Moderate volume of colonic stool. There is stool distending the rectum. No rectal or colonic wall thickening. Stomach is decompressed. Appendix not visualized. Vascular/Lymphatic: Aortic and branch atherosclerosis. No aortic aneurysm. Patent portal vein. Prominent left inguinal node measures 10 mm short axis. This is slightly more prominent than on prior exam. Reproductive: Surgical clips in the prostate which is prominent size. No evidence of prostatic fluid collection. The included portion of the penis and urethra are unremarkable. Other: No ascites. No free air. Tiny fat containing umbilical hernia. Minimal fat in the left inguinal canal. Musculoskeletal: Postsurgical change in the lumbar spine. Spinal stimulator in place. No focal bone lesions. IMPRESSION: 1. Distended gallbladder with gallstones. Patient motion artifact through the gallbladder limits assessment for pericholecystic inflammation. Consider right upper quadrant ultrasound for further evaluation as clinically indicated. 2. Moderate volume of colonic stool with stool distending the rectum, suggesting constipation. 3. Elongated urinary bladder which extends to the level of the umbilicus, can be seen with urinary retention. No bladder wall thickening. 4. Prominent left inguinal  node is slightly more prominent than on prior exam, likely reactive. 5. Additional chronic findings as described. Aortic Atherosclerosis (ICD10-I70.0). Electronically Signed   By: MKeith RakeM.D.   On: 08/19/2022 16:44   DG Chest Port 1 View  Result Date: 08/19/2022 CLINICAL DATA:  Fever.  Recent surgery. EXAM: PORTABLE CHEST 1 VIEW COMPARISON:  01/01/2021. FINDINGS: Stable changes from prior cardiac surgery. No mediastinal or hilar masses. Clear lungs.  No pleural effusion or pneumothorax. Skeletal structures are grossly intact. IMPRESSION: No active disease. Electronically Signed   By: DLajean ManesM.D.   On: 08/19/2022 12:30    DOrson Eva DO  [facility]  If 7PM-7AM, please contact night-coverage [email or web address] Password [email or web address] 08/23/2022, 1:46 PM   LOS: 4 days

## 2022-08-23 NOTE — Assessment & Plan Note (Signed)
Initially bradycardic and amio stopped HR improved>>amio restarted AC held due to hematuria Restart apixaban now with hematuria resolved

## 2022-08-24 DIAGNOSIS — N3001 Acute cystitis with hematuria: Secondary | ICD-10-CM | POA: Diagnosis not present

## 2022-08-24 DIAGNOSIS — R7881 Bacteremia: Secondary | ICD-10-CM | POA: Diagnosis not present

## 2022-08-24 DIAGNOSIS — I480 Paroxysmal atrial fibrillation: Secondary | ICD-10-CM | POA: Diagnosis not present

## 2022-08-24 DIAGNOSIS — A419 Sepsis, unspecified organism: Secondary | ICD-10-CM | POA: Diagnosis not present

## 2022-08-24 LAB — COMPREHENSIVE METABOLIC PANEL
ALT: 45 U/L — ABNORMAL HIGH (ref 0–44)
AST: 34 U/L (ref 15–41)
Albumin: 2.4 g/dL — ABNORMAL LOW (ref 3.5–5.0)
Alkaline Phosphatase: 82 U/L (ref 38–126)
Anion gap: 4 — ABNORMAL LOW (ref 5–15)
BUN: <5 mg/dL — ABNORMAL LOW (ref 8–23)
CO2: 24 mmol/L (ref 22–32)
Calcium: 8.4 mg/dL — ABNORMAL LOW (ref 8.9–10.3)
Chloride: 111 mmol/L (ref 98–111)
Creatinine, Ser: 0.72 mg/dL (ref 0.61–1.24)
GFR, Estimated: >60 mL/min (ref >60)
Glucose, Bld: 97 mg/dL (ref 70–99)
Potassium: 3.7 mmol/L (ref 3.5–5.1)
Sodium: 139 mmol/L (ref 135–145)
Total Bilirubin: 1 mg/dL (ref 0.3–1.2)
Total Protein: 5.2 g/dL — ABNORMAL LOW (ref 6.5–8.1)

## 2022-08-24 MED ORDER — AMOXICILLIN 500 MG PO CAPS: 1000.0000 mg | ORAL_CAPSULE | Freq: Three times a day (TID) | ORAL | 0 refills | Status: DC

## 2022-08-24 NOTE — Progress Notes (Signed)
Nsg Discharge Note  Admit Date:  08/19/2022 Discharge date: 08/24/2022   Jeannene Patella to be D/C'd Home per MD order.  AVS completed.  Copy for chart, and copy for patient signed, and dated. Patient/caregiver able to verbalize understanding.  Discharge Medication: Allergies as of 08/24/2022       Reactions   Tylenol [acetaminophen] Other (See Comments)   Stomach pain    Morphine And Related Nausea And Vomiting   Propofol Nausea And Vomiting        Medication List     STOP taking these medications    nitrofurantoin (macrocrystal-monohydrate) 100 MG capsule Commonly known as: MACROBID   Simethicone 180 MG Caps   sulfamethoxazole-trimethoprim 800-160 MG tablet Commonly known as: BACTRIM DS       TAKE these medications    ALPRAZolam 1 MG tablet Commonly known as: XANAX Take 1 mg by mouth at bedtime.   amiodarone 200 MG tablet Commonly known as: PACERONE TAKE 1 TABLET BY MOUTH EVERY DAY   amoxicillin 500 MG capsule Commonly known as: AMOXIL Take 2 capsules (1,000 mg total) by mouth every 8 (eight) hours.   apixaban 5 MG Tabs tablet Commonly known as: ELIQUIS Take 1 tablet (5 mg total) by mouth 2 (two) times daily. *Restart 04/28/2022* What changed: additional instructions   aspirin EC 81 MG tablet Take 1 tablet (81 mg total) by mouth daily.   atorvastatin 80 MG tablet Commonly known as: LIPITOR Take 1 tablet (80 mg total) by mouth daily.   B-12 PO Take 1 tablet by mouth daily.   docusate sodium 250 MG capsule Commonly known as: COLACE Take 250 mg by mouth daily.   FIBER-CAPS PO Take 2 capsules by mouth daily.   finasteride 5 MG tablet Commonly known as: PROSCAR TAKE 1 TABLET (5 MG TOTAL) BY MOUTH DAILY.   furosemide 20 MG tablet Commonly known as: LASIX Take 1 tablet (20 mg total) by mouth daily.   gabapentin 400 MG capsule Commonly known as: NEURONTIN One three times a day What changed:  how much to take how to take this when to take  this additional instructions   lubiprostone 24 MCG capsule Commonly known as: Amitiza Take 1 capsule (24 mcg total) by mouth 2 (two) times daily with a meal.   nitroGLYCERIN 0.4 MG SL tablet Commonly known as: NITROSTAT Place 1 tablet (0.4 mg total) under the tongue every 5 (five) minutes as needed for chest pain.   Omega-3 1000 MG Caps Take 1,000 mg by mouth daily.   pantoprazole 40 MG tablet Commonly known as: PROTONIX TAKE 1 TABLET BY MOUTH TWICE A DAY (30 TO 60 MINUTES BEFORE 1ST AND LAST MEALS OF THE DAY) What changed:  how much to take when to take this additional instructions   potassium chloride 10 MEQ tablet Commonly known as: KLOR-CON M Take 10 mEq by mouth daily.   silodosin 8 MG Caps capsule Commonly known as: RAPAFLO Take 1 capsule (8 mg total) by mouth at bedtime.   Systane Balance 0.6 % Soln Generic drug: Propylene Glycol Place 1 drop into both eyes daily as needed (dry eyes).   traMADol 50 MG tablet Commonly known as: ULTRAM Take 1-2 tablets (50-100 mg total) by mouth every 4 (four) hours as needed.   Vitamin D3 10 MCG (400 UNIT) Caps Take 400 Units by mouth daily.   vitamin E 200 UNIT capsule Take 200 Units by mouth daily.   ZINC 15 PO Take 15 mg by mouth daily.  Discharge Assessment: Vitals:   08/24/22 1010 08/24/22 1100  BP:  (!) 143/63  Pulse: (!) 46 (!) 53  Resp: (!) 25 19  Temp:    SpO2: (!) 89% 96%   Skin clean, dry and intact without evidence of skin break down, no evidence of skin tears noted. IV catheter discontinued intact. Site without signs and symptoms of complications - no redness or edema noted at insertion site, patient denies c/o pain - only slight tenderness at site.  Dressing with slight pressure applied.  D/c Instructions-Education: Discharge instructions given to patient/family with verbalized understanding. D/c education completed with patient/family including follow up instructions, medication list, d/c  activities limitations if indicated, with other d/c instructions as indicated by MD - patient able to verbalize understanding, all questions fully answered. Patient instructed to return to ED, call 911, or call MD for any changes in condition.  Patient escorted via [place], and D/C home via private auto. Chivon Lepage catheter stayed in place til follow up with urology.   Carney Corners, RN 08/24/2022 11:46 AM

## 2022-08-25 LAB — CULTURE, BLOOD (ROUTINE X 2)
Culture: NO GROWTH
Culture: NO GROWTH
Special Requests: ADEQUATE
Special Requests: ADEQUATE

## 2022-08-29 ENCOUNTER — Ambulatory Visit: Payer: Medicare Other | Admitting: Urology

## 2022-08-29 VITALS — BP 148/75 | HR 51

## 2022-08-29 DIAGNOSIS — R339 Retention of urine, unspecified: Secondary | ICD-10-CM | POA: Diagnosis not present

## 2022-08-29 LAB — BLADDER SCAN AMB NON-IMAGING: Scan Result: 33

## 2022-08-29 NOTE — Progress Notes (Signed)
Fill and Pull Catheter Removal  Patient is present today for a catheter removal.  Patient was cleaned and prepped in a sterile fashion 2106m of sterile water/ saline was instilled into the bladder when the patient felt the urge to urinate. 189mof water was then drained from the balloon.  A 18FR coude foley cath was removed from the bladder no complications were noted .  Patient as then given some time to void on their own.  Patient can void  12586mn their own after some time.  Patient tolerated well.  Performed by:  AmaEstill Bamberg Follow up/ Additional notes: will return this after noon for PVR per Dr. McKAlyson Ingles

## 2022-08-29 NOTE — Progress Notes (Signed)
08/29/2022 10:03 AM   Christopher [name] 12/04/1946 [id number]  Referring provider: [name], MD [address] [place],  [place] [address]  Followup urinary retention and UTI   HPI: Christopher [name] is a 76yo here for followup for UTI and urinary retention. He was hospitalized 9/3 from sepsis from a UTI. He was discharged 9/8 with a foley and 10 days of amoxicillin. Currently he has an indwelling foley. Voiding trial passed today   PMH: Past Medical History:  Diagnosis Date   A-fib [facility])    Arthritis    "all over my body" (06/17/2017)   BPH (benign prostatic hyperplasia)    takes Proscar daily   CAD (coronary artery disease)    a. s/p CABG in 09/2019 with SVG-PDA-OM and LIMA-LAD with LAA excision   Chronic back pain    DDD; "upper and lower" (06/17/2017)   Constipation    takes Colace daily   Coronary artery disease    DDD (degenerative disc disease), cervical    DVT (deep venous thrombosis) (HCC)    left peroneal vein 07/2017 (in setting of recent left TKA)   Dysrhythmia    history of Atrial fibrillation, no long has this   Enlarged prostate    GERD (gastroesophageal reflux disease)    takes Nexium daily   H/O hiatal hernia    History of colon polyps    benign   History of gastric ulcer    Insomnia    takes Xanax nightly   Joint capsule tear    left knee   Joint pain    Joint swelling    Macular degeneration    Muscle spasm of back    takes Valium daily as needed   PAF (paroxysmal atrial fibrillation) (HCC)    Peripheral neuropathy    takes Gabapentin daily   PONV (postoperative nausea and vomiting)    Pre-diabetes    Sleep apnea    pt does not wear a cpap   Urinary hesitancy    Weakness    numbness and tingling    Surgical History: Past Surgical History:  Procedure Laterality Date   ANTERIOR CERVICAL DECOMP/DISCECTOMY FUSION  ~ 1991   "took bone from hip"   ANTERIOR CERVICAL DECOMP/DISCECTOMY FUSION  X 2   "put plate in"   ANTERIOR CERVICAL DISCECTOMY   <11/2005    5-6 and 4-5./notes 05/02/2011   APPENDECTOMY     BACK SURGERY     BIOPSY  05/30/2018   Procedure: BIOPSY;  Surgeon: [name], MD;  [facility];  Service: Endoscopy;;  esophagus   BIOPSY  01/09/2022   Procedure: BIOPSY;  Surgeon: Harvel Quale, MD;  [facility];  Service: Gastroenterology;;   CARDIAC CATHETERIZATION     3-4 per pt   CARPAL TUNNEL RELEASE Bilateral 10/2005 - 11/2005   right-left /notes 05/02/2011   CATARACT EXTRACTION W/PHACO Right 05/24/2014   Procedure: CATARACT EXTRACTION PHACO AND INTRAOCULAR LENS PLACEMENT ([place]);  Surgeon: [name], MD;  Location: [facility];  Service: Ophthalmology;  Laterality: Right;  CDE 6.74   CATARACT EXTRACTION W/PHACO Left 06/17/2014   Procedure: CATARACT EXTRACTION PHACO AND INTRAOCULAR LENS PLACEMENT ([facility]);  Surgeon: [name], MD;  Location: [facility];  Service: Ophthalmology;  Laterality: Left;  CDE:4.65   COLONOSCOPY WITH ESOPHAGOGASTRODUODENOSCOPY (EGD) N/A 01/22/2013   Procedure: COLONOSCOPY WITH ESOPHAGOGASTRODUODENOSCOPY (EGD);  Surgeon: [name], MD;  [facility];  Service: Endoscopy;  Laterality: N/A;  140   COLONOSCOPY WITH PROPOFOL  N/A 05/30/2018   diverticulosis and external hemorrhoids   CORONARY ARTERY BYPASS GRAFT  2020   CYSTOSCOPY WITH INSERTION OF UROLIFT N/A 04/16/2022   Procedure: CYSTOSCOPY WITH INSERTION OF UROLIFT;  Surgeon: Cleon Gustin, MD;  Location: [facility];  Service: Urology;  Laterality: N/A;   ESOPHAGOGASTRODUODENOSCOPY (EGD) WITH PROPOFOL N/A 05/30/2018   normal esophagus, presence of possible barretts (bx consistent with reflux changes, GAVE)   ESOPHAGOGASTRODUODENOSCOPY (EGD) WITH PROPOFOL N/A 01/09/2022   Procedure: ESOPHAGOGASTRODUODENOSCOPY (EGD) WITH PROPOFOL;  Surgeon: Harvel Quale, MD;  [facility];  Service: Gastroenterology;  Laterality: N/A;  305, moved up per Leigh Amm   EYE SURGERY Bilateral    "cleaned  film w/laser; since my 1st cataract OR"   JOINT REPLACEMENT     LEFT HEART CATH AND CORONARY ANGIOGRAPHY N/A 02/16/2019   Procedure: LEFT HEART CATH AND CORONARY ANGIOGRAPHY;  Surgeon: Belva Crome, MD;  Location: [place] [facility];  Service: Cardiovascular;  Laterality: N/A;   POSTERIOR LUMBAR FUSION     "put a plate in"   QUADRICEPS TENDON REPAIR Left 07/01/2017   QUADRICEPS TENDON REPAIR Left 07/01/2017   Procedure: REPAIR QUADRICEP TENDON;  Surgeon: [name], MD;  Location: [place];  Service: Orthopedics;  Laterality: Left;   SHOULDER ARTHROSCOPY Bilateral    SPINAL CORD STIMULATOR INSERTION N/A 09/23/2015   Procedure: LUMBAR SPINAL CORD STIMULATOR INSERTION;  Surgeon: [name], MD;  Location: [place] [facility];  Service: Neurosurgery;  Laterality: N/A;  LUMBAR SPINAL CORD STIMULATOR INSERTION   SPINAL CORD STIMULATOR INSERTION N/A 04/25/2022   Procedure: REVISION OF  SPINAL CORD STIMULATOR;  Surgeon: Newman Pies, MD;  Location: [place];  Service: Neurosurgery;  Laterality: N/A;   TOTAL KNEE ARTHROPLASTY Left 06/17/2017   TOTAL KNEE ARTHROPLASTY Left 06/17/2017   Procedure: TOTAL KNEE ARTHROPLASTY;  Surgeon: [name], MD;  Location: [place];  Service: Orthopedics;  Laterality: Left;   TOTAL KNEE ARTHROPLASTY Right 12/16/2017   Procedure: TOTAL KNEE ARTHROPLASTY;  Surgeon: [name], MD;  Location: [place];  Service: Orthopedics;  Laterality: Right;    Home Medications:  Allergies as of 08/29/2022       Reactions   Tylenol [acetaminophen] Other (See Comments)   Stomach pain    Morphine And Related Nausea And Vomiting   Propofol Nausea And Vomiting        Medication List        Accurate as of August 29, 2022 10:03 AM. If you have any questions, ask your nurse or doctor.          ALPRAZolam 1 MG tablet Commonly known as: XANAX Take 1 mg by mouth at bedtime.   amiodarone 200 MG tablet Commonly known as: PACERONE TAKE 1 TABLET BY MOUTH EVERY DAY    amoxicillin 500 MG capsule Commonly known as: AMOXIL Take 2 capsules (1,000 mg total) by mouth every 8 (eight) hours.   apixaban 5 MG Tabs tablet Commonly known as: ELIQUIS Take 1 tablet (5 mg total) by mouth 2 (two) times daily. *Restart 04/28/2022* What changed: additional instructions   aspirin EC 81 MG tablet Take 1 tablet (81 mg total) by mouth daily.   atorvastatin 80 MG tablet Commonly known as: LIPITOR Take 1 tablet (80 mg total) by mouth daily.   B-12 PO Take 1 tablet by mouth daily.   docusate sodium 250 MG capsule Commonly known as: COLACE Take 250 mg by mouth daily.   FIBER-CAPS PO Take 2 capsules by mouth daily.  finasteride 5 MG tablet Commonly known as: PROSCAR TAKE 1 TABLET (5 MG TOTAL) BY MOUTH DAILY.   furosemide 20 MG tablet Commonly known as: LASIX Take 1 tablet (20 mg total) by mouth daily.   gabapentin 400 MG capsule Commonly known as: NEURONTIN One three times a day What changed:  how much to take how to take this when to take this additional instructions   lubiprostone 24 MCG capsule Commonly known as: Amitiza Take 1 capsule (24 mcg total) by mouth 2 (two) times daily with a meal.   nitroGLYCERIN 0.4 MG SL tablet Commonly known as: NITROSTAT Place 1 tablet (0.4 mg total) under the tongue every 5 (five) minutes as needed for chest pain.   Omega-3 1000 MG Caps Take 1,000 mg by mouth daily.   pantoprazole 40 MG tablet Commonly known as: PROTONIX TAKE 1 TABLET BY MOUTH TWICE A DAY (30 TO 60 MINUTES BEFORE 1ST AND LAST MEALS OF THE DAY) What changed:  how much to take when to take this additional instructions   potassium chloride 10 MEQ tablet Commonly known as: KLOR-CON M Take 10 mEq by mouth daily.   silodosin 8 MG Caps capsule Commonly known as: RAPAFLO Take 1 capsule (8 mg total) by mouth at bedtime.   Systane Balance 0.6 % Soln Generic drug: Propylene Glycol Place 1 drop into both eyes daily as needed (dry eyes).    traMADol 50 MG tablet Commonly known as: ULTRAM Take 1-2 tablets (50-100 mg total) by mouth every 4 (four) hours as needed.   Vitamin D3 10 MCG (400 UNIT) Caps Take 400 Units by mouth daily.   vitamin E 200 UNIT capsule Take 200 Units by mouth daily.   ZINC 15 PO Take 15 mg by mouth daily.        Allergies:  Allergies  Allergen Reactions   Tylenol [Acetaminophen] Other (See Comments)    Stomach pain    Morphine And Related Nausea And Vomiting   Propofol Nausea And Vomiting    Family History: No family history on file.  Social History:  reports that he quit smoking about 35 years ago. His smoking use included cigarettes. He has a 30.00 pack-year smoking history. He has quit using smokeless tobacco.  His smokeless tobacco use included chew. He reports current alcohol use. He reports that he does not use drugs.  ROS: All other review of systems were reviewed and are negative except what is noted above in HPI  Physical Exam: BP (!) 148/75   Pulse (!) 51   Constitutional:  Alert and oriented, No acute distress. HEENT: [place] AT, moist mucus membranes.  Trachea midline, no masses. Cardiovascular: No clubbing, cyanosis, or edema. Respiratory: Normal respiratory effort, no increased work of breathing. GI: Abdomen is soft, nontender, nondistended, no abdominal masses GU: No CVA tenderness.  Lymph: No cervical or inguinal lymphadenopathy. Skin: No rashes, bruises or suspicious lesions. Neurologic: Grossly intact, no focal deficits, moving all 4 extremities. Psychiatric: Normal mood and affect.  Laboratory Data: Lab Results  Component Value Date   WBC 5.7 08/23/2022   HGB 10.3 (L) 08/23/2022   HCT 31.1 (L) 08/23/2022   MCV 89.9 08/23/2022   PLT 157 08/23/2022    Lab Results  Component Value Date   CREATININE 0.72 08/24/2022    No results found for: "PSA"  No results found for: "TESTOSTERONE"  Lab Results  Component Value Date   HGBA1C 5.6 12/31/2020     Urinalysis    Component Value Date/Time  COLORURINE YELLOW 08/19/2022 1356   APPEARANCEUR HAZY (A) 08/19/2022 1356   APPEARANCEUR Cloudy (A) 08/01/2022 1133   LABSPEC 1.014 08/19/2022 1356   PHURINE 5.0 08/19/2022 1356   GLUCOSEU NEGATIVE 08/19/2022 1356   HGBUR LARGE (A) 08/19/2022 1356   BILIRUBINUR NEGATIVE 08/19/2022 1356   BILIRUBINUR Negative 08/01/2022 1133   KETONESUR NEGATIVE 08/19/2022 1356   PROTEINUR NEGATIVE 08/19/2022 1356   UROBILINOGEN 0.2 03/07/2014 2103   NITRITE NEGATIVE 08/19/2022 1356   LEUKOCYTESUR SMALL (A) 08/19/2022 1356    Lab Results  Component Value Date   LABMICR See below: 08/01/2022   WBCUA >30 (A) 08/01/2022   LABEPIT 0-10 08/01/2022   MUCUS Present 06/27/2022   BACTERIA RARE (A) 08/19/2022    Pertinent Imaging:  No results found for this or any previous visit.  No results found for this or any previous visit.  No results found for this or any previous visit.  No results found for this or any previous visit.  No results found for this or any previous visit.  No results found for this or any previous visit.  No results found for this or any previous visit.  Results for orders placed during the hospital encounter of 02/21/17  CT RENAL STONE STUDY  Narrative CLINICAL DATA:  Right flank pain for 5 days, history of appendectomy  EXAM: CT ABDOMEN AND PELVIS WITHOUT CONTRAST  TECHNIQUE: Multidetector CT imaging of the abdomen and pelvis was performed following the standard protocol without IV contrast.  COMPARISON:  None.  FINDINGS: Lower chest: Lung bases shows no acute findings. Partially visualized lower thoracic spinal canal stimulator wires  Hepatobiliary: Unenhanced liver shows no biliary ductal dilatation. Question tiny noncalcified gallstones or sludge within dependent gallbladder the largest measures 3 mm.  Pancreas: Unenhanced pancreas with normal appearance.  Spleen: Unenhanced spleen with normal  appearance.  Adrenals/Urinary Tract: No adrenal gland mass. Unenhanced kidneys are symmetrical in size. No hydronephrosis or hydroureter. No nephrolithiasis. No calcified ureteral calculi. No calcified calculi are noted within urinary bladder. Mild thickening of urinary bladder wall. Mild cystitis or chronic inflammation cannot be excluded.  Stomach/Bowel: There is no small bowel obstruction. The study is limited without oral or IV contrast. There is some gas in distal small bowel loops without thickening of small bowel wall. Minimal ileus cannot be excluded. There is no small bowel obstruction. No transition point in caliber of small bowel. No pericecal inflammation. The terminal ileum is unremarkable. The patient is status post appendectomy. Some colonic stool and gas noted within right colon. There is some colonic stool and gas within descending colon and sigmoid colon. No distal colonic obstruction. No definite evidence of colitis or diverticulitis.  Vascular/Lymphatic: Atherosclerotic calcifications of abdominal aorta and iliac arteries. No adenopathy is noted. No aortic aneurysm.  Reproductive: Mild enlarged prostate gland with indentation of urinary bladder base. Prostate gland measures 3.8 by 4.3 cm.  Other: There is a left inguinal scrotal canal hernia containing fat measures 1.6 cm without evidence of acute complication. Small nonspecific bilateral inguinal lymph nodes are noted. The largest left inguinal lymph node measures 6 mm short-axis. The largest right inguinal lymph node measures 7.5 mm short-axis. These are not pathologic by size criteria.  Musculoskeletal: No destructive bony lesions are noted. Sagittal images of the spine shows degenerative changes thoracolumbar spine. Posterior fusion noted lower lumbar spine at L4-L5 level with anatomic alignment.  IMPRESSION: 1. No nephrolithiasis. No hydronephrosis or hydroureter. Question tiny layering sludge or  noncalcified gallstones within gallbladder the largest  measures 3 mm. 2. No calcified calculi are noted within urinary bladder. Mild thickening of urinary bladder wall. Mild cystitis or chronic inflammation cannot be excluded. Clinical correlation is necessary. 3. Some gas noted within distal small bowel loops without significant small bowel distension. No transition point in caliber of small bowel. Mild ileus cannot be excluded. No definite evidence of small bowel obstruction. 4. No pericecal inflammation. The patient is status post appendectomy. Unremarkable terminal ileum. Moderate stool noted in cecum and proximal right colon. 5. No calcified ureteral calculi are noted. 6. Mild enlarged prostate gland with indentation of the bladder base. 7. Small nonspecific bilateral inguinal lymph nodes are noted. There is a small left inguinal scrotal canal hernia containing fat measures 1.6 cm no evidence of acute complication.   Electronically Signed By: Lahoma Crocker M.D. On: 02/22/2017 09:02   Assessment & Plan:    1. Urinary retention -Voiding trial passed today. RTC 2 days for PVR   No follow-ups on file.  Nicolette Bang, MD  [facility] [place]

## 2022-08-31 ENCOUNTER — Ambulatory Visit: Payer: Medicare Other | Admitting: Urology

## 2022-08-31 VITALS — BP 147/74 | HR 54

## 2022-08-31 DIAGNOSIS — R339 Retention of urine, unspecified: Secondary | ICD-10-CM | POA: Diagnosis not present

## 2022-08-31 DIAGNOSIS — N3000 Acute cystitis without hematuria: Secondary | ICD-10-CM | POA: Diagnosis not present

## 2022-08-31 DIAGNOSIS — N401 Enlarged prostate with lower urinary tract symptoms: Secondary | ICD-10-CM

## 2022-08-31 LAB — URINALYSIS, ROUTINE W REFLEX MICROSCOPIC
Bilirubin, UA: NEGATIVE
Glucose, UA: NEGATIVE
Ketones, UA: NEGATIVE
Leukocytes,UA: NEGATIVE
Nitrite, UA: NEGATIVE
Specific Gravity, UA: 1.015 (ref 1.005–1.030)
Urobilinogen, Ur: 0.2 mg/dL (ref 0.2–1.0)
pH, UA: 5.5 (ref 5.0–7.5)

## 2022-08-31 LAB — MICROSCOPIC EXAMINATION
Bacteria, UA: NONE SEEN
Renal Epithel, UA: NONE SEEN /hpf
WBC, UA: NONE SEEN /hpf (ref 0–5)

## 2022-08-31 LAB — BLADDER SCAN AMB NON-IMAGING: Scan Result: 18

## 2022-08-31 NOTE — Progress Notes (Unsigned)
post void residual=18 

## 2022-08-31 NOTE — Progress Notes (Unsigned)
08/31/2022 8:41 AM   Christopher [name] 1946-05-18 [id number]  Referring provider: [name], [place] [address] [place],  [place] [address]  No chief complaint on file.   HPI:    PMH: Past Medical History:  Diagnosis Date   A-fib [facility])    Arthritis    "all over my body" (06/17/2017)   BPH (benign prostatic hyperplasia)    takes Proscar daily   CAD (coronary artery disease)    a. s/p CABG in 09/2019 with SVG-PDA-OM and LIMA-LAD with LAA excision   Chronic back pain    DDD; "upper and lower" (06/17/2017)   Constipation    takes Colace daily   Coronary artery disease    DDD (degenerative disc disease), cervical    DVT (deep venous thrombosis) (HCC)    left peroneal vein 07/2017 (in setting of recent left TKA)   Dysrhythmia    history of Atrial fibrillation, no long has this   Enlarged prostate    GERD (gastroesophageal reflux disease)    takes Nexium daily   H/O hiatal hernia    History of colon polyps    benign   History of gastric ulcer    Insomnia    takes Xanax nightly   Joint capsule tear    left knee   Joint pain    Joint swelling    Macular degeneration    Muscle spasm of back    takes Valium daily as needed   PAF (paroxysmal atrial fibrillation) (HCC)    Peripheral neuropathy    takes Gabapentin daily   PONV (postoperative nausea and vomiting)    Pre-diabetes    Sleep apnea    pt does not wear a cpap   Urinary hesitancy    Weakness    numbness and tingling    Surgical History: Past Surgical History:  Procedure Laterality Date   ANTERIOR CERVICAL DECOMP/DISCECTOMY FUSION  ~ 1991   "took bone from hip"   ANTERIOR CERVICAL DECOMP/DISCECTOMY FUSION  X 2   "put plate in"   ANTERIOR CERVICAL DISCECTOMY  <11/2005    5-6 and 4-5./notes 05/02/2011   APPENDECTOMY     BACK SURGERY     BIOPSY  05/30/2018   Procedure: BIOPSY;  Surgeon: [name], MD;  [facility];  Service: Endoscopy;;  esophagus   BIOPSY  01/09/2022   Procedure:  BIOPSY;  Surgeon: Harvel Quale, MD;  [facility];  Service: Gastroenterology;;   CARDIAC CATHETERIZATION     3-4 per pt   CARPAL TUNNEL RELEASE Bilateral 10/2005 - 11/2005   right-left /notes 05/02/2011   CATARACT EXTRACTION W/PHACO Right 05/24/2014   Procedure: CATARACT EXTRACTION PHACO AND INTRAOCULAR LENS PLACEMENT ([place]);  Surgeon: [name], MD;  Location: [facility];  Service: Ophthalmology;  Laterality: Right;  CDE 6.74   CATARACT EXTRACTION W/PHACO Left 06/17/2014   Procedure: CATARACT EXTRACTION PHACO AND INTRAOCULAR LENS PLACEMENT ([facility]);  Surgeon: [name], MD;  Location: [facility];  Service: Ophthalmology;  Laterality: Left;  CDE:4.65   COLONOSCOPY WITH ESOPHAGOGASTRODUODENOSCOPY (EGD) N/A 01/22/2013   Procedure: COLONOSCOPY WITH ESOPHAGOGASTRODUODENOSCOPY (EGD);  Surgeon: [name], MD;  [facility];  Service: Endoscopy;  Laterality: N/A;  140   COLONOSCOPY WITH PROPOFOL N/A 05/30/2018   diverticulosis and external hemorrhoids   CORONARY ARTERY BYPASS GRAFT  2020   CYSTOSCOPY WITH INSERTION OF UROLIFT N/A 04/16/2022   Procedure: CYSTOSCOPY WITH INSERTION OF UROLIFT;  Surgeon: Cleon Gustin, MD;  [facility]: [facility];  Service: Urology;  Laterality: N/A;   ESOPHAGOGASTRODUODENOSCOPY (EGD) WITH PROPOFOL N/A 05/30/2018   normal esophagus, presence of possible barretts (bx consistent with reflux changes, GAVE)   ESOPHAGOGASTRODUODENOSCOPY (EGD) WITH PROPOFOL N/A 01/09/2022   Procedure: ESOPHAGOGASTRODUODENOSCOPY (EGD) WITH PROPOFOL;  Surgeon: Harvel Quale, MD;  [facility];  Service: Gastroenterology;  Laterality: N/A;  305, moved up per Leigh Amm   EYE SURGERY Bilateral    "cleaned film w/laser; since my 1st cataract OR"   JOINT REPLACEMENT     LEFT HEART CATH AND CORONARY ANGIOGRAPHY N/A 02/16/2019   Procedure: LEFT HEART CATH AND CORONARY ANGIOGRAPHY;  Surgeon: Belva Crome, MD;  Location: [place] [facility];  Service:  Cardiovascular;  Laterality: N/A;   POSTERIOR LUMBAR FUSION     "put a plate in"   QUADRICEPS TENDON REPAIR Left 07/01/2017   QUADRICEPS TENDON REPAIR Left 07/01/2017   Procedure: REPAIR QUADRICEP TENDON;  Surgeon: [name], MD;  Location: [place];  Service: Orthopedics;  Laterality: Left;   SHOULDER ARTHROSCOPY Bilateral    SPINAL CORD STIMULATOR INSERTION N/A 09/23/2015   Procedure: LUMBAR SPINAL CORD STIMULATOR INSERTION;  Surgeon: [name], MD;  Location: [place] [facility];  Service: Neurosurgery;  Laterality: N/A;  LUMBAR SPINAL CORD STIMULATOR INSERTION   SPINAL CORD STIMULATOR INSERTION N/A 04/25/2022   Procedure: REVISION OF  SPINAL CORD STIMULATOR;  Surgeon: Newman Pies, MD;  Location: [place];  Service: Neurosurgery;  Laterality: N/A;   TOTAL KNEE ARTHROPLASTY Left 06/17/2017   TOTAL KNEE ARTHROPLASTY Left 06/17/2017   Procedure: TOTAL KNEE ARTHROPLASTY;  Surgeon: [name], MD;  Location: [place];  Service: Orthopedics;  Laterality: Left;   TOTAL KNEE ARTHROPLASTY Right 12/16/2017   Procedure: TOTAL KNEE ARTHROPLASTY;  Surgeon: [name], MD;  Location: [place];  Service: Orthopedics;  Laterality: Right;    Home Medications:  Allergies as of 08/31/2022       Reactions   Tylenol [acetaminophen] Other (See Comments)   Stomach pain    Morphine And Related Nausea And Vomiting   Propofol Nausea And Vomiting        Medication List        Accurate as of August 31, 2022  8:41 AM. If you have any questions, ask your nurse or doctor.          ALPRAZolam 1 MG tablet Commonly known as: XANAX Take 1 mg by mouth at bedtime.   amiodarone 200 MG tablet Commonly known as: PACERONE TAKE 1 TABLET BY MOUTH EVERY DAY   amoxicillin 500 MG capsule Commonly known as: AMOXIL Take 2 capsules (1,000 mg total) by mouth every 8 (eight) hours.   apixaban 5 MG Tabs tablet Commonly known as: ELIQUIS Take 1 tablet (5 mg total) by mouth 2 (two) times daily. *Restart  04/28/2022* What changed: additional instructions   aspirin EC 81 MG tablet Take 1 tablet (81 mg total) by mouth daily.   atorvastatin 80 MG tablet Commonly known as: LIPITOR Take 1 tablet (80 mg total) by mouth daily.   B-12 PO Take 1 tablet by mouth daily.   docusate sodium 250 MG capsule Commonly known as: COLACE Take 250 mg by mouth daily.   FIBER-CAPS PO Take 2 capsules by mouth daily.   finasteride 5 MG tablet Commonly known as: PROSCAR TAKE 1 TABLET (5 MG TOTAL) BY MOUTH DAILY.   furosemide 20 MG tablet Commonly known as: LASIX Take 1 tablet (20 mg total) by mouth daily.   gabapentin 400 MG capsule  Commonly known as: NEURONTIN One three times a day What changed:  how much to take how to take this when to take this additional instructions   lubiprostone 24 MCG capsule Commonly known as: Amitiza Take 1 capsule (24 mcg total) by mouth 2 (two) times daily with a meal.   nitroGLYCERIN 0.4 MG SL tablet Commonly known as: NITROSTAT Place 1 tablet (0.4 mg total) under the tongue every 5 (five) minutes as needed for chest pain.   Omega-3 1000 MG Caps Take 1,000 mg by mouth daily.   pantoprazole 40 MG tablet Commonly known as: PROTONIX TAKE 1 TABLET BY MOUTH TWICE A DAY (30 TO 60 MINUTES BEFORE 1ST AND LAST MEALS OF THE DAY) What changed:  how much to take when to take this additional instructions   potassium chloride 10 MEQ tablet Commonly known as: KLOR-CON M Take 10 mEq by mouth daily.   silodosin 8 MG Caps capsule Commonly known as: RAPAFLO Take 1 capsule (8 mg total) by mouth at bedtime.   Systane Balance 0.6 % Soln Generic drug: Propylene Glycol Place 1 drop into both eyes daily as needed (dry eyes).   traMADol 50 MG tablet Commonly known as: ULTRAM Take 1-2 tablets (50-100 mg total) by mouth every 4 (four) hours as needed.   Vitamin D3 10 MCG (400 UNIT) Caps Take 400 Units by mouth daily.   vitamin E 200 UNIT capsule Take 200 Units by  mouth daily.   ZINC 15 PO Take 15 mg by mouth daily.        Allergies:  Allergies  Allergen Reactions   Tylenol [Acetaminophen] Other (See Comments)    Stomach pain    Morphine And Related Nausea And Vomiting   Propofol Nausea And Vomiting    Family History: No family history on file.  Social History:  reports that he quit smoking about 35 years ago. His smoking use included cigarettes. He has a 30.00 pack-year smoking history. He has quit using smokeless tobacco.  His smokeless tobacco use included chew. He reports current alcohol use. He reports that he does not use drugs.  ROS: All other review of systems were reviewed and are negative except what is noted above in HPI  Physical Exam: BP (!) 147/74   Pulse (!) 54   Constitutional:  Alert and oriented, No acute distress. HEENT: [place] AT, moist mucus membranes.  Trachea midline, no masses. Cardiovascular: No clubbing, cyanosis, or edema. Respiratory: Normal respiratory effort, no increased work of breathing. GI: Abdomen is soft, nontender, nondistended, no abdominal masses GU: No CVA tenderness.  Lymph: No cervical or inguinal lymphadenopathy. Skin: No rashes, bruises or suspicious lesions. Neurologic: Grossly intact, no focal deficits, moving all 4 extremities. Psychiatric: Normal mood and affect.  Laboratory Data: Lab Results  Component Value Date   WBC 5.7 08/23/2022   HGB 10.3 (L) 08/23/2022   HCT 31.1 (L) 08/23/2022   MCV 89.9 08/23/2022   PLT 157 08/23/2022    Lab Results  Component Value Date   CREATININE 0.72 08/24/2022    No results found for: "PSA"  No results found for: "TESTOSTERONE"  Lab Results  Component Value Date   HGBA1C 5.6 12/31/2020    Urinalysis    Component Value Date/Time   COLORURINE YELLOW 08/19/2022 1356   APPEARANCEUR HAZY (A) 08/19/2022 1356   APPEARANCEUR Cloudy (A) 08/01/2022 1133   LABSPEC 1.014 08/19/2022 1356   PHURINE 5.0 08/19/2022 1356   GLUCOSEU NEGATIVE  08/19/2022 1356   HGBUR LARGE (A) 08/19/2022  [place] 08/19/2022 1356   BILIRUBINUR Negative 08/01/2022 [place] 08/19/2022 1356   PROTEINUR NEGATIVE 08/19/2022 1356   UROBILINOGEN 0.2 03/07/2014 2103   NITRITE NEGATIVE 08/19/2022 1356   LEUKOCYTESUR SMALL (A) 08/19/2022 1356    Lab Results  Component Value Date   LABMICR See below: 08/01/2022   WBCUA >30 (A) 08/01/2022   LABEPIT 0-10 08/01/2022   MUCUS Present 06/27/2022   BACTERIA RARE (A) 08/19/2022    Pertinent Imaging: *** No results found for this or any previous visit.  No results found for this or any previous visit.  No results found for this or any previous visit.  No results found for this or any previous visit.  No results found for this or any previous visit.  No results found for this or any previous visit.  No results found for this or any previous visit.  Results for orders placed during the hospital encounter of 02/21/17  CT RENAL STONE STUDY  Narrative CLINICAL DATA:  Right flank pain for 5 days, history of appendectomy  EXAM: CT ABDOMEN AND PELVIS WITHOUT CONTRAST  TECHNIQUE: Multidetector CT imaging of the abdomen and pelvis was performed following the standard protocol without IV contrast.  COMPARISON:  None.  FINDINGS: Lower chest: Lung bases shows no acute findings. Partially visualized lower thoracic spinal canal stimulator wires  Hepatobiliary: Unenhanced liver shows no biliary ductal dilatation. Question tiny noncalcified gallstones or sludge within dependent gallbladder the largest measures 3 mm.  Pancreas: Unenhanced pancreas with normal appearance.  Spleen: Unenhanced spleen with normal appearance.  Adrenals/Urinary Tract: No adrenal gland mass. Unenhanced kidneys are symmetrical in size. No hydronephrosis or hydroureter. No nephrolithiasis. No calcified ureteral calculi. No calcified calculi are noted within urinary bladder. Mild  thickening of urinary bladder wall. Mild cystitis or chronic inflammation cannot be excluded.  Stomach/Bowel: There is no small bowel obstruction. The study is limited without oral or IV contrast. There is some gas in distal small bowel loops without thickening of small bowel wall. Minimal ileus cannot be excluded. There is no small bowel obstruction. No transition point in caliber of small bowel. No pericecal inflammation. The terminal ileum is unremarkable. The patient is status post appendectomy. Some colonic stool and gas noted within right colon. There is some colonic stool and gas within descending colon and sigmoid colon. No distal colonic obstruction. No definite evidence of colitis or diverticulitis.  Vascular/Lymphatic: Atherosclerotic calcifications of abdominal aorta and iliac arteries. No adenopathy is noted. No aortic aneurysm.  Reproductive: Mild enlarged prostate gland with indentation of urinary bladder base. Prostate gland measures 3.8 by 4.3 cm.  Other: There is a left inguinal scrotal canal hernia containing fat measures 1.6 cm without evidence of acute complication. Small nonspecific bilateral inguinal lymph nodes are noted. The largest left inguinal lymph node measures 6 mm short-axis. The largest right inguinal lymph node measures 7.5 mm short-axis. These are not pathologic by size criteria.  Musculoskeletal: No destructive bony lesions are noted. Sagittal images of the spine shows degenerative changes thoracolumbar spine. Posterior fusion noted lower lumbar spine at L4-L5 level with anatomic alignment.  IMPRESSION: 1. No nephrolithiasis. No hydronephrosis or hydroureter. Question tiny layering sludge or noncalcified gallstones within gallbladder the largest measures 3 mm. 2. No calcified calculi are noted within urinary bladder. Mild thickening of urinary bladder wall. Mild cystitis or chronic inflammation cannot be excluded. Clinical correlation is  necessary. 3. Some gas noted within distal small bowel loops without significant small  bowel distension. No transition point in caliber of small bowel. Mild ileus cannot be excluded. No definite evidence of small bowel obstruction. 4. No pericecal inflammation. The patient is status post appendectomy. Unremarkable terminal ileum. Moderate stool noted in cecum and proximal right colon. 5. No calcified ureteral calculi are noted. 6. Mild enlarged prostate gland with indentation of the bladder base. 7. Small nonspecific bilateral inguinal lymph nodes are noted. There is a small left inguinal scrotal canal hernia containing fat measures 1.6 cm no evidence of acute complication.   Electronically Signed By: Lahoma Crocker M.D. On: 02/22/2017 09:02   Assessment & Plan:    1. Urinary retention -RTC 6 weeks with PVR - BLADDER SCAN AMB NON-IMAGING - Urinalysis, Routine w reflex microscopic  2. Acute cystitis without hematuria - Urine Culture   No follow-ups on file.  Nicolette Bang, MD   [facility] [place]

## 2022-09-03 LAB — URINE CULTURE

## 2022-09-04 ENCOUNTER — Encounter: Payer: Self-pay | Admitting: Urology

## 2022-09-04 NOTE — Patient Instructions (Signed)
Acute Urinary Retention, Male  Acute urinary retention is when a person cannot pee (urinate) at all, or can only pee a little. This can come on all of a sudden. If it is not treated, it can lead to kidney problems or other serious problems. What are the causes? A problem with the tube that drains the bladder (urethra). Problems with the nerves in the bladder. Tumors. Certain medicines. An infection. Having trouble pooping (constipation). What increases the risk? Older men are more at risk because their prostate gland may become larger as they age. Other conditions also can increase risk. These include: Diseases, such as multiple sclerosis. Injury to the spinal cord. Diabetes. A condition that affects the way the brain works, such as dementia. Holding back urine due to trauma or because you do not want to use the bathroom. What are the signs or symptoms? Trouble peeing. Pain in the lower belly. How is this treated? Treatment for this condition may include: Medicines. Placing a thin, germ-free tube (catheter) into the bladder to drain pee out of the body. Therapy to treat mental health conditions. Treatment for conditions that may cause this. If needed, you may be treated in the hospital for kidney problems or to manage other problems. Follow these instructions at home: Medicines Take over-the-counter and prescription medicines only as told by your doctor. Ask your doctor what medicines you should stay away from. If you were given an antibiotic medicine, take it as told by your doctor. Do not stop taking it, even if you start to feel better. General instructions Do not smoke or use any products that contain nicotine or tobacco. If you need help quitting, ask your doctor. Drink enough fluid to keep your pee pale yellow. If you were sent home with a tube that drains the bladder, take care of it as told by your doctor. Watch for changes in your symptoms. Tell your doctor about them. If  told, keep track of changes in your blood pressure at home. Tell your doctor about them. Keep all follow-up visits. Contact a doctor if: You have spasms in your bladder that you cannot stop. You leak pee when you have spasms. Get help right away if: You have chills or a fever. You have blood in your pee. You have a tube that drains pee from the bladder and these things happen: The tube stops draining pee. The tube falls out. Summary Acute urinary retention is when you cannot pee at all or you pee too little. If this condition is not treated, it can lead to kidney problems or other serious problems. If you were sent home with a tube (catheter) that drains the bladder, take care of it as told by your doctor. Watch for changes in your symptoms. Tell your doctor about them. This information is not intended to replace advice given to you by your health care provider. Make sure you discuss any questions you have with your health care provider. Document Revised: 08/24/2020 Document Reviewed: 08/24/2020 Elsevier Patient Education  2023 Elsevier Inc.  

## 2022-09-11 DIAGNOSIS — R946 Abnormal results of thyroid function studies: Secondary | ICD-10-CM | POA: Diagnosis not present

## 2022-09-11 DIAGNOSIS — A419 Sepsis, unspecified organism: Secondary | ICD-10-CM | POA: Diagnosis not present

## 2022-09-11 DIAGNOSIS — I2510 Atherosclerotic heart disease of native coronary artery without angina pectoris: Secondary | ICD-10-CM | POA: Diagnosis not present

## 2022-09-11 DIAGNOSIS — G250 Essential tremor: Secondary | ICD-10-CM | POA: Diagnosis not present

## 2022-09-11 DIAGNOSIS — Z6823 Body mass index (BMI) 23.0-23.9, adult: Secondary | ICD-10-CM | POA: Diagnosis not present

## 2022-09-11 DIAGNOSIS — D649 Anemia, unspecified: Secondary | ICD-10-CM | POA: Diagnosis not present

## 2022-09-25 ENCOUNTER — Encounter: Payer: Self-pay | Admitting: Student

## 2022-09-25 ENCOUNTER — Ambulatory Visit: Payer: Medicare Other | Attending: Student | Admitting: Student

## 2022-09-25 VITALS — BP 116/62 | HR 56 | Ht 68.0 in | Wt 157.0 lb

## 2022-09-25 DIAGNOSIS — I480 Paroxysmal atrial fibrillation: Secondary | ICD-10-CM

## 2022-09-25 DIAGNOSIS — R600 Localized edema: Secondary | ICD-10-CM | POA: Diagnosis not present

## 2022-09-25 DIAGNOSIS — E785 Hyperlipidemia, unspecified: Secondary | ICD-10-CM

## 2022-09-25 DIAGNOSIS — I25810 Atherosclerosis of coronary artery bypass graft(s) without angina pectoris: Secondary | ICD-10-CM

## 2022-09-25 NOTE — Patient Instructions (Signed)
Medication Instructions:   Continue current medication regimen.   *If you need a refill on your cardiac medications before your next appointment, please call your pharmacy*    Follow-Up: At [facility], you and your health needs are our priority.  As part of our continuing mission to provide you with exceptional heart care, we have created designated Provider Care Teams.  These Care Teams include your primary Cardiologist (physician) and Advanced Practice Providers (APPs -  Physician Assistants and Nurse Practitioners) who all work together to provide you with the care you need, when you need it.  We recommend signing up for the patient portal called "MyChart".  Sign up information is provided on this After Visit Summary.  MyChart is used to connect with patients for Virtual Visits (Telemedicine).  Patients are able to view lab/test results, encounter notes, upcoming appointments, etc.  Non-urgent messages can be sent to your provider as well.   To learn more about what you can do with MyChart, go to [email or web address].    Your next appointment:   6 month(s)  The format for your next appointment:   In Person  Provider:   You may see Jenkins Rouge, MD or one of the following Advanced Practice Providers on your designated Care Team:   Bernerd Pho, PA-C  Ermalinda Barrios, [place]     Important Information About Sugar

## 2022-09-25 NOTE — Progress Notes (Signed)
Cardiology Office Note    Date:  09/25/2022   ID:  Christopher [name], Christopher [name] 04/04/1946, MRN [id number]  PCP:  [name], MD  Cardiologist: Jenkins Rouge, MD   EP: Dr. Lovena Le  Chief Complaint  Patient presents with   Hospitalization Follow-up    History of Present Illness:    Christopher [name] is a 76 y.o. male with past medical history of CAD (s/p CABG in 09/2019 with SVG-PDA-OM and LIMA-LAD with LAA excision), paroxysmal atrial fibrillation, OSA (intolerant to CPAP), HTN and HLD who presents to the office today for hospital follow-up.   He was most recently admitted to [facility] in 08/2022 for severe sepsis in the setting of Enterococcus bacteremia. Was also found to have acute cholecystitis during admission. Cardiology was consulted for preoperative cardiac clearance in regards to possible cholecystectomy and he was cleared to proceed. Was noted to have intermittent atrial fibrillation during admission and had been on Amiodarone prior to admission, therefore this was restarted. General Surgery followed the patient during admission and HIDA scan showed no evidence of acute cholecystitis and he did not require cholecystectomy.  In talking with the patient today, he reports overall feeling well since his recent hospitalization. He denies any recurrent abdominal pain. He does have acid reflux and is aware of his usual food triggers. He sometimes experiences epigastric pain at night but denies any exertional chest pain.  Has baseline dyspnea on exertion when walking up inclines which has been stable for the past few years. No symptoms with flat surfaces. He denies any specific orthopnea, PND or pitting edema.   Past Medical History:  Diagnosis Date   A-fib [facility])    Arthritis    "all over my body" (06/17/2017)   BPH (benign prostatic hyperplasia)    takes Proscar daily   CAD (coronary artery disease)    a. s/p CABG in 09/2019 with SVG-PDA-OM and LIMA-LAD with LAA excision   Chronic back pain     DDD; "upper and lower" (06/17/2017)   Constipation    takes Colace daily   Coronary artery disease    DDD (degenerative disc disease), cervical    DVT (deep venous thrombosis) (HCC)    left peroneal vein 07/2017 (in setting of recent left TKA)   Dysrhythmia    history of Atrial fibrillation, no long has this   Enlarged prostate    GERD (gastroesophageal reflux disease)    takes Nexium daily   H/O hiatal hernia    History of colon polyps    benign   History of gastric ulcer    Insomnia    takes Xanax nightly   Joint capsule tear    left knee   Joint pain    Joint swelling    Macular degeneration    Muscle spasm of back    takes Valium daily as needed   PAF (paroxysmal atrial fibrillation) (HCC)    Peripheral neuropathy    takes Gabapentin daily   PONV (postoperative nausea and vomiting)    Pre-diabetes    Sleep apnea    pt does not wear a cpap   Urinary hesitancy    Weakness    numbness and tingling    Past Surgical History:  Procedure Laterality Date   ANTERIOR CERVICAL DECOMP/DISCECTOMY FUSION  ~ 1991   "took bone from hip"   ANTERIOR CERVICAL DECOMP/DISCECTOMY FUSION  X 2   "put plate in"   ANTERIOR CERVICAL DISCECTOMY  <11/2005    5-6 and 4-5./notes 05/02/2011  APPENDECTOMY     BACK SURGERY     BIOPSY  05/30/2018   Procedure: BIOPSY;  Surgeon: [name], MD;  [facility];  Service: Endoscopy;;  esophagus   BIOPSY  01/09/2022   Procedure: BIOPSY;  Surgeon: Harvel Quale, MD;  [facility];  Service: Gastroenterology;;   CARDIAC CATHETERIZATION     3-4 per pt   CARPAL TUNNEL RELEASE Bilateral 10/2005 - 11/2005   right-left /notes 05/02/2011   CATARACT EXTRACTION W/PHACO Right 05/24/2014   Procedure: CATARACT EXTRACTION PHACO AND INTRAOCULAR LENS PLACEMENT ([place]);  Surgeon: [name], MD;  Location: [facility];  Service: Ophthalmology;  Laterality: Right;  CDE 6.74   CATARACT EXTRACTION W/PHACO Left 06/17/2014   Procedure:  CATARACT EXTRACTION PHACO AND INTRAOCULAR LENS PLACEMENT (IOC);  Surgeon: [name], MD;  Location: [facility];  Service: Ophthalmology;  Laterality: Left;  CDE:4.65   COLONOSCOPY WITH ESOPHAGOGASTRODUODENOSCOPY (EGD) N/A 01/22/2013   Procedure: COLONOSCOPY WITH ESOPHAGOGASTRODUODENOSCOPY (EGD);  Surgeon: [name], MD;  [facility];  Service: Endoscopy;  Laterality: N/A;  140   COLONOSCOPY WITH PROPOFOL N/A 05/30/2018   diverticulosis and external hemorrhoids   CORONARY ARTERY BYPASS GRAFT  2020   CYSTOSCOPY WITH INSERTION OF UROLIFT N/A 04/16/2022   Procedure: CYSTOSCOPY WITH INSERTION OF UROLIFT;  Surgeon: Cleon Gustin, MD;  Location: [facility];  Service: Urology;  Laterality: N/A;   ESOPHAGOGASTRODUODENOSCOPY (EGD) WITH PROPOFOL N/A 05/30/2018   normal esophagus, presence of possible barretts (bx consistent with reflux changes, GAVE)   ESOPHAGOGASTRODUODENOSCOPY (EGD) WITH PROPOFOL N/A 01/09/2022   Procedure: ESOPHAGOGASTRODUODENOSCOPY (EGD) WITH PROPOFOL;  Surgeon: Harvel Quale, MD;  [facility];  Service: Gastroenterology;  Laterality: N/A;  305, moved up per Leigh Amm   EYE SURGERY Bilateral    "cleaned film w/laser; since my 1st cataract OR"   JOINT REPLACEMENT     LEFT HEART CATH AND CORONARY ANGIOGRAPHY N/A 02/16/2019   Procedure: LEFT HEART CATH AND CORONARY ANGIOGRAPHY;  Surgeon: Belva Crome, MD;  Location: [place] [facility];  Service: Cardiovascular;  Laterality: N/A;   POSTERIOR LUMBAR FUSION     "put a plate in"   QUADRICEPS TENDON REPAIR Left 07/01/2017   QUADRICEPS TENDON REPAIR Left 07/01/2017   Procedure: REPAIR QUADRICEP TENDON;  Surgeon: [name], MD;  Location: [place];  Service: Orthopedics;  Laterality: Left;   SHOULDER ARTHROSCOPY Bilateral    SPINAL CORD STIMULATOR INSERTION N/A 09/23/2015   Procedure: LUMBAR SPINAL CORD STIMULATOR INSERTION;  Surgeon: [name], MD;  Location: [place] [facility];  Service: Neurosurgery;   Laterality: N/A;  LUMBAR SPINAL CORD STIMULATOR INSERTION   SPINAL CORD STIMULATOR INSERTION N/A 04/25/2022   Procedure: REVISION OF  SPINAL CORD STIMULATOR;  Surgeon: Newman Pies, MD;  Location: [place];  Service: Neurosurgery;  Laterality: N/A;   TOTAL KNEE ARTHROPLASTY Left 06/17/2017   TOTAL KNEE ARTHROPLASTY Left 06/17/2017   Procedure: TOTAL KNEE ARTHROPLASTY;  Surgeon: [name], MD;  Location: [place];  Service: Orthopedics;  Laterality: Left;   TOTAL KNEE ARTHROPLASTY Right 12/16/2017   Procedure: TOTAL KNEE ARTHROPLASTY;  Surgeon: [name], MD;  Location: [place];  Service: Orthopedics;  Laterality: Right;    Current Medications: Outpatient Medications Prior to Visit  Medication Sig Dispense Refill   ALPRAZolam (XANAX) 1 MG tablet Take 1 mg by mouth at bedtime.  0   amiodarone (PACERONE) 200 MG tablet TAKE 1 TABLET BY MOUTH EVERY DAY (Patient taking differently: Take 200  mg by mouth daily.) 90 tablet 3   amoxicillin (AMOXIL) 500 MG capsule Take 2 capsules (1,000 mg total) by mouth every 8 (eight) hours. 60 capsule 0   apixaban (ELIQUIS) 5 MG TABS tablet Take 1 tablet (5 mg total) by mouth 2 (two) times daily. *Restart 04/28/2022* (Patient taking differently: Take 5 mg by mouth 2 (two) times daily.) 60 tablet    aspirin EC 81 MG tablet Take 1 tablet (81 mg total) by mouth daily. 90 tablet 3   atorvastatin (LIPITOR) 80 MG tablet Take 1 tablet (80 mg total) by mouth daily. 90 tablet 3   Calcium Polycarbophil (FIBER-CAPS PO) Take 2 capsules by mouth daily.     Cholecalciferol (VITAMIN D3) 10 MCG (400 UNIT) CAPS Take 400 Units by mouth daily.     Cyanocobalamin (B-12 PO) Take 1 tablet by mouth daily.     docusate sodium (COLACE) 250 MG capsule Take 250 mg by mouth daily.     finasteride (PROSCAR) 5 MG tablet TAKE 1 TABLET (5 MG TOTAL) BY MOUTH DAILY. 90 tablet 3   furosemide (LASIX) 20 MG tablet Take 1 tablet (20 mg total) by mouth daily. 90 tablet 3   gabapentin (NEURONTIN) 400 MG  capsule One three times a day (Patient taking differently: Take 400 mg by mouth 3 (three) times daily.)     lubiprostone (AMITIZA) 24 MCG capsule Take 1 capsule (24 mcg total) by mouth 2 (two) times daily with a meal. 180 capsule 3   nitroGLYCERIN (NITROSTAT) 0.4 MG SL tablet Place 1 tablet (0.4 mg total) under the tongue every 5 (five) minutes as needed for chest pain. 25 tablet 5   Omega-3 1000 MG CAPS Take 1,000 mg by mouth daily.     pantoprazole (PROTONIX) 40 MG tablet TAKE 1 TABLET BY MOUTH TWICE A DAY (30 TO 60 MINUTES BEFORE 1ST AND LAST MEALS OF THE DAY) (Patient taking differently: 40 mg 2 (two) times daily. (30 TO 60 MINUTES BEFORE 1ST AND LAST MEALS OF THE DAY)) 180 tablet 0   potassium chloride (KLOR-CON) 10 MEQ tablet Take 10 mEq by mouth daily.     Propylene Glycol (SYSTANE BALANCE) 0.6 % SOLN Place 1 drop into both eyes daily as needed (dry eyes).     silodosin (RAPAFLO) 8 MG CAPS capsule Take 1 capsule (8 mg total) by mouth at bedtime. 30 capsule 11   traMADol (ULTRAM) 50 MG tablet Take 1-2 tablets (50-100 mg total) by mouth every 4 (four) hours as needed. 48 tablet 0   vitamin E 200 UNIT capsule Take 200 Units by mouth daily.     Zinc Sulfate (ZINC 15 PO) Take 15 mg by mouth daily.     No facility-administered medications prior to visit.     Allergies:   Tylenol [acetaminophen], Morphine and related, and Propofol   Social History   Socioeconomic History   Marital status: Divorced    Spouse name: Not on file   Number of children: 3   Years of education: Not on file   Highest education level: Not on file  Occupational History   Not on file  Tobacco Use   Smoking status: Former    Packs/day: 1.00    Years: 30.00    Total pack years: 30.00    Types: Cigarettes    Quit date: 01/14/1987    Years since quitting: 35.7   Smokeless tobacco: Former    Types: Chew   Tobacco comments:    quit smoking  & chewing  by 1987  Vaping Use   Vaping Use: Never used  Substance and  Sexual Activity   Alcohol use: Yes    Comment: 2-3 beers per night   Drug use: No   Sexual activity: Not Currently  Other Topics Concern   Not on file  Social History Narrative   Not on file   Social Determinants of Health   Financial Resource Strain: Not on file  Food Insecurity: Not on file  Transportation Needs: Not on file  Physical Activity: Not on file  Stress: Not on file  Social Connections: Not on file     Family History:  The patient's family history is not on file.   Review of Systems:    Please see the history of present illness.     All other systems reviewed and are otherwise negative except as noted above.   Physical Exam:    VS:  BP 116/62   Pulse (!) 56   Ht '5\' 8"'$  (1.727 m)   Wt 157 lb (71.2 kg)   SpO2 95%   BMI 23.87 kg/m    General: Well developed, well nourished,male appearing in no acute distress. Head: Normocephalic, atraumatic. Neck: No carotid bruits. JVD not elevated.  Lungs: Respirations regular and unlabored, without wheezes or rales.  Heart: Regular rate and rhythm. No S3 or S4.  No murmur, no rubs, or gallops appreciated. Abdomen: Appears non-distended. No obvious abdominal masses. Msk:  Strength and tone appear normal for age. No obvious joint deformities or effusions. Extremities: No clubbing or cyanosis. No pitting edema.  Distal pedal pulses are 2+ bilaterally. Neuro: Alert and oriented X 3. Moves all extremities spontaneously. No focal deficits noted. Psych:  Responds to questions appropriately with a normal affect. Skin: No rashes or lesions noted  Wt Readings from Last 3 Encounters:  09/25/22 157 lb (71.2 kg)  08/22/22 174 lb 2.6 oz (79 kg)  05/25/22 170 lb (77.1 kg)     Studies/Labs Reviewed:   EKG:  EKG is not ordered today.    Recent Labs: 01/09/2022: TSH 2.010 08/23/2022: Hemoglobin 10.3; Magnesium 1.8; Platelets 157 08/24/2022: ALT 45; BUN <5; Creatinine, Ser 0.72; Potassium 3.7; Sodium 139   Lipid Panel     Component Value Date/Time   CHOL 120 12/31/2020 2351   TRIG 45 12/31/2020 2351   HDL 53 12/31/2020 2351   CHOLHDL 2.3 12/31/2020 2351   VLDL 9 12/31/2020 2351   LDLCALC 58 12/31/2020 2351    Additional studies/ records that were reviewed today include:   Echocardiogram: 08/20/2022 IMPRESSIONS     1. Left ventricular ejection fraction, by estimation, is 55 to 60%. The  left ventricle has normal function. The left ventricle has no regional  wall motion abnormalities. Left ventricular diastolic parameters are  consistent with Grade II diastolic  dysfunction (pseudonormalization). Elevated left atrial pressure.   2. Right ventricular systolic function is normal. The right ventricular  size is mildly enlarged. There is mildly elevated pulmonary artery  systolic pressure.   3. Left atrial size was moderately dilated.   4. Right atrial size was moderately dilated.   5. The mitral valve is normal in structure. No evidence of mitral valve  regurgitation. No evidence of mitral stenosis.   6. The aortic valve has an indeterminant number of cusps. Aortic valve  regurgitation is not visualized. No aortic stenosis is present.   7. The inferior vena cava is dilated in size with <50% respiratory  variability, suggesting right atrial pressure of 15 mmHg.  Assessment:    1. Coronary artery disease involving coronary bypass graft of native heart without angina pectoris   2. Paroxysmal atrial fibrillation (HCC)   3. Hyperlipidemia LDL goal <70   4. Bilateral lower extremity edema      Plan:   In order of problems listed above:  1. CAD - He is s/p CABG in 09/2019 with SVG-PDA-OM and LIMA-LAD with LAA excision. His recent chest discomfort overall seems atypical for angina as it occurs at night and is not associated with exertion. He does have baseline dyspnea on exertion when walking up inclines but this has been stable. I encouraged him to make [place] aware if he experiences any progression  of symptoms as we could arrange for a Lexiscan Myoview for ischemic evaluation. - Continue current medical therapy for now with ASA 81 mg daily and Atorvastatin 80 mg daily. He has not been on beta-blocker therapy due to baseline bradycardia.    2. Paroxysmal Atrial Fibrillation - He denies any recent palpitations and is in normal sinus rhythm by examination today. He remains on Amiodarone 200 mg daily. PFT's earlier this year showed possible emphysema but no ILD.  - No recurrent hematuria. He has resumed taking Eliquis 5 mg twice daily and denies any evidence of active bleeding.    3. HLD - Followed by his PCP. His LDL was at 58 in 2022. Remains on Atorvastatin '80mg'$  daily.   4. Lower Extremity Edema - His symptoms have been well-controlled with Lasix '20mg'$  daily. His creatinine was stable at 0.72 by most recent labs in 08/2022.   Medication Adjustments/Labs and Tests Ordered: Current medicines are reviewed at length with the patient today.  Concerns regarding medicines are outlined above.  Medication changes, Labs and Tests ordered today are listed in the Patient Instructions below. Patient Instructions  Medication Instructions:   Continue current medication regimen.   *If you need a refill on your cardiac medications before your next appointment, please call your pharmacy*    Follow-Up: At [facility], you and your health needs are our priority.  As part of our continuing mission to provide you with exceptional heart care, we have created designated Provider Care Teams.  These Care Teams include your primary Cardiologist (physician) and Advanced Practice Providers (APPs -  Physician Assistants and Nurse Practitioners) who all work together to provide you with the care you need, when you need it.  We recommend signing up for the patient portal called "MyChart".  Sign up information is provided on this After Visit Summary.  MyChart is used to connect with patients for Virtual  Visits (Telemedicine).  Patients are able to view lab/test results, encounter notes, upcoming appointments, etc.  Non-urgent messages can be sent to your provider as well.   To learn more about what you can do with MyChart, go to [email or web address].    Your next appointment:   6 month(s)  The format for your next appointment:   In Person  Provider:   You may see Jenkins Rouge, MD or one of the following Advanced Practice Providers on your designated Care Team:   Bernerd Pho, PA-C  Ermalinda Barrios, PA-C     Important Information About Sugar         Signed, Erma Heritage, PA-C  09/25/2022 8:40 PM    [place]. [address] [place],  [address] Phone: [phone] Fax: [phone]

## 2022-09-26 DIAGNOSIS — W19XXXA Unspecified fall, initial encounter: Secondary | ICD-10-CM | POA: Diagnosis not present

## 2022-09-26 DIAGNOSIS — G250 Essential tremor: Secondary | ICD-10-CM | POA: Diagnosis not present

## 2022-09-26 DIAGNOSIS — R269 Unspecified abnormalities of gait and mobility: Secondary | ICD-10-CM | POA: Diagnosis not present

## 2022-10-03 ENCOUNTER — Other Ambulatory Visit: Payer: Self-pay | Admitting: *Deleted

## 2022-10-03 NOTE — Patient Outreach (Signed)
  Care Coordination   10/03/2022  Name: Christopher [name] MRN: [id number] DOB: 1946-07-23   Care Coordination Outreach Attempts:  An unsuccessful telephone outreach was attempted today to offer the patient information about available care coordination services as a benefit of their health plan. HIPAA compliant message left on voicemail, providing contact information for CSW, encouraging patient to return CSW's call at his earliest convenience.  Follow Up Plan:  Additional outreach attempts will be made to offer the patient care coordination information and services.   Encounter Outcome:  No Answer.   Care Coordination Interventions Activated:  No.    Care Coordination Interventions:  No, not indicated.    [name], BSW, MSW, LCSW  Licensed Education officer, [facility]  Mailing [place] [address], [place], [place] [address] Physical Address[address] [place], [place] [address] Toll Free Main # [phone] Fax # [phone] Cell # [phone] [email or web address]

## 2022-10-04 DIAGNOSIS — C44319 Basal cell carcinoma of skin of other parts of face: Secondary | ICD-10-CM | POA: Diagnosis not present

## 2022-10-10 ENCOUNTER — Other Ambulatory Visit: Payer: Self-pay | Admitting: *Deleted

## 2022-10-10 NOTE — Patient Outreach (Signed)
  Care Coordination   10/10/2022  Name: Christopher [name] MRN: [id number] DOB: 1946-04-12   Care Coordination Outreach Attempts:  A second unsuccessful outreach was attempted today to offer the patient with information about available care coordination services as a benefit of their health plan.   HIPAA compliant message left on voicemail, providing contact information for CSW, encouraging patient to return CSW's call at his earliest convenience.  Follow Up Plan:  Additional outreach attempts will be made to offer the patient care coordination information and services.   Encounter Outcome:  No Answer.   Care Coordination Interventions Activated:  No.    Care Coordination Interventions:  No, not indicated.    [name], BSW, MSW, LCSW  Licensed Education officer, environmental [facility]  Mailing [place] [address], [place], [place] [address] Physical Address[address] [place], [place] [address] Toll Free Main # [phone] Fax # [phone] Cell # [phone] [email or web address]

## 2022-10-16 ENCOUNTER — Other Ambulatory Visit: Payer: Self-pay | Admitting: *Deleted

## 2022-10-16 NOTE — Patient Outreach (Signed)
  Care Coordination   10/16/2022  Name: Christopher [name] MRN: [id number] DOB: 1945/12/22   Care Coordination Outreach Attempts:  A third unsuccessful outreach was attempted today to offer the patient with information about available care coordination services as a benefit of their health plan. HIPAA compliant message left on voicemail, providing contact information for CSW, encouraging patient to return CSW's call at his earliest convenience.  Follow Up Plan:  No further outreach attempts will be made at this time. We have been unable to contact the patient to offer or enroll patient in care coordination services.  Encounter Outcome:  No Answer.   Care Coordination Interventions Activated:  No.    Care Coordination Interventions:  No, not indicated.    [name], BSW, MSW, LCSW  Licensed Education officer, environmental [facility]  Mailing [place] [address], [place], [place] [address] Physical Address[address], [place], [place] [address] Toll Free Main # [phone] Fax # [phone] Cell # [phone] [email or web address]

## 2022-10-17 ENCOUNTER — Ambulatory Visit: Payer: Medicare Other | Admitting: Urology

## 2022-10-17 DIAGNOSIS — R339 Retention of urine, unspecified: Secondary | ICD-10-CM

## 2022-10-17 DIAGNOSIS — N3000 Acute cystitis without hematuria: Secondary | ICD-10-CM

## 2022-10-22 ENCOUNTER — Encounter: Payer: Self-pay | Admitting: "Endocrinology

## 2022-10-22 ENCOUNTER — Ambulatory Visit: Payer: Medicare Other | Admitting: "Endocrinology

## 2022-10-22 VITALS — BP 124/68 | HR 60 | Ht 68.0 in | Wt 158.8 lb

## 2022-10-22 DIAGNOSIS — E0590 Thyrotoxicosis, unspecified without thyrotoxic crisis or storm: Secondary | ICD-10-CM

## 2022-10-22 DIAGNOSIS — H9190 Unspecified hearing loss, unspecified ear: Secondary | ICD-10-CM

## 2022-10-22 NOTE — Progress Notes (Signed)
Endocrinology Consult Note                                            10/22/2022, 5:38 PM   Subjective:    Patient ID: Christopher [name], male    DOB: 1946/06/28, PCP [name], MD   Past Medical History:  Diagnosis Date   A-fib [facility])    Arthritis    "all over my body" (06/17/2017)   BPH (benign prostatic hyperplasia)    takes Proscar daily   CAD (coronary artery disease)    a. s/p CABG in 09/2019 with SVG-PDA-OM and LIMA-LAD with LAA excision   Chronic back pain    DDD; "upper and lower" (06/17/2017)   Constipation    takes Colace daily   Coronary artery disease    DDD (degenerative disc disease), cervical    DVT (deep venous thrombosis) ([place])    left peroneal vein 07/2017 (in setting of recent left TKA)   Dysrhythmia    history of Atrial fibrillation, no long has this   Enlarged prostate    GERD (gastroesophageal reflux disease)    takes Nexium daily   H/O hiatal hernia    History of colon polyps    benign   History of gastric ulcer    Insomnia    takes Xanax nightly   Joint capsule tear    left knee   Joint pain    Joint swelling    Macular degeneration    Muscle spasm of back    takes Valium daily as needed   PAF (paroxysmal atrial fibrillation) ([place])    Peripheral neuropathy    takes Gabapentin daily   PONV (postoperative nausea and vomiting)    Pre-diabetes    Sleep apnea    pt does not wear a cpap   Urinary hesitancy    Weakness    numbness and tingling   Past Surgical History:  Procedure Laterality Date   ANTERIOR CERVICAL DECOMP/DISCECTOMY FUSION  ~ 1991   "took bone from hip"   ANTERIOR CERVICAL DECOMP/DISCECTOMY FUSION  X 2   "put plate in"   ANTERIOR CERVICAL DISCECTOMY  <11/2005    5-6 and 4-5./notes 05/02/2011   APPENDECTOMY     BACK SURGERY     BIOPSY  05/30/2018   Procedure: BIOPSY;  Surgeon: [name], MD;  [facility];  Service: Endoscopy;;  esophagus   BIOPSY  01/09/2022   Procedure: BIOPSY;  Surgeon:  Harvel Quale, MD;  [facility];  Service: Gastroenterology;;   CARDIAC CATHETERIZATION     3-4 per pt   CARPAL TUNNEL RELEASE Bilateral 10/2005 - 11/2005   right-left /notes 05/02/2011   CATARACT EXTRACTION W/PHACO Right 05/24/2014   Procedure: CATARACT EXTRACTION PHACO AND INTRAOCULAR LENS PLACEMENT ([place]);  Surgeon: [name], MD;  Location: [facility];  Service: Ophthalmology;  Laterality: Right;  CDE 6.74   CATARACT EXTRACTION W/PHACO Left 06/17/2014   Procedure: CATARACT EXTRACTION PHACO AND INTRAOCULAR LENS PLACEMENT (IOC);  Surgeon: [name], MD;  Location: [facility];  Service: Ophthalmology;  Laterality: Left;  CDE:4.65   COLONOSCOPY WITH ESOPHAGOGASTRODUODENOSCOPY (EGD) N/A 01/22/2013   Procedure: COLONOSCOPY WITH ESOPHAGOGASTRODUODENOSCOPY (EGD);  Surgeon: [name], MD;  [facility];  Service: Endoscopy;  Laterality: N/A;  140   COLONOSCOPY WITH PROPOFOL N/A 05/30/2018   diverticulosis and  external hemorrhoids   CORONARY ARTERY BYPASS GRAFT  2020   CYSTOSCOPY WITH INSERTION OF UROLIFT N/A 04/16/2022   Procedure: CYSTOSCOPY WITH INSERTION OF UROLIFT;  Surgeon: Cleon Gustin, MD;  Location: [facility];  Service: Urology;  Laterality: N/A;   ESOPHAGOGASTRODUODENOSCOPY (EGD) WITH PROPOFOL N/A 05/30/2018   normal esophagus, presence of possible barretts (bx consistent with reflux changes, GAVE)   ESOPHAGOGASTRODUODENOSCOPY (EGD) WITH PROPOFOL N/A 01/09/2022   Procedure: ESOPHAGOGASTRODUODENOSCOPY (EGD) WITH PROPOFOL;  Surgeon: Harvel Quale, MD;  [facility];  Service: Gastroenterology;  Laterality: N/A;  305, moved up per Leigh Amm   EYE SURGERY Bilateral    "cleaned film w/laser; since my 1st cataract OR"   JOINT REPLACEMENT     LEFT HEART CATH AND CORONARY ANGIOGRAPHY N/A 02/16/2019   Procedure: LEFT HEART CATH AND CORONARY ANGIOGRAPHY;  Surgeon: Belva Crome, MD;  Location: [place] [facility];  Service: Cardiovascular;   Laterality: N/A;   POSTERIOR LUMBAR FUSION     "put a plate in"   QUADRICEPS TENDON REPAIR Left 07/01/2017   QUADRICEPS TENDON REPAIR Left 07/01/2017   Procedure: REPAIR QUADRICEP TENDON;  Surgeon: [name], MD;  Location: [place];  Service: Orthopedics;  Laterality: Left;   SHOULDER ARTHROSCOPY Bilateral    SPINAL CORD STIMULATOR INSERTION N/A 09/23/2015   Procedure: LUMBAR SPINAL CORD STIMULATOR INSERTION;  Surgeon: [name], MD;  Location: [place] [facility];  Service: Neurosurgery;  Laterality: N/A;  LUMBAR SPINAL CORD STIMULATOR INSERTION   SPINAL CORD STIMULATOR INSERTION N/A 04/25/2022   Procedure: REVISION OF  SPINAL CORD STIMULATOR;  Surgeon: Newman Pies, MD;  Location: [place];  Service: Neurosurgery;  Laterality: N/A;   TOTAL KNEE ARTHROPLASTY Left 06/17/2017   TOTAL KNEE ARTHROPLASTY Left 06/17/2017   Procedure: TOTAL KNEE ARTHROPLASTY;  Surgeon: [name], MD;  Location: [place];  Service: Orthopedics;  Laterality: Left;   TOTAL KNEE ARTHROPLASTY Right 12/16/2017   Procedure: TOTAL KNEE ARTHROPLASTY;  Surgeon: [name], MD;  Location: [place];  Service: Orthopedics;  Laterality: Right;   Social History   Socioeconomic History   Marital status: Divorced    Spouse name: Not on file   Number of children: 3   Years of education: Not on file   Highest education level: Not on file  Occupational History   Not on file  Tobacco Use   Smoking status: Former    Packs/day: 1.00    Years: 30.00    Total pack years: 30.00    Types: Cigarettes    Quit date: 01/14/1987    Years since quitting: 35.7   Smokeless tobacco: Former    Types: Chew   Tobacco comments:    quit smoking  & chewing by 1987  Vaping Use   Vaping Use: Never used  Substance and Sexual Activity   Alcohol use: Yes    Comment: 2-3 beers per night   Drug use: No   Sexual activity: Not Currently  Other Topics Concern   Not on file  Social History Narrative   Not on file   Social Determinants of Health    Financial Resource Strain: Not on file  Food Insecurity: Not on file  Transportation Needs: Not on file  Physical Activity: Not on file  Stress: Not on file  Social Connections: Not on file   History reviewed. No pertinent family history. Outpatient Encounter Medications as of 10/22/2022  Medication Sig   ascorbic acid (VITAMIN C) 500 MG tablet Take 500 mg by mouth  daily.   Simethicone 180 MG CAPS Take 1 capsule by mouth 3 (three) times daily.   alfuzosin (UROXATRAL) 10 MG 24 hr tablet Take 1 tablet by mouth daily with breakfast.   ALPRAZolam (XANAX) 1 MG tablet Take 1 mg by mouth at bedtime.   amiodarone (PACERONE) 200 MG tablet TAKE 1 TABLET BY MOUTH EVERY DAY (Patient taking differently: Take 200 mg by mouth daily.)   apixaban (ELIQUIS) 5 MG TABS tablet Take 1 tablet (5 mg total) by mouth 2 (two) times daily. *Restart 04/28/2022* (Patient taking differently: Take 5 mg by mouth 2 (two) times daily.)   aspirin EC 81 MG tablet Take 1 tablet (81 mg total) by mouth daily.   atorvastatin (LIPITOR) 80 MG tablet Take 1 tablet (80 mg total) by mouth daily.   Calcium Polycarbophil (FIBER-CAPS PO) Take 2 capsules by mouth daily.   Cholecalciferol (VITAMIN D3) 10 MCG (400 UNIT) CAPS Take 400 Units by mouth daily.   Cyanocobalamin (B-12 PO) Take 1 tablet by mouth daily.   docusate sodium (COLACE) 250 MG capsule Take 250 mg by mouth daily.   finasteride (PROSCAR) 5 MG tablet TAKE 1 TABLET (5 MG TOTAL) BY MOUTH DAILY.   furosemide (LASIX) 20 MG tablet Take 1 tablet (20 mg total) by mouth daily.   gabapentin (NEURONTIN) 400 MG capsule One three times a day (Patient taking differently: Take 400 mg by mouth 4 (four) times daily.)   isosorbide mononitrate (IMDUR) 60 MG 24 hr tablet Take 60 mg by mouth daily.   lubiprostone (AMITIZA) 24 MCG capsule Take 1 capsule (24 mcg total) by mouth 2 (two) times daily with a meal.   metoprolol tartrate (LOPRESSOR) 25 MG tablet Take 25 mg by mouth 2 (two) times  daily.   nitroGLYCERIN (NITROSTAT) 0.4 MG SL tablet Place 1 tablet (0.4 mg total) under the tongue every 5 (five) minutes as needed for chest pain.   Omega-3 1000 MG CAPS Take 1,000 mg by mouth daily.   pantoprazole (PROTONIX) 40 MG tablet TAKE 1 TABLET BY MOUTH TWICE A DAY (30 TO 60 MINUTES BEFORE 1ST AND LAST MEALS OF THE DAY) (Patient taking differently: 40 mg 2 (two) times daily. (30 TO 60 MINUTES BEFORE 1ST AND LAST MEALS OF THE DAY))   potassium chloride (KLOR-CON) 10 MEQ tablet Take 10 mEq by mouth daily.   silodosin (RAPAFLO) 8 MG CAPS capsule Take 1 capsule (8 mg total) by mouth at bedtime.   traMADol (ULTRAM) 50 MG tablet Take 1-2 tablets (50-100 mg total) by mouth every 4 (four) hours as needed.   vitamin E 200 UNIT capsule Take 200 Units by mouth daily.   Zinc Sulfate (ZINC 15 PO) Take 15 mg by mouth daily.   [DISCONTINUED] amoxicillin (AMOXIL) 500 MG capsule Take 2 capsules (1,000 mg total) by mouth every 8 (eight) hours.   [DISCONTINUED] apixaban (ELIQUIS) 5 MG TABS tablet Take 1 tablet by mouth 2 (two) times daily.   [DISCONTINUED] Propylene Glycol (SYSTANE BALANCE) 0.6 % SOLN Place 1 drop into both eyes daily as needed (dry eyes).   No facility-administered encounter medications on file as of 10/22/2022.   ALLERGIES: Allergies  Allergen Reactions   Tylenol [Acetaminophen] Other (See Comments)    Stomach pain    Morphine And Related Nausea And Vomiting   Propofol Nausea And Vomiting    VACCINATION STATUS: Immunization History  Administered Date(s) Administered   Fluad Quad(high Dose 65+) 12/19/2020   Influenza Inj Mdck Quad Pf 11/05/2018   Influenza, High  Dose Seasonal PF 10/09/2019   Influenza,inj,quad, With Preservative 12/20/2018   Pneumococcal Conjugate-13 10/09/2019   Tdap 07/22/2019    HPI ALONDRA SAHNI is 76 y.o. male who presents today with a medical history as above. he is being seen in consultation for hyperthyroidism requested by [name], MD.    Patient is not optimal historian, and hard of hearing. He reports weight loss of 18 pounds, worsening tremors over the last several months.  In June, he was found to have suppressed TSH and elevated total T4.  He does not think his thyroid is causing his weight loss.  He is not on any antithyroid intervention.  He is taking amiodarone 200 mg p.o. daily from his cardiologist.  He denies any family history of thyroid dysfunction or thyroid malignancy. He is not on polypharmacy. He has multiple medical problems including coronary artery disease, hypertension, hyperlipidemia. Review of Systems  Constitutional: no recent weight gain/loss, no fatigue, no subjective hyperthermia, no subjective hypothermia Eyes: no blurry vision, no xerophthalmia ENT: no sore throat, no nodules palpated in throat, no dysphagia/odynophagia, no hoarseness Cardiovascular: no Chest Pain, no Shortness of Breath, no palpitations, no leg swelling Respiratory: no cough, no shortness of breath Gastrointestinal: no Nausea/Vomiting/Diarhhea Musculoskeletal: no muscle/joint aches Skin: no rashes Neurological: no tremors, no numbness, no tingling, no dizziness Psychiatric: no depression, no anxiety  Objective:       10/22/2022    2:24 PM 09/25/2022    3:26 PM 08/31/2022    8:29 AM  Vitals with BMI  Height '5\' 8"'$  '5\' 8"'$    Weight 158 lbs 13 oz 157 lbs   BMI 43.32 95.18   Systolic 841 660 630  Diastolic 68 62 74  Pulse 60 56 54    BP 124/68   Pulse 60   Ht '5\' 8"'$  (1.727 m)   Wt 158 lb 12.8 oz (72 kg)   BMI 24.15 kg/m   Wt Readings from Last 3 Encounters:  10/22/22 158 lb 12.8 oz (72 kg)  09/25/22 157 lb (71.2 kg)  08/22/22 174 lb 2.6 oz (79 kg)    Physical Exam  Constitutional:  Body mass index is 24.15 kg/m.,  not in acute distress, normal state of mind Eyes: PERRLA, EOMI, no exophthalmos ENT: moist mucous membranes, no gross thyromegaly, no gross cervical lymphadenopathy Cardiovascular: normal  precordial activity, Regular Rate and Rhythm, no Murmur/Rubs/Gallops Respiratory:  adequate breathing efforts, no gross chest deformity, Clear to auscultation bilaterally Gastrointestinal: abdomen soft, Non -tender, No distension, Bowel Sounds present, no gross organomegaly Musculoskeletal: no gross deformities, strength intact in all four extremities Skin: moist, warm, no rashes Neurological: + tremor with outstretched hands, Deep tendon reflexes normal in bilateral lower extremities.  CMP ( most recent) CMP     Component Value Date/Time   NA 139 08/24/2022 0444   NA 145 (H) 02/11/2019 1255   K 3.7 08/24/2022 0444   CL 111 08/24/2022 0444   CO2 24 08/24/2022 0444   GLUCOSE 97 08/24/2022 0444   BUN <5 (L) 08/24/2022 0444   BUN 10 02/11/2019 1255   CREATININE 0.72 08/24/2022 0444   CALCIUM 8.4 (L) 08/24/2022 0444   PROT 5.2 (L) 08/24/2022 0444   ALBUMIN 2.4 (L) 08/24/2022 0444   AST 34 08/24/2022 0444   ALT 45 (H) 08/24/2022 0444   ALKPHOS 82 08/24/2022 0444   BILITOT 1.0 08/24/2022 0444   GFRNONAA >60 08/24/2022 0444   GFRAA 101 02/11/2019 1255     Diabetic Labs (most recent): Lab Results  Component Value Date   HGBA1C 5.6 12/31/2020     Lipid Panel ( most recent) Lipid Panel     Component Value Date/Time   CHOL 120 12/31/2020 2351   TRIG 45 12/31/2020 2351   HDL 53 12/31/2020 2351   CHOLHDL 2.3 12/31/2020 2351   VLDL 9 12/31/2020 2351   LDLCALC 58 12/31/2020 2351      Lab Results  Component Value Date   TSH 2.010 01/09/2022   TSH 2.136 10/20/2020   TSH 2.40 08/23/2020   TSH 1.93 12/15/2018   TSH 2.035 03/05/2018   TSH 2.287 11/04/2017   TSH 2.656 07/22/2017   FREET4 0.83 03/06/2018   FREET4 0.88 11/04/2017           Assessment & Plan:   1. Hyperthyroidism 2. Hearing loss, unspecified hearing loss type, unspecified laterality    - LESS WOOLSEY  is being seen at a kind request of [name], MD. - I have reviewed his available thyroid  records and clinically evaluated the patient. 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 She will be sent for thyroid uptake and scan to make a definitive diagnosis before definitive treatment. Encouraged to continue his amiodarone until assessment by his cardiologist.   - I did not initiate any new prescriptions today. - he is advised to maintain close follow up with [name], MD for primary care needs.   - Time spent with the patient: 46 minutes, of which >50% was spent in  counseling him about his hyperthyroidism and the rest in obtaining information about his symptoms, reviewing his previous labs/studies ( including abstractions from other facilities),  evaluations, and treatments,  and developing a plan to confirm diagnosis and long term treatment based on the latest standards of care/guidelines; and documenting his care.  Christopher [name] participated in the discussions, expressed understanding, and voiced agreement with the above plans.  All questions were answered to his satisfaction. he is encouraged to contact clinic should he have any questions or concerns prior to his return visit.  Follow up plan: Return in about 2 weeks (around 11/05/2022) for F/U with Thyroid Uptake and Scan.   Glade Lloyd, MD [facility] [address] [place], [place] [address] Phone: [phone]  Fax: [phone]     10/22/2022, 5:38 PM  This note was partially dictated with voice recognition software. Similar sounding words can be transcribed inadequately or may not  be corrected upon review.

## 2022-11-07 ENCOUNTER — Ambulatory Visit: Payer: Medicare Other | Admitting: "Endocrinology

## 2022-11-14 ENCOUNTER — Encounter (HOSPITAL_COMMUNITY)
Admission: RE | Admit: 2022-11-14 | Discharge: 2022-11-14 | Disposition: A | Discharge status: Home / Self Care | Payer: Medicare Other | Source: Ambulatory Visit | Attending: "Endocrinology | Admitting: "Endocrinology

## 2022-11-14 ENCOUNTER — Encounter (HOSPITAL_COMMUNITY): Payer: Self-pay

## 2022-11-14 DIAGNOSIS — E0590 Thyrotoxicosis, unspecified without thyrotoxic crisis or storm: Secondary | ICD-10-CM | POA: Insufficient documentation

## 2022-11-14 MED ORDER — SODIUM IODIDE I-123 7.4 MBQ CAPS
300.0000 | ORAL_CAPSULE | Freq: Once | ORAL | Status: AC
  Administered 2022-11-14: 314 via ORAL

## 2022-11-15 ENCOUNTER — Encounter (HOSPITAL_COMMUNITY)
Admission: RE | Admit: 2022-11-15 | Discharge: 2022-11-15 | Disposition: A | Discharge status: Home / Self Care | Payer: Medicare Other | Source: Ambulatory Visit | Attending: "Endocrinology | Admitting: "Endocrinology

## 2022-11-15 DIAGNOSIS — R946 Abnormal results of thyroid function studies: Secondary | ICD-10-CM | POA: Diagnosis not present

## 2022-11-15 DIAGNOSIS — R531 Weakness: Secondary | ICD-10-CM | POA: Diagnosis not present

## 2022-11-15 DIAGNOSIS — J029 Acute pharyngitis, unspecified: Secondary | ICD-10-CM | POA: Diagnosis not present

## 2022-11-26 DIAGNOSIS — Z08 Encounter for follow-up examination after completed treatment for malignant neoplasm: Secondary | ICD-10-CM | POA: Diagnosis not present

## 2022-11-26 DIAGNOSIS — Z85828 Personal history of other malignant neoplasm of skin: Secondary | ICD-10-CM | POA: Diagnosis not present

## 2022-11-27 ENCOUNTER — Encounter: Payer: Self-pay | Admitting: "Endocrinology

## 2022-11-27 ENCOUNTER — Ambulatory Visit: Payer: Medicare Other | Admitting: "Endocrinology

## 2022-11-27 VITALS — BP 118/66 | HR 52 | Ht 68.0 in | Wt 157.6 lb

## 2022-11-27 DIAGNOSIS — R7989 Other specified abnormal findings of blood chemistry: Secondary | ICD-10-CM | POA: Diagnosis not present

## 2022-11-27 NOTE — Progress Notes (Signed)
Endocrinology follow-up note                                             11/27/2022, 4:12 PM   Subjective:    Patient ID: Christopher [name], male    DOB: 1946-10-05, PCP [name], MD   Past Medical History:  Diagnosis Date   A-fib [facility])    Arthritis    "all over my body" (06/17/2017)   BPH (benign prostatic hyperplasia)    takes Proscar daily   CAD (coronary artery disease)    a. s/p CABG in 09/2019 with SVG-PDA-OM and LIMA-LAD with LAA excision   Chronic back pain    DDD; "upper and lower" (06/17/2017)   Constipation    takes Colace daily   Coronary artery disease    DDD (degenerative disc disease), cervical    DVT (deep venous thrombosis) ([place])    left peroneal vein 07/2017 (in setting of recent left TKA)   Dysrhythmia    history of Atrial fibrillation, no long has this   Enlarged prostate    GERD (gastroesophageal reflux disease)    takes Nexium daily   H/O hiatal hernia    History of colon polyps    benign   History of gastric ulcer    Insomnia    takes Xanax nightly   Joint capsule tear    left knee   Joint pain    Joint swelling    Macular degeneration    Muscle spasm of back    takes Valium daily as needed   PAF (paroxysmal atrial fibrillation) ([place])    Peripheral neuropathy    takes Gabapentin daily   PONV (postoperative nausea and vomiting)    Pre-diabetes    Sleep apnea    pt does not wear a cpap   Urinary hesitancy    Weakness    numbness and tingling   Past Surgical History:  Procedure Laterality Date   ANTERIOR CERVICAL DECOMP/DISCECTOMY FUSION  ~ 1991   "took bone from hip"   ANTERIOR CERVICAL DECOMP/DISCECTOMY FUSION  X 2   "put plate in"   ANTERIOR CERVICAL DISCECTOMY  <11/2005    5-6 and 4-5./notes 05/02/2011   APPENDECTOMY     BACK SURGERY     BIOPSY  05/30/2018   Procedure: BIOPSY;  Surgeon: [name], MD;  [facility];  Service: Endoscopy;;  esophagus   BIOPSY  01/09/2022   Procedure: BIOPSY;  Surgeon:  Harvel Quale, MD;  [facility];  Service: Gastroenterology;;   CARDIAC CATHETERIZATION     3-4 per pt   CARPAL TUNNEL RELEASE Bilateral 10/2005 - 11/2005   right-left /notes 05/02/2011   CATARACT EXTRACTION W/PHACO Right 05/24/2014   Procedure: CATARACT EXTRACTION PHACO AND INTRAOCULAR LENS PLACEMENT ([place]);  Surgeon: [name], MD;  Location: [facility];  Service: Ophthalmology;  Laterality: Right;  CDE 6.74   CATARACT EXTRACTION W/PHACO Left 06/17/2014   Procedure: CATARACT EXTRACTION PHACO AND INTRAOCULAR LENS PLACEMENT ([facility]);  Surgeon: [name], MD;  Location: [facility];  Service: Ophthalmology;  Laterality: Left;  CDE:4.65   COLONOSCOPY WITH ESOPHAGOGASTRODUODENOSCOPY (EGD) N/A 01/22/2013   Procedure: COLONOSCOPY WITH ESOPHAGOGASTRODUODENOSCOPY (EGD);  Surgeon: [name], MD;  [facility];  Service: Endoscopy;  Laterality: N/A;  140   COLONOSCOPY WITH PROPOFOL N/A 05/30/2018  diverticulosis and external hemorrhoids   CORONARY ARTERY BYPASS GRAFT  2020   CYSTOSCOPY WITH INSERTION OF UROLIFT N/A 04/16/2022   Procedure: CYSTOSCOPY WITH INSERTION OF UROLIFT;  Surgeon: Cleon Gustin, MD;  Location: [facility];  Service: Urology;  Laterality: N/A;   ESOPHAGOGASTRODUODENOSCOPY (EGD) WITH PROPOFOL N/A 05/30/2018   normal esophagus, presence of possible barretts (bx consistent with reflux changes, GAVE)   ESOPHAGOGASTRODUODENOSCOPY (EGD) WITH PROPOFOL N/A 01/09/2022   Procedure: ESOPHAGOGASTRODUODENOSCOPY (EGD) WITH PROPOFOL;  Surgeon: Harvel Quale, MD;  [facility];  Service: Gastroenterology;  Laterality: N/A;  305, moved up per Leigh Amm   EYE SURGERY Bilateral    "cleaned film w/laser; since my 1st cataract OR"   JOINT REPLACEMENT     LEFT HEART CATH AND CORONARY ANGIOGRAPHY N/A 02/16/2019   Procedure: LEFT HEART CATH AND CORONARY ANGIOGRAPHY;  Surgeon: Belva Crome, MD;  Location: [place] [facility];  Service: Cardiovascular;   Laterality: N/A;   POSTERIOR LUMBAR FUSION     "put a plate in"   QUADRICEPS TENDON REPAIR Left 07/01/2017   QUADRICEPS TENDON REPAIR Left 07/01/2017   Procedure: REPAIR QUADRICEP TENDON;  Surgeon: [name], MD;  Location: [place];  Service: Orthopedics;  Laterality: Left;   SHOULDER ARTHROSCOPY Bilateral    SPINAL CORD STIMULATOR INSERTION N/A 09/23/2015   Procedure: LUMBAR SPINAL CORD STIMULATOR INSERTION;  Surgeon: [name], MD;  Location: [place] [facility];  Service: Neurosurgery;  Laterality: N/A;  LUMBAR SPINAL CORD STIMULATOR INSERTION   SPINAL CORD STIMULATOR INSERTION N/A 04/25/2022   Procedure: REVISION OF  SPINAL CORD STIMULATOR;  Surgeon: Newman Pies, MD;  Location: [facility];  Service: Neurosurgery;  Laterality: N/A;   TOTAL KNEE ARTHROPLASTY Left 06/17/2017   TOTAL KNEE ARTHROPLASTY Left 06/17/2017   Procedure: TOTAL KNEE ARTHROPLASTY;  Surgeon: [name], MD;  Location: [place];  Service: Orthopedics;  Laterality: Left;   TOTAL KNEE ARTHROPLASTY Right 12/16/2017   Procedure: TOTAL KNEE ARTHROPLASTY;  Surgeon: [name], MD;  Location: [facility];  Service: Orthopedics;  Laterality: Right;   Social History   Socioeconomic History   Marital status: Divorced    Spouse name: Not on file   Number of children: 3   Years of education: Not on file   Highest education level: Not on file  Occupational History   Not on file  Tobacco Use   Smoking status: Former    Packs/day: 1.00    Years: 30.00    Total pack years: 30.00    Types: Cigarettes    Quit date: 01/14/1987    Years since quitting: 35.8   Smokeless tobacco: Former    Types: Chew   Tobacco comments:    quit smoking  & chewing by 1987  Vaping Use   Vaping Use: Never used  Substance and Sexual Activity   Alcohol use: Yes    Comment: 2-3 beers per night   Drug use: No   Sexual activity: Not Currently  Other Topics Concern   Not on file  Social History Narrative   Not on file   Social Determinants of Health    Financial Resource Strain: Not on file  Food Insecurity: Not on file  Transportation Needs: Not on file  Physical Activity: Not on file  Stress: Not on file  Social Connections: Not on file   History reviewed. No pertinent family history. Outpatient Encounter Medications as of 11/27/2022  Medication Sig   alfuzosin (UROXATRAL) 10 MG 24 hr tablet Take 1 tablet  by mouth daily with breakfast.   ALPRAZolam (XANAX) 1 MG tablet Take 1 mg by mouth at bedtime.   amiodarone (PACERONE) 200 MG tablet TAKE 1 TABLET BY MOUTH EVERY DAY (Patient taking differently: Take 200 mg by mouth daily.)   apixaban (ELIQUIS) 5 MG TABS tablet Take 1 tablet (5 mg total) by mouth 2 (two) times daily. *Restart 04/28/2022* (Patient taking differently: Take 5 mg by mouth 2 (two) times daily.)   ascorbic acid (VITAMIN C) 500 MG tablet Take 500 mg by mouth daily.   aspirin EC 81 MG tablet Take 1 tablet (81 mg total) by mouth daily.   atorvastatin (LIPITOR) 80 MG tablet Take 1 tablet (80 mg total) by mouth daily.   Calcium Polycarbophil (FIBER-CAPS PO) Take 2 capsules by mouth daily.   Cholecalciferol (VITAMIN D3) 10 MCG (400 UNIT) CAPS Take 400 Units by mouth daily.   Cyanocobalamin (B-12 PO) Take 1 tablet by mouth daily.   docusate sodium (COLACE) 250 MG capsule Take 250 mg by mouth daily.   finasteride (PROSCAR) 5 MG tablet TAKE 1 TABLET (5 MG TOTAL) BY MOUTH DAILY.   furosemide (LASIX) 20 MG tablet Take 1 tablet (20 mg total) by mouth daily.   gabapentin (NEURONTIN) 400 MG capsule One three times a day (Patient taking differently: Take 400 mg by mouth 4 (four) times daily.)   isosorbide mononitrate (IMDUR) 60 MG 24 hr tablet Take 60 mg by mouth daily.   lubiprostone (AMITIZA) 24 MCG capsule Take 1 capsule (24 mcg total) by mouth 2 (two) times daily with a meal.   metoprolol tartrate (LOPRESSOR) 25 MG tablet Take 25 mg by mouth 2 (two) times daily.   nitroGLYCERIN (NITROSTAT) 0.4 MG SL tablet Place 1 tablet (0.4 mg  total) under the tongue every 5 (five) minutes as needed for chest pain.   Omega-3 1000 MG CAPS Take 1,000 mg by mouth daily.   pantoprazole (PROTONIX) 40 MG tablet TAKE 1 TABLET BY MOUTH TWICE A DAY (30 TO 60 MINUTES BEFORE 1ST AND LAST MEALS OF THE DAY) (Patient taking differently: 40 mg 2 (two) times daily. (30 TO 60 MINUTES BEFORE 1ST AND LAST MEALS OF THE DAY))   potassium chloride (KLOR-CON) 10 MEQ tablet Take 10 mEq by mouth daily.   silodosin (RAPAFLO) 8 MG CAPS capsule Take 1 capsule (8 mg total) by mouth at bedtime.   Simethicone 180 MG CAPS Take 1 capsule by mouth 3 (three) times daily.   traMADol (ULTRAM) 50 MG tablet Take 1-2 tablets (50-100 mg total) by mouth every 4 (four) hours as needed.   vitamin E 200 UNIT capsule Take 200 Units by mouth daily.   Zinc Sulfate (ZINC 15 PO) Take 15 mg by mouth daily.   No facility-administered encounter medications on file as of 11/27/2022.   ALLERGIES: Allergies  Allergen Reactions   Tylenol [Acetaminophen] Other (See Comments)    Stomach pain    Morphine And Related Nausea And Vomiting   Propofol Nausea And Vomiting    VACCINATION STATUS: Immunization History  Administered Date(s) Administered   Fluad Quad(high Dose 65+) 12/19/2020   Influenza Inj Mdck Quad Pf 11/05/2018   Influenza, High Dose Seasonal PF 10/09/2019   Influenza,inj,quad, With Preservative 12/20/2018   Pneumococcal Conjugate-13 10/09/2019   Tdap 07/22/2019    HPI Christopher [name] is 76 y.o. male who presents today with a medical history as above. he is being seen in follow up after he was seen in consultation for hyperthyroidism requested by Hilma Favors,  Jenny Reichmann, MD.   Patient is not optimal historian, and hard of hearing. He was sent for thyroid uptake and scan after his first visit. See below. He does not have any new complaints.   He does not think his thyroid is causing his weight loss.  He is not on any antithyroid intervention.  He is taking amiodarone 200 mg p.o.  daily from his cardiologist.  He denies any family history of thyroid dysfunction or thyroid malignancy. He is not on polypharmacy. He has multiple medical problems including coronary artery disease, hypertension, hyperlipidemia. Review of Systems  Constitutional: no recent weight gain/loss, no fatigue, no subjective hyperthermia, no subjective hypothermia Eyes: no blurry vision, no xerophthalmia ENT: no sore throat, no nodules palpated in throat, no dysphagia/odynophagia, no hoarseness   Objective:       11/27/2022    3:15 PM 10/22/2022    2:24 PM 09/25/2022    3:26 PM  Vitals with BMI  Height '5\' 8"'$  '5\' 8"'$  '5\' 8"'$   Weight 157 lbs 10 oz 158 lbs 13 oz 157 lbs  BMI 23.97 44.96 75.91  Systolic 638 466 599  Diastolic 66 68 62  Pulse 52 60 56    BP 118/66   Pulse (!) 52   Ht '5\' 8"'$  (1.727 m)   Wt 157 lb 9.6 oz (71.5 kg)   BMI 23.96 kg/m   Wt Readings from Last 3 Encounters:  11/27/22 157 lb 9.6 oz (71.5 kg)  10/22/22 158 lb 12.8 oz (72 kg)  09/25/22 157 lb (71.2 kg)    Physical Exam  Constitutional:  Body mass index is 23.96 kg/m.,  not in acute distress, normal state of mind Eyes: PERRLA, EOMI, no exophthalmos ENT: moist mucous membranes, no gross thyromegaly, no gross cervical lymphadenopathy   CMP ( most recent) CMP     Component Value Date/Time   NA 139 08/24/2022 0444   NA 145 (H) 02/11/2019 1255   K 3.7 08/24/2022 0444   CL 111 08/24/2022 0444   CO2 24 08/24/2022 0444   GLUCOSE 97 08/24/2022 0444   BUN <5 (L) 08/24/2022 0444   BUN 10 02/11/2019 1255   CREATININE 0.72 08/24/2022 0444   CALCIUM 8.4 (L) 08/24/2022 0444   PROT 5.2 (L) 08/24/2022 0444   ALBUMIN 2.4 (L) 08/24/2022 0444   AST 34 08/24/2022 0444   ALT 45 (H) 08/24/2022 0444   ALKPHOS 82 08/24/2022 0444   BILITOT 1.0 08/24/2022 0444   GFRNONAA >60 08/24/2022 0444   GFRAA 101 02/11/2019 1255     Diabetic Labs (most recent): Lab Results  Component Value Date   HGBA1C 5.6 12/31/2020      Lipid Panel ( most recent) Lipid Panel     Component Value Date/Time   CHOL 120 12/31/2020 2351   TRIG 45 12/31/2020 2351   HDL 53 12/31/2020 2351   CHOLHDL 2.3 12/31/2020 2351   VLDL 9 12/31/2020 2351   LDLCALC 58 12/31/2020 2351      Lab Results  Component Value Date   TSH 2.010 01/09/2022   TSH 2.136 10/20/2020   TSH 2.40 08/23/2020   TSH 1.93 12/15/2018   TSH 2.035 03/05/2018   TSH 2.287 11/04/2017   TSH 2.656 07/22/2017   FREET4 0.83 03/06/2018   FREET4 0.88 11/04/2017        FINDINGS: Very low uptake within thyroid gland by imaging. Thyroid gland shape is faintly discern.   4 hour I-123 uptake = 1.2% (normal 5-20%), 24 hour I-123 uptake = 1.4% (normal  10-30%)   IMPRESSION: Decreased iodine uptake by imaging and direct measurement. Coupled with depressed TSH, findings suggestive of subacute thyroiditis. Recommend correlation   Assessment & Plan:   1. Subacute thyroiditis 2. Hearing loss, unspecified hearing loss type, unspecified laterality   - I have reviewed his available thyroid records and clinically evaluated the patient. 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 He will not need antithyroid intervention for now. Encouraged to continue his amiodarone until assessment by his cardiologist. He will need repeat thyroid function test in 3 months.   - I did not initiate any new prescriptions today. - he is advised to maintain close follow up with [name], MD for primary care needs.   I spent  22 minutes in the care of the patient today including review of labs from Thyroid Function, CMP, and other relevant labs ; imaging/biopsy records (current and previous including abstractions from other facilities); face-to-face time discussing  his lab results and symptoms, medications doses, his options of short and long term treatment based on the latest standards of care / guidelines;    and documenting the encounter.  Christopher [name]  participated in the discussions, expressed understanding, and voiced agreement with the above plans.  All questions were answered to his satisfaction. he is encouraged to contact clinic should he have any questions or concerns prior to his return visit.   Follow up plan: Return in about 3 months (around 02/26/2023) for F/U with Pre-visit Labs.   Glade Lloyd, MD [facility] [address] [place], [place] [address] Phone: [phone]  Fax: [phone]     11/27/2022, 4:12 PM  This note was partially dictated with voice recognition software. Similar sounding words can be transcribed inadequately or may not  be corrected upon review.

## 2022-12-20 DIAGNOSIS — T85192A Other mechanical complication of implanted electronic neurostimulator (electrode) of spinal cord, initial encounter: Secondary | ICD-10-CM | POA: Diagnosis not present

## 2022-12-20 DIAGNOSIS — G4700 Insomnia, unspecified: Secondary | ICD-10-CM | POA: Diagnosis not present

## 2022-12-20 DIAGNOSIS — I25119 Atherosclerotic heart disease of native coronary artery with unspecified angina pectoris: Secondary | ICD-10-CM | POA: Diagnosis not present

## 2022-12-20 DIAGNOSIS — Z6823 Body mass index (BMI) 23.0-23.9, adult: Secondary | ICD-10-CM | POA: Diagnosis not present

## 2022-12-20 DIAGNOSIS — I482 Chronic atrial fibrillation, unspecified: Secondary | ICD-10-CM | POA: Diagnosis not present

## 2022-12-20 DIAGNOSIS — I700 Atherosclerosis of aorta: Secondary | ICD-10-CM | POA: Diagnosis not present

## 2022-12-28 ENCOUNTER — Other Ambulatory Visit: Payer: Self-pay | Admitting: Urology

## 2023-01-08 NOTE — Telephone Encounter (Signed)
Hi Mr. Beauchaine,   I called the [facility] on [address] to get atorvastatin filled and it should be ready later this evening. Happy (early) Birthday!!!!  Thank you for allowing pharmacy to be a part of this patient's care. Kristeen Miss, PharmD Clinical Pharmacist [place] Cell: [phone]

## 2023-01-20 ENCOUNTER — Other Ambulatory Visit: Payer: Self-pay | Admitting: Internal Medicine

## 2023-02-10 ENCOUNTER — Other Ambulatory Visit: Payer: Self-pay | Admitting: Student

## 2023-02-27 DIAGNOSIS — G250 Essential tremor: Secondary | ICD-10-CM | POA: Diagnosis not present

## 2023-02-27 DIAGNOSIS — R269 Unspecified abnormalities of gait and mobility: Secondary | ICD-10-CM | POA: Diagnosis not present

## 2023-02-28 ENCOUNTER — Ambulatory Visit: Payer: Medicare Other | Admitting: "Endocrinology

## 2023-02-28 DIAGNOSIS — E0590 Thyrotoxicosis, unspecified without thyrotoxic crisis or storm: Secondary | ICD-10-CM | POA: Diagnosis not present

## 2023-02-28 DIAGNOSIS — R7989 Other specified abnormal findings of blood chemistry: Secondary | ICD-10-CM | POA: Diagnosis not present

## 2023-03-02 LAB — THYROID PEROXIDASE ANTIBODY: Thyroperoxidase Ab SerPl-aCnc: 17 IU/mL (ref 0–34)

## 2023-03-02 LAB — THYROGLOBULIN ANTIBODY: Thyroglobulin Antibody: <1 IU/mL (ref 0.0–0.9)

## 2023-03-02 LAB — T3, FREE: T3, Free: 2.3 pg/mL (ref 2.0–4.4)

## 2023-03-02 LAB — TSH: TSH: 4.94 u[IU]/mL — ABNORMAL HIGH (ref 0.450–4.500)

## 2023-03-02 LAB — T4, FREE: Free T4: 1.5 ng/dL (ref 0.82–1.77)

## 2023-03-11 ENCOUNTER — Ambulatory Visit: Payer: Medicare Other | Admitting: "Endocrinology

## 2023-03-11 ENCOUNTER — Encounter: Payer: Self-pay | Admitting: "Endocrinology

## 2023-03-11 VITALS — BP 112/64 | HR 52 | Ht 68.0 in | Wt 154.4 lb

## 2023-03-11 DIAGNOSIS — R7989 Other specified abnormal findings of blood chemistry: Secondary | ICD-10-CM

## 2023-03-11 NOTE — Progress Notes (Signed)
Endocrinology follow-up note                                             03/11/2023, 12:55 PM   Subjective:    Patient ID: Christopher [name], male    DOB: 1946/05/08, PCP [name], MD   Past Medical History:  Diagnosis Date   A-fib [facility])    Arthritis    "all over my body" (06/17/2017)   BPH (benign prostatic hyperplasia)    takes Proscar daily   CAD (coronary artery disease)    a. s/p CABG in 09/2019 with SVG-PDA-OM and LIMA-LAD with LAA excision   Chronic back pain    DDD; "upper and lower" (06/17/2017)   Constipation    takes Colace daily   Coronary artery disease    DDD (degenerative disc disease), cervical    DVT (deep venous thrombosis) ([place])    left peroneal vein 07/2017 (in setting of recent left TKA)   Dysrhythmia    history of Atrial fibrillation, no long has this   Enlarged prostate    GERD (gastroesophageal reflux disease)    takes Nexium daily   H/O hiatal hernia    History of colon polyps    benign   History of gastric ulcer    Insomnia    takes Xanax nightly   Joint capsule tear    left knee   Joint pain    Joint swelling    Macular degeneration    Muscle spasm of back    takes Valium daily as needed   PAF (paroxysmal atrial fibrillation) ([place])    Peripheral neuropathy    takes Gabapentin daily   PONV (postoperative nausea and vomiting)    Pre-diabetes    Sleep apnea    pt does not wear a cpap   Urinary hesitancy    Weakness    numbness and tingling   Past Surgical History:  Procedure Laterality Date   ANTERIOR CERVICAL DECOMP/DISCECTOMY FUSION  ~ 1991   "took bone from hip"   ANTERIOR CERVICAL DECOMP/DISCECTOMY FUSION  X 2   "put plate in"   ANTERIOR CERVICAL DISCECTOMY  <11/2005    5-6 and 4-5./notes 05/02/2011   APPENDECTOMY     BACK SURGERY     BIOPSY  05/30/2018   Procedure: BIOPSY;  Surgeon: [name], MD;  [facility];  Service: Endoscopy;;  esophagus   BIOPSY  01/09/2022   Procedure: BIOPSY;  Surgeon:  Harvel Quale, MD;  [facility];  Service: Gastroenterology;;   CARDIAC CATHETERIZATION     3-4 per pt   CARPAL TUNNEL RELEASE Bilateral 10/2005 - 11/2005   right-left /notes 05/02/2011   CATARACT EXTRACTION W/PHACO Right 05/24/2014   Procedure: CATARACT EXTRACTION PHACO AND INTRAOCULAR LENS PLACEMENT ([place]);  Surgeon: [name], MD;  Location: [facility];  Service: Ophthalmology;  Laterality: Right;  CDE 6.74   CATARACT EXTRACTION W/PHACO Left 06/17/2014   Procedure: CATARACT EXTRACTION PHACO AND INTRAOCULAR LENS PLACEMENT (I[facility]);  Surgeon: [name], MD;  Location: [facility];  Service: Ophthalmology;  Laterality: Left;  CDE:4.65   COLONOSCOPY WITH ESOPHAGOGASTRODUODENOSCOPY (EGD) N/A 01/22/2013   Procedure: COLONOSCOPY WITH ESOPHAGOGASTRODUODENOSCOPY (EGD);  Surgeon: [name], MD;  [facility];  Service: Endoscopy;  Laterality: N/A;  140   COLONOSCOPY WITH PROPOFOL N/A 05/30/2018  diverticulosis and external hemorrhoids   CORONARY ARTERY BYPASS GRAFT  2020   CYSTOSCOPY WITH INSERTION OF UROLIFT N/A 04/16/2022   Procedure: CYSTOSCOPY WITH INSERTION OF UROLIFT;  Surgeon: Cleon Gustin, MD;  Location: [facility];  Service: Urology;  Laterality: N/A;   ESOPHAGOGASTRODUODENOSCOPY (EGD) WITH PROPOFOL N/A 05/30/2018   normal esophagus, presence of possible barretts (bx consistent with reflux changes, GAVE)   ESOPHAGOGASTRODUODENOSCOPY (EGD) WITH PROPOFOL N/A 01/09/2022   Procedure: ESOPHAGOGASTRODUODENOSCOPY (EGD) WITH PROPOFOL;  Surgeon: Harvel Quale, MD;  [facility];  Service: Gastroenterology;  Laterality: N/A;  305, moved up per Leigh Amm   EYE SURGERY Bilateral    "cleaned film w/laser; since my 1st cataract OR"   JOINT REPLACEMENT     LEFT HEART CATH AND CORONARY ANGIOGRAPHY N/A 02/16/2019   Procedure: LEFT HEART CATH AND CORONARY ANGIOGRAPHY;  Surgeon: Belva Crome, MD;  Location: [place] [facility];  Service: Cardiovascular;   Laterality: N/A;   POSTERIOR LUMBAR FUSION     "put a plate in"   QUADRICEPS TENDON REPAIR Left 07/01/2017   QUADRICEPS TENDON REPAIR Left 07/01/2017   Procedure: REPAIR QUADRICEP TENDON;  Surgeon: [name], MD;  Location: [place];  Service: Orthopedics;  Laterality: Left;   SHOULDER ARTHROSCOPY Bilateral    SPINAL CORD STIMULATOR INSERTION N/A 09/23/2015   Procedure: LUMBAR SPINAL CORD STIMULATOR INSERTION;  Surgeon: [name], MD;  Location: [place] [facility];  Service: Neurosurgery;  Laterality: N/A;  LUMBAR SPINAL CORD STIMULATOR INSERTION   SPINAL CORD STIMULATOR INSERTION N/A 04/25/2022   Procedure: REVISION OF  SPINAL CORD STIMULATOR;  Surgeon: Newman Pies, MD;  Location: [place];  Service: Neurosurgery;  Laterality: N/A;   TOTAL KNEE ARTHROPLASTY Left 06/17/2017   TOTAL KNEE ARTHROPLASTY Left 06/17/2017   Procedure: TOTAL KNEE ARTHROPLASTY;  Surgeon: [name], MD;  Location: [place];  Service: Orthopedics;  Laterality: Left;   TOTAL KNEE ARTHROPLASTY Right 12/16/2017   Procedure: TOTAL KNEE ARTHROPLASTY;  Surgeon: [name], MD;  Location: [place];  Service: Orthopedics;  Laterality: Right;   Social History   Socioeconomic History   Marital status: Divorced    Spouse name: Not on file   Number of children: 3   Years of education: Not on file   Highest education level: Not on file  Occupational History   Not on file  Tobacco Use   Smoking status: Former    Packs/day: 1.00    Years: 30.00    Additional pack years: 0.00    Total pack years: 30.00    Types: Cigarettes    Quit date: 01/14/1987    Years since quitting: 36.1   Smokeless tobacco: Former    Types: Chew   Tobacco comments:    quit smoking  & chewing by 1987  Vaping Use   Vaping Use: Never used  Substance and Sexual Activity   Alcohol use: Yes    Comment: 2-3 beers per night   Drug use: No   Sexual activity: Not Currently  Other Topics Concern   Not on file  Social History Narrative   Not on file    Social Determinants of Health   Financial Resource Strain: Not on file  Food Insecurity: Not on file  Transportation Needs: Not on file  Physical Activity: Not on file  Stress: Not on file  Social Connections: Not on file   History reviewed. No pertinent family history. Outpatient Encounter Medications as of 03/11/2023  Medication Sig   alfuzosin (UROXATRAL) 10  MG 24 hr tablet Take 1 tablet by mouth daily with breakfast.   ALPRAZolam (XANAX) 1 MG tablet Take 1 mg by mouth at bedtime.   amiodarone (PACERONE) 200 MG tablet Take 1 tablet (200 mg total) by mouth daily.   apixaban (ELIQUIS) 5 MG TABS tablet Take 1 tablet (5 mg total) by mouth 2 (two) times daily. *Restart 04/28/2022* (Patient taking differently: Take 5 mg by mouth 2 (two) times daily.)   ascorbic acid (VITAMIN C) 500 MG tablet Take 500 mg by mouth daily.   aspirin EC 81 MG tablet Take 1 tablet (81 mg total) by mouth daily.   atorvastatin (LIPITOR) 80 MG tablet TAKE 1 TABLET BY MOUTH EVERY DAY   Calcium Polycarbophil (FIBER-CAPS PO) Take 2 capsules by mouth daily.   Cholecalciferol (VITAMIN D3) 10 MCG (400 UNIT) CAPS Take 400 Units by mouth daily.   Cyanocobalamin (B-12 PO) Take 1 tablet by mouth daily.   docusate sodium (COLACE) 250 MG capsule Take 250 mg by mouth daily.   finasteride (PROSCAR) 5 MG tablet TAKE 1 TABLET (5 MG TOTAL) BY MOUTH DAILY.   furosemide (LASIX) 20 MG tablet Take 1 tablet (20 mg total) by mouth daily.   gabapentin (NEURONTIN) 400 MG capsule One three times a day (Patient taking differently: Take 400 mg by mouth 4 (four) times daily.)   isosorbide mononitrate (IMDUR) 60 MG 24 hr tablet Take 60 mg by mouth daily.   nitroGLYCERIN (NITROSTAT) 0.4 MG SL tablet Place 1 tablet (0.4 mg total) under the tongue every 5 (five) minutes as needed for chest pain.   Omega-3 1000 MG CAPS Take 1,000 mg by mouth daily.   pantoprazole (PROTONIX) 40 MG tablet TAKE 1 TABLET BY MOUTH TWICE A DAY (30 TO 60 MINUTES BEFORE  1ST AND LAST MEALS OF THE DAY) (Patient taking differently: 40 mg 2 (two) times daily. (30 TO 60 MINUTES BEFORE 1ST AND LAST MEALS OF THE DAY))   potassium chloride (KLOR-CON) 10 MEQ tablet Take 10 mEq by mouth daily.   silodosin (RAPAFLO) 8 MG CAPS capsule Take 1 capsule (8 mg total) by mouth at bedtime.   Simethicone 180 MG CAPS Take 1 capsule by mouth 3 (three) times daily.   traMADol (ULTRAM) 50 MG tablet Take 1-2 tablets (50-100 mg total) by mouth every 4 (four) hours as needed.   vitamin E 200 UNIT capsule Take 200 Units by mouth daily.   Zinc Sulfate (ZINC 15 PO) Take 15 mg by mouth daily.   [DISCONTINUED] lubiprostone (AMITIZA) 24 MCG capsule Take 1 capsule (24 mcg total) by mouth 2 (two) times daily with a meal.   [DISCONTINUED] metoprolol tartrate (LOPRESSOR) 25 MG tablet Take 25 mg by mouth 2 (two) times daily.   [DISCONTINUED] sulfamethoxazole-trimethoprim (BACTRIM DS) 800-160 MG tablet TAKE 1 TABLET BY MOUTH EVERY 12 HOURS   No facility-administered encounter medications on file as of 03/11/2023.   ALLERGIES: Allergies  Allergen Reactions   Tylenol [Acetaminophen] Other (See Comments)    Stomach pain    Morphine And Related Nausea And Vomiting   Propofol Nausea And Vomiting    VACCINATION STATUS: Immunization History  Administered Date(s) Administered   Fluad Quad(high Dose 65+) 12/19/2020   Influenza Inj Mdck Quad Pf 11/05/2018   Influenza, High Dose Seasonal PF 10/09/2019   Influenza,inj,quad, With Preservative 12/20/2018   Pneumococcal Conjugate-13 10/09/2019   Tdap 07/22/2019    HPI INRI GUNION is 77 y.o. male who presents today with a medical history as above. he  is being seen in follow up after he was seen in consultation for hyperthyroidism requested by [name], MD.   Patient is not optimal historian, and hard of hearing. He was sent for thyroid uptake and scan after his first visit. See below. He does not have any new complaints.   He remains on  amiodarone for cardiac dysrhythmia.  He presents with steady weight.  His previsit labs are consistent with mild subclinical hypothyroidism.    He is taking amiodarone 200 mg p.o. daily from his cardiologist.  He denies any family history of thyroid dysfunction or thyroid malignancy. He is not on polypharmacy. He has multiple medical problems including coronary artery disease, hypertension, hyperlipidemia. Review of Systems  Constitutional: no recent weight gain/loss, no fatigue, no subjective hyperthermia, no subjective hypothermia Eyes: no blurry vision, no xerophthalmia ENT: no sore throat, no nodules palpated in throat, no dysphagia/odynophagia, no hoarseness   Objective:       03/11/2023    8:57 AM 11/27/2022    3:15 PM 10/22/2022    2:24 PM  Vitals with BMI  Height 5\' 8"  5\' 8"  5\' 8"   Weight 154 lbs 6 oz 157 lbs 10 oz 158 lbs 13 oz  BMI 23.48 [id number]  Systolic [id number]  Diastolic 64 66 68  Pulse 52 52 60    BP 112/64   Pulse (!) 52   Ht 5\' 8"  (1.727 m)   Wt 154 lb 6.4 oz (70 kg)   BMI 23.48 kg/m   Wt Readings from Last 3 Encounters:  03/11/23 154 lb 6.4 oz (70 kg)  11/27/22 157 lb 9.6 oz (71.5 kg)  10/22/22 158 lb 12.8 oz (72 kg)    Physical Exam  Constitutional:  Body mass index is 23.48 kg/m.,  not in acute distress, normal state of mind Eyes: PERRLA, EOMI, no exophthalmos ENT: moist mucous membranes, no gross thyromegaly, no gross cervical lymphadenopathy   CMP ( most recent) CMP     Component Value Date/Time   NA 139 08/24/2022 0444   NA 145 (H) 02/11/2019 1255   K 3.7 08/24/2022 0444   CL 111 08/24/2022 0444   CO2 24 08/24/2022 0444   GLUCOSE 97 08/24/2022 0444   BUN <5 (L) 08/24/2022 0444   BUN 10 02/11/2019 1255   CREATININE 0.72 08/24/2022 0444   CALCIUM 8.4 (L) 08/24/2022 0444   PROT 5.2 (L) 08/24/2022 0444   ALBUMIN 2.4 (L) 08/24/2022 0444   AST 34 08/24/2022 0444   ALT 45 (H) 08/24/2022 0444   ALKPHOS 82 08/24/2022 0444   BILITOT  1.0 08/24/2022 0444   GFRNONAA >60 08/24/2022 0444   GFRAA 101 02/11/2019 1255     Diabetic Labs (most recent): Lab Results  Component Value Date   HGBA1C 5.6 12/31/2020     Lipid Panel ( most recent) Lipid Panel     Component Value Date/Time   CHOL 120 12/31/2020 2351   TRIG 45 12/31/2020 2351   HDL 53 12/31/2020 2351   CHOLHDL 2.3 12/31/2020 2351   VLDL 9 12/31/2020 2351   LDLCALC 58 12/31/2020 2351      Lab Results  Component Value Date   TSH 4.940 (H) 02/28/2023   TSH 2.010 01/09/2022   TSH 2.136 10/20/2020   TSH 2.40 08/23/2020   TSH 1.93 12/15/2018   TSH 2.035 03/05/2018   TSH 2.287 11/04/2017   TSH 2.656 07/22/2017   FREET4 1.50 02/28/2023   FREET4 0.83 03/06/2018   FREET4 0.88 11/04/2017  FINDINGS: Very low uptake within thyroid gland by imaging. Thyroid gland shape is faintly discern.   4 hour I-123 uptake = 1.2% (normal 5-20%), 24 hour I-123 uptake = 1.4% (normal 10-30%)   IMPRESSION: Decreased iodine uptake by imaging and direct measurement. Coupled with depressed TSH, findings suggestive of subacute thyroiditis. Recommend correlation   Assessment & Plan:   1. Subacute thyroiditis 2. Hearing loss, unspecified hearing loss type, unspecified laterality   - I have reviewed his available thyroid records and clinically evaluated the patient. 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 -She has now resolved this condition and presents with subclinical hypothyroidism.  He will not need antithyroid intervention, nor thyroid hormone supplement at this time.  He will have thyroid function test repeated before his next visit in 6 months. Encouraged to continue his amiodarone until assessment by his cardiologist.  - I did not initiate any new prescriptions today. - he is advised to maintain close follow up with [name], MD for primary care needs.   I spent  22   minutes in the care of the patient today including review of labs from Thyroid Function, CMP, and other relevant labs ; imaging/biopsy records (current and previous including abstractions from other facilities); face-to-face time discussing  his lab results and symptoms, medications doses, his options of short and long term treatment based on the latest standards of care / guidelines;   and documenting the encounter.  Christopher [name]  participated in the discussions, expressed understanding, and voiced agreement with the above plans.  All questions were answered to his satisfaction. he is encouraged to contact clinic should he have any questions or concerns prior to his return visit.   Follow up plan: Return in about 6 months (around 09/11/2023) for F/U with Pre-visit Labs.   Glade Lloyd, MD [facility] [address] [place], [place] [address] Phone: [phone]  Fax: [phone]     03/11/2023, 12:55 PM  This note was partially dictated with voice recognition software. Similar sounding words can be transcribed inadequately or may not  be corrected upon review.

## 2023-03-25 NOTE — Progress Notes (Deleted)
Cardiology Office Note    Date:  03/25/2023   ID:  Tennison, Slomka [birth year], MRN [id number]  PCP:  Assunta Found, MD  Cardiologist: Charlton Haws, MD   EP: Dr. Ladona Ridgel   History of Present Illness:    Christopher [name] is a 77 y.o. male with past medical history of CAD (s/p CABG in 09/2019 with SVG-PDA-OM and LIMA-LAD with LAA excision), paroxysmal atrial fibrillation, OSA (intolerant to CPAP), HTN and HLD who presents to the office today for F/U   Admitted to [facility] in 08/2022 for severe sepsis in the setting of Enterococcus bacteremia. Was also found to have acute cholecystitis during admission. Cardiology was consulted for preoperative cardiac clearance in regards to possible cholecystectomy and he was cleared to proceed. Was noted to have intermittent atrial fibrillation during admission and had been on Amiodarone prior to admission, therefore this was restarted. General Surgery followed the patient during admission and HIDA scan showed no evidence of acute cholecystitis and he did not require cholecystectomy.  In talking with the patient today, doing well No angina Intolerant to CPAP for his OSA   TTE 08/20/22 EF 55-60% moderate bi atrial enlargement   ***   Past Medical History:  Diagnosis Date   A-fib [facility])    Arthritis    "all over my body" (06/17/2017)   BPH (benign prostatic hyperplasia)    takes Proscar daily   CAD (coronary artery disease)    a. s/p CABG in 09/2019 with SVG-PDA-OM and LIMA-LAD with LAA excision   Chronic back pain    DDD; "upper and lower" (06/17/2017)   Constipation    takes Colace daily   Coronary artery disease    DDD (degenerative disc disease), cervical    DVT (deep venous thrombosis) (HCC)    left peroneal vein 07/2017 (in setting of recent left TKA)   Dysrhythmia    history of Atrial fibrillation, no long has this   Enlarged prostate    GERD (gastroesophageal reflux disease)    takes Nexium daily   H/O hiatal hernia    History of colon  polyps    benign   History of gastric ulcer    Insomnia    takes Xanax nightly   Joint capsule tear    left knee   Joint pain    Joint swelling    Macular degeneration    Muscle spasm of back    takes Valium daily as needed   PAF (paroxysmal atrial fibrillation) (HCC)    Peripheral neuropathy    takes Gabapentin daily   PONV (postoperative nausea and vomiting)    Pre-diabetes    Sleep apnea    pt does not wear a cpap   Urinary hesitancy    Weakness    numbness and tingling    Past Surgical History:  Procedure Laterality Date   ANTERIOR CERVICAL DECOMP/DISCECTOMY FUSION  ~ 1991   "took bone from hip"   ANTERIOR CERVICAL DECOMP/DISCECTOMY FUSION  X 2   "put plate in"   ANTERIOR CERVICAL DISCECTOMY  <11/2005    5-6 and 4-5./notes 05/02/2011   APPENDECTOMY     BACK SURGERY     BIOPSY  05/30/2018   Procedure: BIOPSY;  Surgeon: Malissa Hippo, MD;  [facility];  Service: Endoscopy;;  esophagus   BIOPSY  01/09/2022   Procedure: BIOPSY;  Surgeon: Dolores Frame, MD;  Location: [facility];  Service: Gastroenterology;;   CARDIAC CATHETERIZATION     3-4  per pt   CARPAL TUNNEL RELEASE Bilateral 10/2005 - 11/2005   right-left /notes 05/02/2011   CATARACT EXTRACTION W/PHACO Right 05/24/2014   Procedure: CATARACT EXTRACTION PHACO AND INTRAOCULAR LENS PLACEMENT (IOC);  Surgeon: Gemma Payor, MD;  Location: [facility] ORS;  Service: Ophthalmology;  Laterality: Right;  CDE 6.74   CATARACT EXTRACTION W/PHACO Left 06/17/2014   Procedure: CATARACT EXTRACTION PHACO AND INTRAOCULAR LENS PLACEMENT (IOC);  Surgeon: Gemma Payor, MD;  Location: [facility] OR[facility];  Service: Ophthalmology;  Laterality: Left;  CDE:4.65   COLONOSCOPY WITH ESOPHAGOGASTRODUODENOSCOPY (EGD) N/A 01/22/2013   Procedure: COLONOSCOPY WITH ESOPHAGOGASTRODUODENOSCOPY (EGD);  Surgeon: Malissa Hippo, MD;  [facility]: [facility];  Service: Endoscopy;  Laterality: N/A;  140   COLONOSCOPY WITH PROPOFOL N/A 05/30/2018    diverticulosis and external hemorrhoids   CORONARY ARTERY BYPASS GRAFT  2020   CYSTOSCOPY WITH INSERTION OF UROLIFT N/A 04/16/2022   Procedure: CYSTOSCOPY WITH INSERTION OF UROLIFT;  Surgeon: Malen Gauze, MD;  Location: [facility];  Service: Urology;  Laterality: N/A;   ESOPHAGOGASTRODUODENOSCOPY (EGD) WITH PROPOFOL N/A 05/30/2018   normal esophagus, presence of possible barretts (bx consistent with reflux changes, GAVE)   ESOPHAGOGASTRODUODENOSCOPY (EGD) WITH PROPOFOL N/A 01/09/2022   Procedure: ESOPHAGOGASTRODUODENOSCOPY (EGD) WITH PROPOFOL;  Surgeon: Dolores Frame, MD;  [facility];  Service: Gastroenterology;  Laterality: N/A;  305, moved up per Leigh Amm   EYE SURGERY Bilateral    "cleaned film w/laser; since my 1st cataract OR"   JOINT REPLACEMENT     LEFT HEART CATH AND CORONARY ANGIOGRAPHY N/A 02/16/2019   Procedure: LEFT HEART CATH AND CORONARY ANGIOGRAPHY;  Surgeon: Lyn Records, MD;  [facility]: [facility];  Service: Cardiovascular;  Laterality: N/A;   POSTERIOR LUMBAR FUSION     "put a plate in"   QUADRICEPS TENDON REPAIR Left 07/01/2017   QUADRICEPS TENDON REPAIR Left 07/01/2017   Procedure: REPAIR QUADRICEP TENDON;  Surgeon: Dannielle Huh, MD;  Location: [facility] OR;  Service: Orthopedics;  Laterality: Left;   SHOULDER ARTHROSCOPY Bilateral    SPINAL CORD STIMULATOR INSERTION N/A 09/23/2015   Procedure: LUMBAR SPINAL CORD STIMULATOR INSERTION;  Surgeon: Odette Fraction, MD;  Location: [facility];  Service: Neurosurgery;  Laterality: N/A;  LUMBAR SPINAL CORD STIMULATOR INSERTION   SPINAL CORD STIMULATOR INSERTION N/A 04/25/2022   Procedure: REVISION OF  SPINAL CORD STIMULATOR;  Surgeon: Tressie Stalker, MD;  Location: [facility] OR;  Service: Neurosurgery;  Laterality: N/A;   TOTAL KNEE ARTHROPLASTY Left 06/17/2017   TOTAL KNEE ARTHROPLASTY Left 06/17/2017   Procedure: TOTAL KNEE ARTHROPLASTY;  Surgeon: Dannielle Huh, MD;  Location: [facility] OR;  Service: Orthopedics;   Laterality: Left;   TOTAL KNEE ARTHROPLASTY Right 12/16/2017   Procedure: TOTAL KNEE ARTHROPLASTY;  Surgeon: Dannielle Huh, MD;  Location: [facility] OR;  Service: Orthopedics;  Laterality: Right;    Current Medications: Outpatient Medications Prior to Visit  Medication Sig Dispense Refill   alfuzosin (UROXATRAL) 10 MG 24 hr tablet Take 1 tablet by mouth daily with breakfast.     ALPRAZolam (XANAX) 1 MG tablet Take 1 mg by mouth at bedtime.  0   amiodarone (PACERONE) 200 MG tablet Take 1 tablet (200 mg total) by mouth daily. 90 tablet 1   apixaban (ELIQUIS) 5 MG TABS tablet Take 1 tablet (5 mg total) by mouth 2 (two) times daily. *Restart 04/28/2022* (Patient taking differently: Take 5 mg by mouth 2 (two) times daily.) 60 tablet    ascorbic acid (VITAMIN C) 500 MG tablet Take  500 mg by mouth daily.     aspirin EC 81 MG tablet Take 1 tablet (81 mg total) by mouth daily. 90 tablet 3   atorvastatin (LIPITOR) 80 MG tablet TAKE 1 TABLET BY MOUTH EVERY DAY 90 tablet 2   Calcium Polycarbophil (FIBER-CAPS PO) Take 2 capsules by mouth daily.     Cholecalciferol (VITAMIN D3) 10 MCG (400 UNIT) CAPS Take 400 Units by mouth daily.     Cyanocobalamin (B-12 PO) Take 1 tablet by mouth daily.     docusate sodium (COLACE) 250 MG capsule Take 250 mg by mouth daily.     finasteride (PROSCAR) 5 MG tablet TAKE 1 TABLET (5 MG TOTAL) BY MOUTH DAILY. 90 tablet 3   furosemide (LASIX) 20 MG tablet Take 1 tablet (20 mg total) by mouth daily. 90 tablet 3   gabapentin (NEURONTIN) 400 MG capsule One three times a day (Patient taking differently: Take 400 mg by mouth 4 (four) times daily.)     isosorbide mononitrate (IMDUR) 60 MG 24 hr tablet Take 60 mg by mouth daily.     nitroGLYCERIN (NITROSTAT) 0.4 MG SL tablet Place 1 tablet (0.4 mg total) under the tongue every 5 (five) minutes as needed for chest pain. 25 tablet 5   Omega-3 1000 MG CAPS Take 1,000 mg by mouth daily.     pantoprazole (PROTONIX) 40 MG tablet TAKE 1 TABLET BY  MOUTH TWICE A DAY (30 TO 60 MINUTES BEFORE 1ST AND LAST MEALS OF THE DAY) (Patient taking differently: 40 mg 2 (two) times daily. (30 TO 60 MINUTES BEFORE 1ST AND LAST MEALS OF THE DAY)) 180 tablet 0   potassium chloride (KLOR-CON) 10 MEQ tablet Take 10 mEq by mouth daily.     silodosin (RAPAFLO) 8 MG CAPS capsule Take 1 capsule (8 mg total) by mouth at bedtime. 30 capsule 11   Simethicone 180 MG CAPS Take 1 capsule by mouth 3 (three) times daily.     traMADol (ULTRAM) 50 MG tablet Take 1-2 tablets (50-100 mg total) by mouth every 4 (four) hours as needed. 48 tablet 0   vitamin E 200 UNIT capsule Take 200 Units by mouth daily.     Zinc Sulfate (ZINC 15 PO) Take 15 mg by mouth daily.     No facility-administered medications prior to visit.     Allergies:   Tylenol [acetaminophen], Morphine and related, and Propofol   Social History   Socioeconomic History   Marital status: Divorced    Spouse name: Not on file   Number of children: 3   Years of education: Not on file   Highest education level: Not on file  Occupational History   Not on file  Tobacco Use   Smoking status: Former    Packs/day: 1.00    Years: 30.00    Additional pack years: 0.00    Total pack years: 30.00    Types: Cigarettes    Quit date: 01/14/1987    Years since quitting: 36.2   Smokeless tobacco: Former    Types: Chew   Tobacco comments:    quit smoking  & chewing by 1987  Vaping Use   Vaping Use: Never used  Substance and Sexual Activity   Alcohol use: Yes    Comment: 2-3 beers per night   Drug use: No   Sexual activity: Not Currently  Other Topics Concern   Not on file  Social History Narrative   Not on file   Social Determinants of Health  Financial Resource Strain: Not on file  Food Insecurity: Not on file  Transportation Needs: Not on file  Physical Activity: Not on file  Stress: Not on file  Social Connections: Not on file     Family History:  The patient's family history is not on file.    Review of Systems:    Please see the history of present illness.     All other systems reviewed and are otherwise negative except as noted above.   Physical Exam:    VS:  There were no vitals taken for this visit.    Affect appropriate Healthy:  appears stated age HEENT: normal Neck supple with no adenopathy JVP normal no bruits no thyromegaly Lungs clear with no wheezing and good diaphragmatic motion Heart:  S1/S2 no murmur, no rub, gallop or click PMI normal post CABG sternotomy  Abdomen: benighn, BS positve, no tenderness, no AAA no bruit.  No HSM or HJR Distal pulses intact with no bruits No edema Neuro non-focal Skin warm and dry No muscular weakness   Wt Readings from Last 3 Encounters:  03/11/23 154 lb 6.4 oz (70 kg)  11/27/22 157 lb 9.6 oz (71.5 kg)  10/22/22 158 lb 12.8 oz (72 kg)     Studies/Labs Reviewed:   EKG: 08/21/22 afib QT 553 ***  Recent Labs: 08/23/2022: Hemoglobin 10.3; Magnesium 1.8; Platelets 157 08/24/2022: ALT 45; BUN <5; Creatinine, Ser 0.72; Potassium 3.7; Sodium 139 02/28/2023: TSH 4.940   Lipid Panel    Component Value Date/Time   CHOL 120 12/31/2020 2351   TRIG 45 12/31/2020 2351   HDL 53 12/31/2020 2351   CHOLHDL 2.3 12/31/2020 2351   VLDL 9 12/31/2020 2351   LDLCALC 58 12/31/2020 2351    Additional studies/ records that were reviewed today include:   Echocardiogram: 08/20/2022 IMPRESSIONS     1. Left ventricular ejection fraction, by estimation, is 55 to 60%. The  left ventricle has normal function. The left ventricle has no regional  wall motion abnormalities. Left ventricular diastolic parameters are  consistent with Grade II diastolic  dysfunction (pseudonormalization). Elevated left atrial pressure.   2. Right ventricular systolic function is normal. The right ventricular  size is mildly enlarged. There is mildly elevated pulmonary artery  systolic pressure.   3. Left atrial size was moderately dilated.   4. Right  atrial size was moderately dilated.   5. The mitral valve is normal in structure. No evidence of mitral valve  regurgitation. No evidence of mitral stenosis.   6. The aortic valve has an indeterminant number of cusps. Aortic valve  regurgitation is not visualized. No aortic stenosis is present.   7. The inferior vena cava is dilated in size with <50% respiratory  variability, suggesting right atrial pressure of 15 mmHg.     Plan:   In order of problems listed above:  1. CAD - He is s/p CABG in 09/2019 with SVG-PDA-OM and LIMA-LAD with LAA excision.No angina *** - Continue current medical therapy for now with ASA 81 mg daily and Atorvastatin 80 mg daily. He has not been on beta-blocker therapy due to baseline bradycardia.    2. Paroxysmal Atrial Fibrillation - He denies any recent palpitations and is in normal sinus rhythm by examination today. He remains on Amiodarone 200 mg daily. PFT's earlier this year showed possible emphysema but no ILD. TSH 4.9 02/28/23  - No recurrent hematuria. He has resumed taking Eliquis 5 mg twice daily and denies any evidence of active bleeding. LAA  clipped at time of CABG -***   3. HLD - Followed by his PCP.  Remains on Atorvastatin 80mg  daily.   4. Lower Extremity Edema - His symptoms have been well-controlled with Lasix 20mg  daily. His creatinine was stable at 0.72 by most recent labs in 08/2022.  5. OSA:  intolerant to CPAP   6. Thyroid:  thyroiditis ? From amiodarone Seen by Dr Fransico Him TSH only mildy elevated on recent labs Ok to continue low dose amiodarone    Medication Adjustments/Labs and Tests Ordered: Current medicines are reviewed at length with the patient today.  Concerns regarding medicines are outlined above.  Medication changes, Labs and Tests ordered today are listed in the Patient Instructions below. There are no Patient Instructions on file for this visit.   Signed, Charlton Haws, MD  03/25/2023 10:54 AM    [facility]  [facility] [address] [place], [place] [address] Phone: [phone] Fax: [phone]

## 2023-03-29 ENCOUNTER — Ambulatory Visit (INDEPENDENT_AMBULATORY_CARE_PROVIDER_SITE_OTHER): Payer: Medicare Other | Admitting: Urology

## 2023-03-29 DIAGNOSIS — N390 Urinary tract infection, site not specified: Secondary | ICD-10-CM

## 2023-03-29 DIAGNOSIS — R82998 Other abnormal findings in urine: Secondary | ICD-10-CM | POA: Diagnosis not present

## 2023-03-29 LAB — URINALYSIS, ROUTINE W REFLEX MICROSCOPIC
Bilirubin, UA: NEGATIVE
Glucose, UA: NEGATIVE
Ketones, UA: NEGATIVE
Nitrite, UA: POSITIVE — AB
Protein,UA: NEGATIVE
RBC, UA: NEGATIVE
Specific Gravity, UA: 1.02 (ref 1.005–1.030)
Urobilinogen, Ur: 0.2 mg/dL (ref 0.2–1.0)
pH, UA: 6 (ref 5.0–7.5)

## 2023-03-29 LAB — MICROSCOPIC EXAMINATION

## 2023-03-29 MED ORDER — SULFAMETHOXAZOLE-TRIMETHOPRIM 800-160 MG PO TABS: 1.0000 | ORAL_TABLET | Freq: Two times a day (BID) | ORAL | 0 refills | Status: DC

## 2023-03-29 NOTE — Progress Notes (Addendum)
Patient presents today with complaints of  UTI and smelly urine.  UA and Culture done today.  Dr. Ronne Binning reviewed results and Bactrim BID X 7 days.  Patient aware of MD recommendations and that we will reach out with culture results.      [id number], CMA

## 2023-04-02 LAB — URINE CULTURE

## 2023-04-08 ENCOUNTER — Ambulatory Visit: Payer: Medicare Other | Attending: Cardiovascular Disease | Admitting: Cardiovascular Disease

## 2023-04-09 ENCOUNTER — Encounter: Payer: Self-pay | Admitting: Cardiovascular Disease

## 2023-04-11 ENCOUNTER — Other Ambulatory Visit: Payer: Self-pay | Admitting: Urology

## 2023-04-11 ENCOUNTER — Ambulatory Visit
Admission: EM | Admit: 2023-04-11 | Discharge: 2023-04-11 | Disposition: A | Discharge status: Home / Self Care | Payer: Medicare Other | Attending: Family Medicine | Admitting: Family Medicine

## 2023-04-11 DIAGNOSIS — S61214A Laceration without foreign body of right ring finger without damage to nail, initial encounter: Secondary | ICD-10-CM | POA: Diagnosis not present

## 2023-04-11 NOTE — ED Notes (Signed)
Applied dressing to patients right ring finger where stitches had been applied. Pt understood to not get sutures wet for 2 days.

## 2023-04-11 NOTE — ED Triage Notes (Signed)
Pt was digging a hole at his mailbox today and hit the mailbox with his right ring finger. Is on blood thinners but was able to get the bleeding to stop.

## 2023-04-11 NOTE — ED Provider Notes (Addendum)
[facility]   [id number] 04/11/23 Arrival Time: 1610  ASSESSMENT & PLAN:  1. Laceration of right ring finger without foreign body without damage to nail, initial encounter     Procedure: Laceration Repair Verbal consent obtained. Patient provided with risks and alternatives to the procedure. Wound copiously irrigated with NS then cleansed with betadine. Local anesthesia: Lidocaine 1% without epinephrine. Wound carefully explored. No foreign body, tendon injury, or nonviable tissue were noted. Using sterile technique, 5 interrupted 4-0 Ethilon sutures were placed to reapproximate the wound. Procedure tolerated well. No complications. Minimal bleeding. Advised to look for and return for any signs of infection such as redness, swelling, discharge, or worsening pain. Return for suture removal in 7 days.  OTC Tylenol if needed. Reviewed expectations re: course of current medical issues. Questions answered. Outlined signs and symptoms indicating need for more acute intervention. Patient verbalized understanding. After Visit Summary given.   SUBJECTIVE:  Christopher [name] is a 77 y.o. male who presents with a laceration of his R 4th finger. Hit on mailbox post today. Bleeding controlled. No extremity sensation changes or weakness. Minimal pain.  Td UTD: feels so.  OBJECTIVE:  Vitals:   04/11/23 1652  BP: 110/65  Pulse: (!) 51  Resp: 18  Temp: 97.8 F (36.6 C)  TempSrc: Oral  SpO2: 93%    General appearance: alert; no distress RUE: curved laceration of proximal R 4th finger; size: approx 1.5 cm; clean wound edges, no foreign bodies; with mild bleeding; normal capillary refill and distal sensation Psychological: alert and cooperative; normal mood and affect  Allergies  Allergen Reactions   Tylenol [Acetaminophen] Other (See Comments)    Stomach pain    Morphine And Related Nausea And Vomiting   Propofol Nausea And Vomiting    Past Medical History:  Diagnosis Date    A-fib (HCC)    Arthritis    "all over my body" (06/17/2017)   BPH (benign prostatic hyperplasia)    takes Proscar daily   CAD (coronary artery disease)    a. s/p CABG in 09/2019 with SVG-PDA-OM and LIMA-LAD with LAA excision   Chronic back pain    DDD; "upper and lower" (06/17/2017)   Constipation    takes Colace daily   Coronary artery disease    DDD (degenerative disc disease), cervical    DVT (deep venous thrombosis) (HCC)    left peroneal vein 07/2017 (in setting of recent left TKA)   Dysrhythmia    history of Atrial fibrillation, no long has this   Enlarged prostate    GERD (gastroesophageal reflux disease)    takes Nexium daily   H/O hiatal hernia    History of colon polyps    benign   History of gastric ulcer    Insomnia    takes Xanax nightly   Joint capsule tear    left knee   Joint pain    Joint swelling    Macular degeneration    Muscle spasm of back    takes Valium daily as needed   PAF (paroxysmal atrial fibrillation) (HCC)    Peripheral neuropathy    takes Gabapentin daily   PONV (postoperative nausea and vomiting)    Pre-diabetes    Sleep apnea    pt does not wear a cpap   Urinary hesitancy    Weakness    numbness and tingling   Social History   Socioeconomic History   Marital status: Divorced    Spouse name: Not on file  Number of children: 3   Years of education: Not on file   Highest education level: Not on file  Occupational History   Not on file  Tobacco Use   Smoking status: Former    Packs/day: 1.00    Years: 30.00    Additional pack years: 0.00    Total pack years: 30.00    Types: Cigarettes    Quit date: 01/14/1987    Years since quitting: 36.2   Smokeless tobacco: Former    Types: Chew   Tobacco comments:    quit smoking  & chewing by 1987  Vaping Use   Vaping Use: Never used  Substance and Sexual Activity   Alcohol use: Yes    Comment: 2-3 beers per night   Drug use: No   Sexual activity: Not Currently  Other Topics  Concern   Not on file  Social History Narrative   Not on file   Social Determinants of Health   Financial Resource Strain: Not on file  Food Insecurity: Not on file  Transportation Needs: Not on file  Physical Activity: Not on file  Stress: Not on file  Social Connections: Not on file          Mardella Layman, MD 04/11/23 Roanna Epley, MD 04/11/23 [phone]

## 2023-04-29 DIAGNOSIS — Z08 Encounter for follow-up examination after completed treatment for malignant neoplasm: Secondary | ICD-10-CM | POA: Diagnosis not present

## 2023-04-29 DIAGNOSIS — Z85828 Personal history of other malignant neoplasm of skin: Secondary | ICD-10-CM | POA: Diagnosis not present

## 2023-05-02 ENCOUNTER — Ambulatory Visit (INDEPENDENT_AMBULATORY_CARE_PROVIDER_SITE_OTHER): Payer: Medicare Other

## 2023-05-02 ENCOUNTER — Telehealth: Payer: Self-pay

## 2023-05-02 DIAGNOSIS — Z09 Encounter for follow-up examination after completed treatment for conditions other than malignant neoplasm: Secondary | ICD-10-CM | POA: Diagnosis not present

## 2023-05-02 DIAGNOSIS — Z87440 Personal history of urinary (tract) infections: Secondary | ICD-10-CM

## 2023-05-02 DIAGNOSIS — N390 Urinary tract infection, site not specified: Secondary | ICD-10-CM

## 2023-05-02 NOTE — Addendum Note (Signed)
Addended by: Christoper Fabian R on: 05/02/2023 02:37 PM   Modules accepted: Orders

## 2023-05-02 NOTE — Telephone Encounter (Signed)
Left detailed message making patient aware of Dr Annabell Howells recommendation from his urine analysis.

## 2023-05-02 NOTE — Progress Notes (Signed)
Patient presents today with complaints of  Follow up from antibiotic.  UA and Culture done today.  Dr. Annabell Howells reviewed results and no treatment  .  Patient aware of MD recommendations and that we will reach out with culture results.      Myla Mauriello,CMA

## 2023-05-03 LAB — URINALYSIS, ROUTINE W REFLEX MICROSCOPIC
Bilirubin, UA: NEGATIVE
Glucose, UA: NEGATIVE
Ketones, UA: NEGATIVE
Nitrite, UA: NEGATIVE
Protein,UA: NEGATIVE
RBC, UA: NEGATIVE
Specific Gravity, UA: 1.015 (ref 1.005–1.030)
Urobilinogen, Ur: 0.2 mg/dL (ref 0.2–1.0)
pH, UA: 7 (ref 5.0–7.5)

## 2023-05-03 LAB — MICROSCOPIC EXAMINATION: Bacteria, UA: NONE SEEN

## 2023-05-30 ENCOUNTER — Encounter: Payer: Self-pay | Admitting: Internal Medicine

## 2023-05-30 ENCOUNTER — Ambulatory Visit: Payer: Medicare Other | Attending: Internal Medicine | Admitting: Internal Medicine

## 2023-05-30 ENCOUNTER — Other Ambulatory Visit: Payer: Self-pay

## 2023-05-30 VITALS — BP 92/60 | HR 53 | Ht 68.0 in | Wt 157.0 lb

## 2023-05-30 DIAGNOSIS — I480 Paroxysmal atrial fibrillation: Secondary | ICD-10-CM | POA: Diagnosis not present

## 2023-05-30 NOTE — Patient Instructions (Signed)
Medication Instructions:  Your physician recommends that you continue on your current medications as directed. Please refer to the Current Medication list given to you today.  *If you need a refill on your cardiac medications before your next appointment, please call your pharmacy*   Lab Work: NONE   If you have labs (blood work) drawn today and your tests are completely normal, you will receive your results only by: MyChart Message (if you have MyChart) OR A paper copy in the mail If you have any lab test that is abnormal or we need to change your treatment, we will call you to review the results.   Testing/Procedures: NONE    Follow-Up: At [facility], you and your health needs are our priority.  As part of our continuing mission to provide you with exceptional heart care, we have created designated Provider Care Teams.  These Care Teams include your primary Cardiologist (physician) and Advanced Practice Providers (APPs -  Physician Assistants and Nurse Practitioners) who all work together to provide you with the care you need, when you need it.  We recommend signing up for the patient portal called "MyChart".  Sign up information is provided on this After Visit Summary.  MyChart is used to connect with patients for Virtual Visits (Telemedicine).  Patients are able to view lab/test results, encounter notes, upcoming appointments, etc.  Non-urgent messages can be sent to your provider as well.   To learn more about what you can do with MyChart, go to [email or web address].    Your next appointment:   1 year(s)  Provider:   Gregg Taylor, MD    Other Instructions Thank you for choosing [facility]!    

## 2023-05-30 NOTE — Progress Notes (Signed)
HPI [name] returns for followup. He is a pleasant 77 yo man with CAD, s/p CABG, HTN, PAF, on amiodarone. He has not had syncope or palpitations. He was bradycardic. No chest pain. No edema. He is s/p spinal stimulator. He has non-exertional chest pain.His back pain is better with the spinal stimulator. Allergies  Allergen Reactions   Tylenol [Acetaminophen] Other (See Comments)    Stomach pain    Morphine And Codeine Nausea And Vomiting   Propofol Nausea And Vomiting     Current Outpatient Medications  Medication Sig Dispense Refill   alfuzosin (UROXATRAL) 10 MG 24 hr tablet Take 1 tablet by mouth daily with breakfast.     ALPRAZolam (XANAX) 1 MG tablet Take 1 mg by mouth at bedtime.  0   amiodarone (PACERONE) 200 MG tablet Take 1 tablet (200 mg total) by mouth daily. 90 tablet 1   apixaban (ELIQUIS) 5 MG TABS tablet Take 1 tablet (5 mg total) by mouth 2 (two) times daily. *Restart 04/28/2022* (Patient taking differently: Take 5 mg by mouth 2 (two) times daily.) 60 tablet    ascorbic acid (VITAMIN C) 500 MG tablet Take 500 mg by mouth daily.     aspirin EC 81 MG tablet Take 1 tablet (81 mg total) by mouth daily. 90 tablet 3   atorvastatin (LIPITOR) 80 MG tablet TAKE 1 TABLET BY MOUTH EVERY DAY 90 tablet 2   Calcium Polycarbophil (FIBER-CAPS PO) Take 2 capsules by mouth daily.     Cholecalciferol (VITAMIN D3) 10 MCG (400 UNIT) CAPS Take 400 Units by mouth daily.     Cyanocobalamin (B-12 PO) Take 1 tablet by mouth daily.     docusate sodium (COLACE) 250 MG capsule Take 250 mg by mouth daily.     finasteride (PROSCAR) 5 MG tablet TAKE 1 TABLET (5 MG TOTAL) BY MOUTH DAILY. 90 tablet 3   furosemide (LASIX) 20 MG tablet Take 1 tablet (20 mg total) by mouth daily. 90 tablet 3   gabapentin (NEURONTIN) 400 MG capsule One three times a day (Patient taking differently: Take 400 mg by mouth 4 (four) times daily.)     isosorbide mononitrate (IMDUR) 60 MG 24 hr tablet Take 60 mg by mouth  daily.     nitroGLYCERIN (NITROSTAT) 0.4 MG SL tablet Place 1 tablet (0.4 mg total) under the tongue every 5 (five) minutes as needed for chest pain. 25 tablet 5   Omega-3 1000 MG CAPS Take 1,000 mg by mouth daily.     pantoprazole (PROTONIX) 40 MG tablet TAKE 1 TABLET BY MOUTH TWICE A DAY (30 TO 60 MINUTES BEFORE 1ST AND LAST MEALS OF THE DAY) (Patient taking differently: 40 mg 2 (two) times daily. (30 TO 60 MINUTES BEFORE 1ST AND LAST MEALS OF THE DAY)) 180 tablet 0   potassium chloride (KLOR-CON) 10 MEQ tablet Take 10 mEq by mouth daily.     silodosin (RAPAFLO) 8 MG CAPS capsule TAKE 1 CAPSULE (8 MG TOTAL) BY MOUTH AT BEDTIME. 90 capsule 3   Simethicone 180 MG CAPS Take 1 capsule by mouth 3 (three) times daily.     traMADol (ULTRAM) 50 MG tablet Take 1-2 tablets (50-100 mg total) by mouth every 4 (four) hours as needed. 48 tablet 0   vitamin E 200 UNIT capsule Take 200 Units by mouth daily.     Zinc Sulfate (ZINC 15 PO) Take 15 mg by mouth daily.     No current facility-administered medications for this visit.  Past Medical History:  Diagnosis Date   A-fib [facility])    Arthritis    "all over my body" (06/17/2017)   BPH (benign prostatic hyperplasia)    takes Proscar daily   CAD (coronary artery disease)    a. s/p CABG in 09/2019 with SVG-PDA-OM and LIMA-LAD with LAA excision   Chronic back pain    DDD; "upper and lower" (06/17/2017)   Constipation    takes Colace daily   Coronary artery disease    DDD (degenerative disc disease), cervical    DVT (deep venous thrombosis) (HCC)    left peroneal vein 07/2017 (in setting of recent left TKA)   Dysrhythmia    history of Atrial fibrillation, no long has this   Enlarged prostate    GERD (gastroesophageal reflux disease)    takes Nexium daily   H/O hiatal hernia    History of colon polyps    benign   History of gastric ulcer    Insomnia    takes Xanax nightly   Joint capsule tear    left knee   Joint pain    Joint swelling     Macular degeneration    Muscle spasm of back    takes Valium daily as needed   PAF (paroxysmal atrial fibrillation) (HCC)    Peripheral neuropathy    takes Gabapentin daily   PONV (postoperative nausea and vomiting)    Pre-diabetes    Sleep apnea    pt does not wear a cpap   Urinary hesitancy    Weakness    numbness and tingling    ROS:   All systems reviewed and negative except as noted in the HPI.   Past Surgical History:  Procedure Laterality Date   ANTERIOR CERVICAL DECOMP/DISCECTOMY FUSION  ~ 1991   "took bone from hip"   ANTERIOR CERVICAL DECOMP/DISCECTOMY FUSION  X 2   "put plate in"   ANTERIOR CERVICAL DISCECTOMY  <11/2005    5-6 and 4-5./notes 05/02/2011   APPENDECTOMY     BACK SURGERY     BIOPSY  05/30/2018   Procedure: BIOPSY;  Surgeon: Malissa Hippo, MD;  Location: [facility];  Service: Endoscopy;;  esophagus   BIOPSY  01/09/2022   Procedure: BIOPSY;  Surgeon: Dolores Frame, MD;  Location: [facility];  Service: Gastroenterology;;   CARDIAC CATHETERIZATION     3-4 per pt   CARPAL TUNNEL RELEASE Bilateral 10/2005 - 11/2005   right-left /notes 05/02/2011   CATARACT EXTRACTION W/PHACO Right 05/24/2014   Procedure: CATARACT EXTRACTION PHACO AND INTRAOCULAR LENS PLACEMENT (IOC);  Surgeon: Gemma Payor, MD;  Location: [facility];  Service: Ophthalmology;  Laterality: Right;  CDE 6.74   CATARACT EXTRACTION W/PHACO Left 06/17/2014   Procedure: CATARACT EXTRACTION PHACO AND INTRAOCULAR LENS PLACEMENT (IOC);  Surgeon: Gemma Payor, MD;  Location: [facility];  Service: Ophthalmology;  Laterality: Left;  CDE:4.65   COLONOSCOPY WITH ESOPHAGOGASTRODUODENOSCOPY (EGD) N/A 01/22/2013   Procedure: COLONOSCOPY WITH ESOPHAGOGASTRODUODENOSCOPY (EGD);  Surgeon: Malissa Hippo, MD;  Location: [facility];  Service: Endoscopy;  Laterality: N/A;  140   COLONOSCOPY WITH PROPOFOL N/A 05/30/2018   diverticulosis and external hemorrhoids   CORONARY ARTERY BYPASS GRAFT  2020    CYSTOSCOPY WITH INSERTION OF UROLIFT N/A 04/16/2022   Procedure: CYSTOSCOPY WITH INSERTION OF UROLIFT;  Surgeon: Malen Gauze, MD;  Location: [facility];  Service: Urology;  Laterality: N/A;   ESOPHAGOGASTRODUODENOSCOPY (EGD) WITH PROPOFOL N/A 05/30/2018   normal esophagus, presence of possible barretts (  bx consistent with reflux changes, GAVE)   ESOPHAGOGASTRODUODENOSCOPY (EGD) WITH PROPOFOL N/A 01/09/2022   Procedure: ESOPHAGOGASTRODUODENOSCOPY (EGD) WITH PROPOFOL;  Surgeon: Dolores Frame, MD;  [facility]: [facility];  Service: Gastroenterology;  Laterality: N/A;  305, moved up per Leigh Amm   EYE SURGERY Bilateral    "cleaned film w/laser; since my 1st cataract OR"   JOINT REPLACEMENT     LEFT HEART CATH AND CORONARY ANGIOGRAPHY N/A 02/16/2019   Procedure: LEFT HEART CATH AND CORONARY ANGIOGRAPHY;  Surgeon: Lyn Records, MD;  Location: [facility];  Service: Cardiovascular;  Laterality: N/A;   POSTERIOR LUMBAR FUSION     "put a plate in"   QUADRICEPS TENDON REPAIR Left 07/01/2017   QUADRICEPS TENDON REPAIR Left 07/01/2017   Procedure: REPAIR QUADRICEP TENDON;  Surgeon: Dannielle Huh, MD;  Location: [facility];  Service: Orthopedics;  Laterality: Left;   SHOULDER ARTHROSCOPY Bilateral    SPINAL CORD STIMULATOR INSERTION N/A 09/23/2015   Procedure: LUMBAR SPINAL CORD STIMULATOR INSERTION;  Surgeon: Odette Fraction, MD;  Location: [facility];  Service: Neurosurgery;  Laterality: N/A;  LUMBAR SPINAL CORD STIMULATOR INSERTION   SPINAL CORD STIMULATOR INSERTION N/A 04/25/2022   Procedure: REVISION OF  SPINAL CORD STIMULATOR;  Surgeon: Tressie Stalker, MD;  [facility];  Service: Neurosurgery;  Laterality: N/A;   TOTAL KNEE ARTHROPLASTY Left 06/17/2017   TOTAL KNEE ARTHROPLASTY Left 06/17/2017   Procedure: TOTAL KNEE ARTHROPLASTY;  Surgeon: Dannielle Huh, MD;  Location: [facility];  Service: Orthopedics;  Laterality: Left;   TOTAL KNEE ARTHROPLASTY Right 12/16/2017   Procedure:  TOTAL KNEE ARTHROPLASTY;  Surgeon: Dannielle Huh, MD;  Location: [facility];  Service: Orthopedics;  Laterality: Right;     History reviewed. No pertinent family history.   Social History   Socioeconomic History   Marital status: Divorced    Spouse name: Not on file   Number of children: 3   Years of education: Not on file   Highest education level: Not on file  Occupational History   Not on file  Tobacco Use   Smoking status: Former    Packs/day: 1.00    Years: 30.00    Additional pack years: 0.00    Total pack years: 30.00    Types: Cigarettes    Quit date: 01/14/1987    Years since quitting: 36.3   Smokeless tobacco: Former    Types: Chew   Tobacco comments:    quit smoking  & chewing by 1987  Vaping Use   Vaping Use: Never used  Substance and Sexual Activity   Alcohol use: Yes    Comment: 2-3 beers per night   Drug use: No   Sexual activity: Not Currently  Other Topics Concern   Not on file  Social History Narrative   Not on file   Social Determinants of Health   Financial Resource Strain: Not on file  Food Insecurity: Not on file  Transportation Needs: Not on file  Physical Activity: Not on file  Stress: Not on file  Social Connections: Not on file  Intimate Partner Violence: Not on file     BP 92/60   Pulse (!) 53   Ht 5\' 8"  (1.727 m)   Wt 157 lb (71.2 kg)   SpO2 93%   BMI 23.87 kg/m   Physical Exam:  Well appearing NAD HEENT: Unremarkable Neck:  No JVD, no thyromegally Lymphatics:  No adenopathy Back:  No CVA tenderness Lungs:  Clear HEART:  Regular rate rhythm,  no murmurs, no rubs, no clicks Abd:  soft, positive bowel sounds, no organomegally, no rebound, no guarding Ext:  2 plus pulses, no edema, no cyanosis, no clubbing Skin:  No rashes no nodules Neuro:  CN II through XII intact, motor grossly intact  EKG - NSR with spinal stimulator  DEVICE  Normal device function.  See PaceArt for details.   Assess/Plan: 1. PAF - he is  maintaining NSR. He will continue his amiodarone. 2. CAD - he denies anginal symptoms. We will follow. His chest pain is non-cardiac 3. HTN - His bp is on the low side but better at home.   Christopher Gowda Jered Heiny,MD

## 2023-06-18 DIAGNOSIS — I482 Chronic atrial fibrillation, unspecified: Secondary | ICD-10-CM | POA: Diagnosis not present

## 2023-06-18 DIAGNOSIS — I25119 Atherosclerotic heart disease of native coronary artery with unspecified angina pectoris: Secondary | ICD-10-CM | POA: Diagnosis not present

## 2023-06-18 DIAGNOSIS — R7309 Other abnormal glucose: Secondary | ICD-10-CM | POA: Diagnosis not present

## 2023-06-18 DIAGNOSIS — R946 Abnormal results of thyroid function studies: Secondary | ICD-10-CM | POA: Diagnosis not present

## 2023-06-18 DIAGNOSIS — G250 Essential tremor: Secondary | ICD-10-CM | POA: Diagnosis not present

## 2023-06-18 DIAGNOSIS — D649 Anemia, unspecified: Secondary | ICD-10-CM | POA: Diagnosis not present

## 2023-06-18 DIAGNOSIS — Z6824 Body mass index (BMI) 24.0-24.9, adult: Secondary | ICD-10-CM | POA: Diagnosis not present

## 2023-07-16 ENCOUNTER — Other Ambulatory Visit: Payer: Self-pay | Admitting: Student

## 2023-07-16 DIAGNOSIS — R413 Other amnesia: Secondary | ICD-10-CM

## 2023-07-16 DIAGNOSIS — M542 Cervicalgia: Secondary | ICD-10-CM | POA: Diagnosis not present

## 2023-07-16 DIAGNOSIS — R2681 Unsteadiness on feet: Secondary | ICD-10-CM | POA: Diagnosis not present

## 2023-08-13 ENCOUNTER — Ambulatory Visit
Admission: RE | Admit: 2023-08-13 | Discharge: 2023-08-13 | Disposition: A | Discharge status: Home / Self Care | Payer: Medicare Other | Source: Ambulatory Visit | Attending: Student | Admitting: Student

## 2023-08-13 DIAGNOSIS — Z981 Arthrodesis status: Secondary | ICD-10-CM | POA: Diagnosis not present

## 2023-08-13 DIAGNOSIS — R296 Repeated falls: Secondary | ICD-10-CM | POA: Diagnosis not present

## 2023-08-13 DIAGNOSIS — M542 Cervicalgia: Secondary | ICD-10-CM

## 2023-08-13 DIAGNOSIS — R413 Other amnesia: Secondary | ICD-10-CM | POA: Diagnosis not present

## 2023-09-08 ENCOUNTER — Other Ambulatory Visit: Payer: Self-pay | Admitting: Internal Medicine

## 2023-09-10 DIAGNOSIS — E0590 Thyrotoxicosis, unspecified without thyrotoxic crisis or storm: Secondary | ICD-10-CM | POA: Diagnosis not present

## 2023-09-10 DIAGNOSIS — R7989 Other specified abnormal findings of blood chemistry: Secondary | ICD-10-CM | POA: Diagnosis not present

## 2023-09-11 ENCOUNTER — Ambulatory Visit: Payer: Medicare Other | Admitting: "Endocrinology

## 2023-09-11 ENCOUNTER — Encounter: Payer: Self-pay | Admitting: "Endocrinology

## 2023-09-11 VITALS — BP 126/66 | HR 52 | Ht 68.0 in | Wt 161.6 lb

## 2023-09-11 DIAGNOSIS — R7989 Other specified abnormal findings of blood chemistry: Secondary | ICD-10-CM | POA: Diagnosis not present

## 2023-09-11 LAB — T4, FREE: Free T4: 1.64 ng/dL (ref 0.82–1.77)

## 2023-09-11 LAB — T3, FREE: T3, Free: 2.4 pg/mL (ref 2.0–4.4)

## 2023-09-11 LAB — TSH: TSH: 2.46 u[IU]/mL (ref 0.450–4.500)

## 2023-09-11 NOTE — Progress Notes (Signed)
Endocrinology follow-up note                                             09/11/2023, 12:41 PM   Subjective:    Patient ID: Christopher [name], male    DOB: 1946/05/23, PCP Assunta Found, MD   Past Medical History:  Diagnosis Date   A-fib [facility])    Arthritis    "all over my body" (06/17/2017)   BPH (benign prostatic hyperplasia)    takes Proscar daily   CAD (coronary artery disease)    a. s/p CABG in 09/2019 with SVG-PDA-OM and LIMA-LAD with LAA excision   Chronic back pain    DDD; "upper and lower" (06/17/2017)   Constipation    takes Colace daily   Coronary artery disease    DDD (degenerative disc disease), cervical    DVT (deep venous thrombosis) (HCC)    left peroneal vein 07/2017 (in setting of recent left TKA)   Dysrhythmia    history of Atrial fibrillation, no long has this   Enlarged prostate    GERD (gastroesophageal reflux disease)    takes Nexium daily   H/O hiatal hernia    History of colon polyps    benign   History of gastric ulcer    Insomnia    takes Xanax nightly   Joint capsule tear    left knee   Joint pain    Joint swelling    Macular degeneration    Muscle spasm of back    takes Valium daily as needed   PAF (paroxysmal atrial fibrillation) (HCC)    Peripheral neuropathy    takes Gabapentin daily   PONV (postoperative nausea and vomiting)    Pre-diabetes    Sleep apnea    pt does not wear a cpap   Urinary hesitancy    Weakness    numbness and tingling   Past Surgical History:  Procedure Laterality Date   ANTERIOR CERVICAL DECOMP/DISCECTOMY FUSION  ~ 1991   "took bone from hip"   ANTERIOR CERVICAL DECOMP/DISCECTOMY FUSION  X 2   "put plate in"   ANTERIOR CERVICAL DISCECTOMY  <11/2005    5-6 and 4-5./notes 05/02/2011   APPENDECTOMY     BACK SURGERY     BIOPSY  05/30/2018   Procedure: BIOPSY;  Surgeon: Malissa Hippo, MD;  [facility]: [facility];  Service: Endoscopy;;  esophagus   BIOPSY  01/09/2022   Procedure: BIOPSY;  Surgeon:  Dolores Frame, MD;  Location: [facility];  Service: Gastroenterology;;   CARDIAC CATHETERIZATION     3-4 per pt   CARPAL TUNNEL RELEASE Bilateral 10/2005 - 11/2005   right-left /notes 05/02/2011   CATARACT EXTRACTION W/PHACO Right 05/24/2014   Procedure: CATARACT EXTRACTION PHACO AND INTRAOCULAR LENS PLACEMENT (IOC);  Surgeon: Gemma Payor, MD;  Location: [facility];  Service: Ophthalmology;  Laterality: Right;  CDE 6.74   CATARACT EXTRACTION W/PHACO Left 06/17/2014   Procedure: CATARACT EXTRACTION PHACO AND INTRAOCULAR LENS PLACEMENT (IOC);  Surgeon: Gemma Payor, MD;  Location: [facility];  Service: Ophthalmology;  Laterality: Left;  CDE:4.65   COLONOSCOPY WITH ESOPHAGOGASTRODUODENOSCOPY (EGD) N/A 01/22/2013   Procedure: COLONOSCOPY WITH ESOPHAGOGASTRODUODENOSCOPY (EGD);  Surgeon: Malissa Hippo, MD;  [facility]: [facility];  Service: Endoscopy;  Laterality: N/A;  140   COLONOSCOPY WITH PROPOFOL N/A 05/30/2018  diverticulosis and external hemorrhoids   CORONARY ARTERY BYPASS GRAFT  2020   CYSTOSCOPY WITH INSERTION OF UROLIFT N/A 04/16/2022   Procedure: CYSTOSCOPY WITH INSERTION OF UROLIFT;  Surgeon: Malen Gauze, MD;  Location: [facility];  Service: Urology;  Laterality: N/A;   ESOPHAGOGASTRODUODENOSCOPY (EGD) WITH PROPOFOL N/A 05/30/2018   normal esophagus, presence of possible barretts (bx consistent with reflux changes, GAVE)   ESOPHAGOGASTRODUODENOSCOPY (EGD) WITH PROPOFOL N/A 01/09/2022   Procedure: ESOPHAGOGASTRODUODENOSCOPY (EGD) WITH PROPOFOL;  Surgeon: Dolores Frame, MD;  [facility]: [facility];  Service: Gastroenterology;  Laterality: N/A;  305, moved up per Leigh Amm   EYE SURGERY Bilateral    "cleaned film w/laser; since my 1st cataract OR"   JOINT REPLACEMENT     LEFT HEART CATH AND CORONARY ANGIOGRAPHY N/A 02/16/2019   Procedure: LEFT HEART CATH AND CORONARY ANGIOGRAPHY;  Surgeon: Lyn Records, MD;  [facility]: [facility];  Service: Cardiovascular;   Laterality: N/A;   POSTERIOR LUMBAR FUSION     "put a plate in"   QUADRICEPS TENDON REPAIR Left 07/01/2017   QUADRICEPS TENDON REPAIR Left 07/01/2017   Procedure: REPAIR QUADRICEP TENDON;  Surgeon: Dannielle Huh, MD;  Location: [facility];  Service: Orthopedics;  Laterality: Left;   SHOULDER ARTHROSCOPY Bilateral    SPINAL CORD STIMULATOR INSERTION N/A 09/23/2015   Procedure: LUMBAR SPINAL CORD STIMULATOR INSERTION;  Surgeon: Odette Fraction, MD;  Location: [facility];  Service: Neurosurgery;  Laterality: N/A;  LUMBAR SPINAL CORD STIMULATOR INSERTION   SPINAL CORD STIMULATOR INSERTION N/A 04/25/2022   Procedure: REVISION OF  SPINAL CORD STIMULATOR;  Surgeon: Tressie Stalker, MD;  Location: [facility] OR;  Service: Neurosurgery;  Laterality: N/A;   TOTAL KNEE ARTHROPLASTY Left 06/17/2017   TOTAL KNEE ARTHROPLASTY Left 06/17/2017   Procedure: TOTAL KNEE ARTHROPLASTY;  Surgeon: Dannielle Huh, MD;  Location: [facility];  Service: Orthopedics;  Laterality: Left;   TOTAL KNEE ARTHROPLASTY Right 12/16/2017   Procedure: TOTAL KNEE ARTHROPLASTY;  Surgeon: Dannielle Huh, MD;  Location: [facility];  Service: Orthopedics;  Laterality: Right;   Social History   Socioeconomic History   Marital status: Divorced    Spouse name: Not on file   Number of children: 3   Years of education: Not on file   Highest education level: Not on file  Occupational History   Not on file  Tobacco Use   Smoking status: Former    Current packs/day: 0.00    Average packs/day: 1 pack/day for 30.0 years (30.0 ttl pk-yrs)    Types: Cigarettes    Start date: 01/14/1957    Quit date: 01/14/1987    Years since quitting: 36.6   Smokeless tobacco: Former    Types: Chew   Tobacco comments:    quit smoking  & chewing by 1987  Vaping Use   Vaping status: Never Used  Substance and Sexual Activity   Alcohol use: Yes    Comment: 2-3 beers per night   Drug use: No   Sexual activity: Not Currently  Other Topics Concern   Not on file  Social History  Narrative   Not on file   Social Determinants of Health   Financial Resource Strain: Low Risk  (02/27/2023)   Received from [facility], [facility]   Overall Financial Resource Strain (CARDIA)    Difficulty of Paying Living Expenses: Not hard at all  Food Insecurity: No Food Insecurity (02/27/2023)   Received from [facility], [facility]   Hunger Vital Sign  Worried About Programme researcher, broadcasting/film/video in the Last Year: Never true    Ran Out of Food in the Last Year: Never true  Transportation Needs: No Transportation Needs (02/27/2023)   Received from [employer], [facility]   PRAPARE - Transportation    Lack of Transportation (Medical): No    Lack of Transportation (Non-Medical): No  Physical Activity: Not on file  Stress: Not on file  Social Connections: Unknown (09/24/2022)   Received from [facility]   Social Network    Social Network: Not on file   History reviewed. No pertinent family history. Outpatient Encounter Medications as of 09/11/2023  Medication Sig   alfuzosin (UROXATRAL) 10 MG 24 hr tablet Take 1 tablet by mouth daily with breakfast.   ALPRAZolam (XANAX) 1 MG tablet Take 1 mg by mouth at bedtime.   amiodarone (PACERONE) 200 MG tablet TAKE 1 TABLET BY MOUTH EVERY DAY   apixaban (ELIQUIS) 5 MG TABS tablet Take 1 tablet (5 mg total) by mouth 2 (two) times daily. *Restart 04/28/2022* (Patient taking differently: Take 5 mg by mouth 2 (two) times daily.)   ascorbic acid (VITAMIN C) 500 MG tablet Take 500 mg by mouth daily.   aspirin EC 81 MG tablet Take 1 tablet (81 mg total) by mouth daily.   atorvastatin (LIPITOR) 80 MG tablet TAKE 1 TABLET BY MOUTH EVERY DAY   Calcium Polycarbophil (FIBER-CAPS PO) Take 2 capsules by mouth daily.   Cholecalciferol (VITAMIN D3) 10 MCG (400 UNIT) CAPS Take 400 Units by mouth daily.   Cyanocobalamin (B-12 PO) Take 1 tablet by mouth daily.   docusate sodium (COLACE) 250 MG capsule Take 250 mg by mouth daily.    finasteride (PROSCAR) 5 MG tablet TAKE 1 TABLET (5 MG TOTAL) BY MOUTH DAILY.   furosemide (LASIX) 20 MG tablet Take 1 tablet (20 mg total) by mouth daily.   gabapentin (NEURONTIN) 400 MG capsule One three times a day (Patient taking differently: Take 400 mg by mouth 4 (four) times daily.)   isosorbide mononitrate (IMDUR) 60 MG 24 hr tablet Take 60 mg by mouth daily.   nitroGLYCERIN (NITROSTAT) 0.4 MG SL tablet Place 1 tablet (0.4 mg total) under the tongue every 5 (five) minutes as needed for chest pain.   Omega-3 1000 MG CAPS Take 1,000 mg by mouth daily.   pantoprazole (PROTONIX) 40 MG tablet TAKE 1 TABLET BY MOUTH TWICE A DAY (30 TO 60 MINUTES BEFORE 1ST AND LAST MEALS OF THE DAY) (Patient taking differently: 40 mg 2 (two) times daily. (30 TO 60 MINUTES BEFORE 1ST AND LAST MEALS OF THE DAY))   potassium chloride (KLOR-CON) 10 MEQ tablet Take 10 mEq by mouth daily.   silodosin (RAPAFLO) 8 MG CAPS capsule TAKE 1 CAPSULE (8 MG TOTAL) BY MOUTH AT BEDTIME.   Simethicone 180 MG CAPS Take 1 capsule by mouth 3 (three) times daily.   traMADol (ULTRAM) 50 MG tablet Take 1-2 tablets (50-100 mg total) by mouth every 4 (four) hours as needed.   vitamin E 200 UNIT capsule Take 200 Units by mouth daily.   Zinc Sulfate (ZINC 15 PO) Take 15 mg by mouth daily.   No facility-administered encounter medications on file as of 09/11/2023.   ALLERGIES: Allergies  Allergen Reactions   Tylenol [Acetaminophen] Other (See Comments)    Stomach pain    Morphine And Codeine Nausea And Vomiting   Propofol Nausea And Vomiting    VACCINATION STATUS: Immunization History  Administered Date(s) Administered  Fluad Quad(high Dose 65+) 12/19/2020   Influenza Inj Mdck Quad Pf 11/05/2018   Influenza, High Dose Seasonal PF 10/09/2019   Influenza,inj,quad, With Preservative 12/20/2018   Pneumococcal Conjugate-13 10/09/2019   Tdap 07/22/2019    HPI ZAMARIAN KWAK is 77 y.o. male who presents today with a medical history  as above. he is being seen in follow up after he was seen in consultation for hyperthyroidism requested by Assunta Found, MD.   Patient is not optimal historian, and hard of hearing.  His previous workup included thyroid uptake and scan which showed diminished uptake consistent with thyroiditis.  Subsequent thyroid function test were consistent with euthyroid presentation, he is not on any antithyroid medication nor thyroid hormone supplement.     He remains on amiodarone for cardiac dysrhythmia.  He presents with minimally fluctuating body weight.    He is taking amiodarone 200 mg p.o. daily from his cardiologist.  He denies any family history of thyroid dysfunction or thyroid malignancy. He is not on polypharmacy. He has multiple medical problems including coronary artery disease, hypertension, hyperlipidemia. Review of Systems  Constitutional: no recent weight gain/loss, no fatigue, no subjective hyperthermia, no subjective hypothermia Eyes: no blurry vision, no xerophthalmia ENT: no sore throat, no nodules palpated in throat, no dysphagia/odynophagia, no hoarseness   Objective:       09/11/2023    9:00 AM 05/30/2023    1:03 PM 04/11/2023    4:52 PM  Vitals with BMI  Height 5\' 8"  5\' 8"    Weight 161 lbs 10 oz 157 lbs   BMI 24.58 23.88   Systolic 126 92 110  Diastolic 66 60 65  Pulse 52 53 51    BP 126/66   Pulse (!) 52   Ht 5\' 8"  (1.727 m)   Wt 161 lb 9.6 oz (73.3 kg)   BMI 24.57 kg/m   Wt Readings from Last 3 Encounters:  09/11/23 161 lb 9.6 oz (73.3 kg)  05/30/23 157 lb (71.2 kg)  03/11/23 154 lb 6.4 oz (70 kg)    Physical Exam  Constitutional:  Body mass index is 24.57 kg/m.,  not in acute distress, normal state of mind Eyes: PERRLA, EOMI, no exophthalmos ENT: moist mucous membranes, no gross thyromegaly, no gross cervical lymphadenopathy   CMP ( most recent) CMP     Component Value Date/Time   NA 139 08/24/2022 0444   NA 145 (H) 02/11/2019 1255   K 3.7  08/24/2022 0444   CL 111 08/24/2022 0444   CO2 24 08/24/2022 0444   GLUCOSE 97 08/24/2022 0444   BUN <5 (L) 08/24/2022 0444   BUN 10 02/11/2019 1255   CREATININE 0.72 08/24/2022 0444   CALCIUM 8.4 (L) 08/24/2022 0444   PROT 5.2 (L) 08/24/2022 0444   ALBUMIN 2.4 (L) 08/24/2022 0444   AST 34 08/24/2022 0444   ALT 45 (H) 08/24/2022 0444   ALKPHOS 82 08/24/2022 0444   BILITOT 1.0 08/24/2022 0444   GFRNONAA >60 08/24/2022 0444   GFRAA 101 02/11/2019 1255     Diabetic Labs (most recent): Lab Results  Component Value Date   HGBA1C 5.6 12/31/2020     Lipid Panel ( most recent) Lipid Panel     Component Value Date/Time   CHOL 120 12/31/2020 2351   TRIG 45 12/31/2020 2351   HDL 53 12/31/2020 2351   CHOLHDL 2.3 12/31/2020 2351   VLDL 9 12/31/2020 2351   LDLCALC 58 12/31/2020 2351      Lab Results  Component  Value Date   TSH 2.460 09/10/2023   TSH 4.940 (H) 02/28/2023   TSH 2.010 01/09/2022   TSH 2.136 10/20/2020   TSH 2.40 08/23/2020   TSH 1.93 12/15/2018   TSH 2.035 03/05/2018   TSH 2.287 11/04/2017   TSH 2.656 07/22/2017   FREET4 1.64 09/10/2023   FREET4 1.50 02/28/2023   FREET4 0.83 03/06/2018   FREET4 0.88 11/04/2017        FINDINGS: Very low uptake within thyroid gland by imaging. Thyroid gland shape is faintly discern.   4 hour I-123 uptake = 1.2% (normal 5-20%), 24 hour I-123 uptake = 1.4% (normal 10-30%)   IMPRESSION: Decreased iodine uptake by imaging and direct measurement. Coupled with depressed TSH, findings suggestive of subacute thyroiditis. Recommend correlation   Assessment & Plan:   1. Subacute thyroiditis 2. Hearing loss, unspecified hearing loss type, unspecified laterality   - I have reviewed his available thyroid records and clinically evaluated the patient. 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   He  will not need intervention at this time.  He remains on amiodarone.  He will return in a year with thyroid function tests for reevaluation.   Encouraged to continue his amiodarone until assessment by his cardiologist.  - I did not initiate any new prescriptions today. - he is advised to maintain close follow up with Assunta Found, MD for primary care needs.   I spent  22  minutes in the care of the patient today including review of labs from Thyroid Function, CMP, and other relevant labs ; imaging/biopsy records (current and previous including abstractions from other facilities); face-to-face time discussing  his lab results and symptoms, medications doses, his options of short and long term treatment based on the latest standards of care / guidelines;   and documenting the encounter.  Christopher [name]  participated in the discussions, expressed understanding, and voiced agreement with the above plans.  All questions were answered to his satisfaction. he is encouraged to contact clinic should he have any questions or concerns prior to his return visit.  Follow up plan: Return in about 1 year (around 09/10/2024) for F/U with Pre-visit Labs.   Marquis Lunch, MD [facility] [address] [place], [place] [address] Phone: [phone]  Fax: [phone]     09/11/2023, 12:41 PM  This note was partially dictated with voice recognition software. Similar sounding words can be transcribed inadequately or may not  be corrected upon review.

## 2023-09-23 ENCOUNTER — Other Ambulatory Visit: Payer: Self-pay | Admitting: Urology

## 2023-10-04 DIAGNOSIS — M542 Cervicalgia: Secondary | ICD-10-CM | POA: Diagnosis not present

## 2023-10-04 DIAGNOSIS — Z981 Arthrodesis status: Secondary | ICD-10-CM | POA: Diagnosis not present

## 2023-10-15 ENCOUNTER — Other Ambulatory Visit: Payer: Self-pay | Admitting: Student

## 2023-10-30 DIAGNOSIS — R946 Abnormal results of thyroid function studies: Secondary | ICD-10-CM | POA: Diagnosis not present

## 2023-10-30 DIAGNOSIS — Z6824 Body mass index (BMI) 24.0-24.9, adult: Secondary | ICD-10-CM | POA: Diagnosis not present

## 2023-10-30 DIAGNOSIS — I25119 Atherosclerotic heart disease of native coronary artery with unspecified angina pectoris: Secondary | ICD-10-CM | POA: Diagnosis not present

## 2023-10-30 DIAGNOSIS — R300 Dysuria: Secondary | ICD-10-CM | POA: Diagnosis not present

## 2023-10-30 DIAGNOSIS — N390 Urinary tract infection, site not specified: Secondary | ICD-10-CM | POA: Diagnosis not present

## 2023-10-30 DIAGNOSIS — R748 Abnormal levels of other serum enzymes: Secondary | ICD-10-CM | POA: Diagnosis not present

## 2023-10-30 DIAGNOSIS — Z0001 Encounter for general adult medical examination with abnormal findings: Secondary | ICD-10-CM | POA: Diagnosis not present

## 2023-10-30 NOTE — Progress Notes (Signed)
Name: Christopher [name] DOB: 1945/12/19 MRN: [id number]  History of Present Illness: Christopher [name] is a 77 y.o. male who presents today for return visit at [facility]. - GU history: 1. BPH with LUTS & BOO. - 08/01/2022: Underwent UroLift procedure by Christopher [name].  - Taking Rapaflo and Proscar.  2. Prior episode(s) of urinary retention. 3. Prior recurrent UTIs.  4. Prior episode of urosepsis (2 weeks s/p UroLift procedure).  At last visit with Christopher [name] on 08/31/2022: - PVR = 18 ml. - Advised to follow up in 6 weeks for repeat PVR.  Since last visit: > 03/29/2023: Urine culture positive for >100k E. Coli. Treated with Bactrim by Christopher [name].  Today: He reports left low back pain for 3-4 days. Denies any known precipitating events - denies recent falls, trauma, or strenuous physical activity. He denies abdominal pain, nausea, vomiting, weakness, fatigue, or feeling confused. He denies fevers but reports always feeling cold (chronically). Denies rectal / perineal pain or blood in stool.  He denies any acute urinary symptoms - denies increased urinary urgency, frequency, nocturia, dysuria, gross hematuria, hesitancy, straining to void, or sensations of incomplete emptying. Reports chronic terminal dribbling despite doing double voiding. Reports malodorous urine for >6 months.  States "I don't drink water" - states he drinks 1 cup of coffee and about 4 oz of tea in the morning, then doesn't drink anything until about 5 pm then drinks 24-36 oz of lemonade.   He denies history of kidney stones.   Relevant imaging:  CT abdomen/pelvis w/ contrast on 08/19/2022 showed no acute GU findings - no GU stones, masses, or hydronephrosis.  Fall Screening: Do you usually have a device to assist in your mobility? No   Medications: Current Outpatient Medications  Medication Sig Dispense Refill   ALPRAZolam (XANAX) 1 MG tablet Take 1 mg by mouth at bedtime.  0   amiodarone (PACERONE)  200 MG tablet TAKE 1 TABLET BY MOUTH EVERY DAY 90 tablet 3   apixaban (ELIQUIS) 5 MG TABS tablet Take 1 tablet (5 mg total) by mouth 2 (two) times daily. *Restart 04/28/2022* (Patient taking differently: Take 5 mg by mouth 2 (two) times daily.) 60 tablet    ascorbic acid (VITAMIN C) 500 MG tablet Take 500 mg by mouth daily.     aspirin EC 81 MG tablet Take 1 tablet (81 mg total) by mouth daily. 90 tablet 3   atorvastatin (LIPITOR) 80 MG tablet TAKE 1 TABLET BY MOUTH EVERY DAY 90 tablet 2   Calcium Polycarbophil (FIBER-CAPS PO) Take 2 capsules by mouth daily.     Cholecalciferol (VITAMIN D3) 10 MCG (400 UNIT) CAPS Take 400 Units by mouth daily.     Cyanocobalamin (B-12 PO) Take 1 tablet by mouth daily.     docusate sodium (COLACE) 250 MG capsule Take 250 mg by mouth daily.     finasteride (PROSCAR) 5 MG tablet TAKE 1 TABLET (5 MG TOTAL) BY MOUTH DAILY. 90 tablet 3   furosemide (LASIX) 20 MG tablet Take 1 tablet (20 mg total) by mouth daily. 90 tablet 3   gabapentin (NEURONTIN) 400 MG capsule One three times a day (Patient taking differently: Take 400 mg by mouth 4 (four) times daily.)     isosorbide mononitrate (IMDUR) 60 MG 24 hr tablet Take 60 mg by mouth daily.     nitroGLYCERIN (NITROSTAT) 0.4 MG SL tablet Place 1 tablet (0.4 mg total) under the tongue every 5 (five) minutes as needed  for chest pain. 25 tablet 5   Omega-3 1000 MG CAPS Take 1,000 mg by mouth daily.     pantoprazole (PROTONIX) 40 MG tablet TAKE 1 TABLET BY MOUTH TWICE A DAY (30 TO 60 MINUTES BEFORE 1ST AND LAST MEALS OF THE DAY) (Patient taking differently: 40 mg 2 (two) times daily. (30 TO 60 MINUTES BEFORE 1ST AND LAST MEALS OF THE DAY)) 180 tablet 0   potassium chloride (KLOR-CON) 10 MEQ tablet Take 10 mEq by mouth daily.     silodosin (RAPAFLO) 8 MG CAPS capsule TAKE 1 CAPSULE (8 MG TOTAL) BY MOUTH AT BEDTIME. 90 capsule 3   Simethicone 180 MG CAPS Take 1 capsule by mouth 3 (three) times daily.      sulfamethoxazole-trimethoprim (BACTRIM DS) 800-160 MG tablet Take 1 tablet by mouth every 12 (twelve) hours. 14 tablet 0   traMADol (ULTRAM) 50 MG tablet Take 1-2 tablets (50-100 mg total) by mouth every 4 (four) hours as needed. 48 tablet 0   vitamin E 200 UNIT capsule Take 200 Units by mouth daily.     Zinc Sulfate (ZINC 15 PO) Take 15 mg by mouth daily.     No current facility-administered medications for this visit.    Allergies: Allergies  Allergen Reactions   Tylenol [Acetaminophen] Other (See Comments)    Stomach pain    Morphine And Codeine Nausea And Vomiting   Propofol Nausea And Vomiting    Past Medical History:  Diagnosis Date   A-fib (HCC)    Arthritis    "all over my body" (06/17/2017)   BPH (benign prostatic hyperplasia)    takes Proscar daily   CAD (coronary artery disease)    a. s/p CABG in 09/2019 with SVG-PDA-OM and LIMA-LAD with LAA excision   Chronic back pain    DDD; "upper and lower" (06/17/2017)   Constipation    takes Colace daily   Coronary artery disease    DDD (degenerative disc disease), cervical    DVT (deep venous thrombosis) (HCC)    left peroneal vein 07/2017 (in setting of recent left TKA)   Dysrhythmia    history of Atrial fibrillation, no long has this   Enlarged prostate    GERD (gastroesophageal reflux disease)    takes Nexium daily   H/O hiatal hernia    History of colon polyps    benign   History of gastric ulcer    Insomnia    takes Xanax nightly   Joint capsule tear    left knee   Joint pain    Joint swelling    Macular degeneration    Muscle spasm of back    takes Valium daily as needed   PAF (paroxysmal atrial fibrillation) (HCC)    Peripheral neuropathy    takes Gabapentin daily   PONV (postoperative nausea and vomiting)    Pre-diabetes    Sleep apnea    pt does not wear a cpap   Urinary hesitancy    Weakness    numbness and tingling   Past Surgical History:  Procedure Laterality Date   ANTERIOR CERVICAL  DECOMP/DISCECTOMY FUSION  ~ 1991   "took bone from hip"   ANTERIOR CERVICAL DECOMP/DISCECTOMY FUSION  X 2   "put plate in"   ANTERIOR CERVICAL DISCECTOMY  <11/2005    5-6 and 4-5./notes 05/02/2011   APPENDECTOMY     BACK SURGERY     BIOPSY  05/30/2018   Procedure: BIOPSY;  Surgeon: Malissa Hippo, MD;  Location: [facility];  Service: Endoscopy;;  esophagus   BIOPSY  01/09/2022   Procedure: BIOPSY;  Surgeon: Dolores Frame, MD;  Location: [facility];  Service: Gastroenterology;;   CARDIAC CATHETERIZATION     3-4 per pt   CARPAL TUNNEL RELEASE Bilateral 10/2005 - 11/2005   right-left /notes 05/02/2011   CATARACT EXTRACTION W/PHACO Right 05/24/2014   Procedure: CATARACT EXTRACTION PHACO AND INTRAOCULAR LENS PLACEMENT (IOC);  Surgeon: Gemma Payor, MD;  Location: [facility];  Service: Ophthalmology;  Laterality: Right;  CDE 6.74   CATARACT EXTRACTION W/PHACO Left 06/17/2014   Procedure: CATARACT EXTRACTION PHACO AND INTRAOCULAR LENS PLACEMENT (IOC);  Surgeon: Gemma Payor, MD;  Location: [facility];  Service: Ophthalmology;  Laterality: Left;  CDE:4.65   COLONOSCOPY WITH ESOPHAGOGASTRODUODENOSCOPY (EGD) N/A 01/22/2013   Procedure: COLONOSCOPY WITH ESOPHAGOGASTRODUODENOSCOPY (EGD);  Surgeon: Malissa Hippo, MD;  [facility]: [facility];  Service: Endoscopy;  Laterality: N/A;  140   COLONOSCOPY WITH PROPOFOL N/A 05/30/2018   diverticulosis and external hemorrhoids   CORONARY ARTERY BYPASS GRAFT  2020   CYSTOSCOPY WITH INSERTION OF UROLIFT N/A 04/16/2022   Procedure: CYSTOSCOPY WITH INSERTION OF UROLIFT;  Surgeon: Malen Gauze, MD;  Location: [facility];  Service: Urology;  Laterality: N/A;   ESOPHAGOGASTRODUODENOSCOPY (EGD) WITH PROPOFOL N/A 05/30/2018   normal esophagus, presence of possible barretts (bx consistent with reflux changes, GAVE)   ESOPHAGOGASTRODUODENOSCOPY (EGD) WITH PROPOFOL N/A 01/09/2022   Procedure: ESOPHAGOGASTRODUODENOSCOPY (EGD) WITH PROPOFOL;  Surgeon: Dolores Frame, MD;  [facility];  Service: Gastroenterology;  Laterality: N/A;  305, moved up per Leigh Amm   EYE SURGERY Bilateral    "cleaned film w/laser; since my 1st cataract OR"   JOINT REPLACEMENT     LEFT HEART CATH AND CORONARY ANGIOGRAPHY N/A 02/16/2019   Procedure: LEFT HEART CATH AND CORONARY ANGIOGRAPHY;  Surgeon: Lyn Records, MD;  Location: [facility];  Service: Cardiovascular;  Laterality: N/A;   POSTERIOR LUMBAR FUSION     "put a plate in"   QUADRICEPS TENDON REPAIR Left 07/01/2017   QUADRICEPS TENDON REPAIR Left 07/01/2017   Procedure: REPAIR QUADRICEP TENDON;  Surgeon: Dannielle Huh, MD;  Location: [facility];  Service: Orthopedics;  Laterality: Left;   SHOULDER ARTHROSCOPY Bilateral    SPINAL CORD STIMULATOR INSERTION N/A 09/23/2015   Procedure: LUMBAR SPINAL CORD STIMULATOR INSERTION;  Surgeon: Odette Fraction, MD;  Location: [facility];  Service: Neurosurgery;  Laterality: N/A;  LUMBAR SPINAL CORD STIMULATOR INSERTION   SPINAL CORD STIMULATOR INSERTION N/A 04/25/2022   Procedure: REVISION OF  SPINAL CORD STIMULATOR;  Surgeon: Tressie Stalker, MD;  Location: [facility] OR;  Service: Neurosurgery;  Laterality: N/A;   TOTAL KNEE ARTHROPLASTY Left 06/17/2017   TOTAL KNEE ARTHROPLASTY Left 06/17/2017   Procedure: TOTAL KNEE ARTHROPLASTY;  Surgeon: Dannielle Huh, MD;  Location: [facility];  Service: Orthopedics;  Laterality: Left;   TOTAL KNEE ARTHROPLASTY Right 12/16/2017   Procedure: TOTAL KNEE ARTHROPLASTY;  Surgeon: Dannielle Huh, MD;  Location: [facility];  Service: Orthopedics;  Laterality: Right;   History reviewed. No pertinent family history. Social History   Socioeconomic History   Marital status: Divorced    Spouse name: Not on file   Number of children: 3   Years of education: Not on file   Highest education level: Not on file  Occupational History   Not on file  Tobacco Use   Smoking status: Former    Current packs/day: 0.00    Average packs/day: 1  pack/day  for 30.0 years (30.0 ttl pk-yrs)    Types: Cigarettes    Start date: 01/14/1957    Quit date: 01/14/1987    Years since quitting: 36.8   Smokeless tobacco: Former    Types: Chew   Tobacco comments:    quit smoking  & chewing by 1987  Vaping Use   Vaping status: Never Used  Substance and Sexual Activity   Alcohol use: Yes    Comment: 2-3 beers per night   Drug use: No   Sexual activity: Not Currently  Other Topics Concern   Not on file  Social History Narrative   Not on file   Social Determinants of Health   Financial Resource Strain: Low Risk  (02/27/2023)   Received from [facility], [facility]   Overall Financial Resource Strain (CARDIA)    Difficulty of Paying Living Expenses: Not hard at all  Food Insecurity: No Food Insecurity (02/27/2023)   Received from [facility], [facility]   Hunger Vital Sign    Worried About Running Out of Food in the Last Year: Never true    Ran Out of Food in the Last Year: Never true  Transportation Needs: No Transportation Needs (02/27/2023)   Received from [facility], [facility]   PRAPARE - Transportation    Lack of Transportation (Medical): No    Lack of Transportation (Non-Medical): No  Physical Activity: Not on file  Stress: Not on file  Social Connections: Unknown (09/24/2022)   Received from [facility], [facility]   Social Network    Social Network: Not on file  Intimate Partner Violence: Unknown (09/24/2022)   Received from [facility], [facility]   HITS    Physically Hurt: Not on file    Insult or Talk Down To: Not on file    Threaten Physical Harm: Not on file    Scream or Curse: Not on file    Review of Systems Constitutional: Patient denies any unintentional weight loss or change in strength lntegumentary: Patient denies any rashes or pruritus Cardiovascular: Patient denies chest pain or syncope Respiratory: Patient denies shortness of breath Gastrointestinal: As per HPI Musculoskeletal:  Patient denies muscle cramps or weakness Neurologic: Patient denies convulsions or seizures Allergic/Immunologic: Patient denies recent allergic reaction(s) Hematologic/Lymphatic: Patient denies bleeding tendencies Endocrine: Patient denies heat/cold intolerance  GU: As per HPI.  OBJECTIVE Vitals:   11/01/23 1118  BP: 128/68  Pulse: (!) 54  Temp: 97.6 F (36.4 C)   Body mass index is 24.94 kg/m.  Physical Examination Constitutional: No obvious distress; patient is non-toxic appearing  Cardiovascular: No visible lower extremity edema.  Respiratory: The patient does not have audible wheezing/stridor; respirations do not appear labored  Gastrointestinal: Abdomen non-distended Musculoskeletal: Normal ROM of UEs  Skin: No obvious rashes/open sores  Neurologic: CN 2-12 grossly intact Psychiatric: Answered questions appropriately with normal affect  Hematologic/Lymphatic/Immunologic: No obvious bruises or sites of spontaneous bleeding  UA: >30 WBC/hpf, 3-10 RBC/hpf, bacteria (many) PVR: 25 ml  ASSESSMENT Acute left-sided low back pain without sciatica - Plan: Urinalysis, Routine w reflex microscopic, BLADDER SCAN AMB NON-IMAGING, Urine culture, sulfamethoxazole-trimethoprim (BACTRIM DS) 800-160 MG tablet  Benign prostatic hyperplasia with nocturia - Plan: Urine culture, sulfamethoxazole-trimethoprim (BACTRIM DS) 800-160 MG tablet  Bladder outlet obstruction  Abnormal urinalysis - Plan: Urine culture, sulfamethoxazole-trimethoprim (BACTRIM DS) 800-160 MG tablet  History of sepsis - Plan: Urine culture, sulfamethoxazole-trimethoprim (BACTRIM DS) 800-160 MG tablet  No classic UTI symptoms, however his UA is grossly abnormal so UTI considered  as possible etiology for his atraumatic left low back pain. Agreed to check urine culture and treat empirically with Bactrim while awaiting culture results and sensitivities. If urine culture comes back negative, will plan for KUB to assess for  possible stone.  We discussed that his malodorous urine is likely due to inadequate daily hydration. Encouraged increase fluid intake - primarily water.   Will plan for follow up in 2 weeks for recheck. Pt verbalized understanding and agreement. All questions were answered.  PLAN Advised the following: 1. Urine culture. 2. Bactrim DS 2x/day x7 days. 3. Return in about 2 weeks (around 11/15/2023) for UA, PVR, & f/u with Evette Georges NP.  Orders Placed This Encounter  Procedures   Urine culture   Urinalysis, Routine w reflex microscopic   BLADDER SCAN AMB NON-IMAGING    It has been explained that the patient is to follow regularly with their PCP in addition to all other providers involved in their care and to follow instructions provided by these respective offices. Patient advised to contact urology clinic if any urologic-pertaining questions, concerns, new symptoms or problems arise in the interim period.  There are no Patient Instructions on file for this visit.  Electronically signed by:  Donnita Falls, FNP   11/01/23    12:09 PM

## 2023-11-01 ENCOUNTER — Ambulatory Visit: Payer: Medicare Other | Admitting: Urology

## 2023-11-01 ENCOUNTER — Encounter: Payer: Self-pay | Admitting: Urology

## 2023-11-01 VITALS — BP 128/68 | HR 54 | Temp 97.6°F | Ht 68.0 in | Wt 164.0 lb

## 2023-11-01 DIAGNOSIS — M545 Low back pain, unspecified: Secondary | ICD-10-CM

## 2023-11-01 DIAGNOSIS — Z8619 Personal history of other infectious and parasitic diseases: Secondary | ICD-10-CM

## 2023-11-01 DIAGNOSIS — N401 Enlarged prostate with lower urinary tract symptoms: Secondary | ICD-10-CM | POA: Diagnosis not present

## 2023-11-01 DIAGNOSIS — R109 Unspecified abdominal pain: Secondary | ICD-10-CM

## 2023-11-01 DIAGNOSIS — R351 Nocturia: Secondary | ICD-10-CM

## 2023-11-01 DIAGNOSIS — N320 Bladder-neck obstruction: Secondary | ICD-10-CM

## 2023-11-01 DIAGNOSIS — R8290 Unspecified abnormal findings in urine: Secondary | ICD-10-CM | POA: Diagnosis not present

## 2023-11-01 LAB — URINALYSIS, ROUTINE W REFLEX MICROSCOPIC
Bilirubin, UA: NEGATIVE
Glucose, UA: NEGATIVE
Ketones, UA: NEGATIVE
Nitrite, UA: POSITIVE — AB
Protein,UA: NEGATIVE
RBC, UA: NEGATIVE
Specific Gravity, UA: 1.015 (ref 1.005–1.030)
Urobilinogen, Ur: 0.2 mg/dL (ref 0.2–1.0)
pH, UA: 6 (ref 5.0–7.5)

## 2023-11-01 LAB — MICROSCOPIC EXAMINATION: WBC, UA: >30 /[HPF] — AB (ref 0–5)

## 2023-11-01 MED ORDER — SULFAMETHOXAZOLE-TRIMETHOPRIM 800-160 MG PO TABS: 1.0000 | ORAL_TABLET | Freq: Two times a day (BID) | ORAL | 0 refills | Status: DC

## 2023-11-01 NOTE — Progress Notes (Signed)
post void residual =25

## 2023-11-04 LAB — URINE CULTURE

## 2023-11-12 NOTE — Progress Notes (Signed)
Name: Christopher [name] DOB: [birth year] MRN: [id number]  History of Present Illness: Christopher [name] is a 77 y.o. male who presents today for follow up visit at [facility] [place]. - GU history: 1. BPH with LUTS & BOO. - 08/01/2022: Underwent UroLift procedure by Dr. Ronne Binning.  - Taking Rapaflo and Proscar. 2. Prior episode(s) of urinary retention. 3. Recurrent UTIs.  4. Prior episode of urosepsis (2 weeks s/p UroLift procedure).  Urine culture results in past 12 months: - 03/29/2023: Positive for E. coli - 11/01/2023: Positive for E. coli  At last visit on 11/01/2023: - Reported left low back pain x3-4 days. No acute urinary symptoms. Reported chronic terminal dribbling despite doing double voiding and malodorous urine for >6 months.  - Stated "I don't drink water" - states he drinks 1 cup of coffee and about 4 oz of tea in the morning, then doesn't drink anything until about 5 pm then drinks 24-36 oz of lemonade. - Denies history of kidney stones. - UA: >30 WBC/hpf, 3-10 RBC/hpf, bacteria (many) - PVR: 25 ml - The plan was:  1. Urine culture (came back positive for E. Coli).  2. Bactrim DS 2x/day x7 days. 3. Discussed that his malodorous urine is likely due to inadequate daily hydration. Encouraged increase fluid intake - primarily water.  Today: He reports persistent low back pain which has worsened since last visit and is now bilateral. Improves with heating pad.   He denies fevers, flank pain, or abdominal pain.  He denies acute urinary symptoms - denies increased urinary urgency, frequency, nocturia, dysuria, gross hematuria, hesitancy, straining to void, or sensations of incomplete emptying.   Fall Screening: Do you usually have a device to assist in your mobility? No   Medications: Current Outpatient Medications  Medication Sig Dispense Refill   ALPRAZolam (XANAX) 1 MG tablet Take 1 mg by mouth at bedtime.  0   amiodarone (PACERONE) 200 MG tablet TAKE 1 TABLET BY  MOUTH EVERY DAY 90 tablet 3   apixaban (ELIQUIS) 5 MG TABS tablet Take 1 tablet (5 mg total) by mouth 2 (two) times daily. *Restart 04/28/2022* (Patient taking differently: Take 5 mg by mouth 2 (two) times daily.) 60 tablet    ascorbic acid (VITAMIN C) 500 MG tablet Take 500 mg by mouth daily.     aspirin EC 81 MG tablet Take 1 tablet (81 mg total) by mouth daily. 90 tablet 3   atorvastatin (LIPITOR) 80 MG tablet TAKE 1 TABLET BY MOUTH EVERY DAY 90 tablet 2   Calcium Polycarbophil (FIBER-CAPS PO) Take 2 capsules by mouth daily.     Cholecalciferol (VITAMIN D3) 10 MCG (400 UNIT) CAPS Take 400 Units by mouth daily.     Cyanocobalamin (B-12 PO) Take 1 tablet by mouth daily.     docusate sodium (COLACE) 250 MG capsule Take 250 mg by mouth daily.     finasteride (PROSCAR) 5 MG tablet TAKE 1 TABLET (5 MG TOTAL) BY MOUTH DAILY. 90 tablet 3   furosemide (LASIX) 20 MG tablet Take 1 tablet (20 mg total) by mouth daily. 90 tablet 3   gabapentin (NEURONTIN) 400 MG capsule One three times a day (Patient taking differently: Take 400 mg by mouth 4 (four) times daily.)     isosorbide mononitrate (IMDUR) 60 MG 24 hr tablet Take 60 mg by mouth daily.     nitroGLYCERIN (NITROSTAT) 0.4 MG SL tablet Place 1 tablet (0.4 mg total) under the tongue every 5 (five) minutes as needed for  chest pain. 25 tablet 5   Omega-3 1000 MG CAPS Take 1,000 mg by mouth daily.     pantoprazole (PROTONIX) 40 MG tablet TAKE 1 TABLET BY MOUTH TWICE A DAY (30 TO 60 MINUTES BEFORE 1ST AND LAST MEALS OF THE DAY) (Patient taking differently: 40 mg 2 (two) times daily. (30 TO 60 MINUTES BEFORE 1ST AND LAST MEALS OF THE DAY)) 180 tablet 0   potassium chloride (KLOR-CON) 10 MEQ tablet Take 10 mEq by mouth daily.     silodosin (RAPAFLO) 8 MG CAPS capsule TAKE 1 CAPSULE (8 MG TOTAL) BY MOUTH AT BEDTIME. 90 capsule 3   Simethicone 180 MG CAPS Take 1 capsule by mouth 3 (three) times daily.     sulfamethoxazole-trimethoprim (BACTRIM DS) 800-160 MG  tablet Take 1 tablet by mouth every 12 (twelve) hours. 14 tablet 0   traMADol (ULTRAM) 50 MG tablet Take 1-2 tablets (50-100 mg total) by mouth every 4 (four) hours as needed. 48 tablet 0   vitamin E 200 UNIT capsule Take 200 Units by mouth daily.     Zinc Sulfate (ZINC 15 PO) Take 15 mg by mouth daily.     No current facility-administered medications for this visit.    Allergies: Allergies  Allergen Reactions   Tylenol [Acetaminophen] Other (See Comments)    Stomach pain    Morphine And Codeine Nausea And Vomiting   Propofol Nausea And Vomiting    Past Medical History:  Diagnosis Date   A-fib (HCC)    Arthritis    "all over my body" (06/17/2017)   BPH (benign prostatic hyperplasia)    takes Proscar daily   CAD (coronary artery disease)    a. s/p CABG in 09/2019 with SVG-PDA-OM and LIMA-LAD with LAA excision   Chronic back pain    DDD; "upper and lower" (06/17/2017)   Constipation    takes Colace daily   Coronary artery disease    DDD (degenerative disc disease), cervical    DVT (deep venous thrombosis) (HCC)    left peroneal vein 07/2017 (in setting of recent left TKA)   Dysrhythmia    history of Atrial fibrillation, no long has this   Enlarged prostate    GERD (gastroesophageal reflux disease)    takes Nexium daily   H/O hiatal hernia    History of colon polyps    benign   History of gastric ulcer    Insomnia    takes Xanax nightly   Joint capsule tear    left knee   Joint pain    Joint swelling    Macular degeneration    Muscle spasm of back    takes Valium daily as needed   PAF (paroxysmal atrial fibrillation) (HCC)    Peripheral neuropathy    takes Gabapentin daily   PONV (postoperative nausea and vomiting)    Pre-diabetes    Sleep apnea    pt does not wear a cpap   Urinary hesitancy    Weakness    numbness and tingling   Past Surgical History:  Procedure Laterality Date   ANTERIOR CERVICAL DECOMP/DISCECTOMY FUSION  ~ 1991   "took bone from hip"    ANTERIOR CERVICAL DECOMP/DISCECTOMY FUSION  X 2   "put plate in"   ANTERIOR CERVICAL DISCECTOMY  <11/2005    5-6 and 4-5./notes 05/02/2011   APPENDECTOMY     BACK SURGERY     BIOPSY  05/30/2018   Procedure: BIOPSY;  Surgeon: Malissa Hippo, MD;  [facility]: [facility];  Service: Endoscopy;;  esophagus   BIOPSY  01/09/2022   Procedure: BIOPSY;  Surgeon: Dolores Frame, MD;  [facility]: [facility];  Service: Gastroenterology;;   CARDIAC CATHETERIZATION     3-4 per pt   CARPAL TUNNEL RELEASE Bilateral 10/2005 - 11/2005   right-left /notes 05/02/2011   CATARACT EXTRACTION W/PHACO Right 05/24/2014   Procedure: CATARACT EXTRACTION PHACO AND INTRAOCULAR LENS PLACEMENT (IOC);  Surgeon: Gemma Payor, MD;  Location: [facility];  Service: Ophthalmology;  Laterality: Right;  CDE 6.74   CATARACT EXTRACTION W/PHACO Left 06/17/2014   Procedure: CATARACT EXTRACTION PHACO AND INTRAOCULAR LENS PLACEMENT (IOC);  Surgeon: Gemma Payor, MD;  Location: [facility];  Service: Ophthalmology;  Laterality: Left;  CDE:4.65   COLONOSCOPY WITH ESOPHAGOGASTRODUODENOSCOPY (EGD) N/A 01/22/2013   Procedure: COLONOSCOPY WITH ESOPHAGOGASTRODUODENOSCOPY (EGD);  Surgeon: Malissa Hippo, MD;  [facility]: [facility];  Service: Endoscopy;  Laterality: N/A;  140   COLONOSCOPY WITH PROPOFOL N/A 05/30/2018   diverticulosis and external hemorrhoids   CORONARY ARTERY BYPASS GRAFT  2020   CYSTOSCOPY WITH INSERTION OF UROLIFT N/A 04/16/2022   Procedure: CYSTOSCOPY WITH INSERTION OF UROLIFT;  Surgeon: Malen Gauze, MD;  Location: [facility];  Service: Urology;  Laterality: N/A;   ESOPHAGOGASTRODUODENOSCOPY (EGD) WITH PROPOFOL N/A 05/30/2018   normal esophagus, presence of possible barretts (bx consistent with reflux changes, GAVE)   ESOPHAGOGASTRODUODENOSCOPY (EGD) WITH PROPOFOL N/A 01/09/2022   Procedure: ESOPHAGOGASTRODUODENOSCOPY (EGD) WITH PROPOFOL;  Surgeon: Dolores Frame, MD;  [facility];  Service:  Gastroenterology;  Laterality: N/A;  305, moved up per Leigh Amm   EYE SURGERY Bilateral    "cleaned film w/laser; since my 1st cataract OR"   JOINT REPLACEMENT     LEFT HEART CATH AND CORONARY ANGIOGRAPHY N/A 02/16/2019   Procedure: LEFT HEART CATH AND CORONARY ANGIOGRAPHY;  Surgeon: Lyn Records, MD;  [facility]: [facility];  Service: Cardiovascular;  Laterality: N/A;   POSTERIOR LUMBAR FUSION     "put a plate in"   QUADRICEPS TENDON REPAIR Left 07/01/2017   QUADRICEPS TENDON REPAIR Left 07/01/2017   Procedure: REPAIR QUADRICEP TENDON;  Surgeon: Dannielle Huh, MD;  Location: [facility] OR;  Service: Orthopedics;  Laterality: Left;   SHOULDER ARTHROSCOPY Bilateral    SPINAL CORD STIMULATOR INSERTION N/A 09/23/2015   Procedure: LUMBAR SPINAL CORD STIMULATOR INSERTION;  Surgeon: Odette Fraction, MD;  Location: [facility];  Service: Neurosurgery;  Laterality: N/A;  LUMBAR SPINAL CORD STIMULATOR INSERTION   SPINAL CORD STIMULATOR INSERTION N/A 04/25/2022   Procedure: REVISION OF  SPINAL CORD STIMULATOR;  Surgeon: Tressie Stalker, MD;  Location[facility] OR;  Service: Neurosurgery;  Laterality: N/A;   TOTAL KNEE ARTHROPLASTY Left 06/17/2017   TOTAL KNEE ARTHROPLASTY Left 06/17/2017   Procedure: TOTAL KNEE ARTHROPLASTY;  Surgeon: Dannielle Huh, MD;  Location: [facility];  Service: Orthopedics;  Laterality: Left;   TOTAL KNEE ARTHROPLASTY Right 12/16/2017   Procedure: TOTAL KNEE ARTHROPLASTY;  Surgeon: Dannielle Huh, MD;  Location: [facility];  Service: Orthopedics;  Laterality: Right;   History reviewed. No pertinent family history. Social History   Socioeconomic History   Marital status: Divorced    Spouse name: Not on file   Number of children: 3   Years of education: Not on file   Highest education level: Not on file  Occupational History   Not on file  Tobacco Use   Smoking status: Former    Current packs/day: 0.00    Average packs/day: 1 pack/day for 30.0 years (  30.0 ttl pk-yrs)    Types: Cigarettes     Start date: 01/14/1957    Quit date: 01/14/1987    Years since quitting: 36.8   Smokeless tobacco: Former    Types: Chew   Tobacco comments:    quit smoking  & chewing by 1987  Vaping Use   Vaping status: Never Used  Substance and Sexual Activity   Alcohol use: Yes    Comment: 2-3 beers per night   Drug use: No   Sexual activity: Not Currently  Other Topics Concern   Not on file  Social History Narrative   Not on file   Social Determinants of Health   Financial Resource Strain: Low Risk  (02/27/2023)   Received from [facility], [facility]   Overall Financial Resource Strain (CARDIA)    Difficulty of Paying Living Expenses: Not hard at all  Food Insecurity: No Food Insecurity (02/27/2023)   Received from [facility], [facility]   Hunger Vital Sign    Worried About Running Out of Food in the Last Year: Never true    Ran Out of Food in the Last Year: Never true  Transportation Needs: No Transportation Needs (02/27/2023)   Received from [facility], [facility]   PRAPARE - Transportation    Lack of Transportation (Medical): No    Lack of Transportation (Non-Medical): No  Physical Activity: Not on file  Stress: Not on file  Social Connections: Unknown (09/24/2022)   Received from [facility], [facility]   Social Network    Social Network: Not on file  Intimate Partner Violence: Unknown (09/24/2022)   Received from [facility], [facility]   HITS    Physically Hurt: Not on file    Insult or Talk Down To: Not on file    Threaten Physical Harm: Not on file    Scream or Curse: Not on file    Review of Systems Constitutional: Patient denies any unintentional weight loss or change in strength lntegumentary: Patient denies any rashes or pruritus Cardiovascular: Patient denies chest pain or syncope Respiratory: Patient denies shortness of breath Gastrointestinal: Patient denies nausea, vomiting, constipation, or diarrhea. Reports bloating.   Musculoskeletal: Patient denies muscle cramps or weakness Neurologic: Patient denies convulsions or seizures Allergic/Immunologic: Patient denies recent allergic reaction(s) Hematologic/Lymphatic: Patient denies bleeding tendencies Endocrine: Patient denies heat/cold intolerance  GU: As per HPI.  OBJECTIVE Vitals:   11/18/23 0926  BP: 128/74  Pulse: (!) 53  Temp: 97.9 F (36.6 C)   There is no height or weight on file to calculate BMI.  Physical Examination Constitutional: No obvious distress; patient is non-toxic appearing  Cardiovascular: No visible lower extremity edema.  Respiratory: The patient does not have audible wheezing/stridor; respirations do not appear labored  Gastrointestinal: Abdomen non-distended Musculoskeletal: Normal ROM of UEs  Skin: No obvious rashes/open sores  Neurologic: CN 2-12 grossly intact Psychiatric: Answered questions appropriately with normal affect  Hematologic/Lymphatic/Immunologic: No obvious bruises or sites of spontaneous bleeding  UA: negative  PVR: 27 ml  ASSESSMENT Acute UTI - Plan: Urinalysis, Routine w reflex microscopic, BLADDER SCAN AMB NON-IMAGING  Flank pain - Plan: Urinalysis, Routine w reflex microscopic, BLADDER SCAN AMB NON-IMAGING  Benign prostatic hyperplasia, unspecified whether lower urinary tract symptoms present - Plan: Urinalysis, Routine w reflex microscopic, BLADDER SCAN AMB NON-IMAGING, US RENAL  Recurrent UTI - Plan: US RENAL  Based on symptomatic improvement and normal UA today he was reassured that his UTI has resolved.  For UTI prevention we discussed  options including: Maintain adequate fluid intake daily to flush out the urinary tract. - Go to the bathroom to urinate every 4-6 hours while awake to minimize urinary stasis / bacterial overgrowth in the bladder. - Proanthocyanidin (PAC) supplement 36 mg daily; must be soluble (insoluble form of PAC will be ineffective). Recommend Ellura. D-mannose 2 g  daily Vitamin C supplement  If recurrent UTls persist, may consider UTI prophylaxis with a daily low dose antibiotic in the future. Discussed renal ultrasound and/or cystoscopy for further evaluation due to recurrent UTI in male; he elected to do RUS and will plan for follow up with Dr. Ronne Binning in 3 months or sooner if needed.  We discussed there is no evidence of GU etiology for his bloating or bilateral low back pain; advised to follow up with his PCP for those concerns.  Pt verbalized understanding and agreement. All questions were answered.   PLAN Advised the following: 1. Renal ultrasound.  2. Maintain adequate fluid intake daily to flush out the urinary tract. 3. Urinate every 4-6 hours while awake to minimize urinary stasis / bacterial overgrowth in the bladder. 4. Consider OTC supplements for UTI prevention. 5. Return in about 3 months (around 02/16/2024) for f/u with Dr. Ronne Binning.  Orders Placed This Encounter  Procedures   US RENAL    Standing Status:   Future    Standing Expiration Date:   11/17/2024    Order Specific Question:   Reason for Exam (SYMPTOM  OR DIAGNOSIS REQUIRED)    Answer:   kidney stone known or suspected    Order Specific Question:   Preferred imaging location?    Answer:   [facility]   Urinalysis, Routine w reflex microscopic   BLADDER SCAN AMB NON-IMAGING   It has been explained that the patient is to follow regularly with their PCP in addition to all other providers involved in their care and to follow instructions provided by these respective offices. Patient advised to contact urology clinic if any urologic-pertaining questions, concerns, new symptoms or problems arise in the interim period.  Patient Instructions  Recommendations regarding UTI prevention / management:  Options when UTI symptoms occur: 1. Call [facility] [place] to request urgent / same-day visit (phone # [phone]).  2. Call your Primary Care Provider (PCP)  office to request urgent / same-day visit. Be sure to request for urine culture to be ordered and have results faxed to Urology (fax # [phone]).  3. Go to urgent care. Be sure to request for urine culture to be ordered and have results faxed to Urology (fax # [phone]).   For bladder pain/ burning with urination: - Can take OTC Pyridium (phenazopyridine; commonly known under the "AZO" brand) for a few days as needed. Limit use to no more than 3 days consecutively due to risk for methemoglobinemia, liver function issues, and bone health damage with long term use of Pyridium.  Routine use for UTI prevention: - Adequate daily fluid intake to flush out the urinary tract. - Go to the bathroom to urinate every 4-6 hours while awake to minimize urinary stasis / bacterial overgrowth in the bladder. - Proanthocyanidin (PAC) supplement 36 mg daily; must be soluble (insoluble form of PAC will be ineffective). Recommended brand: Ellura. This is an over-the-counter supplement (often must be found/ purchased online) supplement derived from cranberries with concentrated active component: Proanthocyanidin (PAC) 36 mg daily. Decreases bacterial adherence to bladder lining.  - D-mannose powder (2 grams daily). This is an over-the-counter supplement  which decreases bacterial adherence to bladder lining (it is a sugar that inhibits bacterial adherence to urothelial cells by binding to the pili of enteric bacteria). Take as per manufacturer recommendation. Can be used as an alternative or in addition to the concentrated cranberry supplement.  - Vitamin C supplement to acidify urine to minimize bacterial growth.   Note for patients with diabetes:  - Be aware that D-mannose contains sugar.    Electronically signed by:  Donnita Falls, FNP   11/18/23    9:45 AM

## 2023-11-18 ENCOUNTER — Ambulatory Visit: Payer: Medicare Other | Admitting: Urology

## 2023-11-18 ENCOUNTER — Encounter: Payer: Self-pay | Admitting: Urology

## 2023-11-18 VITALS — BP 128/74 | HR 53 | Temp 97.9°F

## 2023-11-18 DIAGNOSIS — N400 Enlarged prostate without lower urinary tract symptoms: Secondary | ICD-10-CM

## 2023-11-18 DIAGNOSIS — N390 Urinary tract infection, site not specified: Secondary | ICD-10-CM | POA: Diagnosis not present

## 2023-11-18 DIAGNOSIS — Z87440 Personal history of urinary (tract) infections: Secondary | ICD-10-CM

## 2023-11-18 DIAGNOSIS — R109 Unspecified abdominal pain: Secondary | ICD-10-CM | POA: Diagnosis not present

## 2023-11-18 LAB — URINALYSIS, ROUTINE W REFLEX MICROSCOPIC
Bilirubin, UA: NEGATIVE
Glucose, UA: NEGATIVE
Ketones, UA: NEGATIVE
Leukocytes,UA: NEGATIVE
Nitrite, UA: NEGATIVE
Protein,UA: NEGATIVE
RBC, UA: NEGATIVE
Specific Gravity, UA: 1.025 (ref 1.005–1.030)
Urobilinogen, Ur: 0.2 mg/dL (ref 0.2–1.0)
pH, UA: 6.5 (ref 5.0–7.5)

## 2023-11-18 LAB — BLADDER SCAN AMB NON-IMAGING: Scan Result: 27

## 2023-11-18 NOTE — Patient Instructions (Signed)
Recommendations regarding UTI prevention / management:  Options when UTI symptoms occur: 1. Call [facility] [place] to request urgent / same-day visit (phone # [phone]).  2. Call your Primary Care Provider (PCP) office to request urgent / same-day visit. Be sure to request for urine culture to be ordered and have results faxed to Urology (fax # [phone]).  3. Go to urgent care. Be sure to request for urine culture to be ordered and have results faxed to Urology (fax # [phone]).   For bladder pain/ burning with urination: - Can take OTC Pyridium (phenazopyridine; commonly known under the "AZO" brand) for a few days as needed. Limit use to no more than 3 days consecutively due to risk for methemoglobinemia, liver function issues, and bone health damage with long term use of Pyridium.  Routine use for UTI prevention: - Adequate daily fluid intake to flush out the urinary tract. - Go to the bathroom to urinate every 4-6 hours while awake to minimize urinary stasis / bacterial overgrowth in the bladder. - Proanthocyanidin (PAC) supplement 36 mg daily; must be soluble (insoluble form of PAC will be ineffective). Recommended brand: Ellura. This is an over-the-counter supplement (often must be found/ purchased online) supplement derived from cranberries with concentrated active component: Proanthocyanidin (PAC) 36 mg daily. Decreases bacterial adherence to bladder lining.  - D-mannose powder (2 grams daily). This is an over-the-counter supplement which decreases bacterial adherence to bladder lining (it is a sugar that inhibits bacterial adherence to urothelial cells by binding to the pili of enteric bacteria). Take as per manufacturer recommendation. Can be used as an alternative or in addition to the concentrated cranberry supplement.  - Vitamin C supplement to acidify urine to minimize bacterial growth.   Note for patients with diabetes:  - Be aware that D-mannose contains  sugar.

## 2023-11-24 ENCOUNTER — Other Ambulatory Visit: Payer: Self-pay | Admitting: Urology

## 2023-11-24 DIAGNOSIS — R8290 Unspecified abnormal findings in urine: Secondary | ICD-10-CM

## 2023-11-24 DIAGNOSIS — N401 Enlarged prostate with lower urinary tract symptoms: Secondary | ICD-10-CM

## 2023-11-24 DIAGNOSIS — Z8619 Personal history of other infectious and parasitic diseases: Secondary | ICD-10-CM

## 2023-11-24 DIAGNOSIS — M545 Low back pain, unspecified: Secondary | ICD-10-CM

## 2023-11-25 ENCOUNTER — Ambulatory Visit (HOSPITAL_COMMUNITY)
Admission: RE | Admit: 2023-11-25 | Discharge: 2023-11-25 | Disposition: A | Discharge status: Home / Self Care | Payer: Medicare Other | Source: Ambulatory Visit | Attending: Urology | Admitting: Urology

## 2023-11-25 DIAGNOSIS — N400 Enlarged prostate without lower urinary tract symptoms: Secondary | ICD-10-CM | POA: Insufficient documentation

## 2023-11-25 DIAGNOSIS — N390 Urinary tract infection, site not specified: Secondary | ICD-10-CM | POA: Diagnosis not present

## 2023-11-25 DIAGNOSIS — N200 Calculus of kidney: Secondary | ICD-10-CM | POA: Diagnosis not present

## 2023-12-22 ENCOUNTER — Ambulatory Visit (HOSPITAL_COMMUNITY)
Admission: RE | Admit: 2023-12-22 | Discharge: 2023-12-22 | Disposition: A | Discharge status: Home / Self Care | Payer: Medicare Other | Source: Ambulatory Visit | Attending: Nurse Practitioner | Admitting: Nurse Practitioner

## 2023-12-22 ENCOUNTER — Ambulatory Visit
Admission: EM | Admit: 2023-12-22 | Discharge: 2023-12-22 | Disposition: A | Discharge status: Home / Self Care | Payer: Medicare Other | Attending: Nurse Practitioner | Admitting: Nurse Practitioner

## 2023-12-22 ENCOUNTER — Telehealth: Payer: Self-pay | Admitting: Nurse Practitioner

## 2023-12-22 DIAGNOSIS — M25521 Pain in right elbow: Secondary | ICD-10-CM | POA: Diagnosis not present

## 2023-12-22 DIAGNOSIS — Z043 Encounter for examination and observation following other accident: Secondary | ICD-10-CM | POA: Diagnosis not present

## 2023-12-22 DIAGNOSIS — S51011A Laceration without foreign body of right elbow, initial encounter: Secondary | ICD-10-CM | POA: Diagnosis not present

## 2023-12-22 DIAGNOSIS — W11XXXA Fall on and from ladder, initial encounter: Secondary | ICD-10-CM | POA: Diagnosis not present

## 2023-12-22 DIAGNOSIS — M25421 Effusion, right elbow: Secondary | ICD-10-CM

## 2023-12-22 DIAGNOSIS — R58 Hemorrhage, not elsewhere classified: Secondary | ICD-10-CM | POA: Diagnosis not present

## 2023-12-22 MED ORDER — GELATIN ABSORBABLE 12-7 MM EX MISC: 1.0000 | Freq: Once | CUTANEOUS | Status: DC

## 2023-12-22 NOTE — Discharge Instructions (Addendum)
 Go to [facility] to the radiology department to have an x-ray of your elbow.  Once the results are received, we will give you a call. Keep the dressing in place on the elbow for the next 24 hours.  If you remove the dressing and the bleeding returns and you cannot get it to stop, you may follow-up in this clinic or in the emergency department. Keep the area clean and dry.  Recommend keeping the area wrapped when you are out in public.  When you are home and if it is not bleeding, you may leave it open to air.  You may apply over-the-counter Neosporin. RICE therapy, rest, ice, compression, and elevation.  Apply ice to the right elbow for 20 minutes, remove for 1 hour, repeat as needed while swelling persist. Gentle range of motion exercises of the elbow to help keep the joint mobile. Monitor the elbow for signs of infection.  This includes increased swelling, foul-smelling drainage, or increased redness.  If you develop any symptoms, you may follow-up in this clinic or with your primary care physician as soon as possible. As discussed, it is recommended that you follow-up with orthopedics for further evaluation if symptoms fail to improve.  I have given you information for [facility] and for [facility]. Follow-up as needed.

## 2023-12-22 NOTE — ED Provider Notes (Signed)
 [facility]    CSN: [id number] Arrival date & time: 12/22/23  1035      History   Chief Complaint No chief complaint on file.   HPI Christopher [name] is a 78 y.o. male.   The history is provided by the patient.   Patient presents for complaints of right elbow pain.  Patient states that he fell approximately 2 feet off of a ladder.  He states that he landed on his back and on his right elbow.  States that his elbow and back broke the fall, and that he hit his head.  Patient does have a skin tear to the right elbow.  States that the area will not stop bleeding.  States that he is on Eliquis , and the bleeding did not become uncontrolled until he took his Eliquis  earlier this morning.  Patient denies headache, loss of consciousness, dizziness, blurred vision, decreased range of motion of the elbow, or radiation of pain.  Patient does have moderate swelling to the elbow, elbow is bleeding at this time.  He also states that he has some numbness and tingling in his right hand.  He reports that he had a prior infection in the right elbow and had some disruption in how the bone was growing.  States that he saw Dr. Brenna about this several years ago.  Past Medical History:  Diagnosis Date   A-fib [facility])    Arthritis    all over my body (06/17/2017)   BPH (benign prostatic hyperplasia)    takes Proscar  daily   CAD (coronary artery disease)    a. s/p CABG in 09/2019 with SVG-PDA-OM and LIMA-LAD with LAA excision   Chronic back pain    DDD; upper and lower (06/17/2017)   Constipation    takes Colace daily   Coronary artery disease    DDD (degenerative disc disease), cervical    DVT (deep venous thrombosis) (HCC)    left peroneal vein 07/2017 (in setting of recent left TKA)   Dysrhythmia    history of Atrial fibrillation, no long has this   Enlarged prostate    GERD (gastroesophageal reflux disease)    takes Nexium daily   H/O hiatal hernia    History of colon polyps    benign    History of gastric ulcer    Insomnia    takes Xanax  nightly   Joint capsule tear    left knee   Joint pain    Joint swelling    Macular degeneration    Muscle spasm of back    takes Valium  daily as needed   PAF (paroxysmal atrial fibrillation) (HCC)    Peripheral neuropathy    takes Gabapentin  daily   PONV (postoperative nausea and vomiting)    Pre-diabetes    Sleep apnea    pt does not wear a cpap   Urinary hesitancy    Weakness    numbness and tingling    Patient Active Problem List   Diagnosis Date Noted   Hyperthyroidism 10/22/2022   Hearing loss 10/22/2022   Paroxysmal atrial fibrillation (HCC) 08/22/2022   Calculus of gallbladder without cholecystitis without obstruction    Bradycardia 08/20/2022   Bladder outlet obstruction 08/19/2022   Hypomagnesemia 08/19/2022   Malfunction of spinal cord stimulator (HCC) 04/25/2022   Elevated LFTs 02/13/2021   Leukocytosis 01/04/2021   Hyperglycemia 01/04/2021   Constipation 08/23/2020   Tiredness 08/23/2020   Nocturia 06/30/2020   Abdominal distension 08/18/2019   Coronary artery disease involving  native coronary artery of native heart with angina pectoris (HCC)    Upper airway cough syndrome 01/05/2019   DOE (dyspnea on exertion) 12/15/2018   Hx of colonic polyps 04/28/2018   Barrett's esophagus without dysplasia 04/15/2018   Gastroesophageal reflux disease without esophagitis 04/15/2018   History of colonic polyps 04/15/2018   Atrial fibrillation with RVR (HCC) 07/23/2017   History of gastric ulcer    Chronic back pain    BPH (benign prostatic hyperplasia)    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/20/2017   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   Chronic pain of right knee 05/14/2017   Spondylolisthesis of lumbar region 03/05/2014   History of colonic polyps 02/24/2013   Abdominal pain, epigastric 01/06/2013   Pain in joint, shoulder region 07/09/2012   Muscle weakness (generalized) 07/09/2012    Past  Surgical History:  Procedure Laterality Date   ANTERIOR CERVICAL DECOMP/DISCECTOMY FUSION  ~ 1991   took bone from hip   ANTERIOR CERVICAL DECOMP/DISCECTOMY FUSION  X 2   put plate in   ANTERIOR CERVICAL DISCECTOMY  <11/2005    5-6 and 4-5./notes 05/02/2011   APPENDECTOMY     BACK SURGERY     BIOPSY  05/30/2018   Procedure: BIOPSY;  Surgeon: Golda Claudis PENNER, MD;  Location: [facility];  Service: Endoscopy;;  esophagus   BIOPSY  01/09/2022   Procedure: BIOPSY;  Surgeon: Eartha Angelia Sieving, MD;  Location: [facility];  Service: Gastroenterology;;   CARDIAC CATHETERIZATION     3-4 per pt   CARPAL TUNNEL RELEASE Bilateral 10/2005 - 11/2005   right-left /notes 05/02/2011   CATARACT EXTRACTION W/PHACO Right 05/24/2014   Procedure: CATARACT EXTRACTION PHACO AND INTRAOCULAR LENS PLACEMENT (IOC);  Surgeon: Cherene Mania, MD;  Location: [facility];  Service: Ophthalmology;  Laterality: Right;  CDE 6.74   CATARACT EXTRACTION W/PHACO Left 06/17/2014   Procedure: CATARACT EXTRACTION PHACO AND INTRAOCULAR LENS PLACEMENT (IOC);  Surgeon: Cherene Mania, MD;  Location: [facility];  Service: Ophthalmology;  Laterality: Left;  CDE:4.65   COLONOSCOPY WITH ESOPHAGOGASTRODUODENOSCOPY (EGD) N/A 01/22/2013   Procedure: COLONOSCOPY WITH ESOPHAGOGASTRODUODENOSCOPY (EGD);  Surgeon: Claudis PENNER Golda, MD;  Location: [facility];  Service: Endoscopy;  Laterality: N/A;  140   COLONOSCOPY WITH PROPOFOL  N/A 05/30/2018   diverticulosis and external hemorrhoids   CORONARY ARTERY BYPASS GRAFT  2020   CYSTOSCOPY WITH INSERTION OF UROLIFT N/A 04/16/2022   Procedure: CYSTOSCOPY WITH INSERTION OF UROLIFT;  Surgeon: Sherrilee Belvie CROME, MD;  Location: [facility];  Service: Urology;  Laterality: N/A;   ESOPHAGOGASTRODUODENOSCOPY (EGD) WITH PROPOFOL  N/A 05/30/2018   normal esophagus, presence of possible barretts (bx consistent with reflux changes, GAVE)   ESOPHAGOGASTRODUODENOSCOPY (EGD) WITH PROPOFOL  N/A 01/09/2022   Procedure:  ESOPHAGOGASTRODUODENOSCOPY (EGD) WITH PROPOFOL ;  Surgeon: Eartha Angelia Sieving, MD;  Location: [facility];  Service: Gastroenterology;  Laterality: N/A;  305, moved up per Leigh Amm   EYE SURGERY Bilateral    cleaned film w/laser; since my 1st cataract OR   JOINT REPLACEMENT     LEFT HEART CATH AND CORONARY ANGIOGRAPHY N/A 02/16/2019   Procedure: LEFT HEART CATH AND CORONARY ANGIOGRAPHY;  Surgeon: Claudene Victory ORN, MD;  Location: [facility];  Service: Cardiovascular;  Laterality: N/A;   POSTERIOR LUMBAR FUSION     put a plate in   QUADRICEPS TENDON REPAIR Left 07/01/2017   QUADRICEPS TENDON REPAIR Left 07/01/2017   Procedure: REPAIR QUADRICEP TENDON;  Surgeon: Rubie Kemps, MD;  Location: [facility];  Service: Orthopedics;  Laterality: Left;   SHOULDER ARTHROSCOPY Bilateral    SPINAL CORD STIMULATOR INSERTION N/A 09/23/2015   Procedure: LUMBAR SPINAL CORD STIMULATOR INSERTION;  Surgeon: Deward Fabian, MD;  Location: [facility];  Service: Neurosurgery;  Laterality: N/A;  LUMBAR SPINAL CORD STIMULATOR INSERTION   SPINAL CORD STIMULATOR INSERTION N/A 04/25/2022   Procedure: REVISION OF  SPINAL CORD STIMULATOR;  Surgeon: Mavis Purchase, MD;  [facility];  Service: Neurosurgery;  Laterality: N/A;   TOTAL KNEE ARTHROPLASTY Left 06/17/2017   TOTAL KNEE ARTHROPLASTY Left 06/17/2017   Procedure: TOTAL KNEE ARTHROPLASTY;  Surgeon: Rubie Kemps, MD;  Location: [facility];  Service: Orthopedics;  Laterality: Left;   TOTAL KNEE ARTHROPLASTY Right 12/16/2017   Procedure: TOTAL KNEE ARTHROPLASTY;  Surgeon: Rubie Kemps, MD;  Location: [facility];  Service: Orthopedics;  Laterality: Right;       Home Medications    Prior to Admission medications   Medication Sig Start Date End Date Taking? Authorizing Provider  ALPRAZolam  (XANAX ) 1 MG tablet Take 1 mg by mouth at bedtime. 09/27/17   [provider]  amiodarone  (PACERONE ) 200 MG tablet TAKE 1 TABLET BY MOUTH EVERY DAY 09/10/23    Waddell Danelle ORN, MD  apixaban  (ELIQUIS ) 5 MG TABS tablet Take 1 tablet (5 mg total) by mouth 2 (two) times daily. *Restart 04/28/2022* Patient taking differently: Take 5 mg by mouth 2 (two) times daily. 04/25/22   Bergman, Meghan D, NP  ascorbic acid (VITAMIN C) 500 MG tablet Take 500 mg by mouth daily.    [provider]  aspirin  EC 81 MG tablet Take 1 tablet (81 mg total) by mouth daily. 02/11/19   Lelon Hamilton T, PA-C  atorvastatin  (LIPITOR ) 80 MG tablet TAKE 1 TABLET BY MOUTH EVERY DAY 10/15/23   Waddell Danelle ORN, MD  Calcium  Polycarbophil (FIBER-CAPS PO) Take 2 capsules by mouth daily.    [provider]  Cholecalciferol  (VITAMIN D3) 10 MCG (400 UNIT) CAPS Take 400 Units by mouth daily.    [provider]  Cyanocobalamin (B-12 PO) Take 1 tablet by mouth daily.    [provider]  docusate sodium  (COLACE) 250 MG capsule Take 250 mg by mouth daily.    [provider]  finasteride  (PROSCAR ) 5 MG tablet TAKE 1 TABLET (5 MG TOTAL) BY MOUTH DAILY. 09/24/23   McKenzie, Belvie CROME, MD  furosemide  (LASIX ) 20 MG tablet Take 1 tablet (20 mg total) by mouth daily. 01/02/21   Claudene Victory ORN, MD  gabapentin  (NEURONTIN ) 400 MG capsule One three times a day Patient taking differently: Take 400 mg by mouth 4 (four) times daily. 01/05/19   Wert, Michael B, MD  isosorbide  mononitrate (IMDUR ) 60 MG 24 hr tablet Take 60 mg by mouth daily.    [provider]  nitroGLYCERIN  (NITROSTAT ) 0.4 MG SL tablet Place 1 tablet (0.4 mg total) under the tongue every 5 (five) minutes as needed for chest pain. 08/26/19   Claudene Victory ORN, MD  Omega-3 1000 MG CAPS Take 1,000 mg by mouth daily.    [provider]  pantoprazole  (PROTONIX ) 40 MG tablet TAKE 1 TABLET BY MOUTH TWICE A DAY (30 TO 60 MINUTES BEFORE 1ST AND LAST MEALS OF THE DAY) Patient taking differently: 40 mg 2 (two) times daily. (30 TO 60 MINUTES BEFORE 1ST AND LAST MEALS OF THE DAY) 10/20/19   Darlean Ozell NOVAK, MD   potassium chloride  (KLOR-CON ) 10 MEQ tablet Take 10 mEq by mouth daily. 09/29/19  [provider]  silodosin  (RAPAFLO ) 8 MG CAPS capsule TAKE 1 CAPSULE (8 MG TOTAL) BY MOUTH AT BEDTIME. 04/11/23   McKenzie, Belvie CROME, MD  Simethicone  180 MG CAPS Take 1 capsule by mouth 3 (three) times daily.    [provider]  sulfamethoxazole -trimethoprim  (BACTRIM  DS) 800-160 MG tablet TAKE 1 TABLET BY MOUTH EVERY 12 HOURS 11/26/23   McKenzie, Belvie CROME, MD  traMADol  (ULTRAM ) 50 MG tablet Take 1-2 tablets (50-100 mg total) by mouth every 4 (four) hours as needed. 04/25/22   Bergman, Meghan D, NP  vitamin E 200 UNIT capsule Take 200 Units by mouth daily.    [provider]  Zinc  Sulfate (ZINC  15 PO) Take 15 mg by mouth daily.    [provider]    Family History History reviewed. No pertinent family history.  Social History Social History   Tobacco Use   Smoking status: Former    Current packs/day: 0.00    Average packs/day: 1 pack/day for 30.0 years (30.0 ttl pk-yrs)    Types: Cigarettes    Start date: 01/14/1957    Quit date: 01/14/1987    Years since quitting: 36.9   Smokeless tobacco: Former    Types: Chew   Tobacco comments:    quit smoking  & chewing by 1987  Vaping Use   Vaping status: Never Used  Substance Use Topics   Alcohol use: Yes    Comment: 2-3 beers per night   Drug use: No     Allergies   Tylenol  [acetaminophen ], Morphine  and codeine, and Propofol    Review of Systems Review of Systems Per HPI  Physical Exam Triage Vital Signs ED Triage Vitals  Encounter Vitals Group     BP 12/22/23 1045 117/63     Systolic BP Percentile --      Diastolic BP Percentile --      Pulse Rate 12/22/23 1045 (!) 49     Resp 12/22/23 1045 12     Temp 12/22/23 1045 (!) 97.4 F (36.3 C)     Temp src --      SpO2 12/22/23 1045 92 %     Weight --      Height --      Head Circumference --      Peak Flow --      Pain Score 12/22/23 1049 4     Pain Loc  --      Pain Education --      Exclude from Growth Chart --    No data found.  Updated Vital Signs BP 117/63 (BP Location: Left Arm)   Pulse (!) 49   Temp (!) 97.4 F (36.3 C)   Resp 12   SpO2 92%   Visual Acuity Right Eye Distance:   Left Eye Distance:   Bilateral Distance:    Right Eye Near:   Left Eye Near:    Bilateral Near:     Physical Exam Vitals and nursing note reviewed.  Constitutional:      General: He is not in acute distress.    Appearance: Normal appearance.  HENT:     Head: Normocephalic.  Eyes:     Extraocular Movements: Extraocular movements intact.     Conjunctiva/sclera: Conjunctivae normal.     Pupils: Pupils are equal, round, and reactive to light.  Cardiovascular:     Rate and Rhythm: Regular rhythm. Bradycardia present.     Pulses: Normal pulses.     Heart sounds: Normal heart sounds.  Pulmonary:  Effort: Pulmonary effort is normal. No respiratory distress.     Breath sounds: Normal breath sounds. No stridor. No wheezing, rhonchi or rales.  Abdominal:     Palpations: Abdomen is soft.  Musculoskeletal:     Right elbow: Swelling present. No deformity or effusion. Normal range of motion. Tenderness present in olecranon process.     Cervical back: Normal range of motion.     Comments: Patient presents with bleeding to the right elbow.  Area was cleaned with Hibiclens  soap and sterile water .  Surgifoam was applied with pressure dressing to see if bleeding will stop.  Skin:    General: Skin is warm and dry.  Neurological:     Mental Status: He is alert.     GCS: GCS eye subscore is 4. GCS verbal subscore is 5. GCS motor subscore is 6.     Cranial Nerves: Cranial nerves 2-12 are intact.     Sensory: Sensation is intact.     Motor: Motor function is intact.     Coordination: Coordination is intact.     Gait: Gait is intact.      UC Treatments / Results  Labs (all labs ordered are listed, but only abnormal results are displayed) Labs  Reviewed - No data to display  EKG   Radiology No results found.  Procedures Procedures (including critical care time)  Medications Ordered in UC Medications  gelatin adsorbable (GELFOAM/SURGIFOAM) sponge 12-7 mm 1 each (has no administration in time range)    Initial Impression / Assessment and Plan / UC Course  I have reviewed the triage vital signs and the nursing notes.  Pertinent labs & imaging results that were available during my care of the patient were reviewed by me and considered in my medical decision making (see chart for details).  Patient presents after a fall onto the right elbow over the past 24 hours.  On exam, patient with bleeding to the right elbow.  Surgifoam was applied, bleeding ceased.  Patient does have moderate swelling to the right elbow.  X-ray of the elbow was ordered.  Patient will [facility] at [facility] for [facility].  He will be contacted once results are received.  Supportive care recommendations were provided and discussed with the patient to include RICE therapy, and over-the-counter analgesics.  Also discussed with patient follow-up if bleeding returns.  Patient was in agreement with this plan of care and verbalizes understanding.  All questions were answered.  Patient stable for discharge.   Final Clinical Impressions(s) / UC Diagnoses   Final diagnoses:  Pain and swelling of right elbow  Skin tear of right elbow without complication, initial encounter     Discharge Instructions      Go to [facility] to the radiology department to have an x-ray of your elbow.  Once the results are received, we will give you a call. Keep the dressing in place on the elbow for the next 24 hours.  If you remove the dressing and the bleeding returns and you cannot get it to stop, you may follow-up in this clinic or in the emergency department. Keep the area clean and dry.  Recommend keeping the area wrapped when you are out in public.  When you  are home and if it is not bleeding, you may leave it open to air.  You may apply over-the-counter Neosporin. RICE therapy, rest, ice, compression, and elevation.  Apply ice to the right elbow for 20 minutes, remove for 1 hour, repeat  as needed while swelling persist. Gentle range of motion exercises of the elbow to help keep the joint mobile. Monitor the elbow for signs of infection.  This includes increased swelling, foul-smelling drainage, or increased redness.  If you develop any symptoms, you may follow-up in this clinic or with your primary care physician as soon as possible. As discussed, it is recommended that you follow-up with orthopedics for further evaluation if symptoms fail to improve.  I have given you information for [facility] and for [facility]. Follow-up as needed.     ED Prescriptions   None    PDMP not reviewed this encounter.   Gilmer Etta PARAS, NP 12/22/23 [phone]

## 2023-12-22 NOTE — Telephone Encounter (Signed)
 Reviewed reviewed x-ray result of the patient's right elbow.  X-ray was negative for fracture or dislocation.  Soft tissue swelling was present on the x-ray.  This was consistent with the patient's exam.  Call patient to advise of x-ray result.  Reach voicemail, left message for patient to recover return the call on 12/22/2022.

## 2023-12-22 NOTE — ED Triage Notes (Signed)
 Pt reports he fell 2 ft on his back and right elbow x 1 day  States his head hit the concrete as well but only slightly.

## 2024-02-09 ENCOUNTER — Other Ambulatory Visit: Payer: Self-pay | Admitting: Urology

## 2024-02-21 ENCOUNTER — Ambulatory Visit: Payer: Medicare Other | Admitting: Urology

## 2024-02-21 VITALS — BP 116/64 | HR 52

## 2024-02-21 DIAGNOSIS — Z87898 Personal history of other specified conditions: Secondary | ICD-10-CM

## 2024-02-21 DIAGNOSIS — Z87440 Personal history of urinary (tract) infections: Secondary | ICD-10-CM | POA: Diagnosis not present

## 2024-02-21 DIAGNOSIS — R82998 Other abnormal findings in urine: Secondary | ICD-10-CM

## 2024-02-21 DIAGNOSIS — R339 Retention of urine, unspecified: Secondary | ICD-10-CM

## 2024-02-21 DIAGNOSIS — N390 Urinary tract infection, site not specified: Secondary | ICD-10-CM

## 2024-02-21 DIAGNOSIS — N400 Enlarged prostate without lower urinary tract symptoms: Secondary | ICD-10-CM | POA: Diagnosis not present

## 2024-02-21 LAB — URINALYSIS, ROUTINE W REFLEX MICROSCOPIC
Bilirubin, UA: NEGATIVE
Glucose, UA: NEGATIVE
Ketones, UA: NEGATIVE
Leukocytes,UA: NEGATIVE
Nitrite, UA: NEGATIVE
Protein,UA: NEGATIVE
RBC, UA: NEGATIVE
Specific Gravity, UA: 1.02 (ref 1.005–1.030)
Urobilinogen, Ur: 0.2 mg/dL (ref 0.2–1.0)
pH, UA: 7 (ref 5.0–7.5)

## 2024-02-21 MED ORDER — DOXYCYCLINE HYCLATE 100 MG PO CAPS: 100.0000 mg | ORAL_CAPSULE | Freq: Two times a day (BID) | ORAL | 0 refills | Status: DC

## 2024-02-21 MED ORDER — TRIMETHOPRIM 100 MG PO TABS: 100.0000 mg | ORAL_TABLET | Freq: Every day | ORAL | 11 refills | Status: AC

## 2024-02-21 NOTE — Progress Notes (Signed)
 02/21/2024 10:25 AM   Christopher [name] 06/12/1946 [id number]  Referring provider: Assunta Found, MD [address] [place],  [place] [address]  Followup BPH   HPI: Mr Blizzard is a 78yo here for followup for BPH with urinary retention and recurrent UTI. He was diagnosed with a UTI in 10/2023 treated with bactrim. 3-4 weeks after completing therapy his symptoms returned and his urine has a foul smell.    PMH: Past Medical History:  Diagnosis Date   A-fib [facility])    Arthritis    "all over my body" (06/17/2017)   BPH (benign prostatic hyperplasia)    takes Proscar daily   CAD (coronary artery disease)    a. s/p CABG in 09/2019 with SVG-PDA-OM and LIMA-LAD with LAA excision   Chronic back pain    DDD; "upper and lower" (06/17/2017)   Constipation    takes Colace daily   Coronary artery disease    DDD (degenerative disc disease), cervical    DVT (deep venous thrombosis) (HCC)    left peroneal vein 07/2017 (in setting of recent left TKA)   Dysrhythmia    history of Atrial fibrillation, no long has this   Enlarged prostate    GERD (gastroesophageal reflux disease)    takes Nexium daily   H/O hiatal hernia    History of colon polyps    benign   History of gastric ulcer    Insomnia    takes Xanax nightly   Joint capsule tear    left knee   Joint pain    Joint swelling    Macular degeneration    Muscle spasm of back    takes Valium daily as needed   PAF (paroxysmal atrial fibrillation) (HCC)    Peripheral neuropathy    takes Gabapentin daily   PONV (postoperative nausea and vomiting)    Pre-diabetes    Sleep apnea    pt does not wear a cpap   Urinary hesitancy    Weakness    numbness and tingling    Surgical History: Past Surgical History:  Procedure Laterality Date   ANTERIOR CERVICAL DECOMP/DISCECTOMY FUSION  ~ 1991   "took bone from hip"   ANTERIOR CERVICAL DECOMP/DISCECTOMY FUSION  X 2   "put plate in"   ANTERIOR CERVICAL DISCECTOMY  <11/2005    5-6 and  4-5./notes 05/02/2011   APPENDECTOMY     BACK SURGERY     BIOPSY  05/30/2018   Procedure: BIOPSY;  Surgeon: Malissa Hippo, MD;  [facility];  Service: Endoscopy;;  esophagus   BIOPSY  01/09/2022   Procedure: BIOPSY;  Surgeon: Dolores Frame, MD;  [facility]: [facility];  Service: Gastroenterology;;   CARDIAC CATHETERIZATION     3-4 per pt   CARPAL TUNNEL RELEASE Bilateral 10/2005 - 11/2005   right-left /notes 05/02/2011   CATARACT EXTRACTION W/PHACO Right 05/24/2014   Procedure: CATARACT EXTRACTION PHACO AND INTRAOCULAR LENS PLACEMENT (IOC);  Surgeon: Gemma Payor, MD;  Location: [facility];  Service: Ophthalmology;  Laterality: Right;  CDE 6.74   CATARACT EXTRACTION W/PHACO Left 06/17/2014   Procedure: CATARACT EXTRACTION PHACO AND INTRAOCULAR LENS PLACEMENT (IOC);  Surgeon: Gemma Payor, MD;  Location: [facility];  Service: Ophthalmology;  Laterality: Left;  CDE:4.65   COLONOSCOPY WITH ESOPHAGOGASTRODUODENOSCOPY (EGD) N/A 01/22/2013   Procedure: COLONOSCOPY WITH ESOPHAGOGASTRODUODENOSCOPY (EGD);  Surgeon: Malissa Hippo, MD;  [facility]: [facility];  Service: Endoscopy;  Laterality: N/A;  140   COLONOSCOPY WITH PROPOFOL N/A 05/30/2018  diverticulosis and external hemorrhoids   CORONARY ARTERY BYPASS GRAFT  2020   CYSTOSCOPY WITH INSERTION OF UROLIFT N/A 04/16/2022   Procedure: CYSTOSCOPY WITH INSERTION OF UROLIFT;  Surgeon: Malen Gauze, MD;  Location: [facility];  Service: Urology;  Laterality: N/A;   ESOPHAGOGASTRODUODENOSCOPY (EGD) WITH PROPOFOL N/A 05/30/2018   normal esophagus, presence of possible barretts (bx consistent with reflux changes, GAVE)   ESOPHAGOGASTRODUODENOSCOPY (EGD) WITH PROPOFOL N/A 01/09/2022   Procedure: ESOPHAGOGASTRODUODENOSCOPY (EGD) WITH PROPOFOL;  Surgeon: Dolores Frame, MD;  [facility];  Service: Gastroenterology;  Laterality: N/A;  305, moved up per Leigh Amm   EYE SURGERY Bilateral    "cleaned film w/laser; since  my 1st cataract OR"   JOINT REPLACEMENT     LEFT HEART CATH AND CORONARY ANGIOGRAPHY N/A 02/16/2019   Procedure: LEFT HEART CATH AND CORONARY ANGIOGRAPHY;  Surgeon: Lyn Records, MD;  [facility];  Service: Cardiovascular;  Laterality: N/A;   POSTERIOR LUMBAR FUSION     "put a plate in"   QUADRICEPS TENDON REPAIR Left 07/01/2017   QUADRICEPS TENDON REPAIR Left 07/01/2017   Procedure: REPAIR QUADRICEP TENDON;  Surgeon: Dannielle Huh, MD;  Location: [facility];  Service: Orthopedics;  Laterality: Left;   SHOULDER ARTHROSCOPY Bilateral    SPINAL CORD STIMULATOR INSERTION N/A 09/23/2015   Procedure: LUMBAR SPINAL CORD STIMULATOR INSERTION;  Surgeon: Odette Fraction, MD;  Location: [facility];  Service: Neurosurgery;  Laterality: N/A;  LUMBAR SPINAL CORD STIMULATOR INSERTION   SPINAL CORD STIMULATOR INSERTION N/A 04/25/2022   Procedure: REVISION OF  SPINAL CORD STIMULATOR;  Surgeon: Tressie Stalker, MD;  Location: [facility];  Service: Neurosurgery;  Laterality: N/A;   TOTAL KNEE ARTHROPLASTY Left 06/17/2017   TOTAL KNEE ARTHROPLASTY Left 06/17/2017   Procedure: TOTAL KNEE ARTHROPLASTY;  Surgeon: Dannielle Huh, MD;  Location: [facility];  Service: Orthopedics;  Laterality: Left;   TOTAL KNEE ARTHROPLASTY Right 12/16/2017   Procedure: TOTAL KNEE ARTHROPLASTY;  Surgeon: Dannielle Huh, MD;  Location: [facility];  Service: Orthopedics;  Laterality: Right;    Home Medications:  Allergies as of 02/21/2024       Reactions   Tylenol [acetaminophen] Other (See Comments)   Stomach pain    Morphine And Codeine Nausea And Vomiting   Propofol Nausea And Vomiting        Medication List        Accurate as of February 21, 2024 10:25 AM. If you have any questions, ask your nurse or doctor.          ALPRAZolam 1 MG tablet Commonly known as: XANAX Take 1 mg by mouth at bedtime.   amiodarone 200 MG tablet Commonly known as: PACERONE TAKE 1 TABLET BY MOUTH EVERY DAY   apixaban 5 MG Tabs  tablet Commonly known as: ELIQUIS Take 1 tablet (5 mg total) by mouth 2 (two) times daily. *Restart 04/28/2022* What changed: additional instructions   ascorbic acid 500 MG tablet Commonly known as: VITAMIN C Take 500 mg by mouth daily.   aspirin EC 81 MG tablet Take 1 tablet (81 mg total) by mouth daily.   atorvastatin 80 MG tablet Commonly known as: LIPITOR TAKE 1 TABLET BY MOUTH EVERY DAY   B-12 PO Take 1 tablet by mouth daily.   docusate sodium 250 MG capsule Commonly known as: COLACE Take 250 mg by mouth daily.   FIBER-CAPS PO Take 2 capsules by mouth daily.   finasteride 5 MG tablet Commonly known as: PROSCAR TAKE  1 TABLET (5 MG TOTAL) BY MOUTH DAILY.   furosemide 20 MG tablet Commonly known as: LASIX Take 1 tablet (20 mg total) by mouth daily.   gabapentin 400 MG capsule Commonly known as: NEURONTIN One three times a day What changed:  how much to take how to take this when to take this additional instructions   isosorbide mononitrate 60 MG 24 hr tablet Commonly known as: IMDUR Take 60 mg by mouth daily.   nitroGLYCERIN 0.4 MG SL tablet Commonly known as: NITROSTAT Place 1 tablet (0.4 mg total) under the tongue every 5 (five) minutes as needed for chest pain.   Omega-3 1000 MG Caps Take 1,000 mg by mouth daily.   pantoprazole 40 MG tablet Commonly known as: PROTONIX TAKE 1 TABLET BY MOUTH TWICE A DAY (30 TO 60 MINUTES BEFORE 1ST AND LAST MEALS OF THE DAY) What changed:  how much to take when to take this additional instructions   potassium chloride 10 MEQ tablet Commonly known as: KLOR-CON M Take 10 mEq by mouth daily.   silodosin 8 MG Caps capsule Commonly known as: RAPAFLO TAKE 1 CAPSULE (8 MG TOTAL) BY MOUTH AT BEDTIME.   Simethicone 180 MG Caps Take 1 capsule by mouth 3 (three) times daily.   sulfamethoxazole-trimethoprim 800-160 MG tablet Commonly known as: BACTRIM DS TAKE 1 TABLET BY MOUTH EVERY 12 HOURS   traMADol 50 MG  tablet Commonly known as: ULTRAM Take 1-2 tablets (50-100 mg total) by mouth every 4 (four) hours as needed.   Vitamin D3 10 MCG (400 UNIT) Caps Take 400 Units by mouth daily.   vitamin E 200 UNIT capsule Take 200 Units by mouth daily.   ZINC 15 PO Take 15 mg by mouth daily.        Allergies:  Allergies  Allergen Reactions   Tylenol [Acetaminophen] Other (See Comments)    Stomach pain    Morphine And Codeine Nausea And Vomiting   Propofol Nausea And Vomiting    Family History: No family history on file.  Social History:  reports that he quit smoking about 37 years ago. His smoking use included cigarettes. He started smoking about 67 years ago. He has a 30 pack-year smoking history. He has quit using smokeless tobacco.  His smokeless tobacco use included chew. He reports current alcohol use. He reports that he does not use drugs.  ROS: All other review of systems were reviewed and are negative except what is noted above in HPI  Physical Exam: BP 116/64   Pulse (!) 52   Constitutional:  Alert and oriented, No acute distress. HEENT: [place] AT, moist mucus membranes.  Trachea midline, no masses. Cardiovascular: No clubbing, cyanosis, or edema. Respiratory: Normal respiratory effort, no increased work of breathing. GI: Abdomen is soft, nontender, nondistended, no abdominal masses GU: No CVA tenderness.  Lymph: No cervical or inguinal lymphadenopathy. Skin: No rashes, bruises or suspicious lesions. Neurologic: Grossly intact, no focal deficits, moving all 4 extremities. Psychiatric: Normal mood and affect.  Laboratory Data: Lab Results  Component Value Date   WBC 5.7 08/23/2022   HGB 10.3 (L) 08/23/2022   HCT 31.1 (L) 08/23/2022   MCV 89.9 08/23/2022   PLT 157 08/23/2022    Lab Results  Component Value Date   CREATININE 0.72 08/24/2022    No results found for: "PSA"  No results found for: "TESTOSTERONE"  Lab Results  Component Value Date   HGBA1C 5.6  12/31/2020    Urinalysis    Component Value  Date/Time   COLORURINE YELLOW 08/19/2022 1356   APPEARANCEUR Clear 11/18/2023 0920   LABSPEC 1.014 08/19/2022 1356   PHURINE 5.0 08/19/2022 1356   GLUCOSEU Negative 11/18/2023 0920   HGBUR LARGE (A) 08/19/2022 1356   BILIRUBINUR Negative 11/18/2023 0920   KETONESUR NEGATIVE 08/19/2022 1356   PROTEINUR Negative 11/18/2023 0920   PROTEINUR NEGATIVE 08/19/2022 1356   UROBILINOGEN 0.2 03/07/2014 2103   NITRITE Negative 11/18/2023 0920   NITRITE NEGATIVE 08/19/2022 1356   LEUKOCYTESUR Negative 11/18/2023 0920   LEUKOCYTESUR SMALL (A) 08/19/2022 1356    Lab Results  Component Value Date   LABMICR Comment 11/18/2023   WBCUA >30 (A) 11/01/2023   LABEPIT 0-10 11/01/2023   MUCUS Present 08/31/2022   BACTERIA Many (A) 11/01/2023    Pertinent Imaging:  No results found for this or any previous visit.  No results found for this or any previous visit.  No results found for this or any previous visit.  No results found for this or any previous visit.  Results for orders placed during the hospital encounter of 11/25/23  US RENAL  Narrative CLINICAL DATA:  Nephrolithiasis  EXAM: RENAL / URINARY TRACT ULTRASOUND COMPLETE  COMPARISON:  Ultrasound abdomen 08/20/2022  FINDINGS: Right Kidney:  Renal measurements: 10.9 x 4.6 x 5.9 cm = volume: 156 mL. Echogenicity within normal limits. No mass or hydronephrosis visualized.  Left Kidney:  Renal measurements: 9.9 x 5.2 x 5.9 cm = volume: 160 mL. Echogenicity within normal limits. No mass or hydronephrosis visualized.  Bladder:  Appears normal for degree of bladder distention.  Other:  None.  IMPRESSION: No hydronephrosis.   Electronically Signed By: Annia Belt M.D. On: 11/25/2023 12:10  No results found for this or any previous visit.  No results found for this or any previous visit.  Results for orders placed during the hospital encounter of 02/21/17  CT  RENAL STONE STUDY  Narrative CLINICAL DATA:  Right flank pain for 5 days, history of appendectomy  EXAM: CT ABDOMEN AND PELVIS WITHOUT CONTRAST  TECHNIQUE: Multidetector CT imaging of the abdomen and pelvis was performed following the standard protocol without IV contrast.  COMPARISON:  None.  FINDINGS: Lower chest: Lung bases shows no acute findings. Partially visualized lower thoracic spinal canal stimulator wires  Hepatobiliary: Unenhanced liver shows no biliary ductal dilatation. Question tiny noncalcified gallstones or sludge within dependent gallbladder the largest measures 3 mm.  Pancreas: Unenhanced pancreas with normal appearance.  Spleen: Unenhanced spleen with normal appearance.  Adrenals/Urinary Tract: No adrenal gland mass. Unenhanced kidneys are symmetrical in size. No hydronephrosis or hydroureter. No nephrolithiasis. No calcified ureteral calculi. No calcified calculi are noted within urinary bladder. Mild thickening of urinary bladder wall. Mild cystitis or chronic inflammation cannot be excluded.  Stomach/Bowel: There is no small bowel obstruction. The study is limited without oral or IV contrast. There is some gas in distal small bowel loops without thickening of small bowel wall. Minimal ileus cannot be excluded. There is no small bowel obstruction. No transition point in caliber of small bowel. No pericecal inflammation. The terminal ileum is unremarkable. The patient is status post appendectomy. Some colonic stool and gas noted within right colon. There is some colonic stool and gas within descending colon and sigmoid colon. No distal colonic obstruction. No definite evidence of colitis or diverticulitis.  Vascular/Lymphatic: Atherosclerotic calcifications of abdominal aorta and iliac arteries. No adenopathy is noted. No aortic aneurysm.  Reproductive: Mild enlarged prostate gland with indentation of urinary bladder base. Prostate gland  measures  3.8 by 4.3 cm.  Other: There is a left inguinal scrotal canal hernia containing fat measures 1.6 cm without evidence of acute complication. Small nonspecific bilateral inguinal lymph nodes are noted. The largest left inguinal lymph node measures 6 mm short-axis. The largest right inguinal lymph node measures 7.5 mm short-axis. These are not pathologic by size criteria.  Musculoskeletal: No destructive bony lesions are noted. Sagittal images of the spine shows degenerative changes thoracolumbar spine. Posterior fusion noted lower lumbar spine at L4-L5 level with anatomic alignment.  IMPRESSION: 1. No nephrolithiasis. No hydronephrosis or hydroureter. Question tiny layering sludge or noncalcified gallstones within gallbladder the largest measures 3 mm. 2. No calcified calculi are noted within urinary bladder. Mild thickening of urinary bladder wall. Mild cystitis or chronic inflammation cannot be excluded. Clinical correlation is necessary. 3. Some gas noted within distal small bowel loops without significant small bowel distension. No transition point in caliber of small bowel. Mild ileus cannot be excluded. No definite evidence of small bowel obstruction. 4. No pericecal inflammation. The patient is status post appendectomy. Unremarkable terminal ileum. Moderate stool noted in cecum and proximal right colon. 5. No calcified ureteral calculi are noted. 6. Mild enlarged prostate gland with indentation of the bladder base. 7. Small nonspecific bilateral inguinal lymph nodes are noted. There is a small left inguinal scrotal canal hernia containing fat measures 1.6 cm no evidence of acute complication.   Electronically Signed By: Natasha Mead M.D. On: 02/22/2017 09:02   Assessment & Plan:    1. Benign prostatic hyperplasia, unspecified whether lower urinary tract symptoms present (Primary) Continue rapaflo 8mg  daily - Urinalysis, Routine w reflex microscopic  2. Recurrent  UTI Urine for culture Doxycycyline 100mg  BID for 28 days We will trial trimethoprim at bedtime for prophylaxis.  3. Urinary retention resolved   No follow-ups on file.  Wilkie Aye, MD  [facility] 

## 2024-02-25 ENCOUNTER — Encounter: Payer: Self-pay | Admitting: Urology

## 2024-02-25 NOTE — Patient Instructions (Signed)

## 2024-03-10 DIAGNOSIS — Z6824 Body mass index (BMI) 24.0-24.9, adult: Secondary | ICD-10-CM | POA: Diagnosis not present

## 2024-03-10 DIAGNOSIS — I4819 Other persistent atrial fibrillation: Secondary | ICD-10-CM | POA: Diagnosis not present

## 2024-03-18 DIAGNOSIS — L237 Allergic contact dermatitis due to plants, except food: Secondary | ICD-10-CM | POA: Diagnosis not present

## 2024-03-18 DIAGNOSIS — Z6824 Body mass index (BMI) 24.0-24.9, adult: Secondary | ICD-10-CM | POA: Diagnosis not present

## 2024-04-11 DIAGNOSIS — W57XXXA Bitten or stung by nonvenomous insect and other nonvenomous arthropods, initial encounter: Secondary | ICD-10-CM | POA: Diagnosis not present

## 2024-04-11 DIAGNOSIS — L03116 Cellulitis of left lower limb: Secondary | ICD-10-CM | POA: Diagnosis not present

## 2024-04-14 ENCOUNTER — Other Ambulatory Visit: Payer: Self-pay | Admitting: Internal Medicine

## 2024-04-14 ENCOUNTER — Other Ambulatory Visit: Payer: Self-pay | Admitting: Urology

## 2024-04-14 DIAGNOSIS — M545 Low back pain, unspecified: Secondary | ICD-10-CM

## 2024-04-14 DIAGNOSIS — Z8619 Personal history of other infectious and parasitic diseases: Secondary | ICD-10-CM

## 2024-04-14 DIAGNOSIS — N401 Enlarged prostate with lower urinary tract symptoms: Secondary | ICD-10-CM

## 2024-04-14 DIAGNOSIS — R8290 Unspecified abnormal findings in urine: Secondary | ICD-10-CM

## 2024-04-28 ENCOUNTER — Encounter (HOSPITAL_BASED_OUTPATIENT_CLINIC_OR_DEPARTMENT_OTHER): Payer: Self-pay | Admitting: Emergency Medicine

## 2024-04-28 ENCOUNTER — Emergency Department (HOSPITAL_BASED_OUTPATIENT_CLINIC_OR_DEPARTMENT_OTHER)

## 2024-04-28 ENCOUNTER — Telehealth: Payer: Self-pay

## 2024-04-28 ENCOUNTER — Encounter (HOSPITAL_COMMUNITY): Payer: Self-pay

## 2024-04-28 ENCOUNTER — Ambulatory Visit (HOSPITAL_COMMUNITY)
Admission: EM | Admit: 2024-04-28 | Discharge: 2024-04-28 | Disposition: A | Discharge status: Home / Self Care | Attending: Family Medicine | Admitting: Family Medicine

## 2024-04-28 ENCOUNTER — Other Ambulatory Visit: Payer: Self-pay

## 2024-04-28 ENCOUNTER — Emergency Department (HOSPITAL_BASED_OUTPATIENT_CLINIC_OR_DEPARTMENT_OTHER)
Admission: EM | Admit: 2024-04-28 | Discharge: 2024-04-28 | Disposition: A | Discharge status: Home / Self Care | Attending: Emergency Medicine | Admitting: Emergency Medicine

## 2024-04-28 DIAGNOSIS — N200 Calculus of kidney: Secondary | ICD-10-CM | POA: Insufficient documentation

## 2024-04-28 DIAGNOSIS — R109 Unspecified abdominal pain: Secondary | ICD-10-CM

## 2024-04-28 DIAGNOSIS — K8020 Calculus of gallbladder without cholecystitis without obstruction: Secondary | ICD-10-CM | POA: Diagnosis not present

## 2024-04-28 DIAGNOSIS — I2510 Atherosclerotic heart disease of native coronary artery without angina pectoris: Secondary | ICD-10-CM | POA: Insufficient documentation

## 2024-04-28 DIAGNOSIS — Z7901 Long term (current) use of anticoagulants: Secondary | ICD-10-CM | POA: Insufficient documentation

## 2024-04-28 DIAGNOSIS — Z7982 Long term (current) use of aspirin: Secondary | ICD-10-CM | POA: Diagnosis not present

## 2024-04-28 DIAGNOSIS — K5900 Constipation, unspecified: Secondary | ICD-10-CM | POA: Insufficient documentation

## 2024-04-28 DIAGNOSIS — R001 Bradycardia, unspecified: Secondary | ICD-10-CM | POA: Diagnosis not present

## 2024-04-28 LAB — URINALYSIS, ROUTINE W REFLEX MICROSCOPIC
Bilirubin Urine: NEGATIVE
Glucose, UA: NEGATIVE mg/dL
Hgb urine dipstick: NEGATIVE
Ketones, ur: NEGATIVE mg/dL
Leukocytes,Ua: NEGATIVE
Nitrite: NEGATIVE
Protein, ur: NEGATIVE mg/dL
Specific Gravity, Urine: 1.017 (ref 1.005–1.030)
pH: 5.5 (ref 5.0–8.0)

## 2024-04-28 LAB — CBC WITH DIFFERENTIAL/PLATELET
Abs Immature Granulocytes: 0.01 10*3/uL (ref 0.00–0.07)
Basophils Absolute: 0 10*3/uL (ref 0.0–0.1)
Basophils Relative: 1 %
Eosinophils Absolute: 0.1 10*3/uL (ref 0.0–0.5)
Eosinophils Relative: 2 %
HCT: 40.1 % (ref 39.0–52.0)
Hemoglobin: 13.1 g/dL (ref 13.0–17.0)
Immature Granulocytes: 0 %
Lymphocytes Relative: 36 %
Lymphs Abs: 1.7 10*3/uL (ref 0.7–4.0)
MCH: 30.3 pg (ref 26.0–34.0)
MCHC: 32.7 g/dL (ref 30.0–36.0)
MCV: 92.8 fL (ref 80.0–100.0)
Monocytes Absolute: 0.5 10*3/uL (ref 0.1–1.0)
Monocytes Relative: 11 %
Neutro Abs: 2.3 10*3/uL (ref 1.7–7.7)
Neutrophils Relative %: 50 %
Platelets: 225 10*3/uL (ref 150–400)
RBC: 4.32 MIL/uL (ref 4.22–5.81)
RDW: 13.4 % (ref 11.5–15.5)
WBC: 4.7 10*3/uL (ref 4.0–10.5)
nRBC: 0 % (ref 0.0–0.2)

## 2024-04-28 LAB — LIPASE, BLOOD: Lipase: 27 U/L (ref 11–51)

## 2024-04-28 LAB — COMPREHENSIVE METABOLIC PANEL WITH GFR
ALT: 32 U/L (ref 0–44)
AST: 38 U/L (ref 15–41)
Albumin: 4.3 g/dL (ref 3.5–5.0)
Alkaline Phosphatase: 105 U/L (ref 38–126)
Anion gap: 12 (ref 5–15)
BUN: 14 mg/dL (ref 8–23)
CO2: 25 mmol/L (ref 22–32)
Calcium: 9.5 mg/dL (ref 8.9–10.3)
Chloride: 104 mmol/L (ref 98–111)
Creatinine, Ser: 1.11 mg/dL (ref 0.61–1.24)
GFR, Estimated: >60 mL/min (ref >60)
Glucose, Bld: 95 mg/dL (ref 70–99)
Potassium: 4.3 mmol/L (ref 3.5–5.1)
Sodium: 141 mmol/L (ref 135–145)
Total Bilirubin: 0.5 mg/dL (ref 0.0–1.2)
Total Protein: 6.6 g/dL (ref 6.5–8.1)

## 2024-04-28 LAB — POCT URINALYSIS DIP (MANUAL ENTRY)
Bilirubin, UA: NEGATIVE
Blood, UA: NEGATIVE
Glucose, UA: NEGATIVE mg/dL
Ketones, POC UA: NEGATIVE mg/dL
Leukocytes, UA: NEGATIVE
Nitrite, UA: NEGATIVE
Protein Ur, POC: NEGATIVE mg/dL
Spec Grav, UA: 1.02 (ref 1.010–1.025)
Urobilinogen, UA: 0.2 U/dL
pH, UA: 5.5 (ref 5.0–8.0)

## 2024-04-28 LAB — TROPONIN T, HIGH SENSITIVITY: Troponin T High Sensitivity: <15 ng/L (ref <19)

## 2024-04-28 MED ORDER — LIDOCAINE 5 % EX PTCH
1.0000 | MEDICATED_PATCH | Freq: Once | CUTANEOUS | Status: DC
  Administered 2024-04-28: 1 via TRANSDERMAL
  Filled 2024-04-28: qty 1

## 2024-04-28 MED ORDER — LIDOCAINE 5 % EX PTCH: 1.0000 | MEDICATED_PATCH | CUTANEOUS | 0 refills | Status: AC

## 2024-04-28 MED ORDER — POLYETHYLENE GLYCOL 3350 17 GM/SCOOP PO POWD: 17.0000 g | Freq: Two times a day (BID) | ORAL | 1 refills | Status: AC

## 2024-04-28 MED ORDER — KETOROLAC TROMETHAMINE 30 MG/ML IJ SOLN
15.0000 mg | Freq: Once | INTRAMUSCULAR | Status: AC
  Administered 2024-04-28: 15 mg via INTRAMUSCULAR

## 2024-04-28 MED ORDER — KETOROLAC TROMETHAMINE 30 MG/ML IJ SOLN
INTRAMUSCULAR | Status: AC
  Filled 2024-04-28: qty 1

## 2024-04-28 NOTE — Discharge Instructions (Signed)
 Please read and follow all provided instructions.  Your diagnoses today include:  1. Acute flank pain   2. Calculus of gallbladder without cholecystitis without obstruction   3. Constipation, unspecified constipation type     Tests performed today include: Complete blood cell count: Was normal Complete metabolic panel: Was normal Lipase (pancreas function test): Was normal Urinalysis (urine test): No sign of bleeding or infection CT scan of your abdomen pelvis did not show any concerning findings.  You do have gallstones which can cause pain in your side.  You have a kidney stone inside of the kidney which is not likely causing pain.  You also have a fair amount of stool in the bowel from constipation. Vital signs. See below for your results today.   Medications prescribed:  Miralax  - laxative  This medication can be found over-the-counter.   Lidocaine  patch: Topical medication to try to see if it helps with the pain  Take any prescribed medications only as directed.  Home care instructions:  Follow any educational materials contained in this packet.  Follow-up instructions: Please follow-up with your primary care provider in the next 7 days for further evaluation of your symptoms.    Return instructions:  SEEK IMMEDIATE MEDICAL ATTENTION IF: The pain does not go away or becomes severe  A temperature above 101F develops  Repeated vomiting occurs (multiple episodes)  The pain becomes localized to portions of the abdomen. The right side could possibly be appendicitis. In an adult, the left lower portion of the abdomen could be colitis or diverticulitis.  Blood is being passed in stools or vomit (bright red or black tarry stools)  You develop chest pain, difficulty breathing, dizziness or fainting, or become confused, poorly responsive, or inconsolable (young children) If you have any other emergent concerns regarding your health  Additional Information: Abdominal (belly) pain  can be caused by many things. Your caregiver performed an examination and possibly ordered blood/urine tests and imaging (CT scan, x-rays, ultrasound). Many cases can be observed and treated at home after initial evaluation in the emergency department. Even though you are being discharged home, abdominal pain can be unpredictable. Therefore, you need a repeated exam if your pain does not resolve, returns, or worsens. Most patients with abdominal pain don't have to be admitted to the hospital or have surgery, but serious problems like appendicitis and gallbladder attacks can start out as nonspecific pain. Many abdominal conditions cannot be diagnosed in one visit, so follow-up evaluations are very important.  Your vital signs today were: BP 127/62   Pulse (!) 45   Temp 98.2 F (36.8 C) (Oral)   Resp 14   SpO2 97%  If your blood pressure (bp) was elevated above 135/85 this visit, please have this repeated by your doctor within one month. --------------

## 2024-04-28 NOTE — ED Notes (Signed)
 Discharge instructions, follow up care, and prescriptions reviewed and explained, pt verbalized understanding and had no further questions on d/c. Pt caox4, ambulatory, NAD on d/c.

## 2024-04-28 NOTE — Discharge Instructions (Signed)
Meds ordered this encounter  Medications   ketorolac (TORADOL) 30 MG/ML injection 15 mg    

## 2024-04-28 NOTE — ED Provider Notes (Cosign Needed Addendum)
 [place] EMERGENCY DEPARTMENT AT [facility] Provider Note   CSN: [id number] Arrival date & time: 04/28/24  1234     History  Chief Complaint  Patient presents with   Flank Pain    Christopher [name] is a 78 y.o. male.  Patient with h/o gastric ulcer, coronary artery disease, BPH s/p urolift procedure, atrial fibrillation on chronic anticoagulation, cholelithiasis, sepsis due to Enterococcus thought from urinary tract September 2023 --presents to the emergency department today for evaluation of right-sided flank pain.  This has been a recurrent problem for the patient.  He states that it occurs every 3 to 6 months.  Patient reports 3 days of pain in the right flank, right at the lower portion of the rib.  Pain waxes and wanes, can be very abrupt and severe at times.  No associated vomiting or diarrhea.  He does have constipation at baseline, recently worse, reports taking MiraLAX  in the morning with coffee.  No urinary symptoms.  No anterior abdominal pain.  No chest pain or shortness of breath.  Symptoms are not exertional.  No diaphoresis.  Patient went to urgent care prior to arrival today and received a shot of Toradol .  Urine dipstick was negative.  They recommended that he be evaluated in the emergency department to evaluate for other etiology.       Home Medications Prior to Admission medications   Medication Sig Start Date End Date Taking? Authorizing Provider  ALPRAZolam  (XANAX ) 1 MG tablet Take 1 mg by mouth at bedtime. 09/27/17   [provider]  amiodarone  (PACERONE ) 200 MG tablet TAKE 1 TABLET BY MOUTH EVERY DAY 09/10/23   Tammie Fall, MD  apixaban  (ELIQUIS ) 5 MG TABS tablet Take 1 tablet (5 mg total) by mouth 2 (two) times daily. *Restart 04/28/2022* Patient taking differently: Take 5 mg by mouth 2 (two) times daily. 04/25/22   Bergman, Meghan D, NP  ascorbic acid (VITAMIN C) 500 MG tablet Take 500 mg by mouth daily.    [provider]  aspirin  EC  81 MG tablet Take 1 tablet (81 mg total) by mouth daily. 02/11/19   Marlyse Single T, PA-C  atorvastatin  (LIPITOR ) 80 MG tablet TAKE 1 TABLET BY MOUTH EVERY DAY 04/14/24   Tammie Fall, MD  Calcium  Polycarbophil (FIBER-CAPS PO) Take 2 capsules by mouth daily.    [provider]  Cholecalciferol  (VITAMIN D3) 10 MCG (400 UNIT) CAPS Take 400 Units by mouth daily.    [provider]  Cyanocobalamin (B-12 PO) Take 1 tablet by mouth daily.    [provider]  docusate sodium  (COLACE) 250 MG capsule Take 250 mg by mouth daily.    [provider]  doxycycline  (VIBRAMYCIN ) 100 MG capsule TAKE 1 CAPSULE BY MOUTH EVERY 12 HOURS 04/21/24   McKenzie, Arden Beck, MD  finasteride  (PROSCAR ) 5 MG tablet TAKE 1 TABLET (5 MG TOTAL) BY MOUTH DAILY. 09/24/23   McKenzie, Arden Beck, MD  furosemide  (LASIX ) 20 MG tablet Take 1 tablet (20 mg total) by mouth daily. 01/02/21   Arty Binning, MD  gabapentin  (NEURONTIN ) 400 MG capsule One three times a day Patient taking differently: Take 400 mg by mouth 4 (four) times daily. 01/05/19   Wert, Michael B, MD  isosorbide  mononitrate (IMDUR ) 60 MG 24 hr tablet Take 60 mg by mouth daily.    [provider]  nitroGLYCERIN  (NITROSTAT ) 0.4 MG SL tablet Place 1 tablet (0.4 mg total) under the tongue every 5 (five) minutes  as needed for chest pain. 08/26/19   Arty Binning, MD  Omega-3 1000 MG CAPS Take 1,000 mg by mouth daily.    [provider]  pantoprazole  (PROTONIX ) 40 MG tablet TAKE 1 TABLET BY MOUTH TWICE A DAY (30 TO 60 MINUTES BEFORE 1ST AND LAST MEALS OF THE DAY) Patient taking differently: 40 mg 2 (two) times daily. (30 TO 60 MINUTES BEFORE 1ST AND LAST MEALS OF THE DAY) 10/20/19   Diamond Formica, MD  potassium chloride  (KLOR-CON ) 10 MEQ tablet Take 10 mEq by mouth daily. 09/29/19   [provider]  silodosin  (RAPAFLO ) 8 MG CAPS capsule TAKE 1 CAPSULE (8 MG TOTAL) BY MOUTH AT BEDTIME. 02/11/24   McKenzie, Arden Beck, MD   Simethicone  180 MG CAPS Take 1 capsule by mouth 3 (three) times daily.    [provider]  sulfamethoxazole -trimethoprim  (BACTRIM  DS) 800-160 MG tablet TAKE 1 TABLET BY MOUTH EVERY 12 HOURS 04/21/24   McKenzie, Arden Beck, MD  traMADol  (ULTRAM ) 50 MG tablet Take 1-2 tablets (50-100 mg total) by mouth every 4 (four) hours as needed. 04/25/22   Bergman, Meghan D, NP  trimethoprim  (TRIMPEX ) 100 MG tablet Take 1 tablet (100 mg total) by mouth at bedtime. 02/21/24   McKenzie, Arden Beck, MD  vitamin E 200 UNIT capsule Take 200 Units by mouth daily.    [provider]  Zinc  Sulfate (ZINC  15 PO) Take 15 mg by mouth daily.    [provider]      Allergies    Tylenol  [acetaminophen ], Morphine and codeine, and Propofol     Review of Systems   Review of Systems  Physical Exam Updated Vital Signs BP (!) 109/56 (BP Location: Left Arm)   Pulse (!) 53   Temp 98.2 F (36.8 C) (Oral)   Resp 18   SpO2 98%  Physical Exam Vitals and nursing note reviewed.  Constitutional:      General: He is not in acute distress.    Appearance: He is well-developed.  HENT:     Head: Normocephalic and atraumatic.  Eyes:     General:        Right eye: No discharge.        Left eye: No discharge.     Conjunctiva/sclera: Conjunctivae normal.  Cardiovascular:     Rate and Rhythm: Regular rhythm. Bradycardia present.     Heart sounds: Normal heart sounds.  Pulmonary:     Effort: Pulmonary effort is normal.     Breath sounds: Normal breath sounds.  Abdominal:     Palpations: Abdomen is soft.     Tenderness: There is no abdominal tenderness.  Musculoskeletal:     Cervical back: Normal range of motion and neck supple. No tenderness.     Thoracic back: Tenderness present. Normal range of motion.     Lumbar back: No tenderness. Normal range of motion.       Back:  Skin:    General: Skin is warm and dry.  Neurological:     Mental Status: He is alert.     ED Results / Procedures /  Treatments   Labs (all labs ordered are listed, but only abnormal results are displayed) Labs Reviewed  URINALYSIS, ROUTINE W REFLEX MICROSCOPIC  CBC WITH DIFFERENTIAL/PLATELET  COMPREHENSIVE METABOLIC PANEL WITH GFR  LIPASE, BLOOD  TROPONIN T, HIGH SENSITIVITY    EKG None  Radiology CT Renal Stone Study Result Date: 04/28/2024 CLINICAL DATA:  Abdominal/flank pain EXAM: CT ABDOMEN AND PELVIS WITHOUT CONTRAST  TECHNIQUE: Multidetector CT imaging of the abdomen and pelvis was performed following the standard protocol without IV contrast. RADIATION DOSE REDUCTION: This exam was performed according to the departmental dose-optimization program which includes automated exposure control, adjustment of the mA and/or kV according to patient size and/or use of iterative reconstruction technique. COMPARISON:  None Available. FINDINGS: Lower chest: Nothing significant. Hepatobiliary: Gallstones. Pancreas: Unremarkable. No pancreatic ductal dilatation or surrounding inflammatory changes. Spleen: Normal in size without focal abnormality. Adrenals/Urinary Tract: A nonobstructing 3 mm stone is present in the midpole of the right kidney. No hydronephrosis. The urinary bladder is grossly unremarkable. Stomach/Bowel: The stomach is nondilated. No dilated loops of small bowel are appreciated. A large volume of stool material is present in the colon. The appendix is not clearly seen but there is no evidence of significant inflammatory change in the right lower quadrant. Vascular/Lymphatic: No significant vascular findings are present. No enlarged abdominal or pelvic lymph nodes. Reproductive: Post treatment changes are present in the prostatic bed. Other: Diastasis of the ventral abdominal wall. Implantable device overlies the soft tissues of the left flank. Musculoskeletal: Degenerative changes are present in the lumbar spine with postsurgical changes from posterior lumbar fusion of L4-L5. IMPRESSION: 1. A large volume  of stool material is present throughout the colon. Correlate clinically for signs of constipation. 2. Right-sided nephrolithiasis without evidence of obstruction or hydronephrosis. 3. Gallstones. Electronically Signed   By: Reagan Camera M.D.   On: 04/28/2024 15:09    Procedures Procedures    Medications Ordered in ED Medications - No data to display  ED Course/ Medical Decision Making/ A&P    Patient seen and examined. History obtained directly from patient.  Reviewed previous hospitalization and outpatient urology notes.  Labs/EKG: Ordered CBC, CMP, lipase, UA, troponin and EKG  Imaging: Depending upon lab results, will likely need CT or ultrasound  Medications/Fluids: Pain currently controlled, no medications ordered  Most recent vital signs reviewed and are as follows: BP (!) 109/56 (BP Location: Left Arm)   Pulse (!) 53   Temp 98.2 F (36.8 C) (Oral)   Resp 18   SpO2 98%   Initial impression: Right-sided flank pain  4:08 PM Reassessment performed. Patient appears stable, comfortable.  Chronic bradycardia.  Labs personally reviewed and interpreted including: CBC, CMP, lipase, UA all unremarkable.  Imaging personally visualized and interpreted including: CT scan renal protocol shows gallstones, small intrarenal stone on the right, constipation.  No other emergent findings.  Reviewed pertinent lab work and imaging with patient at bedside. Questions answered.   Most current vital signs reviewed and are as follows: BP 127/62   Pulse (!) 45   Temp 98.2 F (36.8 C) (Oral)   Resp 14   SpO2 97%   Plan: Discharge to home.   Prescriptions written for: Lidocaine , MiraLAX   Other home care instructions discussed: Encouraged MiraLAX  up to twice a day, patient currently taking it once a day in the morning.  Discussed avoidance of foods which could make the symptoms worse.  ED return instructions discussed: The patient was urged to return to the Emergency Department  immediately with worsening of current symptoms, worsening abdominal pain, persistent vomiting, blood noted in stools, fever, or any other concerns. The patient verbalized understanding.   Follow-up instructions discussed: Patient encouraged to follow-up with their PCP in 7 days.  Medical Decision Making Amount and/or Complexity of Data Reviewed Labs: ordered. Radiology: ordered.  Risk Prescription drug management.   For this patient's complaint of flank pain, the following conditions were considered on the differential diagnosis: gastritis/PUD, enteritis/duodenitis, appendicitis, cholelithiasis/cholecystitis, cholangitis, pancreatitis, ruptured viscus, colitis, diverticulitis, small/large bowel obstruction, proctitis, cystitis, pyelonephritis, ureteral colic, aortic dissection, aortic aneurysm. Atypical chest etiologies were also considered including ACS, PE, and pneumonia.  Lab workup today is all normal.  Possible etiologies for the patient's pain includes cholelithiasis, musculoskeletal pain, constipation.  Less likely renal colic.  Treatment plan as above.  Patient's pain much improved after receiving IM Toradol  prior to arrival.  The patient's vital signs, pertinent lab work and imaging were reviewed and interpreted as discussed in the ED course. Hospitalization was considered for further testing, treatments, or serial exams/observation. However as patient is well-appearing, has a stable exam, and reassuring studies today, I do not feel that they warrant admission at this time. This plan was discussed with the patient who verbalizes agreement and comfort with this plan and seems reliable and able to return to the Emergency Department with worsening or changing symptoms.           Final Clinical Impression(s) / ED Diagnoses Final diagnoses:  Acute flank pain  Calculus of gallbladder without cholecystitis without obstruction  Constipation,  unspecified constipation type    Rx / DC Orders ED Discharge Orders          Ordered    polyethylene glycol powder (GLYCOLAX /MIRALAX ) 17 GM/SCOOP powder  2 times daily        04/28/24 1605    lidocaine  (LIDODERM ) 5 %  Every 24 hours        04/28/24 1605              Lyna Sandhoff, PA-C 04/28/24 1609    Lyna Sandhoff, PA-C 04/28/24 1610    Scarlette Currier, MD 04/29/24 [phone]

## 2024-04-28 NOTE — ED Notes (Signed)
 Patient is being discharged from the Urgent Care and sent to the Emergency Department via POV . Per Afton Albright, MD, patient is in need of higher level of care due to flank pain. Patient is aware and verbalizes understanding of plan of care.  Vitals:   04/28/24 1055  BP: 103/66  Pulse: (!) 51  Resp: 20  Temp: (!) 97.5 F (36.4 C)  SpO2: 95%

## 2024-04-28 NOTE — ED Triage Notes (Signed)
 Pt c/o rt flank/mid upper back pain x3 days. Denies urinary sx's or injury. Pt worse on movement. Took ibuprofen  yesterday with relief.

## 2024-04-28 NOTE — ED Triage Notes (Signed)
 C/o R sided flank pain x 3 days. Denies urinary symptoms or n/v/d. Some relief with otc meds.

## 2024-04-28 NOTE — Telephone Encounter (Signed)
 Patient called x  3 with no answer. Message left informing patient office was closed today and to reach out to his PCP , urgent care ofr ER pending the severity of the pain.

## 2024-04-28 NOTE — ED Provider Notes (Addendum)
 [facility]   [id number] 04/28/24 Arrival Time: 1028  ASSESSMENT & PLAN:  1. Right flank pain    Meds ordered this encounter  Medications   ketorolac  (TORADOL ) 30 MG/ML injection 15 mg     Results for orders placed or performed during the hospital encounter of 04/28/24  POC urinalysis dipstick   Collection Time: 04/28/24 11:05 AM  Result Value Ref Range   Color, UA yellow yellow   Clarity, UA clear clear   Glucose, UA negative negative mg/dL   Bilirubin, UA negative negative   Ketones, POC UA negative negative mg/dL   Spec Grav, UA 4.098 1.191 - 1.025   Blood, UA negative negative   pH, UA 5.5 5.0 - 8.0   Protein Ur, POC negative negative mg/dL   Urobilinogen, UA 0.2 0.2 or 1.0 E.U./dL   Nitrite, UA Negative Negative   Leukocytes, UA Negative Negative   Doubt kidney stone. Discussed that I think this is a muscle spasm of his back. Without resp difficulties. Declines trial of Rx NSAID/muscle relaxer.  Prefers ED evaluation in [place]. Recommend:  Follow-up Information     Go to  [facility] Emergency Department at [facility].   Specialty: Emergency Medicine Contact information: [address] Christopher [name]  [phone]                 Reviewed expectations re: course of current medical issues. Questions answered. Outlined signs and symptoms indicating need for more acute intervention. Patient verbalized understanding. After Visit Summary given.  SUBJECTIVE: History from: patient. Christopher [name] is a 78 y.o. male who reports non-radiating R flank/mid back pain; abrupt onset; x 3 days; denies injury/trauma. Pain worse with certain movements. Fairly persistent. H/O similar for many years. Denies bowel/bladder habit changes. Denies hematuria. Denies fever/n/v/abd pain. Ibuprofen  does help; last dose yesterday evening.   Past Surgical History:  Procedure Laterality Date   ANTERIOR CERVICAL DECOMP/DISCECTOMY  FUSION  ~ 1991   "took bone from hip"   ANTERIOR CERVICAL DECOMP/DISCECTOMY FUSION  X 2   "put plate in"   ANTERIOR CERVICAL DISCECTOMY  <11/2005    5-6 and 4-5./notes 05/02/2011   APPENDECTOMY     BACK SURGERY     BIOPSY  05/30/2018   Procedure: BIOPSY;  Surgeon: Ruby Corporal, MD;  [facility]: [facility];  Service: Endoscopy;;  esophagus   BIOPSY  01/09/2022   Procedure: BIOPSY;  Surgeon: Urban Garden, MD;  Location: [facility];  Service: Gastroenterology;;   CARDIAC CATHETERIZATION     3-4 per pt   CARPAL TUNNEL RELEASE Bilateral 10/2005 - 11/2005   right-left /notes 05/02/2011   CATARACT EXTRACTION W/PHACO Right 05/24/2014   Procedure: CATARACT EXTRACTION PHACO AND INTRAOCULAR LENS PLACEMENT (IOC);  Surgeon: Anner Kill, MD;  Location: [facility];  Service: Ophthalmology;  Laterality: Right;  CDE 6.74   CATARACT EXTRACTION W/PHACO Left 06/17/2014   Procedure: CATARACT EXTRACTION PHACO AND INTRAOCULAR LENS PLACEMENT (IOC);  Surgeon: Anner Kill, MD;  Location: [facility];  Service: Ophthalmology;  Laterality: Left;  CDE:4.65   COLONOSCOPY WITH ESOPHAGOGASTRODUODENOSCOPY (EGD) N/A 01/22/2013   Procedure: COLONOSCOPY WITH ESOPHAGOGASTRODUODENOSCOPY (EGD);  Surgeon: Ruby Corporal, MD;  [facility]: [facility];  Service: Endoscopy;  Laterality: N/A;  140   COLONOSCOPY WITH PROPOFOL  N/A 05/30/2018   diverticulosis and external hemorrhoids   CORONARY ARTERY BYPASS GRAFT  2020   CYSTOSCOPY WITH INSERTION OF UROLIFT N/A 04/16/2022   Procedure: CYSTOSCOPY WITH INSERTION OF UROLIFT;  Surgeon: Marco Severs, MD;  Location: [facility];  Service: Urology;  Laterality: N/A;   ESOPHAGOGASTRODUODENOSCOPY (EGD) WITH PROPOFOL  N/A 05/30/2018   normal esophagus, presence of possible barretts (bx consistent with reflux changes, GAVE)   ESOPHAGOGASTRODUODENOSCOPY (EGD) WITH PROPOFOL  N/A 01/09/2022   Procedure: ESOPHAGOGASTRODUODENOSCOPY (EGD) WITH PROPOFOL ;  Surgeon: Urban Garden,  MD;  [facility]: [facility];  Service: Gastroenterology;  Laterality: N/A;  305, moved up per Leigh Amm   EYE SURGERY Bilateral    "cleaned film w/laser; since my 1st cataract OR"   JOINT REPLACEMENT     LEFT HEART CATH AND CORONARY ANGIOGRAPHY N/A 02/16/2019   Procedure: LEFT HEART CATH AND CORONARY ANGIOGRAPHY;  Surgeon: Arty Binning, MD;  [facility];  Service: Cardiovascular;  Laterality: N/A;   POSTERIOR LUMBAR FUSION     "put a plate in"   QUADRICEPS TENDON REPAIR Left 07/01/2017   QUADRICEPS TENDON REPAIR Left 07/01/2017   Procedure: REPAIR QUADRICEP TENDON;  Surgeon: Christie Cox, MD;  Location: [facility] OR;  Service: Orthopedics;  Laterality: Left;   SHOULDER ARTHROSCOPY Bilateral    SPINAL CORD STIMULATOR INSERTION N/A 09/23/2015   Procedure: LUMBAR SPINAL CORD STIMULATOR INSERTION;  Surgeon: Gerri Kras, MD;  [facility]: [facility];  Service: Neurosurgery;  Laterality: N/A;  LUMBAR SPINAL CORD STIMULATOR INSERTION   SPINAL CORD STIMULATOR INSERTION N/A 04/25/2022   Procedure: REVISION OF  SPINAL CORD STIMULATOR;  Surgeon: Garry Kansas, MD;  Location: [facility] OR;  Service: Neurosurgery;  Laterality: N/A;   TOTAL KNEE ARTHROPLASTY Left 06/17/2017   TOTAL KNEE ARTHROPLASTY Left 06/17/2017   Procedure: TOTAL KNEE ARTHROPLASTY;  Surgeon: Christie Cox, MD;  [facility]: [facility];  Service: Orthopedics;  Laterality: Left;   TOTAL KNEE ARTHROPLASTY Right 12/16/2017   Procedure: TOTAL KNEE ARTHROPLASTY;  Surgeon: Christie Cox, MD;  [facility]: [facility];  Service: Orthopedics;  Laterality: Right;      OBJECTIVE:  Vitals:   04/28/24 1055  BP: 103/66  Pulse: (!) 51  Resp: 20  Temp: (!) 97.5 F (36.4 C)  TempSrc: Oral  SpO2: 95%    General appearance: alert; no distress HEENT: [place]; AT Neck: supple with FROM Resp: unlabored respirations Back: TTP over R thoracic musculature/flank Extremities: moves LE normally Skin: warm and dry; no visible rashes Neurologic: gait normal;  normal sensation and strength of bilateral LE Psychological: alert and cooperative; normal mood and affect  Imaging: No results found.    Allergies  Allergen Reactions   Tylenol  [Acetaminophen ] Other (See Comments)    Stomach pain    Morphine And Codeine Nausea And Vomiting   Propofol  Nausea And Vomiting    Past Medical History:  Diagnosis Date   A-fib (HCC)    Arthritis    "all over my body" (06/17/2017)   BPH (benign prostatic hyperplasia)    takes Proscar  daily   CAD (coronary artery disease)    a. s/p CABG in 09/2019 with SVG-PDA-OM and LIMA-LAD with LAA excision   Chronic back pain    DDD; "upper and lower" (06/17/2017)   Constipation    takes Colace daily   Coronary artery disease    DDD (degenerative disc disease), cervical    DVT (deep venous thrombosis) (HCC)    left peroneal vein 07/2017 (in setting of recent left TKA)   Dysrhythmia    history of Atrial fibrillation, no long has this   Enlarged prostate    GERD (gastroesophageal reflux disease)    takes Nexium daily   H/O hiatal  hernia    History of colon polyps    benign   History of gastric ulcer    Insomnia    takes Xanax  nightly   Joint capsule tear    left knee   Joint pain    Joint swelling    Macular degeneration    Muscle spasm of back    takes Valium  daily as needed   PAF (paroxysmal atrial fibrillation) (HCC)    Peripheral neuropathy    takes Gabapentin  daily   PONV (postoperative nausea and vomiting)    Pre-diabetes    Sleep apnea    pt does not wear a cpap   Urinary hesitancy    Weakness    numbness and tingling   Social History   Socioeconomic History   Marital status: Divorced    Spouse name: Not on file   Number of children: 3   Years of education: Not on file   Highest education level: Not on file  Occupational History   Not on file  Tobacco Use   Smoking status: Former    Current packs/day: 0.00    Average packs/day: 1 pack/day for 30.0 years (30.0 ttl pk-yrs)    Types:  Cigarettes    Start date: 01/14/1957    Quit date: 01/14/1987    Years since quitting: 37.3   Smokeless tobacco: Former    Types: Chew   Tobacco comments:    quit smoking  & chewing by 1987  Vaping Use   Vaping status: Never Used  Substance and Sexual Activity   Alcohol use: Yes    Comment: 2-3 beers per night   Drug use: No   Sexual activity: Not Currently  Other Topics Concern   Not on file  Social History Narrative   Not on file   Social Drivers of Health   Financial Resource Strain: Low Risk  (02/27/2023)   Received from [facility], [facility]   Overall Financial Resource Strain (CARDIA)    Difficulty of Paying Living Expenses: Not hard at all  Food Insecurity: No Food Insecurity (02/27/2023)   Received from [facility], [facility]   Hunger Vital Sign    Worried About Running Out of Food in the Last Year: Never true    Ran Out of Food in the Last Year: Never true  Transportation Needs: No Transportation Needs (02/27/2023)   Received from [facility], [facility]   PRAPARE - Transportation    Lack of Transportation (Medical): No    Lack of Transportation (Non-Medical): No  Physical Activity: Not on file  Stress: Not on file  Social Connections: Unknown (09/24/2022)   Received from [facility], [facility]   Social Network    Social Network: Not on file   History reviewed. No pertinent family history. Past Surgical History:  Procedure Laterality Date   ANTERIOR CERVICAL DECOMP/DISCECTOMY FUSION  ~ 1991   "took bone from hip"   ANTERIOR CERVICAL DECOMP/DISCECTOMY FUSION  X 2   "put plate in"   ANTERIOR CERVICAL DISCECTOMY  <11/2005    5-6 and 4-5./notes 05/02/2011   APPENDECTOMY     BACK SURGERY     BIOPSY  05/30/2018   Procedure: BIOPSY;  Surgeon: Ruby Corporal, MD;  [facility];  Service: Endoscopy;;  esophagus   BIOPSY  01/09/2022   Procedure: BIOPSY;  Surgeon: Urban Garden, MD;  [facility];  Service:  Gastroenterology;;   CARDIAC CATHETERIZATION     3-4 per pt  CARPAL TUNNEL RELEASE Bilateral 10/2005 - 11/2005   right-left /notes 05/02/2011   CATARACT EXTRACTION W/PHACO Right 05/24/2014   Procedure: CATARACT EXTRACTION PHACO AND INTRAOCULAR LENS PLACEMENT (IOC);  Surgeon: Anner Kill, MD;  Location: [facility];  Service: Ophthalmology;  Laterality: Right;  CDE 6.74   CATARACT EXTRACTION W/PHACO Left 06/17/2014   Procedure: CATARACT EXTRACTION PHACO AND INTRAOCULAR LENS PLACEMENT (IOC);  Surgeon: Anner Kill, MD;  Location: [facility];  Service: Ophthalmology;  Laterality: Left;  CDE:4.65   COLONOSCOPY WITH ESOPHAGOGASTRODUODENOSCOPY (EGD) N/A 01/22/2013   Procedure: COLONOSCOPY WITH ESOPHAGOGASTRODUODENOSCOPY (EGD);  Surgeon: Ruby Corporal, MD;  [facility];  Service: Endoscopy;  Laterality: N/A;  140   COLONOSCOPY WITH PROPOFOL  N/A 05/30/2018   diverticulosis and external hemorrhoids   CORONARY ARTERY BYPASS GRAFT  2020   CYSTOSCOPY WITH INSERTION OF UROLIFT N/A 04/16/2022   Procedure: CYSTOSCOPY WITH INSERTION OF UROLIFT;  Surgeon: Marco Severs, MD;  Location: [facility];  Service: Urology;  Laterality: N/A;   ESOPHAGOGASTRODUODENOSCOPY (EGD) WITH PROPOFOL  N/A 05/30/2018   normal esophagus, presence of possible barretts (bx consistent with reflux changes, GAVE)   ESOPHAGOGASTRODUODENOSCOPY (EGD) WITH PROPOFOL  N/A 01/09/2022   Procedure: ESOPHAGOGASTRODUODENOSCOPY (EGD) WITH PROPOFOL ;  Surgeon: Urban Garden, MD;  [facility]: [facility];  Service: Gastroenterology;  Laterality: N/A;  305, moved up per Leigh Amm   EYE SURGERY Bilateral    "cleaned film w/laser; since my 1st cataract OR"   JOINT REPLACEMENT     LEFT HEART CATH AND CORONARY ANGIOGRAPHY N/A 02/16/2019   Procedure: LEFT HEART CATH AND CORONARY ANGIOGRAPHY;  Surgeon: Arty Binning, MD;  [facility];  Service: Cardiovascular;  Laterality: N/A;   POSTERIOR LUMBAR FUSION     "put a plate in"    QUADRICEPS TENDON REPAIR Left 07/01/2017   QUADRICEPS TENDON REPAIR Left 07/01/2017   Procedure: REPAIR QUADRICEP TENDON;  Surgeon: Christie Cox, MD;  Location: [facility];  Service: Orthopedics;  Laterality: Left;   SHOULDER ARTHROSCOPY Bilateral    SPINAL CORD STIMULATOR INSERTION N/A 09/23/2015   Procedure: LUMBAR SPINAL CORD STIMULATOR INSERTION;  Surgeon: Gerri Kras, MD;  [facility];  Service: Neurosurgery;  Laterality: N/A;  LUMBAR SPINAL CORD STIMULATOR INSERTION   SPINAL CORD STIMULATOR INSERTION N/A 04/25/2022   Procedure: REVISION OF  SPINAL CORD STIMULATOR;  Surgeon: Garry Kansas, MD;  Location: [facility] OR;  Service: Neurosurgery;  Laterality: N/A;   TOTAL KNEE ARTHROPLASTY Left 06/17/2017   TOTAL KNEE ARTHROPLASTY Left 06/17/2017   Procedure: TOTAL KNEE ARTHROPLASTY;  Surgeon: Christie Cox, MD;  [facility];  Service: Orthopedics;  Laterality: Left;   TOTAL KNEE ARTHROPLASTY Right 12/16/2017   Procedure: TOTAL KNEE ARTHROPLASTY;  Surgeon: Christie Cox, MD;  [facility];  Service: Orthopedics;  Laterality: Right;       Afton Albright, MD 04/28/24 1149    Afton Albright, MD 04/28/24 1153

## 2024-04-29 DIAGNOSIS — R079 Chest pain, unspecified: Secondary | ICD-10-CM | POA: Diagnosis not present

## 2024-04-29 DIAGNOSIS — K8020 Calculus of gallbladder without cholecystitis without obstruction: Secondary | ICD-10-CM | POA: Diagnosis not present

## 2024-04-29 DIAGNOSIS — R109 Unspecified abdominal pain: Secondary | ICD-10-CM | POA: Diagnosis not present

## 2024-04-30 ENCOUNTER — Telehealth: Payer: Self-pay | Admitting: Urology

## 2024-04-30 DIAGNOSIS — N200 Calculus of kidney: Secondary | ICD-10-CM

## 2024-04-30 NOTE — Telephone Encounter (Signed)
 Went to [facility] 5/13 and yesterday to [facility] and said he needs to be seen today. Please call wife with advice.

## 2024-04-30 NOTE — Telephone Encounter (Signed)
 Spoke with patient. Patient scheduled tomorrow with KUB before.

## 2024-05-01 ENCOUNTER — Ambulatory Visit (HOSPITAL_COMMUNITY)
Admission: RE | Admit: 2024-05-01 | Discharge: 2024-05-01 | Disposition: A | Discharge status: Home / Self Care | Source: Ambulatory Visit | Attending: Urology | Admitting: Urology

## 2024-05-01 ENCOUNTER — Encounter: Payer: Self-pay | Admitting: Urology

## 2024-05-01 ENCOUNTER — Ambulatory Visit: Admitting: Urology

## 2024-05-01 VITALS — BP 129/69 | HR 47

## 2024-05-01 DIAGNOSIS — N200 Calculus of kidney: Secondary | ICD-10-CM | POA: Diagnosis not present

## 2024-05-01 DIAGNOSIS — Z981 Arthrodesis status: Secondary | ICD-10-CM | POA: Diagnosis not present

## 2024-05-01 DIAGNOSIS — I878 Other specified disorders of veins: Secondary | ICD-10-CM | POA: Diagnosis not present

## 2024-05-01 LAB — URINALYSIS, ROUTINE W REFLEX MICROSCOPIC
Bilirubin, UA: NEGATIVE
Glucose, UA: NEGATIVE
Ketones, UA: NEGATIVE
Leukocytes,UA: NEGATIVE
Nitrite, UA: NEGATIVE
Protein,UA: NEGATIVE
RBC, UA: NEGATIVE
Specific Gravity, UA: 1.02 (ref 1.005–1.030)
Urobilinogen, Ur: 0.2 mg/dL (ref 0.2–1.0)
pH, UA: 6 (ref 5.0–7.5)

## 2024-05-01 MED ORDER — MAGNESIUM CITRATE PO SOLN: 1.0000 | Freq: Once | ORAL | 2 refills | Status: DC

## 2024-05-01 NOTE — Progress Notes (Signed)
 05/01/2024 9:33 AM   Lucilla Saa [birth year] [id number]  Referring provider: Minus Amel, MD [address] [place],  [place] [address]  nephrolithiasis   HPI: Mr Vankampen is a 78yo here for followup for nephrolithiasis. He was seen in the ER on 04/28/2024 for abdominal pain and was noted to severely constipation. CT showed a 3mm right renal calculus which is seen today on KUB. He continues to have intermittent right flank pain.    PMH: Past Medical History:  Diagnosis Date   A-fib [facility])    Arthritis    "all over my body" (06/17/2017)   BPH (benign prostatic hyperplasia)    takes Proscar  daily   CAD (coronary artery disease)    a. s/p CABG in 09/2019 with SVG-PDA-OM and LIMA-LAD with LAA excision   Chronic back pain    DDD; "upper and lower" (06/17/2017)   Constipation    takes Colace daily   Coronary artery disease    DDD (degenerative disc disease), cervical    DVT (deep venous thrombosis) (HCC)    left peroneal vein 07/2017 (in setting of recent left TKA)   Dysrhythmia    history of Atrial fibrillation, no long has this   Enlarged prostate    GERD (gastroesophageal reflux disease)    takes Nexium daily   H/O hiatal hernia    History of colon polyps    benign   History of gastric ulcer    Insomnia    takes Xanax  nightly   Joint capsule tear    left knee   Joint pain    Joint swelling    Macular degeneration    Muscle spasm of back    takes Valium  daily as needed   PAF (paroxysmal atrial fibrillation) (HCC)    Peripheral neuropathy    takes Gabapentin  daily   PONV (postoperative nausea and vomiting)    Pre-diabetes    Sleep apnea    pt does not wear a cpap   Urinary hesitancy    Weakness    numbness and tingling    Surgical History: Past Surgical History:  Procedure Laterality Date   ANTERIOR CERVICAL DECOMP/DISCECTOMY FUSION  ~ 1991   "took bone from hip"   ANTERIOR CERVICAL DECOMP/DISCECTOMY FUSION  X 2   "put plate in"   ANTERIOR CERVICAL  DISCECTOMY  <11/2005    5-6 and 4-5./notes 05/02/2011   APPENDECTOMY     BACK SURGERY     BIOPSY  05/30/2018   Procedure: BIOPSY;  Surgeon: Ruby Corporal, MD;  [facility];  Service: Endoscopy;;  esophagus   BIOPSY  01/09/2022   Procedure: BIOPSY;  Surgeon: Urban Garden, MD;  [facility];  Service: Gastroenterology;;   CARDIAC CATHETERIZATION     3-4 per pt   CARPAL TUNNEL RELEASE Bilateral 10/2005 - 11/2005   right-left /notes 05/02/2011   CATARACT EXTRACTION W/PHACO Right 05/24/2014   Procedure: CATARACT EXTRACTION PHACO AND INTRAOCULAR LENS PLACEMENT (IOC);  Surgeon: Anner Kill, MD;  Location: [facility];  Service: Ophthalmology;  Laterality: Right;  CDE 6.74   CATARACT EXTRACTION W/PHACO Left 06/17/2014   Procedure: CATARACT EXTRACTION PHACO AND INTRAOCULAR LENS PLACEMENT (IOC);  Surgeon: Anner Kill, MD;  Location: [facility];  Service: Ophthalmology;  Laterality: Left;  CDE:4.65   COLONOSCOPY WITH ESOPHAGOGASTRODUODENOSCOPY (EGD) N/A 01/22/2013   Procedure: COLONOSCOPY WITH ESOPHAGOGASTRODUODENOSCOPY (EGD);  Surgeon: Ruby Corporal, MD;  [facility];  Service: Endoscopy;  Laterality: N/A;  140   COLONOSCOPY WITH  PROPOFOL  N/A 05/30/2018   diverticulosis and external hemorrhoids   CORONARY ARTERY BYPASS GRAFT  2020   CYSTOSCOPY WITH INSERTION OF UROLIFT N/A 04/16/2022   Procedure: CYSTOSCOPY WITH INSERTION OF UROLIFT;  Surgeon: Marco Severs, MD;  Location: [facility];  Service: Urology;  Laterality: N/A;   ESOPHAGOGASTRODUODENOSCOPY (EGD) WITH PROPOFOL  N/A 05/30/2018   normal esophagus, presence of possible barretts (bx consistent with reflux changes, GAVE)   ESOPHAGOGASTRODUODENOSCOPY (EGD) WITH PROPOFOL  N/A 01/09/2022   Procedure: ESOPHAGOGASTRODUODENOSCOPY (EGD) WITH PROPOFOL ;  Surgeon: Urban Garden, MD;  [facility];  Service: Gastroenterology;  Laterality: N/A;  305, moved up per Leigh Amm   EYE SURGERY Bilateral     "cleaned film w/laser; since my 1st cataract OR"   JOINT REPLACEMENT     LEFT HEART CATH AND CORONARY ANGIOGRAPHY N/A 02/16/2019   Procedure: LEFT HEART CATH AND CORONARY ANGIOGRAPHY;  Surgeon: Arty Binning, MD;  [facility];  Service: Cardiovascular;  Laterality: N/A;   POSTERIOR LUMBAR FUSION     "put a plate in"   QUADRICEPS TENDON REPAIR Left 07/01/2017   QUADRICEPS TENDON REPAIR Left 07/01/2017   Procedure: REPAIR QUADRICEP TENDON;  Surgeon: Christie Cox, MD;  Location: [facility];  Service: Orthopedics;  Laterality: Left;   SHOULDER ARTHROSCOPY Bilateral    SPINAL CORD STIMULATOR INSERTION N/A 09/23/2015   Procedure: LUMBAR SPINAL CORD STIMULATOR INSERTION;  Surgeon: Gerri Kras, MD;  [facility];  Service: Neurosurgery;  Laterality: N/A;  LUMBAR SPINAL CORD STIMULATOR INSERTION   SPINAL CORD STIMULATOR INSERTION N/A 04/25/2022   Procedure: REVISION OF  SPINAL CORD STIMULATOR;  Surgeon: Garry Kansas, MD;  Location: [facility];  Service: Neurosurgery;  Laterality: N/A;   TOTAL KNEE ARTHROPLASTY Left 06/17/2017   TOTAL KNEE ARTHROPLASTY Left 06/17/2017   Procedure: TOTAL KNEE ARTHROPLASTY;  Surgeon: Christie Cox, MD;  [facility]: [facility];  Service: Orthopedics;  Laterality: Left;   TOTAL KNEE ARTHROPLASTY Right 12/16/2017   Procedure: TOTAL KNEE ARTHROPLASTY;  Surgeon: Christie Cox, MD;  Location: [facility];  Service: Orthopedics;  Laterality: Right;    Home Medications:  Allergies as of 05/01/2024       Reactions   Tylenol  [acetaminophen ] Other (See Comments)   Stomach pain    Morphine And Codeine Nausea And Vomiting   Propofol  Nausea And Vomiting        Medication List        Accurate as of May 01, 2024  9:33 AM. If you have any questions, ask your nurse or doctor.          ALPRAZolam  1 MG tablet Commonly known as: XANAX  Take 1 mg by mouth at bedtime.   amiodarone  200 MG tablet Commonly known as: PACERONE  TAKE 1 TABLET BY MOUTH EVERY DAY    apixaban  5 MG Tabs tablet Commonly known as: ELIQUIS  Take 1 tablet (5 mg total) by mouth 2 (two) times daily. *Restart 04/28/2022* What changed: additional instructions   ascorbic acid 500 MG tablet Commonly known as: VITAMIN C Take 500 mg by mouth daily.   aspirin  EC 81 MG tablet Take 1 tablet (81 mg total) by mouth daily.   atorvastatin  80 MG tablet Commonly known as: LIPITOR  TAKE 1 TABLET BY MOUTH EVERY DAY   B-12 PO Take 1 tablet by mouth daily.   docusate sodium  250 MG capsule Commonly known as: COLACE Take 250 mg by mouth daily.   doxycycline  100 MG capsule Commonly known as: VIBRAMYCIN  TAKE 1 CAPSULE BY MOUTH  EVERY 12 HOURS   FIBER-CAPS PO Take 2 capsules by mouth daily.   finasteride  5 MG tablet Commonly known as: PROSCAR  TAKE 1 TABLET (5 MG TOTAL) BY MOUTH DAILY.   furosemide  20 MG tablet Commonly known as: LASIX  Take 1 tablet (20 mg total) by mouth daily.   gabapentin  400 MG capsule Commonly known as: NEURONTIN  One three times a day What changed:  how much to take how to take this when to take this additional instructions   isosorbide  mononitrate 60 MG 24 hr tablet Commonly known as: IMDUR  Take 60 mg by mouth daily.   lidocaine  5 % Commonly known as: Lidoderm  Place 1 patch onto the skin daily. Remove & Discard patch within 12 hours or as directed by MD   nitroGLYCERIN  0.4 MG SL tablet Commonly known as: NITROSTAT  Place 1 tablet (0.4 mg total) under the tongue every 5 (five) minutes as needed for chest pain.   Omega-3 1000 MG Caps Take 1,000 mg by mouth daily.   pantoprazole  40 MG tablet Commonly known as: PROTONIX  TAKE 1 TABLET BY MOUTH TWICE A DAY (30 TO 60 MINUTES BEFORE 1ST AND LAST MEALS OF THE DAY) What changed:  how much to take when to take this additional instructions   polyethylene glycol powder 17 GM/SCOOP powder Commonly known as: GLYCOLAX /MIRALAX  Take 17 g by mouth in the morning and at bedtime.   potassium chloride  10  MEQ tablet Commonly known as: KLOR-CON  M Take 10 mEq by mouth daily.   silodosin  8 MG Caps capsule Commonly known as: RAPAFLO  TAKE 1 CAPSULE (8 MG TOTAL) BY MOUTH AT BEDTIME.   Simethicone  180 MG Caps Take 1 capsule by mouth 3 (three) times daily.   sulfamethoxazole -trimethoprim  800-160 MG tablet Commonly known as: BACTRIM  DS TAKE 1 TABLET BY MOUTH EVERY 12 HOURS   traMADol  50 MG tablet Commonly known as: ULTRAM  Take 1-2 tablets (50-100 mg total) by mouth every 4 (four) hours as needed.   trimethoprim  100 MG tablet Commonly known as: TRIMPEX  Take 1 tablet (100 mg total) by mouth at bedtime.   Vitamin D3 10 MCG (400 UNIT) Caps Take 400 Units by mouth daily.   vitamin E 200 UNIT capsule Take 200 Units by mouth daily.   ZINC  15 PO Take 15 mg by mouth daily.        Allergies:  Allergies  Allergen Reactions   Tylenol  [Acetaminophen ] Other (See Comments)    Stomach pain    Morphine And Codeine Nausea And Vomiting   Propofol  Nausea And Vomiting    Family History: No family history on file.  Social History:  reports that he quit smoking about 37 years ago. His smoking use included cigarettes. He started smoking about 67 years ago. He has a 30 pack-year smoking history. He has quit using smokeless tobacco.  His smokeless tobacco use included chew. He reports current alcohol use. He reports that he does not use drugs.  ROS: All other review of systems were reviewed and are negative except what is noted above in HPI  Physical Exam: BP 129/69   Pulse (!) 47   Constitutional:  Alert and oriented, No acute distress. HEENT: [place] AT, moist mucus membranes.  Trachea midline, no masses. Cardiovascular: No clubbing, cyanosis, or edema. Respiratory: Normal respiratory effort, no increased work of breathing. GI: Abdomen is soft, nontender, nondistended, no abdominal masses GU: No CVA tenderness.  Lymph: No cervical or inguinal lymphadenopathy. Skin: No rashes, bruises or  suspicious lesions. Neurologic: Grossly intact, no focal  deficits, moving all 4 extremities. Psychiatric: Normal mood and affect.  Laboratory Data: Lab Results  Component Value Date   WBC 4.7 04/28/2024   HGB 13.1 04/28/2024   HCT 40.1 04/28/2024   MCV 92.8 04/28/2024   PLT 225 04/28/2024    Lab Results  Component Value Date   CREATININE 1.11 04/28/2024    No results found for: "PSA"  No results found for: "TESTOSTERONE"  Lab Results  Component Value Date   HGBA1C 5.6 12/31/2020    Urinalysis    Component Value Date/Time   COLORURINE YELLOW 04/28/2024 1321   APPEARANCEUR CLEAR 04/28/2024 1321   APPEARANCEUR Clear 02/21/2024 1003   LABSPEC 1.017 04/28/2024 1321   PHURINE 5.5 04/28/2024 1321   GLUCOSEU NEGATIVE 04/28/2024 1321   HGBUR NEGATIVE 04/28/2024 1321   BILIRUBINUR NEGATIVE 04/28/2024 1321   BILIRUBINUR negative 04/28/2024 1105   BILIRUBINUR Negative 02/21/2024 1003   KETONESUR NEGATIVE 04/28/2024 1321   KETONESUR negative 04/28/2024 1105   PROTEINUR NEGATIVE 04/28/2024 1321   PROTEINUR negative 04/28/2024 1105   UROBILINOGEN 0.2 04/28/2024 1105   UROBILINOGEN 0.2 03/07/2014 2103   NITRITE NEGATIVE 04/28/2024 1321   NITRITE Negative 04/28/2024 1105   LEUKOCYTESUR NEGATIVE 04/28/2024 1321   LEUKOCYTESUR Negative 04/28/2024 1105    Lab Results  Component Value Date   LABMICR Comment 02/21/2024   WBCUA >30 (A) 11/01/2023   LABEPIT 0-10 11/01/2023   MUCUS Present 08/31/2022   BACTERIA Many (A) 11/01/2023    Pertinent Imaging: CT 04/28/24 and KUB today: Images reviewed and discussed with the patient  No results found for this or any previous visit.  No results found for this or any previous visit.  No results found for this or any previous visit.  No results found for this or any previous visit.  Results for orders placed during the hospital encounter of 11/25/23  US  RENAL  Narrative CLINICAL DATA:  Nephrolithiasis  EXAM: RENAL / URINARY  TRACT ULTRASOUND COMPLETE  COMPARISON:  Ultrasound abdomen 08/20/2022  FINDINGS: Right Kidney:  Renal measurements: 10.9 x 4.6 x 5.9 cm = volume: 156 mL. Echogenicity within normal limits. No mass or hydronephrosis visualized.  Left Kidney:  Renal measurements: 9.9 x 5.2 x 5.9 cm = volume: 160 mL. Echogenicity within normal limits. No mass or hydronephrosis visualized.  Bladder:  Appears normal for degree of bladder distention.  Other:  None.  IMPRESSION: No hydronephrosis.   Electronically Signed By: Jone Neither M.D. On: 11/25/2023 12:10  No results found for this or any previous visit.  No results found for this or any previous visit.  Results for orders placed during the hospital encounter of 04/28/24  CT Renal Stone Study  Narrative CLINICAL DATA:  Abdominal/flank pain  EXAM: CT ABDOMEN AND PELVIS WITHOUT CONTRAST  TECHNIQUE: Multidetector CT imaging of the abdomen and pelvis was performed following the standard protocol without IV contrast.  RADIATION DOSE REDUCTION: This exam was performed according to the departmental dose-optimization program which includes automated exposure control, adjustment of the mA and/or kV according to patient size and/or use of iterative reconstruction technique.  COMPARISON:  None Available.  FINDINGS: Lower chest: Nothing significant.  Hepatobiliary: Gallstones.  Pancreas: Unremarkable. No pancreatic ductal dilatation or surrounding inflammatory changes.  Spleen: Normal in size without focal abnormality.  Adrenals/Urinary Tract: A nonobstructing 3 mm stone is present in the midpole of the right kidney. No hydronephrosis. The urinary bladder is grossly unremarkable.  Stomach/Bowel: The stomach is nondilated. No dilated loops of small bowel are appreciated.  A large volume of stool material is present in the colon. The appendix is not clearly seen but there is no evidence of significant inflammatory change in  the right lower quadrant.  Vascular/Lymphatic: No significant vascular findings are present. No enlarged abdominal or pelvic lymph nodes.  Reproductive: Post treatment changes are present in the prostatic bed.  Other: Diastasis of the ventral abdominal wall. Implantable device overlies the soft tissues of the left flank.  Musculoskeletal: Degenerative changes are present in the lumbar spine with postsurgical changes from posterior lumbar fusion of L4-L5.  IMPRESSION: 1. A large volume of stool material is present throughout the colon. Correlate clinically for signs of constipation. 2. Right-sided nephrolithiasis without evidence of obstruction or hydronephrosis. 3. Gallstones.   Electronically Signed By: Reagan Camera M.D. On: 04/28/2024 15:09   Assessment & Plan:    1. Kidney stone (Primary) -We discussed the management of kidney stones. These options include observation, ureteroscopy, shockwave lithotripsy (ESWL) and percutaneous nephrolithotomy (PCNL). We discussed which options are relevant to the patient's stone(s). We discussed the natural history of kidney stones as well as the complications of untreated stones and the impact on quality of life without treatment as well as with each of the above listed treatments. We also discussed the efficacy of each treatment in its ability to clear the stone burden. With any of these management options I discussed the signs and symptoms of infection and the need for emergent treatment should these be experienced. For each option we discussed the ability of each procedure to clear the patient of their stone burden.   For observation I described the risks which include but are not limited to silent renal damage, life-threatening infection, need for emergent surgery, failure to pass stone and pain.   For ureteroscopy I described the risks which include bleeding, infection, damage to contiguous structures, positioning injury, ureteral  stricture, ureteral avulsion, ureteral injury, need for prolonged ureteral stent, inability to perform ureteroscopy, need for an interval procedure, inability to clear stone burden, stent discomfort/pain, heart attack, stroke, pulmonary embolus and the inherent risks with general anesthesia.   For shockwave lithotripsy I described the risks which include arrhythmia, kidney contusion, kidney hemorrhage, need for transfusion, pain, inability to adequately break up stone, inability to pass stone fragments, Steinstrasse, infection associated with obstructing stones, need for alternate surgical procedure, need for repeat shockwave lithotripsy, MI, CVA, PE and the inherent risks with anesthesia/conscious sedation.   For PCNL I described the risks including positioning injury, pneumothorax, hydrothorax, need for chest tube, inability to clear stone burden, renal laceration, arterial venous fistula or malformation, need for embolization of kidney, loss of kidney or renal function, need for repeat procedure, need for prolonged nephrostomy tube, ureteral avulsion, MI, CVA, PE and the inherent risks of general anesthesia.   - The patient would like to proceed with observation. Followup 3 months with KUB - Urinalysis, Routine w reflex microscopic   No follow-ups on file.  Johnie Nailer, MD  [facility] [place]

## 2024-05-01 NOTE — Patient Instructions (Signed)

## 2024-05-04 ENCOUNTER — Telehealth: Payer: Self-pay | Admitting: Urology

## 2024-05-04 ENCOUNTER — Other Ambulatory Visit: Payer: Self-pay

## 2024-05-04 MED ORDER — MAGNESIUM CITRATE PO SOLN
1.0000 | ORAL | 2 refills | Status: AC | PRN
Start: 2024-05-04 — End: ?

## 2024-05-04 NOTE — Telephone Encounter (Signed)
 Says Christopher [name] was supposed to call in a liquid for constipation when he was here on 5/16.

## 2024-05-04 NOTE — Telephone Encounter (Signed)
 Patient made aware medication was resent to pharmacy.

## 2024-05-19 ENCOUNTER — Other Ambulatory Visit: Payer: Self-pay | Admitting: Urology

## 2024-05-19 DIAGNOSIS — N401 Enlarged prostate with lower urinary tract symptoms: Secondary | ICD-10-CM

## 2024-05-19 DIAGNOSIS — R8290 Unspecified abnormal findings in urine: Secondary | ICD-10-CM

## 2024-05-19 DIAGNOSIS — M545 Low back pain, unspecified: Secondary | ICD-10-CM

## 2024-05-19 DIAGNOSIS — Z8619 Personal history of other infectious and parasitic diseases: Secondary | ICD-10-CM

## 2024-05-20 ENCOUNTER — Other Ambulatory Visit: Payer: Self-pay | Admitting: Urology

## 2024-06-12 ENCOUNTER — Ambulatory Visit: Admitting: Urology

## 2024-07-19 ENCOUNTER — Other Ambulatory Visit: Payer: Self-pay | Admitting: Urology

## 2024-08-13 DIAGNOSIS — M546 Pain in thoracic spine: Secondary | ICD-10-CM | POA: Diagnosis not present

## 2024-08-13 DIAGNOSIS — M5416 Radiculopathy, lumbar region: Secondary | ICD-10-CM | POA: Diagnosis not present

## 2024-08-13 DIAGNOSIS — M542 Cervicalgia: Secondary | ICD-10-CM | POA: Diagnosis not present

## 2024-08-26 ENCOUNTER — Other Ambulatory Visit: Payer: Self-pay | Admitting: Student

## 2024-08-26 DIAGNOSIS — M542 Cervicalgia: Secondary | ICD-10-CM

## 2024-08-26 DIAGNOSIS — M5416 Radiculopathy, lumbar region: Secondary | ICD-10-CM

## 2024-08-26 DIAGNOSIS — M546 Pain in thoracic spine: Secondary | ICD-10-CM

## 2024-09-01 ENCOUNTER — Telehealth: Payer: Self-pay | Admitting: "Endocrinology

## 2024-09-01 DIAGNOSIS — E0590 Thyrotoxicosis, unspecified without thyrotoxic crisis or storm: Secondary | ICD-10-CM

## 2024-09-01 DIAGNOSIS — R7989 Other specified abnormal findings of blood chemistry: Secondary | ICD-10-CM

## 2024-09-01 NOTE — Telephone Encounter (Signed)
Lab orders updated and sent to [place].

## 2024-09-01 NOTE — Telephone Encounter (Signed)
 Pt needs labs updated

## 2024-09-09 NOTE — Discharge Instructions (Signed)

## 2024-09-10 ENCOUNTER — Ambulatory Visit
Admission: RE | Admit: 2024-09-10 | Discharge: 2024-09-10 | Disposition: A | Discharge status: Home / Self Care | Source: Ambulatory Visit | Attending: Student | Admitting: Student

## 2024-09-10 ENCOUNTER — Ambulatory Visit
Admission: RE | Admit: 2024-09-10 | Discharge: 2024-09-10 | Disposition: A | Discharge status: Home / Self Care | Source: Ambulatory Visit | Attending: Student

## 2024-09-10 ENCOUNTER — Ambulatory Visit: Payer: Self-pay

## 2024-09-10 ENCOUNTER — Ambulatory Visit: Payer: Medicare Other | Admitting: "Endocrinology

## 2024-09-10 DIAGNOSIS — M542 Cervicalgia: Secondary | ICD-10-CM

## 2024-09-10 DIAGNOSIS — M5124 Other intervertebral disc displacement, thoracic region: Secondary | ICD-10-CM | POA: Diagnosis not present

## 2024-09-10 DIAGNOSIS — M5020 Other cervical disc displacement, unspecified cervical region: Secondary | ICD-10-CM | POA: Diagnosis not present

## 2024-09-10 DIAGNOSIS — M4314 Spondylolisthesis, thoracic region: Secondary | ICD-10-CM | POA: Diagnosis not present

## 2024-09-10 DIAGNOSIS — M48061 Spinal stenosis, lumbar region without neurogenic claudication: Secondary | ICD-10-CM | POA: Diagnosis not present

## 2024-09-10 DIAGNOSIS — M47814 Spondylosis without myelopathy or radiculopathy, thoracic region: Secondary | ICD-10-CM | POA: Diagnosis not present

## 2024-09-10 DIAGNOSIS — M5416 Radiculopathy, lumbar region: Secondary | ICD-10-CM

## 2024-09-10 DIAGNOSIS — M5126 Other intervertebral disc displacement, lumbar region: Secondary | ICD-10-CM | POA: Diagnosis not present

## 2024-09-10 DIAGNOSIS — M546 Pain in thoracic spine: Secondary | ICD-10-CM

## 2024-09-10 DIAGNOSIS — M4802 Spinal stenosis, cervical region: Secondary | ICD-10-CM | POA: Diagnosis not present

## 2024-09-10 DIAGNOSIS — M47812 Spondylosis without myelopathy or radiculopathy, cervical region: Secondary | ICD-10-CM | POA: Diagnosis not present

## 2024-09-10 DIAGNOSIS — M5021 Other cervical disc displacement,  high cervical region: Secondary | ICD-10-CM | POA: Diagnosis not present

## 2024-09-10 MED ORDER — MEPERIDINE HCL 50 MG/ML IJ SOLN: 50.0000 mg | Freq: Once | INTRAMUSCULAR | Status: DC | PRN

## 2024-09-10 MED ORDER — IOPAMIDOL (ISOVUE-M 300) INJECTION 61%
10.0000 mL | Freq: Once | INTRAMUSCULAR | Status: AC
  Administered 2024-09-10: 10 mL via INTRATHECAL

## 2024-09-10 MED ORDER — ONDANSETRON HCL 4 MG/2ML IJ SOLN: 4.0000 mg | Freq: Once | INTRAMUSCULAR | Status: DC | PRN

## 2024-09-10 MED ORDER — DIAZEPAM 5 MG PO TABS: 5.0000 mg | ORAL_TABLET | Freq: Once | ORAL | Status: DC

## 2024-09-10 NOTE — Telephone Encounter (Signed)
 Attempted to call back patients wife. LVM with call back number. Patient has a new patient appointment in december  Copied from CRM #[id number]. Topic: Clinical - Medication Question >> Sep 10, 2024  4:26 PM Turkey B wrote: Reason for CRM: Patient's wife called in, patient has np appt on Dec 18, and is needing , the med, ALPRAZolam  (XANAX ) 1 MG tablet. [id number] - [place], [place] - [address] AT [facility]  Phone: [phone] Fax: [phone]

## 2024-09-16 ENCOUNTER — Other Ambulatory Visit (HOSPITAL_COMMUNITY)
Admission: RE | Admit: 2024-09-16 | Discharge: 2024-09-16 | Disposition: A | Discharge status: Home / Self Care | Source: Ambulatory Visit | Attending: "Endocrinology | Admitting: "Endocrinology

## 2024-09-16 DIAGNOSIS — R7989 Other specified abnormal findings of blood chemistry: Secondary | ICD-10-CM | POA: Diagnosis present

## 2024-09-16 DIAGNOSIS — E0590 Thyrotoxicosis, unspecified without thyrotoxic crisis or storm: Secondary | ICD-10-CM | POA: Diagnosis not present

## 2024-09-16 LAB — TSH: TSH: 2.85 u[IU]/mL (ref 0.350–4.500)

## 2024-09-16 LAB — T4, FREE: Free T4: 0.98 ng/dL (ref 0.61–1.12)

## 2024-09-17 LAB — T3, FREE: T3, Free: 3.1 pg/mL (ref 2.0–4.4)

## 2024-09-24 ENCOUNTER — Encounter: Payer: Self-pay | Admitting: "Endocrinology

## 2024-09-24 ENCOUNTER — Ambulatory Visit (INDEPENDENT_AMBULATORY_CARE_PROVIDER_SITE_OTHER): Admitting: "Endocrinology

## 2024-09-24 VITALS — BP 106/64 | HR 56 | Wt 157.4 lb

## 2024-09-24 DIAGNOSIS — R7989 Other specified abnormal findings of blood chemistry: Secondary | ICD-10-CM

## 2024-09-24 NOTE — Progress Notes (Signed)
 Endocrinology follow-up note                                             09/24/2024, 11:10 AM   Subjective:    Patient ID: Christopher [name], male    DOB: 08-Aug-1946, PCP Marvine Rush, MD   Past Medical History:  Diagnosis Date   A-fib [facility])    Arthritis    all over my body (06/17/2017)   BPH (benign prostatic hyperplasia)    takes Proscar  daily   CAD (coronary artery disease)    a. s/p CABG in 09/2019 with SVG-PDA-OM and LIMA-LAD with LAA excision   Chronic back pain    DDD; upper and lower (06/17/2017)   Constipation    takes Colace daily   Coronary artery disease    DDD (degenerative disc disease), cervical    DVT (deep venous thrombosis) (HCC)    left peroneal vein 07/2017 (in setting of recent left TKA)   Dysrhythmia    history of Atrial fibrillation, no long has this   Enlarged prostate    GERD (gastroesophageal reflux disease)    takes Nexium daily   H/O hiatal hernia    History of colon polyps    benign   History of gastric ulcer    Insomnia    takes Xanax  nightly   Joint capsule tear    left knee   Joint pain    Joint swelling    Macular degeneration    Muscle spasm of back    takes Valium  daily as needed   PAF (paroxysmal atrial fibrillation) (HCC)    Peripheral neuropathy    takes Gabapentin  daily   PONV (postoperative nausea and vomiting)    Pre-diabetes    Sleep apnea    pt does not wear a cpap   Urinary hesitancy    Weakness    numbness and tingling   Past Surgical History:  Procedure Laterality Date   ANTERIOR CERVICAL DECOMP/DISCECTOMY FUSION  ~ 1991   took bone from hip   ANTERIOR CERVICAL DECOMP/DISCECTOMY FUSION  X 2   put plate in   ANTERIOR CERVICAL DISCECTOMY  <11/2005    5-6 and 4-5./notes 05/02/2011   APPENDECTOMY     BACK SURGERY     BIOPSY  05/30/2018   Procedure: BIOPSY;  Surgeon: Golda Claudis PENNER, MD;  [facility];  Service: Endoscopy;;  esophagus   BIOPSY  01/09/2022   Procedure: BIOPSY;  Surgeon:  Eartha Angelia Sieving, MD;  Location: [facility];  Service: Gastroenterology;;   CARDIAC CATHETERIZATION     3-4 per pt   CARPAL TUNNEL RELEASE Bilateral 10/2005 - 11/2005   right-left /notes 05/02/2011   CATARACT EXTRACTION W/PHACO Right 05/24/2014   Procedure: CATARACT EXTRACTION PHACO AND INTRAOCULAR LENS PLACEMENT (IOC);  Surgeon: Cherene Mania, MD;  Location: [facility];  Service: Ophthalmology;  Laterality: Right;  CDE 6.74   CATARACT EXTRACTION W/PHACO Left 06/17/2014   Procedure: CATARACT EXTRACTION PHACO AND INTRAOCULAR LENS PLACEMENT (IOC);  Surgeon: Cherene Mania, MD;  Location: [facility];  Service: Ophthalmology;  Laterality: Left;  CDE:4.65   COLONOSCOPY WITH ESOPHAGOGASTRODUODENOSCOPY (EGD) N/A 01/22/2013   Procedure: COLONOSCOPY WITH ESOPHAGOGASTRODUODENOSCOPY (EGD);  Surgeon: Claudis PENNER Golda, MD;  [facility]: [facility];  Service: Endoscopy;  Laterality: N/A;  140   COLONOSCOPY WITH PROPOFOL  N/A 05/30/2018  diverticulosis and external hemorrhoids   CORONARY ARTERY BYPASS GRAFT  2020   CYSTOSCOPY WITH INSERTION OF UROLIFT N/A 04/16/2022   Procedure: CYSTOSCOPY WITH INSERTION OF UROLIFT;  Surgeon: Sherrilee Belvie CROME, MD;  Location: [facility];  Service: Urology;  Laterality: N/A;   ESOPHAGOGASTRODUODENOSCOPY (EGD) WITH PROPOFOL  N/A 05/30/2018   normal esophagus, presence of possible barretts (bx consistent with reflux changes, GAVE)   ESOPHAGOGASTRODUODENOSCOPY (EGD) WITH PROPOFOL  N/A 01/09/2022   Procedure: ESOPHAGOGASTRODUODENOSCOPY (EGD) WITH PROPOFOL ;  Surgeon: Eartha Angelia Sieving, MD;  [facility];  Service: Gastroenterology;  Laterality: N/A;  305, moved up per Leigh Amm   EYE SURGERY Bilateral    cleaned film w/laser; since my 1st cataract OR   JOINT REPLACEMENT     LEFT HEART CATH AND CORONARY ANGIOGRAPHY N/A 02/16/2019   Procedure: LEFT HEART CATH AND CORONARY ANGIOGRAPHY;  Surgeon: Claudene Victory ORN, MD;  [facility]: [facility];  Service: Cardiovascular;   Laterality: N/A;   POSTERIOR LUMBAR FUSION     put a plate in   QUADRICEPS TENDON REPAIR Left 07/01/2017   QUADRICEPS TENDON REPAIR Left 07/01/2017   Procedure: REPAIR QUADRICEP TENDON;  Surgeon: Rubie Kemps, MD;  Location: [facility] OR;  Service: Orthopedics;  Laterality: Left;   SHOULDER ARTHROSCOPY Bilateral    SPINAL CORD STIMULATOR INSERTION N/A 09/23/2015   Procedure: LUMBAR SPINAL CORD STIMULATOR INSERTION;  Surgeon: Deward Fabian, MD;  Location: [facility];  Service: Neurosurgery;  Laterality: N/A;  LUMBAR SPINAL CORD STIMULATOR INSERTION   SPINAL CORD STIMULATOR INSERTION N/A 04/25/2022   Procedure: REVISION OF  SPINAL CORD STIMULATOR;  Surgeon: Mavis Purchase, MD;  Location: [facility] OR;  Service: Neurosurgery;  Laterality: N/A;   TOTAL KNEE ARTHROPLASTY Left 06/17/2017   TOTAL KNEE ARTHROPLASTY Left 06/17/2017   Procedure: TOTAL KNEE ARTHROPLASTY;  Surgeon: Rubie Kemps, MD;  Location: [facility];  Service: Orthopedics;  Laterality: Left;   TOTAL KNEE ARTHROPLASTY Right 12/16/2017   Procedure: TOTAL KNEE ARTHROPLASTY;  Surgeon: Rubie Kemps, MD;  Location: [facility] OR;  Service: Orthopedics;  Laterality: Right;   Social History   Socioeconomic History   Marital status: Divorced    Spouse name: Not on file   Number of children: 3   Years of education: Not on file   Highest education level: Not on file  Occupational History   Not on file  Tobacco Use   Smoking status: Former    Current packs/day: 0.00    Average packs/day: 1 pack/day for 30.0 years (30.0 ttl pk-yrs)    Types: Cigarettes    Start date: 01/14/1957    Quit date: 01/14/1987    Years since quitting: 37.7   Smokeless tobacco: Former    Types: Chew   Tobacco comments:    quit smoking  & chewing by 1987  Vaping Use   Vaping status: Never Used  Substance and Sexual Activity   Alcohol use: Yes    Comment: 2-3 beers per night   Drug use: No   Sexual activity: Not Currently  Other Topics Concern   Not on file  Social History  Narrative   Not on file   Social Drivers of Health   Financial Resource Strain: Low Risk  (02/27/2023)   Received from [facility]   Overall Financial Resource Strain (CARDIA)    Difficulty of Paying Living Expenses: Not hard at all  Food Insecurity: No Food Insecurity (02/27/2023)   Received from [facility]   Hunger Vital Sign    Within the past  12 months, you worried that your food would run out before you got the money to buy more.: Never true    Within the past 12 months, the food you bought just didn't last and you didn't have money to get more.: Never true  Transportation Needs: No Transportation Needs (02/27/2023)   Received from [facility] - Transportation    Lack of Transportation (Medical): No    Lack of Transportation (Non-Medical): No  Physical Activity: Not on file  Stress: Not on file  Social Connections: Unknown (09/24/2022)   Received from [facility]   Social Network    Social Network: Not on file   History reviewed. No pertinent family history. Outpatient Encounter Medications as of 09/24/2024  Medication Sig   ALPRAZolam  (XANAX ) 1 MG tablet Take 1 mg by mouth at bedtime.   amiodarone  (PACERONE ) 200 MG tablet TAKE 1 TABLET BY MOUTH EVERY DAY   apixaban  (ELIQUIS ) 5 MG TABS tablet Take 1 tablet (5 mg total) by mouth 2 (two) times daily. *Restart 04/28/2022* (Patient taking differently: Take 5 mg by mouth 2 (two) times daily.)   ascorbic acid (VITAMIN C) 500 MG tablet Take 500 mg by mouth daily.   aspirin  EC 81 MG tablet Take 1 tablet (81 mg total) by mouth daily.   atorvastatin  (LIPITOR ) 80 MG tablet TAKE 1 TABLET BY MOUTH EVERY DAY   Calcium  Polycarbophil (FIBER-CAPS PO) Take 2 capsules by mouth daily.   Cholecalciferol  (VITAMIN D3) 10 MCG (400 UNIT) CAPS Take 400 Units by mouth daily.   Cyanocobalamin (B-12 PO) Take 1 tablet by mouth daily.   docusate sodium  (COLACE) 250 MG capsule Take 250 mg by mouth daily.   doxycycline  (VIBRAMYCIN ) 100 MG  capsule TAKE 1 CAPSULE BY MOUTH EVERY 12 HOURS   finasteride  (PROSCAR ) 5 MG tablet TAKE 1 TABLET (5 MG TOTAL) BY MOUTH DAILY.   furosemide  (LASIX ) 20 MG tablet Take 1 tablet (20 mg total) by mouth daily.   gabapentin  (NEURONTIN ) 400 MG capsule One three times a day (Patient taking differently: Take 400 mg by mouth 4 (four) times daily.)   isosorbide  mononitrate (IMDUR ) 60 MG 24 hr tablet Take 60 mg by mouth daily.   lidocaine  (LIDODERM ) 5 % Place 1 patch onto the skin daily. Remove & Discard patch within 12 hours or as directed by MD   magnesium  citrate SOLN Take 296 mLs (1 Bottle total) by mouth as needed for severe constipation.   nitroGLYCERIN  (NITROSTAT ) 0.4 MG SL tablet Place 1 tablet (0.4 mg total) under the tongue every 5 (five) minutes as needed for chest pain.   Omega-3 1000 MG CAPS Take 1,000 mg by mouth daily.   pantoprazole  (PROTONIX ) 40 MG tablet TAKE 1 TABLET BY MOUTH TWICE A DAY (30 TO 60 MINUTES BEFORE 1ST AND LAST MEALS OF THE DAY) (Patient taking differently: 40 mg 2 (two) times daily. (30 TO 60 MINUTES BEFORE 1ST AND LAST MEALS OF THE DAY))   polyethylene glycol powder (GLYCOLAX /MIRALAX ) 17 GM/SCOOP powder Take 17 g by mouth in the morning and at bedtime.   potassium chloride  (KLOR-CON ) 10 MEQ tablet Take 10 mEq by mouth daily.   silodosin  (RAPAFLO ) 8 MG CAPS capsule TAKE 1 CAPSULE (8 MG TOTAL) BY MOUTH AT BEDTIME.   Simethicone  180 MG CAPS Take 1 capsule by mouth 3 (three) times daily.   sulfamethoxazole -trimethoprim  (BACTRIM  DS) 800-160 MG tablet TAKE 1 TABLET BY MOUTH EVERY 12 HOURS   traMADol  (ULTRAM ) 50 MG tablet Take 1-2 tablets (  50-100 mg total) by mouth every 4 (four) hours as needed.   trimethoprim  (TRIMPEX ) 100 MG tablet Take 1 tablet (100 mg total) by mouth at bedtime.   vitamin E 200 UNIT capsule Take 200 Units by mouth daily.   Zinc  Sulfate (ZINC  15 PO) Take 15 mg by mouth daily.   No facility-administered encounter medications on file as of 09/24/2024.    ALLERGIES: Allergies  Allergen Reactions   Tylenol  [Acetaminophen ] Other (See Comments)    Stomach pain    Morphine And Codeine Nausea And Vomiting   Propofol  Nausea And Vomiting    VACCINATION STATUS: Immunization History  Administered Date(s) Administered   Fluad Quad(high Dose 65+) 12/19/2020   INFLUENZA, HIGH DOSE SEASONAL PF 10/09/2019   Influenza Inj Mdck Quad Pf 11/05/2018   Influenza,inj,quad, With Preservative 12/20/2018   Pneumococcal Conjugate-13 10/09/2019   Tdap 07/22/2019    HPI DELQUAN POUCHER is 78 y.o. male who presents today with a medical history as above. he is being seen in follow up after he was seen in consultation for hyperthyroidism requested by Marvine Rush, MD.   Patient is not optimal historian, and hard of hearing.  He remains on amiodarone  200 mg p.o. daily for cardiac dysrhythmia.  He continues to benefit from this intervention.  Previously, he did have abnormal thyroid  function test documented, subsequent workup showed significant thyroid  dysfunction.  Patient is on expectant management without intervention at this time.  He presents with steady body weight, denies palpitations, tremors, nor heat intolerance.   He denies any family history of thyroid  dysfunction or thyroid  malignancy. He is not on polypharmacy. He has multiple medical problems including coronary artery disease, hypertension, hyperlipidemia. Review of Systems  Constitutional: no recent weight gain/loss, no fatigue, no subjective hyperthermia, no subjective hypothermia Eyes: no blurry vision, no xerophthalmia ENT: no sore throat, no nodules palpated in throat, no dysphagia/odynophagia, no hoarseness   Objective:       09/24/2024   10:30 AM 09/10/2024   11:39 AM 09/10/2024   10:03 AM  Vitals with BMI  Weight 157 lbs 6 oz    Systolic 106 123 849  Diastolic 64 57 72  Pulse 56 55 60    BP 106/64   Pulse (!) 56   Wt 157 lb 6.4 oz (71.4 kg)   BMI 23.93 kg/m   Wt Readings  from Last 3 Encounters:  09/24/24 157 lb 6.4 oz (71.4 kg)  11/01/23 164 lb (74.4 kg)  09/11/23 161 lb 9.6 oz (73.3 kg)    Physical Exam  Constitutional:  Body mass index is 23.93 kg/m.,  not in acute distress, normal state of mind Eyes: PERRLA, EOMI, no exophthalmos ENT: moist mucous membranes, no gross thyromegaly, no gross cervical lymphadenopathy   CMP ( most recent) CMP     Component Value Date/Time   NA 141 04/28/2024 1321   NA 145 (H) 02/11/2019 1255   K 4.3 04/28/2024 1321   CL 104 04/28/2024 1321   CO2 25 04/28/2024 1321   GLUCOSE 95 04/28/2024 1321   BUN 14 04/28/2024 1321   BUN 10 02/11/2019 1255   CREATININE 1.11 04/28/2024 1321   CALCIUM  9.5 04/28/2024 1321   PROT 6.6 04/28/2024 1321   ALBUMIN 4.3 04/28/2024 1321   AST 38 04/28/2024 1321   ALT 32 04/28/2024 1321   ALKPHOS 105 04/28/2024 1321   BILITOT 0.5 04/28/2024 1321   GFRNONAA >60 04/28/2024 1321   GFRAA 101 02/11/2019 1255     Diabetic Labs (most  recent): Lab Results  Component Value Date   HGBA1C 5.6 12/31/2020     Lipid Panel ( most recent) Lipid Panel     Component Value Date/Time   CHOL 120 12/31/2020 2351   TRIG 45 12/31/2020 2351   HDL 53 12/31/2020 2351   CHOLHDL 2.3 12/31/2020 2351   VLDL 9 12/31/2020 2351   LDLCALC 58 12/31/2020 2351      Lab Results  Component Value Date   TSH 2.850 09/16/2024   TSH 2.460 09/10/2023   TSH 4.940 (H) 02/28/2023   TSH 2.010 01/09/2022   TSH 2.136 10/20/2020   TSH 2.40 08/23/2020   TSH 1.93 12/15/2018   TSH 2.035 03/05/2018   TSH 2.287 11/04/2017   TSH 2.656 07/22/2017   FREET4 0.98 09/16/2024   FREET4 1.64 09/10/2023   FREET4 1.50 02/28/2023   FREET4 0.83 03/06/2018   FREET4 0.88 11/04/2017        FINDINGS: Very low uptake within thyroid  gland by imaging. Thyroid  gland shape is faintly discern.   4 hour I-123 uptake = 1.2% (normal 5-20%), 24 hour I-123 uptake = 1.4% (normal 10-30%)   IMPRESSION: Decreased iodine  uptake by  imaging and direct measurement. Coupled with depressed TSH, findings suggestive of subacute thyroiditis. Recommend correlation   Assessment & Plan:   1. Subacute thyroiditis 2. Hearing loss, unspecified hearing loss type, unspecified laterality   - I have reviewed his available thyroid  records and clinically evaluated the patient.  He continues to have euthyroid presentation.  His previously documented transient thyroiditis has resolved, however he remains on amiodarone  200 mg p.o. daily. He will not need intervention at this time.  He will return in a year with thyroid  function tests for reevaluation.   Encouraged to continue his amiodarone  until assessment by his cardiologist.   - he is advised to maintain close follow up with Marvine Rush, MD for primary care needs.   I spent  20  minutes in the care of the patient today including review of labs from Thyroid  Function, CMP, and other relevant labs ; imaging/biopsy records (current and previous including abstractions from other facilities); face-to-face time discussing  his lab results and symptoms, medications doses, his options of short and long term treatment based on the latest standards of care / guidelines;   and documenting the encounter.  Christopher [name]  participated in the discussions, expressed understanding, and voiced agreement with the above plans.  All questions were answered to his satisfaction. he is encouraged to contact clinic should he have any questions or concerns prior to his return visit.   Follow up plan: Return in about 1 year (around 09/24/2025) for F/U with Pre-visit Labs.   Ranny Earl, MD [facility] [address] [place], [place] [address] Phone: [phone]  Fax: [phone]     09/24/2024, 11:10 AM  This note was partially dictated with voice recognition software. Similar sounding words can be transcribed inadequately or may not  be corrected  upon review.

## 2024-09-30 ENCOUNTER — Telehealth: Payer: Self-pay

## 2024-09-30 NOTE — Telephone Encounter (Signed)
   Pre-operative Risk Assessment    Patient Name: Christopher [name]  DOB: [birth year] MRN: [id number]   Date of last office visit: 05/30/23 DANELLE BIRMINGHAM, MD Date of next office visit: 10/09/24 DANELLE BIRMINGHAM, MD   Request for Surgical Clearance    Procedure:  CERVICAL EPIDURAL  Date of Surgery:  Clearance TBD                                Surgeon:  DR DEATRICE MANUS Surgeon's Group or Practice Name:  [facility] Phone number:  [phone] Fax number:  [phone]   Type of Clearance Requested:   - Medical  - Pharmacy:  Hold Apixaban  (Eliquis ) 3 DAYS PRIOR AND RESUME DAY AFTER AND ASPIRIN  ?   Type of Anesthesia:  Not Indicated   Additional requests/questions:    Signed, Lucie DELENA Ku   09/30/2024, 5:42 PM

## 2024-10-01 NOTE — Telephone Encounter (Signed)
 D/w the preop APP ok for appt 10/09/24 with Dr. Waddell for preop clearance.

## 2024-10-01 NOTE — Telephone Encounter (Signed)
   Name: Christopher [name]  DOB: 07-20-1946  MRN: [id number]  Primary Cardiologist: Maude Emmer, MD  Chart reviewed as part of pre-operative protocol coverage. Because of [name] past medical history and time since last visit, he will require a follow-up in-office visit in order to better assess preoperative cardiovascular risk.  Pre-op covering staff: - Please schedule appointment and call patient to inform them. If patient already had an upcoming appointment within acceptable timeframe, please add pre-op clearance to the appointment notes so provider is aware. - Please contact requesting surgeon's office via preferred method (i.e, phone, fax) to inform them of need for appointment prior to surgery.  This message will also be routed to pharmacy pool for input on holding Eliquis  as requested below so that this information is available to the clearing provider at time of patient's appointment.   Harwood Nall D Martyna Thorns, NP  10/01/2024, 7:47 AM

## 2024-10-06 NOTE — Telephone Encounter (Signed)
 Patient with diagnosis of Afib on Eliquis  for anticoagulation.    Procedure: CERVICAL EPIDURAL  Date of procedure: TBD   CHA2DS2-VASc Score = 3   This indicates a 3.2% annual risk of stroke. The patient's score is based upon: CHF History: 0 HTN History: 0 Diabetes History: 0 Stroke History: 0 Vascular Disease History: 1 Age Score: 2 Gender Score: 0    CrCl 48 mL/min  Platelet count 249 K  Patient has not had an Afib/aflutter ablation in the last 3 months, DCCV within the last 4 weeks or a watchman implanted in the last 45 days    Per office protocol, patient can hold Eliquis  for 3 days prior to procedure.   Patient will not need bridging with Lovenox (enoxaparin) around procedure.  **This guidance is not considered finalized until pre-operative APP has relayed final recommendations.**

## 2024-10-09 ENCOUNTER — Ambulatory Visit: Attending: Internal Medicine | Admitting: Internal Medicine

## 2024-10-12 ENCOUNTER — Encounter: Payer: Self-pay | Admitting: Internal Medicine

## 2024-10-27 NOTE — Telephone Encounter (Signed)
 Requesting office called in for an update on clearance request; appt is tomorrow 11/12 - Dr. Waddell will address

## 2024-10-28 ENCOUNTER — Ambulatory Visit: Attending: Internal Medicine | Admitting: Internal Medicine

## 2024-10-28 ENCOUNTER — Encounter: Payer: Self-pay | Admitting: Internal Medicine

## 2024-10-28 VITALS — BP 124/72 | HR 64 | Ht 68.0 in | Wt 155.0 lb

## 2024-10-28 DIAGNOSIS — R0609 Other forms of dyspnea: Secondary | ICD-10-CM | POA: Diagnosis not present

## 2024-10-28 DIAGNOSIS — I480 Paroxysmal atrial fibrillation: Secondary | ICD-10-CM

## 2024-10-28 DIAGNOSIS — R001 Bradycardia, unspecified: Secondary | ICD-10-CM

## 2024-10-28 NOTE — Progress Notes (Signed)
 HPI Christopher [name] returns today for followup. He is a pleasant 78 yo man with CAD s/p CABG, HTN, and chronic back pain s/p spinal stimulator. He c/o tingling on his face bilaterally which developed earlier today. He does not have any specific weakness or difficulty speaking. He is unclear about his medications. He also c/o chest pain when he lies down. Not related to walking or activity. He finally notes (20 min into our visit) that he has started to feel like he did before he had his bypass surgery with fatigue, sob and weakness. No angina.  Allergies  Allergen Reactions   Tylenol  [Acetaminophen ] Other (See Comments)    Stomach pain    Morphine And Codeine Nausea And Vomiting   Propofol  Nausea And Vomiting     Current Outpatient Medications  Medication Sig Dispense Refill   ALPRAZolam  (XANAX ) 1 MG tablet Take 1 mg by mouth at bedtime.  0   amiodarone  (PACERONE ) 200 MG tablet TAKE 1 TABLET BY MOUTH EVERY DAY 90 tablet 3   apixaban  (ELIQUIS ) 5 MG TABS tablet Take 1 tablet (5 mg total) by mouth 2 (two) times daily. *Restart 04/28/2022* (Patient taking differently: Take 5 mg by mouth 2 (two) times daily.) 60 tablet    ascorbic acid (VITAMIN C) 500 MG tablet Take 500 mg by mouth daily.     aspirin  EC 81 MG tablet Take 1 tablet (81 mg total) by mouth daily. 90 tablet 3   atorvastatin  (LIPITOR ) 80 MG tablet TAKE 1 TABLET BY MOUTH EVERY DAY 90 tablet 1   Calcium  Polycarbophil (FIBER-CAPS PO) Take 2 capsules by mouth daily.     Cholecalciferol  (VITAMIN D3) 10 MCG (400 UNIT) CAPS Take 400 Units by mouth daily.     Cyanocobalamin (B-12 PO) Take 1 tablet by mouth daily.     docusate sodium  (COLACE) 250 MG capsule Take 250 mg by mouth daily.     doxycycline  (VIBRAMYCIN ) 100 MG capsule TAKE 1 CAPSULE BY MOUTH EVERY 12 HOURS 56 capsule 0   finasteride  (PROSCAR ) 5 MG tablet TAKE 1 TABLET (5 MG TOTAL) BY MOUTH DAILY. 90 tablet 3   furosemide  (LASIX ) 20 MG tablet Take 1 tablet (20 mg total) by mouth  daily. 90 tablet 3   gabapentin  (NEURONTIN ) 400 MG capsule One three times a day (Patient taking differently: Take 400 mg by mouth 4 (four) times daily.)     isosorbide  mononitrate (IMDUR ) 60 MG 24 hr tablet Take 60 mg by mouth daily.     lidocaine  (LIDODERM ) 5 % Place 1 patch onto the skin daily. Remove & Discard patch within 12 hours or as directed by MD 14 patch 0   magnesium  citrate SOLN Take 296 mLs (1 Bottle total) by mouth as needed for severe constipation. 195 mL 2   nitroGLYCERIN  (NITROSTAT ) 0.4 MG SL tablet Place 1 tablet (0.4 mg total) under the tongue every 5 (five) minutes as needed for chest pain. 25 tablet 5   Omega-3 1000 MG CAPS Take 1,000 mg by mouth daily.     pantoprazole  (PROTONIX ) 40 MG tablet TAKE 1 TABLET BY MOUTH TWICE A DAY (30 TO 60 MINUTES BEFORE 1ST AND LAST MEALS OF THE DAY) (Patient taking differently: 40 mg 2 (two) times daily. (30 TO 60 MINUTES BEFORE 1ST AND LAST MEALS OF THE DAY)) 180 tablet 0   polyethylene glycol powder (GLYCOLAX /MIRALAX ) 17 GM/SCOOP powder Take 17 g by mouth in the morning and at bedtime. 255 g 1   potassium  chloride (KLOR-CON ) 10 MEQ tablet Take 10 mEq by mouth daily.     silodosin  (RAPAFLO ) 8 MG CAPS capsule TAKE 1 CAPSULE (8 MG TOTAL) BY MOUTH AT BEDTIME. 90 capsule 3   Simethicone  180 MG CAPS Take 1 capsule by mouth 3 (three) times daily.     sulfamethoxazole -trimethoprim  (BACTRIM  DS) 800-160 MG tablet TAKE 1 TABLET BY MOUTH EVERY 12 HOURS 42 tablet 0   traMADol  (ULTRAM ) 50 MG tablet Take 1-2 tablets (50-100 mg total) by mouth every 4 (four) hours as needed. 48 tablet 0   trimethoprim  (TRIMPEX ) 100 MG tablet Take 1 tablet (100 mg total) by mouth at bedtime. 30 tablet 11   vitamin E 200 UNIT capsule Take 200 Units by mouth daily.     Zinc  Sulfate (ZINC  15 PO) Take 15 mg by mouth daily.     No current facility-administered medications for this visit.     Past Medical History:  Diagnosis Date   A-fib [facility])    Arthritis    all over my  body (06/17/2017)   BPH (benign prostatic hyperplasia)    takes Proscar  daily   CAD (coronary artery disease)    a. s/p CABG in 09/2019 with SVG-PDA-OM and LIMA-LAD with LAA excision   Chronic back pain    DDD; upper and lower (06/17/2017)   Constipation    takes Colace daily   Coronary artery disease    DDD (degenerative disc disease), cervical    DVT (deep venous thrombosis) (HCC)    left peroneal vein 07/2017 (in setting of recent left TKA)   Dysrhythmia    history of Atrial fibrillation, no long has this   Enlarged prostate    GERD (gastroesophageal reflux disease)    takes Nexium daily   H/O hiatal hernia    History of colon polyps    benign   History of gastric ulcer    Insomnia    takes Xanax  nightly   Joint capsule tear    left knee   Joint pain    Joint swelling    Macular degeneration    Muscle spasm of back    takes Valium  daily as needed   PAF (paroxysmal atrial fibrillation) (HCC)    Peripheral neuropathy    takes Gabapentin  daily   PONV (postoperative nausea and vomiting)    Pre-diabetes    Sleep apnea    pt does not wear a cpap   Urinary hesitancy    Weakness    numbness and tingling    ROS:   All systems reviewed and negative except as noted in the HPI.   Past Surgical History:  Procedure Laterality Date   ANTERIOR CERVICAL DECOMP/DISCECTOMY FUSION  ~ 1991   took bone from hip   ANTERIOR CERVICAL DECOMP/DISCECTOMY FUSION  X 2   put plate in   ANTERIOR CERVICAL DISCECTOMY  <11/2005    5-6 and 4-5./notes 05/02/2011   APPENDECTOMY     BACK SURGERY     BIOPSY  05/30/2018   Procedure: BIOPSY;  Surgeon: Golda Claudis PENNER, MD;  [facility]: [facility];  Service: Endoscopy;;  esophagus   BIOPSY  01/09/2022   Procedure: BIOPSY;  Surgeon: Eartha Angelia Sieving, MD;  Location: [facility];  Service: Gastroenterology;;   CARDIAC CATHETERIZATION     3-4 per pt   CARPAL TUNNEL RELEASE Bilateral 10/2005 - 11/2005   right-left /notes 05/02/2011    CATARACT EXTRACTION W/PHACO Right 05/24/2014   Procedure: CATARACT EXTRACTION PHACO AND INTRAOCULAR LENS PLACEMENT (IOC);  Surgeon: Cherene Mania, MD;  Location: [facility];  Service: Ophthalmology;  Laterality: Right;  CDE 6.74   CATARACT EXTRACTION W/PHACO Left 06/17/2014   Procedure: CATARACT EXTRACTION PHACO AND INTRAOCULAR LENS PLACEMENT (IOC);  Surgeon: Cherene Mania, MD;  Location: [facility];  Service: Ophthalmology;  Laterality: Left;  CDE:4.65   COLONOSCOPY WITH ESOPHAGOGASTRODUODENOSCOPY (EGD) N/A 01/22/2013   Procedure: COLONOSCOPY WITH ESOPHAGOGASTRODUODENOSCOPY (EGD);  Surgeon: Claudis RAYMOND Rivet, MD;  Location: [facility];  Service: Endoscopy;  Laterality: N/A;  140   COLONOSCOPY WITH PROPOFOL  N/A 05/30/2018   diverticulosis and external hemorrhoids   CORONARY ARTERY BYPASS GRAFT  2020   CYSTOSCOPY WITH INSERTION OF UROLIFT N/A 04/16/2022   Procedure: CYSTOSCOPY WITH INSERTION OF UROLIFT;  Surgeon: Sherrilee Belvie CROME, MD;  Location: [facility];  Service: Urology;  Laterality: N/A;   ESOPHAGOGASTRODUODENOSCOPY (EGD) WITH PROPOFOL  N/A 05/30/2018   normal esophagus, presence of possible barretts (bx consistent with reflux changes, GAVE)   ESOPHAGOGASTRODUODENOSCOPY (EGD) WITH PROPOFOL  N/A 01/09/2022   Procedure: ESOPHAGOGASTRODUODENOSCOPY (EGD) WITH PROPOFOL ;  Surgeon: Eartha Angelia Sieving, MD;  [facility]: [facility];  Service: Gastroenterology;  Laterality: N/A;  305, moved up per Leigh Amm   EYE SURGERY Bilateral    cleaned film w/laser; since my 1st cataract OR   JOINT REPLACEMENT     LEFT HEART CATH AND CORONARY ANGIOGRAPHY N/A 02/16/2019   Procedure: LEFT HEART CATH AND CORONARY ANGIOGRAPHY;  Surgeon: Claudene Victory ORN, MD;  [facility]: [facility];  Service: Cardiovascular;  Laterality: N/A;   POSTERIOR LUMBAR FUSION     put a plate in   QUADRICEPS TENDON REPAIR Left 07/01/2017   QUADRICEPS TENDON REPAIR Left 07/01/2017   Procedure: REPAIR QUADRICEP TENDON;  Surgeon: Rubie Kemps,  MD;  Location: [facility];  Service: Orthopedics;  Laterality: Left;   SHOULDER ARTHROSCOPY Bilateral    SPINAL CORD STIMULATOR INSERTION N/A 09/23/2015   Procedure: LUMBAR SPINAL CORD STIMULATOR INSERTION;  Surgeon: Deward Fabian, MD;  Location: [facility];  Service: Neurosurgery;  Laterality: N/A;  LUMBAR SPINAL CORD STIMULATOR INSERTION   SPINAL CORD STIMULATOR INSERTION N/A 04/25/2022   Procedure: REVISION OF  SPINAL CORD STIMULATOR;  Surgeon: Mavis Purchase, MD;  Location: [facility] OR;  Service: Neurosurgery;  Laterality: N/A;   TOTAL KNEE ARTHROPLASTY Left 06/17/2017   TOTAL KNEE ARTHROPLASTY Left 06/17/2017   Procedure: TOTAL KNEE ARTHROPLASTY;  Surgeon: Rubie Kemps, MD;  Location: [facility];  Service: Orthopedics;  Laterality: Left;   TOTAL KNEE ARTHROPLASTY Right 12/16/2017   Procedure: TOTAL KNEE ARTHROPLASTY;  Surgeon: Rubie Kemps, MD;  Location: [facility];  Service: Orthopedics;  Laterality: Right;     History reviewed. No pertinent family history.   Social History   Socioeconomic History   Marital status: Divorced    Spouse name: Not on file   Number of children: 3   Years of education: Not on file   Highest education level: Not on file  Occupational History   Not on file  Tobacco Use   Smoking status: Former    Current packs/day: 0.00    Average packs/day: 1 pack/day for 30.0 years (30.0 ttl pk-yrs)    Types: Cigarettes    Start date: 01/14/1957    Quit date: 01/14/1987    Years since quitting: 37.8   Smokeless tobacco: Former    Types: Chew   Tobacco comments:    quit smoking  & chewing by 1987  Vaping Use   Vaping status: Never Used  Substance and Sexual Activity  Alcohol use: Yes    Comment: 2-3 beers per night   Drug use: No   Sexual activity: Not Currently  Other Topics Concern   Not on file  Social History Narrative   Not on file   Social Drivers of Health   Financial Resource Strain: Low Risk  (02/27/2023)   Received from [facility]   Overall Financial  Resource Strain (CARDIA)    Difficulty of Paying Living Expenses: Not hard at all  Food Insecurity: No Food Insecurity (02/27/2023)   Received from [facility]   Hunger Vital Sign    Within the past 12 months, you worried that your food would run out before you got the money to buy more.: Never true    Within the past 12 months, the food you bought just didn't last and you didn't have money to get more.: Never true  Transportation Needs: No Transportation Needs (02/27/2023)   Received from [facility] - Transportation    Lack of Transportation (Medical): No    Lack of Transportation (Non-Medical): No  Physical Activity: Not on file  Stress: Not on file  Social Connections: Unknown (09/24/2022)   [facility]   Social Network    Social Network: Not on file  Intimate Partner Violence: Unknown (09/24/2022)   Received from [facility]   HITS    Physically Hurt: Not on file    Insult or Talk Down To: Not on file    Threaten Physical Harm: Not on file    Scream or Curse: Not on file     BP 124/72 (BP Location: Left Arm, Patient Position: Sitting, Cuff Size: Normal)   Pulse 64   Ht 5' 8 (1.727 m)   Wt 155 lb (70.3 kg)   SpO2 94%   BMI 23.57 kg/m   Physical Exam:  Well appearing 78 yo man, NAD HEENT: Unremarkable Neck:  No JVD, no thyromegally Lymphatics:  No adenopathy Back:  No CVA tenderness Lungs:  Clear with no wheezes HEART:  Regular rate rhythm, no murmurs, no rubs, no clicks Abd:  soft, positive bowel sounds, no organomegally, no rebound, no guarding Ext:  2 plus pulses, no edema, no cyanosis, no clubbing Skin:  No rashes no nodules Neuro:  CN II through XII intact, motor grossly intact  EKG - nsr with noise artifact but no obvious STT changes.  Assess/Plan: Facial tingling - the etiology is unclear. I offered him a referral to the ED but he declines. He does not have any clear cut objective findings on exam. Sob/fatigue/weakness - he  notes that these were his symptoms before CABG. I have recommended he undergo lexiscan  stress testing. Positional chest pain - I doubt that these symptoms are angina. Most likely reflux as they occur only when he lies down. I encouraged maalox prn.   Danelle Clotiel Troop,MD

## 2024-10-28 NOTE — Patient Instructions (Addendum)
 Medication Instructions:  Your physician recommends that you continue on your current medications as directed. Please refer to the Current Medication list given to you today.  *If you need a refill on your cardiac medications before your next appointment, please call your pharmacy*  Lab Work: None ordered.  You may go to any [facility] Location for your lab work:  [facility] - [address] ([facility]) - [address] [phone] [address]  [place] - [address]   [place]  - [address]   [place] - [address] - [address] [place]  - [address] [place] - [address] ([facility]   If you have labs (blood work) drawn today and your tests are completely normal, you will receive your results only by: [employer] (if you have MyChart)  If you have any lab test that is abnormal or we need to change your treatment, we will call you or send a MyChart message to review the results.  Testing/Procedures: Your physician has requested that you have a lexiscan  myoview . For further information please visit [email or web address]   How to Prepare for Your Myoview  Test:        A. Nothing to eat or drink 3 hours prior to arrival time, except you may drink water        B. No Caffeine/Decaffeinated products or chocolate 12 hours prior to arrival.       C. No Cologne or Lotion       D. Wear comfortable walking shoes       E. Total time is 3 to 4 hours; you may want to bring reading material for the waiting time. If someone comes with you, they will need to remain in the lobby due to           limited space in the testing area.        F. Please report to [address] for your test.        G. Medication Instructions:   After You Arrive:  Once you arrive in the Nuclear Cardiology lab, an IV will be started, then the Technologist will inject a small  amount of radioactive tracer. There will be a 1 hour waiting period after this injection. A series of pictures will be taken of your heart following this waiting period. You will be prepped for the stress portion of the test. During the stress portion of your test a small amount of radioactive tracer will be injected in your IV. After the stress portion, there is a short rest period during which time your heart and blood pressure will be monitored. After the short test period the Technologist will begin your second set of pictures. Your doctor will inform you of your test results within 7 days.  In preparation for your appointment, medication, and supplies will be purchased. Appointment availability is limited, so if you need to cancel or reschedule please call the Nuclear Department at [phone], 24 hours in advance to avoid a cancellation fee of $50.00   Follow-Up: At [facility], you and your health needs are our priority.  As part of our continuing mission to provide you with exceptional heart care, we have created designated Provider Care Teams.  These Care Teams include your primary Cardiologist (physician) and Advanced Practice Providers (APPs -  Physician Assistants and Nurse Practitioners) who  all work together to provide you with the care you need, when you need it.  Your next appointment:   Based on testing

## 2024-10-29 ENCOUNTER — Other Ambulatory Visit: Payer: Self-pay | Admitting: Internal Medicine

## 2024-10-29 DIAGNOSIS — R0609 Other forms of dyspnea: Secondary | ICD-10-CM

## 2024-10-29 NOTE — Addendum Note (Signed)
 Addended by: Ameila Weldon N on: 10/29/2024 09:18 AM   Modules accepted: Orders

## 2024-10-30 ENCOUNTER — Ambulatory Visit (HOSPITAL_COMMUNITY)
Admission: RE | Admit: 2024-10-30 | Discharge: 2024-10-30 | Disposition: A | Discharge status: Home / Self Care | Source: Ambulatory Visit | Attending: Internal Medicine | Admitting: Internal Medicine

## 2024-10-30 DIAGNOSIS — R0609 Other forms of dyspnea: Secondary | ICD-10-CM | POA: Diagnosis present

## 2024-10-30 LAB — MYOCARDIAL PERFUSION IMAGING
LV dias vol: 78 mL (ref 62–150)
LV sys vol: 24 mL (ref 4.2–5.8)
Nuc Stress EF: 69 %
Peak HR: 70 {beats}/min
Rest HR: 55 {beats}/min
Rest Nuclear Isotope Dose: 10.6 mCi
SDS: 0
SRS: 0
SSS: 0
ST Depression (mm): 0 mm
Stress Nuclear Isotope Dose: 31.8 mCi
TID: 0.91

## 2024-10-30 MED ORDER — TECHNETIUM TC 99M TETROFOSMIN IV KIT
10.6000 | PACK | Freq: Once | INTRAVENOUS | Status: AC | PRN
  Administered 2024-10-30: 10.6 via INTRAVENOUS

## 2024-10-30 MED ORDER — REGADENOSON 0.4 MG/5ML IV SOLN
INTRAVENOUS | Status: AC
  Filled 2024-10-30: qty 5

## 2024-10-30 MED ORDER — TECHNETIUM TC 99M TETROFOSMIN IV KIT
31.8000 | PACK | Freq: Once | INTRAVENOUS | Status: AC | PRN
  Administered 2024-10-30: 31.8 via INTRAVENOUS

## 2024-10-30 MED ORDER — REGADENOSON 0.4 MG/5ML IV SOLN
0.4000 mg | Freq: Once | INTRAVENOUS | Status: AC
  Administered 2024-10-30: 0.4 mg via INTRAVENOUS

## 2024-11-01 ENCOUNTER — Ambulatory Visit: Payer: Self-pay | Admitting: Internal Medicine

## 2024-12-03 ENCOUNTER — Ambulatory Visit: Payer: Self-pay

## 2025-09-24 ENCOUNTER — Ambulatory Visit: Admitting: "Endocrinology
